# Patient Record
Sex: Female | Born: 1945 | Hispanic: No | Smoking: Former smoker
Health system: Southern US, Community
[De-identification: ages and names within clinical notes are randomized; demographics above are authoritative.]

## PROBLEM LIST (undated history)

## (undated) DIAGNOSIS — M549 Dorsalgia, unspecified: Secondary | ICD-10-CM

## (undated) DIAGNOSIS — M2550 Pain in unspecified joint: Secondary | ICD-10-CM

## (undated) DIAGNOSIS — I428 Other cardiomyopathies: Secondary | ICD-10-CM

## (undated) DIAGNOSIS — I252 Old myocardial infarction: Secondary | ICD-10-CM

## (undated) DIAGNOSIS — K5900 Constipation, unspecified: Secondary | ICD-10-CM

## (undated) DIAGNOSIS — M109 Gout, unspecified: Secondary | ICD-10-CM

## (undated) DIAGNOSIS — E039 Hypothyroidism, unspecified: Secondary | ICD-10-CM

## (undated) DIAGNOSIS — E559 Vitamin D deficiency, unspecified: Secondary | ICD-10-CM

## (undated) DIAGNOSIS — K219 Gastro-esophageal reflux disease without esophagitis: Secondary | ICD-10-CM

## (undated) DIAGNOSIS — M1990 Unspecified osteoarthritis, unspecified site: Secondary | ICD-10-CM

## (undated) DIAGNOSIS — E785 Hyperlipidemia, unspecified: Secondary | ICD-10-CM

## (undated) DIAGNOSIS — R7303 Prediabetes: Secondary | ICD-10-CM

## (undated) DIAGNOSIS — R0602 Shortness of breath: Secondary | ICD-10-CM

## (undated) DIAGNOSIS — C539 Malignant neoplasm of cervix uteri, unspecified: Secondary | ICD-10-CM

## (undated) DIAGNOSIS — C801 Malignant (primary) neoplasm, unspecified: Secondary | ICD-10-CM

## (undated) DIAGNOSIS — I2510 Atherosclerotic heart disease of native coronary artery without angina pectoris: Secondary | ICD-10-CM

## (undated) DIAGNOSIS — Z8489 Family history of other specified conditions: Secondary | ICD-10-CM

## (undated) DIAGNOSIS — I119 Hypertensive heart disease without heart failure: Secondary | ICD-10-CM

## (undated) DIAGNOSIS — G4730 Sleep apnea, unspecified: Secondary | ICD-10-CM

## (undated) DIAGNOSIS — R079 Chest pain, unspecified: Secondary | ICD-10-CM

## (undated) DIAGNOSIS — I480 Paroxysmal atrial fibrillation: Secondary | ICD-10-CM

## (undated) DIAGNOSIS — N189 Chronic kidney disease, unspecified: Secondary | ICD-10-CM

## (undated) HISTORY — DX: Dorsalgia, unspecified: M54.9

## (undated) HISTORY — DX: Old myocardial infarction: I25.2

## (undated) HISTORY — DX: Hyperlipidemia, unspecified: E78.5

## (undated) HISTORY — DX: Prediabetes: R73.03

## (undated) HISTORY — DX: Chest pain, unspecified: R07.9

## (undated) HISTORY — DX: Gout, unspecified: M10.9

## (undated) HISTORY — PX: CARDIAC CATHETERIZATION: SHX172

## (undated) HISTORY — DX: Vitamin D deficiency, unspecified: E55.9

## (undated) HISTORY — DX: Atherosclerotic heart disease of native coronary artery without angina pectoris: I25.10

## (undated) HISTORY — DX: Pain in unspecified joint: M25.50

## (undated) HISTORY — DX: Unspecified osteoarthritis, unspecified site: M19.90

## (undated) HISTORY — DX: Hypothyroidism, unspecified: E03.9

## (undated) HISTORY — DX: Shortness of breath: R06.02

## (undated) HISTORY — DX: Paroxysmal atrial fibrillation: I48.0

## (undated) HISTORY — DX: Constipation, unspecified: K59.00

## (undated) HISTORY — DX: Hypertensive heart disease without heart failure: I11.9

## (undated) HISTORY — DX: Other cardiomyopathies: I42.8

---
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
## 1975-12-11 DIAGNOSIS — C539 Malignant neoplasm of cervix uteri, unspecified: Secondary | ICD-10-CM

## 1975-12-11 HISTORY — DX: Malignant neoplasm of cervix uteri, unspecified: C53.9

## 1986-12-10 HISTORY — PX: ABDOMINAL HYSTERECTOMY: SHX81

## 1992-12-10 HISTORY — PX: BACK SURGERY: SHX140

## 1998-07-07 ENCOUNTER — Emergency Department (HOSPITAL_COMMUNITY): Admission: EM | Admit: 1998-07-07 | Discharge: 1998-07-07 | Payer: Self-pay | Admitting: Emergency Medicine

## 1999-03-10 ENCOUNTER — Ambulatory Visit (HOSPITAL_COMMUNITY): Admission: RE | Admit: 1999-03-10 | Discharge: 1999-03-10 | Payer: Self-pay | Admitting: Cardiology

## 1999-03-10 ENCOUNTER — Encounter: Payer: Self-pay | Admitting: Cardiology

## 2000-04-18 ENCOUNTER — Other Ambulatory Visit: Admission: RE | Admit: 2000-04-18 | Discharge: 2000-04-18 | Payer: Self-pay | Admitting: Internal Medicine

## 2000-10-06 ENCOUNTER — Encounter: Payer: Self-pay | Admitting: Emergency Medicine

## 2000-10-06 ENCOUNTER — Emergency Department (HOSPITAL_COMMUNITY): Admission: EM | Admit: 2000-10-06 | Discharge: 2000-10-07 | Payer: Self-pay | Admitting: Emergency Medicine

## 2000-10-07 ENCOUNTER — Encounter: Payer: Self-pay | Admitting: Emergency Medicine

## 2000-10-07 ENCOUNTER — Ambulatory Visit (HOSPITAL_COMMUNITY): Admission: RE | Admit: 2000-10-07 | Discharge: 2000-10-07 | Payer: Self-pay | Admitting: Internal Medicine

## 2000-10-24 ENCOUNTER — Encounter: Admission: RE | Admit: 2000-10-24 | Discharge: 2000-12-11 | Payer: Self-pay | Admitting: Internal Medicine

## 2001-10-01 ENCOUNTER — Emergency Department (HOSPITAL_COMMUNITY): Admission: EM | Admit: 2001-10-01 | Discharge: 2001-10-01 | Payer: Self-pay | Admitting: Emergency Medicine

## 2001-10-02 ENCOUNTER — Encounter: Payer: Self-pay | Admitting: Internal Medicine

## 2001-10-02 ENCOUNTER — Encounter: Admission: RE | Admit: 2001-10-02 | Discharge: 2001-10-02 | Payer: Self-pay | Admitting: Internal Medicine

## 2001-10-02 ENCOUNTER — Emergency Department (HOSPITAL_COMMUNITY): Admission: EM | Admit: 2001-10-02 | Discharge: 2001-10-02 | Payer: Self-pay | Admitting: Emergency Medicine

## 2002-08-12 ENCOUNTER — Other Ambulatory Visit: Admission: RE | Admit: 2002-08-12 | Discharge: 2002-08-12 | Payer: Self-pay | Admitting: Obstetrics and Gynecology

## 2002-12-01 ENCOUNTER — Encounter: Admission: RE | Admit: 2002-12-01 | Discharge: 2003-03-01 | Payer: Self-pay | Admitting: Obstetrics and Gynecology

## 2003-07-12 ENCOUNTER — Encounter: Payer: Self-pay | Admitting: Cardiology

## 2003-07-12 ENCOUNTER — Ambulatory Visit (HOSPITAL_COMMUNITY): Admission: RE | Admit: 2003-07-12 | Discharge: 2003-07-12 | Payer: Self-pay | Admitting: Cardiology

## 2003-07-14 ENCOUNTER — Encounter: Admission: RE | Admit: 2003-07-14 | Discharge: 2003-07-14 | Payer: Self-pay | Admitting: Cardiology

## 2003-07-14 ENCOUNTER — Encounter: Payer: Self-pay | Admitting: Cardiology

## 2004-08-08 ENCOUNTER — Emergency Department (HOSPITAL_COMMUNITY): Admission: EM | Admit: 2004-08-08 | Discharge: 2004-08-09 | Payer: Self-pay | Admitting: Emergency Medicine

## 2005-05-31 ENCOUNTER — Encounter: Admission: RE | Admit: 2005-05-31 | Discharge: 2005-05-31 | Payer: Self-pay | Admitting: Cardiology

## 2005-09-24 ENCOUNTER — Encounter: Admission: RE | Admit: 2005-09-24 | Discharge: 2005-09-24 | Payer: Self-pay | Admitting: Cardiology

## 2006-06-24 ENCOUNTER — Encounter: Admission: RE | Admit: 2006-06-24 | Discharge: 2006-06-24 | Payer: Self-pay | Admitting: Cardiology

## 2007-01-25 ENCOUNTER — Encounter: Admission: RE | Admit: 2007-01-25 | Discharge: 2007-01-25 | Payer: Self-pay | Admitting: Cardiology

## 2009-01-16 ENCOUNTER — Encounter: Admission: RE | Admit: 2009-01-16 | Discharge: 2009-01-16 | Payer: Self-pay | Admitting: Cardiology

## 2009-01-26 ENCOUNTER — Encounter (HOSPITAL_COMMUNITY): Admission: RE | Admit: 2009-01-26 | Discharge: 2009-04-26 | Payer: Self-pay | Admitting: Cardiology

## 2009-11-24 ENCOUNTER — Encounter (HOSPITAL_COMMUNITY): Admission: RE | Admit: 2009-11-24 | Discharge: 2010-01-10 | Payer: Self-pay | Admitting: Cardiology

## 2009-12-12 ENCOUNTER — Encounter: Admission: RE | Admit: 2009-12-12 | Discharge: 2009-12-12 | Payer: Self-pay | Admitting: Cardiology

## 2010-01-03 ENCOUNTER — Other Ambulatory Visit: Admission: RE | Admit: 2010-01-03 | Discharge: 2010-01-03 | Payer: Self-pay | Admitting: Interventional Radiology

## 2010-01-03 ENCOUNTER — Encounter: Admission: RE | Admit: 2010-01-03 | Discharge: 2010-01-03 | Payer: Self-pay | Admitting: Internal Medicine

## 2010-06-05 ENCOUNTER — Emergency Department (HOSPITAL_COMMUNITY): Admission: EM | Admit: 2010-06-05 | Discharge: 2010-06-06 | Payer: Self-pay | Admitting: Emergency Medicine

## 2010-09-15 ENCOUNTER — Encounter: Admission: RE | Admit: 2010-09-15 | Discharge: 2010-09-15 | Payer: Self-pay | Admitting: Cardiovascular Disease

## 2010-09-19 ENCOUNTER — Ambulatory Visit (HOSPITAL_COMMUNITY): Admission: RE | Admit: 2010-09-19 | Discharge: 2010-09-19 | Payer: Self-pay | Admitting: Cardiovascular Disease

## 2010-12-31 ENCOUNTER — Encounter: Payer: Self-pay | Admitting: Cardiology

## 2011-02-06 ENCOUNTER — Other Ambulatory Visit: Payer: Self-pay | Admitting: Internal Medicine

## 2011-02-06 DIAGNOSIS — E042 Nontoxic multinodular goiter: Secondary | ICD-10-CM

## 2011-02-07 ENCOUNTER — Ambulatory Visit (HOSPITAL_COMMUNITY)
Admission: RE | Admit: 2011-02-07 | Discharge: 2011-02-07 | Disposition: A | Discharge status: Home / Self Care | Payer: Medicare Other | Source: Ambulatory Visit | Attending: Internal Medicine | Admitting: Internal Medicine

## 2011-02-07 DIAGNOSIS — E042 Nontoxic multinodular goiter: Secondary | ICD-10-CM | POA: Insufficient documentation

## 2011-03-12 LAB — LIPID PANEL
Cholesterol: 220 mg/dL — ABNORMAL HIGH (ref 0–200)
HDL: 39 mg/dL — ABNORMAL LOW (ref >39)
LDL Cholesterol: 146 mg/dL — ABNORMAL HIGH (ref 0–99)
Total CHOL/HDL Ratio: 5.6 RATIO
Triglycerides: 175 mg/dL — ABNORMAL HIGH (ref <150)
VLDL: 35 mg/dL (ref 0–40)

## 2011-03-12 LAB — TSH: TSH: 0.525 u[IU]/mL (ref 0.350–4.500)

## 2011-03-12 LAB — T4: T4, Total: 8.6 ug/dL (ref 5.0–12.5)

## 2011-03-12 LAB — T3: T3, Total: 104 ng/dl (ref 80.0–204.0)

## 2012-08-14 ENCOUNTER — Other Ambulatory Visit: Payer: Self-pay | Admitting: Gastroenterology

## 2012-08-14 DIAGNOSIS — R9389 Abnormal findings on diagnostic imaging of other specified body structures: Secondary | ICD-10-CM

## 2012-09-26 ENCOUNTER — Other Ambulatory Visit: Payer: Medicare Other

## 2012-11-24 ENCOUNTER — Other Ambulatory Visit (HOSPITAL_COMMUNITY): Payer: Self-pay | Admitting: Cardiovascular Disease

## 2012-11-24 DIAGNOSIS — I119 Hypertensive heart disease without heart failure: Secondary | ICD-10-CM

## 2012-11-24 DIAGNOSIS — E039 Hypothyroidism, unspecified: Secondary | ICD-10-CM

## 2013-01-15 ENCOUNTER — Ambulatory Visit (HOSPITAL_COMMUNITY)
Admission: RE | Admit: 2013-01-15 | Discharge: 2013-01-15 | Disposition: A | Discharge status: Home / Self Care | Payer: Medicare Other | Source: Ambulatory Visit | Attending: Cardiovascular Disease | Admitting: Cardiovascular Disease

## 2013-01-15 ENCOUNTER — Other Ambulatory Visit: Payer: Self-pay | Admitting: Gastroenterology

## 2013-01-15 ENCOUNTER — Ambulatory Visit (HOSPITAL_COMMUNITY): Payer: Medicare Other

## 2013-01-15 DIAGNOSIS — I119 Hypertensive heart disease without heart failure: Secondary | ICD-10-CM | POA: Insufficient documentation

## 2013-01-15 DIAGNOSIS — I369 Nonrheumatic tricuspid valve disorder, unspecified: Secondary | ICD-10-CM | POA: Insufficient documentation

## 2013-01-15 DIAGNOSIS — E039 Hypothyroidism, unspecified: Secondary | ICD-10-CM

## 2013-01-15 DIAGNOSIS — Z800 Family history of malignant neoplasm of digestive organs: Secondary | ICD-10-CM

## 2013-01-15 DIAGNOSIS — I080 Rheumatic disorders of both mitral and aortic valves: Secondary | ICD-10-CM | POA: Insufficient documentation

## 2013-01-15 HISTORY — PX: TRANSTHORACIC ECHOCARDIOGRAM: SHX275

## 2013-01-15 NOTE — Progress Notes (Signed)
2D Echo Performed 01/15/2013    Latwan Luchsinger, RCS  

## 2013-01-26 ENCOUNTER — Ambulatory Visit
Admission: RE | Admit: 2013-01-26 | Discharge: 2013-01-26 | Disposition: A | Discharge status: Home / Self Care | Payer: Medicare Other | Source: Ambulatory Visit | Attending: Gastroenterology | Admitting: Gastroenterology

## 2013-01-26 ENCOUNTER — Other Ambulatory Visit: Payer: Self-pay | Admitting: Gastroenterology

## 2013-01-26 DIAGNOSIS — Z800 Family history of malignant neoplasm of digestive organs: Secondary | ICD-10-CM

## 2013-05-27 ENCOUNTER — Encounter (INDEPENDENT_AMBULATORY_CARE_PROVIDER_SITE_OTHER): Payer: Self-pay | Admitting: General Surgery

## 2013-05-27 ENCOUNTER — Ambulatory Visit (INDEPENDENT_AMBULATORY_CARE_PROVIDER_SITE_OTHER): Payer: Medicare Other | Admitting: General Surgery

## 2013-05-27 VITALS — BP 128/70 | HR 56 | Temp 97.0°F | Resp 16 | Ht 64.5 in | Wt 218.0 lb

## 2013-05-27 DIAGNOSIS — E669 Obesity, unspecified: Secondary | ICD-10-CM

## 2013-05-27 DIAGNOSIS — E785 Hyperlipidemia, unspecified: Secondary | ICD-10-CM

## 2013-05-27 DIAGNOSIS — E042 Nontoxic multinodular goiter: Secondary | ICD-10-CM

## 2013-05-27 DIAGNOSIS — I10 Essential (primary) hypertension: Secondary | ICD-10-CM

## 2013-05-27 NOTE — Progress Notes (Addendum)
Patient ID: Claudia Cantrell, female   DOB: 1946-11-25, 67 y.o.   MRN: [id number]  Chief Complaint  Patient presents with  . New Evaluation    eval multinodular goiter    HPI Claudia Cantrell is a 67 y.o. female.  She is referred by Dr. Lucianne Muss for evaluation of a large multinodular goiter with pressure symptoms. Dr. Trula Slade is her primary care physician.  She was diagnosed with a multinodular goiter in 2011. She has been having some dysphagia although has never been obstructed or had an impaction. Her voice is normal. She does not have any pain. She does have mild obstructive sleep apnea but doesn't have any wheezing or shortness of breath. Initial ultrasound in 2011 shows 3 cm nodule on the left. Smaller nodule on the left. Hypertrophied isthmus. Nodules were biopsied and are consistent with nonneoplastic goiter. She had a scan which showed some small hot nodule inferiorly on the right side, nonspecific..   Dr. Lucianne Muss does not think she has hyperthyroidism. CT scan at [facility] on 04/04/2011 shows marked enlargement of the left thyroid lobe and isthmus. The right lobe appeared normal. There were multiple hypodense nodular lesions throughout the thyroid. There were large coarse calcifications in the left lower gland. There is marked rightward tracheal deviation with mild transverse narrowing. Left-sided carotid vessels were deviated laterally. There was no adenopathy. The upper mediastinum is unremarkable.  There is no history of radiation therapy to the neck. There is no family history to suggest multiple endocrine neoplasia.  Dr. Lucianne Muss has had her on Synthroid. Her TSH is  slightly low. He does not think thyroid suppression will be effective. He referred her for discussion of surgical intervention.  Comorbidities include mild obesity, hypertension, hyperlipidemia, thoracic vertebral surgery by Dr.Botero. . Total abdominal hysterectomy and BSO.  HPI  Past Medical History  Diagnosis Date  .  Arthritis   . Hyperlipidemia     Past Surgical History  Procedure Laterality Date  . Back surgery  1994  . Abdominal hysterectomy  1988    Family History  Problem Relation Age of Onset  . Heart disease Mother   . Diabetes Father     Social History History  Substance Use Topics  . Smoking status: Former Smoker    Quit date: 12/10/1990  . Smokeless tobacco: Never Used  . Alcohol Use: Yes     Comment: very rare    Allergies  Allergen Reactions  . Codeine Anxiety    Current Outpatient Prescriptions  Medication Sig Dispense Refill  . amLODipine-valsartan (EXFORGE) 10-320 MG per tablet Take 1 tablet by mouth daily.      Marland Kitchen aspirin 81 MG tablet Take 81 mg by mouth daily.      . Multiple Vitamin (MULTIVITAMIN WITH MINERALS) TABS Take 1 tablet by mouth daily.      Marland Kitchen SYNTHROID 50 MCG tablet        No current facility-administered medications for this visit.    Review of Systems Review of Systems  Constitutional: Negative for fever, chills and unexpected weight change.  HENT: Negative for hearing loss, congestion, sore throat, trouble swallowing and voice change.   Eyes: Negative for visual disturbance.  Respiratory: Positive for cough and choking. Negative for wheezing.   Cardiovascular: Negative for chest pain, palpitations and leg swelling.  Gastrointestinal: Negative for nausea, vomiting, abdominal pain, diarrhea, constipation, blood in stool, abdominal distention and anal bleeding.  Genitourinary: Negative for hematuria, vaginal bleeding and difficulty urinating.  Musculoskeletal: Negative for arthralgias.  Skin: Negative for rash and wound.  Neurological: Negative for seizures, syncope and headaches.  Hematological: Negative for adenopathy. Does not bruise/bleed easily.  Psychiatric/Behavioral: Negative for confusion.    Blood pressure 128/70, pulse 56, temperature 97 F (36.1 C), temperature source Temporal, resp. rate 16, height 5' 4.5" (1.638 m), weight 218 lb  (98.884 kg).  Physical Exam Physical Exam  Constitutional: She is oriented to person, place, and time. She appears well-developed and well-nourished. No distress.  HENT:  Head: Normocephalic and atraumatic.  Nose: Nose normal.  Mouth/Throat: No oropharyngeal exudate.  Eyes: Conjunctivae and EOM are normal. Pupils are equal, round, and reactive to light. Left eye exhibits no discharge. No scleral icterus.  Neck: Neck supple. No JVD present. Tracheal deviation present. Thyromegaly present.  Significant thyroid goiter. Most notably centrally in the isthmus and also a left. Trachea is deviated to the right. No adenopathy. The voice is normal. No stridor or wheezing.  Cardiovascular: Normal rate, regular rhythm, normal heart sounds and intact distal pulses.   No murmur heard. Pulmonary/Chest: Effort normal and breath sounds normal. No respiratory distress. She has no wheezes. She has no rales. She exhibits no tenderness.  Abdominal: Soft. Bowel sounds are normal. She exhibits no distension and no mass. There is no tenderness. There is no rebound and no guarding.  Pfannenstiel scar  Musculoskeletal: She exhibits no edema and no tenderness.  Vertical midline incision, midthoracic back. Well-healed.  Lymphadenopathy:    She has no cervical adenopathy.  Neurological: She is alert and oriented to person, place, and time. She exhibits normal muscle tone. Coordination normal.  Skin: Skin is warm. No rash noted. She is not diaphoretic. No erythema. No pallor.  Psychiatric: She has a normal mood and affect. Her behavior is normal. Judgment and thought content normal.    Data Reviewed Ultrasound, CT scan, cytology at, Dr. Remus Blake notes.  Assessment    Significant multinodular goiter with significant thyromegaly left lobe and isthmus causing tracheal deviation deviation and swallowing problems.   Mild obesity.  hypertension  Mild obstructive sleep apnea  Hyperlipidemia  History TAH and BSO     Plan    We had a long talk about her goiter, differential diagnoses, natural history. She is aware that this is probably benign, although I cannot completely exclude carcinoma. She is aware that this will probably continue to enlarge and she will need surgery at some point. She states she would like to have surgery this fall, perhaps in October.  We will tentatively plan to proceed with total thyroidectomy in October of this year.  She'll return to the office for a final preop check in September  I discussed the indications, details, techniques, and numerous risk of the surgery with her. She is aware of the risk of temporary or permanent hoarseness recurrent laryngeal nerve injury. She is aware of the risk of temporary or permanent hypocalcemia for parathyroid injury. Bleeding infection and other wound problems were discussed. All of her questions are answered. She understands all these issues. She agrees with this plan.       Angelia Mould. Derrell Lolling, M.D., [facility] General and Minimally invasive Surgery Breast and Colorectal Surgery Office:   [phone] Pager:   [phone]  05/27/2013, 10:57 AM

## 2013-05-27 NOTE — Patient Instructions (Signed)
You had a very large, multinodular goiter. This is probably not cancerous.  The goiter is causing pressure on your esophagus and trachea, causing some swallowing problems and deviation of the  trachea to the right.  We have discussed the option of surgery, and you have stated sthat youwould like to go ahead with total thyroidectomy in October.  You will be given an appointment to see Dr. Derrell Lolling in early September for a final preop check. At that time we will schedule your surgery at your convenience in October.     Thyroidectomy Thyroidectomy is the removal of part or all of your thyroid gland. Your thyroid gland is a butterfly-shaped gland at the base of your neck. It produces a substance called thyroid hormone, which regulates the physical and chemical processes that keep your body functioning and make energy available to your body (metabolism). The amount of thyroid gland tissue that is removed during a thyroidectomy depends on the reason for the procedure. Typically, if only a part of your gland is removed, enough thyroid gland tissue remains to maintain normal function. If your entire thyroid gland is removed or if the amount of thyroid gland tissue remaining is inadequate to maintain normal function, you will need life-long treatment with thyroid hormone on a daily basis. Thyroidectomy maybe performed when you have the following conditions:  Thyroid nodules. These are small, abnormal collections of tissue that form inside the thyroid gland. If these nodules begin to enlarge at a rapid rate, a sample of tissue from the nodule is taken through a needle and examined (needle biopsy). This is done to determine if the nodules are cancerous. Depending on the outcome of this exam, thyroidectomy may be necessary.  Thyroid cancer.  Goiter, which is an enlarged thyroid gland. All or part of the thyroid gland may be removed if the gland has become so large that it causes difficulty breathing or  swallowing.  Hyperthyroidism. This is when the thyroid gland produces too much thyroid hormone. Hypothyroidism can cause symptoms of fluctuating weight, intolerance to heat, irritability, shortness of breath, and chest pain. LET YOUR CAREGIVER KNOW ABOUT:   Allergies to food or medicine.  Medicines that you are taking, including vitamins, herbs, eyedrops, over-the-counter medicines, and creams.  Previous problems you have had with anesthetics or numbing medicines.  History of bleeding problems or blood clots.  Previous surgeries you have had.  Other health problems, including diabetes and kidney problems, you have had.  Possibility of pregnancy, if this applies. BEFORE THE PROCEDURE   Do not eat or drink anything, including water, for at least 6 hours before the procedure.  Ask your caregiver whether you should stop taking certain medicines before the day of the procedure. PROCEDURE  There are different ways that thyroidectomy is performed. For each type, you will be given a medicine to make you sleep (general anesthetic). The three main types of thyroidectomy are listed as follows:  Conventional thyroidectomy A cut (incision) in the center portion of your lower neck is made with a scalpel. Muscles below your skin are separated to gain access to your thyroid gland. Your thyroid gland is dissected from your windpipe (trachea). Often a drain is placed at the incision site to drain any blood that accumulates under the skin after the procedure. This drain will be removed before you go home. The wound from the incision should heal within 2 weeks.  Endoscopic thyroidectomy Small incisions are made in your lower neck. A small instrument (endoscope) is inserted under  your skin at the incision sites. The endoscope used for thyroidectomy consists of 2 flexible tubes. Inside one of the tubes is a video camera that is used to guide the Careers adviser. Tools to remove the thyroid gland, including a tool to  cut the gland (dissectors) and a suction device, are inserted through the other tube. The surgeon uses the dissectors to dissect the thyroid gland from the trachea and remove it.  Robotic thyroidectomy This procedure allows your thyroid gland to be removed through incisions in your armpit, your chest, or high in your neck. Instruments similar to endoscopes provide a 3-dimensional picture of the surgical site. Dissecting instruments are controlled by devices similar to joysticks. These devices allow more accurate manipulation of the instruments. After the blood supply to the gland is removed, the gland is cut into several pieces and removed through the incisions. RISKS AND COMPLICATIONS Complications associated with thyroidectomy are rare, but they can occur. Possible complications include:  A decrease in parathyroid hormone levels (hypoparathyroidism) Your parathyroid glands are located close behind your thyroid gland. They are responsible for maintaining calcium levels inthe body. If they are damaged or removed, levels of calcium in the blood become low and nerves become irritable, which can cause muscle spasms. Medicines are available to treat this.  Bacterial infection This can often be treated with medicines that kill bacteria (antibiotics).  Damage to your voice box nerves This could cause hoarseness or complete loss of voice.  Bleeding or airway obstruction. AFTER THE PROCEDURE   You will rest in the recovery room as you wake up.  When you first wake up, your throat may feel slightly sore.  You will not be allowed to eat or drink until instructed otherwise.  You will be taken to your hospital room. You will usually stay at the hospital for 1 or 2 nights.  If a drain is placed during the procedure, it usually is removed the next day.  You may have some mild neck pain.  Your voice may be weak. This usually is temporary. Document Released: 05/22/2001 Document Revised: 02/18/2012  Document Reviewed: 02/28/2011 [facility] Patient Information 2014 [place], [place].

## 2013-06-23 ENCOUNTER — Encounter (INDEPENDENT_AMBULATORY_CARE_PROVIDER_SITE_OTHER): Payer: Self-pay

## 2013-07-13 ENCOUNTER — Encounter (INDEPENDENT_AMBULATORY_CARE_PROVIDER_SITE_OTHER): Payer: Self-pay | Admitting: General Surgery

## 2013-07-31 ENCOUNTER — Emergency Department (HOSPITAL_COMMUNITY)
Admission: EM | Admit: 2013-07-31 | Discharge: 2013-07-31 | Disposition: A | Discharge status: Home / Self Care | Payer: PRIVATE HEALTH INSURANCE | Attending: Emergency Medicine | Admitting: Emergency Medicine

## 2013-07-31 ENCOUNTER — Emergency Department (HOSPITAL_COMMUNITY): Payer: PRIVATE HEALTH INSURANCE

## 2013-07-31 ENCOUNTER — Encounter (HOSPITAL_COMMUNITY): Payer: Self-pay | Admitting: *Deleted

## 2013-07-31 DIAGNOSIS — Y9389 Activity, other specified: Secondary | ICD-10-CM | POA: Insufficient documentation

## 2013-07-31 DIAGNOSIS — S93401A Sprain of unspecified ligament of right ankle, initial encounter: Secondary | ICD-10-CM

## 2013-07-31 DIAGNOSIS — Z7982 Long term (current) use of aspirin: Secondary | ICD-10-CM | POA: Insufficient documentation

## 2013-07-31 DIAGNOSIS — Z87891 Personal history of nicotine dependence: Secondary | ICD-10-CM | POA: Insufficient documentation

## 2013-07-31 DIAGNOSIS — E785 Hyperlipidemia, unspecified: Secondary | ICD-10-CM | POA: Insufficient documentation

## 2013-07-31 DIAGNOSIS — M1990 Unspecified osteoarthritis, unspecified site: Secondary | ICD-10-CM | POA: Insufficient documentation

## 2013-07-31 DIAGNOSIS — Y92009 Unspecified place in unspecified non-institutional (private) residence as the place of occurrence of the external cause: Secondary | ICD-10-CM | POA: Insufficient documentation

## 2013-07-31 DIAGNOSIS — X500XXA Overexertion from strenuous movement or load, initial encounter: Secondary | ICD-10-CM | POA: Insufficient documentation

## 2013-07-31 DIAGNOSIS — S93409A Sprain of unspecified ligament of unspecified ankle, initial encounter: Secondary | ICD-10-CM | POA: Insufficient documentation

## 2013-07-31 DIAGNOSIS — Z79899 Other long term (current) drug therapy: Secondary | ICD-10-CM | POA: Insufficient documentation

## 2013-07-31 MED ORDER — TRAMADOL HCL 50 MG PO TABS: 50.0000 mg | ORAL_TABLET | Freq: Four times a day (QID) | ORAL | Status: DC | PRN

## 2013-07-31 MED ORDER — IBUPROFEN 800 MG PO TABS
800.0000 mg | ORAL_TABLET | Freq: Once | ORAL | Status: AC
  Administered 2013-07-31: 800 mg via ORAL
  Filled 2013-07-31: qty 1

## 2013-07-31 NOTE — ED Notes (Signed)
Pt states she inverted her foot while getting out of bed, and hurt a "crunch". Pt walked on foot all day and awoke from sleep with the pain this am.  R lat ankle swollen and painful on palpation.  No discoloration noted.

## 2013-07-31 NOTE — ED Provider Notes (Signed)
CSN: [id number]     Arrival date & time 07/31/13  7829 History     First MD Initiated Contact with Patient 07/31/13 [phone]     No chief complaint on file.  (Consider location/radiation/quality/duration/timing/severity/associated sxs/prior Treatment) HPI  67 year old female presents for evaluations of ankle pain. Patient states she hopped out of bed yesterday morning to go to the bathroom. State she accidentally placed all of her weight on her right ankle and experienced an acute onset of sharp throbbing pain to the affected ankle. Pain initially 6/10. She was able to bear some weight on it throughout the day but this morning she was unable to bear any weight. Pain is currently 10 out of 10, sharp and throbbing, radiates up to her lower leg. She tries taking an aspirin, and use warm water with minimal improvement. She denies any pain to the hip, or knee she denies any numbness or weakness. No prior history of gout. Denies fever, chills, or rash. Patient drove herself here.    Past Medical History  Diagnosis Date  . Arthritis   . Hyperlipidemia    Past Surgical History  Procedure Laterality Date  . Back surgery  1994  . Abdominal hysterectomy  1988   Family History  Problem Relation Age of Onset  . Heart disease Mother   . Diabetes Father    History  Substance Use Topics  . Smoking status: Former Smoker    Quit date: 12/10/1990  . Smokeless tobacco: Never Used  . Alcohol Use: Yes     Comment: very rare   OB History   Grav Para Term Preterm Abortions TAB SAB Ect Mult Living                 Review of Systems  Constitutional: Negative for fever.  Musculoskeletal: Positive for joint swelling.  Neurological: Negative for numbness.    Allergies  Codeine  Home Medications   Current Outpatient Rx  Name  Route  Sig  Dispense  Refill  . amLODipine-valsartan (EXFORGE) 10-320 MG per tablet   Oral   Take 1 tablet by mouth daily.         Marland Kitchen aspirin 81 MG tablet   Oral  Take 81 mg by mouth daily.         . Multiple Vitamin (MULTIVITAMIN WITH MINERALS) TABS   Oral   Take 1 tablet by mouth daily.         Marland Kitchen SYNTHROID 50 MCG tablet                There were no vitals taken for this visit. Physical Exam  Nursing note and vitals reviewed. Constitutional: She appears well-nourished. No distress.  HENT:  Head: Atraumatic.  Eyes: Conjunctivae are normal.  Neck: Neck supple.  Musculoskeletal: She exhibits tenderness (Right ankle. Point tenderness overlying the lateral malleolus region with mild edema noted no hematoma. Decreased range of motion due to pain. Intact DP/PT pulses. Able to move all toes, with brisk cap refill. No obvious deformity).  Neurological: She is alert.  Skin: Skin is warm. No rash noted.  Psychiatric: She has a normal mood and affect.    ED Course   Procedures (including critical care time)  Patient presents with right ankle injury. Likely a sprain. However patient requests full x-ray. X-ray will obtain. Ibuprofen given for pain. Patient is otherwise neurovascularly intact. Low suspicion for gout, or septic arthritis.  7:56 AM X-ray shows soft tissue swelling without evidence of acute fractures or dislocation. Patient  will be given an ASO for ankle stability, she also requests a set of crutches which I'm happy to give. Instruction for appropriate crutches use given. Rice therapy discussed. Pain medication prescribed. Ortho follow as needed. Return precautions given.  Labs Reviewed - No data to display Dg Ankle Complete Right  07/31/2013   *RADIOLOGY REPORT*  Clinical Data: Recent ankle injury and pain  RIGHT ANKLE - COMPLETE 3+ VIEW  Comparison: None.  Findings: Generalized soft tissue swelling is noted but more marked in the lateral ankle.  No acute fracture or dislocation is seen.  IMPRESSION: Soft tissue swelling without acute bony abnormality.   Original Report Authenticated By: Alcide Clever, M.D.   1. Ankle sprain, right,  initial encounter     MDM  BP 153/84  Pulse 72  Temp(Src) 99.3 F (37.4 C) (Oral)  Resp 18  Ht 5\' 5"  (1.651 m)  SpO2 96%  I have reviewed nursing notes and vital signs. I personally reviewed the imaging tests through PACS system  I reviewed available ER/hospitalization records thought the EMR   Fayrene Helper, [place] 07/31/13 [id number]

## 2013-07-31 NOTE — ED Notes (Signed)
Ortho paged. 

## 2013-08-05 NOTE — ED Provider Notes (Signed)
Medical screening examination/treatment/procedure(s) were performed by non-physician practitioner and as supervising physician I was immediately available for consultation/collaboration.    Celene Kras, MD 08/05/13 [phone]

## 2013-10-05 ENCOUNTER — Ambulatory Visit (INDEPENDENT_AMBULATORY_CARE_PROVIDER_SITE_OTHER): Payer: Medicare Other | Admitting: General Surgery

## 2013-10-06 ENCOUNTER — Encounter (INDEPENDENT_AMBULATORY_CARE_PROVIDER_SITE_OTHER): Payer: Self-pay | Admitting: General Surgery

## 2013-10-08 ENCOUNTER — Ambulatory Visit: Payer: Medicare Other | Admitting: Cardiovascular Disease

## 2013-10-09 ENCOUNTER — Ambulatory Visit (INDEPENDENT_AMBULATORY_CARE_PROVIDER_SITE_OTHER): Payer: No Typology Code available for payment source | Admitting: Cardiovascular Disease

## 2013-10-09 ENCOUNTER — Encounter: Payer: Self-pay | Admitting: Cardiovascular Disease

## 2013-10-09 VITALS — BP 138/80 | HR 69 | Ht 65.0 in | Wt 219.2 lb

## 2013-10-09 DIAGNOSIS — I2510 Atherosclerotic heart disease of native coronary artery without angina pectoris: Secondary | ICD-10-CM

## 2013-10-09 DIAGNOSIS — G4733 Obstructive sleep apnea (adult) (pediatric): Secondary | ICD-10-CM

## 2013-10-09 DIAGNOSIS — E785 Hyperlipidemia, unspecified: Secondary | ICD-10-CM

## 2013-10-09 DIAGNOSIS — E669 Obesity, unspecified: Secondary | ICD-10-CM

## 2013-10-09 DIAGNOSIS — E042 Nontoxic multinodular goiter: Secondary | ICD-10-CM

## 2013-10-09 DIAGNOSIS — I10 Essential (primary) hypertension: Secondary | ICD-10-CM

## 2013-10-09 NOTE — Progress Notes (Signed)
Patient ID: Claudia Cantrell, female   DOB: Feb 10, 1946, 67 y.o.   MRN: [id number]     HPI: Claudia Cantrell is a 67 y.o. female who presents to the office today for one-year cardiology evaluation.  Mrs. Cassata suffered remote myocardial infarctions in 1991 and 1992 and at that time underwent PTCA by Dr. Meryl Crutch.  She does have a history of hypertension, hyperlipidemia, obstructive sleep apnea on CPAP therapy, obesity, and hypothyroidism with multinodular goiter. In October 2011 she underwent cardiac catheterization after she had experienced episodes of chest pain. Catheterization showed preserved LV contractility although the was a very small focal area of severe hypocontractility involving the mid inferior wall as well as low posterior lateral wall in this patient status post remote myocardial infarction approximately 20 years ago treated with PTCA. At the time of her catheterization she did not have significant carotid obstructive disease and only had mild 10-20% narrowing at the ostium of the LAD, 20% irregularity in the mid AV groove circumflex, and 20% mid RCA and 20% posterior lateral irregularities. Subsequently, she has done well from a cardiovascular standpoint.  She tells me her multinodular goiter has increased. She has seen Dr. Delane Ginger and may require surgery. From a cardiac standpoint he has done well. She denies recurrent chest pain or shortness of breath. With reference to her obstructive sleep apnea she uses her CPAP therapy 100% of the time. She tells me she recently had completes a laboratory drawn by Dr.Jerome Spruil.  Past Medical History  Diagnosis Date  . Hyperlipidemia   . Arthritis     rheumatoid    Past Surgical History  Procedure Laterality Date  . Back surgery  1994  . Abdominal hysterectomy  1988    Allergies  Allergen Reactions  . Codeine Anxiety    Current Outpatient Prescriptions  Medication Sig Dispense Refill  . amLODipine (NORVASC) 10 MG tablet  Take 1 tablet by mouth daily.      Marland Kitchen aspirin 81 MG tablet Take 81 mg by mouth daily.      Marland Kitchen BIOTIN PO Take 1 tablet by mouth daily. 10,000 mcg      . Multiple Vitamin (MULTIVITAMIN WITH MINERALS) TABS Take 1 tablet by mouth daily.      . naproxen sodium (ANAPROX) 220 MG tablet Take 220 mg by mouth as needed.      . rosuvastatin (CRESTOR) 20 MG tablet Take 20 mg by mouth daily.      . valsartan-hydrochlorothiazide (DIOVAN-HCT) 320-12.5 MG per tablet Take 1 tablet by mouth daily.       No current facility-administered medications for this visit.    History   Social History  . Marital Status: Divorced    Spouse Name: N/A    Number of Children: N/A  . Years of Education: N/A   Occupational History  . Not on file.   Social History Main Topics  . Smoking status: Former Smoker    Quit date: 12/10/1990  . Smokeless tobacco: Never Used  . Alcohol Use: Yes     Comment: very rare  . Drug Use: No  . Sexual Activity: Not on file   Other Topics Concern  . Not on file   Social History Narrative  . No narrative on file    Family History  Problem Relation Age of Onset  . Heart disease Mother   . Diabetes Father    Social history is notable that she is widowed and has 2 children plus one adopted child  as well as 2 grandchildren. She does not routinely exercise. There is no tobacco or alcohol use.  ROS is negative for fevers, chills or night sweats. She denies visual changes. She denies skin lesions. She denies shortness of breath. She does have slightly enlarging Courter with multinodular and she tells me they're now 9 nodules. She is unaware of palpitations. She denies wheezing. No sputum production or cough. She denies chest pain or anginal symptoms. She does have moderate obesity denies weight gain and admits to approximately 5 pound weight loss. She denies change in bowel bladder habits. She denies blood in her stool or urine. She denies claudication. She denies bulges. There is no  known diabetes. She does have mild hyperlipidemia. She has hypertension asthma triple drug therapy. She denies neurologic symptoms. Other comprehensive 12 point  system review is negative.  PE BP 138/80  Pulse 69  Ht 5\' 5"  (1.651 m)  Wt 219 lb 3.2 oz (99.428 kg)  BMI 36.48 kg/m2  General: Alert, oriented, no distress. Moderate obesity Skin: normal turgor, no rashes HEENT: Normocephalic, atraumatic. Pupils round and reactive; sclera anicteric;no lid lag.  Nose without nasal septal hypertrophy Mouth/Parynx benign; Mallinpatti scale 3 Neck: No JVD, no carotid briuts; multinodular palpable goiter Lungs: clear to ausculatation and percussion; no wheezing or rales Heart: RRR, s1 s2 normal 1/6 sem Abdomen: soft, nontender; no hepatosplenomehaly, BS+; abdominal aorta nontender and not dilated by palpation. Pulses 2+ Extremities: no clubbing cyanosis or edema, Homan's sign negative  Neurologic: grossly nonfocal Psychologic: normal affect and mood.  ECG: Sinus rhythm at 69 beats per minute. Normal intervals. Nonspecific T changes.  LABS:  BMET No results found for this basename: na, k, cl, co2, glucose, bun, creatinine, calcium, gfrnonaa, gfraa     Hepatic Function Panel  No results found for this basename: prot, albumin, ast, alt, alkphos, bilitot, bilidir, ibili     CBC No results found for this basename: wbc, rbc, hgb, hct, plt, mcv, mch, mchc, rdw, neutrabs, lymphsabs, monoabs, eosabs, basosabs     BNP No results found for this basename: probnp    Lipid Panel     Component Value Date/Time   CHOL  Value: 220        ATP III CLASSIFICATION:  <200     mg/dL   Desirable  409-811  mg/dL   Borderline High  >=914    mg/dL   High        HEMOLYZED SPECIMEN, RESULTS MAY BE AFFECTED* 11/24/2009 1208   TRIG 175* 11/24/2009 1208   HDL 39* 11/24/2009 1208   CHOLHDL 5.6 11/24/2009 1208   VLDL 35 11/24/2009 1208   LDLCALC  Value: 146        Total Cholesterol/HDL:CHD Risk Coronary Heart  Disease Risk Table                     Men   Women  1/2 Average Risk   3.4   3.3  Average Risk       5.0   4.4  2 X Average Risk   9.6   7.1  3 X Average Risk  23.4   11.0        Use the calculated Patient Ratio above and the CHD Risk Table to determine the patient's CHD Risk.        ATP III CLASSIFICATION (LDL):  <100     mg/dL   Optimal  782-956  mg/dL   Near or Above  Optimal  130-159  mg/dL   Borderline  161-096  mg/dL   High  >045     mg/dL   Very High* 40/98/1191 1208     RADIOLOGY: No results found.    ASSESSMENT AND PLAN:  Ms. Roise Emert is 67 year-old [place] American female who apparently suffered a remote myocardial infarction approximately 24 years ago and underwent PTCA by Dr. Daisy Floro. At her last catheterization in 2011 she did have a small focal wall motion abnormality in the inferior and posterior lateral wall concordant with his myocardial infarction. She has mild nonobstructive CAD. She or her blood pressure is well-controlled. She is on lipid-lowering therapy for cholesterol. She is 100% compliant with reference to obstructive sleep apnea and CPAP use. She has seen Dr. Derrell Lolling and is under consideration for surgery for multinodular increasing goiter.  I have given her clearance for cardiac standpoint to undergo the surgery. He'll try to obtain the results of the recent laboratory done by Dr. Shana Chute. I will see her in one year for cardiology reevaluation.    Lennette Bihari, MD, [facility]  10/09/2013 10:48 AM

## 2013-10-09 NOTE — Patient Instructions (Signed)
Your physician recommends that you schedule a follow-up appointment in: 1 YEAR. No changes were made today in your therapy. 

## 2013-10-13 ENCOUNTER — Encounter (INDEPENDENT_AMBULATORY_CARE_PROVIDER_SITE_OTHER): Payer: Self-pay | Admitting: General Surgery

## 2013-10-13 ENCOUNTER — Other Ambulatory Visit (INDEPENDENT_AMBULATORY_CARE_PROVIDER_SITE_OTHER): Payer: Self-pay | Admitting: General Surgery

## 2013-10-13 ENCOUNTER — Ambulatory Visit (INDEPENDENT_AMBULATORY_CARE_PROVIDER_SITE_OTHER): Payer: Medicare Other | Admitting: General Surgery

## 2013-10-13 ENCOUNTER — Encounter: Payer: Self-pay | Admitting: Cardiovascular Disease

## 2013-10-13 VITALS — BP 118/80 | HR 74 | Temp 98.0°F | Resp 16 | Ht 65.0 in | Wt 219.0 lb

## 2013-10-13 DIAGNOSIS — E042 Nontoxic multinodular goiter: Secondary | ICD-10-CM

## 2013-10-13 NOTE — Patient Instructions (Signed)
Your cardiac evaluation with Dr. Tresa Endo turned out very good. He said that we could go ahead with the thyroid surgery.  You will be scheduled for a total thyroidectomy at [facility] in the near future.  You will need to take thyroid hormone for the rest of your life following the surgery. Dr. Lucianne Muss and Dr. Nehemiah Settle will help you with regulating that.  Be sure to bring your CPAP machine to the hospital with you so it can be used immediately postop.      Thyroidectomy Thyroidectomy is the removal of part or all of your thyroid gland. Your thyroid gland is a butterfly-shaped gland at the base of your neck. It produces a substance called thyroid hormone, which regulates the physical and chemical processes that keep your body functioning and make energy available to your body (metabolism). The amount of thyroid gland tissue that is removed during a thyroidectomy depends on the reason for the procedure. Typically, if only a part of your gland is removed, enough thyroid gland tissue remains to maintain normal function. If your entire thyroid gland is removed or if the amount of thyroid gland tissue remaining is inadequate to maintain normal function, you will need life-long treatment with thyroid hormone on a daily basis. Thyroidectomy maybe performed when you have the following conditions:  Thyroid nodules. These are small, abnormal collections of tissue that form inside the thyroid gland. If these nodules begin to enlarge at a rapid rate, a sample of tissue from the nodule is taken through a needle and examined (needle biopsy). This is done to determine if the nodules are cancerous. Depending on the outcome of this exam, thyroidectomy may be necessary.  Thyroid cancer.  Goiter, which is an enlarged thyroid gland. All or part of the thyroid gland may be removed if the gland has become so large that it causes difficulty breathing or swallowing.  Hyperthyroidism. This is when the thyroid gland  produces too much thyroid hormone. Hypothyroidism can cause symptoms of fluctuating weight, intolerance to heat, irritability, shortness of breath, and chest pain. LET YOUR CAREGIVER KNOW ABOUT:   Allergies to food or medicine.  Medicines that you are taking, including vitamins, herbs, eyedrops, over-the-counter medicines, and creams.  Previous problems you have had with anesthetics or numbing medicines.  History of bleeding problems or blood clots.  Previous surgeries you have had.  Other health problems, including diabetes and kidney problems, you have had.  Possibility of pregnancy, if this applies. BEFORE THE PROCEDURE   Do not eat or drink anything, including water, for at least 6 hours before the procedure.  Ask your caregiver whether you should stop taking certain medicines before the day of the procedure. PROCEDURE  There are different ways that thyroidectomy is performed. For each type, you will be given a medicine to make you sleep (general anesthetic). The three main types of thyroidectomy are listed as follows:  Conventional thyroidectomy A cut (incision) in the center portion of your lower neck is made with a scalpel. Muscles below your skin are separated to gain access to your thyroid gland. Your thyroid gland is dissected from your windpipe (trachea). Often a drain is placed at the incision site to drain any blood that accumulates under the skin after the procedure. This drain will be removed before you go home. The wound from the incision should heal within 2 weeks.  Endoscopic thyroidectomy Small incisions are made in your lower neck. A small instrument (endoscope) is inserted under your skin at the incision  sites. The endoscope used for thyroidectomy consists of 2 flexible tubes. Inside one of the tubes is a video camera that is used to guide the Careers adviser. Tools to remove the thyroid gland, including a tool to cut the gland (dissectors) and a suction device, are inserted  through the other tube. The surgeon uses the dissectors to dissect the thyroid gland from the trachea and remove it.  Robotic thyroidectomy This procedure allows your thyroid gland to be removed through incisions in your armpit, your chest, or high in your neck. Instruments similar to endoscopes provide a 3-dimensional picture of the surgical site. Dissecting instruments are controlled by devices similar to joysticks. These devices allow more accurate manipulation of the instruments. After the blood supply to the gland is removed, the gland is cut into several pieces and removed through the incisions. RISKS AND COMPLICATIONS Complications associated with thyroidectomy are rare, but they can occur. Possible complications include:  A decrease in parathyroid hormone levels (hypoparathyroidism) Your parathyroid glands are located close behind your thyroid gland. They are responsible for maintaining calcium levels inthe body. If they are damaged or removed, levels of calcium in the blood become low and nerves become irritable, which can cause muscle spasms. Medicines are available to treat this.  Bacterial infection This can often be treated with medicines that kill bacteria (antibiotics).  Damage to your voice box nerves This could cause hoarseness or complete loss of voice.  Bleeding or airway obstruction. AFTER THE PROCEDURE   You will rest in the recovery room as you wake up.  When you first wake up, your throat may feel slightly sore.  You will not be allowed to eat or drink until instructed otherwise.  You will be taken to your hospital room. You will usually stay at the hospital for 1 or 2 nights.  If a drain is placed during the procedure, it usually is removed the next day.  You may have some mild neck pain.  Your voice may be weak. This usually is temporary. Document Released: 05/22/2001 Document Revised: 02/18/2012 Document Reviewed: 02/28/2011 [facility] Patient Information 2014  [place], [place].      Thyroidectomy Care After Refer to this sheet in the next few weeks. These instructions provide you with general information on caring for yourself after you leave the hospital. Your caregiver also may give you specific instructions. Your treatment has been planned according to the most current medical practices available, but problems sometimes occur. Call your caregiver if you have any problems or questions after your procedure. HOME CARE INSTRUCTIONS   It is normal to be sore for a few weeks following surgery. See your caregiver if your pain seems to be getting worse rather than better.  Only take over-the-counter or prescription medicines for pain, discomfort, or fever as directed by your caregiver. Avoid taking medicines that contain aspirin and ibuprofen because they increase the risk of bleeding.  Shower rather than bathe until instructed otherwise by your caregiver.  Change your bandages (dressings) as directed by your caregiver.  You may resume a normal diet and activities as directed by your caregiver.  Avoid lifting weight greater than 20 lb (9 kg) or participating in heavy exercise or contact sports for 10 days or as instructed by your caregiver.  Make an appointment to see your caregiver for stitch (suture) or staple removal. SEEK MEDICAL CARE IF:   You have increased bleeding from your wound.  You have redness, swelling, or increasing pain from your wound or in your neck.  There is pus coming from your wound.  You have an oral temperature above 102 F (38.9 C).  There is a bad smell coming from the wound or dressing.  You develop lightheadedness or feel faint.  You develop numbness, tingling, or muscle spasms in your arms, hands, feet, or face.  You have difficulty swallowing. SEEK IMMEDIATE MEDICAL CARE IF:   You develop a rash.  You have difficulty breathing.  You hear whistling noises that come from your chest.  You develop a cough  that becomes increasingly worse.  You develop any reaction or side effects to medicines given.  There is swelling in your neck.  You develop changes in speech or hoarseness, which is getting worse. MAKE SURE YOU:   Understand these instructions.  Will watch your condition.  Will get help right away if you are not doing well or get worse. Document Released: 06/15/2005 Document Revised: 02/18/2012 Document Reviewed: 02/02/2011 [facility] Patient Information 2014 [place], [place].

## 2013-10-13 NOTE — Progress Notes (Signed)
Patient ID: Claudia Cantrell, female   DOB: 1946-09-05, 67 y.o.   MRN: [id number]  Chief Complaint  Patient presents with  . Follow-up    discuss goiter sx    HPI Claudia Cantrell is a 67 y.o. female.  She returns for surgical planning of her thyroidectomy.  Her initial evaluation is summarized as follows:    She was referred by Dr. Lucianne Muss for evaluation of a large multinodular goiter with pressure symptoms. Dr. Trula Slade is her primary care physician.  She was diagnosed with a multinodular goiter in 2011. She has been having some dysphagia although has never been obstructed or had an impaction. Her voice is normal. She does not have any pain. She does have mild obstructive sleep apnea but doesn't have any wheezing or shortness of breath. Initial ultrasound in 2011 shows 3 cm nodule on the left. Smaller nodule on the left. Hypertrophied isthmus. Nodules were biopsied and are consistent with nonneoplastic goiter. She had a scan which showed some small hot nodule inferiorly on the right side, nonspecific.. Dr. Lucianne Muss does not think she has hyperthyroidism. CT scan at [facility] on 04/04/2011 shows marked enlargement of the left thyroid lobe and isthmus. The right lobe appeared normal. There were multiple hypodense nodular lesions throughout the thyroid. There were large coarse calcifications in the left lower gland. There is marked rightward tracheal deviation with mild transverse narrowing. Left-sided carotid vessels were deviated laterally. There was no adenopathy. The upper mediastinum is unremarkable.  There is no history of radiation therapy to the neck. There is no family history to suggest multiple endocrine neoplasia.  Dr. Lucianne Muss has stopped her Synthroid. Her TSH is slightly low. He does not think thyroid suppression will be effective. He referred her for discussion of surgical intervention.  Comorbidities include mild obesity, hypertension, hyperlipidemia, thoracic vertebral surgery by Dr.Botero. .  Total abdominal hysterectomy and BSO.   She has been evaluated by Dr. Nicki Guadalajara. He reviewed her history of myocardial infarction in 1990 and 1992 with PTCA by Dr. Daisy Floro. She has no cardiac symptoms. He cleared her for the thyroid surgery. She has also seen Dr. Shana Chute who encouraged her to go ahead with thyroid surgery.  Her symptoms or assistance same. No real voice changes but she does have dysphagia. She is concerned about the deviation of the trachea to the right.   HPI  Past Medical History  Diagnosis Date  . Hyperlipidemia   . Arthritis     rheumatoid    Past Surgical History  Procedure Laterality Date  . Back surgery  1994  . Abdominal hysterectomy  1988    Family History  Problem Relation Age of Onset  . Heart disease Mother   . Diabetes Father     Social History History  Substance Use Topics  . Smoking status: Former Smoker    Quit date: 12/10/1990  . Smokeless tobacco: Never Used  . Alcohol Use: Yes     Comment: very rare    Allergies  Allergen Reactions  . Codeine Anxiety    Current Outpatient Prescriptions  Medication Sig Dispense Refill  . amLODipine (NORVASC) 10 MG tablet Take 1 tablet by mouth daily.      Marland Kitchen aspirin 81 MG tablet Take 81 mg by mouth daily.      Marland Kitchen BIOTIN PO Take 1 tablet by mouth daily. 10,000 mcg      . Multiple Vitamin (MULTIVITAMIN WITH MINERALS) TABS Take 1 tablet by mouth daily.      Marland Kitchen  naproxen sodium (ANAPROX) 220 MG tablet Take 220 mg by mouth as needed.      . rosuvastatin (CRESTOR) 20 MG tablet Take 20 mg by mouth daily.      . valsartan-hydrochlorothiazide (DIOVAN-HCT) 320-12.5 MG per tablet Take 1 tablet by mouth daily.       No current facility-administered medications for this visit.    Review of Systems Review of Systems  Constitutional: Negative for fever, chills and unexpected weight change.  HENT: Negative for congestion, hearing loss, sore throat, trouble swallowing and voice change.   Eyes: Negative for  visual disturbance.  Respiratory: Negative for cough and wheezing.   Cardiovascular: Negative for chest pain, palpitations and leg swelling.  Gastrointestinal: Negative for nausea, vomiting, abdominal pain, diarrhea, constipation, blood in stool, abdominal distention and anal bleeding.  Genitourinary: Negative for hematuria, vaginal bleeding and difficulty urinating.  Musculoskeletal: Negative for arthralgias.  Skin: Negative for rash and wound.  Neurological: Negative for seizures, syncope and headaches.  Hematological: Negative for adenopathy. Does not bruise/bleed easily.  Psychiatric/Behavioral: Negative for confusion. The patient is nervous/anxious.     Blood pressure 118/80, pulse 74, temperature 98 F (36.7 C), temperature source Temporal, resp. rate 16, height 5\' 5"  (1.651 m), weight 219 lb (99.338 kg).  Physical Exam Physical Exam  Constitutional: She is oriented to person, place, and time. She appears well-developed and well-nourished. No distress.  BMI 36.44  HENT:  Head: Normocephalic and atraumatic.  Nose: Nose normal.  Mouth/Throat: No oropharyngeal exudate.  Eyes: Conjunctivae and EOM are normal. Pupils are equal, round, and reactive to light. Left eye exhibits no discharge. No scleral icterus.  Neck: Neck supple. No JVD present. Tracheal deviation present. Thyromegaly present.  Significant thyroid goiter. Most notable centrally in the isthmus also the left. Trachea deviated to right. No adenopathy. Voice is normal. No stridor. The bladder does not appear to have a substernal extension.  Cardiovascular: Normal rate, regular rhythm, normal heart sounds and intact distal pulses.   No murmur heard. Pulmonary/Chest: Effort normal and breath sounds normal. No respiratory distress. She has no wheezes. She has no rales. She exhibits no tenderness.  Abdominal: Soft. Bowel sounds are normal. She exhibits no distension and no mass. There is no tenderness. There is no rebound and no  guarding.  Pfannenstiel scar.  Musculoskeletal: She exhibits no edema and no tenderness.  Vertical midline incisional midthoracic back. Well-healed.  Lymphadenopathy:    She has no cervical adenopathy.  Neurological: She is alert and oriented to person, place, and time. She exhibits normal muscle tone. Coordination normal.  Skin: Skin is warm. No rash noted. She is not diaphoretic. No erythema. No pallor.  Psychiatric: She has a normal mood and affect. Her behavior is normal. Judgment and thought content normal.    Data Reviewed My notes. [facility] notes. Ultrasound. CT scan. Cytology.  Assessment    Significant multinodular goiter with significant thyromegaly left lobe and isthmus causing tracheal deviation deviation and swallowing problems.   Mild obesity.  hypertension  Mild obstructive sleep apnea  Hyperlipidemia  History TAH and BSO     Plan    We had another long talk about her goiter, differential diagnosis, natural history. She is aware that this is most likely benign although we cannot completely exclude carcinoma without removal. She knows that she needs to go ahead with surgery and will like to have this done. She is ready to schedule.  We will schedule for total thyroidectomy at [facility] hospital.   I discussed  the indications, details, techniques, and numerous risk of the surgery with her. She is aware of the risk of temporary or permanent hoarseness from recurrent laryngeal nerve injury. She is aware of the risk of temporary or permanent hypocalcemia for parathyroid injury. Bleeding, infection and other wound problems were discussed. All of her questions are answered. She understands all these issues. She agrees with this plan.        Angelia Mould. Derrell Lolling, M.D., [facility] General and Minimally invasive Surgery Breast and Colorectal Surgery Office:   [phone] Pager:   [phone]  10/13/2013, 1:54 PM

## 2013-11-09 ENCOUNTER — Telehealth: Payer: Self-pay | Admitting: Cardiovascular Disease

## 2013-11-09 NOTE — Telephone Encounter (Signed)
Please call-she said [facility] have not received the paperwork so she can get her C-Pac machine.

## 2013-11-09 NOTE — Telephone Encounter (Signed)
Please advise 

## 2013-11-10 ENCOUNTER — Telehealth: Payer: Self-pay | Admitting: *Deleted

## 2013-11-10 NOTE — Telephone Encounter (Signed)
Informed patient the referral has been done to [facility] to manage her CPAP therapy and supplies.

## 2013-11-10 NOTE — Telephone Encounter (Signed)
Spoke with patient to see if i can assist her. She informs me that she was trying to get her CPAP supplies from [facility] and was told that her paperwork has not been sent over. I left message for Tamela Oddi to check to see what Is needed for patient to get her supplies.

## 2013-11-10 NOTE — Telephone Encounter (Signed)
Faxed referral to manage patient's CPAP and supplies.

## 2013-11-12 ENCOUNTER — Encounter (HOSPITAL_COMMUNITY): Payer: Self-pay | Admitting: Pharmacy Technician

## 2013-11-17 ENCOUNTER — Ambulatory Visit (HOSPITAL_COMMUNITY)
Admission: RE | Admit: 2013-11-17 | Discharge: 2013-11-17 | Disposition: A | Discharge status: Home / Self Care | Payer: PRIVATE HEALTH INSURANCE | Source: Ambulatory Visit | Attending: General Surgery | Admitting: General Surgery

## 2013-11-17 ENCOUNTER — Encounter (HOSPITAL_COMMUNITY)
Admission: RE | Admit: 2013-11-17 | Discharge: 2013-11-17 | Disposition: A | Discharge status: Home / Self Care | Payer: PRIVATE HEALTH INSURANCE | Source: Ambulatory Visit | Attending: General Surgery | Admitting: General Surgery

## 2013-11-17 ENCOUNTER — Encounter (HOSPITAL_COMMUNITY): Payer: Self-pay

## 2013-11-17 DIAGNOSIS — I517 Cardiomegaly: Secondary | ICD-10-CM | POA: Insufficient documentation

## 2013-11-17 DIAGNOSIS — J398 Other specified diseases of upper respiratory tract: Secondary | ICD-10-CM | POA: Insufficient documentation

## 2013-11-17 DIAGNOSIS — Z01818 Encounter for other preprocedural examination: Secondary | ICD-10-CM | POA: Insufficient documentation

## 2013-11-17 DIAGNOSIS — Z01812 Encounter for preprocedural laboratory examination: Secondary | ICD-10-CM | POA: Insufficient documentation

## 2013-11-17 HISTORY — DX: Gastro-esophageal reflux disease without esophagitis: K21.9

## 2013-11-17 HISTORY — DX: Sleep apnea, unspecified: G47.30

## 2013-11-17 HISTORY — DX: Malignant (primary) neoplasm, unspecified: C80.1

## 2013-11-17 LAB — COMPREHENSIVE METABOLIC PANEL
ALT: 11 U/L (ref 0–35)
AST: 13 U/L (ref 0–37)
Albumin: 3.8 g/dL (ref 3.5–5.2)
CO2: 28 mEq/L (ref 19–32)
Calcium: 9.4 mg/dL (ref 8.4–10.5)
GFR calc non Af Amer: 73 mL/min — ABNORMAL LOW (ref >90)
Sodium: 140 mEq/L (ref 135–145)
Total Protein: 7.9 g/dL (ref 6.0–8.3)

## 2013-11-17 LAB — CBC WITH DIFFERENTIAL/PLATELET
Basophils Absolute: 0 10*3/uL (ref 0.0–0.1)
Eosinophils Relative: 6 % — ABNORMAL HIGH (ref 0–5)
Lymphocytes Relative: 36 % (ref 12–46)
MCV: 90.8 fL (ref 78.0–100.0)
Neutro Abs: 2.3 10*3/uL (ref 1.7–7.7)
Platelets: 267 10*3/uL (ref 150–400)
RDW: 13.9 % (ref 11.5–15.5)
WBC: 4.7 10*3/uL (ref 4.0–10.5)

## 2013-11-17 LAB — TSH: TSH: 1.261 u[IU]/mL (ref 0.350–4.500)

## 2013-11-17 NOTE — Pre-Procedure Instructions (Addendum)
Claudia Cantrell  11/17/2013   Your procedure is scheduled on:  11/24/13  Report to [facility] short stay admitting at 530 AM.  Call this number if you have problems the morning of surgery: [phone]   Remember:   Do not eat food or drink liquids after midnight.   Take these medicines the morning of surgery with A SIP OF WATER: amlodipine         STOP all herbel meds, nsaids (aleve,naproxen,advil,ibuprofen) 5 days prior to surgery including aspirin, vitamins    Do not wear jewelry, make-up or nail polish.  Do not wear lotions, powders, or perfumes. You may wear deodorant.  Do not shave 48 hours prior to surgery. Men may shave face and neck.  Do not bring valuables to the hospital.  [facility] is not responsible                  for any belongings or valuables.               Contacts, dentures or bridgework may not be worn into surgery.  Leave suitcase in the car. After surgery it may be brought to your room.  For patients admitted to the hospital, discharge time is determined by your                treatment team.               Patients discharged the day of surgery will not be allowed to drive  home.  Name and phone number of your driver:   Special Instructions: Shower using CHG 2 nights before surgery and the night before surgery.  If you shower the day of surgery use CHG.  Use special wash - you have one bottle of CHG for all showers.  You should use approximately 1/3 of the bottle for each shower.   Please read over the following fact sheets that you were given: Pain Booklet, Coughing and Deep Breathing and Surgical Site Infection Prevention

## 2013-11-22 NOTE — H&P (Signed)
Claudia Cantrell    MRN:  [id number]   Description: 67 year old female  Provider: Ernestene Mention, MD  Department: [facility]          Diagnoses      Goiter, nontoxic, multinodular    -  Primary      241.1             Reason for Visit      Follow-up      discuss goiter sx               Current Vitals - Last Recorded      BP Pulse Temp(Src) Resp Ht Wt      118/80 74 98 F (36.7 C) (Temporal) 16 5\' 5"  (1.651 m) 219 lb (99.338 kg)   BMI-  36.44 kg/m2                         History and Physical   Ernestene Mention, MD     Status: Signed            Patient ID: Claudia Cantrell, female   DOB: [birth year], 67 y.o.   MRN: [id number]    Chief Complaint   Patient presents with   .  Follow-up       discuss goiter sx             HPI Claudia Cantrell is a 67 y.o. female.  She returns for surgical planning of her thyroidectomy.   Her initial evaluation is summarized as follows:    She was referred by Dr. Lucianne Muss for evaluation of a large multinodular goiter with pressure symptoms. Dr. Trula Slade is her primary care physician.   She was diagnosed with a multinodular goiter in 2011. She has been having some dysphagia although has never been obstructed or had an impaction. Her voice is normal. She does not have any pain. She does have mild obstructive sleep apnea but doesn't have any wheezing or shortness of breath. Initial ultrasound in 2011 shows 3 cm nodule on the left. Smaller nodule on the left. Hypertrophied isthmus. Nodules were biopsied and are consistent with nonneoplastic goiter. She had a scan which showed some small hot nodule inferiorly on the right side, nonspecific.. Dr. Lucianne Muss does not think she has hyperthyroidism. CT scan at [facility] on 04/04/2011 shows marked enlargement of the left thyroid lobe and isthmus. The right lobe appeared normal. There were multiple hypodense nodular lesions throughout the thyroid. There were large coarse calcifications in the  left lower gland. There is marked rightward tracheal deviation with mild transverse narrowing. Left-sided carotid vessels were deviated laterally. There was no adenopathy. The upper mediastinum is unremarkable.   There is no history of radiation therapy to the neck. There is no family history to suggest multiple endocrine neoplasia.   Dr. Lucianne Muss has stopped her Synthroid. Her TSH is slightly low. He does not think thyroid suppression will be effective. He referred her for discussion of surgical intervention.   Comorbidities include mild obesity, hypertension, hyperlipidemia, thoracic vertebral surgery by Dr.Botero. . Total abdominal hysterectomy and BSO.    She has been evaluated by Dr. Nicki Guadalajara. He reviewed her history of myocardial infarction in 1990 and 1992 with PTCA by Dr. Daisy Floro. She has no cardiac symptoms. He cleared her for the thyroid surgery. She has also seen Dr. Shana Chute who encouraged her to go ahead with thyroid surgery.   Her symptoms are  unchanged.  . No real voice changes but she does have dysphagia. She is concerned about the deviation of the trachea to the right.         Past Medical History   Diagnosis  Date   .  Hyperlipidemia     .  Arthritis         rheumatoid         Past Surgical History   Procedure  Laterality  Date   .  Back surgery    1994   .  Abdominal hysterectomy    1988         Family History   Problem  Relation  Age of Onset   .  Heart disease  Mother     .  Diabetes  Father          Social History History   Substance Use Topics   .  Smoking status:  Former Smoker       Quit date:  12/10/1990   .  Smokeless tobacco:  Never Used   .  Alcohol Use:  Yes         Comment: very rare         Allergies   Allergen  Reactions   .  Codeine  Anxiety         Current Outpatient Prescriptions   Medication  Sig  Dispense  Refill   .  amLODipine (NORVASC) 10 MG tablet  Take 1 tablet by mouth daily.         Marland Kitchen  aspirin 81 MG tablet   Take 81 mg by mouth daily.         Marland Kitchen  BIOTIN PO  Take 1 tablet by mouth daily. 10,000 mcg         .  Multiple Vitamin (MULTIVITAMIN WITH MINERALS) TABS  Take 1 tablet by mouth daily.         .  naproxen sodium (ANAPROX) 220 MG tablet  Take 220 mg by mouth as needed.         .  rosuvastatin (CRESTOR) 20 MG tablet  Take 20 mg by mouth daily.         .  valsartan-hydrochlorothiazide (DIOVAN-HCT) 320-12.5 MG per tablet  Take 1 tablet by mouth daily.             No current facility-administered medications for this visit.        Review of Systems   Constitutional: Negative for fever, chills and unexpected weight change.  HENT: Negative for congestion, hearing loss, sore throat, trouble swallowing and voice change.   Eyes: Negative for visual disturbance.  Respiratory: Negative for cough and wheezing.   Cardiovascular: Negative for chest pain, palpitations and leg swelling.  Gastrointestinal: Negative for nausea, vomiting, abdominal pain, diarrhea, constipation, blood in stool, abdominal distention and anal bleeding.  Genitourinary: Negative for hematuria, vaginal bleeding and difficulty urinating.  Musculoskeletal: Negative for arthralgias.  Skin: Negative for rash and wound.  Neurological: Negative for seizures, syncope and headaches.  Hematological: Negative for adenopathy. Does not bruise/bleed easily.  Psychiatric/Behavioral: Negative for confusion. The patient is nervous/anxious.       Blood pressure 118/80, pulse 74, temperature 98 F (36.7 C), temperature source Temporal, resp. rate 16, height 5\' 5"  (1.651 m), weight 219 lb (99.338 kg).   Physical Exam  Constitutional: She is oriented to person, place, and time. She appears well-developed and well-nourished. No distress.  BMI 36.44  HENT:   Head: Normocephalic and atraumatic.  Nose: Nose normal.   Mouth/Throat: No oropharyngeal exudate.  Eyes: Conjunctivae and EOM are normal. Pupils are equal, round, and reactive to  light. Left eye exhibits no discharge. No scleral icterus.  Neck: Neck supple. No JVD present. Tracheal deviation present. Thyromegaly present.  Significant thyroid goiter. Most notable centrally in the isthmus also the left. Trachea deviated to right. No adenopathy. Voice is normal. No stridor. The bladder does not appear to have a substernal extension.  Cardiovascular: Normal rate, regular rhythm, normal heart sounds and intact distal pulses.    No murmur heard. Pulmonary/Chest: Effort normal and breath sounds normal. No respiratory distress. She has no wheezes. She has no rales. She exhibits no tenderness.  Abdominal: Soft. Bowel sounds are normal. She exhibits no distension and no mass. There is no tenderness. There is no rebound and no guarding.  Pfannenstiel scar.  Musculoskeletal: She exhibits no edema and no tenderness.  Vertical midline incisional midthoracic back. Well-healed.  Lymphadenopathy:    She has no cervical adenopathy.  Neurological: She is alert and oriented to person, place, and time. She exhibits normal muscle tone. Coordination normal.  Skin: Skin is warm. No rash noted. She is not diaphoretic. No erythema. No pallor.  Psychiatric: She has a normal mood and affect. Her behavior is normal. Judgment and thought content normal.      Data Reviewed My notes. [facility] notes. Ultrasound. CT scan. Cytology.   Assessment     Significant multinodular goiter with significant thyromegaly left lobe and isthmus causing tracheal deviation deviation and swallowing problems.    Mild obesity.   hypertension   Mild obstructive sleep apnea   Hyperlipidemia   History TAH and BSO       Plan    We had another long talk about her goiter, differential diagnosis, natural history. She is aware that this is most likely benign although we cannot completely exclude carcinoma without removal. She knows that she needs to go ahead with surgery and will like to have this done. She is  ready to schedule.   We will schedule for total thyroidectomy at [facility] hospital.    I discussed the indications, details, techniques, and numerous risk of the surgery with her. She is aware of the risk of temporary or permanent hoarseness from recurrent laryngeal nerve injury. She is aware of the risk of temporary or permanent hypocalcemia for parathyroid injury. Bleeding, infection and other wound problems were discussed. All of her questions are answered. She understands all these issues. She agrees with this plan.            Angelia Mould. Derrell Lolling, M.D., [facility] General and Minimally invasive Surgery Breast and Colorectal Surgery Office:   [phone] Pager:   [phone]

## 2013-11-23 MED ORDER — CEFAZOLIN SODIUM-DEXTROSE 2-3 GM-% IV SOLR
2.0000 g | INTRAVENOUS | Status: DC
  Filled 2013-11-23: qty 50

## 2013-11-24 ENCOUNTER — Encounter (HOSPITAL_COMMUNITY): Payer: Self-pay | Admitting: *Deleted

## 2013-11-24 ENCOUNTER — Encounter (HOSPITAL_COMMUNITY): Payer: PRIVATE HEALTH INSURANCE | Admitting: Anesthesiology

## 2013-11-24 ENCOUNTER — Encounter (HOSPITAL_COMMUNITY)
Admission: RE | Disposition: A | Discharge status: Home / Self Care | Payer: Self-pay | Source: Ambulatory Visit | Attending: General Surgery

## 2013-11-24 ENCOUNTER — Ambulatory Visit (HOSPITAL_COMMUNITY): Payer: PRIVATE HEALTH INSURANCE | Admitting: Anesthesiology

## 2013-11-24 ENCOUNTER — Ambulatory Visit (INDEPENDENT_AMBULATORY_CARE_PROVIDER_SITE_OTHER): Payer: Medicare Other | Admitting: General Surgery

## 2013-11-24 ENCOUNTER — Observation Stay (HOSPITAL_COMMUNITY)
Admission: RE | Admit: 2013-11-24 | Discharge: 2013-11-25 | Disposition: A | Discharge status: Home / Self Care | Payer: PRIVATE HEALTH INSURANCE | Source: Ambulatory Visit | Attending: General Surgery | Admitting: General Surgery

## 2013-11-24 DIAGNOSIS — R1310 Dysphagia, unspecified: Secondary | ICD-10-CM | POA: Insufficient documentation

## 2013-11-24 DIAGNOSIS — I10 Essential (primary) hypertension: Secondary | ICD-10-CM | POA: Insufficient documentation

## 2013-11-24 DIAGNOSIS — I209 Angina pectoris, unspecified: Secondary | ICD-10-CM | POA: Insufficient documentation

## 2013-11-24 DIAGNOSIS — E785 Hyperlipidemia, unspecified: Secondary | ICD-10-CM | POA: Insufficient documentation

## 2013-11-24 DIAGNOSIS — E669 Obesity, unspecified: Secondary | ICD-10-CM | POA: Insufficient documentation

## 2013-11-24 DIAGNOSIS — G4733 Obstructive sleep apnea (adult) (pediatric): Secondary | ICD-10-CM | POA: Insufficient documentation

## 2013-11-24 DIAGNOSIS — E042 Nontoxic multinodular goiter: Secondary | ICD-10-CM

## 2013-11-24 DIAGNOSIS — J398 Other specified diseases of upper respiratory tract: Secondary | ICD-10-CM | POA: Insufficient documentation

## 2013-11-24 DIAGNOSIS — I2510 Atherosclerotic heart disease of native coronary artery without angina pectoris: Secondary | ICD-10-CM | POA: Insufficient documentation

## 2013-11-24 DIAGNOSIS — Z23 Encounter for immunization: Secondary | ICD-10-CM | POA: Insufficient documentation

## 2013-11-24 DIAGNOSIS — I252 Old myocardial infarction: Secondary | ICD-10-CM | POA: Insufficient documentation

## 2013-11-24 DIAGNOSIS — K219 Gastro-esophageal reflux disease without esophagitis: Secondary | ICD-10-CM | POA: Insufficient documentation

## 2013-11-24 HISTORY — DX: Family history of other specified conditions: Z84.89

## 2013-11-24 HISTORY — PX: THYROIDECTOMY: SHX17

## 2013-11-24 SURGERY — THYROIDECTOMY
Anesthesia: General | Site: Neck

## 2013-11-24 MED ORDER — ASPIRIN 81 MG PO TABS: 81.0000 mg | ORAL_TABLET | Freq: Every day | ORAL | Status: DC

## 2013-11-24 MED ORDER — ARTIFICIAL TEARS OP OINT
TOPICAL_OINTMENT | OPHTHALMIC | Status: DC | PRN
  Administered 2013-11-24: 1 via OPHTHALMIC

## 2013-11-24 MED ORDER — GLYCOPYRROLATE 0.2 MG/ML IJ SOLN
INTRAMUSCULAR | Status: DC | PRN
  Administered 2013-11-24: 0.4 mg via INTRAVENOUS

## 2013-11-24 MED ORDER — INFLUENZA VAC SPLIT QUAD 0.5 ML IM SUSP
0.5000 mL | INTRAMUSCULAR | Status: AC
  Administered 2013-11-25: 0.5 mL via INTRAMUSCULAR
  Filled 2013-11-24 (×2): qty 0.5

## 2013-11-24 MED ORDER — FENTANYL CITRATE 0.05 MG/ML IJ SOLN
25.0000 ug | INTRAMUSCULAR | Status: DC | PRN
  Administered 2013-11-24 (×3): 50 ug via INTRAVENOUS

## 2013-11-24 MED ORDER — EPHEDRINE SULFATE 50 MG/ML IJ SOLN
INTRAMUSCULAR | Status: DC | PRN
  Administered 2013-11-24: 5 mg via INTRAVENOUS

## 2013-11-24 MED ORDER — ONDANSETRON HCL 4 MG PO TABS
4.0000 mg | ORAL_TABLET | Freq: Four times a day (QID) | ORAL | Status: DC | PRN
  Administered 2013-11-25: 4 mg via ORAL
  Filled 2013-11-24: qty 1

## 2013-11-24 MED ORDER — MIDAZOLAM HCL 5 MG/5ML IJ SOLN
INTRAMUSCULAR | Status: DC | PRN
  Administered 2013-11-24: 2 mg via INTRAVENOUS

## 2013-11-24 MED ORDER — ONDANSETRON HCL 4 MG/2ML IJ SOLN
4.0000 mg | Freq: Four times a day (QID) | INTRAMUSCULAR | Status: DC | PRN
  Administered 2013-11-24: 4 mg via INTRAVENOUS
  Filled 2013-11-24: qty 2

## 2013-11-24 MED ORDER — BUPIVACAINE-EPINEPHRINE (PF) 0.5% -1:200000 IJ SOLN
INTRAMUSCULAR | Status: AC
  Filled 2013-11-24: qty 10

## 2013-11-24 MED ORDER — PNEUMOCOCCAL VAC POLYVALENT 25 MCG/0.5ML IJ INJ
0.5000 mL | INJECTION | INTRAMUSCULAR | Status: AC
  Administered 2013-11-25: 0.5 mL via INTRAMUSCULAR
  Filled 2013-11-24 (×2): qty 0.5

## 2013-11-24 MED ORDER — HYDROCODONE-ACETAMINOPHEN 5-325 MG PO TABS
1.0000 | ORAL_TABLET | ORAL | Status: DC | PRN
  Administered 2013-11-24 – 2013-11-25 (×3): 2 via ORAL
  Filled 2013-11-24 (×3): qty 2

## 2013-11-24 MED ORDER — AMLODIPINE BESYLATE 10 MG PO TABS
10.0000 mg | ORAL_TABLET | Freq: Every day | ORAL | Status: DC
Start: 2013-11-24 — End: 2013-11-25
  Administered 2013-11-25: 10 mg via ORAL
  Filled 2013-11-24 (×2): qty 1

## 2013-11-24 MED ORDER — ONDANSETRON HCL 4 MG/2ML IJ SOLN
INTRAMUSCULAR | Status: DC | PRN
  Administered 2013-11-24: 4 mg via INTRAVENOUS

## 2013-11-24 MED ORDER — HYDROCHLOROTHIAZIDE 12.5 MG PO CAPS
12.5000 mg | ORAL_CAPSULE | Freq: Every day | ORAL | Status: DC
  Administered 2013-11-25: 12.5 mg via ORAL
  Filled 2013-11-24 (×2): qty 1

## 2013-11-24 MED ORDER — PROPOFOL 10 MG/ML IV BOLUS
INTRAVENOUS | Status: DC | PRN
  Administered 2013-11-24: 200 mg via INTRAVENOUS

## 2013-11-24 MED ORDER — ENOXAPARIN SODIUM 40 MG/0.4ML ~~LOC~~ SOLN
40.0000 mg | SUBCUTANEOUS | Status: DC
  Administered 2013-11-25: 40 mg via SUBCUTANEOUS
  Filled 2013-11-24 (×2): qty 0.4

## 2013-11-24 MED ORDER — VALSARTAN-HYDROCHLOROTHIAZIDE 320-12.5 MG PO TABS: 1.0000 | ORAL_TABLET | Freq: Every day | ORAL | Status: DC

## 2013-11-24 MED ORDER — CALCIUM CARBONATE 1250 (500 CA) MG PO TABS
2.0000 | ORAL_TABLET | Freq: Three times a day (TID) | ORAL | Status: DC
  Administered 2013-11-24 – 2013-11-25 (×3): 1000 mg via ORAL
  Filled 2013-11-24 (×5): qty 2

## 2013-11-24 MED ORDER — FENTANYL CITRATE 0.05 MG/ML IJ SOLN
INTRAMUSCULAR | Status: DC | PRN
  Administered 2013-11-24: 150 ug via INTRAVENOUS
  Administered 2013-11-24: 50 ug via INTRAVENOUS

## 2013-11-24 MED ORDER — BUPIVACAINE-EPINEPHRINE (PF) 0.25% -1:200000 IJ SOLN
INTRAMUSCULAR | Status: AC
  Filled 2013-11-24: qty 30

## 2013-11-24 MED ORDER — HEMOSTATIC AGENTS (NO CHARGE) OPTIME
TOPICAL | Status: DC | PRN
  Administered 2013-11-24: 1 via TOPICAL

## 2013-11-24 MED ORDER — 0.9 % SODIUM CHLORIDE (POUR BTL) OPTIME
TOPICAL | Status: DC | PRN
  Administered 2013-11-24: 1000 mL

## 2013-11-24 MED ORDER — ADULT MULTIVITAMIN W/MINERALS CH
1.0000 | ORAL_TABLET | Freq: Every day | ORAL | Status: DC
  Administered 2013-11-25: 1 via ORAL
  Filled 2013-11-24 (×2): qty 1

## 2013-11-24 MED ORDER — LIDOCAINE HCL (CARDIAC) 20 MG/ML IV SOLN
INTRAVENOUS | Status: DC | PRN
  Administered 2013-11-24: 60 mg via INTRAVENOUS
  Administered 2013-11-24: 100 mg via INTRATRACHEAL

## 2013-11-24 MED ORDER — SUCCINYLCHOLINE CHLORIDE 20 MG/ML IJ SOLN
INTRAMUSCULAR | Status: DC | PRN
  Administered 2013-11-24: 100 mg via INTRAVENOUS

## 2013-11-24 MED ORDER — POTASSIUM CHLORIDE IN NACL 20-0.9 MEQ/L-% IV SOLN
INTRAVENOUS | Status: DC
  Administered 2013-11-24: 15:00:00 via INTRAVENOUS
  Filled 2013-11-24 (×5): qty 1000

## 2013-11-24 MED ORDER — IRBESARTAN 300 MG PO TABS
300.0000 mg | ORAL_TABLET | Freq: Every day | ORAL | Status: DC
  Administered 2013-11-25: 300 mg via ORAL
  Filled 2013-11-24 (×2): qty 1

## 2013-11-24 MED ORDER — FENTANYL CITRATE 0.05 MG/ML IJ SOLN
INTRAMUSCULAR | Status: AC
  Filled 2013-11-24: qty 2

## 2013-11-24 MED ORDER — DEXAMETHASONE SODIUM PHOSPHATE 4 MG/ML IJ SOLN
INTRAMUSCULAR | Status: DC | PRN
  Administered 2013-11-24: 4 mg via INTRAVENOUS

## 2013-11-24 MED ORDER — NEOSTIGMINE METHYLSULFATE 1 MG/ML IJ SOLN
INTRAMUSCULAR | Status: DC | PRN
  Administered 2013-11-24: 3 mg via INTRAVENOUS

## 2013-11-24 MED ORDER — ROCURONIUM BROMIDE 100 MG/10ML IV SOLN
INTRAVENOUS | Status: DC | PRN
  Administered 2013-11-24: 30 mg via INTRAVENOUS

## 2013-11-24 MED ORDER — LACTATED RINGERS IV SOLN
INTRAVENOUS | Status: DC | PRN
  Administered 2013-11-24 (×2): via INTRAVENOUS

## 2013-11-24 MED ORDER — LEVOTHYROXINE SODIUM 75 MCG PO TABS
75.0000 ug | ORAL_TABLET | Freq: Every day | ORAL | Status: DC
  Administered 2013-11-25: 75 ug via ORAL
  Filled 2013-11-24 (×2): qty 1

## 2013-11-24 MED ORDER — ATORVASTATIN CALCIUM 10 MG PO TABS
10.0000 mg | ORAL_TABLET | Freq: Every day | ORAL | Status: DC
  Administered 2013-11-24: 10 mg via ORAL
  Filled 2013-11-24 (×2): qty 1

## 2013-11-24 MED ORDER — FENTANYL CITRATE 0.05 MG/ML IJ SOLN
25.0000 ug | INTRAMUSCULAR | Status: DC | PRN
  Administered 2013-11-24 (×3): 25 ug via INTRAVENOUS
  Filled 2013-11-24 (×3): qty 2

## 2013-11-24 MED ORDER — ASPIRIN 81 MG PO CHEW
81.0000 mg | CHEWABLE_TABLET | Freq: Every day | ORAL | Status: DC
  Administered 2013-11-25: 81 mg via ORAL
  Filled 2013-11-24 (×2): qty 1

## 2013-11-24 MED ORDER — BUPIVACAINE-EPINEPHRINE PF 0.5-1:200000 % IJ SOLN
INTRAMUSCULAR | Status: DC | PRN
  Administered 2013-11-24: 3.5 mL

## 2013-11-24 SURGICAL SUPPLY — 55 items
ADH SKN CLS APL DERMABOND .7 (GAUZE/BANDAGES/DRESSINGS) ×1
ATTRACTOMAT 16X20 MAGNETIC DRP (DRAPES) IMPLANT
BLADE SURG 10 STRL SS (BLADE) ×2 IMPLANT
BLADE SURG 15 STRL LF DISP TIS (BLADE) ×1 IMPLANT
BLADE SURG 15 STRL SS (BLADE) ×2
CANISTER SUCTION 2500CC (MISCELLANEOUS) ×2 IMPLANT
CHLORAPREP W/TINT 26ML (MISCELLANEOUS) ×2 IMPLANT
CLIP TI MEDIUM 24 (CLIP) ×2 IMPLANT
CLIP TI WIDE RED SMALL 24 (CLIP) ×2 IMPLANT
CONT SPEC 4OZ CLIKSEAL STRL BL (MISCELLANEOUS) IMPLANT
COVER SURGICAL LIGHT HANDLE (MISCELLANEOUS) ×2 IMPLANT
CRADLE DONUT ADULT HEAD (MISCELLANEOUS) ×2 IMPLANT
DERMABOND ADVANCED (GAUZE/BANDAGES/DRESSINGS) ×1
DERMABOND ADVANCED .7 DNX12 (GAUZE/BANDAGES/DRESSINGS) ×1 IMPLANT
DRAPE PED LAPAROTOMY (DRAPES) ×2 IMPLANT
DRAPE UTILITY 15X26 W/TAPE STR (DRAPE) ×4 IMPLANT
ELECT CAUTERY BLADE 6.4 (BLADE) ×2 IMPLANT
ELECT REM PT RETURN 9FT ADLT (ELECTROSURGICAL) ×2
ELECTRODE REM PT RTRN 9FT ADLT (ELECTROSURGICAL) ×1 IMPLANT
GAUZE SPONGE 4X4 16PLY XRAY LF (GAUZE/BANDAGES/DRESSINGS) ×3 IMPLANT
GLOVE BIO SURGEON STRL SZ7 (GLOVE) ×1 IMPLANT
GLOVE BIOGEL PI IND STRL 6.5 (GLOVE) IMPLANT
GLOVE BIOGEL PI IND STRL 7.0 (GLOVE) IMPLANT
GLOVE BIOGEL PI INDICATOR 6.5 (GLOVE) ×1
GLOVE BIOGEL PI INDICATOR 7.0 (GLOVE) ×1
GLOVE EUDERMIC 7 POWDERFREE (GLOVE) ×3 IMPLANT
GLOVE SURG SS PI 6.5 STRL IVOR (GLOVE) ×2 IMPLANT
GOWN STRL NON-REIN LRG LVL3 (GOWN DISPOSABLE) ×6 IMPLANT
GOWN STRL REIN XL XLG (GOWN DISPOSABLE) ×2 IMPLANT
HEMOSTAT SURGICEL 2X14 (HEMOSTASIS) ×1 IMPLANT
HEMOSTAT SURGICEL 2X4 FIBR (HEMOSTASIS) ×1 IMPLANT
KIT BASIN OR (CUSTOM PROCEDURE TRAY) ×2 IMPLANT
KIT ROOM TURNOVER OR (KITS) ×2 IMPLANT
NDL HYPO 25GX1X1/2 BEV (NEEDLE) IMPLANT
NEEDLE HYPO 25GX1X1/2 BEV (NEEDLE) ×2 IMPLANT
NS IRRIG 1000ML POUR BTL (IV SOLUTION) ×2 IMPLANT
PACK SURGICAL SETUP 50X90 (CUSTOM PROCEDURE TRAY) ×2 IMPLANT
PAD ARMBOARD 7.5X6 YLW CONV (MISCELLANEOUS) ×4 IMPLANT
PENCIL BUTTON HOLSTER BLD 10FT (ELECTRODE) ×2 IMPLANT
SHEARS HARMONIC 9CM CVD (BLADE) ×2 IMPLANT
SPECIMEN JAR MEDIUM (MISCELLANEOUS) ×2 IMPLANT
SPONGE INTESTINAL PEANUT (DISPOSABLE) ×3 IMPLANT
STAPLER VISISTAT 35W (STAPLE) ×1 IMPLANT
SUT MNCRL AB 4-0 PS2 18 (SUTURE) ×2 IMPLANT
SUT SILK 2 0 (SUTURE) ×2
SUT SILK 2-0 18XBRD TIE 12 (SUTURE) ×1 IMPLANT
SUT SILK 3 0 (SUTURE) ×2
SUT SILK 3-0 18XBRD TIE 12 (SUTURE) ×1 IMPLANT
SUT VIC AB 3-0 SH 18 (SUTURE) ×4 IMPLANT
SYR BULB 3OZ (MISCELLANEOUS) ×2 IMPLANT
SYR CONTROL 10ML LL (SYRINGE) ×1 IMPLANT
TOWEL OR 17X24 6PK STRL BLUE (TOWEL DISPOSABLE) ×2 IMPLANT
TOWEL OR 17X26 10 PK STRL BLUE (TOWEL DISPOSABLE) ×2 IMPLANT
TUBE CONNECTING 12X1/4 (SUCTIONS) ×2 IMPLANT
WATER STERILE IRR 1000ML POUR (IV SOLUTION) IMPLANT

## 2013-11-24 NOTE — Progress Notes (Signed)
This nurse able to remove bracelet from L wrist with lubricant but unable to remove earring from L ear due to back being bent. Victorino Dike, CRNA called for wire cutters, at bedside presently cutting earring off. Pt verbalized understanding and was okay with throwing earring in trash.

## 2013-11-24 NOTE — Anesthesia Postprocedure Evaluation (Signed)
  Anesthesia Post-op Note  Patient: Claudia Cantrell  Procedure(s) Performed: Procedure(s): TOTAL THYROIDECTOMY (N/A)  Patient Location: PACU  Anesthesia Type:General  Level of Consciousness: awake  Airway and Oxygen Therapy: Patient Spontanous Breathing  Post-op Pain: mild  Post-op Assessment: Post-op Vital signs reviewed  Post-op Vital Signs: Reviewed  Complications: No apparent anesthesia complications

## 2013-11-24 NOTE — Transfer of Care (Signed)
Immediate Anesthesia Transfer of Care Note  Patient: Claudia Cantrell  Procedure(s) Performed: Procedure(s): TOTAL THYROIDECTOMY (N/A)  Patient Location: PACU  Anesthesia Type:General  Level of Consciousness: awake, oriented and patient cooperative  Airway & Oxygen Therapy: Patient Spontanous Breathing and Patient connected to nasal cannula oxygen  Post-op Assessment: Report given to PACU RN and Post -op Vital signs reviewed and stable  Post vital signs: Reviewed  Complications: No apparent anesthesia complications

## 2013-11-24 NOTE — Anesthesia Preprocedure Evaluation (Addendum)
Anesthesia Evaluation  Patient identified by MRN, date of birth, ID band Patient awake    Reviewed: Allergy & Precautions, H&P , NPO status , Patient's Chart, lab work & pertinent test results  History of Anesthesia Complications Negative for: history of anesthetic complications  Airway Mallampati: II TM Distance: >3 FB Neck ROM: Full    Dental  (+) Teeth Intact and Dental Advisory Given   Pulmonary shortness of breath, sleep apnea and Continuous Positive Airway Pressure Ventilation , former smoker,  breath sounds clear to auscultation        Cardiovascular hypertension, Pt. on medications + angina + CAD and + Past MI Rhythm:Regular Rate:Normal     Neuro/Psych negative neurological ROS  negative psych ROS   GI/Hepatic Neg liver ROS, GERD-  Controlled,  Endo/Other  Morbid obesity  Renal/GU negative Renal ROS     Musculoskeletal negative musculoskeletal ROS (+)   Abdominal   Peds  Hematology   Anesthesia Other Findings   Reproductive/Obstetrics negative OB ROS                        Anesthesia Physical Anesthesia Plan  ASA: III  Anesthesia Plan: General   Post-op Pain Management:    Induction: Intravenous  Airway Management Planned: Oral ETT  Additional Equipment:   Intra-op Plan:   Post-operative Plan: Possible Post-op intubation/ventilation  Informed Consent:   Dental advisory given  Plan Discussed with: CRNA, Anesthesiologist and Surgeon  Anesthesia Plan Comments:        Anesthesia Quick Evaluation

## 2013-11-24 NOTE — Anesthesia Procedure Notes (Signed)
Procedure Name: Intubation Date/Time: 11/24/2013 7:37 AM Performed by: Lovie Chol Pre-anesthesia Checklist: Patient identified, Emergency Drugs available, Suction available, Patient being monitored and Timeout performed Patient Re-evaluated:Patient Re-evaluated prior to inductionOxygen Delivery Method: Circle system utilized Preoxygenation: Pre-oxygenation with 100% oxygen Intubation Type: IV induction Ventilation: Mask ventilation without difficulty and Oral airway inserted - appropriate to patient size Laryngoscope Size: Miller and 2 Grade View: Grade II Tube type: Oral Tube size: 7.0 mm Number of attempts: 1 Airway Equipment and Method: Stylet Placement Confirmation: ETT inserted through vocal cords under direct vision,  positive ETCO2,  CO2 detector and breath sounds checked- equal and bilateral Secured at: 20 cm Tube secured with: Tape Dental Injury: Teeth and Oropharynx as per pre-operative assessment  Comments: Airway deviated to right most likely due to goiter.

## 2013-11-24 NOTE — Interval H&P Note (Signed)
History and Physical Interval Note:  11/24/2013 6:55 AM  Claudia Cantrell  has presented today for surgery, with the diagnosis of multinodular goiter  The goals and the various methods of treatment have been discussed with the patient and family. After consideration of risks, benefits and other options for treatment, the patient has consented to  Procedure(s): TOTAL HYROIDECTOMY (N/A) as a surgical intervention .  The patient's history has been reviewed, patient examined today, no change in status, stable for surgery.  I have reviewed the patient's chart and labs.  Questions were answered to the patient's satisfaction.     Ernestene Mention

## 2013-11-24 NOTE — Anesthesia Postprocedure Evaluation (Deleted)
  Anesthesia Post-op Note  Patient: Claudia Cantrell  Procedure(s) Performed: Procedure(s): TOTAL THYROIDECTOMY (N/A)  Patient Location: PACU  Anesthesia Type:General  Level of Consciousness: awake  Airway and Oxygen Therapy: Patient Spontanous Breathing  Post-op Pain: mild  Post-op Assessment: Post-op Vital signs reviewed  Post-op Vital Signs: Reviewed  Complications: No apparent anesthesia complications 

## 2013-11-24 NOTE — Preoperative (Signed)
Beta Blockers   Reason not to administer Beta Blockers:Not Applicable 

## 2013-11-24 NOTE — Op Note (Signed)
Patient Name:           Claudia Cantrell   Date of Surgery:        11/24/2013  Pre op Diagnosis:      Multinodular goiter with thyromegaly resulting in tracheal deviation and dysphagia  Post op Diagnosis:    Same  Procedure:                 Total thyroidectomy  Surgeon:                     Angelia Mould. Derrell Lolling, M.D., FACS  Assistant:                      Cyndia Bent, M.D., FACS  Operative Indications:   Claudia Cantrell is a 67 y.o. Female Is brought to the operating room electively for total thyroidectomy. She was initially referred by Dr. Lucianne Muss for evaluation of a large multinodular goiter with pressure symptoms. Dr. Trula Slade is her primary care physician.  She was diagnosed with a multinodular goiter in 2011. She has been having some dysphagia although has never been obstructed or had an impaction. Her voice is normal. She does not have any pain. She does have mild obstructive sleep apnea but doesn't have any wheezing or shortness of breath. Initial ultrasound in 2011 shows 3 cm nodule on the left. Smaller nodule on the left. Hypertrophied isthmus. Nodules were biopsied and are consistent with nonneoplastic goiter. She had a scan which showed some small hot nodule inferiorly on the right side, nonspecific.. Dr. Lucianne Muss does not think she has hyperthyroidism. CT scan at [facility] on 04/04/2011 shows marked enlargement of the left thyroid lobe and isthmus. The right lobe appeared normal. There were multiple hypodense nodular lesions throughout the thyroid. There were large coarse calcifications in the left lower gland. There is marked rightward tracheal deviation with mild transverse narrowing. Left-sided carotid vessels were deviated laterally. There was no adenopathy. The upper mediastinum is unremarkable.  There is no history of radiation therapy to the neck. There is no family history to suggest multiple endocrine neoplasia.  Dr. Lucianne Muss has stopped her Synthroid. Her TSH is slightly low. He  does not think thyroid suppression will be effective. He referred her for discussion of surgical intervention.  Comorbidities include mild obesity, hypertension, hyperlipidemia, thoracic vertebral surgery by Dr.Botero. . Total abdominal hysterectomy and BSO.  She has been evaluated by Dr. Nicki Guadalajara. He reviewed her history of myocardial infarction in 1990 and 1992 with PTCA by Dr. Daisy Floro. She has no cardiac symptoms. He cleared her for the thyroid surgery. She has also seen Dr. Shana Chute who encouraged her to go ahead with thyroid surgery.     Operative Findings:       The left lobe of the thyroid and isthmus were very bulky and multilobulated. The left lobe  went far posterior and actually went down behind the suprasternal notch and the medial border of the clavicle but were mobilized up fairly easily. The right thyroid lobe was much smaller, normal in size but contained a little bit of nodularity. There was no obvious evidence of malignancy. We identified the left superior parathyroid, right superior and right inferior. I thought I may have seen the left inferior parathyroid gland but was not certain. We were able to keep the dissection in the capsule of the thyroid gland and stay well away from the recurrent laryngeal nerves.  Procedure in Detail:  Following the induction of general endotracheal anesthesia a surgical time out was performed. The patient was positioned with her arms at her sides and her neck extended and in a reverse Trendelenburg position. The patient was prepped and draped in a sterile fashion. 0.5% Marcaine with epinephrine was used as a local infiltration anesthetic. A transverse curvilinear incision was made about 2 cm above the suprasternal notch. Dissection was carried down through the subcutaneous tissue and the platysma. Skin and platysma flaps were raised superiorly and inferiorly and a self-retaining retractor was placed. The strap muscles were divided in the midline  and dissected off of the left thyroid lobe and the right thyroid lobe. I initially dissected the left thyroid lobe  Keeping the dissection in the capsule of the thyroid gland up to the superior pole. Superior pole veins and artery were isolated, secured with metal clips and divided with the harmonic scalpel. Superior parathyroid gland was seen. and preserved. The lower pole of the left lobe had to be slowly mobilized out of the superior mediastinum and posterior neck but ultimately mobilized up without much difficulty. Small venous tributaries were controlled with harmonic scalpel and the small metal clips. We then slowly carried the dissection from lateral to medial dissecting in the capsule of the thyroid gland. Middle thyroid vein and inferior thyroid artery were small, were secured with clips and divided with the Harmonic scalpel. We ultimately dissected the gland up off of the trachea anteriorly. We visualized the area where the recurrent laryngeal nerve should be and we chose not to dissect down into the fatty tissue. We were fairly comfortable that we had not come anywhere near it. I then took the dissection over to the right side and mobilized and exposed the right lobe in a similar fashion. Lower pole venous tributaries and upper pole vessels were controlled in a similar fashion with small clips and harmonic scalpel. Dissection went from lateral to medial in similar fashion again staying right in the capsule of the thyroid gland and staying well away from the recurrent laryngeal nerve. We felt that we preserved the superior and inferior parathyroid glands on the right. The upper pole of the right lobe and the upper pole of the left lobe were marked with sutures to orient the pathologist and the specimen was sent to pathology. The wound was irrigated with saline. Hemostasis was very good. Right and left thyroid beds were packed with Fibrillar hemostatic sponge. We observed for several minutes and  there was  no bleeding. Strap muscles were closed in the midline with interrupted sutures of 3-0 Vicryl. Platysma muscle was closed with interrupted sutures of 3-0 Vicryl and the skin closed with a running subcuticular suture of 4-0 Monocryl and Dermabond. The patient tolerated the procedure well and was taken to recovery in stable condition. EBL less than 20 cc. Counts correct. Complications none.     Angelia Mould. Derrell Lolling, M.D., FACS General and Minimally Invasive Surgery Breast and Colorectal Surgery  11/24/2013 9:17 AM

## 2013-11-25 MED ORDER — HYDROCODONE-ACETAMINOPHEN 5-325 MG PO TABS: 1.0000 | ORAL_TABLET | ORAL | Status: DC | PRN

## 2013-11-25 MED ORDER — LEVOTHYROXINE SODIUM 75 MCG PO TABS: 75.0000 ug | ORAL_TABLET | Freq: Every day | ORAL | Status: DC

## 2013-11-25 NOTE — Discharge Summary (Signed)
Patient ID: Claudia Cantrell [id number] 67 y.o. 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  hypertension  Mild obstructive sleep apnea  Hyperlipidemia  History TAH and BSO   Operations: Procedure(s): TOTAL THYROIDECTOMY  Admission Condition: good  Discharged Condition: good  Indication for Admission: Claudia Cantrell is a 67 y.o. Female Is brought to the operating room electively for total thyroidectomy.  She was initially referred by Dr. Lucianne Muss for evaluation of a large multinodular goiter with pressure symptoms. Dr. Trula Slade is her primary care physician.  She was diagnosed with a multinodular goiter in 2011. She has been having some dysphagia although has never been obstructed or had an impaction. Her voice is normal. She does not have any pain. She does have mild obstructive sleep apnea but doesn't have any wheezing or shortness of breath. Initial ultrasound in 2011 shows 3 cm nodule on the left. Smaller nodule on the left. Hypertrophied isthmus. Nodules were biopsied and are consistent with nonneoplastic goiter. She had a scan which showed some small hot nodule inferiorly on the right side, nonspecific.. Dr. Lucianne Muss does not think she has hyperthyroidism. CT scan at [facility] on 04/04/2011 shows marked enlargement of the left thyroid lobe and isthmus. The right lobe appeared normal. There were multiple hypodense nodular lesions throughout the thyroid. There were large coarse calcifications in the left lower gland. There is marked rightward tracheal deviation with mild transverse narrowing. Left-sided carotid vessels were deviated laterally. There was no adenopathy. The upper mediastinum is unremarkable.  There is no  history of radiation therapy to the neck. There is no family history to suggest multiple endocrine neoplasia.  Dr. Lucianne Muss has stopped her Synthroid. Her TSH is slightly low. He does not think thyroid suppression will be effective. He referred her for discussion of surgical intervention.  Comorbidities include mild obesity, hypertension, hyperlipidemia, thoracic vertebral surgery by Dr.Botero. . Total abdominal hysterectomy and BSO   Hospital Course: On the day of admission the patient was taken to the operating room and underwent a total thyroidectomy. The procedure was uneventful. The patient was observed in the hospital overnight. She had no problems with bleeding or wound. Her voice remained normal and strong. Calcium level at 5 PM was 9.2. Morning calcium level is pending. On the morning of discharge the patient was awake and alert and comfortable and felt ready to go home. She had no paresthesias or  muscle cramps.. Exam revealed a soft neck wound without any hematoma. Chvostek's sign was negative bilaterally. She was instructed in diet and activities and medications. She will be discharged on synthroid 75 mcg per day in the morning before breakfast, 2 Tums tablets 3 times a day to begin at 10 AM and she given a prescription for hydrocodone. We will check her calcium level in one week as an outpatient. She will return to see me in the office in 2 weeks and we'll check another calcium level at that time.  Consults: None  Significant Diagnostic Studies: labs: Postop calcium levels  Treatments: surgery: Total thyroidectomy  Disposition: Home  Patient Instructions:    Medication List         amLODipine 10 MG tablet  Commonly known as:  NORVASC  Take 10 mg by mouth daily.     aspirin 81 MG tablet  Take 81 mg by mouth daily.     HYDROcodone-acetaminophen 5-325 MG per tablet  Commonly known as:  NORCO/VICODIN  Take 1-2 tablets by mouth every 4 (four) hours as needed.     levothyroxine 75  MCG tablet  Commonly known as:  SYNTHROID, LEVOTHROID  Take 1 tablet (75 mcg total) by mouth daily before breakfast.     multivitamin with minerals Tabs tablet  Take 1 tablet by mouth daily.     rosuvastatin 20 MG tablet  Commonly known as:  CRESTOR  Take 20 mg by mouth daily.     valsartan-hydrochlorothiazide 320-12.5 MG per tablet  Commonly known as:  DIOVAN-HCT  Take 1 tablet by mouth daily.        Activity: activity as tolerated Diet: low fat, low cholesterol diet Wound Care: none needed  Follow-up:  With Dr. Derrell Lolling in 2 weeks.  Signed: Angelia Mould. Derrell Lolling, M.D., FACS General and minimally invasive surgery Breast and Colorectal Surgery  11/25/2013, 5:57 AM

## 2013-11-25 NOTE — Discharge Instructions (Signed)
(  see above---read instructions carefully)

## 2013-11-26 ENCOUNTER — Telehealth (INDEPENDENT_AMBULATORY_CARE_PROVIDER_SITE_OTHER): Payer: Self-pay

## 2013-11-26 ENCOUNTER — Other Ambulatory Visit (INDEPENDENT_AMBULATORY_CARE_PROVIDER_SITE_OTHER): Payer: Self-pay

## 2013-11-26 ENCOUNTER — Encounter (HOSPITAL_COMMUNITY): Payer: Self-pay | Admitting: General Surgery

## 2013-11-26 DIAGNOSIS — E049 Nontoxic goiter, unspecified: Secondary | ICD-10-CM

## 2013-11-26 NOTE — Telephone Encounter (Signed)
Message copied by Ivory Broad on Thu Nov 26, 2013  3:43 PM ------      Message from: Ernestene Mention      Created: Thu Nov 26, 2013  1:13 PM       Inform patient of Pathology report,.Benign goiter. No malignancy. Good news.            hmi ------

## 2013-11-26 NOTE — Telephone Encounter (Signed)
I called the pt and let her know her benign results.  I gave her the po appointment for 12/17/2013.  I asked her to get her Calcium checked 12/02/2013 and another one right before her follow up visit at [facility].  We will call with the results on her 1st calcium level.

## 2013-11-26 NOTE — Progress Notes (Signed)
Quick Note:  Inform patient of Pathology report,.Benign goiter. No malignancy. Good news.  hmi ______

## 2013-12-02 LAB — CALCIUM: Calcium: 9.7 mg/dL (ref 8.4–10.5)

## 2013-12-04 ENCOUNTER — Telehealth (INDEPENDENT_AMBULATORY_CARE_PROVIDER_SITE_OTHER): Payer: Self-pay | Admitting: General Surgery

## 2013-12-04 NOTE — Telephone Encounter (Signed)
Called patient this morning to let her know that her calcium level was normal and per Dr Derrell Lolling that she will need to cut back calcium tabs to one BID ( lunch and supper ). Continue to take synthroid in am before breakfast. Patient understood the new orders from Dr Derrell Lolling

## 2013-12-15 ENCOUNTER — Other Ambulatory Visit (INDEPENDENT_AMBULATORY_CARE_PROVIDER_SITE_OTHER): Payer: Self-pay | Admitting: General Surgery

## 2013-12-15 DIAGNOSIS — E049 Nontoxic goiter, unspecified: Secondary | ICD-10-CM

## 2013-12-16 LAB — CALCIUM: Calcium: 9.9 mg/dL (ref 8.4–10.5)

## 2013-12-17 ENCOUNTER — Encounter (INDEPENDENT_AMBULATORY_CARE_PROVIDER_SITE_OTHER): Payer: Self-pay | Admitting: General Surgery

## 2013-12-17 ENCOUNTER — Ambulatory Visit (INDEPENDENT_AMBULATORY_CARE_PROVIDER_SITE_OTHER): Payer: Medicare Other | Admitting: General Surgery

## 2013-12-17 VITALS — BP 121/86 | HR 78 | Temp 98.6°F | Resp 16 | Ht 65.0 in | Wt 223.2 lb

## 2013-12-17 DIAGNOSIS — E042 Nontoxic multinodular goiter: Secondary | ICD-10-CM

## 2013-12-17 NOTE — Patient Instructions (Signed)
You are recovering from your total thyroidectomy without any significant complication.  Your  calcium level is 9.9, which is normal. Decrease the calcium tablets to 2 tablets a day for 3 days, and then if there is no problem, reduce the calcium tablets to one a day thereafter.  Continue Synthroid 75 mcg per day  You will be scheduled for blood work in approximately one week.  Make an appointment to see Dr. Dwyane Dee within the next 3 or 4 weeks.  Return to see Dr. Dalbert Batman in 6 weeks.  Follow a low-fat, low sugar diet, and exercise more.

## 2013-12-17 NOTE — Progress Notes (Signed)
Patient ID: Claudia Cantrell, female   DOB: [birth year], 68 y.o.   MRN: [id number] History: This patient underwent total thyroidectomy on 11/24/2013. This was for benign multinodular goiter with tracheal deviation and dysphagia. She feels much better. She has noted no breathing  problems and no swallowing problems. She is very pleased. She is taking Synthroid 75 mcg per day. She is still taking four calcium tablets a day in the afternoon. Calcium level is 9.9. Final pathology report shows benign multinodular goiter  Exam: Patient looks well. Voice is normal and strong. Chvostek'sbsign negative bilaterally. Thyroidectomy incision is healing uneventfully.  Assessment: Benign multinodular goiter with pressure symptoms. Doing very well in the postop period. Suspect normal parathyroid function  Plan: Decreased calcium tablets to one tablet per day Continue Synthroid 75 mcg per day Repeat calcium and TSH level in 7-8 days  I told her to go ahead and make an appointment with Dr. Dwyane Dee sometime in the next few weeks Return to see me in 6 weeks.   Edsel Petrin. Dalbert Batman, M.D., [facility] General and Minimally invasive Surgery Breast and Colorectal Surgery Office:   [phone] Pager:   [phone]

## 2013-12-30 LAB — CALCIUM: Calcium: 10.2 mg/dL (ref 8.4–10.5)

## 2013-12-30 LAB — TSH: TSH: 9.603 u[IU]/mL — ABNORMAL HIGH (ref 0.350–4.500)

## 2013-12-31 ENCOUNTER — Telehealth (INDEPENDENT_AMBULATORY_CARE_PROVIDER_SITE_OTHER): Payer: Self-pay | Admitting: General Surgery

## 2013-12-31 NOTE — Telephone Encounter (Signed)
LMOM to let the patient know that her calcium level is normal, and she may stop taking her calcium tablets altogether. And I told her TSH is only slightly high. She may need minor adjustments in her Synthroid but I would not change anything right. Be sure that she has an appointment to see. Dr Dwyane Dee for management

## 2014-01-21 ENCOUNTER — Other Ambulatory Visit (INDEPENDENT_AMBULATORY_CARE_PROVIDER_SITE_OTHER): Payer: Self-pay

## 2014-01-21 ENCOUNTER — Telehealth (INDEPENDENT_AMBULATORY_CARE_PROVIDER_SITE_OTHER): Payer: Self-pay

## 2014-01-21 ENCOUNTER — Encounter (INDEPENDENT_AMBULATORY_CARE_PROVIDER_SITE_OTHER): Payer: Medicare Other | Admitting: General Surgery

## 2014-01-21 MED ORDER — LEVOTHYROXINE SODIUM 75 MCG PO TABS: 75.0000 ug | ORAL_TABLET | Freq: Every day | ORAL | Status: DC

## 2014-01-21 NOTE — Telephone Encounter (Signed)
Per Dr Darrel Hoover request RX for synthroid 75 mcg refill called to [facility] on [address]. Pt aware.

## 2014-01-21 NOTE — Telephone Encounter (Signed)
Pt has appt to see Dr Dalbert Batman on 01-29-14 for 6 wk po thyroid reck. Pt will run out of synthroid this week. Pt aware she will be following with Dr Dwyane Dee for her synthroid in the future. Dr Dwyane Dee has moved to [facility] but pts records have not yet arrived to Dr Dwyane Dee from [place]. Pt will not be able to set up appt until Dr Dwyane Dee has received the pts records from [place]. The records have been requested. Pt would like refill by Dr Dalbert Batman for another 30 day supply in hopes that her appt will be made with Dr Dwyane Dee before she runs out of med again. Pt advised request will be reviewed with Dr Dalbert Batman today and RX should be at her pharmacy by tomorrow.

## 2014-01-28 ENCOUNTER — Encounter (INDEPENDENT_AMBULATORY_CARE_PROVIDER_SITE_OTHER): Payer: Medicare Other | Admitting: General Surgery

## 2014-01-29 ENCOUNTER — Ambulatory Visit (INDEPENDENT_AMBULATORY_CARE_PROVIDER_SITE_OTHER): Payer: Medicare Other | Admitting: General Surgery

## 2014-01-29 ENCOUNTER — Encounter (INDEPENDENT_AMBULATORY_CARE_PROVIDER_SITE_OTHER): Payer: Self-pay | Admitting: General Surgery

## 2014-01-29 VITALS — BP 140/90 | HR 72 | Temp 98.5°F | Resp 14 | Ht 65.0 in | Wt 231.0 lb

## 2014-01-29 DIAGNOSIS — E042 Nontoxic multinodular goiter: Secondary | ICD-10-CM

## 2014-01-29 NOTE — Progress Notes (Signed)
Patient ID: Claudia Cantrell, female   DOB: 11-Feb-1946, 68 y.o.   MRN: [id number] History:  This patient underwent total thyroidectomy on 11/24/2013. This was for benign multinodular goiter with tracheal deviation and dysphagia. She feels much better. She has noted no breathing problems and no swallowing problems. She is very pleased.  She is taking Synthroid 75 mcg per day.She has stopped taking calcium tablets. She has no paresthesias or muscle cramps. Last calcium level was 10.2 on January 20. Last TSH level was 9.603 on January 20. She is still taking Synthroid 75 mcg per day. I told her she might be a little bit more. She is scheduled to see Dr. Dwyane Dee next week. Final pathology report shows benign multinodular goiter   Exam:  Patient looks well. Voice is normal and strong. Chvostek's sign negative bilaterally. Thyroidectomy scar looks very good. Tissues are soft. Excellent range of motion of her neck.   Assessment:  Benign multinodular goiter with pressure symptoms. Doing very well in the postop period.  Suspect normal parathyroid function  Slightly hyperthyroid on 75 mcg Synthroid daily  Plan:  Continue Synthroid 75 mcg per day   She will see Dr. Dwyane Dee next week. I told her to anticipate that he may adjust her Synthroid days  She  requested that she also follow up with me, and so I'll see her in 6 months, for reassurance if no other reason.    Edsel Petrin. Dalbert Batman, M.D., [facility] General and Minimally invasive Surgery Breast and Colorectal Surgery Office:   [phone] Pager:   [phone]

## 2014-01-29 NOTE — Patient Instructions (Signed)
You have recovered from your thyroid surgery without any obvious complications.  You do not necessarily need calcium supplementation, but you might want to take one pill a day just for bone health and to prevent osteoporosis. You can discuss this with Dr. Dwyane Dee.  Your dose of Synthroid is probably a little bit low, because your TSH level is slightly high. Since you are seeing Dr. Dwyane Dee next week, I would advise you to continue on the same dose and then let him decide what the new dose  should be  At your request, return to see Dr. Dalbert Batman in 6 months.

## 2014-02-01 ENCOUNTER — Ambulatory Visit (INDEPENDENT_AMBULATORY_CARE_PROVIDER_SITE_OTHER): Payer: No Typology Code available for payment source | Admitting: Endocrinology

## 2014-02-01 ENCOUNTER — Encounter: Payer: Self-pay | Admitting: Endocrinology

## 2014-02-01 VITALS — BP 128/80 | HR 79 | Temp 98.5°F | Resp 16 | Ht 65.0 in | Wt 231.2 lb

## 2014-02-01 DIAGNOSIS — E890 Postprocedural hypothyroidism: Secondary | ICD-10-CM | POA: Insufficient documentation

## 2014-02-01 MED ORDER — LEVOTHYROXINE SODIUM 125 MCG PO TABS: 125.0000 ug | ORAL_TABLET | Freq: Every day | ORAL | Status: DC

## 2014-02-01 NOTE — Progress Notes (Signed)
Claudia Cantrell  Reason for Appointment:  Hypothyroidism, postsurgical    History of Present Illness:   She had a large multinodular goiter causing local pressure symptoms and was referred to the surgeon last year She finally had her thyroidectomy in 11/2013. Pathology revealed that her thyroid size was 108 gm She did have transient hypocalcemia and was given calcium supplements by her surgeon. Does not have any symptoms now. Also has had some hoarseness but this is improving She does have significant improvement in her symptoms of choking and difficulty breathing even with exertion  Hypothyroidism:The patient has been treated with 75 mcg of levothyroxine since her surgery  However for the last 3 weeks or so she has had increasing fatigue, cold sensitivity and significant weight gain             The patient is taking the thyroid supplement very regularly in the morning before breakfast. Not taking any calcium or iron supplements with the thyroid supplement.    Wt Readings from Last 3 Encounters:  02/01/14 231 lb 3.2 oz (104.872 kg)  01/29/14 231 lb (104.781 kg)  12/17/13 223 lb 3.2 oz (101.243 kg)    No visits with results within 1 Week(s) from this visit. Latest known visit with results is:  Office Visit on 12/17/2013  Component Date Value Ref Range Status  . TSH 12/29/2013 9.603* 0.350 - 4.500 uIU/mL Final  . Calcium 12/29/2013 10.2  8.4 - 10.5 mg/dL Final      Medication List       This list is accurate as of: 02/01/14  1:57 PM.  Always use your most recent med list.               amLODipine 10 MG tablet  Commonly known as:  NORVASC  Take 10 mg by mouth daily.     aspirin 81 MG tablet  Take 81 mg by mouth daily.     levothyroxine 75 MCG tablet  Commonly known as:  SYNTHROID, LEVOTHROID  Take 1 tablet (75 mcg total) by mouth daily before breakfast.     multivitamin with minerals Tabs tablet  Take 1 tablet by mouth daily.     rosuvastatin 20 MG tablet  Commonly  known as:  CRESTOR  Take 20 mg by mouth daily.     valsartan-hydrochlorothiazide 320-12.5 MG per tablet  Commonly known as:  DIOVAN-HCT  Take 1 tablet by mouth daily.        Allergies:  Allergies  Allergen Reactions  . Codeine Anxiety    Past Medical History  Diagnosis Date  . Hyperlipidemia   . Arthritis     rheumatoid  . Myocardial infarction     92  . Anginal pain     occ  . Hypertension   . Shortness of breath     exersional  . Sleep apnea     cpap used 2 yrs  . GERD (gastroesophageal reflux disease)     occ  . Cancer   . Family history of anesthesia complication     daughter has difficulty waking     Past Surgical History  Procedure Laterality Date  . Abdominal hysterectomy  1988  . Back surgery  1994  . Thyroidectomy  11/24/2013    DR Claudia Cantrell  . Thyroidectomy N/A 11/24/2013    Procedure: TOTAL THYROIDECTOMY;  Surgeon: Claudia Hector, MD;  Location: [place];  Service: General;  Laterality: N/A;    Family History  Problem Relation Age of Onset  .  Heart disease Mother   . Diabetes Father     Social History:  reports that she quit smoking about 23 years ago. Her smoking use included Cigarettes. She has a 15 pack-year smoking history. She has never used smokeless tobacco. She reports that she drinks alcohol. She reports that she does not use illicit drugs.  REVIEW Of SYSTEMS:    Examination:   BP 128/80  Pulse 79  Temp(Src) 98.5 F (36.9 C)  Resp 16  Ht 5\' 5"  (1.651 m)  Wt 231 lb 3.2 oz (104.872 kg)  BMI 38.47 kg/m2  SpO2 95%  GENERAL APPEARANCE: No puffiness of face or hands, looks well  NECK:  scar of thyroidectomy present, no local swelling or mass. No lymphadenopathy          NEUROLOGIC EXAM:  biceps reflexes show  slightly slow relaxation Skin: Not unusual dry     Assessment   Hypothyroidism, postsurgical with significant hypothyroid symptoms now Her TSH about a month after surgery was increased to 9.6 with taking 75 mcg  supplement Since her weight is about 230 pounds she will need a significantly higher dose for supplementation  Discussed postoperative hypothyroidism and need for periodic adjustment of dosage   Treatment:  She will increase the dose to 125 mcg and have thyroid levels repeated in 6 weeks Will recheck calcium also on her next visit  Claudia Cantrell 02/01/2014, 1:57 PM

## 2014-02-01 NOTE — Patient Instructions (Addendum)
Synthroid 125ug daily instead of 75   Avoid taking any calcium or iron supplements with the thyroid supplement.

## 2014-03-15 ENCOUNTER — Other Ambulatory Visit (INDEPENDENT_AMBULATORY_CARE_PROVIDER_SITE_OTHER): Payer: No Typology Code available for payment source

## 2014-03-15 DIAGNOSIS — E890 Postprocedural hypothyroidism: Secondary | ICD-10-CM

## 2014-03-15 LAB — TSH: TSH: 0.48 u[IU]/mL (ref 0.35–5.50)

## 2014-03-15 LAB — BASIC METABOLIC PANEL
BUN: 14 mg/dL (ref 6–23)
CHLORIDE: 102 meq/L (ref 96–112)
CO2: 28 mEq/L (ref 19–32)
Calcium: 9.4 mg/dL (ref 8.4–10.5)
Creatinine, Ser: 1 mg/dL (ref 0.4–1.2)
GFR: 73.96 mL/min (ref >60.00)
Glucose, Bld: 98 mg/dL (ref 70–99)
POTASSIUM: 3.8 meq/L (ref 3.5–5.1)
Sodium: 141 mEq/L (ref 135–145)

## 2014-03-15 LAB — T4, FREE: FREE T4: 1.05 ng/dL (ref 0.60–1.60)

## 2014-03-18 ENCOUNTER — Encounter: Payer: Self-pay | Admitting: Endocrinology

## 2014-03-18 ENCOUNTER — Ambulatory Visit (INDEPENDENT_AMBULATORY_CARE_PROVIDER_SITE_OTHER): Payer: No Typology Code available for payment source | Admitting: Endocrinology

## 2014-03-18 VITALS — BP 122/70 | HR 84 | Temp 98.1°F | Resp 14 | Ht 65.0 in | Wt 230.4 lb

## 2014-03-18 DIAGNOSIS — E890 Postprocedural hypothyroidism: Secondary | ICD-10-CM

## 2014-03-18 DIAGNOSIS — I10 Essential (primary) hypertension: Secondary | ICD-10-CM

## 2014-03-18 MED ORDER — PHENTERMINE-TOPIRAMATE ER 3.75-23 MG PO CP24: 3.7500 mg | ORAL_CAPSULE | Freq: Every day | ORAL | Status: DC

## 2014-03-18 MED ORDER — LEVOTHYROXINE SODIUM 112 MCG PO TABS: 112.0000 ug | ORAL_TABLET | Freq: Every day | ORAL | Status: DC

## 2014-03-18 NOTE — Progress Notes (Signed)
Claudia Cantrell 68 y.o.   Reason for Appointment:  Hypothyroidism, postsurgical    History of Present Illness:   Background information: She had a large multinodular goiter causing local pressure symptoms and was referred to the surgeon last year She finally had her thyroidectomy in 11/2013. Pathology revealed that her thyroid size was 108 gm She did have transient hypocalcemia and was given calcium supplements by her surgeon. Does not have any symptoms now. Also has had some hoarseness but this is improving She does have significant improvement in her symptoms of choking and difficulty breathing even with exertion  Hypothyroidism:The patient has been treated with 125 mcg of levothyroxine since 2/15 Previously on 75 mcg she was having significant fatigue, weight gain and cold intolerance Her energy level is improving with increasing the dose but she is still frustrated with difficulty losing weight She thinks she is tending to have more cravings for sweets and snacks Also may be a little more anxious             The patient is taking the thyroid supplement very regularly in the morning before breakfast.  Not taking any calcium or iron supplements with the thyroid supplement.   Hypocalcemia: This has resolved      Appointment on 03/15/2014  Component Date Value Ref Range Status  . TSH 03/15/2014 0.48  0.35 - 5.50 uIU/mL Final  . Free T4 03/15/2014 1.05  0.60 - 1.60 ng/dL Final  . Sodium 03/15/2014 141  135 - 145 mEq/L Final  . Potassium 03/15/2014 3.8  3.5 - 5.1 mEq/L Final  . Chloride 03/15/2014 102  96 - 112 mEq/L Final  . CO2 03/15/2014 28  19 - 32 mEq/L Final  . Glucose, Bld 03/15/2014 98  70 - 99 mg/dL Final  . BUN 03/15/2014 14  6 - 23 mg/dL Final  . Creatinine, Ser 03/15/2014 1.0  0.4 - 1.2 mg/dL Final  . Calcium 03/15/2014 9.4  8.4 - 10.5 mg/dL Final  . GFR 03/15/2014 73.96  >60.00 mL/min Final      Medication List       This list is accurate as of: 03/18/14  1:36 PM.   Always use your most recent med list.               amLODipine 10 MG tablet  Commonly known as:  NORVASC  Take 10 mg by mouth daily.     aspirin 81 MG tablet  Take 81 mg by mouth daily.     levothyroxine 125 MCG tablet  Commonly known as:  SYNTHROID, LEVOTHROID  Take 1 tablet (125 mcg total) by mouth daily.     multivitamin with minerals Tabs tablet  Take 1 tablet by mouth daily.     rosuvastatin 20 MG tablet  Commonly known as:  CRESTOR  Take 20 mg by mouth daily.     valsartan-hydrochlorothiazide 320-12.5 MG per tablet  Commonly known as:  DIOVAN-HCT  Take 1 tablet by mouth daily.        Allergies:  Allergies  Allergen Reactions  . Codeine Anxiety    Past Medical History  Diagnosis Date  . Hyperlipidemia   . Arthritis     rheumatoid  . Myocardial infarction     89  . Anginal pain     occ  . Hypertension   . Shortness of breath     exersional  . Sleep apnea     cpap used 2 yrs  . GERD (gastroesophageal reflux disease)  occ  . Cancer   . Family history of anesthesia complication     daughter has difficulty waking     Past Surgical History  Procedure Laterality Date  . Abdominal hysterectomy  1988  . Back surgery  1994  . Thyroidectomy  11/24/2013    DR Dalbert Batman  . Thyroidectomy N/A 11/24/2013    Procedure: TOTAL THYROIDECTOMY;  Surgeon: Adin Hector, MD;  Location: [place];  Service: General;  Laterality: N/A;    Family History  Problem Relation Age of Onset  . Heart disease Mother   . Diabetes Father     Social History:  reports that she quit smoking about 23 years ago. Her smoking use included Cigarettes. She has a 15 pack-year smoking history. She has never used smokeless tobacco. She reports that she drinks alcohol. She reports that she does not use illicit drugs.  REVIEW Of SYSTEMS:  She is trying to lose weight with the help of starting to go to the [facility]. However recently had a sprain in her foot   Examination:   BP 122/70   Pulse 84  Temp(Src) 98.1 F (36.7 C)  Resp 14  Ht 5\' 5"  (1.651 m)  Wt 230 lb 6.4 oz (104.509 kg)  BMI 38.34 kg/m2  SpO2 97%  General: She is a little anxious  NECK:  scar of thyroidectomy present, no local swelling or mass.    NEUROLOGIC EXAM:  biceps reflexes show brisk relaxation     Assessment   Hypothyroidism, postsurgical with normalization of TSH on 125 mcg However she appears to be having some symptoms of anxiety, possibly from getting her TSH low normal at 0.48 Her thyroidectomy scar is well healed although she is still having some sensitivity locally  Weight gain: She is complaining about this today and appears not to be able to lose any weight despite starting some exercise. She is interested in a weight loss medication   Treatment:  She will reduce the dose of Synthroid to 112 and have her followup in 3 months Have given her a prescription for Qsymia for weight loss and she will check with her insurance to see if it is covered Discussed that this should be only a short-term prescription  Claudia Cantrell 03/18/2014, 1:36 PM

## 2014-06-14 ENCOUNTER — Other Ambulatory Visit: Payer: No Typology Code available for payment source

## 2014-06-17 ENCOUNTER — Ambulatory Visit: Payer: No Typology Code available for payment source | Admitting: Endocrinology

## 2014-06-17 ENCOUNTER — Telehealth: Payer: Self-pay | Admitting: *Deleted

## 2014-06-17 NOTE — Telephone Encounter (Signed)
Returned CPAP supply order to [facility]. 

## 2014-06-24 ENCOUNTER — Other Ambulatory Visit (INDEPENDENT_AMBULATORY_CARE_PROVIDER_SITE_OTHER): Payer: No Typology Code available for payment source

## 2014-06-24 DIAGNOSIS — E890 Postprocedural hypothyroidism: Secondary | ICD-10-CM

## 2014-06-24 LAB — T4, FREE: FREE T4: 0.96 ng/dL (ref 0.60–1.60)

## 2014-06-24 LAB — TSH: TSH: 0.85 u[IU]/mL (ref 0.35–4.50)

## 2014-06-29 ENCOUNTER — Ambulatory Visit (INDEPENDENT_AMBULATORY_CARE_PROVIDER_SITE_OTHER): Payer: No Typology Code available for payment source | Admitting: Endocrinology

## 2014-06-29 ENCOUNTER — Encounter: Payer: Self-pay | Admitting: Endocrinology

## 2014-06-29 VITALS — BP 122/76 | HR 64 | Temp 98.9°F | Ht 65.0 in | Wt 222.0 lb

## 2014-06-29 DIAGNOSIS — E890 Postprocedural hypothyroidism: Secondary | ICD-10-CM

## 2014-06-29 MED ORDER — LEVOTHYROXINE SODIUM 112 MCG PO TABS: 112.0000 ug | ORAL_TABLET | Freq: Every day | ORAL | Status: DC

## 2014-06-29 NOTE — Progress Notes (Signed)
Claudia Cantrell 68 y.o.   Reason for Appointment:  Hypothyroidism, postsurgical    History of Present Illness:   Background information: She had a large multinodular goiter causing local pressure symptoms and was referred to the surgeon last year She finally had her thyroidectomy in 11/2013. Pathology revealed that her thyroid size was 108 gm She did have transient hypocalcemia and was given calcium supplements by her surgeon. Does not have any symptoms now. Also has had some hoarseness but this is improving She does have significant improvement in her symptoms of choking and difficulty breathing even with exertion  Hypothyroidism:The patient has been treated with 125 mcg of levothyroxine since 2/15 and this was reduced to 112 in 4/15 On our last visit because of low normal TSH and symptoms of anxiety her dose was reduced slightly She usually feels fairly good with normal energy level although this is inconsistent No anxiety at this time and no unusual heat or cold intolerance             The patient is taking the thyroid supplement  regularly in the morning before breakfast.  Not taking any calcium or iron supplements with the thyroid supplement.   Hypocalcemia: No symptoms of paresthesias in her face or muscle cramps   Appointment on 06/24/2014  Component Date Value Ref Range Status  . TSH 06/24/2014 0.85  0.35 - 4.50 uIU/mL Final  . Free T4 06/24/2014 0.96  0.60 - 1.60 ng/dL Final      Medication List       This list is accurate as of: 06/29/14 10:30 AM.  Always use your most recent med list.               amLODipine 10 MG tablet  Commonly known as:  NORVASC  Take 10 mg by mouth daily.     aspirin 81 MG tablet  Take 81 mg by mouth daily.     levothyroxine 112 MCG tablet  Commonly known as:  SYNTHROID, LEVOTHROID  Take 1 tablet (112 mcg total) by mouth daily.     multivitamin with minerals Tabs tablet  Take 1 tablet by mouth daily.     Phentermine-Topiramate  3.75-23 MG Cp24  Take 3.75 mg by mouth daily.     rosuvastatin 20 MG tablet  Commonly known as:  CRESTOR  Take 20 mg by mouth daily.     valsartan-hydrochlorothiazide 320-12.5 MG per tablet  Commonly known as:  DIOVAN-HCT  Take 1 tablet by mouth daily.        Allergies:  Allergies  Allergen Reactions  . Codeine Anxiety    Past Medical History  Diagnosis Date  . Hyperlipidemia   . Arthritis     rheumatoid  . Myocardial infarction     92  . Anginal pain     occ  . Hypertension   . Shortness of breath     exersional  . Sleep apnea     cpap used 2 yrs  . GERD (gastroesophageal reflux disease)     occ  . Cancer   . Family history of anesthesia complication     daughter has difficulty waking     Past Surgical History  Procedure Laterality Date  . Abdominal hysterectomy  1988  . Back surgery  1994  . Thyroidectomy  11/24/2013    DR Dalbert Batman  . Thyroidectomy N/A 11/24/2013    Procedure: TOTAL THYROIDECTOMY;  Surgeon: Adin Hector, MD;  Location: [facility];  Service: General;  Laterality: N/A;  Family History  Problem Relation Age of Onset  . Heart disease Mother   . Diabetes Father     Social History:  reports that she quit smoking about 23 years ago. Her smoking use included Cigarettes. She has a 15 pack-year smoking history. She has never used smokeless tobacco. She reports that she drinks alcohol. She reports that she does not use illicit drugs.  REVIEW Of SYSTEMS:  She is trying to lose weight with the help of starting walking She was given Qsymia but she only took a couple of tablets and did not want to continue  Wt Readings from Last 3 Encounters:  06/29/14 222 lb (100.699 kg)  03/18/14 230 lb 6.4 oz (104.509 kg)  02/01/14 231 lb 3.2 oz (104.872 kg)   She is asking about a refill on her cholesterol medicine, advised her to followup with PCP who manages this   Examination:   BP 122/76  Pulse 64  Temp(Src) 98.9 F (37.2 C) (Oral)  Ht 5\' 5"   (1.651 m)  Wt 222 lb (100.699 kg)  BMI 36.94 kg/m2  SpO2 96%  General: Well-built and nourished, pleasant  NECK:  scar of thyroidectomy present, no local mass  NEUROLOGIC EXAM:  biceps reflexes show normal relaxation    Assessment   Hypothyroidism, postsurgical with stabilization of TSH on 112 mcg dose of levothyroxine Her thyroidectomy scar is well healed although she is still having some paresthesiae locally  No symptoms suggestive of hypocalcemia now   Treatment:  She will continue the dose of Synthroid to 112 and have her followup in 6 months Discussed that she should take this consistently before breakfast without any interacting substances  Claudia Cantrell 06/29/2014, 10:30 AM

## 2014-08-09 ENCOUNTER — Ambulatory Visit (INDEPENDENT_AMBULATORY_CARE_PROVIDER_SITE_OTHER): Payer: Medicare Other | Admitting: General Surgery

## 2014-12-18 ENCOUNTER — Other Ambulatory Visit: Payer: Self-pay | Admitting: Endocrinology

## 2014-12-27 ENCOUNTER — Other Ambulatory Visit (INDEPENDENT_AMBULATORY_CARE_PROVIDER_SITE_OTHER): Payer: PPO

## 2014-12-27 DIAGNOSIS — E890 Postprocedural hypothyroidism: Secondary | ICD-10-CM

## 2014-12-27 LAB — T4, FREE: FREE T4: 0.94 ng/dL (ref 0.60–1.60)

## 2014-12-27 LAB — TSH: TSH: 7.38 u[IU]/mL — ABNORMAL HIGH (ref 0.35–4.50)

## 2014-12-30 ENCOUNTER — Encounter: Payer: Self-pay | Admitting: Endocrinology

## 2014-12-30 ENCOUNTER — Ambulatory Visit (INDEPENDENT_AMBULATORY_CARE_PROVIDER_SITE_OTHER): Payer: PPO | Admitting: Endocrinology

## 2014-12-30 VITALS — BP 128/86 | HR 89 | Temp 98.9°F | Ht 65.0 in | Wt 231.0 lb

## 2014-12-30 DIAGNOSIS — E890 Postprocedural hypothyroidism: Secondary | ICD-10-CM

## 2014-12-30 DIAGNOSIS — Z23 Encounter for immunization: Secondary | ICD-10-CM

## 2014-12-30 MED ORDER — LEVOTHYROXINE SODIUM 125 MCG PO TABS: 125.0000 ug | ORAL_TABLET | Freq: Every day | ORAL | Status: DC

## 2014-12-30 NOTE — Progress Notes (Signed)
Claudia Cantrell 69 y.o.   Reason for Appointment:  Hypothyroidism, postsurgical    History of Present Illness:   Background information: She had a large multinodular goiter causing local pressure symptoms and was referred to the surgeon last year She finally had her thyroidectomy in 11/2013. Pathology revealed that her thyroid size was 108 gm She did have transient hypocalcemia and was given calcium supplements by her surgeon. Does not have any symptoms now. Also has had some hoarseness but this is improving She does have significant improvement in her symptoms of choking and difficulty breathing even with exertion  Hypothyroidism:The patient had been treated with 125 mcg of levothyroxine since 2/15 and this was reduced to 112 in 4/15 when TSH was low normal On the last visit her TSH was excellent at 0.85 and she was feeling fairly good  However since about late last year she thinks she has been gradually getting more tired.  Also recently getting chilled more easily Also has gained weight although previously was on a weight-loss drug             The patient is taking the thyroid supplement  regularly in the morning before breakfast.  Not taking any calcium or iron supplements with the thyroid supplement.    Lab Results  Component Value Date   TSH 7.38* 12/27/2014   TSH 0.85 06/24/2014   TSH 0.48 03/15/2014   FREET4 0.94 12/27/2014   FREET4 0.96 06/24/2014   FREET4 1.05 03/15/2014       Medication List       This list is accurate as of: 12/30/14  8:40 AM.  Always use your most recent med list.               amLODipine 10 MG tablet  Commonly known as:  NORVASC  Take 10 mg by mouth daily.     aspirin 81 MG tablet  Take 81 mg by mouth daily.     multivitamin with minerals Tabs tablet  Take 1 tablet by mouth daily.     Phentermine-Topiramate 3.75-23 MG Cp24  Take 3.75 mg by mouth daily.     rosuvastatin 20 MG tablet  Commonly known as:  CRESTOR  Take 20 mg by  mouth daily.     valsartan-hydrochlorothiazide 320-12.5 MG per tablet  Commonly known as:  DIOVAN-HCT  Take 1 tablet by mouth daily.        Allergies:  Allergies  Allergen Reactions  . Codeine Anxiety    Past Medical History  Diagnosis Date  . Hyperlipidemia   . Arthritis     rheumatoid  . Myocardial infarction     92  . Anginal pain     occ  . Hypertension   . Shortness of breath     exersional  . Sleep apnea     cpap used 2 yrs  . GERD (gastroesophageal reflux disease)     occ  . Cancer   . Family history of anesthesia complication     daughter has difficulty waking     Past Surgical History  Procedure Laterality Date  . Abdominal hysterectomy  1988  . Back surgery  1994  . Thyroidectomy  11/24/2013    DR Dalbert Batman  . Thyroidectomy N/A 11/24/2013    Procedure: TOTAL THYROIDECTOMY;  Surgeon: Adin Hector, MD;  Location: [facility];  Service: General;  Laterality: N/A;    Family History  Problem Relation Age of Onset  . Heart disease Mother   . Diabetes  Father     Social History:  reports that she quit smoking about 24 years ago. Her smoking use included Cigarettes. She has a 15 pack-year smoking history. She has never used smokeless tobacco. She reports that she drinks alcohol. She reports that she does not use illicit drugs.  REVIEW Of SYSTEMS:  She is gaining weight She complains of craving sweets  No recent swelling of her ankles   She thinks her hair loss is better now   Examination:   BP 128/86 mmHg  Pulse 89  Temp(Src) 98.9 F (37.2 C) (Oral)  Ht 5\' 5"  (1.651 m)  Wt 231 lb (104.781 kg)  BMI 38.44 kg/m2  SpO2 98%  General: She looks well and has no puffiness of her face  NECK:  scar of thyroidectomy present, exam is normal  NEUROLOGIC EXAM:  biceps reflexes show relatively slow relaxation Skin does not appear unusually dry and no peripheral edema present   Assessment   Hypothyroidism, postsurgical with relatively higher TSH now with  symptoms of fatigue with 112 g dosage Since she had missed her medication the day before her lab will not increase her dose by more than 13 g at this time Also she has gained weight since her last visit  However fatigue may be partly related to other reasons including mild depression   Treatment:  She will go up to 125 g and follow up in 6 weeks with repeat level  She wants to have her influenza vaccine and Prevnar and this was given today  Claudia Cantrell 12/30/2014, 8:40 AM

## 2014-12-30 NOTE — Patient Instructions (Signed)
Start new dose

## 2014-12-30 NOTE — Addendum Note (Signed)
Addended by: Moody Bruins E on: 12/30/2014 09:54 AM   Modules accepted: Orders

## 2015-01-14 ENCOUNTER — Other Ambulatory Visit (INDEPENDENT_AMBULATORY_CARE_PROVIDER_SITE_OTHER): Payer: PPO

## 2015-01-14 DIAGNOSIS — E890 Postprocedural hypothyroidism: Secondary | ICD-10-CM

## 2015-01-14 LAB — TSH: TSH: 4.04 u[IU]/mL (ref 0.35–4.50)

## 2015-01-19 ENCOUNTER — Ambulatory Visit (INDEPENDENT_AMBULATORY_CARE_PROVIDER_SITE_OTHER): Payer: PPO | Admitting: Endocrinology

## 2015-01-19 VITALS — BP 144/78 | HR 69 | Wt 232.4 lb

## 2015-01-19 DIAGNOSIS — E890 Postprocedural hypothyroidism: Secondary | ICD-10-CM

## 2015-01-19 NOTE — Progress Notes (Signed)
Claudia Cantrell 69 y.o.   Reason for Appointment:  Hypothyroidism, postsurgical    History of Present Illness:   Background information: She had a large multinodular goiter causing local pressure symptoms and was referred to the surgeon last year She finally had her thyroidectomy in 11/2013. Pathology revealed that her thyroid size was 108 gm She did have transient hypocalcemia and was given calcium supplements by her surgeon. Does not have any symptoms now. Also has had some hoarseness but this is improving She does have significant improvement in her symptoms of choking and difficulty breathing even with exertion  Hypothyroidism: The patient had been treated with 125 mcg of levothyroxine since 2/15 and this was reduced to 112 in 4/15 when TSH was low normal On her last visit in 1/16 she was complaining about getting more tired, feeling chilled On this visit her TSH was high at 7.4, also at the same time had gained weight.  Has not gained any more weight; previously was on a weight-loss drug             The patient is taking the thyroid supplement  regularly in the morning about an hour before breakfast.  Not taking any calcium or iron supplements with the thyroid supplement.    Lab Results  Component Value Date   TSH 4.04 01/14/2015   TSH 7.38* 12/27/2014   TSH 0.85 06/24/2014   FREET4 0.94 12/27/2014   FREET4 0.96 06/24/2014   FREET4 1.05 03/15/2014       Medication List       This list is accurate as of: 01/19/15  9:43 AM.  Always use your most recent med list.               amLODipine 10 MG tablet  Commonly known as:  NORVASC  Take 10 mg by mouth daily.     aspirin 81 MG tablet  Take 81 mg by mouth daily.     levothyroxine 125 MCG tablet  Commonly known as:  SYNTHROID, LEVOTHROID  Take 1 tablet (125 mcg total) by mouth daily.     multivitamin with minerals Tabs tablet  Take 1 tablet by mouth daily.     rosuvastatin 20 MG tablet  Commonly known as:   CRESTOR  Take 20 mg by mouth daily.     valsartan-hydrochlorothiazide 320-12.5 MG per tablet  Commonly known as:  DIOVAN-HCT  Take 1 tablet by mouth daily.        Allergies:  Allergies  Allergen Reactions  . Codeine Anxiety    Past Medical History  Diagnosis Date  . Hyperlipidemia   . Arthritis     rheumatoid  . Myocardial infarction     92  . Anginal pain     occ  . Hypertension   . Shortness of breath     exersional  . Sleep apnea     cpap used 2 yrs  . GERD (gastroesophageal reflux disease)     occ  . Cancer   . Family history of anesthesia complication     daughter has difficulty waking     Past Surgical History  Procedure Laterality Date  . Abdominal hysterectomy  1988  . Back surgery  1994  . Thyroidectomy  11/24/2013    DR Dalbert Batman  . Thyroidectomy N/A 11/24/2013    Procedure: TOTAL THYROIDECTOMY;  Surgeon: Adin Hector, MD;  Location: [facility];  Service: General;  Laterality: N/A;    Family History  Problem Relation Age of Onset  .  Heart disease Mother   . Diabetes Father     Social History:  reports that she quit smoking about 24 years ago. Her smoking use included Cigarettes. She has a 15 pack-year smoking history. She has never used smokeless tobacco. She reports that she drinks alcohol. She reports that she does not use illicit drugs.  REVIEW Of SYSTEMS:  She is gaining weight  Wt Readings from Last 3 Encounters:  01/19/15 232 lb 6.4 oz (105.416 kg)  12/30/14 231 lb (104.781 kg)  06/29/14 222 lb (100.699 kg)   She complains of craving sweets and depression, has not followed up with PCP  Asking about probiotics for energy    Examination:   BP 144/78 mmHg  Pulse 69  Wt 232 lb 6.4 oz (105.416 kg)    NEUROLOGIC EXAM:  biceps reflexes show normal relaxation No peripheral edema    Assessment   Hypothyroidism, postsurgical with improved TSH with going up to 125 g Since it has been a short interval since her dosage change will  continue the same regimen and have her take her medication regularly in the morning Discussed timing of taking the medication in the morning and interacting substances  She was recommended follow-up with PCP for other questions including depressed mood, probiotics and weight management    Treatment:  She will stay on 125 g and follow up in 3 months with repeat level    Boyce Keltner 01/19/2015, 9:43 AM

## 2015-02-17 ENCOUNTER — Ambulatory Visit: Payer: PPO | Admitting: Endocrinology

## 2015-04-15 ENCOUNTER — Other Ambulatory Visit (INDEPENDENT_AMBULATORY_CARE_PROVIDER_SITE_OTHER): Payer: PPO

## 2015-04-15 DIAGNOSIS — E890 Postprocedural hypothyroidism: Secondary | ICD-10-CM

## 2015-04-15 LAB — T4, FREE: Free T4: 1.21 ng/dL (ref 0.60–1.60)

## 2015-04-15 LAB — TSH: TSH: 0.57 u[IU]/mL (ref 0.35–4.50)

## 2015-04-20 ENCOUNTER — Ambulatory Visit: Payer: PPO | Admitting: Endocrinology

## 2015-04-20 ENCOUNTER — Encounter: Payer: PPO | Admitting: Endocrinology

## 2015-04-20 ENCOUNTER — Other Ambulatory Visit: Payer: Self-pay | Admitting: Endocrinology

## 2015-04-20 ENCOUNTER — Encounter: Payer: Self-pay | Admitting: *Deleted

## 2015-04-20 NOTE — Progress Notes (Deleted)
Claudia Cantrell 69 y.o.   Reason for Appointment:  Hypothyroidism, postsurgical    History of Present Illness:   Background information: She had a large multinodular goiter causing local pressure symptoms and was referred to the surgeon last year She finally had her thyroidectomy in 11/2013. Pathology revealed that her thyroid size was 108 gm She did have transient hypocalcemia and was given calcium supplements by her surgeon. Does not have any symptoms now. Also has had some hoarseness but this is improving She does have significant improvement in her symptoms of choking and difficulty breathing even with exertion  Hypothyroidism:  The patient had been treated with 125 mcg of levothyroxine since 2/15 and this was reduced to 112 in 4/15 when TSH was low normal On her  visit in 1/16 she was complaining about getting more tired, feeling chilled On this visit her TSH was high at 7.4, also at the same time had gained weight.  Has not gained any more weight; previously was on a weight-loss drug             The patient is taking the thyroid supplement  regularly in the morning about an hour before breakfast.  Not taking any calcium or iron supplements with the thyroid supplement.    Lab Results  Component Value Date   TSH 0.57 04/15/2015   TSH 4.04 01/14/2015   TSH 7.38* 12/27/2014   FREET4 1.21 04/15/2015   FREET4 0.94 12/27/2014   FREET4 0.96 06/24/2014       Medication List       This list is accurate as of: 04/20/15 10:42 AM.  Always use your most recent med list.               amLODipine 10 MG tablet  Commonly known as:  NORVASC  Take 10 mg by mouth daily.     aspirin 81 MG tablet  Take 81 mg by mouth daily.     levothyroxine 125 MCG tablet  Commonly known as:  SYNTHROID, LEVOTHROID  Take 1 tablet (125 mcg total) by mouth daily.     multivitamin with minerals Tabs tablet  Take 1 tablet by mouth daily.     rosuvastatin 20 MG tablet  Commonly known as:  CRESTOR   Take 20 mg by mouth daily.     valsartan-hydrochlorothiazide 320-12.5 MG per tablet  Commonly known as:  DIOVAN-HCT  Take 1 tablet by mouth daily.        Allergies:  Allergies  Allergen Reactions  . Codeine Anxiety    Past Medical History  Diagnosis Date  . Hyperlipidemia   . Arthritis     rheumatoid  . Myocardial infarction     92  . Anginal pain     occ  . Hypertension   . Shortness of breath     exersional  . Sleep apnea     cpap used 2 yrs  . GERD (gastroesophageal reflux disease)     occ  . Cancer   . Family history of anesthesia complication     daughter has difficulty waking     Past Surgical History  Procedure Laterality Date  . Abdominal hysterectomy  1988  . Back surgery  1994  . Thyroidectomy  11/24/2013    DR Dalbert Batman  . Thyroidectomy N/A 11/24/2013    Procedure: TOTAL THYROIDECTOMY;  Surgeon: Adin Hector, MD;  Location: [facility];  Service: General;  Laterality: N/A;    Family History  Problem Relation Age of Onset  .  Heart disease Mother   . Diabetes Father     Social History:  reports that she quit smoking about 24 years ago. Her smoking use included Cigarettes. She has a 15 pack-year smoking history. She has never used smokeless tobacco. She reports that she drinks alcohol. She reports that she does not use illicit drugs.  REVIEW Of SYSTEMS:  She is gaining weight  Wt Readings from Last 3 Encounters:  01/19/15 232 lb 6.4 oz (105.416 kg)  12/30/14 231 lb (104.781 kg)  06/29/14 222 lb (100.699 kg)   She complains of craving sweets and depression, has not followed up with PCP  Asking about probiotics for energy    Examination:   Ht 5\' 5"  (1.651 m)    NEUROLOGIC EXAM:  biceps reflexes show normal relaxation No peripheral edema    Assessment   Hypothyroidism, postsurgical with improved TSH with going up to 125 g Since it has been a short interval since her dosage change will continue the same regimen and have her take her  medication regularly in the morning Discussed timing of taking the medication in the morning and interacting substances  She was recommended follow-up with PCP for other questions including depressed mood, probiotics and weight management    Treatment:  She will stay on 125 g and follow up in 3 months with repeat level    Berlin Mokry 04/20/2015, 10:42 AM

## 2015-04-20 NOTE — Progress Notes (Signed)
This encounter was created in error - please disregard.

## 2015-04-24 ENCOUNTER — Inpatient Hospital Stay (HOSPITAL_COMMUNITY)
Admission: EM | Admit: 2015-04-24 | Discharge: 2015-04-29 | DRG: 494 | Disposition: A | Discharge status: Skilled Nursing Facility | Payer: PPO | Attending: Orthopaedic Surgery | Admitting: Orthopaedic Surgery

## 2015-04-24 ENCOUNTER — Emergency Department (HOSPITAL_COMMUNITY): Payer: PPO

## 2015-04-24 ENCOUNTER — Inpatient Hospital Stay (HOSPITAL_COMMUNITY): Payer: PPO

## 2015-04-24 ENCOUNTER — Encounter (HOSPITAL_COMMUNITY): Payer: Self-pay | Admitting: *Deleted

## 2015-04-24 DIAGNOSIS — Z7982 Long term (current) use of aspirin: Secondary | ICD-10-CM | POA: Diagnosis not present

## 2015-04-24 DIAGNOSIS — E785 Hyperlipidemia, unspecified: Secondary | ICD-10-CM | POA: Diagnosis present

## 2015-04-24 DIAGNOSIS — I252 Old myocardial infarction: Secondary | ICD-10-CM | POA: Diagnosis not present

## 2015-04-24 DIAGNOSIS — W1839XA Other fall on same level, initial encounter: Secondary | ICD-10-CM | POA: Diagnosis present

## 2015-04-24 DIAGNOSIS — M069 Rheumatoid arthritis, unspecified: Secondary | ICD-10-CM | POA: Diagnosis present

## 2015-04-24 DIAGNOSIS — Z79899 Other long term (current) drug therapy: Secondary | ICD-10-CM | POA: Diagnosis not present

## 2015-04-24 DIAGNOSIS — S82891A Other fracture of right lower leg, initial encounter for closed fracture: Secondary | ICD-10-CM | POA: Diagnosis present

## 2015-04-24 DIAGNOSIS — Z885 Allergy status to narcotic agent status: Secondary | ICD-10-CM | POA: Diagnosis not present

## 2015-04-24 DIAGNOSIS — Z90710 Acquired absence of both cervix and uterus: Secondary | ICD-10-CM | POA: Diagnosis not present

## 2015-04-24 DIAGNOSIS — Z6838 Body mass index (BMI) 38.0-38.9, adult: Secondary | ICD-10-CM

## 2015-04-24 DIAGNOSIS — E039 Hypothyroidism, unspecified: Secondary | ICD-10-CM | POA: Diagnosis present

## 2015-04-24 DIAGNOSIS — Z87891 Personal history of nicotine dependence: Secondary | ICD-10-CM | POA: Diagnosis not present

## 2015-04-24 DIAGNOSIS — M25571 Pain in right ankle and joints of right foot: Secondary | ICD-10-CM | POA: Diagnosis present

## 2015-04-24 DIAGNOSIS — I10 Essential (primary) hypertension: Secondary | ICD-10-CM | POA: Diagnosis present

## 2015-04-24 DIAGNOSIS — Y9301 Activity, walking, marching and hiking: Secondary | ICD-10-CM | POA: Diagnosis not present

## 2015-04-24 DIAGNOSIS — K219 Gastro-esophageal reflux disease without esophagitis: Secondary | ICD-10-CM | POA: Diagnosis present

## 2015-04-24 DIAGNOSIS — S82841A Displaced bimalleolar fracture of right lower leg, initial encounter for closed fracture: Secondary | ICD-10-CM

## 2015-04-24 DIAGNOSIS — T148XXA Other injury of unspecified body region, initial encounter: Secondary | ICD-10-CM

## 2015-04-24 DIAGNOSIS — G4730 Sleep apnea, unspecified: Secondary | ICD-10-CM | POA: Diagnosis present

## 2015-04-24 DIAGNOSIS — S82843A Displaced bimalleolar fracture of unspecified lower leg, initial encounter for closed fracture: Secondary | ICD-10-CM | POA: Diagnosis present

## 2015-04-24 DIAGNOSIS — S82851A Displaced trimalleolar fracture of right lower leg, initial encounter for closed fracture: Principal | ICD-10-CM | POA: Diagnosis present

## 2015-04-24 DIAGNOSIS — Z419 Encounter for procedure for purposes other than remedying health state, unspecified: Secondary | ICD-10-CM

## 2015-04-24 LAB — CBC WITH DIFFERENTIAL/PLATELET
BASOS PCT: 0 % (ref 0–1)
Basophils Absolute: 0 10*3/uL (ref 0.0–0.1)
Eosinophils Absolute: 0.1 10*3/uL (ref 0.0–0.7)
Eosinophils Relative: 2 % (ref 0–5)
HCT: 47.5 % — ABNORMAL HIGH (ref 36.0–46.0)
Hemoglobin: 15.7 g/dL — ABNORMAL HIGH (ref 12.0–15.0)
LYMPHS ABS: 1 10*3/uL (ref 0.7–4.0)
Lymphocytes Relative: 18 % (ref 12–46)
MCH: 29.6 pg (ref 26.0–34.0)
MCHC: 33.1 g/dL (ref 30.0–36.0)
MCV: 89.6 fL (ref 78.0–100.0)
MONOS PCT: 8 % (ref 3–12)
Monocytes Absolute: 0.5 10*3/uL (ref 0.1–1.0)
Neutro Abs: 3.9 10*3/uL (ref 1.7–7.7)
Neutrophils Relative %: 72 % (ref 43–77)
Platelets: 243 10*3/uL (ref 150–400)
RBC: 5.3 MIL/uL — AB (ref 3.87–5.11)
RDW: 13.9 % (ref 11.5–15.5)
WBC: 5.5 10*3/uL (ref 4.0–10.5)

## 2015-04-24 LAB — PROTIME-INR
INR: 0.94 (ref 0.00–1.49)
Prothrombin Time: 12.7 seconds (ref 11.6–15.2)

## 2015-04-24 LAB — COMPREHENSIVE METABOLIC PANEL
ALBUMIN: 4.6 g/dL (ref 3.5–5.0)
ALT: 24 U/L (ref 14–54)
AST: 28 U/L (ref 15–41)
Alkaline Phosphatase: 98 U/L (ref 38–126)
Anion gap: 11 (ref 5–15)
BUN: 13 mg/dL (ref 6–20)
CALCIUM: 9.5 mg/dL (ref 8.9–10.3)
CO2: 25 mmol/L (ref 22–32)
CREATININE: 0.88 mg/dL (ref 0.44–1.00)
Chloride: 103 mmol/L (ref 101–111)
GLUCOSE: 108 mg/dL — AB (ref 65–99)
Potassium: 3.5 mmol/L (ref 3.5–5.1)
SODIUM: 139 mmol/L (ref 135–145)
Total Bilirubin: 0.6 mg/dL (ref 0.3–1.2)
Total Protein: 8.2 g/dL — ABNORMAL HIGH (ref 6.5–8.1)

## 2015-04-24 MED ORDER — MORPHINE SULFATE 2 MG/ML IJ SOLN
1.0000 mg | INTRAMUSCULAR | Status: DC | PRN
  Administered 2015-04-27: 1 mg via INTRAVENOUS
  Filled 2015-04-24: qty 1

## 2015-04-24 MED ORDER — ONDANSETRON HCL 4 MG/2ML IJ SOLN
4.0000 mg | Freq: Once | INTRAMUSCULAR | Status: AC
  Administered 2015-04-24: 4 mg via INTRAVENOUS
  Filled 2015-04-24: qty 2

## 2015-04-24 MED ORDER — AMLODIPINE BESYLATE 10 MG PO TABS
10.0000 mg | ORAL_TABLET | Freq: Every day | ORAL | Status: DC
  Administered 2015-04-25 – 2015-04-29 (×5): 10 mg via ORAL
  Filled 2015-04-24 (×5): qty 1

## 2015-04-24 MED ORDER — FENTANYL CITRATE (PF) 100 MCG/2ML IJ SOLN
50.0000 ug | Freq: Once | INTRAMUSCULAR | Status: AC
  Administered 2015-04-24: 50 ug via INTRAVENOUS
  Filled 2015-04-24: qty 2

## 2015-04-24 MED ORDER — ACETAMINOPHEN 650 MG RE SUPP: 650.0000 mg | Freq: Four times a day (QID) | RECTAL | Status: DC | PRN

## 2015-04-24 MED ORDER — VALSARTAN-HYDROCHLOROTHIAZIDE 320-12.5 MG PO TABS: 1.0000 | ORAL_TABLET | Freq: Every day | ORAL | Status: DC

## 2015-04-24 MED ORDER — SORBITOL 70 % SOLN: 30.0000 mL | Freq: Every day | Status: DC | PRN

## 2015-04-24 MED ORDER — POLYETHYLENE GLYCOL 3350 17 G PO PACK
17.0000 g | PACK | Freq: Every day | ORAL | Status: DC | PRN
Start: 2015-04-24 — End: 2015-04-29

## 2015-04-24 MED ORDER — IRBESARTAN 300 MG PO TABS
300.0000 mg | ORAL_TABLET | Freq: Every day | ORAL | Status: DC
  Administered 2015-04-25 – 2015-04-29 (×5): 300 mg via ORAL
  Filled 2015-04-24 (×5): qty 1

## 2015-04-24 MED ORDER — HYDROCHLOROTHIAZIDE 12.5 MG PO CAPS
12.5000 mg | ORAL_CAPSULE | Freq: Every day | ORAL | Status: DC
  Administered 2015-04-25 – 2015-04-29 (×5): 12.5 mg via ORAL
  Filled 2015-04-24 (×5): qty 1

## 2015-04-24 MED ORDER — ONDANSETRON HCL 4 MG PO TABS: 4.0000 mg | ORAL_TABLET | Freq: Four times a day (QID) | ORAL | Status: DC | PRN

## 2015-04-24 MED ORDER — LEVOTHYROXINE SODIUM 125 MCG PO TABS
125.0000 ug | ORAL_TABLET | Freq: Every day | ORAL | Status: DC
  Administered 2015-04-25 – 2015-04-29 (×5): 125 ug via ORAL
  Filled 2015-04-24 (×5): qty 1

## 2015-04-24 MED ORDER — ALUM & MAG HYDROXIDE-SIMETH 200-200-20 MG/5ML PO SUSP: 30.0000 mL | Freq: Four times a day (QID) | ORAL | Status: DC | PRN

## 2015-04-24 MED ORDER — ONDANSETRON HCL 4 MG/2ML IJ SOLN
4.0000 mg | Freq: Four times a day (QID) | INTRAMUSCULAR | Status: DC | PRN
  Administered 2015-04-24: 4 mg via INTRAVENOUS
  Filled 2015-04-24: qty 2

## 2015-04-24 MED ORDER — MAGNESIUM CITRATE PO SOLN: 1.0000 | Freq: Once | ORAL | Status: AC | PRN

## 2015-04-24 MED ORDER — HYDROCODONE-ACETAMINOPHEN 5-325 MG PO TABS
1.0000 | ORAL_TABLET | ORAL | Status: DC | PRN
  Administered 2015-04-25 (×2): 1 via ORAL
  Administered 2015-04-25: 2 via ORAL
  Administered 2015-04-26 (×3): 1 via ORAL
  Administered 2015-04-27 – 2015-04-28 (×2): 2 via ORAL
  Administered 2015-04-29 (×2): 1 via ORAL
  Filled 2015-04-24 (×2): qty 1
  Filled 2015-04-24: qty 2
  Filled 2015-04-24: qty 1
  Filled 2015-04-24 (×2): qty 2
  Filled 2015-04-24 (×4): qty 1
  Filled 2015-04-24: qty 2
  Filled 2015-04-24: qty 1

## 2015-04-24 MED ORDER — ROSUVASTATIN CALCIUM 10 MG PO TABS
20.0000 mg | ORAL_TABLET | Freq: Every day | ORAL | Status: DC
  Administered 2015-04-25 – 2015-04-29 (×5): 20 mg via ORAL
  Filled 2015-04-24 (×2): qty 2
  Filled 2015-04-24: qty 1
  Filled 2015-04-24 (×2): qty 2
  Filled 2015-04-24 (×2): qty 1
  Filled 2015-04-24: qty 2

## 2015-04-24 MED ORDER — HYDROMORPHONE HCL 1 MG/ML IJ SOLN
1.0000 mg | Freq: Once | INTRAMUSCULAR | Status: AC
  Administered 2015-04-24: 1 mg via INTRAVENOUS
  Filled 2015-04-24: qty 1

## 2015-04-24 MED ORDER — ACETAMINOPHEN 325 MG PO TABS: 650.0000 mg | ORAL_TABLET | Freq: Four times a day (QID) | ORAL | Status: DC | PRN

## 2015-04-24 NOTE — ED Notes (Signed)
Received report from [facility]; will move to Pod E when room is ready

## 2015-04-24 NOTE — ED Notes (Signed)
Pt reports she tripped while walking up an hill. Pt fell onto RT ankle and now has swelling and pain to ankle.

## 2015-04-24 NOTE — Progress Notes (Signed)
Orthopedic Tech Progress Note Patient Details:  Claudia Cantrell 28-Oct-1946 [id number]  Ortho Devices Type of Ortho Device: Crutches, Ace wrap, Post (short leg) splint, Stirrup splint Ortho Device/Splint Interventions: Application   Cammer, Theodoro Parma 04/24/2015, 9:35 PM

## 2015-04-24 NOTE — ED Notes (Signed)
Ortho tech aware of need for splint 

## 2015-04-24 NOTE — ED Provider Notes (Signed)
CSN: [id number]     Arrival date & time 04/24/15  1818 History   First MD Initiated Contact with Patient 04/24/15 1929     Chief Complaint  Patient presents with  . Ankle Pain     (Consider location/radiation/quality/duration/timing/severity/associated sxs/prior Treatment) Patient is a 69 y.o. female presenting with ankle pain. The history is provided by the patient.  Ankle Pain Location:  Ankle Injury: yes   Mechanism of injury: fall   Fall:    Fall occurred: walking a hill.   Point of impact:  Feet   Entrapped after fall: no   Ankle location:  R ankle Pain details:    Quality:  Aching   Radiates to:  Does not radiate Associated symptoms: no fever     Past Medical History  Diagnosis Date  . Hyperlipidemia   . Arthritis     rheumatoid  . Myocardial infarction     92  . Anginal pain     occ  . Hypertension   . Shortness of breath     exersional  . Sleep apnea     cpap used 2 yrs  . GERD (gastroesophageal reflux disease)     occ  . Cancer   . Family history of anesthesia complication     daughter has difficulty waking    Past Surgical History  Procedure Laterality Date  . Abdominal hysterectomy  1988  . Back surgery  1994  . Thyroidectomy  11/24/2013    DR Dalbert Batman  . Thyroidectomy N/A 11/24/2013    Procedure: TOTAL THYROIDECTOMY;  Surgeon: Adin Hector, MD;  Location: [facility];  Service: General;  Laterality: N/A;   Family History  Problem Relation Age of Onset  . Heart disease Mother   . Diabetes Father    History  Substance Use Topics  . Smoking status: Former Smoker -- 1.00 packs/day for 15 years    Types: Cigarettes    Quit date: 12/10/1990  . Smokeless tobacco: Never Used     Comment: occ alcohol  . Alcohol Use: Yes     Comment: very rare   OB History    No data available     Review of Systems  Constitutional: Negative for fever and chills.  Respiratory: Negative for cough and shortness of breath.   Gastrointestinal: Negative for  vomiting and abdominal pain.  All other systems reviewed and are negative.     Allergies  Codeine  Home Medications   Prior to Admission medications   Medication Sig Start Date End Date Taking? Authorizing Provider  amLODipine (NORVASC) 10 MG tablet Take 10 mg by mouth daily.  10/06/13  Yes Historical Provider, MD  aspirin 81 MG tablet Take 81 mg by mouth daily.   Yes Historical Provider, MD  Multiple Vitamin (MULTIVITAMIN WITH MINERALS) TABS Take 1 tablet by mouth daily.   Yes Historical Provider, MD  rosuvastatin (CRESTOR) 20 MG tablet Take 20 mg by mouth daily.   Yes Historical Provider, MD  SYNTHROID 125 MCG tablet TAKE ONE TABLET BY MOUTH ONCE DAILY 04/20/15  Yes [name], MD  valsartan-hydrochlorothiazide (DIOVAN-HCT) 320-12.5 MG per tablet Take 1 tablet by mouth daily. 10/06/13  Yes Historical Provider, MD   BP 126/71 mmHg  Pulse 78  Temp(Src) 98.4 F (36.9 C) (Oral)  Resp 16  Ht 5\' 5"  (1.651 m)  SpO2 97% Physical Exam  Constitutional: She is oriented to person, place, and time. She appears well-developed and well-nourished. No distress.  HENT:  Head: Normocephalic and atraumatic.  Mouth/Throat: Oropharynx is clear and moist.  Eyes: EOM are normal. Pupils are equal, round, and reactive to light.  Neck: Normal range of motion. Neck supple.  Cardiovascular: Normal rate and regular rhythm.  Exam reveals no friction rub.   No murmur heard. Pulmonary/Chest: Effort normal and breath sounds normal. No respiratory distress. She has no wheezes. She has no rales.  Abdominal: Soft. She exhibits no distension. There is no tenderness. There is no rebound.  Musculoskeletal: She exhibits no edema.       Right ankle: She exhibits decreased range of motion, swelling and ecchymosis. She exhibits no deformity, no laceration and normal pulse. Tenderness. Lateral malleolus and medial malleolus tenderness found. No CF ligament and no posterior TFL tenderness found.  Neurological: She is  alert and oriented to person, place, and time.  Skin: She is not diaphoretic.  Nursing note and vitals reviewed.   ED Course  Procedures (including critical care time) Labs Review Labs Reviewed  CBC WITH DIFFERENTIAL/PLATELET - Abnormal; Notable for the following:    RBC 5.30 (*)    Hemoglobin 15.7 (*)    HCT 47.5 (*)    All other components within normal limits  COMPREHENSIVE METABOLIC PANEL - Abnormal; Notable for the following:    Glucose, Bld 108 (*)    Total Protein 8.2 (*)    All other components within normal limits  PROTIME-INR    Imaging Review Dg Ankle Complete Right  04/24/2015   CLINICAL DATA:  Tripped over tree stump this afternoon with twisting injury and resulting pain and swelling right ankle.  EXAM: RIGHT ANKLE - COMPLETE 3+ VIEW  COMPARISON:  None.  FINDINGS: Moderate soft tissue swelling over the ankle. There is a displaced transverse fracture of the medial malleolus. Mild widening of the medial ankle mortise. There is a displaced oblique fracture of the distal fibular diaphysis 6 cm above the ankle mortise. Remainder the exam is within normal.  IMPRESSION: Displaced transverse fracture of the medial malleolus. Widening of the medial ankle mortise. Displaced oblique fracture of the distal fibular diaphysis.   Electronically Signed   By: Marin Olp M.D.   On: 04/24/2015 19:36     EKG Interpretation None      MDM   Final diagnoses:  Closed bimalleolar fracture, right, initial encounter    69 year old female here after a fall. She lost her footing on a hill and fell twisting her foot around as her son tried to catch her. She denied her head or lose consciousness. She's complaining of right ankle pain only. No other long bone deformities or tenderness. X-ray of the right ankle shows a by mouth fracture with displaced distal fibula fracture and mild mortise widening. I reviewed the films with Dr. Erlinda Hong who can follow her up in the clinic in the next 1-2  days. Patient has good pulses, good cap refill and motor function. Patient has some mild tingling of her right pinky toe, likely due to large amount of swelling at the lateral malleolus. Compartments of the leg are soft. Patient does not have any laxity in the joint when and is able to internally and externally rotate the leg without the ankle flopping around. Put in stirrup and posterior slab splint. Given pain medicine.  Dr. Erlinda Hong with Ortho will admit for further care as patient has poor home situation and no one to take care of her. He will see her in the morning. Patient was advised surgery will be, at the earliest, in 3 days once  swelling has improved, could be pushed back until Friday or even later. Patient is comfortable with this plan.  Evelina Bucy, MD 04/24/15 2203

## 2015-04-24 NOTE — Progress Notes (Signed)
Placed pt on nasal cpap at this time, pt tolerating well

## 2015-04-24 NOTE — ED Notes (Signed)
Ortho tech at bedside 

## 2015-04-24 NOTE — ED Notes (Signed)
Pt placed on 2L of oxygen. Sats 85-88% after given dilaudid

## 2015-04-24 NOTE — ED Notes (Signed)
Pt c/o R ankle pain. Pt was walking up a hill and son tried to catch pt. Pt's foot became tangled with son's foot and pt felt a pop. CMS intact. Obvious swelling noted. Pedal pulses present

## 2015-04-24 NOTE — ED Notes (Signed)
Pt expressing concerns about possible discharge. Reports living alone and has steps going into house. Pt does not have anyone home to help her get into house

## 2015-04-25 NOTE — H&P (Signed)
ORTHOPAEDIC HISTORY AND PHYSICAL   Chief Complaint: Right ankle fracture  HPI: Claudia Cantrell is a 69 y.o. female who complains of right ankle fracture s/p mechanical fall at home.  Lives alone and has 11 stairs to climb to get home.  Did not feel comfortable going home with crutches.  Ortho admitted for social issues.  Surgery will be done on a delayed basis.  Past Medical History  Diagnosis Date  . Hyperlipidemia   . Arthritis     rheumatoid  . Myocardial infarction     92  . Anginal pain     occ  . Hypertension   . Shortness of breath     exersional  . Sleep apnea     cpap used 2 yrs  . GERD (gastroesophageal reflux disease)     occ  . Cancer   . Family history of anesthesia complication     daughter has difficulty waking    Past Surgical History  Procedure Laterality Date  . Abdominal hysterectomy  1988  . Back surgery  1994  . Thyroidectomy  11/24/2013    DR Dalbert Batman  . Thyroidectomy N/A 11/24/2013    Procedure: TOTAL THYROIDECTOMY;  Surgeon: Adin Hector, MD;  Location: [facility];  Service: General;  Laterality: N/A;   History   Social History  . Marital Status: Divorced    Spouse Name: N/A  . Number of Children: N/A  . Years of Education: N/A   Social History Main Topics  . Smoking status: Former Smoker -- 1.00 packs/day for 15 years    Types: Cigarettes    Quit date: 12/10/1990  . Smokeless tobacco: Never Used     Comment: occ alcohol  . Alcohol Use: Yes     Comment: very rare  . Drug Use: No  . Sexual Activity: Not on file   Other Topics Concern  . None   Social History Narrative   Family History  Problem Relation Age of Onset  . Heart disease Mother   . Diabetes Father    Allergies  Allergen Reactions  . Codeine Anxiety   Prior to Admission medications   Medication Sig Start Date End Date Taking? Authorizing Provider  amLODipine (NORVASC) 10 MG tablet Take 10 mg by mouth daily.  10/06/13  Yes Historical Provider, MD  aspirin 81 MG  tablet Take 81 mg by mouth daily.   Yes Historical Provider, MD  Multiple Vitamin (MULTIVITAMIN WITH MINERALS) TABS Take 1 tablet by mouth daily.   Yes Historical Provider, MD  rosuvastatin (CRESTOR) 20 MG tablet Take 20 mg by mouth daily.   Yes Historical Provider, MD  SYNTHROID 125 MCG tablet TAKE ONE TABLET BY MOUTH ONCE DAILY 04/20/15  Yes [name], MD  valsartan-hydrochlorothiazide (DIOVAN-HCT) 320-12.5 MG per tablet Take 1 tablet by mouth daily. 10/06/13  Yes Historical Provider, MD   Dg Ankle Complete Right  04/24/2015   CLINICAL DATA:  Tripped over tree stump this afternoon with twisting injury and resulting pain and swelling right ankle.  EXAM: RIGHT ANKLE - COMPLETE 3+ VIEW  COMPARISON:  None.  FINDINGS: Moderate soft tissue swelling over the ankle. There is a displaced transverse fracture of the medial malleolus. Mild widening of the medial ankle mortise. There is a displaced oblique fracture of the distal fibular diaphysis 6 cm above the ankle mortise. Remainder the exam is within normal.  IMPRESSION: Displaced transverse fracture of the medial malleolus. Widening of the medial ankle mortise. Displaced oblique fracture of the distal  fibular diaphysis.   Electronically Signed   By: Marin Olp M.D.   On: 04/24/2015 19:36   Dg Tibia/fibula Right Port  04/24/2015   CLINICAL DATA:  Fracture right lower leg.  EXAM: PORTABLE RIGHT TIBIA AND FIBULA - 2 VIEW  COMPARISON:  None.  FINDINGS: Examination demonstrates evidence of patient's minimally displaced oblique fracture through the distal fibular diaphysis as well as displaced transverse medial malleolar fracture. Evidence of patient's displaced posterior malleolar fracture as well as anterior subluxation of the distal tibia on the talus.  IMPRESSION: Evidence of patient's displaced medial malleolar, posterior malleolar and distal fibular diaphyseal fractures as well as anterior subluxation of the tibia on the talus.   Electronically Signed   By:  Marin Olp M.D.   On: 04/24/2015 22:18    Positive ROS: All other systems have been reviewed and were otherwise negative with the exception of those mentioned in the HPI and as above.  Physical Exam: General: Alert, no acute distress Cardiovascular: No pedal edema Respiratory: No cyanosis, no use of accessory musculature GI: No organomegaly, abdomen is soft and non-tender Skin: No lesions in the area of chief complaint Neurologic: Sensation intact distally Psychiatric: Patient is competent for consent with normal mood and affect Lymphatic: No axillary or cervical lymphadenopathy  MUSCULOSKELETAL:  - well fitting splint - toes wwp, wiggling  Assessment: Right ankle fracture  Plan: - admit to ortho for elevation  - up with PT - will monitor swelling, possible surgery wed   N. Eduard Roux, MD [place] 7:35 AM

## 2015-04-25 NOTE — Progress Notes (Signed)
Placed patient on CPAP for the night.  Patient is tolerating well at this time. 

## 2015-04-26 LAB — HCG, SERUM, QUALITATIVE: PREG SERUM: NEGATIVE

## 2015-04-26 LAB — SURGICAL PCR SCREEN
MRSA, PCR: NEGATIVE
Staphylococcus aureus: NEGATIVE

## 2015-04-26 NOTE — Progress Notes (Signed)
Patient is stable.  Instructed the patient on the proper way to elevate. Will plan for surgery tomorrow NPO after midnight Consent signed.  Azucena Cecil, MD [place] 3:09 PM

## 2015-04-26 NOTE — Progress Notes (Signed)
RT assisted patient with CPAP machine. Humidity chamber is filled. 2L O2 bled in. RT will monitor.

## 2015-04-27 ENCOUNTER — Inpatient Hospital Stay (HOSPITAL_COMMUNITY): Payer: PPO

## 2015-04-27 ENCOUNTER — Inpatient Hospital Stay (HOSPITAL_COMMUNITY): Payer: PPO | Admitting: Anesthesiology

## 2015-04-27 ENCOUNTER — Encounter (HOSPITAL_COMMUNITY)
Admission: EM | Disposition: A | Discharge status: Skilled Nursing Facility | Payer: Self-pay | Source: Home / Self Care | Attending: Orthopaedic Surgery

## 2015-04-27 ENCOUNTER — Encounter (HOSPITAL_COMMUNITY): Payer: Self-pay | Admitting: Anesthesiology

## 2015-04-27 HISTORY — PX: ORIF ANKLE FRACTURE: SHX5408

## 2015-04-27 SURGERY — OPEN REDUCTION INTERNAL FIXATION (ORIF) ANKLE FRACTURE
Anesthesia: Choice | Laterality: Right

## 2015-04-27 MED ORDER — BUPIVACAINE-EPINEPHRINE (PF) 0.5% -1:200000 IJ SOLN
INTRAMUSCULAR | Status: DC | PRN
  Administered 2015-04-27: 30 mL via PERINEURAL

## 2015-04-27 MED ORDER — POLYETHYLENE GLYCOL 3350 17 G PO PACK: 17.0000 g | PACK | Freq: Every day | ORAL | Status: DC | PRN

## 2015-04-27 MED ORDER — EPHEDRINE SULFATE 50 MG/ML IJ SOLN
INTRAMUSCULAR | Status: DC | PRN
  Administered 2015-04-27 (×4): 10 mg via INTRAVENOUS

## 2015-04-27 MED ORDER — ACETAMINOPHEN 500 MG PO TABS
1000.0000 mg | ORAL_TABLET | Freq: Four times a day (QID) | ORAL | Status: AC
  Administered 2015-04-27 – 2015-04-28 (×3): 1000 mg via ORAL
  Filled 2015-04-27 (×3): qty 2

## 2015-04-27 MED ORDER — MORPHINE SULFATE 2 MG/ML IJ SOLN: 1.0000 mg | INTRAMUSCULAR | Status: DC | PRN

## 2015-04-27 MED ORDER — ONDANSETRON HCL 4 MG/2ML IJ SOLN
INTRAMUSCULAR | Status: DC | PRN
  Administered 2015-04-27: 4 mg via INTRAVENOUS

## 2015-04-27 MED ORDER — SORBITOL 70 % SOLN
30.0000 mL | Freq: Every day | Status: DC | PRN
  Administered 2015-04-28: 30 mL via ORAL
  Filled 2015-04-27: qty 30

## 2015-04-27 MED ORDER — MIDAZOLAM HCL 2 MG/2ML IJ SOLN
INTRAMUSCULAR | Status: AC
  Administered 2015-04-27: 2 mg
  Filled 2015-04-27: qty 2

## 2015-04-27 MED ORDER — LIDOCAINE HCL (CARDIAC) 20 MG/ML IV SOLN
INTRAVENOUS | Status: DC | PRN
  Administered 2015-04-27: 60 mg via INTRAVENOUS

## 2015-04-27 MED ORDER — METOCLOPRAMIDE HCL 5 MG PO TABS: 5.0000 mg | ORAL_TABLET | Freq: Three times a day (TID) | ORAL | Status: DC | PRN

## 2015-04-27 MED ORDER — PROPOFOL 10 MG/ML IV BOLUS
INTRAVENOUS | Status: DC | PRN
  Administered 2015-04-27: 200 mg via INTRAVENOUS

## 2015-04-27 MED ORDER — FENTANYL CITRATE (PF) 100 MCG/2ML IJ SOLN
INTRAMUSCULAR | Status: DC | PRN
  Administered 2015-04-27: 25 ug via INTRAVENOUS

## 2015-04-27 MED ORDER — CEFAZOLIN SODIUM-DEXTROSE 2-3 GM-% IV SOLR
2.0000 g | Freq: Four times a day (QID) | INTRAVENOUS | Status: AC
  Administered 2015-04-27 – 2015-04-28 (×2): 2 g via INTRAVENOUS
  Filled 2015-04-27 (×3): qty 50

## 2015-04-27 MED ORDER — ASPIRIN EC 325 MG PO TBEC
325.0000 mg | DELAYED_RELEASE_TABLET | Freq: Two times a day (BID) | ORAL | Status: DC
  Administered 2015-04-27 – 2015-04-29 (×4): 325 mg via ORAL
  Filled 2015-04-27 (×4): qty 1

## 2015-04-27 MED ORDER — EPHEDRINE SULFATE 50 MG/ML IJ SOLN
INTRAMUSCULAR | Status: AC
  Filled 2015-04-27: qty 1

## 2015-04-27 MED ORDER — PROPOFOL 10 MG/ML IV BOLUS
INTRAVENOUS | Status: AC
  Filled 2015-04-27: qty 20

## 2015-04-27 MED ORDER — ACETAMINOPHEN 650 MG RE SUPP: 650.0000 mg | Freq: Four times a day (QID) | RECTAL | Status: DC | PRN

## 2015-04-27 MED ORDER — MAGNESIUM CITRATE PO SOLN: 1.0000 | Freq: Once | ORAL | Status: AC | PRN

## 2015-04-27 MED ORDER — 0.9 % SODIUM CHLORIDE (POUR BTL) OPTIME
TOPICAL | Status: DC | PRN
  Administered 2015-04-27: 1000 mL

## 2015-04-27 MED ORDER — METOCLOPRAMIDE HCL 5 MG/ML IJ SOLN: 5.0000 mg | Freq: Three times a day (TID) | INTRAMUSCULAR | Status: DC | PRN

## 2015-04-27 MED ORDER — ACETAMINOPHEN 325 MG PO TABS: 650.0000 mg | ORAL_TABLET | Freq: Four times a day (QID) | ORAL | Status: DC | PRN

## 2015-04-27 MED ORDER — DEXTROSE 5 % IV SOLN
INTRAVENOUS | Status: DC | PRN
  Administered 2015-04-27: 14:00:00 via INTRAVENOUS

## 2015-04-27 MED ORDER — LIDOCAINE HCL (CARDIAC) 20 MG/ML IV SOLN
INTRAVENOUS | Status: AC
  Filled 2015-04-27: qty 5

## 2015-04-27 MED ORDER — LACTATED RINGERS IV SOLN
INTRAVENOUS | Status: DC
  Administered 2015-04-27 (×3): via INTRAVENOUS

## 2015-04-27 MED ORDER — FENTANYL CITRATE (PF) 100 MCG/2ML IJ SOLN
INTRAMUSCULAR | Status: AC
  Administered 2015-04-27: 100 ug
  Filled 2015-04-27: qty 2

## 2015-04-27 MED ORDER — ONDANSETRON HCL 4 MG PO TABS: 4.0000 mg | ORAL_TABLET | Freq: Four times a day (QID) | ORAL | Status: DC | PRN

## 2015-04-27 MED ORDER — MIDAZOLAM HCL 2 MG/2ML IJ SOLN
INTRAMUSCULAR | Status: AC
  Filled 2015-04-27: qty 2

## 2015-04-27 MED ORDER — ONDANSETRON HCL 4 MG/2ML IJ SOLN
INTRAMUSCULAR | Status: AC
  Filled 2015-04-27: qty 2

## 2015-04-27 MED ORDER — METHOCARBAMOL 1000 MG/10ML IJ SOLN
500.0000 mg | Freq: Four times a day (QID) | INTRAVENOUS | Status: DC | PRN
  Filled 2015-04-27: qty 5

## 2015-04-27 MED ORDER — METHOCARBAMOL 500 MG PO TABS
500.0000 mg | ORAL_TABLET | Freq: Four times a day (QID) | ORAL | Status: DC | PRN
  Administered 2015-04-28 – 2015-04-29 (×2): 500 mg via ORAL
  Filled 2015-04-27 (×2): qty 1

## 2015-04-27 MED ORDER — CEFAZOLIN SODIUM-DEXTROSE 2-3 GM-% IV SOLR
INTRAVENOUS | Status: DC | PRN
  Administered 2015-04-27: 2 g via INTRAVENOUS

## 2015-04-27 MED ORDER — ONDANSETRON HCL 4 MG/2ML IJ SOLN
4.0000 mg | Freq: Four times a day (QID) | INTRAMUSCULAR | Status: DC | PRN
Start: 2015-04-27 — End: 2015-04-29

## 2015-04-27 MED ORDER — FENTANYL CITRATE (PF) 250 MCG/5ML IJ SOLN
INTRAMUSCULAR | Status: AC
  Filled 2015-04-27: qty 5

## 2015-04-27 MED ORDER — SODIUM CHLORIDE 0.9 % IV SOLN
INTRAVENOUS | Status: DC
  Administered 2015-04-27 – 2015-04-28 (×2): via INTRAVENOUS

## 2015-04-27 MED ORDER — DIPHENHYDRAMINE HCL 12.5 MG/5ML PO ELIX: 25.0000 mg | ORAL_SOLUTION | ORAL | Status: DC | PRN

## 2015-04-27 MED ORDER — OXYCODONE HCL 5 MG PO TABS
5.0000 mg | ORAL_TABLET | ORAL | Status: DC | PRN
  Administered 2015-04-27 – 2015-04-28 (×3): 10 mg via ORAL
  Filled 2015-04-27 (×3): qty 2

## 2015-04-27 SURGICAL SUPPLY — 81 items
BANDAGE ELASTIC 4 VELCRO ST LF (GAUZE/BANDAGES/DRESSINGS) IMPLANT
BANDAGE ELASTIC 6 VELCRO ST LF (GAUZE/BANDAGES/DRESSINGS) ×1 IMPLANT
BANDAGE ESMARK 6X9 LF (GAUZE/BANDAGES/DRESSINGS) ×1 IMPLANT
BIT DRILL CANN 2.7 (BIT) ×2
BIT DRILL SRG 2.7XCANN AO CPLG (BIT) IMPLANT
BIT DRL SRG 2.7XCANN AO CPLNG (BIT) ×1
BNDG CMPR 9X6 STRL LF SNTH (GAUZE/BANDAGES/DRESSINGS) ×1
BNDG COHESIVE 4X5 TAN STRL (GAUZE/BANDAGES/DRESSINGS) ×1 IMPLANT
BNDG COHESIVE 6X5 TAN STRL LF (GAUZE/BANDAGES/DRESSINGS) ×3 IMPLANT
BNDG ESMARK 6X9 LF (GAUZE/BANDAGES/DRESSINGS) ×2
CANISTER SUCT 3000ML PPV (MISCELLANEOUS) ×2 IMPLANT
COVER SURGICAL LIGHT HANDLE (MISCELLANEOUS) ×2 IMPLANT
CUFF TOURNIQUET SINGLE 34IN LL (TOURNIQUET CUFF) IMPLANT
CUFF TOURNIQUET SINGLE 44IN (TOURNIQUET CUFF) ×1 IMPLANT
DRAPE C-ARM 42X72 X-RAY (DRAPES) ×2 IMPLANT
DRAPE C-ARMOR (DRAPES) ×2 IMPLANT
DRAPE IMP U-DRAPE 54X76 (DRAPES) ×2 IMPLANT
DRAPE INCISE IOBAN 66X45 STRL (DRAPES) ×2 IMPLANT
DRAPE SURG 17X23 STRL (DRAPES) ×1 IMPLANT
DRAPE U-SHAPE 47X51 STRL (DRAPES) ×4 IMPLANT
DRILL 2.6X122MM WL AO SHAFT (BIT) ×1 IMPLANT
DRSG PAD ABDOMINAL 8X10 ST (GAUZE/BANDAGES/DRESSINGS) ×1 IMPLANT
DURAPREP 26ML APPLICATOR (WOUND CARE) ×2 IMPLANT
ELECT CAUTERY BLADE 6.4 (BLADE) ×2 IMPLANT
ELECT REM PT RETURN 9FT ADLT (ELECTROSURGICAL) ×2
ELECTRODE REM PT RTRN 9FT ADLT (ELECTROSURGICAL) ×1 IMPLANT
FACESHIELD WRAPAROUND (MASK) ×2 IMPLANT
FACESHIELD WRAPAROUND OR TEAM (MASK) ×1 IMPLANT
GAUZE SPONGE 4X4 12PLY STRL (GAUZE/BANDAGES/DRESSINGS) ×2 IMPLANT
GAUZE XEROFORM 5X9 LF (GAUZE/BANDAGES/DRESSINGS) ×2 IMPLANT
GLOVE BIO SURGEON STRL SZ7 (GLOVE) ×2 IMPLANT
GLOVE BIOGEL PI IND STRL 6.5 (GLOVE) IMPLANT
GLOVE BIOGEL PI IND STRL 7.0 (GLOVE) IMPLANT
GLOVE BIOGEL PI INDICATOR 6.5 (GLOVE) ×1
GLOVE BIOGEL PI INDICATOR 7.0 (GLOVE) ×1
GLOVE BIOGEL PI ORTHO PRO SZ7 (GLOVE) ×1
GLOVE NEODERM STRL 7.5 LF PF (GLOVE) ×1 IMPLANT
GLOVE PI ORTHO PRO STRL SZ7 (GLOVE) IMPLANT
GLOVE SURG NEODERM 7.5  LF PF (GLOVE) ×1
GLOVE SURG SS PI 6.5 STRL IVOR (GLOVE) ×1 IMPLANT
GLOVE SURG SYN 7.5  E (GLOVE) ×2
GLOVE SURG SYN 7.5 E (GLOVE) ×2 IMPLANT
GLOVE SURG SYN 7.5 PF PI (GLOVE) ×2 IMPLANT
GOWN STRL REIN XL XLG (GOWN DISPOSABLE) ×2 IMPLANT
GOWN STRL REUS W/ TWL LRG LVL3 (GOWN DISPOSABLE) IMPLANT
GOWN STRL REUS W/TWL LRG LVL3 (GOWN DISPOSABLE) ×6
K-WIRE ORTHOPEDIC 1.4X150L (WIRE) ×4
KIT BASIN OR (CUSTOM PROCEDURE TRAY) ×2 IMPLANT
KIT ROOM TURNOVER OR (KITS) ×2 IMPLANT
KWIRE ORTHOPEDIC 1.4X150L (WIRE) IMPLANT
NDL HYPO 25GX1X1/2 BEV (NEEDLE) IMPLANT
NEEDLE HYPO 25GX1X1/2 BEV (NEEDLE) IMPLANT
NS IRRIG 1000ML POUR BTL (IV SOLUTION) ×3 IMPLANT
PACK ORTHO EXTREMITY (CUSTOM PROCEDURE TRAY) ×2 IMPLANT
PAD ARMBOARD 7.5X6 YLW CONV (MISCELLANEOUS) ×4 IMPLANT
PAD CAST 3X4 CTTN HI CHSV (CAST SUPPLIES) ×2 IMPLANT
PADDING CAST COTTON 3X4 STRL (CAST SUPPLIES) ×4
PADDING CAST COTTON 6X4 STRL (CAST SUPPLIES) ×2 IMPLANT
PLATE 8H 108MM (Plate) ×1 IMPLANT
SCREW 3.5X10MM (Screw) ×1 IMPLANT
SCREW BONE 14MMX3.5MM (Screw) ×3 IMPLANT
SCREW BONE 3.5X16MM (Screw) ×1 IMPLANT
SCREW BONE NON-LCKING 3.5X12MM (Screw) ×2 IMPLANT
SCREW CANNULATED 4.0X36MM FT (Screw) ×1 IMPLANT
SCREW CANNULATED 4.0X36MM PT (Screw) ×1 IMPLANT
SCREW CANNULATED 4.0X38MM (Screw) ×1 IMPLANT
SCREW NLOCK 3.5X55 (Screw) ×1 IMPLANT
SPLINT FIBERGLASS 4X15 (CAST SUPPLIES) ×1 IMPLANT
SPONGE GAUZE 4X4 12PLY STER LF (GAUZE/BANDAGES/DRESSINGS) ×1 IMPLANT
SPONGE LAP 18X18 X RAY DECT (DISPOSABLE) IMPLANT
SUCTION FRAZIER TIP 10 FR DISP (SUCTIONS) ×2 IMPLANT
SUT ETHILON 3 0 PS 1 (SUTURE) ×2 IMPLANT
SUT VIC AB 0 CT1 27 (SUTURE) ×2
SUT VIC AB 0 CT1 27XBRD ANBCTR (SUTURE) IMPLANT
SUT VIC AB 2-0 CT1 27 (SUTURE) ×4
SUT VIC AB 2-0 CT1 TAPERPNT 27 (SUTURE) IMPLANT
SYR CONTROL 10ML LL (SYRINGE) IMPLANT
TOWEL OR 17X24 6PK STRL BLUE (TOWEL DISPOSABLE) ×2 IMPLANT
TOWEL OR 17X26 10 PK STRL BLUE (TOWEL DISPOSABLE) ×4 IMPLANT
TUBE CONNECTING 12X1/4 (SUCTIONS) ×2 IMPLANT
WATER STERILE IRR 1000ML POUR (IV SOLUTION) ×2 IMPLANT

## 2015-04-27 NOTE — Anesthesia Procedure Notes (Addendum)
Anesthesia Regional Block:  Popliteal block  Pre-Anesthetic Checklist: ,, timeout performed, Correct Patient, Correct Site, Correct Laterality, Correct Procedure, Correct Position, site marked, Risks and benefits discussed,  Surgical consent,  Pre-op evaluation,  At surgeon's request and post-op pain management  Laterality: Right  Prep: chloraprep       Needles:  Injection technique: Single-shot  Needle Type: Echogenic Stimulator Needle     Needle Length: 9cm 9 cm Needle Gauge: 21 and 21 G    Additional Needles:  Procedures: ultrasound guided (picture in chart) and nerve stimulator Popliteal block  Nerve Stimulator or Paresthesia:  Response: plantar flexion of foot, 0.45 mA,   Additional Responses:   Narrative:  Start time: 04/27/2015 12:21 PM End time: 04/27/2015 12:34 PM Injection made incrementally with aspirations every 5 mL.  Performed by: Personally  Anesthesiologist: HODIERNE, ADAM  Additional Notes: Functioning IV was confirmed and monitors were applied.  A 54mm 21ga Arrow echogenic stimulator needle was used. Sterile prep and drape,hand hygiene and sterile gloves were used.  Negative aspiration and negative test dose prior to incremental administration of local anesthetic. The patient tolerated the procedure well.  Ultrasound guidance: relevent anatomy identified, needle position confirmed, local anesthetic spread visualized around nerve(s), vascular puncture avoided.  Image printed for medical record.    Procedure Name: LMA Insertion Date/Time: 04/27/2015 1:33 PM Performed by: Williemae Area B Pre-anesthesia Checklist: Patient identified, Emergency Drugs available, Suction available and Patient being monitored Patient Re-evaluated:Patient Re-evaluated prior to inductionOxygen Delivery Method: Circle system utilized Preoxygenation: Pre-oxygenation with 100% oxygen Intubation Type: IV induction Ventilation: Mask ventilation without difficulty LMA: LMA inserted LMA  Size: 4.0 Number of attempts: 1 Placement Confirmation: breath sounds checked- equal and bilateral and positive ETCO2 Tube secured with: Tape (taped across cheeks) Dental Injury: Teeth and Oropharynx as per pre-operative assessment

## 2015-04-27 NOTE — H&P (Signed)

## 2015-04-27 NOTE — Anesthesia Preprocedure Evaluation (Addendum)
Anesthesia Evaluation  Patient identified by MRN, date of birth, ID band Patient awake    Reviewed: Allergy & Precautions, NPO status , Patient's Chart, lab work & pertinent test results  Airway Mallampati: II  TM Distance: >3 FB Neck ROM: Full    Dental  (+) Teeth Intact, Partial Upper, Dental Advisory Given   Pulmonary shortness of breath, sleep apnea and Continuous Positive Airway Pressure Ventilation , former smoker,  breath sounds clear to auscultation        Cardiovascular hypertension, Pt. on medications + CAD and + Past MI Rhythm:regular Rate:Normal     Neuro/Psych    GI/Hepatic GERD-  ,  Endo/Other  Hypothyroidism Morbid obesity  Renal/GU      Musculoskeletal  (+) Arthritis -,   Abdominal   Peds  Hematology   Anesthesia Other Findings   Reproductive/Obstetrics                           Anesthesia Physical Anesthesia Plan  ASA: III  Anesthesia Plan: General and Regional   Post-op Pain Management: MAC Combined w/ Regional for Post-op pain   Induction: Intravenous  Airway Management Planned: LMA  Additional Equipment:   Intra-op Plan:   Post-operative Plan:   Informed Consent: I have reviewed the patients History and Physical, chart, labs and discussed the procedure including the risks, benefits and alternatives for the proposed anesthesia with the patient or authorized representative who has indicated his/her understanding and acceptance.     Plan Discussed with: Anesthesiologist, Surgeon and CRNA  Anesthesia Plan Comments:         Anesthesia Quick Evaluation

## 2015-04-27 NOTE — Discharge Instructions (Signed)
° ° °  1. Keep splint clean and dry °2. Elevate foot above level of the heart °3. Take aspirin to prevent blood clots °4. Take pain meds as needed °5. Strict non weight bearing to operative extremity ° °

## 2015-04-27 NOTE — Transfer of Care (Signed)
Immediate Anesthesia Transfer of Care Note  Patient: Claudia Cantrell  Procedure(s) Performed: Procedure(s): OPEN REDUCTION INTERNAL FIXATION (ORIF) RIGHT BIMALLEOLAR ANKLE FRACTURE WITH SYNDESMOSIS FIXATION (Right)  Patient Location: PACU  Anesthesia Type:General  Level of Consciousness: awake, sedated and patient cooperative  Airway & Oxygen Therapy: Patient Spontanous Breathing and Patient connected to nasal cannula oxygen  Post-op Assessment: Report given to RN, Post -op Vital signs reviewed and stable and Patient moving all extremities  Post vital signs: Reviewed and stable  Last Vitals:  Filed Vitals:   04/27/15 1312  BP: 123/58  Pulse: 73  Temp:   Resp: 14    Complications: No apparent anesthesia complications

## 2015-04-27 NOTE — Op Note (Signed)
Date of Surgery: 04/27/2015  INDICATIONS: Claudia Cantrell is a 69 y.o.-year-old female who sustained a right ankle fracture; she was indicated for open reduction and internal fixation due to the displaced nature of the articular fracture and came to the operating room today for this procedure. The patient did consent to the procedure after discussion of the risks and benefits.  PREOPERATIVE DIAGNOSIS: right trimalleolar ankle fracture with syndesmostic injury  POSTOPERATIVE DIAGNOSIS: Same.  PROCEDURE:  1. Open treatment of right ankle fracture with internal fixation. Trimalleolar w/o fixation of posterior malleolus CPT 27822.  2. Open treatment of syndesmosis with internal fixation.  SURGEON: N. Eduard Roux, M.D.  ASSIST: Leslie Andrea.  ANESTHESIA:  general, regional  TOURNIQUET TIME: 1 hr  IV FLUIDS AND URINE: See anesthesia.  ESTIMATED BLOOD LOSS: minimal mL.  IMPLANTS: [employer] Variax 8 hole straight fibula plate  COMPLICATIONS: None.  DESCRIPTION OF PROCEDURE: The patient was brought to the operating room and placed supine on the operating table.  The patient had been signed prior to the procedure and this was documented. The patient had the anesthesia placed by the anesthesiologist.  A nonsterile tourniquet was placed on the upper thigh.  The prep verification and incision time-outs were performed to confirm that this was the correct patient, site, side and location. The patient had an SCD on the opposite lower extremity. The patient did receive antibiotics prior to the incision and was re-dosed during the procedure as needed at indicated intervals.  The patient had the lower extremity prepped and draped in the standard surgical fashion.  The extremity was exsanguinated using an esmarch bandage and the tourniquet was inflated to 300 mm Hg.  A laterally-based incision over the distal fibula was used. Blunt dissection was taken down to level of the fascia. The superficial peroneal  nerve was not encountered. The fascia was incised in line with the incision. The peroneal muscles were elevated off of the fibula. The fibula fracture was exposed. The periosteum was elevated. We then obtained a reduction of the fracture using lobster clamps and this was held in place by clamping a 8 hole straight plate to the lateral aspect of the fibula. With the fracture reduced we took fluoroscopy to confirm this. We then placed 3 nonlocking screws proximal to the fracture and 2 nonlocking screws distal to the fracture. These were all nonlocking screws and had good bony purchase. The fracture orientation was essentially transverse and we had to plate it in a bridge fashion. We then turned our attention to the medial malleolus fracture. A curvilinear incision over the distal tibia was used. Blunt dissection was taken down to the level of the fracture. The saphenous neurovascular structures were identified and protected. The fracture was then exposed. Entrapped periosteum was removed from the fracture site. The joint was then visualized and thoroughly irrigated. We then obtained a reduction of the fracture. This was confirmed under fluoroscopy. 2 parallel K wires were then advanced up the medial malleolus and into the distal tibia. 2 fully threaded screws were then advanced up the K wires after drilling. The K wires were then removed. We then stressed the syndesmosis with an external rotation stress test and this demonstrated widening of the medial clear space. We then reduced the syndesmosis using a King tong clamp and a 55 mm syndesmotic screw was placed as parallel to the joint as possible. The clamp was then removed and the syndesmosis stayed reduced. Final fluoroscopy pictures were taken. The wounds were thoroughly irrigated  and closed in layer fashion using 0 Vicryl for the fascia, 2-0 Vicryl for the subcutaneous cutaneous layer, 3-0 nylon for the skin. Sterile dressings were applied. The foot was  immobilized in a short-leg splint. Patient tolerated the procedure well was extubated and transferred to the PACU in stable condition. All sponge counts were correct.  POSTOPERATIVE PLAN: Ms. Roane will remain nonweightbearing on this leg for approximately 8 weeks; Ms. Roeder will return for suture removal in 2 weeks.  He will be immobilized in a short leg splint and then transitioned to a CAM walker at his first follow up appointment.  Ms. Georgi will receive DVT prophylaxis based on other medications, activity level, and risk ratio of bleeding to thrombosis.  Azucena Cecil, MD [place] 3:12 PM

## 2015-04-27 NOTE — Anesthesia Postprocedure Evaluation (Signed)
Anesthesia Post Note  Patient: Claudia Cantrell  Procedure(s) Performed: Procedure(s) (LRB): OPEN REDUCTION INTERNAL FIXATION (ORIF) RIGHT BIMALLEOLAR ANKLE FRACTURE WITH SYNDESMOSIS FIXATION (Right)  Anesthesia type: General  Patient location: PACU  Post pain: Pain level controlled and Adequate analgesia  Post assessment: Post-op Vital signs reviewed, Patient's Cardiovascular Status Stable, Respiratory Function Stable, Patent Airway and Pain level controlled  Last Vitals:  Filed Vitals:   04/27/15 1545  BP: 135/55  Pulse: 75  Temp:   Resp: 12    Post vital signs: Reviewed and stable  Level of consciousness: awake, alert  and oriented  Complications: No apparent anesthesia complications

## 2015-04-28 ENCOUNTER — Encounter (HOSPITAL_COMMUNITY): Payer: Self-pay | Admitting: Orthopaedic Surgery

## 2015-04-28 LAB — BASIC METABOLIC PANEL
Anion gap: 7 (ref 5–15)
BUN: 12 mg/dL (ref 6–20)
CO2: 27 mmol/L (ref 22–32)
CREATININE: 0.85 mg/dL (ref 0.44–1.00)
Calcium: 8.2 mg/dL — ABNORMAL LOW (ref 8.9–10.3)
Chloride: 105 mmol/L (ref 101–111)
GFR calc Af Amer: >60 mL/min (ref >60)
GLUCOSE: 92 mg/dL (ref 65–99)
Potassium: 3.5 mmol/L (ref 3.5–5.1)
Sodium: 139 mmol/L (ref 135–145)

## 2015-04-28 LAB — CBC WITH DIFFERENTIAL/PLATELET
BASOS ABS: 0 10*3/uL (ref 0.0–0.1)
BASOS PCT: 0 % (ref 0–1)
EOS ABS: 0.1 10*3/uL (ref 0.0–0.7)
EOS PCT: 2 % (ref 0–5)
HCT: 38.7 % (ref 36.0–46.0)
Hemoglobin: 12.4 g/dL (ref 12.0–15.0)
Lymphocytes Relative: 24 % (ref 12–46)
Lymphs Abs: 1.2 10*3/uL (ref 0.7–4.0)
MCH: 28.9 pg (ref 26.0–34.0)
MCHC: 32 g/dL (ref 30.0–36.0)
MCV: 90.2 fL (ref 78.0–100.0)
Monocytes Absolute: 0.6 10*3/uL (ref 0.1–1.0)
Monocytes Relative: 12 % (ref 3–12)
Neutro Abs: 3.1 10*3/uL (ref 1.7–7.7)
Neutrophils Relative %: 62 % (ref 43–77)
PLATELETS: 214 10*3/uL (ref 150–400)
RBC: 4.29 MIL/uL (ref 3.87–5.11)
RDW: 13.9 % (ref 11.5–15.5)
WBC: 5 10*3/uL (ref 4.0–10.5)

## 2015-04-28 MED ORDER — OXYCODONE HCL 5 MG PO TABS: 5.0000 mg | ORAL_TABLET | ORAL | Status: DC | PRN

## 2015-04-28 MED ORDER — ASPIRIN EC 325 MG PO TBEC
325.0000 mg | DELAYED_RELEASE_TABLET | Freq: Two times a day (BID) | ORAL | Status: DC
Start: 2015-04-28 — End: 2016-08-02

## 2015-04-28 NOTE — Clinical Social Work Note (Signed)
Clinical Social Work Assessment  Patient Details  Name: Claudia Cantrell MRN: [id number] Date of Birth: [birth year]  Date of referral:  04/28/15               Reason for consult:  Discharge Planning, Facility Placement                Permission sought to share information with:   (Patient did not request information to be shared.) Permission granted to share information::  No  Name::        Agency::     Relationship::     Contact Information:     Housing/Transportation Living arrangements for the past 2 months:  Single Family Home Source of Information:  Patient Patient Interpreter Needed:  None Criminal Activity/Legal Involvement Pertinent to Current Situation/Hospitalization:  No - Comment as needed Significant Relationships:  Adult Children Lives with:  Self Do you feel safe going back to the place where you live?  No (High fall risk.) Need for family participation in patient care:  No (Coment)  Care giving concerns:  Patient expressed no concerns at this time.   Social Worker assessment / plan:  Patient received referral for possible SNF placement at time of discharge. CSW met with patient at bedside to discuss discharge disposition. Patient informed CSW patient agreeable to PT recommendation and would prefer SNF placement at [facility]. CSW to continue to follow and assist with discharge planning needs.   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  Patient/Family's Understanding of and Emotional Response to Diagnosis, Current Treatment, and Prognosis:  Patient understanding and agreeable to CSW plan of care.  Emotional Assessment Appearance:  Appears stated age Attitude/Demeanor/Rapport:  Other  (Pleasant) Affect (typically observed):  Appropriate, Pleasant, Happy Orientation:  Oriented to Self, Oriented to Place, Oriented to  Time, Oriented to Situation Alcohol / Substance use:  Not Applicable Psych involvement (Current and /or in the community):  No (Comment) (Not appropriate on this admission.)  Discharge Needs  Concerns to be addressed:  No discharge needs identified Readmission within the last 30 days:  No Current discharge risk:  None Barriers to Discharge:  No Barriers Identified   Caroline Sauger, LCSW 04/28/2015, 2:28 PM [phone]

## 2015-04-28 NOTE — Progress Notes (Signed)
Orthopedic Tech Progress Note Patient Details:  Claudia Cantrell 05/21/1946 [id number]  Ortho Devices Type of Ortho Device: Crutches, Ace wrap, Post (short leg) splint, Stirrup splint Ortho Device/Splint Location: trapeze bar patient helper Ortho Device/Splint Interventions: Application   Tacoya Altizer 04/28/2015, 10:02 AM

## 2015-04-28 NOTE — Addendum Note (Signed)
Addendum  created 04/28/15 1506 by Terrill Mohr, CRNA   Modules edited: Anesthesia Events, Narrator   Narrator:  Narrator: Event Log Edited

## 2015-04-28 NOTE — Procedures (Signed)
Pt placed on CPAP set at [facility] and 2L of oxygen.  Pt is resting comfortably at this time.

## 2015-04-28 NOTE — Progress Notes (Signed)
   Subjective:  Patient reports pain as mild.    Objective:   VITALS:   Filed Vitals:   04/27/15 2051 04/27/15 2148 04/28/15 0058 04/28/15 0628  BP: 132/59  123/58 123/55  Pulse: 69 71 75 75  Temp: 98.6 F (37 C)  97.7 F (36.5 C) 97.7 F (36.5 C)  TempSrc: Oral  Oral Oral  Resp:  14    Height:      Weight:      SpO2: 99% 99% 98% 96%    Neurologically intact Neurovascular intact Sensation intact distally Intact pulses distally Dorsiflexion/Plantar flexion intact Incision: dressing C/D/I and no drainage No cellulitis present Compartment soft   Lab Results  Component Value Date   WBC 5.5 04/24/2015   HGB 15.7* 04/24/2015   HCT 47.5* 04/24/2015   MCV 89.6 04/24/2015   PLT 243 04/24/2015     Assessment/Plan:  1 Day Post-Op   - Expected postop acute blood loss anemia - will monitor for symptoms - Up with PT/OT - likely needs SNF - DVT ppx - SCDs, ambulation, asa - NWB right lower extremity - Patient desires to go to [facility] - appreciate CM/SW assistance  Marianna Payment 04/28/2015, 7:42 AM [phone]

## 2015-04-28 NOTE — Evaluation (Signed)
Physical Therapy Evaluation Patient Details Name: Claudia Cantrell MRN: [id number] DOB: 1946-04-18 Today's Date: 04/28/2015   History of Present Illness  69 y.o. female s/p OPEN REDUCTION INTERNAL FIXATION (ORIF) RIGHT BIMALLEOLAR ANKLE FRACTURE WITH SYNDESMOSIS FIXATION   Clinical Impression  Patient is s/p above procedure, presenting with functional limitations due to the deficits listed below (see PT Problem List). Limited by nausea and dizziness today. Pt able to tolerate transfer training but deferred gait training until next visit. Limited family support and lives alone with stairs to enter home. Would benefit from [place] SNF to improve functional independence prior to returning home. Patient will benefit from skilled PT to increase their independence and safety with mobility to allow discharge to the venue listed below.       Follow Up Recommendations SNF;Supervision for mobility/OOB    Equipment Recommendations   (TBD at next venue of care.)    Recommendations for Other Services       Precautions / Restrictions Precautions Precautions: Fall Restrictions Weight Bearing Restrictions: Yes RLE Weight Bearing: Non weight bearing      Mobility  Bed Mobility Overal bed mobility: Modified Independent                Transfers Overall transfer level: Needs assistance Equipment used: Rolling walker (2 wheeled) Transfers: Sit to/from [facility] Sit to Stand: Min guard Stand pivot transfers: Min guard       General transfer comment: Min guard for safety with sit<>stand from lowest bed setting. VC for hand placement. Reported significant dizziness upon standing the first time. slight improvement second attempt. Able to pivot to chair with min guard assist, VC for sequencing. Denies ambulating due to dizziness. Able to maintain NWB on RLE during transfer training.  Ambulation/Gait                Stairs            Wheelchair Mobility    Modified  Rankin (Stroke Patients Only)       Balance Overall balance assessment: Needs assistance Sitting-balance support: No upper extremity supported;Feet supported Sitting balance-Leahy Scale: Good     Standing balance support: Single extremity supported Standing balance-Leahy Scale: Poor                               Pertinent Vitals/Pain Pain Assessment: 0-10 Pain Score: 7  Pain Location: Rt ankle Pain Descriptors / Indicators: Throbbing Pain Intervention(s): Monitored during session;Repositioned    Home Living Family/patient expects to be discharged to:: Private residence Living Arrangements: Alone Available Help at Discharge: Family (daughter - works) Type of Home: House Home Access: Stairs to enter Entrance Stairs-Rails: None Technical brewer of Steps: 4 Home Layout: One level Home Equipment: Civil engineer, contracting      Prior Function Level of Independence: Independent               Hand Dominance   Dominant Hand: Left    Extremity/Trunk Assessment   Upper Extremity Assessment: Defer to OT evaluation           Lower Extremity Assessment: RLE deficits/detail RLE Deficits / Details: Limited assessment due to bandaging. able to move does voluntarily, reports light touch sensation is returning.       Communication   Communication: No difficulties  Cognition Arousal/Alertness: Awake/alert Behavior During Therapy: WFL for tasks assessed/performed Overall Cognitive Status: Within Functional Limits for tasks assessed  General Comments General comments (skin integrity, edema, etc.): limited by dizziness.    Exercises General Exercises - Lower Extremity Long Arc Quad: Strengthening;Right;15 reps;Seated Hip Flexion/Marching: Strengthening;Right;5 reps;Seated      Assessment/Plan    PT Assessment Patient needs continued PT services  PT Diagnosis Difficulty walking;Acute pain   PT Problem List Decreased  strength;Decreased range of motion;Decreased activity tolerance;Decreased balance;Decreased mobility;Decreased knowledge of use of DME;Decreased knowledge of precautions;Impaired sensation;Pain;Obesity  PT Treatment Interventions DME instruction;Gait training;Functional mobility training;Therapeutic activities;Therapeutic exercise;Balance training;Neuromuscular re-education;Patient/family education;Modalities   PT Goals (Current goals can be found in the Care Plan section) Acute Rehab PT Goals Patient Stated Goal: Go to rehab before I go home PT Goal Formulation: With patient Time For Goal Achievement: 05/12/15 Potential to Achieve Goals: Good    Frequency Min 3X/week   Barriers to discharge Inaccessible home environment;Decreased caregiver support stairs to enter home, and lives alone    Co-evaluation               End of Session Equipment Utilized During Treatment: Gait belt Activity Tolerance: Treatment limited secondary to medical complications (Comment) (dizziness) Patient left: in chair;with call bell/phone within reach Nurse Communication: Mobility status         Time: 7989-2119 PT Time Calculation (min) (ACUTE ONLY): 20 min   Charges:   PT Evaluation $Initial PT Evaluation Tier I: 1 Procedure     PT G CodesEllouise Newer 04/28/2015, 10:30 AM [name], [place]

## 2015-04-29 NOTE — Clinical Social Work Placement (Signed)
   CLINICAL SOCIAL WORK PLACEMENT  NOTE  Date:  04/29/2015  Patient Details  Name: Claudia Cantrell MRN: [id number] Date of Birth: 1946/04/29  Clinical Social Work is seeking post-discharge placement for this patient at the [place] level of care (*CSW will initial, date and re-position this form in  chart as items are completed):  Yes   Patient/family provided with [place] Work Department's list of facilities offering this level of care within the geographic area requested by the patient (or if unable, by the patient's family).  Yes   Patient/family informed of their freedom to choose among providers that offer the needed level of care, that participate in Medicare, Medicaid or managed care program needed by the patient, have an available bed and are willing to accept the patient.  Yes   Patient/family informed of [facility]'s ownership interest in [facility] and [facility], as well as of the fact that they are under no obligation to receive care at these facilities.  PASRR submitted to EDS on 04/29/15     PASRR number received on 04/29/15     Existing PASRR number confirmed on       FL2 transmitted to all facilities in geographic area requested by pt/family on 04/29/15     FL2 transmitted to all facilities within larger geographic area on       Patient informed that his/her managed care company has contracts with or will negotiate with certain facilities, including the following:        Yes   Patient/family informed of bed offers received.  Patient chooses bed at [facility]     Physician recommends and patient chooses bed at      Patient to be transferred to [facility] on 04/29/15.  Patient to be transferred to facility by [facility]AR     Patient family notified on 04/29/15 of transfer.  Name of family member notified:  Tammy, patient's daughter     PHYSICIAN       Additional Comment:     _______________________________________________ Caroline Sauger, LCSW 04/29/2015, 12:59 PM [phone]

## 2015-04-29 NOTE — Progress Notes (Signed)
Pt prepared for d/c to SNF. IV d/c'd. Skin intact except as most recently charted. Vitals are stable. Report called to receiving facility. Pt to be transported by ambulance service. 

## 2015-04-29 NOTE — Discharge Summary (Signed)
Physician Discharge Summary      Patient ID: Claudia Cantrell MRN: [id number] DOB/AGE: January 12, 1946 69 y.o.  Admit date: 04/24/2015 Discharge date: 04/29/2015  Admission Diagnoses:  <principal problem not specified>  Discharge Diagnoses:  Active Problems:   Closed right ankle fracture   Ankle fracture, bimalleolar, closed   Past Medical History  Diagnosis Date  . Hyperlipidemia   . Arthritis     rheumatoid  . Myocardial infarction     92  . Anginal pain     occ  . Hypertension   . Shortness of breath     exersional  . Sleep apnea     cpap used 2 yrs  . GERD (gastroesophageal reflux disease)     occ  . Cancer   . Family history of anesthesia complication     daughter has difficulty waking     Surgeries: Procedure(s): OPEN REDUCTION INTERNAL FIXATION (ORIF) RIGHT BIMALLEOLAR ANKLE FRACTURE WITH SYNDESMOSIS FIXATION on 04/24/2015 - 04/27/2015   Consultants (if any):    Discharged Condition: Improved  Hospital Course: Claudia Cantrell is an 69 y.o. female who was admitted 04/24/2015 with a diagnosis of <principal problem not specified> and went to the operating room on 04/24/2015 - 04/27/2015 and underwent the above named procedures.    She was given perioperative antibiotics:  Anti-infectives    Start     Dose/Rate Route Frequency Ordered Stop   04/27/15 1645  ceFAZolin (ANCEF) IVPB 2 g/50 mL premix     2 g 100 mL/hr over 30 Minutes Intravenous Every 6 hours 04/27/15 1631 04/28/15 1044    .  She was given sequential compression devices, early ambulation, and aspirin for DVT prophylaxis.  She benefited maximally from the hospital stay and there were no complications.    Recent vital signs:  Filed Vitals:   04/29/15 1327  BP: 107/53  Pulse: 68  Temp: 98.9 F (37.2 C)  Resp: 18    Recent laboratory studies:  Lab Results  Component Value Date   HGB 12.4 04/28/2015   HGB 15.7* 04/24/2015   HGB 14.9 11/17/2013   Lab Results  Component Value Date   WBC 5.0  04/28/2015   PLT 214 04/28/2015   Lab Results  Component Value Date   INR 0.94 04/24/2015   Lab Results  Component Value Date   NA 139 04/28/2015   K 3.5 04/28/2015   CL 105 04/28/2015   CO2 27 04/28/2015   BUN 12 04/28/2015   CREATININE 0.85 04/28/2015   GLUCOSE 92 04/28/2015    Discharge Medications:     Medication List    STOP taking these medications        aspirin 81 MG tablet  Replaced by:  aspirin EC 325 MG tablet      TAKE these medications        aspirin EC 325 MG tablet  Take 1 tablet (325 mg total) by mouth 2 (two) times daily.     oxyCODONE 5 MG immediate release tablet  Commonly known as:  Oxy IR/ROXICODONE  Take 1-3 tablets (5-15 mg total) by mouth every 4 (four) hours as needed.      ASK your doctor about these medications        amLODipine 10 MG tablet  Commonly known as:  NORVASC  Take 10 mg by mouth daily.     multivitamin with minerals Tabs tablet  Take 1 tablet by mouth daily.     rosuvastatin 20 MG tablet  Commonly known as:  CRESTOR  Take 20 mg by mouth daily.     SYNTHROID 125 MCG tablet  Generic drug:  levothyroxine  TAKE ONE TABLET BY MOUTH ONCE DAILY     valsartan-hydrochlorothiazide 320-12.5 MG per tablet  Commonly known as:  DIOVAN-HCT  Take 1 tablet by mouth daily.        Diagnostic Studies: Dg Ankle Complete Right  04/27/2015   CLINICAL DATA:  ORIF right ankle  EXAM: DG C-ARM 61-120 MIN; RIGHT ANKLE - COMPLETE 3+ VIEW  COMPARISON:  None.  FLUOROSCOPY TIME:  Radiation Exposure Index (as provided by the fluoroscopic device): Not available  If the device does not provide the exposure index:  Fluoroscopy Time:  4.3 seconds  Number of Acquired Images:  5  FINDINGS: Fixation sideplate is noted along the distal fibula. Two fixation screws are noted traversing the medial malleolus. The inferior most fixation screw in the fibular sideplate extends into the tibia. The fracture fragments are in near anatomic alignment.  IMPRESSION:  ORIF right ankle   Electronically Signed   By: Inez Catalina M.D.   On: 04/27/2015 16:34   Dg Ankle Complete Right  04/24/2015   CLINICAL DATA:  Tripped over tree stump this afternoon with twisting injury and resulting pain and swelling right ankle.  EXAM: RIGHT ANKLE - COMPLETE 3+ VIEW  COMPARISON:  None.  FINDINGS: Moderate soft tissue swelling over the ankle. There is a displaced transverse fracture of the medial malleolus. Mild widening of the medial ankle mortise. There is a displaced oblique fracture of the distal fibular diaphysis 6 cm above the ankle mortise. Remainder the exam is within normal.  IMPRESSION: Displaced transverse fracture of the medial malleolus. Widening of the medial ankle mortise. Displaced oblique fracture of the distal fibular diaphysis.   Electronically Signed   By: Marin Olp M.D.   On: 04/24/2015 19:36   Dg Tibia/fibula Right Port  04/24/2015   CLINICAL DATA:  Fracture right lower leg.  EXAM: PORTABLE RIGHT TIBIA AND FIBULA - 2 VIEW  COMPARISON:  None.  FINDINGS: Examination demonstrates evidence of patient's minimally displaced oblique fracture through the distal fibular diaphysis as well as displaced transverse medial malleolar fracture. Evidence of patient's displaced posterior malleolar fracture as well as anterior subluxation of the distal tibia on the talus.  IMPRESSION: Evidence of patient's displaced medial malleolar, posterior malleolar and distal fibular diaphyseal fractures as well as anterior subluxation of the tibia on the talus.   Electronically Signed   By: Marin Olp M.D.   On: 04/24/2015 22:18   Dg C-arm 1-60 Min  04/27/2015   CLINICAL DATA:  ORIF right ankle  EXAM: DG C-ARM 61-120 MIN; RIGHT ANKLE - COMPLETE 3+ VIEW  COMPARISON:  None.  FLUOROSCOPY TIME:  Radiation Exposure Index (as provided by the fluoroscopic device): Not available  If the device does not provide the exposure index:  Fluoroscopy Time:  4.3 seconds  Number of Acquired Images:  5   FINDINGS: Fixation sideplate is noted along the distal fibula. Two fixation screws are noted traversing the medial malleolus. The inferior most fixation screw in the fibular sideplate extends into the tibia. The fracture fragments are in near anatomic alignment.  IMPRESSION: ORIF right ankle   Electronically Signed   By: Inez Catalina M.D.   On: 04/27/2015 16:34    Disposition: 01-Home or Self Care      Discharge Instructions    Non weight bearing    Complete by:  As directed  Follow-up Information    Follow up with Marianna Payment, MD In 2 weeks.   Specialty:  Orthopedic Surgery   Why:  For suture removal, For wound re-check   Contact information:   [address] [place] [address] [phone]        Signed: Marianna Payment 04/29/2015, 2:26 PM

## 2015-04-29 NOTE — Progress Notes (Signed)
   Subjective:  Patient reports pain as mild.    Objective:   VITALS:   Filed Vitals:   04/28/15 0628 04/28/15 1400 04/28/15 2045 04/29/15 0517  BP: 123/55 118/65 131/50 102/47  Pulse: 75 74 72 62  Temp: 97.7 F (36.5 C) 98.1 F (36.7 C) 99.3 F (37.4 C) 99.1 F (37.3 C)  TempSrc: Oral Oral Oral Oral  Resp:  16 17 18   Height:      Weight:      SpO2: 96% 96% 90% 94%    Neurologically intact Neurovascular intact Sensation intact distally Intact pulses distally Dorsiflexion/Plantar flexion intact Incision: dressing C/D/I and no drainage No cellulitis present Compartment soft   Lab Results  Component Value Date   WBC 5.0 04/28/2015   HGB 12.4 04/28/2015   HCT 38.7 04/28/2015   MCV 90.2 04/28/2015   PLT 214 04/28/2015     Assessment/Plan:  2 Days Post-Op   - dc to [facility] today - patient stable from ortho standpoint - Rx in chart - FL2 signed  Marianna Payment 04/29/2015, 11:26 AM [phone]

## 2015-04-29 NOTE — Care Management Note (Signed)
Case Management Note  Patient Details  Name: BALINDA HEACOCK MRN: [id number] Date of Birth: 1946-01-30  Subjective/Objective:      69 yr old female admitted with a closed right ankle fracture, patient underwent a right ankle ORIF.              Action/Plan:  Patient will need shortterm rehab at [facility]. Social worker is aware. Patient will go to [facility].   Expected Discharge Date:  04/28/15               Expected Discharge Plan:  Skilled Nursing Facility  In-House Referral:  Clinical Social Work  Discharge planning Services  CM Consult  Post Acute Care Choice:  NA Choice offered to:  NA  DME Arranged:    DME Agency:     HH Arranged:  NA HH Agency:     Status of Service:  Completed, signed off  Medicare Important Message Given:    Date Medicare IM Given:    Medicare IM give by:    Date Additional Medicare IM Given:    Additional Medicare Important Message give by:     If discussed at [facility] of Stay Meetings, dates discussed:    Additional Comments:  Ninfa Meeker, RN 04/29/2015, 11:14 AM

## 2015-04-29 NOTE — Progress Notes (Signed)
Physical Therapy Treatment Patient Details Name: GLENORA MOROCHO MRN: [id number] DOB: Jun 24, 1946 Today's Date: 04/29/2015    History of Present Illness 69 y.o. female s/p OPEN REDUCTION INTERNAL FIXATION (ORIF) RIGHT BIMALLEOLAR ANKLE FRACTURE WITH SYNDESMOSIS FIXATION     PT Comments    Patient progressing well towards PT goals. Tolerated gait training today with Min A for balance. LOB onto bed initially upon standing. Fatigues quickly. Reviewed HEP. Continues to be appropriate for [facility] SNF to maximize independence as pt high falls risk.   Follow Up Recommendations  SNF;Supervision for mobility/OOB     Equipment Recommendations  None recommended by PT    Recommendations for Other Services       Precautions / Restrictions Precautions Precautions: Fall Restrictions Weight Bearing Restrictions: Yes RLE Weight Bearing: Non weight bearing    Mobility  Bed Mobility Overal bed mobility: Modified Independent                Transfers Overall transfer level: Needs assistance Equipment used: Rolling walker (2 wheeled) Transfers: Sit to/from Stand Sit to Stand: Min guard         General transfer comment: Min guard for safety with sit<>stand from lowest bed setting.  Able to maintain NWB on RLE. LOB onto bed prior to ambulating. Stood from [employer], from chair x1. Transferred to chair uncontrolled descent.  Ambulation/Gait Ambulation/Gait assistance: Min assist Ambulation Distance (Feet): 10 Feet (x2 bouts) Assistive device: Rolling walker (2 wheeled) Gait Pattern/deviations: Step-to pattern;Trunk flexed     General Gait Details: "hop to" gait. Compliant with NWB RLE. Cues to decrease gait speed and increase hop distance for energy conservation.   Stairs            Wheelchair Mobility    Modified Rankin (Stroke Patients Only)       Balance Overall balance assessment: Needs assistance Sitting-balance support: Feet supported;No upper extremity  supported Sitting balance-Leahy Scale: Good     Standing balance support: During functional activity Standing balance-Leahy Scale: Poor Standing balance comment: Relient on RW.                     Cognition Arousal/Alertness: Awake/alert Behavior During Therapy: WFL for tasks assessed/performed Overall Cognitive Status: Within Functional Limits for tasks assessed                      Exercises      General Comments        Pertinent Vitals/Pain Pain Assessment: No/denies pain    Home Living                      Prior Function            PT Goals (current goals can now be found in the care plan section) Progress towards PT goals: Progressing toward goals    Frequency  Min 3X/week    PT Plan Current plan remains appropriate    Co-evaluation             End of Session Equipment Utilized During Treatment: Gait belt Activity Tolerance: Patient tolerated treatment well Patient left: in chair;with call bell/phone within reach (Instructed pt not to get up without assist. "I can though")     Time: 0076-2263 PT Time Calculation (min) (ACUTE ONLY): 24 min  Charges:  $Gait Training: 8-22 mins $Therapeutic Activity: 8-22 mins  G Codes:      [place] 04/29/2015, 12:28 PM  Wray Kearns, [place], DPT [phone]

## 2015-04-29 NOTE — Discharge Planning (Signed)
Patient to be discharged to [facility]. Patient and patient's daughter updated regarding discharge.  [employer] authorization: [id number]  Facility: [facility] RN report number: [phone] Transportation: EMS ([address]  Lubertha Sayres, [place] [phone]) and Surgical [phone])

## 2015-05-02 ENCOUNTER — Encounter (HOSPITAL_COMMUNITY): Payer: Self-pay | Admitting: Orthopaedic Surgery

## 2015-05-02 ENCOUNTER — Non-Acute Institutional Stay (SKILLED_NURSING_FACILITY): Payer: PPO | Admitting: Adult Health

## 2015-05-02 DIAGNOSIS — I10 Essential (primary) hypertension: Secondary | ICD-10-CM | POA: Diagnosis not present

## 2015-05-02 DIAGNOSIS — E039 Hypothyroidism, unspecified: Secondary | ICD-10-CM | POA: Diagnosis not present

## 2015-05-02 DIAGNOSIS — M1000 Idiopathic gout, unspecified site: Secondary | ICD-10-CM | POA: Diagnosis not present

## 2015-05-02 DIAGNOSIS — K5900 Constipation, unspecified: Secondary | ICD-10-CM | POA: Diagnosis not present

## 2015-05-02 DIAGNOSIS — M109 Gout, unspecified: Secondary | ICD-10-CM

## 2015-05-02 DIAGNOSIS — S82891S Other fracture of right lower leg, sequela: Secondary | ICD-10-CM

## 2015-05-02 DIAGNOSIS — E785 Hyperlipidemia, unspecified: Secondary | ICD-10-CM

## 2015-05-02 NOTE — Progress Notes (Signed)
Patient ID: Claudia Cantrell, female   DOB: 1946-04-30, 69 y.o.   MRN: [id number]   05/02/2015  Facility:  [facility]ursing Home Location:  [place] Room Number: [phone] LEVEL OF CARE:  SNF (31)   Chief Complaint  Patient presents with  . Hospitalization Follow-up    Closed right ankle fracture S/P ORIF, hypertension, hyperlipidemia, hypothyroidism, constipation and gout    HISTORY OF PRESENT ILLNESS:  This is a 69 year old female who has been admitted to [facility] on 04/29/15 from [facility]. She has PMH of hyperlipidemia, arthritis, hypertension, sleep apnea and GERD. She had a fall at home and sustained a closed right ankle fracture and had ORIF on 5/18.  She complains of left big toe tenderness and is requesting for Colchicine. She is also complaining of constipation  and is already taking senna S2 and MiraLAX. She is alert and oriented. She reports her pain is well controlled.  She has been admitted for a short-term rehabilitation.  PAST MEDICAL HISTORY:  Past Medical History  Diagnosis Date  . Hyperlipidemia   . Arthritis     rheumatoid  . Myocardial infarction     89  . Anginal pain     occ  . Hypertension   . Shortness of breath     exersional  . Sleep apnea     cpap used 2 yrs  . GERD (gastroesophageal reflux disease)     occ  . Cancer   . Family history of anesthesia complication     daughter has difficulty waking     CURRENT MEDICATIONS: Reviewed per MAR/see medication list  Allergies  Allergen Reactions  . Codeine Anxiety     REVIEW OF SYSTEMS:  GENERAL: no change in appetite, no fatigue, no weight changes, no fever, chills or weakness RESPIRATORY: no cough, SOB, DOE, wheezing, hemoptysis CARDIAC: no chest pain, edema or palpitations GI: no abdominal pain, diarrhea,  heart burn, nausea or vomiting, +constipation  PHYSICAL EXAMINATION  GENERAL: no acute distress, obese EYES: conjunctivae normal, sclerae normal,  normal eye lids NECK: supple, trachea midline, no neck masses, no thyroid tenderness, no thyromegaly LYMPHATICS: no LAN in the neck, no supraclavicular LAN RESPIRATORY: breathing is even & unlabored, BS CTAB CARDIAC: RRR, no murmur,no extra heart sounds, no edema GI: abdomen soft, normal BS, no masses, no tenderness, no hepatomegaly, no splenomegaly EXTREMITIES:  Right foot has short leg splint and wrapped with ACE bandage; able to wiggle right foot toes; left big toe is tender PSYCHIATRIC: the patient is alert & oriented to person, affect & behavior appropriate  LABS/RADIOLOGY: Labs reviewed: Basic Metabolic Panel:  Recent Labs  04/24/15 1956 04/28/15 0615  NA 139 139  K 3.5 3.5  CL 103 105  CO2 25 27  GLUCOSE 108* 92  BUN 13 12  CREATININE 0.88 0.85  CALCIUM 9.5 8.2*   Liver Function Tests:  Recent Labs  04/24/15 1956  AST 28  ALT 24  ALKPHOS 98  BILITOT 0.6  PROT 8.2*  ALBUMIN 4.6   CBC:  Recent Labs  04/24/15 1956 04/28/15 0615  WBC 5.5 5.0  NEUTROABS 3.9 3.1  HGB 15.7* 12.4  HCT 47.5* 38.7  MCV 89.6 90.2  PLT 243 214    Dg Ankle Complete Right  04/27/2015   CLINICAL DATA:  ORIF right ankle  EXAM: DG C-ARM 61-120 MIN; RIGHT ANKLE - COMPLETE 3+ VIEW  COMPARISON:  None.  FLUOROSCOPY TIME:  Radiation Exposure Index (as provided  by the fluoroscopic device): Not available  If the device does not provide the exposure index:  Fluoroscopy Time:  4.3 seconds  Number of Acquired Images:  5  FINDINGS: Fixation sideplate is noted along the distal fibula. Two fixation screws are noted traversing the medial malleolus. The inferior most fixation screw in the fibular sideplate extends into the tibia. The fracture fragments are in near anatomic alignment.  IMPRESSION: ORIF right ankle   Electronically Signed   By: Inez Catalina M.D.   On: 04/27/2015 16:34   Dg Ankle Complete Right  04/24/2015   CLINICAL DATA:  Tripped over tree stump this afternoon with twisting injury and  resulting pain and swelling right ankle.  EXAM: RIGHT ANKLE - COMPLETE 3+ VIEW  COMPARISON:  None.  FINDINGS: Moderate soft tissue swelling over the ankle. There is a displaced transverse fracture of the medial malleolus. Mild widening of the medial ankle mortise. There is a displaced oblique fracture of the distal fibular diaphysis 6 cm above the ankle mortise. Remainder the exam is within normal.  IMPRESSION: Displaced transverse fracture of the medial malleolus. Widening of the medial ankle mortise. Displaced oblique fracture of the distal fibular diaphysis.   Electronically Signed   By: Marin Olp M.D.   On: 04/24/2015 19:36   Dg Tibia/fibula Right Port  04/24/2015   CLINICAL DATA:  Fracture right lower leg.  EXAM: PORTABLE RIGHT TIBIA AND FIBULA - 2 VIEW  COMPARISON:  None.  FINDINGS: Examination demonstrates evidence of patient's minimally displaced oblique fracture through the distal fibular diaphysis as well as displaced transverse medial malleolar fracture. Evidence of patient's displaced posterior malleolar fracture as well as anterior subluxation of the distal tibia on the talus.  IMPRESSION: Evidence of patient's displaced medial malleolar, posterior malleolar and distal fibular diaphyseal fractures as well as anterior subluxation of the tibia on the talus.   Electronically Signed   By: Marin Olp M.D.   On: 04/24/2015 22:18   Dg C-arm 1-60 Min  04/27/2015   CLINICAL DATA:  ORIF right ankle  EXAM: DG C-ARM 61-120 MIN; RIGHT ANKLE - COMPLETE 3+ VIEW  COMPARISON:  None.  FLUOROSCOPY TIME:  Radiation Exposure Index (as provided by the fluoroscopic device): Not available  If the device does not provide the exposure index:  Fluoroscopy Time:  4.3 seconds  Number of Acquired Images:  5  FINDINGS: Fixation sideplate is noted along the distal fibula. Two fixation screws are noted traversing the medial malleolus. The inferior most fixation screw in the fibular sideplate extends into the tibia. The  fracture fragments are in near anatomic alignment.  IMPRESSION: ORIF right ankle   Electronically Signed   By: Inez Catalina M.D.   On: 04/27/2015 16:34    ASSESSMENT/PLAN:  Closed right ankle fracture S/P ORIF - for rehabilitation; follow-up with Dr. Erlinda Hong, orthopedic surgeon, in 2 weeks; continue aspirin 325 mg 1 tab by mouth twice a day for DVT prophylaxis; and oxycodone 5 mg 1-3 tabs by mouth every 4 hours when necessary for pain Gout - start: Colchicine 0.6 mg 1 PO BID and Allopurinol 100 mg by mouth daily Hypertension - continue amlodipine 10 mg by mouth daily and Diovan HCT 320-12 0.5 mg 1 tab by mouth daily Hyperlipidemia - continue Crestor 20 mg 1 tab by mouth daily Hypothyroidism  - TSH 0.57; continue Synthroid 125 g 1 tab by mouth daily  Constipation - continue senna S2 tabs by mouth twice a day and MiraLAX 17 g by mouth twice  a day; give Magcitrate 296 mL by mouth 1   Goals of care:  Short-term rehabilitation   Labs/test ordered:  CBC, BMP and uric acid level   Spent 50 minutes in patient care.    [facility], NP [employer] [phone]

## 2015-05-03 ENCOUNTER — Non-Acute Institutional Stay: Payer: PPO | Admitting: Internal Medicine

## 2015-05-03 DIAGNOSIS — S82891S Other fracture of right lower leg, sequela: Secondary | ICD-10-CM

## 2015-05-03 DIAGNOSIS — I10 Essential (primary) hypertension: Secondary | ICD-10-CM | POA: Diagnosis not present

## 2015-05-03 DIAGNOSIS — E785 Hyperlipidemia, unspecified: Secondary | ICD-10-CM | POA: Diagnosis not present

## 2015-05-03 DIAGNOSIS — M109 Gout, unspecified: Secondary | ICD-10-CM

## 2015-05-03 DIAGNOSIS — M10072 Idiopathic gout, left ankle and foot: Secondary | ICD-10-CM | POA: Diagnosis not present

## 2015-05-03 DIAGNOSIS — E890 Postprocedural hypothyroidism: Secondary | ICD-10-CM | POA: Diagnosis not present

## 2015-05-03 DIAGNOSIS — K5901 Slow transit constipation: Secondary | ICD-10-CM

## 2015-05-03 NOTE — Progress Notes (Signed)
Patient ID: Claudia Cantrell, female   DOB: Jul 14, 1946, 69 y.o.   MRN: [id number]     [facility]   PCP: Patricia Nettle, MD  Code Status: full code  Allergies  Allergen Reactions  . Codeine Anxiety    Chief Complaint  Patient presents with  . New Admit To SNF     HPI:  69 year old patient is here for short term rehabilitation post hospital admission from 04/24/15-04/29/15 with closed right ankle fracture post fall. She underwent ORIF. She is seen in her room today. Her pain is under control. She had a bowel movement last night after a week and now feels much better overall. Denies any concerns. She was having pain in her left big toe and has been started on colchicine. She has pmh of HTN, OA, GERD, sleep apnea, HLD among others.   Review of Systems:  Constitutional: Negative for fever, chills, diaphoresis.  HENT: Negative for headache, congestion, nasal discharge Eyes: Negative for eye pain, blurred vision, double vision and discharge.  Respiratory: Negative for cough, shortness of breath and wheezing.   Cardiovascular: Negative for chest pain, palpitations, leg swelling.  Gastrointestinal: Negative for heartburn, nausea, vomiting, abdominal pain. Appetite is good. BM yesterday Genitourinary: Negative for dysuria Musculoskeletal: Negative for back pain, falls Skin: Negative for itching, rash.  Neurological: Negative for dizziness, tingling, focal weakness Psychiatric/Behavioral: Negative for depression   Past Medical History  Diagnosis Date  . Hyperlipidemia   . Arthritis     rheumatoid  . Myocardial infarction     69  . Anginal pain     occ  . Hypertension   . Shortness of breath     exersional  . Sleep apnea     cpap used 2 yrs  . GERD (gastroesophageal reflux disease)     occ  . Cancer   . Family history of anesthesia complication     daughter has difficulty waking    Past Surgical History  Procedure Laterality Date  .  Abdominal hysterectomy  1988  . Back surgery  1994  . Thyroidectomy  11/24/2013    DR Dalbert Batman  . Thyroidectomy N/A 11/24/2013    Procedure: TOTAL THYROIDECTOMY;  Surgeon: Adin Hector, MD;  Location: [facility];  Service: General;  Laterality: N/A;  . Orif ankle fracture Right 04/27/2015    Procedure: OPEN REDUCTION INTERNAL FIXATION (ORIF) RIGHT BIMALLEOLAR ANKLE FRACTURE WITH SYNDESMOSIS FIXATION;  Surgeon: Leandrew Koyanagi, MD;  Location: [place] [facility];  Service: Orthopedics;  Laterality: Right;   Social History:   reports that she quit smoking about 24 years ago. Her smoking use included Cigarettes. She has a 15 pack-year smoking history. She has never used smokeless tobacco. She reports that she drinks alcohol. She reports that she does not use illicit drugs.  Family History  Problem Relation Age of Onset  . Heart disease Mother   . Diabetes Father     Medications: Patient's Medications  New Prescriptions   No medications on file  Previous Medications   ALLOPURINOL (ZYLOPRIM) 100 MG TABLET    Take 100 mg by mouth daily.   AMLODIPINE (NORVASC) 10 MG TABLET    Take 10 mg by mouth daily.    ASPIRIN EC 325 MG TABLET    Take 1 tablet (325 mg total) by mouth 2 (two) times daily.   COLCHICINE 0.6 MG TABLET    Take 0.6 mg by mouth 2 (two) times daily.   MULTIPLE VITAMIN (MULTIVITAMIN WITH  MINERALS) TABS    Take 1 tablet by mouth daily.   OXYCODONE (OXY IR/ROXICODONE) 5 MG IMMEDIATE RELEASE TABLET    Take 1-3 tablets (5-15 mg total) by mouth every 4 (four) hours as needed.   POLYETHYLENE GLYCOL (MIRALAX / GLYCOLAX) PACKET    Take 17 g by mouth 2 (two) times daily.   ROSUVASTATIN (CRESTOR) 20 MG TABLET    Take 20 mg by mouth daily.   SENNA-DOCUSATE (SENOKOT-S) 8.6-50 MG PER TABLET    Take 2 tablets by mouth 2 (two) times daily.   SYNTHROID 125 MCG TABLET    TAKE ONE TABLET BY MOUTH ONCE DAILY   VALSARTAN-HYDROCHLOROTHIAZIDE (DIOVAN-HCT) 320-12.5 MG PER TABLET    Take 1 tablet by mouth daily.  Modified  Medications   No medications on file  Discontinued Medications   No medications on file     Physical Exam: Filed Vitals:   05/03/15 0802  BP: 122/69  Pulse: 60  Temp: 98.4 F (36.9 C)  Resp: 18  SpO2: 95%    General- elderly female, obese, in no acute distress Head- normocephalic, atraumatic Throat- moist mucus membrane Eyes- PERRLA, EOMI, no pallor, no icterus, no discharge, normal conjunctiva, normal sclera Neck- no cervical lymphadenopathy Cardiovascular- normal s1,s2, no murmurs, palpable dorsalis pedis in left leg, no leg edema Respiratory- bilateral clear to auscultation, no wheeze, no rhonchi, no crackles, no use of accessory muscles Abdomen- bowel sounds present, soft, non tender Musculoskeletal- able to move all 4 extremities, right foot/leg with leg splint and ace wrap, can move her toes and has good capillary refill. Left toe minimal tenderness, no swelling Neurological- no focal deficit Skin- warm and dry Psychiatry- alert and oriented to person, place and time, normal mood and affect    Labs reviewed: Basic Metabolic Panel:  Recent Labs  04/24/15 1956 04/28/15 0615  NA 139 139  K 3.5 3.5  CL 103 105  CO2 25 27  GLUCOSE 108* 92  BUN 13 12  CREATININE 0.88 0.85  CALCIUM 9.5 8.2*   Liver Function Tests:  Recent Labs  04/24/15 1956  AST 28  ALT 24  ALKPHOS 98  BILITOT 0.6  PROT 8.2*  ALBUMIN 4.6   No results for input(s): LIPASE, AMYLASE in the last 8760 hours. No results for input(s): AMMONIA in the last 8760 hours. CBC:  Recent Labs  04/24/15 1956 04/28/15 0615  WBC 5.5 5.0  NEUTROABS 3.9 3.1  HGB 15.7* 12.4  HCT 47.5* 38.7  MCV 89.6 90.2  PLT 243 214    Assessment/Plan  Closed right ankle fracture  S/P ORIF. Has f/u with orthopedics. Continue oxycodone 5 mg 1-3 tab q4h prn pain and aspirin 325 mg bid for dvt prophylaxis. NWB in right foot. Will have patient work with PT/OT as tolerated to regain strength and restore function.   Fall precautions are in place.  Constipation Stable now continue senna s 2 tab bid with miralax bid and monitor. Hydration encouraged  Acute gout Tolerating colchicine well, continue this  Hypertension Stable.continue amlodipine 10 mg daily and Diovan HCT 320-12 0.5 mg daily  Hypothyroidism Continue syntrhoid home regimen 125 mcg daily  Hyperlipidemia continue Crestor 20 mg daily   Goals of care:  Short-term rehabilitation   Labs/test ordered:  CBC, BMP and uric acid level   Family/ staff Communication: reviewed care plan with patient and nursing supervisor    Blanchie Serve, MD  [facility] [phone] (Monday-Friday 8 am - 5 pm) [phone] (afterhours)

## 2015-05-12 ENCOUNTER — Encounter (HOSPITAL_COMMUNITY): Payer: Self-pay | Admitting: Orthopaedic Surgery

## 2015-05-20 ENCOUNTER — Encounter: Payer: Self-pay | Admitting: Adult Health

## 2015-05-20 ENCOUNTER — Non-Acute Institutional Stay (SKILLED_NURSING_FACILITY): Payer: PPO | Admitting: Adult Health

## 2015-05-20 DIAGNOSIS — I10 Essential (primary) hypertension: Secondary | ICD-10-CM | POA: Diagnosis not present

## 2015-05-20 DIAGNOSIS — K5900 Constipation, unspecified: Secondary | ICD-10-CM | POA: Diagnosis not present

## 2015-05-20 DIAGNOSIS — S82891S Other fracture of right lower leg, sequela: Secondary | ICD-10-CM | POA: Diagnosis not present

## 2015-05-20 DIAGNOSIS — M109 Gout, unspecified: Secondary | ICD-10-CM

## 2015-05-20 DIAGNOSIS — E785 Hyperlipidemia, unspecified: Secondary | ICD-10-CM | POA: Diagnosis not present

## 2015-05-20 DIAGNOSIS — M1000 Idiopathic gout, unspecified site: Secondary | ICD-10-CM

## 2015-05-20 DIAGNOSIS — E039 Hypothyroidism, unspecified: Secondary | ICD-10-CM | POA: Diagnosis not present

## 2015-05-20 NOTE — Progress Notes (Signed)
Patient ID: Claudia Cantrell, female   DOB: [birth year], 69 y.o.   MRN: [id number]   05/20/2015  Facility:  Nursing Home Location:  [place] Room Number: [id number] LEVEL OF CARE:  SNF (31)   Chief Complaint  Patient presents with  . Discharge Note    Closed right ankle fracture S/P ORIF, hypertension, hyperlipidemia, hypothyroidism, constipation and gout    HISTORY OF PRESENT ILLNESS:  This is a 69 year old female who is for discharge home with Home health PT and OT. DME:  18" X 16" wheelchair with elevating leg rests, cushion. She has been admitted to [facility] on 04/29/15 from [facility]. She has PMH of hyperlipidemia, arthritis, hypertension, sleep apnea and GERD. She had a fall at home and sustained a closed right ankle fracture and had ORIF on 5/18.  Patient was admitted to this facility for short-term rehabilitation after the patient's recent hospitalization.  Patient has completed SNF rehabilitation and therapy has cleared the patient for discharge.   PAST MEDICAL HISTORY:  Past Medical History  Diagnosis Date  . Hyperlipidemia   . Arthritis     rheumatoid  . Myocardial infarction     89  . Anginal pain     occ  . Hypertension   . Shortness of breath     exersional  . Sleep apnea     cpap used 2 yrs  . GERD (gastroesophageal reflux disease)     occ  . Cancer   . Family history of anesthesia complication     daughter has difficulty waking     CURRENT MEDICATIONS: Reviewed per MAR/see medication list  Allergies  Allergen Reactions  . Codeine Anxiety     REVIEW OF SYSTEMS:  GENERAL: no change in appetite, no fatigue, no weight changes, no fever, chills or weakness RESPIRATORY: no cough, SOB, DOE, wheezing, hemoptysis CARDIAC: no chest pain, edema or palpitations GI: no abdominal pain, diarrhea,  heart burn, nausea or vomiting  PHYSICAL EXAMINATION  GENERAL: no acute distress, obese NECK: supple, trachea midline, no  neck masses, no thyroid tenderness, no thyromegaly LYMPHATICS: no LAN in the neck, no supraclavicular LAN RESPIRATORY: breathing is even & unlabored, BS CTAB CARDIAC: RRR, no murmur,no extra heart sounds, no edema GI: abdomen soft, normal BS, no masses, no tenderness, no hepatomegaly, no splenomegaly EXTREMITIES:  Right foot has cam boot, NWB on RLE PSYCHIATRIC: the patient is alert & oriented to person, affect & behavior appropriate  LABS/RADIOLOGY: Labs reviewed: 05/03/15  6  WBC 5.1 hemoglobin 13.4 hematocrit 39.6 MCV 86.7 platelet 303 sodium 140 potassium 4.2 glucose 77 BUN 15 creatinine 0.82 calcium 9.3 uric acid 6.6 Basic Metabolic Panel:  Recent Labs  04/24/15 1956 04/28/15 0615  NA 139 139  K 3.5 3.5  CL 103 105  CO2 25 27  GLUCOSE 108* 92  BUN 13 12  CREATININE 0.88 0.85  CALCIUM 9.5 8.2*   Liver Function Tests:  Recent Labs  04/24/15 1956  AST 28  ALT 24  ALKPHOS 98  BILITOT 0.6  PROT 8.2*  ALBUMIN 4.6   CBC:  Recent Labs  04/24/15 1956 04/28/15 0615  WBC 5.5 5.0  NEUTROABS 3.9 3.1  HGB 15.7* 12.4  HCT 47.5* 38.7  MCV 89.6 90.2  PLT 243 214    Dg Ankle Complete Right  04/27/2015   CLINICAL DATA:  ORIF right ankle  EXAM: DG C-ARM 61-120 MIN; RIGHT ANKLE - COMPLETE 3+ VIEW  COMPARISON:  None.  FLUOROSCOPY TIME:  Radiation Exposure Index (as provided by the fluoroscopic device): Not available  If the device does not provide the exposure index:  Fluoroscopy Time:  4.3 seconds  Number of Acquired Images:  5  FINDINGS: Fixation sideplate is noted along the distal fibula. Two fixation screws are noted traversing the medial malleolus. The inferior most fixation screw in the fibular sideplate extends into the tibia. The fracture fragments are in near anatomic alignment.  IMPRESSION: ORIF right ankle   Electronically Signed   By: Inez Catalina M.D.   On: 04/27/2015 16:34   Dg Ankle Complete Right  04/24/2015   CLINICAL DATA:  Tripped over tree stump this  afternoon with twisting injury and resulting pain and swelling right ankle.  EXAM: RIGHT ANKLE - COMPLETE 3+ VIEW  COMPARISON:  None.  FINDINGS: Moderate soft tissue swelling over the ankle. There is a displaced transverse fracture of the medial malleolus. Mild widening of the medial ankle mortise. There is a displaced oblique fracture of the distal fibular diaphysis 6 cm above the ankle mortise. Remainder the exam is within normal.  IMPRESSION: Displaced transverse fracture of the medial malleolus. Widening of the medial ankle mortise. Displaced oblique fracture of the distal fibular diaphysis.   Electronically Signed   By: Marin Olp M.D.   On: 04/24/2015 19:36   Dg Tibia/fibula Right Port  04/24/2015   CLINICAL DATA:  Fracture right lower leg.  EXAM: PORTABLE RIGHT TIBIA AND FIBULA - 2 VIEW  COMPARISON:  None.  FINDINGS: Examination demonstrates evidence of patient's minimally displaced oblique fracture through the distal fibular diaphysis as well as displaced transverse medial malleolar fracture. Evidence of patient's displaced posterior malleolar fracture as well as anterior subluxation of the distal tibia on the talus.  IMPRESSION: Evidence of patient's displaced medial malleolar, posterior malleolar and distal fibular diaphyseal fractures as well as anterior subluxation of the tibia on the talus.   Electronically Signed   By: Marin Olp M.D.   On: 04/24/2015 22:18   Dg C-arm 1-60 Min  04/27/2015   CLINICAL DATA:  ORIF right ankle  EXAM: DG C-ARM 61-120 MIN; RIGHT ANKLE - COMPLETE 3+ VIEW  COMPARISON:  None.  FLUOROSCOPY TIME:  Radiation Exposure Index (as provided by the fluoroscopic device): Not available  If the device does not provide the exposure index:  Fluoroscopy Time:  4.3 seconds  Number of Acquired Images:  5  FINDINGS: Fixation sideplate is noted along the distal fibula. Two fixation screws are noted traversing the medial malleolus. The inferior most fixation screw in the fibular  sideplate extends into the tibia. The fracture fragments are in near anatomic alignment.  IMPRESSION: ORIF right ankle   Electronically Signed   By: Inez Catalina M.D.   On: 04/27/2015 16:34    ASSESSMENT/PLAN:  Closed right ankle fracture S/P ORIF - for Home health PT and OT; follow-up with Dr. Erlinda Hong, orthopedic surgeon  In 5 weeks; continue aspirin 325 mg 1 tab by mouth twice a day for DVT prophylaxis; and oxycodone 5 mg 1-3 tabs by mouth every 4 hours when necessary for pain Gout - continue Colchicine 0.6 mg 1 PO BID and Allopurinol 100 mg by mouth daily Hypertension - continue amlodipine 10 mg by mouth daily and Diovan HCT 320-12.5 mg 1 tab by mouth daily Hyperlipidemia - continue Lipitor 80 mg 1 tab by mouth daily Hypothyroidism  - TSH 0.57; continue Synthroid 125 g 1 tab by mouth daily  Constipation - continue senna S2 tabs  by mouth twice a day and MiraLAX 17 g by mouth twice a day    I have filled out patient's discharge paperwork and written prescriptions.  Patient will receive home health PT and OT.  DME provided:  18" X 16" wheelchair with elevating leg rests, cushion  Total discharge time: Greater than 30 minutes  Discharge time involved coordination of the discharge process with social worker, nursing staff and therapy department. Medical justification for home health services/DME verified.     [facility], NP [employer] [phone]

## 2015-06-01 ENCOUNTER — Encounter: Payer: Self-pay | Admitting: *Deleted

## 2015-06-07 ENCOUNTER — Encounter: Payer: Self-pay | Admitting: Cardiovascular Disease

## 2015-07-24 ENCOUNTER — Emergency Department (INDEPENDENT_AMBULATORY_CARE_PROVIDER_SITE_OTHER)
Admission: EM | Admit: 2015-07-24 | Discharge: 2015-07-24 | Disposition: A | Discharge status: Home / Self Care | Payer: PPO | Source: Home / Self Care | Attending: Family Medicine | Admitting: Family Medicine

## 2015-07-24 DIAGNOSIS — Z91038 Other insect allergy status: Secondary | ICD-10-CM

## 2015-07-24 DIAGNOSIS — Z91030 Bee allergy status: Secondary | ICD-10-CM

## 2015-07-24 DIAGNOSIS — L03119 Cellulitis of unspecified part of limb: Secondary | ICD-10-CM

## 2015-07-24 MED ORDER — CEPHALEXIN 500 MG PO CAPS: 500.0000 mg | ORAL_CAPSULE | Freq: Two times a day (BID) | ORAL | Status: AC

## 2015-07-24 MED ORDER — METHYLPREDNISOLONE ACETATE 80 MG/ML IJ SUSP
80.0000 mg | Freq: Once | INTRAMUSCULAR | Status: AC
  Administered 2015-07-24: 80 mg via INTRAMUSCULAR

## 2015-07-24 MED ORDER — DIPHENHYDRAMINE HCL 25 MG PO CAPS
ORAL_CAPSULE | ORAL | Status: AC
  Filled 2015-07-24: qty 1

## 2015-07-24 MED ORDER — METHYLPREDNISOLONE ACETATE 80 MG/ML IJ SUSP
INTRAMUSCULAR | Status: AC
  Filled 2015-07-24: qty 1

## 2015-07-24 MED ORDER — DIPHENHYDRAMINE HCL 25 MG PO CAPS
25.0000 mg | ORAL_CAPSULE | Freq: Once | ORAL | Status: AC
  Administered 2015-07-24: 25 mg via ORAL

## 2015-07-24 NOTE — ED Provider Notes (Signed)
CSN: [id number]     Arrival date & time 07/24/15  1442 History   First MD Initiated Contact with Patient 07/24/15 1503     No chief complaint on file.  (Consider location/radiation/quality/duration/timing/severity/associated sxs/prior Treatment) HPI Comments: Claudia Cantrell presents with pain and swelling of her right upper thigh following a wasp sting last pm. She reports being allergic; but no anaphylactic reaction is documented. She notes that the redness in her leg has "spread" into the upper leg and is very hot. No fever or chills. No N, V. No headaches. No SOB. Has not taken anything.   The history is provided by the patient.    Past Medical History  Diagnosis Date  . Hyperlipidemia   . Arthritis     rheumatoid  . Myocardial infarction     92  . Anginal pain     occ  . Hypertension   . Shortness of breath     exersional  . Sleep apnea     cpap used 2 yrs  . GERD (gastroesophageal reflux disease)     occ  . Cancer   . Family history of anesthesia complication     daughter has difficulty waking    Past Surgical History  Procedure Laterality Date  . Abdominal hysterectomy  1988  . Back surgery  1994  . Thyroidectomy  11/24/2013    DR Dalbert Batman  . Thyroidectomy N/A 11/24/2013    Procedure: TOTAL THYROIDECTOMY;  Surgeon: Adin Hector, MD;  Location: [facility];  Service: General;  Laterality: N/A;  . Orif ankle fracture Right 04/27/2015    Procedure: OPEN REDUCTION INTERNAL FIXATION (ORIF) RIGHT BIMALLEOLAR ANKLE FRACTURE WITH SYNDESMOSIS FIXATION;  Surgeon: Leandrew Koyanagi, MD;  Location: [facility];  Service: Orthopedics;  Laterality: Right;  . Cardiac catheterization  [place]    PTCA BY DR Lamount Cohen  . Transthoracic echocardiogram  01/15/2013    EF 55% TO 65%. PROBABLE MILD HYPOKINESIS OF THE INFERIOR MYOCARDIUM. GRADE 1 DIASTOLIC DYSFUNCTION. TRIAL AR.LA IS MILDLY DILATED.   Family History  Problem Relation Age of Onset  . Heart disease Mother   . Sudden death Mother    . Diabetes Father   . Stroke Father   . Bone cancer Sister   . Sudden death Brother   . Melanoma Brother   . Heart disease Brother    Social History  Substance Use Topics  . Smoking status: Former Smoker -- 1.00 packs/day for 15 years    Types: Cigarettes    Quit date: 12/10/1990  . Smokeless tobacco: Never Used     Comment: occ alcohol  . Alcohol Use: Yes     Comment: very rare   OB History    No data available     Review of Systems  All other systems reviewed and are negative.   Allergies  Codeine  Home Medications   Prior to Admission medications   Medication Sig Start Date End Date Taking? Authorizing Provider  allopurinol (ZYLOPRIM) 100 MG tablet Take 100 mg by mouth daily.    Historical Provider, MD  amLODipine (NORVASC) 10 MG tablet Take 10 mg by mouth daily.  10/06/13   Historical Provider, MD  aspirin EC 325 MG tablet Take 1 tablet (325 mg total) by mouth 2 (two) times daily. 04/28/15   Naiping Ephriam Jenkins, MD  cephALEXin (KEFLEX) 500 MG capsule Take 1 capsule (500 mg total) by mouth 2 (two) times daily. 07/24/15 07/29/15  Bjorn Pippin, PA-C  colchicine 0.6  MG tablet Take 0.6 mg by mouth 2 (two) times daily.    Historical Provider, MD  Multiple Vitamin (MULTIVITAMIN WITH MINERALS) TABS Take 1 tablet by mouth daily.    Historical Provider, MD  oxyCODONE (OXY IR/ROXICODONE) 5 MG immediate release tablet Take 1-3 tablets (5-15 mg total) by mouth every 4 (four) hours as needed. 04/28/15   Naiping Ephriam Jenkins, MD  polyethylene glycol (MIRALAX / GLYCOLAX) packet Take 17 g by mouth 2 (two) times daily.    Historical Provider, MD  rosuvastatin (CRESTOR) 20 MG tablet Take 20 mg by mouth daily.    Historical Provider, MD  senna-docusate (SENOKOT-S) 8.6-50 MG per tablet Take 2 tablets by mouth 2 (two) times daily.    Historical Provider, MD  SYNTHROID 125 MCG tablet TAKE ONE TABLET BY MOUTH ONCE DAILY 04/20/15   [name], MD  valsartan-hydrochlorothiazide (DIOVAN-HCT) 320-12.5 MG per  tablet Take 1 tablet by mouth daily. 10/06/13   Historical Provider, MD   BP 150/85 mmHg  Pulse 69  Temp(Src) 98.7 F (37.1 C) (Oral)  Resp 16  SpO2 97% Physical Exam  Constitutional: She is oriented to person, place, and time. She appears well-developed and well-nourished. No distress.  HENT:  Head: Normocephalic and atraumatic.  Pulmonary/Chest: Effort normal.  Neurological: She is alert and oriented to person, place, and time. No cranial nerve deficit.  Skin: Skin is warm and dry. She is not diaphoretic. There is erythema.  Right inner thigh just superior to the knee with warmth to touch and a 6x6 erythematous annular area, tender to palpation. Mild streaking proximally. No openings or drainage  Psychiatric: Her behavior is normal.  Nursing note and vitals reviewed.   ED Course  Procedures (including critical care time) Labs Review Labs Reviewed - No data to display  Imaging Review No results found.   MDM   1. Bee sting allergy   2. Cellulitis of lower extremity, unspecified laterality    1. Given degree of inflammatory response, erythema and warmth will treat with 80mg  of DepoMedrol, benadryl every 4-6 hours and empiric Keflex. She is to present to the ED should the area worsen or she develops worsening symptoms.     Bjorn Pippin, PA-C 07/24/15 1529

## 2015-07-24 NOTE — Discharge Instructions (Signed)
Bee, Wasp, or Hornet Sting Your caregiver has diagnosed you as having an insect sting. An insect sting appears as a red lump in the skin that sometimes has a tiny hole in the center, or it may have a stinger in the center of the wound. The most common stings are from wasps, hornets and bees. Individuals have different reactions to insect stings.  A normal reaction may cause pain, swelling, and redness around the sting site.  A localized allergic reaction may cause swelling and redness that extends beyond the sting site.  A large local reaction may continue to develop over the next 12 to 36 hours.  On occasion, the reactions can be severe (anaphylactic reaction). An anaphylactic reaction may cause wheezing; difficulty breathing; chest pain; fainting; raised, itchy, red patches on the skin; a sick feeling to your stomach (nausea); vomiting; cramping; or diarrhea. If you have had an anaphylactic reaction to an insect sting in the past, you are more likely to have one again. HOME CARE INSTRUCTIONS   With bee stings, a small sac of poison is left in the wound. Brushing across this with something such as a credit card, or anything similar, will help remove this and decrease the amount of the reaction. This same procedure will not help a wasp sting as they do not leave behind a stinger and poison sac.  Apply a cold compress for 10 to 20 minutes every hour for 1 to 2 days, depending on severity, to reduce swelling and itching.  To lessen pain, a paste made of water and baking soda may be rubbed on the bite or sting and left on for 5 minutes.  To relieve itching and swelling, you may use take medication or apply medicated creams or lotions as directed.  Only take over-the-counter or prescription medicines for pain, discomfort, or fever as directed by your caregiver.  Wash the sting site daily with soap and water. Apply antibiotic ointment on the sting site as directed.  If you suffered a severe  reaction:  If you did not require hospitalization, an adult will need to stay with you for 24 hours in case the symptoms return.  You may need to wear a medical bracelet or necklace stating the allergy.  You and your family need to learn when and how to use an anaphylaxis kit or epinephrine injection.  If you have had a severe reaction before, always carry your anaphylaxis kit with you. SEEK MEDICAL CARE IF:   None of the above helps within 2 to 3 days.  The area becomes red, warm, tender, and swollen beyond the area of the bite or sting.  You have an oral temperature above 102 F (38.9 C). SEEK IMMEDIATE MEDICAL CARE IF:  You have symptoms of an allergic reaction which are:  Wheezing.  Difficulty breathing.  Chest pain.  Lightheadedness or fainting.  Itchy, raised, red patches on the skin.  Nausea, vomiting, cramping or diarrhea. ANY OF THESE SYMPTOMS MAY REPRESENT A SERIOUS PROBLEM THAT IS AN EMERGENCY. Do not wait to see if the symptoms will go away. Get medical help right away. Call your local emergency services (911 in [place] DO NOT drive yourself to the hospital. MAKE SURE YOU:   Understand these instructions.  Will watch your condition.  Will get help right away if you are not doing well or get worse. Document Released: 11/26/2005 Document Revised: 02/18/2012 Document Reviewed: 05/13/2010 [facility] Patient Information 2015 [place], [place]. This information is not intended to replace advice given  to you by your health care provider. Make sure you discuss any questions you have with your health care provider.    You are having a local inflammatory response, but because of the degree of redness and warmth I am giving you an antibiotic. The steroid injection will help but continue Benadryl $RemoveBeforeDEI'25mg'[id number]$  every 4-6 hours for the next 36 hours. Feel better. Should you worsen please go to the ED

## 2015-08-09 DIAGNOSIS — Z029 Encounter for administrative examinations, unspecified: Secondary | ICD-10-CM

## 2015-09-16 ENCOUNTER — Telehealth: Payer: Self-pay | Admitting: *Deleted

## 2015-09-16 NOTE — Telephone Encounter (Signed)
Faxed CPAP order  For supplies given on 07/26/15 to [facility].

## 2015-10-12 ENCOUNTER — Other Ambulatory Visit: Payer: Self-pay | Admitting: Cardiology

## 2015-10-12 DIAGNOSIS — G9389 Other specified disorders of brain: Secondary | ICD-10-CM

## 2015-10-24 ENCOUNTER — Ambulatory Visit
Admission: RE | Admit: 2015-10-24 | Discharge: 2015-10-24 | Disposition: A | Discharge status: Home / Self Care | Payer: PPO | Source: Ambulatory Visit | Attending: Cardiology | Admitting: Cardiology

## 2015-10-24 DIAGNOSIS — G9389 Other specified disorders of brain: Secondary | ICD-10-CM

## 2015-10-24 MED ORDER — GADOBENATE DIMEGLUMINE 529 MG/ML IV SOLN: 20.0000 mL | Freq: Once | INTRAVENOUS | Status: DC | PRN

## 2015-11-14 ENCOUNTER — Emergency Department (HOSPITAL_COMMUNITY)
Admission: EM | Admit: 2015-11-14 | Discharge: 2015-11-14 | Disposition: A | Discharge status: Home / Self Care | Payer: No Typology Code available for payment source | Attending: Emergency Medicine | Admitting: Emergency Medicine

## 2015-11-14 ENCOUNTER — Emergency Department (HOSPITAL_COMMUNITY): Payer: No Typology Code available for payment source

## 2015-11-14 ENCOUNTER — Encounter (HOSPITAL_COMMUNITY): Payer: Self-pay | Admitting: Emergency Medicine

## 2015-11-14 DIAGNOSIS — Z8719 Personal history of other diseases of the digestive system: Secondary | ICD-10-CM | POA: Insufficient documentation

## 2015-11-14 DIAGNOSIS — Z9981 Dependence on supplemental oxygen: Secondary | ICD-10-CM | POA: Diagnosis not present

## 2015-11-14 DIAGNOSIS — S199XXA Unspecified injury of neck, initial encounter: Secondary | ICD-10-CM | POA: Insufficient documentation

## 2015-11-14 DIAGNOSIS — I10 Essential (primary) hypertension: Secondary | ICD-10-CM | POA: Insufficient documentation

## 2015-11-14 DIAGNOSIS — Z79899 Other long term (current) drug therapy: Secondary | ICD-10-CM | POA: Insufficient documentation

## 2015-11-14 DIAGNOSIS — G44319 Acute post-traumatic headache, not intractable: Secondary | ICD-10-CM

## 2015-11-14 DIAGNOSIS — Y9389 Activity, other specified: Secondary | ICD-10-CM | POA: Diagnosis not present

## 2015-11-14 DIAGNOSIS — M62838 Other muscle spasm: Secondary | ICD-10-CM | POA: Diagnosis not present

## 2015-11-14 DIAGNOSIS — G4730 Sleep apnea, unspecified: Secondary | ICD-10-CM | POA: Diagnosis not present

## 2015-11-14 DIAGNOSIS — S0990XA Unspecified injury of head, initial encounter: Secondary | ICD-10-CM | POA: Diagnosis present

## 2015-11-14 DIAGNOSIS — M542 Cervicalgia: Secondary | ICD-10-CM

## 2015-11-14 DIAGNOSIS — Z7982 Long term (current) use of aspirin: Secondary | ICD-10-CM | POA: Diagnosis not present

## 2015-11-14 DIAGNOSIS — S3992XA Unspecified injury of lower back, initial encounter: Secondary | ICD-10-CM | POA: Diagnosis not present

## 2015-11-14 DIAGNOSIS — Z859 Personal history of malignant neoplasm, unspecified: Secondary | ICD-10-CM | POA: Insufficient documentation

## 2015-11-14 DIAGNOSIS — I252 Old myocardial infarction: Secondary | ICD-10-CM | POA: Diagnosis not present

## 2015-11-14 DIAGNOSIS — Z87891 Personal history of nicotine dependence: Secondary | ICD-10-CM | POA: Insufficient documentation

## 2015-11-14 DIAGNOSIS — Y92410 Unspecified street and highway as the place of occurrence of the external cause: Secondary | ICD-10-CM | POA: Insufficient documentation

## 2015-11-14 DIAGNOSIS — Y998 Other external cause status: Secondary | ICD-10-CM | POA: Diagnosis not present

## 2015-11-14 DIAGNOSIS — S060X0A Concussion without loss of consciousness, initial encounter: Secondary | ICD-10-CM | POA: Diagnosis not present

## 2015-11-14 DIAGNOSIS — M069 Rheumatoid arthritis, unspecified: Secondary | ICD-10-CM | POA: Diagnosis not present

## 2015-11-14 DIAGNOSIS — E785 Hyperlipidemia, unspecified: Secondary | ICD-10-CM | POA: Insufficient documentation

## 2015-11-14 MED ORDER — ACETAMINOPHEN 325 MG PO TABS
650.0000 mg | ORAL_TABLET | Freq: Once | ORAL | Status: AC
  Administered 2015-11-14: 650 mg via ORAL
  Filled 2015-11-14: qty 2

## 2015-11-14 MED ORDER — CYCLOBENZAPRINE HCL 7.5 MG PO TABS: 7.5000 mg | ORAL_TABLET | Freq: Three times a day (TID) | ORAL | Status: DC | PRN

## 2015-11-14 MED ORDER — NAPROXEN 250 MG PO TABS: 250.0000 mg | ORAL_TABLET | Freq: Two times a day (BID) | ORAL | Status: DC | PRN

## 2015-11-14 MED ORDER — HYDROCODONE-ACETAMINOPHEN 5-325 MG PO TABS: 1.0000 | ORAL_TABLET | Freq: Four times a day (QID) | ORAL | Status: DC | PRN

## 2015-11-14 NOTE — ED Notes (Signed)
AVS explained in detail. Ambulatory with steady gait. Knows to follow up with PCP in one week. Knows to take medication as prescribed along with heat and cold therapy. No other c/c.
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
## 2016-01-04 DIAGNOSIS — G43909 Migraine, unspecified, not intractable, without status migrainosus: Secondary | ICD-10-CM | POA: Diagnosis not present

## 2016-01-04 DIAGNOSIS — I10 Essential (primary) hypertension: Secondary | ICD-10-CM | POA: Diagnosis not present

## 2016-01-16 DIAGNOSIS — H04123 Dry eye syndrome of bilateral lacrimal glands: Secondary | ICD-10-CM | POA: Diagnosis not present

## 2016-01-16 DIAGNOSIS — H43813 Vitreous degeneration, bilateral: Secondary | ICD-10-CM | POA: Diagnosis not present

## 2016-01-16 DIAGNOSIS — H25013 Cortical age-related cataract, bilateral: Secondary | ICD-10-CM | POA: Diagnosis not present

## 2016-01-18 DIAGNOSIS — E042 Nontoxic multinodular goiter: Secondary | ICD-10-CM | POA: Diagnosis not present

## 2016-01-18 DIAGNOSIS — R7302 Impaired glucose tolerance (oral): Secondary | ICD-10-CM | POA: Diagnosis not present

## 2016-01-18 DIAGNOSIS — E039 Hypothyroidism, unspecified: Secondary | ICD-10-CM | POA: Diagnosis not present

## 2016-03-29 DIAGNOSIS — M1000 Idiopathic gout, unspecified site: Secondary | ICD-10-CM | POA: Diagnosis not present

## 2016-03-29 DIAGNOSIS — Z79899 Other long term (current) drug therapy: Secondary | ICD-10-CM | POA: Diagnosis not present

## 2016-03-29 DIAGNOSIS — E559 Vitamin D deficiency, unspecified: Secondary | ICD-10-CM | POA: Diagnosis not present

## 2016-03-29 LAB — CBC AND DIFFERENTIAL
HCT: 44 % (ref 36–46)
HEMOGLOBIN: 14.5 g/dL (ref 12.0–16.0)
Platelets: 277 10*3/uL (ref 150–399)
WBC: 3.9 10*3/mL

## 2016-03-29 LAB — BASIC METABOLIC PANEL
BUN: 11 mg/dL (ref 4–21)
Creatinine: 0.8 mg/dL (ref 0.5–1.1)
Glucose: 97 mg/dL
POTASSIUM: 3.8 mmol/L (ref 3.4–5.3)
SODIUM: 142 mmol/L (ref 137–147)

## 2016-03-29 LAB — HEPATIC FUNCTION PANEL
ALT: 12 U/L (ref 7–35)
AST: 16 U/L (ref 13–35)
Alkaline Phosphatase: 105 U/L (ref 25–125)
Bilirubin, Total: 0.6 mg/dL

## 2016-04-02 DIAGNOSIS — M1A00X0 Idiopathic chronic gout, unspecified site, without tophus (tophi): Secondary | ICD-10-CM | POA: Diagnosis not present

## 2016-04-02 DIAGNOSIS — S82851D Displaced trimalleolar fracture of right lower leg, subsequent encounter for closed fracture with routine healing: Secondary | ICD-10-CM | POA: Diagnosis not present

## 2016-04-09 DIAGNOSIS — E559 Vitamin D deficiency, unspecified: Secondary | ICD-10-CM | POA: Diagnosis not present

## 2016-04-09 DIAGNOSIS — E042 Nontoxic multinodular goiter: Secondary | ICD-10-CM | POA: Diagnosis not present

## 2016-04-09 DIAGNOSIS — Z780 Asymptomatic menopausal state: Secondary | ICD-10-CM | POA: Diagnosis not present

## 2016-04-09 DIAGNOSIS — Z8262 Family history of osteoporosis: Secondary | ICD-10-CM | POA: Diagnosis not present

## 2016-04-09 DIAGNOSIS — R7302 Impaired glucose tolerance (oral): Secondary | ICD-10-CM | POA: Diagnosis not present

## 2016-04-09 DIAGNOSIS — Z1231 Encounter for screening mammogram for malignant neoplasm of breast: Secondary | ICD-10-CM | POA: Diagnosis not present

## 2016-04-09 DIAGNOSIS — E039 Hypothyroidism, unspecified: Secondary | ICD-10-CM | POA: Diagnosis not present

## 2016-07-29 ENCOUNTER — Other Ambulatory Visit: Payer: Self-pay

## 2016-07-29 ENCOUNTER — Encounter (HOSPITAL_COMMUNITY): Payer: Self-pay | Admitting: Emergency Medicine

## 2016-07-29 ENCOUNTER — Emergency Department (HOSPITAL_COMMUNITY): Payer: PPO

## 2016-07-29 ENCOUNTER — Inpatient Hospital Stay (HOSPITAL_COMMUNITY)
Admission: EM | Admit: 2016-07-29 | Discharge: 2016-08-02 | DRG: 310 | Disposition: A | Discharge status: Home / Self Care | Payer: PPO | Attending: Internal Medicine | Admitting: Internal Medicine

## 2016-07-29 ENCOUNTER — Encounter (HOSPITAL_COMMUNITY): Payer: Self-pay | Admitting: *Deleted

## 2016-07-29 ENCOUNTER — Ambulatory Visit (HOSPITAL_COMMUNITY)
Admission: EM | Admit: 2016-07-29 | Discharge: 2016-07-29 | Disposition: A | Discharge status: Nursing Home (Includes ICF) | Payer: PPO | Attending: Family Medicine | Admitting: Family Medicine

## 2016-07-29 DIAGNOSIS — R000 Tachycardia, unspecified: Secondary | ICD-10-CM | POA: Diagnosis not present

## 2016-07-29 DIAGNOSIS — R069 Unspecified abnormalities of breathing: Secondary | ICD-10-CM | POA: Diagnosis not present

## 2016-07-29 DIAGNOSIS — K219 Gastro-esophageal reflux disease without esophagitis: Secondary | ICD-10-CM | POA: Diagnosis not present

## 2016-07-29 DIAGNOSIS — Z9861 Coronary angioplasty status: Secondary | ICD-10-CM

## 2016-07-29 DIAGNOSIS — R079 Chest pain, unspecified: Secondary | ICD-10-CM | POA: Diagnosis not present

## 2016-07-29 DIAGNOSIS — I252 Old myocardial infarction: Secondary | ICD-10-CM | POA: Diagnosis not present

## 2016-07-29 DIAGNOSIS — R0602 Shortness of breath: Secondary | ICD-10-CM | POA: Diagnosis not present

## 2016-07-29 DIAGNOSIS — M109 Gout, unspecified: Secondary | ICD-10-CM | POA: Diagnosis not present

## 2016-07-29 DIAGNOSIS — E785 Hyperlipidemia, unspecified: Secondary | ICD-10-CM | POA: Diagnosis present

## 2016-07-29 DIAGNOSIS — G4733 Obstructive sleep apnea (adult) (pediatric): Secondary | ICD-10-CM | POA: Diagnosis not present

## 2016-07-29 DIAGNOSIS — I2510 Atherosclerotic heart disease of native coronary artery without angina pectoris: Secondary | ICD-10-CM | POA: Diagnosis present

## 2016-07-29 DIAGNOSIS — I4891 Unspecified atrial fibrillation: Principal | ICD-10-CM | POA: Diagnosis present

## 2016-07-29 DIAGNOSIS — M069 Rheumatoid arthritis, unspecified: Secondary | ICD-10-CM | POA: Diagnosis present

## 2016-07-29 DIAGNOSIS — E890 Postprocedural hypothyroidism: Secondary | ICD-10-CM | POA: Diagnosis not present

## 2016-07-29 DIAGNOSIS — I340 Nonrheumatic mitral (valve) insufficiency: Secondary | ICD-10-CM | POA: Diagnosis not present

## 2016-07-29 DIAGNOSIS — Z7982 Long term (current) use of aspirin: Secondary | ICD-10-CM | POA: Diagnosis not present

## 2016-07-29 DIAGNOSIS — I480 Paroxysmal atrial fibrillation: Secondary | ICD-10-CM | POA: Diagnosis not present

## 2016-07-29 DIAGNOSIS — I10 Essential (primary) hypertension: Secondary | ICD-10-CM | POA: Diagnosis not present

## 2016-07-29 LAB — CBC
HEMATOCRIT: 43.4 % (ref 36.0–46.0)
Hemoglobin: 13.9 g/dL (ref 12.0–15.0)
MCH: 29.1 pg (ref 26.0–34.0)
MCHC: 32 g/dL (ref 30.0–36.0)
MCV: 90.8 fL (ref 78.0–100.0)
Platelets: 257 10*3/uL (ref 150–400)
RBC: 4.78 MIL/uL (ref 3.87–5.11)
RDW: 14.9 % (ref 11.5–15.5)
WBC: 5 10*3/uL (ref 4.0–10.5)

## 2016-07-29 LAB — BASIC METABOLIC PANEL
Anion gap: 10 (ref 5–15)
BUN: 14 mg/dL (ref 6–20)
CO2: 21 mmol/L — AB (ref 22–32)
Calcium: 9.4 mg/dL (ref 8.9–10.3)
Chloride: 107 mmol/L (ref 101–111)
Creatinine, Ser: 0.96 mg/dL (ref 0.44–1.00)
GFR calc Af Amer: >60 mL/min (ref >60)
GFR, EST NON AFRICAN AMERICAN: 59 mL/min — AB (ref >60)
GLUCOSE: 119 mg/dL — AB (ref 65–99)
POTASSIUM: 3.5 mmol/L (ref 3.5–5.1)
Sodium: 138 mmol/L (ref 135–145)

## 2016-07-29 LAB — I-STAT TROPONIN, ED: Troponin i, poc: 0 ng/mL (ref 0.00–0.08)

## 2016-07-29 LAB — BRAIN NATRIURETIC PEPTIDE: B Natriuretic Peptide: 521.8 pg/mL — ABNORMAL HIGH (ref 0.0–100.0)

## 2016-07-29 MED ORDER — DILTIAZEM LOAD VIA INFUSION
20.0000 mg | Freq: Once | INTRAVENOUS | Status: AC
  Administered 2016-07-29: 20 mg via INTRAVENOUS
  Filled 2016-07-29: qty 20

## 2016-07-29 MED ORDER — VITAMIN D 1000 UNITS PO TABS
2000.0000 [IU] | ORAL_TABLET | Freq: Every day | ORAL | Status: DC
  Administered 2016-07-30 – 2016-08-02 (×4): 2000 [IU] via ORAL
  Filled 2016-07-29 (×4): qty 2

## 2016-07-29 MED ORDER — COLCHICINE 0.6 MG PO TABS: 0.6000 mg | ORAL_TABLET | Freq: Two times a day (BID) | ORAL | Status: DC

## 2016-07-29 MED ORDER — ASPIRIN EC 325 MG PO TBEC
325.0000 mg | DELAYED_RELEASE_TABLET | Freq: Every day | ORAL | Status: DC
  Administered 2016-07-30: 325 mg via ORAL
  Filled 2016-07-29: qty 1

## 2016-07-29 MED ORDER — VALSARTAN-HYDROCHLOROTHIAZIDE 320-12.5 MG PO TABS: 1.0000 | ORAL_TABLET | Freq: Every day | ORAL | Status: DC

## 2016-07-29 MED ORDER — ACETAMINOPHEN 325 MG PO TABS: 650.0000 mg | ORAL_TABLET | ORAL | Status: DC | PRN

## 2016-07-29 MED ORDER — HYDROCODONE-ACETAMINOPHEN 5-325 MG PO TABS: 1.0000 | ORAL_TABLET | Freq: Four times a day (QID) | ORAL | Status: DC | PRN

## 2016-07-29 MED ORDER — ROSUVASTATIN CALCIUM 20 MG PO TABS
20.0000 mg | ORAL_TABLET | Freq: Every day | ORAL | Status: DC
  Administered 2016-07-30 – 2016-08-02 (×4): 20 mg via ORAL
  Filled 2016-07-29 (×4): qty 1

## 2016-07-29 MED ORDER — LEVOTHYROXINE SODIUM 125 MCG PO TABS: 125.0000 ug | ORAL_TABLET | Freq: Every day | ORAL | Status: DC

## 2016-07-29 MED ORDER — FEBUXOSTAT 40 MG PO TABS
80.0000 mg | ORAL_TABLET | Freq: Every day | ORAL | Status: DC
  Administered 2016-07-30 – 2016-08-02 (×4): 80 mg via ORAL
  Filled 2016-07-29 (×4): qty 2

## 2016-07-29 MED ORDER — DILTIAZEM HCL 100 MG IV SOLR
5.0000 mg/h | INTRAVENOUS | Status: DC
  Administered 2016-07-29: 5 mg/h via INTRAVENOUS
  Administered 2016-07-30: 10 mg/h via INTRAVENOUS
  Filled 2016-07-29: qty 100

## 2016-07-29 MED ORDER — NAPROXEN 250 MG PO TABS: 250.0000 mg | ORAL_TABLET | Freq: Two times a day (BID) | ORAL | Status: DC | PRN

## 2016-07-29 MED ORDER — HEPARIN (PORCINE) IN NACL 100-0.45 UNIT/ML-% IJ SOLN
1250.0000 [IU]/h | INTRAMUSCULAR | Status: DC
  Administered 2016-07-29: 1250 [IU]/h via INTRAVENOUS
  Filled 2016-07-29: qty 250

## 2016-07-29 MED ORDER — CYCLOBENZAPRINE HCL 5 MG PO TABS: 7.5000 mg | ORAL_TABLET | Freq: Three times a day (TID) | ORAL | Status: DC | PRN

## 2016-07-29 MED ORDER — ONDANSETRON HCL 4 MG/2ML IJ SOLN: 4.0000 mg | Freq: Four times a day (QID) | INTRAMUSCULAR | Status: DC | PRN

## 2016-07-29 MED ORDER — HEPARIN BOLUS VIA INFUSION
4000.0000 [IU] | Freq: Once | INTRAVENOUS | Status: AC
  Administered 2016-07-29: 4000 [IU] via INTRAVENOUS
  Filled 2016-07-29: qty 4000

## 2016-07-29 MED ORDER — ALLOPURINOL 100 MG PO TABS: 100.0000 mg | ORAL_TABLET | Freq: Every day | ORAL | Status: DC

## 2016-07-29 NOTE — Progress Notes (Signed)
ANTICOAGULATION CONSULT NOTE - Initial Consult  Pharmacy Consult for Heparin Indication: atrial fibrillation  Allergies  Allergen Reactions  . Codeine Anxiety    Patient Measurements:   Heparin Dosing Weight: 80 kg  Vital Signs: Temp: 98.3 F (36.8 C) (08/20 2005) Temp Source: Oral (08/20 2005) BP: 117/80 (08/20 2229) Pulse Rate: 91 (08/20 2229)  Labs:  Recent Labs  07/29/16 2045  HGB 13.9  HCT 43.4  PLT 257  CREATININE 0.96    CrCl cannot be calculated (Unknown ideal weight.).   Medical History: Past Medical History:  Diagnosis Date  . Anginal pain ([place])    occ  . Arthritis    rheumatoid  . Cancer ([place])   . Family history of anesthesia complication    daughter has difficulty waking   . GERD (gastroesophageal reflux disease)    occ  . Hyperlipidemia   . Hypertension   . Myocardial infarction ([place])    92  . Shortness of breath    exersional  . Sleep apnea    cpap used 2 yrs    Medications:  No current facility-administered medications on file prior to encounter.    Current Outpatient Prescriptions on File Prior to Encounter  Medication Sig Dispense Refill  . allopurinol (ZYLOPRIM) 100 MG tablet Take 100 mg by mouth daily.    Marland Kitchen amLODipine (NORVASC) 10 MG tablet Take 10 mg by mouth daily.     Marland Kitchen aspirin EC 325 MG tablet Take 1 tablet (325 mg total) by mouth 2 (two) times daily. (Patient taking differently: Take 325 mg by mouth daily. ) 84 tablet 0  . Cholecalciferol (VITAMIN D-3 PO) Take 1 tablet by mouth once a week.    . colchicine 0.6 MG tablet Take 0.6 mg by mouth 2 (two) times daily.    . cyclobenzaprine (FEXMID) 7.5 MG tablet Take 1 tablet (7.5 mg total) by mouth 3 (three) times daily as needed for muscle spasms. 10 tablet 0  . febuxostat (ULORIC) 40 MG tablet Take 40 mg by mouth daily.    Marland Kitchen HYDROcodone-acetaminophen (NORCO) 5-325 MG tablet Take 1 tablet by mouth every 6 (six) hours as needed for severe pain. 6 tablet 0  . naproxen (NAPROSYN)  250 MG tablet Take 1 tablet (250 mg total) by mouth 2 (two) times daily as needed for mild pain, moderate pain or headache (TAKE WITH MEALS.). 15 tablet 0  . rosuvastatin (CRESTOR) 20 MG tablet Take 20 mg by mouth daily.    Marland Kitchen SYNTHROID 125 MCG tablet TAKE ONE TABLET BY MOUTH ONCE DAILY 30 tablet 0  . valsartan-hydrochlorothiazide (DIOVAN-HCT) 320-12.5 MG per tablet Take 1 tablet by mouth daily.       Assessment: 70 y.o. female with SOB, found to be in Afib, for heparin  Goal of Therapy:  Heparin level 0.3-0.7 units/ml Monitor platelets by anticoagulation protocol: Yes   Plan:  Heparin 4000 units IV bolus, then start heparin 1250 units/hr Check heparin level in 8 hours.  Annaliza Zia, Bronson Curb 07/29/2016,11:10 PM

## 2016-07-29 NOTE — ED Provider Notes (Signed)
[facility]    CSN: [id number] Arrival date & time: 07/29/16  1842  First Provider Contact:  First MD Initiated Contact with Patient 07/29/16 1910        History   Chief Complaint Chief Complaint  Patient presents with  . Shortness of Breath    HPI Claudia Cantrell is a 70 y.o. female.   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 She took her CPAP machine state on it all day long. The palpitations and shortness of breath have not let up.  Patient is a cardiac patient of Dr. Claiborne Billings.  Patient has some tightness in her chest radiating up her neck along with shortness of breath.      Past Medical History:  Diagnosis Date  . Anginal pain ([place])    occ  . Arthritis    rheumatoid  . Cancer ([place])   . Family history of anesthesia complication    daughter has difficulty waking   . GERD (gastroesophageal reflux disease)    occ  . Hyperlipidemia   . Hypertension   . Myocardial infarction ([place])    92  . Shortness of breath    exersional  . Sleep apnea    cpap used 2 yrs    Patient Active Problem List   Diagnosis Date Noted  . Closed right ankle fracture 04/24/2015  . Ankle fracture, bimalleolar, closed 04/24/2015  . 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 CAD in native artery 10/09/2013  . OSA on CPAP 10/09/2013  . Obesity, unspecified 05/27/2013  . Essential hypertension, benign 05/27/2013  . Other and unspecified hyperlipidemia 05/27/2013    Past Surgical History:  Procedure Laterality Date  . ABDOMINAL HYSTERECTOMY  1988  . BACK SURGERY  1994  . CARDIAC CATHETERIZATION  1991 AND 1992   [facility] BY DR Lamount Cohen  . ORIF ANKLE FRACTURE Right 04/27/2015   Procedure: OPEN REDUCTION INTERNAL FIXATION (ORIF) RIGHT BIMALLEOLAR ANKLE FRACTURE WITH SYNDESMOSIS FIXATION;  Surgeon: Leandrew Koyanagi, MD;  Location: [facility];  Service: Orthopedics;  Laterality: Right;  . THYROIDECTOMY   11/24/2013   DR Dalbert Batman  . THYROIDECTOMY N/A 11/24/2013   Procedure: TOTAL THYROIDECTOMY;  Surgeon: Adin Hector, MD;  Location: [facility];  Service: General;  Laterality: N/A;  . TRANSTHORACIC ECHOCARDIOGRAM  01/15/2013   EF 55% TO 65%. PROBABLE MILD HYPOKINESIS OF THE INFERIOR MYOCARDIUM. GRADE 1 DIASTOLIC DYSFUNCTION. TRIAL AR.LA IS MILDLY DILATED.    OB History    No data available       Home Medications    Prior to Admission medications   Medication Sig Start Date End Date Taking? Authorizing Provider  amLODipine (NORVASC) 10 MG tablet Take 10 mg by mouth daily.  10/06/13  Yes Historical Provider, MD  aspirin EC 325 MG tablet Take 1 tablet (325 mg total) by mouth 2 (two) times daily. Patient taking differently: Take 325 mg by mouth daily.  04/28/15  Yes Leandrew Koyanagi, MD  Cholecalciferol (VITAMIN D-3 PO) Take 1 tablet by mouth once a week.   Yes Historical Provider, MD  febuxostat (ULORIC) 40 MG tablet Take 40 mg by mouth daily.   Yes Historical Provider, MD  rosuvastatin (CRESTOR) 20 MG tablet Take 20 mg by mouth daily.   Yes Historical Provider, MD  SYNTHROID 125 MCG tablet TAKE ONE TABLET BY MOUTH ONCE DAILY 04/20/15  Yes [name], MD  valsartan-hydrochlorothiazide (DIOVAN-HCT) 320-12.5 MG per tablet Take 1 tablet by mouth  daily. 10/06/13  Yes Historical Provider, MD  allopurinol (ZYLOPRIM) 100 MG tablet Take 100 mg by mouth daily.    Historical Provider, MD  colchicine 0.6 MG tablet Take 0.6 mg by mouth 2 (two) times daily.    Historical Provider, MD  cyclobenzaprine (FEXMID) 7.5 MG tablet Take 1 tablet (7.5 mg total) by mouth 3 (three) times daily as needed for muscle spasms. 11/14/15   Mercedes Camprubi-Soms, PA-C  HYDROcodone-acetaminophen (NORCO) 5-325 MG tablet Take 1 tablet by mouth every 6 (six) hours as needed for severe pain. 11/14/15   Mercedes Camprubi-Soms, PA-C  naproxen (NAPROSYN) 250 MG tablet Take 1 tablet (250 mg total) by mouth 2 (two) times daily as needed for  mild pain, moderate pain or headache (TAKE WITH MEALS.). 11/14/15   Mercedes Camprubi-Soms, PA-C    Family History Family History  Problem Relation Age of Onset  . Heart disease Mother   . Sudden death Mother   . Diabetes Father   . Stroke Father   . Bone cancer Sister   . Sudden death Brother   . Melanoma Brother   . Heart disease Brother     Social History Social History  Substance Use Topics  . Smoking status: Former Smoker    Packs/day: 1.00    Years: 15.00    Types: Cigarettes    Quit date: 12/10/1990  . Smokeless tobacco: Never Used     Comment: occ alcohol  . Alcohol use Yes     Comment: very rare     Allergies   Codeine   Review of Systems Review of Systems  Constitutional: Positive for fatigue. Negative for fever and unexpected weight change.  HENT: Negative.   Eyes: Negative.   Respiratory: Positive for chest tightness and shortness of breath.   Cardiovascular: Positive for chest pain, palpitations and leg swelling.  Gastrointestinal: Negative.   Genitourinary: Negative.   Musculoskeletal: Positive for neck stiffness. Negative for arthralgias.       Patient's had her ankles operated on  Neurological: Negative.   Psychiatric/Behavioral: Negative.      Physical Exam Triage Vital Signs ED Triage Vitals  Enc Vitals Group     BP 07/29/16 1905 128/80     Pulse Rate 07/29/16 1905 86     Resp 07/29/16 1905 (!) 28     Temp --      Temp src --      SpO2 07/29/16 1905 99 %     Weight --      Height --      Head Circumference --      Peak Flow --      Pain Score 07/29/16 1907 2     Pain Loc --      Pain Edu? --      Excl. in [place]? --    No data found.   Updated Vital Signs BP 128/80   Pulse 86   Resp (!) 28   SpO2 99%   Visual Acuity Right Eye Distance:   Left Eye Distance:   Bilateral Distance:    Right Eye Near:   Left Eye Near:    Bilateral Near:     Physical Exam  Constitutional: She is oriented to person, place, and time. She  appears well-developed and well-nourished.  HENT:  Head: Normocephalic.  Mouth/Throat: Oropharynx is clear and moist.  Eyes: Conjunctivae and EOM are normal. Pupils are equal, round, and reactive to light.  Neck: Normal range of motion. Neck supple. No JVD present. No  thyromegaly present.  Cardiovascular: Intact distal pulses.  Exam reveals no gallop and no friction rub.   No murmur heard. Rate of approximately 100 bpm, irregularly irregular  Pulmonary/Chest: Effort normal and breath sounds normal.  Abdominal: Soft. Bowel sounds are normal. She exhibits no distension and no mass. There is no tenderness. There is no rebound and no guarding.  Musculoskeletal: Normal range of motion. She exhibits edema.  Lymphadenopathy:    She has no cervical adenopathy.  Neurological: She is alert and oriented to person, place, and time.  Skin: Skin is warm and dry.  Psychiatric: She has a normal mood and affect.  Nursing note and vitals reviewed.    UC Treatments / Results  Labs (all labs ordered are listed, but only abnormal results are displayed) Labs Reviewed - No data to display  EKG  EKG Interpretation None       Radiology No results found.  Procedures Procedures (including critical care time)  Medications Ordered in UC Medications - No data to display   Initial Impression / Assessment and Plan / UC Course  I have reviewed the triage vital signs and the nursing notes.  Pertinent labs & imaging results that were available during my care of the patient were reviewed by me and considered in my medical decision making (see chart for details).  Clinical Course  EKG Amalia Greenhouse is patient arrived. IV attempted at 7:05 and again at 7:15   Final Clinical Impressions(s) / UC Diagnoses   Final diagnoses:  Atrial fibrillation, new onset (HCC)  SOB (shortness of breath)    New Prescriptions New Prescriptions   No medications on file  Transfer to ED for further cardiac evaluation  and treatment   Robyn Haber, MD 07/29/16 [phone]

## 2016-07-29 NOTE — ED Provider Notes (Signed)
[facility] DEPT Provider Note   CSN: [id number] Arrival date & time: 07/29/16  1945     History   Chief Complaint Chief Complaint  Patient presents with  . Chest Pain  . Shortness of Breath    HPI Claudia Cantrell is a 70 y.o. female.  70 yo F with a chief complaint of shortness breath. This been going on for the past 3 days or so. Patient has been feeling weak for the past week or so. Symptoms really worsened today. Felt that she is having palpitations. Him substernal chest pain shortness of breath. Worse with exertion better with rest. She went to urgent care where she was found to be in A. fib with RVR and was sent here. Has no history of same.   The history is provided by the patient.  Chest Pain   This is a new problem. The current episode started less than 1 hour ago. The problem occurs constantly. The problem has not changed since onset.The pain is associated with exertion. The pain is present in the substernal region. The pain is at a severity of 6/10. The pain is moderate. The pain radiates to the epigastrium. Duration of episode(s) is 2 days. Pertinent negatives include no dizziness, no fever, no headaches, no nausea, no palpitations, no shortness of breath and no vomiting. She has tried nothing for the symptoms. The treatment provided no relief.  Shortness of Breath  Pertinent negatives include no fever, no headaches, no rhinorrhea, no wheezing, no chest pain and no vomiting.    Past Medical History:  Diagnosis Date  . Anginal pain ([place])    occ  . Arthritis    rheumatoid  . Cancer ([place])   . Family history of anesthesia complication    daughter has difficulty waking   . GERD (gastroesophageal reflux disease)    occ  . Hyperlipidemia   . Hypertension   . Myocardial infarction ([place])    92  . Shortness of breath    exersional  . Sleep apnea    cpap used 2 yrs    Patient Active Problem List   Diagnosis Date Noted  . Closed right ankle fracture 04/24/2015  .  Ankle fracture, bimalleolar, closed 04/24/2015  . 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 CAD in native artery 10/09/2013  . OSA on CPAP 10/09/2013  . Obesity, unspecified 05/27/2013  . Essential hypertension, benign 05/27/2013  . Other and unspecified hyperlipidemia 05/27/2013    Past Surgical History:  Procedure Laterality Date  . ABDOMINAL HYSTERECTOMY  1988  . BACK SURGERY  1994  . CARDIAC CATHETERIZATION  1991 AND 1992   PTCA BY DR Lamount Cohen  . ORIF ANKLE FRACTURE Right 04/27/2015   Procedure: OPEN REDUCTION INTERNAL FIXATION (ORIF) RIGHT BIMALLEOLAR ANKLE FRACTURE WITH SYNDESMOSIS FIXATION;  Surgeon: Leandrew Koyanagi, MD;  Location: [facility];  Service: Orthopedics;  Laterality: Right;  . THYROIDECTOMY  11/24/2013   DR Dalbert Batman  . THYROIDECTOMY N/A 11/24/2013   Procedure: TOTAL THYROIDECTOMY;  Surgeon: Adin Hector, MD;  Location: [facility];  Service: General;  Laterality: N/A;  . TRANSTHORACIC ECHOCARDIOGRAM  01/15/2013   EF 55% TO 65%. PROBABLE MILD HYPOKINESIS OF THE INFERIOR MYOCARDIUM. GRADE 1 DIASTOLIC DYSFUNCTION. TRIAL AR.LA IS MILDLY DILATED.    OB History    No data available       Home Medications    Prior to Admission medications   Medication Sig Start Date End Date Taking? Authorizing Provider  allopurinol (ZYLOPRIM) 100 MG tablet Take 100  mg by mouth daily.    Historical Provider, MD  amLODipine (NORVASC) 10 MG tablet Take 10 mg by mouth daily.  10/06/13   Historical Provider, MD  aspirin EC 325 MG tablet Take 1 tablet (325 mg total) by mouth 2 (two) times daily. Patient taking differently: Take 325 mg by mouth daily.  04/28/15   Leandrew Koyanagi, MD  Cholecalciferol (VITAMIN D-3 PO) Take 1 tablet by mouth once a week.    Historical Provider, MD  colchicine 0.6 MG tablet Take 0.6 mg by mouth 2 (two) times daily.    Historical Provider, MD  cyclobenzaprine (FEXMID) 7.5 MG tablet Take 1 tablet (7.5 mg total) by mouth 3 (three) times daily as needed for muscle  spasms. 11/14/15   Mercedes Camprubi-Soms, PA-C  febuxostat (ULORIC) 40 MG tablet Take 40 mg by mouth daily.    Historical Provider, MD  HYDROcodone-acetaminophen (NORCO) 5-325 MG tablet Take 1 tablet by mouth every 6 (six) hours as needed for severe pain. 11/14/15   Mercedes Camprubi-Soms, PA-C  naproxen (NAPROSYN) 250 MG tablet Take 1 tablet (250 mg total) by mouth 2 (two) times daily as needed for mild pain, moderate pain or headache (TAKE WITH MEALS.). 11/14/15   Mercedes Camprubi-Soms, PA-C  rosuvastatin (CRESTOR) 20 MG tablet Take 20 mg by mouth daily.    Historical Provider, MD  SYNTHROID 125 MCG tablet TAKE ONE TABLET BY MOUTH ONCE DAILY 04/20/15   [name], MD  valsartan-hydrochlorothiazide (DIOVAN-HCT) 320-12.5 MG per tablet Take 1 tablet by mouth daily. 10/06/13   Historical Provider, MD    Family History Family History  Problem Relation Age of Onset  . Heart disease Mother   . Sudden death Mother   . Diabetes Father   . Stroke Father   . Bone cancer Sister   . Sudden death Brother   . Melanoma Brother   . Heart disease Brother     Social History Social History  Substance Use Topics  . Smoking status: Former Smoker    Packs/day: 1.00    Years: 15.00    Types: Cigarettes    Quit date: 12/10/1990  . Smokeless tobacco: Never Used     Comment: occ alcohol  . Alcohol use Yes     Comment: very rare     Allergies   Codeine   Review of Systems Review of Systems  Constitutional: Negative for chills and fever.  HENT: Negative for congestion and rhinorrhea.   Eyes: Negative for redness and visual disturbance.  Respiratory: Negative for shortness of breath and wheezing.   Cardiovascular: Negative for chest pain and palpitations.  Gastrointestinal: Negative for nausea and vomiting.  Genitourinary: Negative for dysuria and urgency.  Musculoskeletal: Negative for arthralgias and myalgias.  Skin: Negative for pallor and wound.  Neurological: Negative for dizziness and  headaches.     Physical Exam Updated Vital Signs BP 117/80 (BP Location: Left Arm)   Pulse 91   Temp 98.3 F (36.8 C) (Oral)   Resp 17   SpO2 93%   Physical Exam  Constitutional: She is oriented to person, place, and time. She appears well-developed and well-nourished. No distress.  HENT:  Head: Normocephalic and atraumatic.  Eyes: EOM are normal. Pupils are equal, round, and reactive to light.  Neck: Normal range of motion. Neck supple.  Cardiovascular: Normal rate.  Exam reveals no gallop and no friction rub.   No murmur heard. Pulmonary/Chest: Effort normal. She has no wheezes. She has no rales.  Abdominal: Soft. She exhibits  no distension. There is no tenderness.  Musculoskeletal: She exhibits no edema or tenderness.  Neurological: She is alert and oriented to person, place, and time.  Skin: Skin is warm and dry. She is not diaphoretic.  Psychiatric: She has a normal mood and affect. Her behavior is normal.  Nursing note and vitals reviewed.    ED Treatments / Results  Labs (all labs ordered are listed, but only abnormal results are displayed) Labs Reviewed  BASIC METABOLIC PANEL - Abnormal; Notable for the following:       Result Value   CO2 21 (*)    Glucose, Bld 119 (*)    GFR calc non Af Amer 59 (*)    All other components within normal limits  BRAIN NATRIURETIC PEPTIDE - Abnormal; Notable for the following:    B Natriuretic Peptide 521.8 (*)    All other components within normal limits  CBC  I-STAT TROPOININ, ED    EKG  EKG Interpretation None       Radiology No results found.  Procedures Procedures (including critical care time)  Medications Ordered in ED Medications  diltiazem (CARDIZEM) 1 mg/mL load via infusion 20 mg (20 mg Intravenous Bolus from Bag 07/29/16 2205)    And  diltiazem (CARDIZEM) 100 mg in dextrose 5 % 100 mL (1 mg/mL) infusion (5 mg/hr Intravenous New Bag/Given 07/29/16 2210)     Initial Impression / Assessment and Plan /  ED Course  I have reviewed the triage vital signs and the nursing notes.  Pertinent labs & imaging results that were available during my care of the patient were reviewed by me and considered in my medical decision making (see chart for details).  Clinical Course    70 yo F With a chief complaint of atrial fibrillation with RVR. Patient was started on diltiazem with significant improvement of her heart rate. I did not cardioversion as the patient's symptoms are not readily identifiable as being <48 hours. Discussed with cardiology.   The patients results and plan were reviewed and discussed.   Any x-rays performed were independently reviewed by myself.   CRITICAL CARE Performed by: Cecilio Asper   Total critical care time: 45 minutes  Critical care time was exclusive of separately billable procedures and treating other patients.  Critical care was necessary to treat or prevent imminent or life-threatening deterioration.  Critical care was time spent personally by me on the following activities: development of treatment plan with patient and/or surrogate as well as nursing, discussions with consultants, evaluation of patient's response to treatment, examination of patient, obtaining history from patient or surrogate, ordering and performing treatments and interventions, ordering and review of laboratory studies, ordering and review of radiographic studies, pulse oximetry and re-evaluation of patient's condition.   Differential diagnosis were considered with the presenting HPI.  Medications  diltiazem (CARDIZEM) 1 mg/mL load via infusion 20 mg (20 mg Intravenous Bolus from Bag 07/29/16 2205)    And  diltiazem (CARDIZEM) 100 mg in dextrose 5 % 100 mL (1 mg/mL) infusion (5 mg/hr Intravenous New Bag/Given 07/29/16 2210)    Vitals:   07/29/16 1951 07/29/16 2005 07/29/16 2229  BP:   117/80  Pulse: 118 (!) 134 91  Resp: 20  17  Temp: 98.3 F (36.8 C) 98.3 F (36.8 C)   TempSrc:   Oral   SpO2: 96%  93%    Final diagnoses:  Atrial fibrillation with RVR (HCC)    Admission/ observation were discussed with the admitting physician, patient  and/or family and they are comfortable with the plan.    Final Clinical Impressions(s) / ED Diagnoses   Final diagnoses:  Atrial fibrillation with RVR [facility])    New Prescriptions New Prescriptions   No medications on file     Deno Etienne, DO 07/29/16 2308

## 2016-07-29 NOTE — ED Triage Notes (Signed)
Woke up yesterday morning feeling very SOB, along with a "strange discomfort" in left lateral neck.  Continues to feel SOB; had to use CPAP yesterday "to get my breath".  HR irreg to palpation; denies hx of irreg HR.

## 2016-07-29 NOTE — ED Triage Notes (Signed)
Pt is coming from UC with complaints of chest pain and pressure that started yesterday. UC found pt was in afib and sent to ED for further evaluation. No hx of afib

## 2016-07-29 NOTE — ED Notes (Signed)
Called pharmacy about running heparin and cardizem together. Pharm D oked

## 2016-07-29 NOTE — ED Notes (Signed)
[facility] truck unavailable.  [facility] notified of need for ALS tranport.  Report called to Santiago Glad, ED Agricultural consultant.

## 2016-07-29 NOTE — ED Notes (Signed)
Unsuccessful peripheral IV attempts.

## 2016-07-30 ENCOUNTER — Other Ambulatory Visit: Payer: Self-pay

## 2016-07-30 ENCOUNTER — Encounter (HOSPITAL_COMMUNITY): Payer: Self-pay | Admitting: General Practice

## 2016-07-30 DIAGNOSIS — I481 Persistent atrial fibrillation: Secondary | ICD-10-CM

## 2016-07-30 DIAGNOSIS — I2510 Atherosclerotic heart disease of native coronary artery without angina pectoris: Secondary | ICD-10-CM | POA: Diagnosis not present

## 2016-07-30 DIAGNOSIS — I4891 Unspecified atrial fibrillation: Secondary | ICD-10-CM | POA: Diagnosis not present

## 2016-07-30 DIAGNOSIS — I10 Essential (primary) hypertension: Secondary | ICD-10-CM | POA: Diagnosis not present

## 2016-07-30 LAB — LIPID PANEL
Cholesterol: 138 mg/dL (ref 0–200)
HDL: 31 mg/dL — ABNORMAL LOW (ref >40)
LDL CALC: 81 mg/dL (ref 0–99)
TRIGLYCERIDES: 131 mg/dL (ref <150)
Total CHOL/HDL Ratio: 4.5 RATIO
VLDL: 26 mg/dL (ref 0–40)

## 2016-07-30 LAB — BASIC METABOLIC PANEL
ANION GAP: 9 (ref 5–15)
BUN: 12 mg/dL (ref 6–20)
CO2: 24 mmol/L (ref 22–32)
Calcium: 9.1 mg/dL (ref 8.9–10.3)
Chloride: 107 mmol/L (ref 101–111)
Creatinine, Ser: 0.77 mg/dL (ref 0.44–1.00)
GFR calc Af Amer: >60 mL/min (ref >60)
Glucose, Bld: 98 mg/dL (ref 65–99)
POTASSIUM: 3.5 mmol/L (ref 3.5–5.1)
SODIUM: 140 mmol/L (ref 135–145)

## 2016-07-30 LAB — HEPARIN LEVEL (UNFRACTIONATED): HEPARIN UNFRACTIONATED: 0.48 [IU]/mL (ref 0.30–0.70)

## 2016-07-30 LAB — TROPONIN I: Troponin I: <0.03 ng/mL (ref <0.03)

## 2016-07-30 LAB — CBC
HCT: 44.1 % (ref 36.0–46.0)
Hemoglobin: 13.6 g/dL (ref 12.0–15.0)
MCH: 28.2 pg (ref 26.0–34.0)
MCHC: 30.8 g/dL (ref 30.0–36.0)
MCV: 91.5 fL (ref 78.0–100.0)
PLATELETS: 245 10*3/uL (ref 150–400)
RBC: 4.82 MIL/uL (ref 3.87–5.11)
RDW: 15.1 % (ref 11.5–15.5)
WBC: 5.3 10*3/uL (ref 4.0–10.5)

## 2016-07-30 LAB — PROTIME-INR
INR: 1.08
PROTHROMBIN TIME: 14 s (ref 11.4–15.2)

## 2016-07-30 LAB — TSH: TSH: 1.313 u[IU]/mL (ref 0.350–4.500)

## 2016-07-30 MED ORDER — APIXABAN 5 MG PO TABS
5.0000 mg | ORAL_TABLET | Freq: Two times a day (BID) | ORAL | Status: DC
  Administered 2016-07-30 – 2016-08-02 (×7): 5 mg via ORAL
  Filled 2016-07-30 (×8): qty 1

## 2016-07-30 MED ORDER — IRBESARTAN 300 MG PO TABS
300.0000 mg | ORAL_TABLET | Freq: Every day | ORAL | Status: DC
  Administered 2016-07-30: 300 mg via ORAL
  Filled 2016-07-30: qty 1

## 2016-07-30 MED ORDER — DILTIAZEM HCL 60 MG PO TABS
60.0000 mg | ORAL_TABLET | Freq: Four times a day (QID) | ORAL | Status: DC
  Administered 2016-07-30 – 2016-08-01 (×8): 60 mg via ORAL
  Filled 2016-07-30 (×8): qty 1

## 2016-07-30 MED ORDER — HYDROCHLOROTHIAZIDE 12.5 MG PO CAPS
12.5000 mg | ORAL_CAPSULE | Freq: Every day | ORAL | Status: DC
  Administered 2016-07-30: 12.5 mg via ORAL
  Filled 2016-07-30: qty 1

## 2016-07-30 MED ORDER — LEVOTHYROXINE SODIUM 25 MCG PO TABS
125.0000 ug | ORAL_TABLET | Freq: Every day | ORAL | Status: DC
  Administered 2016-07-30 – 2016-08-02 (×3): 125 ug via ORAL
  Filled 2016-07-30 (×3): qty 1

## 2016-07-30 NOTE — Consult Note (Signed)
   [facility] Inpatient Consult   07/30/2016  Claudia Cantrell 1946-03-05 [id number]  Patient evaluated for community based chronic disease management services with [facility] as a benefit of patient's [employer] Medicare Insurance. Spoke with patient at bedside to explain [place] Management services.  Chart review reveals the patient is a 70 y.o. female continues on heparin for new onset AFib. Patient verbalize desire for post hospital follow up.  She states that Dr. Wallene Huh is her primary care provider and her cardiologist.  She states she realizes she pays more for her co-pays but she has been seeing him for years and most comfortable with him as her primary provider.  Consent form signed.  Patient will receive post hospital discharge call and will be evaluated for monthly home visits for assessments and disease process education.  Left contact information and [facility] literature at bedside. Made Inpatient Case Manager aware that [facility] Management following. Of note, [facility] Care Management services does not replace or interfere with any services that are arranged by inpatient case management or social work.  For additional questions or referrals please contact:    Natividad Brood, RN BSN [facility] Liaison  [phone] business mobile phone Toll free office [phone]

## 2016-07-30 NOTE — Progress Notes (Signed)
Patient Name: Claudia Cantrell Date of Encounter: 07/30/2016  Hospital Problem List     Active Problems:   Atrial fibrillation [facility])    Subjective   Feeling much better this morning. States her chest pain is essentially gone.   Inpatient Medications    . aspirin EC  325 mg Oral Daily  . cholecalciferol  2,000 Units Oral Daily  . febuxostat  80 mg Oral Daily  . irbesartan  300 mg Oral Daily   And  . hydrochlorothiazide  12.5 mg Oral Daily  . levothyroxine  125 mcg Oral QAC breakfast  . rosuvastatin  20 mg Oral Daily    Vital Signs    Vitals:   07/30/16 0130 07/30/16 0215 07/30/16 0306 07/30/16 1156  BP: 136/75 122/82 115/72 101/70  Pulse: 71 86 76 72  Resp: 19 26 20 18   Temp:   98.2 F (36.8 C) 97.4 F (36.3 C)  TempSrc:   Oral Oral  SpO2: 94% 96% 97% 96%  Weight:   222 lb 6.4 oz (100.9 kg)   Height:   5\' 5"  (1.651 m)     Intake/Output Summary (Last 24 hours) at 07/30/16 1250 Last data filed at 07/30/16 0957  Gross per 24 hour  Intake           253.75 ml  Output                0 ml  Net           253.75 ml   Filed Weights   07/30/16 0306  Weight: 222 lb 6.4 oz (100.9 kg)    Physical Exam    General: Pleasant obese AA female, NAD. Neuro: Alert and oriented X 3. Moves all extremities spontaneously. Psych: Normal affect. HEENT:  Normal  Neck: Supple without bruits or JVD. Lungs:  Resp regular and unlabored, CTA. Heart: Irregularly Irregular no s3, s4, or murmurs. Abdomen: Soft, non-tender, non-distended, BS + x 4.  Extremities: No clubbing, cyanosis or edema. DP/PT/Radials 2+ and equal bilaterally.  Labs    CBC  Recent Labs  07/29/16 2045 07/30/16 0550  WBC 5.0 5.3  HGB 13.9 13.6  HCT 43.4 44.1  MCV 90.8 91.5  PLT 257 [id number]   Basic Metabolic Panel  Recent Labs  07/29/16 2045 07/30/16 0550  NA 138 140  K 3.5 3.5  CL 107 107  CO2 21* 24  GLUCOSE 119* 98  BUN 14 12  CREATININE 0.96 0.77  CALCIUM 9.4 9.1   Liver Function Tests No  results for input(s): AST, ALT, ALKPHOS, BILITOT, PROT, ALBUMIN in the last 72 hours. No results for input(s): LIPASE, AMYLASE in the last 72 hours. Cardiac Enzymes  Recent Labs  07/30/16 0126 07/30/16 0550  TROPONINI <0.03 <0.03   BNP Invalid input(s): POCBNP D-Dimer No results for input(s): DDIMER in the last 72 hours. Hemoglobin A1C No results for input(s): HGBA1C in the last 72 hours. Fasting Lipid Panel  Recent Labs  07/30/16 0550  CHOL 138  HDL 31*  LDLCALC 81  TRIG 131  CHOLHDL 4.5   Thyroid Function Tests No results for input(s): TSH, T4TOTAL, T3FREE, THYROIDAB in the last 72 hours.  Invalid input(s): FREET3  Telemetry    A-fib, rate better controlled (70s)  ECG    None this morning  Radiology     Assessment & Plan    70 yo female with PMH of CAD s/p PTCA with Dr. Darci Current) and 2011 with Dr. Claiborne Billings with nonobstuctive CAD/HTN/HLD/OSA/Obesity and hypothyroidism  with multinodular goiter who presented to the ED with reports of chest pain and found to be in a-fib RVR.   1. Atrial Fibrillation with RVR: Heart rate better controlled on diltiazem gtt. Rates now in the 39s, 10s. Reports her chest pain has essentially resolved. Currently on dilt at 10mg /hr, and heparin drip. Given heart rate is stable, could transition to PO dilt. I have discussed the option of Eliquis for anticoagulation. -- This patients CHA2DS2-VASc Score and unadjusted Ischemic Stroke Rate (% per year) is equal to 2.2 % stroke rate/year from a score of 2 Above score calculated as 1 point each if present [CHF, HTN, DM, Vascular=MI/PAD/Aortic Plaque, Age if 65-74, or Female] Above score calculated as 2 points each if present [Age > 75, or Stroke/TIA/TE] -- 2D echo  2. Hypertension:  -continue valsartan-HCTZ   3. CAD s/p PCTA  -Continue ASA and statin. -Cardiac enzymes cycled and negative x3.  4. Gout -Continue outpatient medications  5. Hypothyroidism - Reports having trouble with  her thyroid medications in the past, having been adjusted often. Check TSH   6. DVT prophylaxis   Signed, Reino Bellis NP-C Pager [phone] As above, patient seen and examined. She is much improved this morning with control of her heart rate. At present she denies chest pain, dyspnea or palpitations. She has ruled out.  1 new-onset atrial fibrillation-I would discontinue IV Cardizem and begin oral. Discontinue heparin and begin apixaban 5 mg twice a day. Check echocardiogram and TSH. We will reassess tomorrow morning. If she is asymptomatic on rate control and discharge and she will return in 4 weeks for cardioversion. If she remains symptomatic despite rate control or her rate is difficult to control planned TEE guided cardioversion. Note I will hold her valsartan HCT as her blood pressure is borderline and we are adding Cardizem.  2 hypertension-as above we will discontinue valsartan HCT and begin Cardizem. Follow blood pressure and adjust as needed. 3 Discontinue aspirin given need for anticoagulation.  Kirk Ruths, MD

## 2016-07-30 NOTE — Progress Notes (Signed)
ANTICOAGULATION CONSULT NOTE - Initial Consult  Pharmacy Consult for Heparin Indication: atrial fibrillation  Allergies  Allergen Reactions  . Codeine Anxiety    Patient Measurements: Height: 5\' 5"  (165.1 cm) Weight: 222 lb 6.4 oz (100.9 kg) IBW/kg (Calculated) : 57 Heparin Dosing Weight: 80 kg  Vital Signs: Temp: 98.2 F (36.8 C) (08/21 0306) Temp Source: Oral (08/21 0306) BP: 115/72 (08/21 0306) Pulse Rate: 76 (08/21 0306)  Labs:  Recent Labs  07/29/16 2045 07/30/16 0126 07/30/16 0550 07/30/16 0815  HGB 13.9  --  13.6  --   HCT 43.4  --  44.1  --   PLT 257  --  245  --   LABPROT  --   --  14.0  --   INR  --   --  1.08  --   HEPARINUNFRC  --   --   --  0.48  CREATININE 0.96  --  0.77  --   TROPONINI  --  <0.03 <0.03  --     Estimated Creatinine Clearance: 79.3 mL/min (by C-G formula based on SCr of 0.8 mg/dL).   Medical History: Past Medical History:  Diagnosis Date  . Anginal pain ([place])    occ  . Arthritis    rheumatoid  . Cancer ([place])   . Family history of anesthesia complication    daughter has difficulty waking   . GERD (gastroesophageal reflux disease)    occ  . Hyperlipidemia   . Hypertension   . Myocardial infarction ([place])    92  . Shortness of breath    exersional  . Sleep apnea    cpap used 2 yrs    Medications:  No current facility-administered medications on file prior to encounter.    Current Outpatient Prescriptions on File Prior to Encounter  Medication Sig Dispense Refill  . amLODipine (NORVASC) 10 MG tablet Take 10 mg by mouth daily.     Marland Kitchen aspirin EC 325 MG tablet Take 1 tablet (325 mg total) by mouth 2 (two) times daily. (Patient taking differently: Take 325 mg by mouth daily. ) 84 tablet 0  . Cholecalciferol (VITAMIN D-3 PO) Take 2,000 Units by mouth daily.     . febuxostat (ULORIC) 40 MG tablet Take 80 mg by mouth daily.     . rosuvastatin (CRESTOR) 20 MG tablet Take 20 mg by mouth daily.    Marland Kitchen SYNTHROID 125 MCG tablet  TAKE ONE TABLET BY MOUTH ONCE DAILY 30 tablet 0  . valsartan-hydrochlorothiazide (DIOVAN-HCT) 320-12.5 MG per tablet Take 1 tablet by mouth daily.       Assessment: 70 y.o. female continues on heparin for new onset AFib. CHA2DS2-VASc = 4. No PTA anti-coag - INR on admission 1.08. HL therapeutic this AM at 0.48. CBC WNL and stable. No bleeding reported.   Goal of Therapy:  Heparin level 0.3-0.7 units/ml Monitor platelets by anticoagulation protocol: Yes   Plan:  -Continue heparin at 1250 units/hr -Daily HL/CBC -Monitor s/sx bleeding -F/U long-term [facility] plans   Stephens November, PharmD Clinical Pharmacist 9:32 AM, 07/30/2016

## 2016-07-30 NOTE — H&P (Signed)
Cardiology History & Physical    Patient ID: Claudia Cantrell MRN: [id number], DOB/AGE: 05-18-1946   Admit date: 07/29/2016 Date of Consult: 07/30/2016  Primary Physician: Patricia Nettle, MD Reason for Admission: Atrial Fibrillation   Primary Cardiologist: Dr. Claiborne Billings  History of Present Illness    70 year old female with past medical history of coronary artery disease, arthritis, GERD, and OSA presented to the emergency department with complaints of shortness of breath and chest pain for the past 2-3 days. The patient describes the chest pain as a sharp sensation which is different from the time she had a myocardial infarction. The patient says the chest pain is substernal in nature and is associated with exertion and shortness of breath. She denies N/V, diaphoresis, and syncope. Upon arrival to the emergency department, the patient was found to be in atrial fibrillation with rapid ventricular response. The patient was given a bolus of diltiazem and starting on a diltiazem gtt with improvement of her heart rate into the high 90's. At this time, the patient is chest pain free and is without complaints.   Past Medical History   Past Medical History:  Diagnosis Date  . Anginal pain ([place])    occ  . Arthritis    rheumatoid  . Cancer ([place])   . Family history of anesthesia complication    daughter has difficulty waking   . GERD (gastroesophageal reflux disease)    occ  . Hyperlipidemia   . Hypertension   . Myocardial infarction ([place])    92  . Shortness of breath    exersional  . Sleep apnea    cpap used 2 yrs    Past Surgical History:  Procedure Laterality Date  . ABDOMINAL HYSTERECTOMY  1988  . BACK SURGERY  1994  . CARDIAC CATHETERIZATION  1991 AND 1992   PTCA BY DR Lamount Cohen  . ORIF ANKLE FRACTURE Right 04/27/2015   Procedure: OPEN REDUCTION INTERNAL FIXATION (ORIF) RIGHT BIMALLEOLAR ANKLE FRACTURE WITH SYNDESMOSIS FIXATION;  Surgeon: Leandrew Koyanagi, MD;  Location: [facility];   Service: Orthopedics;  Laterality: Right;  . THYROIDECTOMY  11/24/2013   DR Dalbert Batman  . THYROIDECTOMY N/A 11/24/2013   Procedure: TOTAL THYROIDECTOMY;  Surgeon: Adin Hector, MD;  Location: [facility];  Service: General;  Laterality: N/A;  . TRANSTHORACIC ECHOCARDIOGRAM  01/15/2013   EF 55% TO 65%. PROBABLE MILD HYPOKINESIS OF THE INFERIOR MYOCARDIUM. GRADE 1 DIASTOLIC DYSFUNCTION. TRIAL AR.LA IS MILDLY DILATED.     Allergies  Allergies  Allergen Reactions  . Codeine Anxiety    Inpatient Medications    . allopurinol  100 mg Oral Daily  . aspirin EC  325 mg Oral Daily  . colchicine  0.6 mg Oral BID  . febuxostat  40 mg Oral Daily  . levothyroxine  125 mcg Oral Daily  . rosuvastatin  20 mg Oral Daily  . valsartan-hydrochlorothiazide  1 tablet Oral Daily  . Vitamin D-3   Oral Weekly    Family History    Family History  Problem Relation Age of Onset  . Heart disease Mother   . Sudden death Mother   . Diabetes Father   . Stroke Father   . Bone cancer Sister   . Sudden death Brother   . Melanoma Brother   . Heart disease Brother     Social History    Social History   Social History  . Marital status: Divorced    Spouse name: N/A  . Number of  children: N/A  . Years of education: N/A   Occupational History  . Not on file.   Social History Main Topics  . Smoking status: Former Smoker    Packs/day: 1.00    Years: 15.00    Types: Cigarettes    Quit date: 12/10/1990  . Smokeless tobacco: Never Used     Comment: occ alcohol  . Alcohol use Yes     Comment: very rare  . Drug use: No  . Sexual activity: Not on file   Other Topics Concern  . Not on file   Social History Narrative  . No narrative on file     Review of Systems   All other systems reviewed and are otherwise negative except as noted above.  Physical Exam    Blood pressure 117/80, pulse 91, temperature 98.3 F (36.8 C), temperature source Oral, resp. rate 17, SpO2 93 %.  GEN: NAD, A&Ox3 HEENT:  NCAT, EOMI, No JVD CV: Irregular, +S1/S2, No m/r/g's appreciated.   Pulm: CTA bilaterally  Abdomen: +BS, NTND Extremities: No c/c. Trace bilateral lower extremity edema, 2+ pulses  Neuro: No focal neurological deficits, CN II-XII Grossly intact.    Labs    Troponin [facility] Test)  Recent Labs  07/29/16 2047  TROPIPOC 0.00   No results for input(s): CKTOTAL, CKMB, TROPONINI in the last 72 hours. Lab Results  Component Value Date   WBC 5.0 07/29/2016   HGB 13.9 07/29/2016   HCT 43.4 07/29/2016   MCV 90.8 07/29/2016   PLT 257 07/29/2016    Recent Labs Lab 07/29/16 2045  NA 138  K 3.5  CL 107  CO2 21*  BUN 14  CREATININE 0.96  CALCIUM 9.4  GLUCOSE 119*   Lab Results  Component Value Date   CHOL (H) 11/24/2009    220        ATP III CLASSIFICATION:  <200     mg/dL   Desirable  200-239  mg/dL   Borderline High  >=240    mg/dL   High        HEMOLYZED SPECIMEN, RESULTS MAY BE AFFECTED   HDL 39 (L) 11/24/2009   LDLCALC (H) 11/24/2009    146        Total Cholesterol/HDL:CHD Risk Coronary Heart Disease Risk Table                     Men   Women  1/2 Average Risk   3.4   3.3  Average Risk       5.0   4.4  2 X Average Risk   9.6   7.1  3 X Average Risk  23.4   11.0        Use the calculated Patient Ratio above and the CHD Risk Table to determine the patient's CHD Risk.        ATP III CLASSIFICATION (LDL):  <100     mg/dL   Optimal  100-129  mg/dL   Near or Above                    Optimal  130-159  mg/dL   Borderline  160-189  mg/dL   High  >190     mg/dL   Very High   TRIG 175 (H) 11/24/2009   No results found for: [facility]   Radiology Studies    Dg Chest 2 View  Result Date: 07/29/2016 CLINICAL DATA:  Chest pain EXAM: CHEST  2 VIEW COMPARISON:  11/17/2013 FINDINGS: Symmetric interstitial opacities with Kerley lines and vascular pedicle widening. Chronic cardiomegaly. Aortic tortuosity. No consolidation, effusion, or pneumothorax. No acute osseous  finding. IMPRESSION: Cardiomegaly and interstitial edema. Electronically Signed   By: Monte Fantasia M.D.   On: 07/29/2016 23:43    EKG & Cardiac Imaging    EKG: Atrial fibrillation at 135 beats per minute with nonspecific ST/T wave abnormalities.   Echocardiogram 01/15/13:  Study Conclusions - Left ventricle: Systolic function was normal. The estimated ejection fraction was in the range of 55% to 65%. Probable mild hypokinesis of the inferior myocardium. Doppler parameters are consistent with abnormal left ventricular relaxation (grade 1 diastolic dysfunction). - Aortic valve: Trivial regurgitation. - Mitral valve: Valve area by pressure half-time: 2.5cm^2. - Left atrium: The atrium was mildly dilated. - Atrial septum: No defect or patent foramen ovale was identified.  Assessment & Plan    70 year old female with past medical history of coronary artery disease, arthritis, GERD, and OSA presented to the emergency department with complaints of chest pain and shortness of breath found to be in atrial fibrillation with RVR.   1. Atrial Fibrillation with Rapid Ventricular Response -Heart rate better controlled on diltiazem gtt. Will continue with diltiazem gtt for now. If heart rate is still controlled in the morning, we will consider switching to PO Cardizem. The patient does have a CHA2DS2-VASc score of 3 and a HAS BLED score of 1 which would make her an ideal candidate for systemic anticoagulation for CVA prophylaxis.  -Check 2D echo; if atrial fibrillation is non-valvular would consider DOAC. TEE cardioversion can be considered.   2. Hypertension  -Will continue valsartan-HCTZ for now and monitor BP while on diltiazem gtt.   3. CAD s/p PCTA  -Continue ASA and statin. -Check cardiac enzymes x 3 as chest pain may of been rate related.   4. Gout -Continue outpatient medications  5. Hypothyroidism -Continue synthroid   6. DVT prophylaxis       Signed,  Ernest Haber  DO, [place] Cell: [phone] 07/30/2016, 12:39 AM

## 2016-07-31 ENCOUNTER — Observation Stay (HOSPITAL_COMMUNITY): Payer: PPO

## 2016-07-31 DIAGNOSIS — I252 Old myocardial infarction: Secondary | ICD-10-CM | POA: Diagnosis not present

## 2016-07-31 DIAGNOSIS — I4891 Unspecified atrial fibrillation: Secondary | ICD-10-CM | POA: Diagnosis not present

## 2016-07-31 DIAGNOSIS — I10 Essential (primary) hypertension: Secondary | ICD-10-CM | POA: Diagnosis not present

## 2016-07-31 DIAGNOSIS — G4733 Obstructive sleep apnea (adult) (pediatric): Secondary | ICD-10-CM | POA: Diagnosis not present

## 2016-07-31 DIAGNOSIS — Z7982 Long term (current) use of aspirin: Secondary | ICD-10-CM | POA: Diagnosis not present

## 2016-07-31 DIAGNOSIS — M109 Gout, unspecified: Secondary | ICD-10-CM | POA: Diagnosis not present

## 2016-07-31 DIAGNOSIS — E890 Postprocedural hypothyroidism: Secondary | ICD-10-CM | POA: Diagnosis not present

## 2016-07-31 DIAGNOSIS — K219 Gastro-esophageal reflux disease without esophagitis: Secondary | ICD-10-CM | POA: Diagnosis not present

## 2016-07-31 DIAGNOSIS — M069 Rheumatoid arthritis, unspecified: Secondary | ICD-10-CM | POA: Diagnosis not present

## 2016-07-31 DIAGNOSIS — Z9861 Coronary angioplasty status: Secondary | ICD-10-CM | POA: Diagnosis not present

## 2016-07-31 DIAGNOSIS — I2510 Atherosclerotic heart disease of native coronary artery without angina pectoris: Secondary | ICD-10-CM | POA: Diagnosis not present

## 2016-07-31 DIAGNOSIS — E785 Hyperlipidemia, unspecified: Secondary | ICD-10-CM | POA: Diagnosis not present

## 2016-07-31 DIAGNOSIS — I480 Paroxysmal atrial fibrillation: Secondary | ICD-10-CM | POA: Diagnosis not present

## 2016-07-31 LAB — CBC
HEMATOCRIT: 42 % (ref 36.0–46.0)
HEMOGLOBIN: 13.5 g/dL (ref 12.0–15.0)
MCH: 29.2 pg (ref 26.0–34.0)
MCHC: 32.1 g/dL (ref 30.0–36.0)
MCV: 90.7 fL (ref 78.0–100.0)
Platelets: 249 10*3/uL (ref 150–400)
RBC: 4.63 MIL/uL (ref 3.87–5.11)
RDW: 14.6 % (ref 11.5–15.5)
WBC: 4.8 10*3/uL (ref 4.0–10.5)

## 2016-07-31 LAB — ECHOCARDIOGRAM COMPLETE
Height: 65 in
Weight: 3540.8 oz

## 2016-07-31 MED ORDER — SODIUM CHLORIDE 0.9 % IV SOLN
INTRAVENOUS | Status: DC
  Administered 2016-07-31: 21:00:00 via INTRAVENOUS

## 2016-07-31 NOTE — Progress Notes (Signed)
Patient Name: Claudia Cantrell Date of Encounter: 07/31/2016  Hospital Problem List     Active Problems:   Atrial fibrillation [facility])    Subjective   Still with DOE  Inpatient Medications    . apixaban  5 mg Oral BID  . cholecalciferol  2,000 Units Oral Daily  . diltiazem  60 mg Oral Q6H  . febuxostat  80 mg Oral Daily  . levothyroxine  125 mcg Oral QAC breakfast  . rosuvastatin  20 mg Oral Daily    Vital Signs    Vitals:   07/30/16 2209 07/31/16 0504 07/31/16 0747 07/31/16 1200  BP: 117/74 106/73 106/65 107/76  Pulse: 85 86 75 70  Resp: 18 18 18 18   Temp: 98.6 F (37 C) 98.6 F (37 C) 98.4 F (36.9 C) 98.5 F (36.9 C)  TempSrc: Oral Oral Oral Oral  SpO2: 95% 97% 97% 95%  Weight:  221 lb 4.8 oz (100.4 kg)    Height:        Intake/Output Summary (Last 24 hours) at 07/31/16 1233 Last data filed at 07/31/16 [id number]  Gross per 24 hour  Intake              720 ml  Output                0 ml  Net              720 ml   Filed Weights   07/30/16 0306 07/31/16 0504  Weight: 222 lb 6.4 oz (100.9 kg) 221 lb 4.8 oz (100.4 kg)    Physical Exam    General: Pleasant obese AA female, NAD. Neuro: Alert and oriented X 3. Moves all extremities spontaneously. Psych: Normal affect. HEENT:  Normal  Neck: Supple Lungs:  CTA. Heart: Irregularly  Abdomen: Soft, non-tender, non-distended Extremities: No edema.   Labs    CBC  Recent Labs  07/30/16 0550 07/31/16 0338  WBC 5.3 4.8  HGB 13.6 13.5  HCT 44.1 42.0  MCV 91.5 90.7  PLT 245 0000000   Basic Metabolic Panel  Recent Labs  07/29/16 2045 07/30/16 0550  NA 138 140  K 3.5 3.5  CL 107 107  CO2 21* 24  GLUCOSE 119* 98  BUN 14 12  CREATININE 0.96 0.77  CALCIUM 9.4 9.1   Cardiac Enzymes  Recent Labs  07/30/16 0126 07/30/16 0550 07/30/16 1633  TROPONINI <0.03 <0.03 <0.03   Fasting Lipid Panel  Recent Labs  07/30/16 0550  CHOL 138  HDL 31*  LDLCALC 81  TRIG 131  CHOLHDL 4.5   Thyroid Function  Tests  Recent Labs  07/30/16 1633  TSH 1.313    Telemetry    A-fib, rate better controlled (39s)  Radiology     Assessment & Plan    70 yo female with PMH of CAD s/p PTCA with Dr. Darci Current) and 2011 with Dr. Claiborne Billings with nonobstuctive CAD/HTN/HLD/OSA/Obesity and hypothyroidism with multinodular goiter who presented to the ED with reports of chest pain and found to be in a-fib RVR.   1. Atrial Fibrillation with RVR: patient remains in atrial fibrillation. Continue Cardizem for rate control. She remains symptomatic. Continue apixaban. We will try and arrange TEE guided cardioversion for tomorrow. Note LV function is mildly reduced. Question related to atrial fibrillation/tachycardia. We'll plan a stress nuclear study as an outpatient. Note her enzymes are negative.  2. Hypertension:  -continue cardizem  3. CAD s/p PCTA  -Continue statin. No aspirin given need for anticoagulation.  4. Hypothyroidism - TSH normal  5. DVT prophylaxis   Signed, Kirk Ruths, MD

## 2016-07-31 NOTE — Progress Notes (Signed)
Nurse notified by telemetry tech that patient's T wave was elevated. Patient assessed and asymptomatic, denying an chest pains. Stat EKG done and Pascal Lux with cardiology notified.

## 2016-07-31 NOTE — Progress Notes (Signed)
  Echocardiogram 2D Echocardiogram has been performed.  Claudia Cantrell 07/31/2016, 11:22 AM

## 2016-07-31 NOTE — Discharge Instructions (Signed)

## 2016-08-01 ENCOUNTER — Encounter (HOSPITAL_COMMUNITY)
Admission: EM | Disposition: A | Discharge status: Home / Self Care | Payer: Self-pay | Source: Home / Self Care | Attending: Internal Medicine

## 2016-08-01 ENCOUNTER — Inpatient Hospital Stay (HOSPITAL_COMMUNITY): Payer: PPO | Admitting: Certified Registered Nurse Anesthetist

## 2016-08-01 ENCOUNTER — Encounter (HOSPITAL_COMMUNITY): Payer: Self-pay | Admitting: Certified Registered Nurse Anesthetist

## 2016-08-01 ENCOUNTER — Inpatient Hospital Stay (HOSPITAL_COMMUNITY): Payer: PPO

## 2016-08-01 DIAGNOSIS — G4733 Obstructive sleep apnea (adult) (pediatric): Secondary | ICD-10-CM | POA: Diagnosis not present

## 2016-08-01 DIAGNOSIS — M069 Rheumatoid arthritis, unspecified: Secondary | ICD-10-CM | POA: Diagnosis not present

## 2016-08-01 DIAGNOSIS — I10 Essential (primary) hypertension: Secondary | ICD-10-CM | POA: Diagnosis not present

## 2016-08-01 DIAGNOSIS — I2510 Atherosclerotic heart disease of native coronary artery without angina pectoris: Secondary | ICD-10-CM

## 2016-08-01 DIAGNOSIS — Z9861 Coronary angioplasty status: Secondary | ICD-10-CM | POA: Diagnosis not present

## 2016-08-01 DIAGNOSIS — I4891 Unspecified atrial fibrillation: Secondary | ICD-10-CM | POA: Diagnosis not present

## 2016-08-01 DIAGNOSIS — E785 Hyperlipidemia, unspecified: Secondary | ICD-10-CM | POA: Diagnosis not present

## 2016-08-01 DIAGNOSIS — M109 Gout, unspecified: Secondary | ICD-10-CM | POA: Diagnosis not present

## 2016-08-01 DIAGNOSIS — I480 Paroxysmal atrial fibrillation: Secondary | ICD-10-CM | POA: Diagnosis not present

## 2016-08-01 DIAGNOSIS — I252 Old myocardial infarction: Secondary | ICD-10-CM | POA: Diagnosis not present

## 2016-08-01 DIAGNOSIS — I340 Nonrheumatic mitral (valve) insufficiency: Secondary | ICD-10-CM

## 2016-08-01 DIAGNOSIS — E890 Postprocedural hypothyroidism: Secondary | ICD-10-CM | POA: Diagnosis not present

## 2016-08-01 DIAGNOSIS — Z7982 Long term (current) use of aspirin: Secondary | ICD-10-CM | POA: Diagnosis not present

## 2016-08-01 DIAGNOSIS — K219 Gastro-esophageal reflux disease without esophagitis: Secondary | ICD-10-CM | POA: Diagnosis not present

## 2016-08-01 HISTORY — PX: CARDIOVERSION: SHX1299

## 2016-08-01 HISTORY — PX: TEE WITHOUT CARDIOVERSION: SHX5443

## 2016-08-01 LAB — CBC
HCT: 44.9 % (ref 36.0–46.0)
Hemoglobin: 14.6 g/dL (ref 12.0–15.0)
MCH: 29.3 pg (ref 26.0–34.0)
MCHC: 32.5 g/dL (ref 30.0–36.0)
MCV: 90.2 fL (ref 78.0–100.0)
PLATELETS: 245 10*3/uL (ref 150–400)
RBC: 4.98 MIL/uL (ref 3.87–5.11)
RDW: 14.6 % (ref 11.5–15.5)
WBC: 4 10*3/uL (ref 4.0–10.5)

## 2016-08-01 SURGERY — CARDIOVERSION
Anesthesia: Monitor Anesthesia Care

## 2016-08-01 MED ORDER — PROPOFOL 500 MG/50ML IV EMUL
INTRAVENOUS | Status: DC | PRN
  Administered 2016-08-01: 75 ug/kg/min via INTRAVENOUS

## 2016-08-01 MED ORDER — BUTAMBEN-TETRACAINE-BENZOCAINE 2-2-14 % EX AERO
INHALATION_SPRAY | CUTANEOUS | Status: DC | PRN
  Administered 2016-08-01: 1 via TOPICAL

## 2016-08-01 MED ORDER — PERFLUTREN LIPID MICROSPHERE
INTRAVENOUS | Status: AC
  Filled 2016-08-01: qty 10

## 2016-08-01 MED ORDER — PROPOFOL 10 MG/ML IV BOLUS
INTRAVENOUS | Status: AC
  Filled 2016-08-01: qty 20

## 2016-08-01 MED ORDER — DILTIAZEM HCL ER COATED BEADS 180 MG PO CP24
180.0000 mg | ORAL_CAPSULE | Freq: Every day | ORAL | Status: DC
  Administered 2016-08-01 – 2016-08-02 (×2): 180 mg via ORAL
  Filled 2016-08-01 (×2): qty 1

## 2016-08-01 MED ORDER — PERFLUTREN LIPID MICROSPHERE
INTRAVENOUS | Status: DC | PRN
  Administered 2016-08-01: 2 mL via INTRAVENOUS

## 2016-08-01 MED ORDER — LACTATED RINGERS IV SOLN
INTRAVENOUS | Status: DC
  Administered 2016-08-01 (×3): via INTRAVENOUS

## 2016-08-01 NOTE — Transfer of Care (Signed)
Immediate Anesthesia Transfer of Care Note  Patient: Claudia Cantrell  Procedure(s) Performed: Procedure(s): CARDIOVERSION (N/A) TRANSESOPHAGEAL ECHOCARDIOGRAM (TEE) (N/A)  Patient Location: Endoscopy Unit  Anesthesia Type:MAC  Level of Consciousness: awake, alert , oriented and patient cooperative  Airway & Oxygen Therapy: Patient Spontanous Breathing and Patient connected to nasal cannula oxygen  Post-op Assessment: Report given to RN, Post -op Vital signs reviewed and stable and Patient moving all extremities X 4  Post vital signs: Reviewed and stable  Last Vitals:  Vitals:   08/01/16 0922 08/01/16 1053  BP: (!) 145/97 117/71  Pulse: 83 (!) 58  Resp: 18 20  Temp:      Last Pain:  Vitals:   08/01/16 0624  TempSrc: Oral  PainSc:          Complications: No apparent anesthesia complications

## 2016-08-01 NOTE — Progress Notes (Signed)
*   Echocardiogram Echocardiogram Transesophageal with Definity has been performed.  Bobbye Charleston 08/01/2016, 10:55 AM

## 2016-08-01 NOTE — Anesthesia Preprocedure Evaluation (Addendum)
Anesthesia Evaluation  Patient identified by MRN, date of birth, ID band Patient awake    Reviewed: Allergy & Precautions, NPO status , Patient's Chart, lab work & pertinent test results  Airway Mallampati: III  TM Distance: >3 FB Neck ROM: Full    Dental  (+) Partial Upper, Partial Lower, Dental Advisory Given   Pulmonary shortness of breath and with exertion, sleep apnea and Continuous Positive Airway Pressure Ventilation , former smoker,    breath sounds clear to auscultation       Cardiovascular Exercise Tolerance: Poor hypertension, Pt. on medications + angina + CAD and + Past MI  + dysrhythmias Atrial Fibrillation  Rhythm:Irregular Rate:Abnormal  ECHO  07/31/2016  Compared to a prior echo in 2014, the LVEF is lower at 40-45%  with inferior akinesis, moderate LAE and mild RAE. A-fib is  present.   Neuro/Psych    GI/Hepatic GERD  Medicated and Controlled,  Endo/Other  Hypothyroidism Morbid obesity  Renal/GU      Musculoskeletal  (+) Arthritis , Rheumatoid disorders,    Abdominal   Peds  Hematology   Anesthesia Other Findings   Reproductive/Obstetrics                           Anesthesia Physical Anesthesia Plan  ASA: III  Anesthesia Plan: MAC   Post-op Pain Management:    Induction:   Airway Management Planned: Nasal Cannula  Additional Equipment:   Intra-op Plan:   Post-operative Plan:   Informed Consent:   Dental advisory given  Plan Discussed with: CRNA  Anesthesia Plan Comments:        Anesthesia Quick Evaluation

## 2016-08-01 NOTE — CV Procedure (Signed)
See full TEE report in [employer] Pt sedated by anesthesia with diprovan 600 mg IV total. No LAA thrombus Pt subsequently had successful DCCV with 120 J to sinus rhythm. Continue apixaban. Kirk Ruths, MD

## 2016-08-01 NOTE — Progress Notes (Addendum)
Patient Name: Claudia Cantrell Date of Encounter: 08/01/2016  Hospital Problem List     Active Problems:   Atrial fibrillation [facility])    Subjective   Dyspnea and chest pain improved this AM  Inpatient Medications    . [MAR Hold] apixaban  5 mg Oral BID  . [MAR Hold] cholecalciferol  2,000 Units Oral Daily  . [MAR Hold] diltiazem  60 mg Oral Q6H  . [MAR Hold] febuxostat  80 mg Oral Daily  . [MAR Hold] levothyroxine  125 mcg Oral QAC breakfast  . [MAR Hold] rosuvastatin  20 mg Oral Daily    Vital Signs    Vitals:   07/31/16 1527 07/31/16 1958 08/01/16 0624 08/01/16 0922  BP: 121/81 124/76 114/66 (!) 145/97  Pulse: 89 92 78 83  Resp: 16 18 18 18   Temp: 98.4 F (36.9 C) 98.7 F (37.1 C) 98.1 F (36.7 C)   TempSrc: Oral Oral Oral   SpO2: 95% 94% 94% 97%  Weight:   220 lb 1.6 oz (99.8 kg)   Height:        Intake/Output Summary (Last 24 hours) at 08/01/16 0957 Last data filed at 08/01/16 0919  Gross per 24 hour  Intake           657.67 ml  Output                0 ml  Net           657.67 ml   Filed Weights   07/30/16 0306 07/31/16 0504 08/01/16 0624  Weight: 222 lb 6.4 oz (100.9 kg) 221 lb 4.8 oz (100.4 kg) 220 lb 1.6 oz (99.8 kg)    Physical Exam    General: Pleasant obese AA female, NAD. Neuro: Alert and oriented X 3. Moves all extremities spontaneously. Psych: Normal affect. HEENT:  Normal  Neck: Supple Lungs:  CTA. Heart: Irregular Abdomen: Soft, non-tender, non-distended Extremities: No edema.   Labs    CBC  Recent Labs  07/31/16 0338 08/01/16 0313  WBC 4.8 4.0  HGB 13.5 14.6  HCT 42.0 44.9  MCV 90.7 90.2  PLT 249 [id number]   Basic Metabolic Panel  Recent Labs  07/29/16 2045 07/30/16 0550  NA 138 140  K 3.5 3.5  CL 107 107  CO2 21* 24  GLUCOSE 119* 98  BUN 14 12  CREATININE 0.96 0.77  CALCIUM 9.4 9.1   Cardiac Enzymes  Recent Labs  07/30/16 0126 07/30/16 0550 07/30/16 1633  TROPONINI <0.03 <0.03 <0.03   Fasting Lipid  Panel  Recent Labs  07/30/16 0550  CHOL 138  HDL 31*  LDLCALC 81  TRIG 131  CHOLHDL 4.5   Thyroid Function Tests  Recent Labs  07/30/16 1633  TSH 1.313     Radiology     Assessment & Plan    70 yo female with PMH of CAD s/p PTCA with Dr. Darci Current) and 2011 with Dr. Claiborne Billings with nonobstuctive CAD/HTN/HLD/OSA/Obesity and hypothyroidism with multinodular goiter who presented to the ED with reports of chest pain and found to be in a-fib RVR.   1. Atrial Fibrillation with RVR: patient remains in atrial fibrillation. Continue Cardizem for rate control but change to 180 mg CD daily. Continue apixaban. For TEE guided cardioversion today. Note LV function is mildly reduced. Question related to atrial fibrillation/tachycardia. We'll plan a stress nuclear study as an outpatient. Note her enzymes are negative.  2. Hypertension:  -continue cardizem  3. CAD s/p PCTA  -Continue statin. No  aspirin given need for anticoagulation.  4. Hypothyroidism - TSH normal  5. DVT prophylaxis  If TEE/DCCV successful, will DC later today with TOC appt one week and FU Dr Claiborne Billings 6-8 weeks  Signed, Kirk Ruths, MD

## 2016-08-01 NOTE — Anesthesia Postprocedure Evaluation (Signed)
Anesthesia Post Note  Patient: Claudia Cantrell  Procedure(s) Performed: Procedure(s) (LRB): CARDIOVERSION (N/A) TRANSESOPHAGEAL ECHOCARDIOGRAM (TEE) (N/A)  Patient location during evaluation: PACU Anesthesia Type: MAC Level of consciousness: awake and alert Pain management: pain level controlled Vital Signs Assessment: post-procedure vital signs reviewed and stable Respiratory status: spontaneous breathing, nonlabored ventilation, respiratory function stable and patient connected to nasal cannula oxygen Cardiovascular status: stable and blood pressure returned to baseline Anesthetic complications: no    Last Vitals:  Vitals:   08/01/16 1110 08/01/16 1212  BP: (!) 124/95 120/70  Pulse: (!) 55 (!) 58  Resp: 15 20  Temp:  36.5 C    Last Pain:  Vitals:   08/01/16 1212  TempSrc: Oral  PainSc:                  Effie Berkshire

## 2016-08-02 ENCOUNTER — Encounter (HOSPITAL_COMMUNITY): Payer: Self-pay | Admitting: Cardiology

## 2016-08-02 ENCOUNTER — Telehealth: Payer: Self-pay | Admitting: Cardiovascular Disease

## 2016-08-02 ENCOUNTER — Other Ambulatory Visit: Payer: Self-pay | Admitting: Cardiology

## 2016-08-02 DIAGNOSIS — R079 Chest pain, unspecified: Secondary | ICD-10-CM

## 2016-08-02 DIAGNOSIS — I480 Paroxysmal atrial fibrillation: Secondary | ICD-10-CM | POA: Diagnosis not present

## 2016-08-02 LAB — CBC
HEMATOCRIT: 44.3 % (ref 36.0–46.0)
HEMOGLOBIN: 14.2 g/dL (ref 12.0–15.0)
MCH: 29.2 pg (ref 26.0–34.0)
MCHC: 32.1 g/dL (ref 30.0–36.0)
MCV: 91 fL (ref 78.0–100.0)
Platelets: 263 10*3/uL (ref 150–400)
RBC: 4.87 MIL/uL (ref 3.87–5.11)
RDW: 14.8 % (ref 11.5–15.5)
WBC: 4.5 10*3/uL (ref 4.0–10.5)

## 2016-08-02 MED ORDER — METOPROLOL SUCCINATE ER 50 MG PO TB24: 50.0000 mg | ORAL_TABLET | Freq: Every day | ORAL | 11 refills | Status: DC

## 2016-08-02 MED ORDER — APIXABAN 5 MG PO TABS: 5.0000 mg | ORAL_TABLET | Freq: Two times a day (BID) | ORAL | 10 refills | Status: DC

## 2016-08-02 MED ORDER — APIXABAN 5 MG PO TABS: 5.0000 mg | ORAL_TABLET | Freq: Two times a day (BID) | ORAL | 0 refills | Status: DC

## 2016-08-02 NOTE — Care Management Important Message (Signed)
Important Message  Patient Details  Name: Claudia Cantrell MRN: [id number] Date of Birth: 1946/09/17   Medicare Important Message Given:  Yes    Diego Delancey Montine Circle 08/02/2016, 9:54 AM

## 2016-08-02 NOTE — Progress Notes (Signed)
   Patient Name: Claudia Cantrell Date of Encounter: 08/02/2016  Hospital Problem List     Active Problems:   Atrial fibrillation [facility])    Subjective   Denies dyspnea and chest pain  Inpatient Medications    . apixaban  5 mg Oral BID  . cholecalciferol  2,000 Units Oral Daily  . diltiazem  180 mg Oral Daily  . febuxostat  80 mg Oral Daily  . levothyroxine  125 mcg Oral QAC breakfast  . rosuvastatin  20 mg Oral Daily    Vital Signs    Vitals:   08/01/16 1602 08/01/16 1956 08/02/16 0704 08/02/16 0951  BP: 125/63 (!) 111/99 121/62 113/68  Pulse:  62 (!) 57 (!) 57  Resp:  20 20   Temp:  98.4 F (36.9 C) 98.2 F (36.8 C)   TempSrc:  Oral Oral   SpO2:  97% 95%   Weight:   221 lb 1.6 oz (100.3 kg)   Height:        Intake/Output Summary (Last 24 hours) at 08/02/16 1011 Last data filed at 08/02/16 0851  Gross per 24 hour  Intake          1480.67 ml  Output             1200 ml  Net           280.67 ml   Filed Weights   07/31/16 0504 08/01/16 0624 08/02/16 0704  Weight: 221 lb 4.8 oz (100.4 kg) 220 lb 1.6 oz (99.8 kg) 221 lb 1.6 oz (100.3 kg)    Physical Exam    General: Pleasant obese AA female, NAD. Neuro: Alert and oriented X 3. Moves all extremities spontaneously. Psych: Normal affect. HEENT:  Normal  Neck: Supple Lungs:  CTA. Heart: RRR Abdomen: Soft, non-tender, non-distended Extremities: No edema.   Labs    CBC  Recent Labs  08/01/16 0313 08/02/16 0603  WBC 4.0 4.5  HGB 14.6 14.2  HCT 44.9 44.3  MCV 90.2 91.0  PLT 245 263   Cardiac Enzymes  Recent Labs  07/30/16 1633  TROPONINI <0.03   Thyroid Function Tests  Recent Labs  07/30/16 1633  TSH 1.313     Radiology     Assessment & Plan    70 yo female with PMH of CAD s/p PTCA with Dr. Darci Current) and 2011 with Dr. Claiborne Billings with nonobstuctive CAD/HTN/HLD/OSA/Obesity and hypothyroidism with multinodular goiter who presented to the ED with reports of chest pain and found to be in a-fib  RVR.   1. Atrial Fibrillation with RVR: patient remains in sinus s/p DCCV. Given mildly reduced LV function will DC cardizem and treat with toprol 50 mg daily beginning tomorrow. Continue apixaban. Note LV function is mildly reduced. Question related to atrial fibrillation/tachycardia. We'll plan a stress nuclear study as an outpatient in 2 weeks. Note her enzymes are negative.  2. Hypertension:  -change cardizem to toprol 50 mg daily; amlodipine and diovan HCT DCed.  3. CAD s/p PCTA  -Continue statin. No aspirin given need for anticoagulation.  4. Hypothyroidism - TSH normal  DC today and TOC appt one week and fu with Dr Claiborne Billings 4-6 weeks > 30 min PA and physician time D2  Signed, Kirk Ruths, MD

## 2016-08-02 NOTE — Telephone Encounter (Signed)
New message     TOC appt on 8.30.2017  With Ignacia Bayley

## 2016-08-02 NOTE — Progress Notes (Signed)
Eliquis coupon card given to the patient with instruction of usage. Claudia Cantrell [facility] [phone]

## 2016-08-02 NOTE — Discharge Summary (Signed)
Discharge Summary    Patient ID: Claudia Cantrell,  MRN: [id number], DOB/AGE: 68/05/1946 68 y.o.  Admit date: 07/29/2016 Discharge date: 08/02/2016  Primary Care Provider: Patricia Nettle Primary Cardiologist: [facility]  Discharge Diagnoses    Active Problems:   Atrial fibrillation [facility])   Chest pain with moderate risk for cardiac etiology   CAD in native artery   Postsurgical hypothyroidism   Allergies Allergies  Allergen Reactions  . Codeine Anxiety    Diagnostic Studies/Procedures    2D echo: 07/31/2016  Study Conclusions  - Left ventricle: The cavity size was normal. Wall thickness was   increased in a pattern of mild LVH. Systolic function was mildly   to moderately reduced. The estimated ejection fraction was in the   range of 40% to 45%. Inferior akinesis. The study is not   technically sufficient to allow evaluation of LV diastolic   function. - Aortic valve: Poorly visualized. There was no stenosis. There was   trivial regurgitation. - Mitral valve: Mildly thickened leaflets . There was trivial   regurgitation. - Left atrium: Moderately dilated. - Right atrium: The atrium was mildly dilated. - Inferior vena cava: The vessel was normal in size. The   respirophasic diameter changes were in the normal range (>= 50%),   consistent with normal central venous pressure.  Impressions:  - Compared to a prior echo in 2014, the LVEF is lower at 40-45%   with inferior akinesis, moderate LAE and mild RAE. A-fib is   present.  TEE/DCCV 08/01/2016: Discussed below. _____________   History of Present Illness     70 yo female with PMH of CAD s/p PTCA with Dr. Darci Current) and [place] 2011 with Dr. Claiborne Billings with nonobstuctive CAD/HTN/HLD/OSA/Obesity and hypothyroidism with multinodular goiter Who presented to the [place] to department with complaints of shortness of breath and intermittent chest pain for the past 2-3 days prior to presentation. She described the chest  pain as a sharp sensation but it was different from the previous time when she had her myocardial infarction. Stated the pain was mostly associated with exertion, with subsequent shortness of breath. Denied any nausea, vomiting, diaphoresis, or syncope. Stated that her daughter had recently bought her FitBit and she had noted that her heart rate seemed to be sporadic on the device, but she assumed that it was malfunctioning. Upon arrival to the emergency department she was found to be in new onset atrial fibrillation with rapid ventricular response. She was given a IV bolus of Cardizem and then started on a Cardizem drip, with improvement of her heart rate from the 140s down to the upper 90s. At that time her chest pain seemed to abate along with other symptoms.  Hospital Course     Consultants: None   Ms. Westmoreland was admitted to our service and continued on IV Cardizem and heparin given her chadsvasc score of 3. Her aspirin and statin was continued along with her valsartan/HCTZ. She remained in atrial fibrillation, but her rate was controlled. She did report to be symptomatic with dyspnea on exertion following day. She was transitioned to Eliquis 5 mg twice a day for anticoagulation. Her cardiac enzymes were cycled and negative 3. Even her history of thyroid dysfunction and a TSH was checked and noted to be normal.  On 8/22 she still remained symptomatic. The decision was made to arrange for TEE guided cardioversion the following day. 2-D echo noted mildly reduced LV function which was questioned to be related to atrial  fibrillation/tachycardia. Plan was made to proceed with stress nuclear study in the outpatient setting.  On 8/23 she underwent successful TEE cardioversion with return of sinus rhythm. She was observed overnight for any further arrhythmias.  On 8/24 she was seen and assessed by Dr. Stanford Breed and noted stable for discharge. She remained in sinus rhythm s/p DCCV. Given her mildly reduced LV  function her Cardizem was discontinued and changed to toprol 50 mg daily which she will start the day after discharge. She will continue with Eliquis. I have arranged for her to complete exercise stress test in 2 weeks. Also arrange for a TCM appointment and follow-up with Dr. Claiborne Billings in October. Her aspirin will be discontinued given her need for anticoagulation. We have also discontinued her amlodipine, and Diovan. She was given a Eliquis card along with her prescription on day of discharge.   _____________  Discharge Vitals Blood pressure 132/72, pulse (!) 51, temperature 97.8 F (36.6 C), temperature source Oral, resp. rate 18, height 5\' 5"  (1.651 m), weight 221 lb 1.6 oz (100.3 kg), SpO2 97 %.  Filed Weights   07/31/16 0504 08/01/16 0624 08/02/16 0704  Weight: 221 lb 4.8 oz (100.4 kg) 220 lb 1.6 oz (99.8 kg) 221 lb 1.6 oz (100.3 kg)    Labs & Radiologic Studies    CBC  Recent Labs  08/01/16 0313 08/02/16 0603  WBC 4.0 4.5  HGB 14.6 14.2  HCT 44.9 44.3  MCV 90.2 91.0  PLT 245 [id number]   Basic Metabolic Panel No results for input(s): NA, K, CL, CO2, GLUCOSE, BUN, CREATININE, CALCIUM, MG, PHOS in the last 72 hours. Liver Function Tests No results for input(s): AST, ALT, ALKPHOS, BILITOT, PROT, ALBUMIN in the last 72 hours. No results for input(s): LIPASE, AMYLASE in the last 72 hours. Cardiac Enzymes  Recent Labs  07/30/16 1633  TROPONINI <0.03   BNP Invalid input(s): POCBNP D-Dimer No results for input(s): DDIMER in the last 72 hours. Hemoglobin A1C No results for input(s): HGBA1C in the last 72 hours. Fasting Lipid Panel No results for input(s): CHOL, HDL, LDLCALC, TRIG, CHOLHDL, LDLDIRECT in the last 72 hours. Thyroid Function Tests  Recent Labs  07/30/16 1633  TSH 1.313   _____________  Dg Chest 2 View  Result Date: 07/29/2016 CLINICAL DATA:  Chest pain EXAM: CHEST  2 VIEW COMPARISON:  11/17/2013 FINDINGS: Symmetric interstitial opacities with Kerley lines and  vascular pedicle widening. Chronic cardiomegaly. Aortic tortuosity. No consolidation, effusion, or pneumothorax. No acute osseous finding. IMPRESSION: Cardiomegaly and interstitial edema. Electronically Signed   By: Monte Fantasia M.D.   On: 07/29/2016 23:43   Disposition   Pt is being discharged home today in good condition.  Follow-up Plans & Appointments    Follow-up Information    Shelva Majestic, MD Follow up on 09/19/2016.   Specialty:  Cardiology Why:  1:30 for your follow up with Dr. Claiborne Billings.  Contact information: [address] [place] [address] [phone]        Murray Hodgkins, NP Follow up on 08/08/2016.   Specialties:  Nurse Practitioner, Cardiology, Radiology Why:  11:15 for your hospital follow up with Dr. Evette Georges NP Gerald Stabs.  Contact information: [address] [place] [address] [phone]          Discharge Instructions    AMB Referral to [place] Management    Complete by:  As directed   Reason for consult:  HTA patient - new medical diagnosis, post hospital follow up  Expected date of contact:  1-3 days (reserved for hospital discharges)   Please assign to community nurse for transition of care calls and assess for home visits. Please assess for new medications with new diagnosis.  Questions please call:   Natividad Brood, RN BSN [facility] Liaison  [phone] business mobile phone Toll free office [phone]   Call MD for:  persistant dizziness or light-headedness    Complete by:  As directed   Diet - low sodium heart healthy    Complete by:  As directed   Discharge instructions    Complete by:  As directed   Please remember to stop taking both your Norvasc and Doivan. We have started you Toprol 50mg  daily, which you will start tomorrow.   Increase activity slowly    Complete by:  As directed      Discharge Medications   Current Discharge Medication List    START taking these medications    Details  apixaban (ELIQUIS) 5 MG TABS tablet Take 1 tablet (5 mg total) by mouth 2 (two) times daily. Qty: 60 tablet, Refills: 0    metoprolol succinate (TOPROL XL) 50 MG 24 hr tablet Take 1 tablet (50 mg total) by mouth daily. Take with or immediately following a meal. Qty: 30 tablet, Refills: 11      CONTINUE these medications which have NOT CHANGED   Details  Cholecalciferol (VITAMIN D-3 PO) Take 2,000 Units by mouth daily.     febuxostat (ULORIC) 40 MG tablet Take 80 mg by mouth daily.     rosuvastatin (CRESTOR) 20 MG tablet Take 20 mg by mouth daily.    SYNTHROID 125 MCG tablet TAKE ONE TABLET BY MOUTH ONCE DAILY Qty: 30 tablet, Refills: 0      STOP taking these medications     acetaminophen (TYLENOL) 500 MG tablet      amLODipine (NORVASC) 10 MG tablet      aspirin EC 325 MG tablet      valsartan-hydrochlorothiazide (DIOVAN-HCT) 320-12.5 MG per tablet           Outstanding Labs/Studies   None. Stress Test in 2 weeks.   Duration of Discharge Encounter   Greater than 30 minutes including physician time.  Signed, Reino Bellis NP-C 08/02/2016, 12:06 PM\

## 2016-08-02 NOTE — Progress Notes (Signed)
At 1446 all d/c instructions explained and given to pt.  Verbalized understanding.  D/c off floor via w/c to awaiting transport.  Tamarius Rosenfield,RN

## 2016-08-03 ENCOUNTER — Other Ambulatory Visit: Payer: Self-pay | Admitting: *Deleted

## 2016-08-03 NOTE — Patient Outreach (Addendum)
[facility]) Care Management  08/03/2016  SHAREETA SJOBLOM 1946-07-04 [id number]   Referral received from hospital liaison to initiate transition of care program once member discharged.  She was admitted to hospital on 8/20 for atrial fibrillation and discharged 8/24.  According to chart, she also has history of hypertension, coronary artery disease, obesity, obstructive sleep apnea, and hypothyroidism.  Call placed to member at listed preferred number [phone]), no answer.  HIPAA compliant voice message left, will await call back.  If no call back, will make 2nd attempt next week.  Valente David, [place], MSN [place] [phone]

## 2016-08-03 NOTE — Telephone Encounter (Signed)
Patient contacted regarding discharge from [facility] on 08/02/16.    Patient understands to follow up with provider Ignacia Bayley NP on 08/08/16 at 11:15 am at [facility] location.  Patient understands discharge instructions? yes  Patient understands medications and regiment? yes  Patient understands to bring all medications to this visit? yes   Pt reports she is doing a lot better and is trying to stick to her heart healthy diet that she was on in the hospital.  Encouraged her to continue to watch salt intake as she currently is and to call us with any questions or concerns if needed.  Pt verbalized understanding.

## 2016-08-06 ENCOUNTER — Other Ambulatory Visit: Payer: Self-pay | Admitting: *Deleted

## 2016-08-06 NOTE — Patient Outreach (Signed)
[facility]) Care Management  08/06/2016  Claudia Cantrell 20-Mar-1946 [id number]   2nd attempt made to contact member to initiate transition of care program.  Call placed to listed home number today [phone]), no answer.  Unable to leave a message as mailbox is full.  Will await call back, will make 3rd attempt within the next 3 days.  Valente David, BSN, [place] Management  [facility] Care Manager [phone]

## 2016-08-07 ENCOUNTER — Telehealth (HOSPITAL_COMMUNITY): Payer: Self-pay | Admitting: *Deleted

## 2016-08-07 NOTE — Telephone Encounter (Signed)
Close encounter 

## 2016-08-08 ENCOUNTER — Ambulatory Visit (INDEPENDENT_AMBULATORY_CARE_PROVIDER_SITE_OTHER): Payer: PPO | Admitting: Pharmacist Clinician (PhC)/ Clinical Pharmacy Specialist

## 2016-08-08 ENCOUNTER — Encounter: Payer: Self-pay | Admitting: Nurse Practitioner

## 2016-08-08 ENCOUNTER — Ambulatory Visit (INDEPENDENT_AMBULATORY_CARE_PROVIDER_SITE_OTHER): Payer: PPO | Admitting: Nurse Practitioner

## 2016-08-08 ENCOUNTER — Ambulatory Visit: Payer: PPO

## 2016-08-08 ENCOUNTER — Other Ambulatory Visit: Payer: Self-pay | Admitting: *Deleted

## 2016-08-08 VITALS — BP 122/82 | HR 53 | Ht 65.0 in | Wt 219.8 lb

## 2016-08-08 DIAGNOSIS — E785 Hyperlipidemia, unspecified: Secondary | ICD-10-CM

## 2016-08-08 DIAGNOSIS — I119 Hypertensive heart disease without heart failure: Secondary | ICD-10-CM | POA: Insufficient documentation

## 2016-08-08 DIAGNOSIS — I4891 Unspecified atrial fibrillation: Secondary | ICD-10-CM

## 2016-08-08 DIAGNOSIS — I480 Paroxysmal atrial fibrillation: Secondary | ICD-10-CM

## 2016-08-08 DIAGNOSIS — I2510 Atherosclerotic heart disease of native coronary artery without angina pectoris: Secondary | ICD-10-CM | POA: Insufficient documentation

## 2016-08-08 DIAGNOSIS — I110 Hypertensive heart disease with heart failure: Secondary | ICD-10-CM | POA: Diagnosis not present

## 2016-08-08 DIAGNOSIS — I429 Cardiomyopathy, unspecified: Secondary | ICD-10-CM

## 2016-08-08 DIAGNOSIS — I428 Other cardiomyopathies: Secondary | ICD-10-CM

## 2016-08-08 DIAGNOSIS — I10 Essential (primary) hypertension: Secondary | ICD-10-CM | POA: Insufficient documentation

## 2016-08-08 DIAGNOSIS — Z9861 Coronary angioplasty status: Secondary | ICD-10-CM | POA: Insufficient documentation

## 2016-08-08 NOTE — Patient Outreach (Signed)
[facility]) Care Management  08/08/2016  Claudia Cantrell January 30, 1946 [id number]   3rd attempt made to contact member, this time successful.  Member verifies identity, [facility] care management services explained.  She state that she has been improving since her discharge, reports compliance with medications.  Member able to verbalize medication changes (Eliquis and Toprol dose change).  She state that she has a follow up appointment with the cardiologist today, and will have a stress test scheduled at that time.  She denies any heart palpitations, or chest discomfort.    Member is agreeable to [facility] care management services, but state that she does not feel the need for a home visit as she is able to care for herself.  She reports that she is self care, but does have family/friends that are available to assist if needed.  She provides her own transportation to/from appointments.  She does need continued education on signs/symptoms of A-fib, medication (Eliquis) side effects, and importance of monitoring heart rate/blood pressure.    She denies any further needs at this time.  Provided with contact information, advised to contact with questions.  Will follow up with member next week.    Valente David, [place], MSN [place] [phone]

## 2016-08-08 NOTE — Patient Instructions (Signed)
Medication Instructions:  Your physician recommends that you continue on your current medications as directed. Please refer to the Current Medication list given to you today.  Labwork: None ordered  Testing/Procedures: None Ordered  Follow-Up: Your physician recommends that you schedule a follow-up appointment in: 3 months with Dr Claiborne Billings  Any Other Special Instructions Will Be Listed Below (If Applicable).  NEXT APPOINTMENT:  DATE:_______________________________________  TIME:_______________________    If you need a refill on your cardiac medications before your next appointment, please call your pharmacy.

## 2016-08-08 NOTE — Progress Notes (Signed)
Pt was started on Eliquis for atrial fibrillation while hospitalized from Aug 20 to Aug 24.    Reviewed patients medication list.  Pt is not currently on any combined P-gp and strong CYP3A4 inhibitors/inducers (ketoconazole, traconazole, ritonavir, carbamazepine, phenytoin, rifampin, St. John's wort).  Reviewed labs.  SCr 0.77, Weight 221 lb, CrCl- 62.92 (using IBW).  Dose appropriate based on age, weight, and SCr.  Hgb and HCT 13.6/44.1  A full discussion of the nature of anticoagulants has been carried out.  A benefit/risk analysis has been presented to the patient, so that they understand the justification for choosing anticoagulation with Eliquis at this time.  The need for compliance is stressed.  Pt is aware to take the medication twice daily.  Side effects of potential bleeding are discussed, including unusual colored urine or stools, coughing up blood or coffee ground emesis, nose bleeds or serious fall or head trauma.  Discussed signs and symptoms of stroke. The patient should avoid any OTC items containing aspirin or ibuprofen.  Avoid alcohol consumption.   Call if any signs of abnormal bleeding.  Discussed financial obligations and resolved any difficulty in obtaining medication.     Also reviewed diagnosis of AF, reason for taking metoprolol succinate.    Answered all patient questions prior to her being seen by Ignacia Bayley PA

## 2016-08-08 NOTE — Progress Notes (Signed)
Office Visit    Patient Name: Claudia Cantrell Date of Encounter: 08/08/2016  Primary Care Provider:  Patricia Nettle, MD Primary Cardiologist:  Corky Downs, MD   Chief Complaint    70 year old female with prior history of CAD, hypertension, hyperlipidemia, and sleep apnea, who was recently admitted secondary to A. fib with RVR.  Past Medical History    Past Medical History:  Diagnosis Date  . Arthritis    rheumatoid  . CAD (coronary artery disease)    a. 1992 s/p MI and PTCA of unknown vessel;  b. 09/2010 Cath: LM nl, LAD 20p, LCX 19m, RCA 56m, RPL 20.  . Cancer ([place])   . Family history of anesthesia complication    daughter has difficulty waking   . GERD (gastroesophageal reflux disease)    occ  . Hyperlipidemia   . Hypertensive heart disease   . Nonischemic cardiomyopathy ([place])    a. 07/2016 Echo: EF 40-45%, mild LVH, inferior akinesis, moderately dilated left atrium, trivial AI and MR.  Marland Kitchen PAF (paroxysmal atrial fibrillation) ([place])    a. 07/2016 Admitted w/ AF RVR-->CHA2DS2VASc = 5-->Eliquis;  b. 07/2016 successful TEE/DCCV.  Marland Kitchen Sleep apnea    a. Using CPAP.   Past Surgical History:  Procedure Laterality Date  . ABDOMINAL HYSTERECTOMY  1988  . BACK SURGERY  1994  . CARDIAC CATHETERIZATION  1991 AND 1992   PTCA BY DR Lamount Cohen  . CARDIOVERSION N/A 08/01/2016   Procedure: CARDIOVERSION;  Surgeon: Lelon Perla, MD;  Location: [facility] ENDOSCOPY;  Service: Cardiovascular;  Laterality: N/A;  . ORIF ANKLE FRACTURE Right 04/27/2015   Procedure: OPEN REDUCTION INTERNAL FIXATION (ORIF) RIGHT BIMALLEOLAR ANKLE FRACTURE WITH SYNDESMOSIS FIXATION;  Surgeon: Leandrew Koyanagi, MD;  Location: [place];  Service: Orthopedics;  Laterality: Right;  . TEE WITHOUT CARDIOVERSION N/A 08/01/2016   Procedure: TRANSESOPHAGEAL ECHOCARDIOGRAM (TEE);  Surgeon: Lelon Perla, MD;  Location: [facility] ENDOSCOPY;  Service: Cardiovascular;  Laterality: N/A;  . THYROIDECTOMY  11/24/2013   DR Dalbert Batman  .  THYROIDECTOMY N/A 11/24/2013   Procedure: TOTAL THYROIDECTOMY;  Surgeon: Adin Hector, MD;  Location: [facility];  Service: General;  Laterality: N/A;  . TRANSTHORACIC ECHOCARDIOGRAM  01/15/2013   EF 55% TO 65%. PROBABLE MILD HYPOKINESIS OF THE INFERIOR MYOCARDIUM. GRADE 1 DIASTOLIC DYSFUNCTION. TRIAL AR.LA IS MILDLY DILATED.    Allergies  Allergies  Allergen Reactions  . Codeine Anxiety    History of Present Illness    70 year old female with a prior history of coronary artery disease status post PTCA 1992 with subsequent catheterization in 2011 revealing nonobstructive disease. She also has a history of hypertension, hyperlipidemia, obesity, and sleep apnea. She was recently admitted to [facility] with a 2 to three-day history of intermittent chest discomfort and dyspnea. She owns a fitbit and on the day of admission noted her heart rate to be in the 140s. This prompted her to present to the emergency department, where she was found to be in rapid atrial fibrillation. She was placed on IV diltiazem and heparin and subsequently switched to eliquis. Echo showed mild LV dysfunction with an EF of 40-45%. Cardiac markers were negative. She underwent successful TEE and cardioversion on August 23 and has since maintained sinus rhythm. Since her discharge, she has done well and is feeling much better. She has not had any recurrent chest discomfort, dyspnea, palpitations, fatigue. She denies PND, orthopnea, dizziness, syncope, edema, or early satiety. She has been compliant with her medications and tolerating them  well. She is scheduled for a stress test tomorrow.  Home Medications    Prior to Admission medications   Medication Sig Start Date End Date Taking? Authorizing Provider  apixaban (ELIQUIS) 5 MG TABS tablet Take 1 tablet (5 mg total) by mouth 2 (two) times daily. 08/02/16  Yes Cheryln Manly, NP  Cholecalciferol (VITAMIN D-3 PO) Take 2,000 Units by mouth daily.    Yes Historical Provider, MD   febuxostat (ULORIC) 40 MG tablet Take 80 mg by mouth daily.    Yes Historical Provider, MD  metoprolol succinate (TOPROL XL) 50 MG 24 hr tablet Take 1 tablet (50 mg total) by mouth daily. Take with or immediately following a meal. 08/02/16  Yes Cheryln Manly, NP  rosuvastatin (CRESTOR) 20 MG tablet Take 20 mg by mouth daily.   Yes Historical Provider, MD  SYNTHROID 125 MCG tablet TAKE ONE TABLET BY MOUTH ONCE DAILY 04/20/15  Yes [name], MD    Review of Systems    As above, she has been doing well.  She denies chest pain, palpitations, dyspnea, pnd, orthopnea, n, v, dizziness, syncope, edema, weight gain, or early satiety.  All other systems reviewed and are otherwise negative except as noted above.  Physical Exam    VS:  BP 122/82   Pulse (!) 53   Ht 5\' 5"  (1.651 m)   Wt 219 lb 12.8 oz (99.7 kg)   BMI 36.58 kg/m  , BMI Body mass index is 36.58 kg/m. GEN: Well nourished, well developed, in no acute distress.  HEENT: normal.  Neck: Supple, no JVD, carotid bruits, or masses. Cardiac: RRR, no murmurs, rubs, or gallops. No clubbing, cyanosis, edema.  Radials/DP/PT 2+ and equal bilaterally.  Respiratory:  Respirations regular and unlabored, clear to auscultation bilaterally. GI: Soft, nontender, nondistended, BS + x 4. MS: no deformity or atrophy. Skin: warm and dry, no rash. Neuro:  Strength and sensation are intact. Psych: Normal affect.  Accessory Clinical Findings    ECG - Sinus bradycardia, 53, low voltage, nonspecific T changes.  Assessment & Plan    1.  Paroxysmal atrial fibrillation: Patient was recently admitted with rapid A. fib, chest discomfort, and dyspnea. She ruled out and underwent successful cardioversion. She is now on beta blocker and eliquis therapy and is tolerating both. She has been seen by our pharmacist today. She has been doing well without recurrent palpitations, dyspnea, or chest pain since discharge.  2. Presumed Nonischemic cardiomyopathy: In the  setting of A. fib, EF was 40-45%. EF was previously normal in 2014. She is euvolemic on exam.  We discussed the importance of daily weights, sodium restriction, medication compliance, and symptom reporting and she verbalizes understanding.  She is scheduled for a Myoview tomorrow to rule out ischemia. She remains on beta blocker therapy.  3. Hypertensive heart disease: Blood pressure is stable on beta blocker therapy.  4. Hyperlipidemia: LDL was 81 on statin therapy.  5. Obstructive sleep apnea: She uses CPAP nightly.  6. Disposition: Follow-up Myoview stress testing as planned. Follow-up with Dr. Claiborne Billings in 3 months or sooner if necessary.   Murray Hodgkins, NP 08/08/2016, 12:44 PM

## 2016-08-08 NOTE — Patient Instructions (Signed)
Apixaban oral tablets °What is this medicine? °APIXABAN (a PIX a ban) is an anticoagulant (blood thinner). It is used to lower the chance of stroke in people with a medical condition called atrial fibrillation. It is also used to treat or prevent blood clots in the lungs or in the veins. °This medicine may be used for other purposes; ask your health care provider or pharmacist if you have questions. °What should I tell my health care provider before I take this medicine? °They need to know if you have any of these conditions: °-bleeding disorders °-bleeding in the brain °-blood in your stools (black or tarry stools) or if you have blood in your vomit °-history of stomach bleeding °-kidney disease °-liver disease °-mechanical heart valve °-an unusual or allergic reaction to apixaban, other medicines, foods, dyes, or preservatives °-pregnant or trying to get pregnant °-breast-feeding °How should I use this medicine? °Take this medicine by mouth with a glass of water. Follow the directions on the prescription label. You can take it with or without food. If it upsets your stomach, take it with food. Take your medicine at regular intervals. Do not take it more often than directed. Do not stop taking except on your doctor's advice. Stopping this medicine may increase your risk of a blot clot. Be sure to refill your prescription before you run out of medicine. °Talk to your pediatrician regarding the use of this medicine in children. Special care may be needed. °Overdosage: If you think you have taken too much of this medicine contact a poison control center or emergency room at once. °NOTE: This medicine is only for you. Do not share this medicine with others. °What if I miss a dose? °If you miss a dose, take it as soon as you can. If it is almost time for your next dose, take only that dose. Do not take double or extra doses. °What may interact with this medicine? °This medicine may interact with the following: °-aspirin  and aspirin-like medicines °-certain medicines for fungal infections like ketoconazole and itraconazole °-certain medicines for seizures like carbamazepine and phenytoin °-certain medicines that treat or prevent blood clots like warfarin, enoxaparin, and dalteparin °-clarithromycin °-NSAIDs, medicines for pain and inflammation, like ibuprofen or naproxen °-rifampin °-ritonavir °-St. John's wort °This list may not describe all possible interactions. Give your health care provider a list of all the medicines, herbs, non-prescription drugs, or dietary supplements you use. Also tell them if you smoke, drink alcohol, or use illegal drugs. Some items may interact with your medicine. °What should I watch for while using this medicine? °Notify your doctor or health care professional and seek emergency treatment if you develop breathing problems; changes in vision; chest pain; severe, sudden headache; pain, swelling, warmth in the leg; trouble speaking; sudden numbness or weakness of the face, arm, or leg. These can be signs that your condition has gotten worse. °If you are going to have surgery, tell your doctor or health care professional that you are taking this medicine. °Tell your health care professional that you use this medicine before you have a spinal or epidural procedure. Sometimes people who take this medicine have bleeding problems around the spine when they have a spinal or epidural procedure. This bleeding is very rare. If you have a spinal or epidural procedure while on this medicine, call your health care professional immediately if you have back pain, numbness or tingling (especially in your legs and feet), muscle weakness, paralysis, or loss of bladder or bowel   control. °Avoid sports and activities that might cause injury while you are using this medicine. Severe falls or injuries can cause unseen bleeding. Be careful when using sharp tools or knives. Consider using an electric razor. Take special care  brushing or flossing your teeth. Report any injuries, bruising, or red spots on the skin to your doctor or health care professional. °What side effects may I notice from receiving this medicine? °Side effects that you should report to your doctor or health care professional as soon as possible: °-allergic reactions like skin rash, itching or hives, swelling of the face, lips, or tongue °-signs and symptoms of bleeding such as bloody or black, tarry stools; red or dark-brown urine; spitting up blood or brown material that looks like coffee grounds; red spots on the skin; unusual bruising or bleeding from the eye, gums, or nose °This list may not describe all possible side effects. Call your doctor for medical advice about side effects. You may report side effects to FDA at 1-800-FDA-1088. °Where should I keep my medicine? °Keep out of the reach of children. °Store at room temperature between 20 and 25 degrees C (68 and 77 degrees F). Throw away any unused medicine after the expiration date. °NOTE: This sheet is a summary. It may not cover all possible information. If you have questions about this medicine, talk to your doctor, pharmacist, or health care provider. °  °© 2016, [employer]. (2013-07-31 11:59:24) ° °

## 2016-08-09 ENCOUNTER — Ambulatory Visit (HOSPITAL_COMMUNITY)
Admission: RE | Admit: 2016-08-09 | Discharge: 2016-08-09 | Disposition: A | Discharge status: Home / Self Care | Payer: PPO | Source: Ambulatory Visit | Attending: Cardiology | Admitting: Cardiology

## 2016-08-09 DIAGNOSIS — R0602 Shortness of breath: Secondary | ICD-10-CM | POA: Insufficient documentation

## 2016-08-09 DIAGNOSIS — G4733 Obstructive sleep apnea (adult) (pediatric): Secondary | ICD-10-CM | POA: Diagnosis not present

## 2016-08-09 DIAGNOSIS — R002 Palpitations: Secondary | ICD-10-CM | POA: Diagnosis not present

## 2016-08-09 DIAGNOSIS — Z8249 Family history of ischemic heart disease and other diseases of the circulatory system: Secondary | ICD-10-CM | POA: Insufficient documentation

## 2016-08-09 DIAGNOSIS — R9439 Abnormal result of other cardiovascular function study: Secondary | ICD-10-CM | POA: Insufficient documentation

## 2016-08-09 DIAGNOSIS — Z87891 Personal history of nicotine dependence: Secondary | ICD-10-CM | POA: Diagnosis not present

## 2016-08-09 DIAGNOSIS — R079 Chest pain, unspecified: Secondary | ICD-10-CM | POA: Insufficient documentation

## 2016-08-09 DIAGNOSIS — E669 Obesity, unspecified: Secondary | ICD-10-CM | POA: Diagnosis not present

## 2016-08-09 DIAGNOSIS — Z6836 Body mass index (BMI) 36.0-36.9, adult: Secondary | ICD-10-CM | POA: Insufficient documentation

## 2016-08-09 DIAGNOSIS — R5383 Other fatigue: Secondary | ICD-10-CM | POA: Insufficient documentation

## 2016-08-09 DIAGNOSIS — R0609 Other forms of dyspnea: Secondary | ICD-10-CM | POA: Diagnosis not present

## 2016-08-09 DIAGNOSIS — I10 Essential (primary) hypertension: Secondary | ICD-10-CM | POA: Insufficient documentation

## 2016-08-09 DIAGNOSIS — I2510 Atherosclerotic heart disease of native coronary artery without angina pectoris: Secondary | ICD-10-CM | POA: Diagnosis not present

## 2016-08-09 LAB — MYOCARDIAL PERFUSION IMAGING
CHL CUP NUCLEAR SRS: 10
CHL CUP NUCLEAR SSS: 12
LV dias vol: 100 mL (ref 46–106)
LVSYSVOL: 45 mL
NUC STRESS TID: 1.23
Peak HR: 82 {beats}/min
Rest HR: 51 {beats}/min
SDS: 2

## 2016-08-09 MED ORDER — TECHNETIUM TC 99M TETROFOSMIN IV KIT
32.0000 | PACK | Freq: Once | INTRAVENOUS | Status: AC | PRN
  Administered 2016-08-09: 32 via INTRAVENOUS
  Filled 2016-08-09: qty 32

## 2016-08-09 MED ORDER — AMINOPHYLLINE 25 MG/ML IV SOLN
75.0000 mg | Freq: Once | INTRAVENOUS | Status: AC
  Administered 2016-08-09: 75 mg via INTRAVENOUS

## 2016-08-09 MED ORDER — TECHNETIUM TC 99M TETROFOSMIN IV KIT
9.9000 | PACK | Freq: Once | INTRAVENOUS | Status: AC | PRN
  Administered 2016-08-09: 10 via INTRAVENOUS
  Filled 2016-08-09: qty 10

## 2016-08-09 MED ORDER — REGADENOSON 0.4 MG/5ML IV SOLN
0.4000 mg | Freq: Once | INTRAVENOUS | Status: AC
  Administered 2016-08-09: 0.4 mg via INTRAVENOUS

## 2016-08-09 MED ORDER — AMINOPHYLLINE 25 MG/ML IV SOLN: 32.0000 mg | Freq: Once | INTRAVENOUS | Status: DC

## 2016-08-14 ENCOUNTER — Encounter: Payer: Self-pay | Admitting: *Deleted

## 2016-08-14 ENCOUNTER — Other Ambulatory Visit: Payer: Self-pay | Admitting: *Deleted

## 2016-08-14 NOTE — Patient Outreach (Signed)
[facility]) Care Management  08/14/2016  Claudia Cantrell December 10, 1946 [id number]   Weekly transition of care call placed to member, however she state that this is not a good time to talk.  She state she will call back at her earliest convenience.  Will await call back, if no call back, will follow up next week.  Valente David, [place], MSN [place] [phone]

## 2016-08-16 ENCOUNTER — Telehealth: Payer: Self-pay | Admitting: Cardiology

## 2016-08-16 NOTE — Telephone Encounter (Signed)
Pt had stress test last Thursday,she would like the results please.

## 2016-08-16 NOTE — Telephone Encounter (Signed)
Dr. Evette Georges patient. See stress test result 8/31. Pt advised results normal. She voiced understanding and thanks.

## 2016-08-21 ENCOUNTER — Encounter: Payer: Self-pay | Admitting: *Deleted

## 2016-08-22 ENCOUNTER — Telehealth: Payer: Self-pay | Admitting: Cardiovascular Disease

## 2016-08-22 NOTE — Telephone Encounter (Signed)
Returned call. 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 "Sleep management machine" brand.   She requests Rx faxed to [facility] for replacement unit w/ humidifier   Aware I will route to Mariann Laster to address when she is able and that she is currently out of office. Pt agreeable to this and voiced thanks.

## 2016-08-22 NOTE — Telephone Encounter (Signed)
New message    Pt calling stating that she needs a CPAP replacement. Please advise.

## 2016-08-24 ENCOUNTER — Other Ambulatory Visit: Payer: Self-pay | Admitting: *Deleted

## 2016-08-24 LAB — GLUCOSE, POCT (MANUAL RESULT ENTRY): POC Glucose: 89 mg/dl (ref 70–99)

## 2016-08-24 NOTE — Patient Outreach (Signed)
[facility]) Care Management  08/24/2016  Claudia Cantrell 1946-10-06 [id number]   Weekly transition of care call placed to member, with intent to complete initial assessment.  Member state that this is not a good time to talk as she is driving and does not like to talk on the phone while driving.  She state she will call back at her earliest convenience.  Will await call back.  If no call back, will follow up next week.  Valente David, [place], MSN [place] [phone]

## 2016-08-29 ENCOUNTER — Other Ambulatory Visit: Payer: Self-pay | Admitting: *Deleted

## 2016-08-29 ENCOUNTER — Encounter: Payer: Self-pay | Admitting: *Deleted

## 2016-08-29 NOTE — Patient Outreach (Signed)
[facility]) Care Management   08/29/2016  Claudia Cantrell 01-Jan-1946 [id number]  Claudia Cantrell is an 70 y.o. female  Subjective:   Member reports that she has continued to progress well since her discharge.  She reports compliance with her medications, however she still struggle with a low sodium diet.  She verbalizes understanding of proper heart healthy diet, but state she has a hard time with the taste of low sodium food.  She state "I'm doing my best."  She state she weighs herself daily and check her heart rate with her fitbit, but does not check her blood pressure.  She report she will get a blood pressure monitor next month for additional monitoring.   Objective:   ROS  Physical Exam  Ht 1.651 m (_0 )   Wt 219 lb (99.3 kg)   BMI 36.44 kg/m   Encounter Medications:   Outpatient Encounter Prescriptions as of 08/29/2016  Medication Sig  . apixaban (ELIQUIS) 5 MG TABS tablet Take 1 tablet (5 mg total) by mouth 2 (two) times daily.  . Cholecalciferol (VITAMIN D-3 PO) Take 2,000 Units by mouth daily.   . febuxostat (ULORIC) 40 MG tablet Take 80 mg by mouth daily.   . metoprolol succinate (TOPROL XL) 50 MG 24 hr tablet Take 1 tablet (50 mg total) by mouth daily. Take with or immediately following a meal.  . rosuvastatin (CRESTOR) 20 MG tablet Take 20 mg by mouth daily.  Marland Kitchen SYNTHROID 125 MCG tablet TAKE ONE TABLET BY MOUTH ONCE DAILY   No facility-administered encounter medications on file as of 08/29/2016.     Functional Status:   In your present state of health, do you have any difficulty performing the following activities: 08/29/2016 07/30/2016  Hearing? N -  Vision? N -  Difficulty concentrating or making decisions? N -  Walking or climbing stairs? N -  Dressing or bathing? N -  Doing errands, shopping? N N  Preparing Food and eating ? N -  Using the Toilet? N -  In the past six months, have you accidently leaked urine? N -  Do you have problems with  loss of bowel control? N -  Managing your Medications? N -  Managing your Finances? N -  Housekeeping or managing your Housekeeping? N -  Some recent data might be hidden    Fall/Depression Screening:    PHQ 2/9 Scores 08/29/2016  PHQ - 2 Score 0   Fall Risk  08/29/2016  Falls in the past year? No    Assessment:    Member continues to deny need for home visit, initial assessment complete telephonically.  She denies any further concerns at this time, denies any signs/symptoms of A-fib.  Advised to contact with questions.  Plan:   Will follow up with final transition of care call next week.  If no additional needs, will close case.  [facility] CM Care Plan Problem One   Flowsheet Row Most Recent Value  Care Plan Problem One  Risk for readmission related to new onset A-fib as evidenced by recent hospitalization  Role Documenting the Problem One  Care Management [place] for Problem One  Active  THN Long Term Goal (31-90 days)  Member will not be readmitted to hospital within the next 31 days  THN Long Term Goal Start Date  08/08/16  Interventions for Problem One Long Term Goal  Discussed with member the importance of following discharge instructions, including follow up appointments, medications, diet  to decrease the risk of readmission  THN CM Short Term Goal #1 (0-30 days)  Member will keep and attend follow up appointment this week  THN CM Short Term Goal #1 Start Date  08/08/16  Interventions for Short Term Goal #1  Confirmed follow up appointment with member, confirmed with member that she has transportation.  Discussed with member the importance of keeping appointment.    THN CM Short Term Goal #2 (0-30 days)  Member will be able to verbalize 3 signs/symptoms of A-fibrillation within the next 4 weeks  THN CM Short Term Goal #2 Start Date  08/08/16  [facility] CM Short Term Goal #2 Met Date  08/29/16  Interventions for Short Term Goal #2  Member educated on signs/symptoms of  a-fib using teachback method.  THN CM Short Term Goal #3 (0-30 days)  Member will report taking blood pressure and heart rate daily over the next 4 weeks  THN CM Short Term Goal #3 Start Date  08/08/16  [facility] CM Short Term Goal #3 Met Date  08/29/16  Interventions for Short Tern Goal #3  Discussed with member the importance of daily heart rate/blood pressure checks, and how it relates to managing a-fib     Valente David, Therapist, sports, MSN [place] Manager [phone]

## 2016-08-30 ENCOUNTER — Telehealth: Payer: Self-pay | Admitting: Cardiovascular Disease

## 2016-08-30 NOTE — Telephone Encounter (Signed)
New Message  Pt call requesting to speak with RN about getting a new prescription for a cpap machine. Pt stats she turn in her original cpap machine because it was 68 years old. Please call back to discuss

## 2016-08-30 NOTE — Telephone Encounter (Signed)
Returned call to patient-pt following up on CPAP rx.  Reports she cannot get a new machine without a new rx-her old machine is not working.  Pt reports using [facility] for CPAP supplies.  Advised I would route to primary to address.  Pt verbalized understanding.

## 2016-09-03 ENCOUNTER — Other Ambulatory Visit: Payer: Self-pay | Admitting: *Deleted

## 2016-09-03 NOTE — Patient Outreach (Signed)
[facility]) Care Management  09/03/2016  Claudia Cantrell 1946-05-02 [id number]   Call placed to member in attempt to compete transition of care program, no answer.  HIPAA compliant voice message left.  Will make another attempt within the next week if no call back.  Valente David, [place], MSN [place] [phone]

## 2016-09-06 NOTE — Telephone Encounter (Signed)
Spoke to patient informed her she needs a face to face appointment -for new rx  CPAP ( original came from [facility] ) Patient states the humidifier is not working. But she is able to use machine without. Patient aware will discuss recent test, post hosp. visit,as well at appointment- 09/18/16 She will bring her machine so it can be downloaded

## 2016-09-06 NOTE — Telephone Encounter (Signed)
F/u    Pt calling back about getting a rx. Please call.

## 2016-09-07 ENCOUNTER — Other Ambulatory Visit: Payer: Self-pay | Admitting: *Deleted

## 2016-09-07 ENCOUNTER — Encounter: Payer: Self-pay | Admitting: *Deleted

## 2016-09-07 NOTE — Patient Outreach (Signed)
[facility]) Care Management  09/07/2016  DUSTYN MEREDITH 02/27/1946 [id number]   Attempt made to contact member for last transition of care call and close program, no answer.  HIPAA compliant voice message left.  Will follow up next week.  Valente David, [place], MSN [place] [phone]

## 2016-09-11 ENCOUNTER — Ambulatory Visit: Payer: Self-pay | Admitting: *Deleted

## 2016-09-14 ENCOUNTER — Other Ambulatory Visit: Payer: Self-pay | Admitting: *Deleted

## 2016-09-14 ENCOUNTER — Ambulatory Visit: Payer: Self-pay | Admitting: *Deleted

## 2016-09-14 ENCOUNTER — Encounter: Payer: Self-pay | Admitting: *Deleted

## 2016-09-14 NOTE — Patient Outreach (Signed)
[facility]) Care Management  09/14/2016  AITANNA KITZINGER 1946-12-07 [id number]   Call placed to member to complete transition of care program, no answer.  This makes the 3rd consecutive unsuccessful attempt to contact member.  Will send outreach letter, if no response in 10 days, will close case.  Valente David, [place], MSN [place] [phone]

## 2016-09-18 ENCOUNTER — Encounter: Payer: Self-pay | Admitting: Cardiovascular Disease

## 2016-09-18 ENCOUNTER — Ambulatory Visit: Payer: PPO | Admitting: Cardiovascular Disease

## 2016-09-18 ENCOUNTER — Ambulatory Visit (INDEPENDENT_AMBULATORY_CARE_PROVIDER_SITE_OTHER): Payer: PPO | Admitting: Cardiovascular Disease

## 2016-09-18 VITALS — BP 144/82 | HR 52 | Ht 65.0 in | Wt 219.2 lb

## 2016-09-18 DIAGNOSIS — I480 Paroxysmal atrial fibrillation: Secondary | ICD-10-CM

## 2016-09-18 DIAGNOSIS — I2510 Atherosclerotic heart disease of native coronary artery without angina pectoris: Secondary | ICD-10-CM | POA: Diagnosis not present

## 2016-09-18 DIAGNOSIS — Z7901 Long term (current) use of anticoagulants: Secondary | ICD-10-CM

## 2016-09-18 DIAGNOSIS — I10 Essential (primary) hypertension: Secondary | ICD-10-CM

## 2016-09-18 DIAGNOSIS — G4733 Obstructive sleep apnea (adult) (pediatric): Secondary | ICD-10-CM | POA: Diagnosis not present

## 2016-09-18 DIAGNOSIS — I110 Hypertensive heart disease with heart failure: Secondary | ICD-10-CM

## 2016-09-18 DIAGNOSIS — E7800 Pure hypercholesterolemia, unspecified: Secondary | ICD-10-CM

## 2016-09-18 NOTE — Patient Instructions (Signed)
Your physician recommends that you schedule a follow-up appointment in: January sleep clinic.

## 2016-09-19 ENCOUNTER — Ambulatory Visit: Payer: PPO | Admitting: Cardiovascular Disease

## 2016-09-19 NOTE — Progress Notes (Signed)
Patient ID: Claudia Cantrell, female   DOB: Jan 17, 1946, 70 y.o.   MRN: [id number]     HPI: Claudia Cantrell is a 70 y.o. female who presents to the office today for a 3 year follow-up cardiology evaluation.  I last saw her in October 2014.  Claudia Cantrell suffered remote myocardial infarctions in 1991 and 1992 and at that time underwent PTCA by Dr. Lamount Cohen.  She does have a history of hypertension, hyperlipidemia, obstructive sleep apnea on CPAP therapy, obesity, and hypothyroidism with multinodular goiter. In October 2011 she underwent cardiac catheterization after she had experienced episodes of chest pain. Catheterization showed preserved LV contractility although the was a very small focal area of severe hypocontractility involving the mid inferior wall as well as low posterior lateral wall in this patient status post remote myocardial infarction approximately 20 years ago treated with PTCA. At the time of her catheterization she did not have significant carotid obstructive disease and only had mild 10-20% narrowing at the ostium of the LAD, 20% irregularity in the mid AV groove circumflex, and 20% mid RCA and 20% posterior lateral irregularities. Subsequently, she has done well from a cardiovascular standpoint.  She is a history of a multinodular goiter and in 2014, underwent thyroidectomy and tolerated this well from a cardiovascular standpoint.  Since I saw her, she broke her leg in 2016.  She has noticed some increasing shortness of breath particularly when she eats chocolate.  She has been followed by Dr. Jeanann Lewandowsky, for primary care.  She developed atrial fibrillation this year and was admitted August 2017 with AF with rapid ventricular response.  With a cha2ds2vasc score of 5.  She was started on eliquis..  She underwent successful TEE guided cardioversion with restoration of sinus rhythm.  She saw [name] on 08/08/2016 for hospital follow-up and was doing well.  An echo Doppler study in  the setting of AF showed an ejection fraction of 40-45%.  She underwent a nuclear perfusion study on August 30 117, which was low risk and suggested the possibility of a small basal to mid inferolateral defect.  There was no ischemia.  She has obstructive sleep apnea and has been on CPAP therapy since 2011.  Her recent machine is broken, such that she cannot use the humidifier.  She admits to 100% compliance.  She is in need for new machine.  [facility] for her DME company.  She is unaware of a recent download and this will need to be obtained.  She presents for evaluation.   Past Medical History:  Diagnosis Date  . Arthritis    rheumatoid  . CAD (coronary artery disease)    a. 1992 s/p MI and PTCA of unknown vessel;  b. 09/2010 Cath: LM nl, LAD 20p, LCX 8m RCA 138mRPL 20.  . Cancer ([place]  . Family history of anesthesia complication    daughter has difficulty waking   . GERD (gastroesophageal reflux disease)    occ  . Hyperlipidemia   . Hypertensive heart disease   . [place]ischemic cardiomyopathy ([place]   a. 07/2016 Echo: EF 40-45%, mild LVH, inferior akinesis, moderately dilated left atrium, trivial AI and MR.  . Marland KitchenAF (paroxysmal atrial fibrillation) ([place]   a. 07/2016 Admitted w/ AF RVR-->CHA2DS2VASc = 5-->Eliquis;  b. 07/2016 successful TEE/DCCV.  . Marland Kitchenleep apnea    a. Using CPAP.    Past Surgical History:  Procedure Laterality Date  . ABDOMINAL HYSTERECTOMY  1988  .  BACK SURGERY  1994  . CARDIAC CATHETERIZATION  1991 AND 1992   PTCA BY DR Lamount Cohen  . CARDIOVERSION N/A 08/01/2016   Procedure: CARDIOVERSION;  Surgeon: Lelon Perla, MD;  Location[facility] ENDOSCOPY;  Service: Cardiovascular;  Laterality: N/A;  . ORIF ANKLE FRACTURE Right 04/27/2015   Procedure: OPEN REDUCTION INTERNAL FIXATION (ORIF) RIGHT BIMALLEOLAR ANKLE FRACTURE WITH SYNDESMOSIS FIXATION;  Surgeon: Leandrew Koyanagi, MD;  Location: [place];  Service: Orthopedics;  Laterality: Right;  . TEE WITHOUT  CARDIOVERSION N/A 08/01/2016   Procedure: TRANSESOPHAGEAL ECHOCARDIOGRAM (TEE);  Surgeon: Lelon Perla, MD;  Location[facility] ENDOSCOPY;  Service: Cardiovascular;  Laterality: N/A;  . THYROIDECTOMY  11/24/2013   DR Dalbert Batman  . THYROIDECTOMY N/A 11/24/2013   Procedure: TOTAL THYROIDECTOMY;  Surgeon: Adin Hector, MD;  Location: [place];  Service: General;  Laterality: N/A;  . TRANSTHORACIC ECHOCARDIOGRAM  01/15/2013   EF 55% TO 65%. PROBABLE MILD HYPOKINESIS OF THE INFERIOR MYOCARDIUM. GRADE 1 DIASTOLIC DYSFUNCTION. TRIAL AR.LA IS MILDLY DILATED.    Allergies  Allergen Reactions  . Codeine Anxiety    Current Outpatient Prescriptions  Medication Sig Dispense Refill  . Acetaminophen (TYLENOL EXTRA STRENGTH PO) Take 2 tablets by mouth as needed.    Marland Kitchen apixaban (ELIQUIS) 5 MG TABS tablet Take 1 tablet (5 mg total) by mouth 2 (two) times daily. 60 tablet 0  . Biotin 10000 MCG TABS Take 1 capsule by mouth daily.    . Cholecalciferol (VITAMIN D-3 PO) Take 2,000 Units by mouth daily.     . febuxostat (ULORIC) 40 MG tablet Take 80 mg by mouth daily.     . metoprolol succinate (TOPROL XL) 50 MG 24 hr tablet Take 1 tablet (50 mg total) by mouth daily. Take with or immediately following a meal. 30 tablet 11  . rosuvastatin (CRESTOR) 20 MG tablet Take 20 mg by mouth daily.    Marland Kitchen SYNTHROID 125 MCG tablet TAKE ONE TABLET BY MOUTH ONCE DAILY 30 tablet 0   No current facility-administered medications for this visit.     Social History   Social History  . Marital status: Divorced    Spouse name: N/A  . Number of children: N/A  . Years of education: N/A   Occupational History  . Not on file.   Social History Main Topics  . Smoking status: Former Smoker    Packs/day: 1.00    Years: 15.00    Types: Cigarettes    Quit date: 12/10/1990  . Smokeless tobacco: Never Used     Comment: occ alcohol  . Alcohol use Yes     Comment: very rare  . Drug use: No  . Sexual activity: Not on file   Other  Topics Concern  . Not on file   Social History Narrative  . No narrative on file    Family History  Problem Relation Age of Onset  . Heart disease Mother   . Sudden death Mother   . Diabetes Father   . Stroke Father   . Bone cancer Sister   . Sudden death Brother   . Melanoma Brother   . Heart disease Brother    Social history is notable that she is widowed and has 2 children plus one adopted child as well as 2 grandchildren. She does not routinely exercise. There is no tobacco or alcohol use.   ROS General: Negative; No fevers, chills, or night sweats;  HEENT: Negative; No changes in vision or hearing, sinus congestion, difficulty swallowing  Pulmonary: Negative; No cough, wheezing, shortness of breath, hemoptysis Cardiovascular: Negative; No chest pain, presyncope, syncope, palpitations GI: Negative; No nausea, vomiting, diarrhea, or abdominal pain GU: Negative; No dysuria, hematuria, or difficulty voiding Musculoskeletal: Negative; no myalgias, joint pain, or weakness Hematologic/Oncology: Negative; no easy bruising, bleeding Endocrine: History of multinodular goiter status post thyroidectomy Neuro: Negative; no changes in balance, headaches Skin: Negative; No rashes or skin lesions Psychiatric: Negative; No behavioral problems, depression Sleep: Positive obstructive sleep apnea.  She admits to 100% compliance.  Her machine is broken.Other comprehensive 14 point system review is negative.   PE BP (!) 144/82 (BP Location: Left Arm, Patient Position: Sitting, Cuff Size: Normal)   Pulse (!) 52   Ht _0  (1.651 m)   Wt 219 lb 3.2 oz (99.4 kg)   BMI 36.48 kg/m    Wt Readings from Last 3 Encounters:  09/18/16 219 lb 3.2 oz (99.4 kg)  08/29/16 219 lb (99.3 kg)  08/09/16 221 lb (100.2 kg)   General: Alert, oriented, no distress. Moderate obesity Skin: normal turgor, no rashes HEENT: Normocephalic, atraumatic. Pupils round and reactive; sclera anicteric;no lid lag.    Nose without nasal septal hypertrophy Mouth/Parynx benign; Mallinpatti scale 3 Neck: No JVD, no carotid briuts; Lungs: clear to ausculatation and percussion; no wheezing or rales Heart: RRR, s1 s2 normal 1/6 sem Abdomen: soft, nontender; no hepatosplenomehaly, BS+; abdominal aorta nontender and not dilated by palpation. Pulses 2+ Extremities: no clubbing cyanosis or edema, Homan's sign negative  Neurologic: grossly nonfocal Psychologic: normal affect and mood.  ECG (independently read by me): Sinus bradycardia 52 bpm.  Poor anterior R-wave progression.  Normal intervals.  October 2014 ECG: Sinus rhythm at 69 beats per minute. Normal intervals. Nonspecific T changes.  LABS:  BMP Latest Ref Rng & Units 07/30/2016 07/29/2016 04/28/2015  Glucose 65 - 99 mg/dL 98 119(H) 92  BUN 6 - 20 mg/dL _1 Creatinine 0.44 - 1.00 mg/dL 0.77 0.96 0.85  Sodium 135 - 145 mmol/L 140 138 139  Potassium 3.5 - 5.1 mmol/L 3.5 3.5 3.5  Chloride 101 - 111 mmol/L 107 107 105  CO2 22 - 32 mmol/L 24 21(L) 27  Calcium 8.9 - 10.3 mg/dL 9.1 9.4 8.2(L)   Hepatic Function Latest Ref Rng & Units 04/24/2015 11/17/2013  Total Protein 6.5 - 8.1 g/dL 8.2(H) 7.9  Albumin 3.5 - 5.0 g/dL 4.6 3.8  AST 15 - 41 U/L 28 13  ALT 14 - 54 U/L 24 11  Alk Phosphatase 38 - 126 U/L 98 98  Total Bilirubin 0.3 - 1.2 mg/dL 0.6 0.4   CBC Latest Ref Rng & Units 08/02/2016 08/01/2016 07/31/2016  WBC 4.0 - 10.5 K/uL 4.5 4.0 4.8  Hemoglobin 12.0 - 15.0 g/dL 14.2 14.6 13.5  Hematocrit 36.0 - 46.0 % 44.3 44.9 42.0  Platelets 150 - 400 K/uL 263 245 249   Lab Results  Component Value Date   MCV 91.0 08/02/2016   MCV 90.2 08/01/2016   MCV 90.7 07/31/2016   Lab Results  Component Value Date   TSH 1.313 07/30/2016    Lipid Panel     Component Value Date/Time   CHOL 138 07/30/2016 0550   TRIG 131 07/30/2016 0550   HDL 31 (L) 07/30/2016 0550   CHOLHDL 4.5 07/30/2016 0550   VLDL 26 07/30/2016 0550   LDLCALC 81 07/30/2016 0550     ASSESSMENT AND PLAN: Ms. Alany Borman is 70 year-old [place] American female who  suffered a remote myocardial infarction  approximately 24 years ago and underwent PTCA by Dr. Sherald Barge 27 years ago. . At her last catheterization in 2011 she did have a small focal wall motion abnormality in the inferior and posterior lateral wall concordant with his myocardial infarction. She has mild nonobstructive CAD. Her most recent nuclear perfusion study shows an ejection fraction of 55% and findings consistent with a small area of scar in the basal and mid inferolateral wall concordant with her left circumflex infarct.  She had recently developed atrial fibrillation in August 2017 and underwent successful TEE cardioversion.  She has been on anticoagulation since.  Denies associated bleeding.  She is tolerating eloquence 5 mg twice a day.  Her EKG today confirms sinus rhythm and she is mildly bradycardic but asymptomatic on Toprol 50 mg daily.  She has hyperlipidemia and is on Crestor 20 mg daily and denies myalgias.  She is taking Synthroid 125 g following her thyroid surgery.  She has obstructive sleep apnea.  Her humidifier broke and her machine is now functioning.  She admits to 100% compliance.  She will qualify for new machine and we will [facility].  Downloadable knee.  Obtained to verify compliance.  She does note some mild shortness of breath after eating chocolates.  I've suggested that she reduce caffeine intake.  If she gets a new machine.  I will see her in sleep clinic for follow-up evaluation within 90 days.  Time spent: 25 minutes  Troy Sine, MD, [facility]  09/19/2016 5:38 PM

## 2016-09-25 ENCOUNTER — Telehealth: Payer: Self-pay | Admitting: *Deleted

## 2016-09-25 NOTE — Telephone Encounter (Signed)
Called and notified patient that her paper chart had to be ordered so that I can get the documentation needed to do CPAP order. Once I have gotten everything needed, I will advise her the referral for a new machine has been placed. Patient voiced understanding.

## 2016-09-28 ENCOUNTER — Encounter: Payer: Self-pay | Admitting: *Deleted

## 2016-09-28 ENCOUNTER — Other Ambulatory Visit: Payer: Self-pay | Admitting: *Deleted

## 2016-09-28 NOTE — Patient Outreach (Signed)
[place] [facility]) Care Management  09/28/2016  Claudia Cantrell Sep 27, 1946 [id number]   Outreach letter sent on 10/6, no response.  Will notify care management assistant and PCP of case closure.  Valente David, [place], MSN [place] [phone]

## 2016-10-02 ENCOUNTER — Ambulatory Visit: Payer: PPO | Admitting: Rheumatology

## 2016-10-08 ENCOUNTER — Encounter: Payer: Self-pay | Admitting: *Deleted

## 2016-10-08 DIAGNOSIS — M109 Gout, unspecified: Secondary | ICD-10-CM

## 2016-10-08 HISTORY — DX: Gout, unspecified: M10.9

## 2016-10-08 NOTE — Progress Notes (Signed)
*IMAGE* Office Visit Note  Patient: Claudia Cantrell             Date of Birth: 08-11-1946           MRN: [id number]             PCP: Patricia Nettle, MD Referring: Wallene Huh, MD Visit Date: 10/09/2016 Occupation:Retired    Subjective: Pain Knees   History of Present Illness: Claudia Cantrell is a 70 y.o. female . Patient reports no flare of her gout since her last visit. She states she continues to have some pain and discomfort in her bilateral knee joints and some stiffness. She has had some problems with right ankle joint swelling since her tibia fracture area and although the swelling is decreasing. She reports some pedal edema which gets better with decreased salt intake.   She complains of increased urination, urgency but denies any burning during urination. She states she's been having some discomfort in her lower back which she does describes mostly over SI joint area.There is no history of radiculopathy.   Activities of Daily Living:  Patient reports morning stiffness for 30 minutes.   Patient Denies nocturnal pain.  Difficulty dressing/grooming: Reports Difficulty climbing stairs: Reports Difficulty getting out of chair: Reports Difficulty using hands for taps, buttons, cutlery, and/or writing: Reports   Review of Systems  Constitutional: Negative for fatigue, night sweats, weight gain, weight loss and weakness.  HENT: Negative for mouth sores, trouble swallowing, trouble swallowing, mouth dryness and nose dryness.   Eyes: Negative for pain, redness, visual disturbance and dryness.  Respiratory: Negative for cough, shortness of breath and difficulty breathing.   Cardiovascular: Negative for chest pain, palpitations, hypertension, irregular heartbeat and swelling in legs/feet.  Gastrointestinal: Negative for blood in stool, constipation and diarrhea.  Endocrine: Positive for increased urination.  Genitourinary: Negative for vaginal dryness.  Musculoskeletal: Positive for  arthralgias, joint pain and morning stiffness. Negative for joint swelling, myalgias, muscle weakness, muscle tenderness and myalgias.  Skin: Negative for color change, rash, hair loss, skin tightness, ulcers and sensitivity to sunlight.  Allergic/Immunologic: Negative for susceptible to infections.  Neurological: Negative for dizziness, memory loss and night sweats.  Hematological: Negative for swollen glands.  Psychiatric/Behavioral: Negative for depressed mood and sleep disturbance. The patient is not nervous/anxious.     PMFS History:  Patient Active Problem List   Diagnosis Date Noted  . Gout 10/08/2016  . PAF (paroxysmal atrial fibrillation) ([place])   . Hypertensive heart disease   . Hyperlipidemia   . CAD (coronary artery disease)   . Nonischemic cardiomyopathy ([place])   . Chest pain with moderate risk for cardiac etiology 08/02/2016  . Atrial fibrillation ([place]) 07/29/2016  . Closed right ankle fracture 04/24/2015  . Ankle fracture, bimalleolar, closed 04/24/2015  . 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 CAD in native artery 10/09/2013  . OSA on CPAP 10/09/2013  . Morbid (severe) obesity due to excess calories ([place]) 05/27/2013  . Essential hypertension, benign 05/27/2013  . Other and unspecified hyperlipidemia 05/27/2013    Past Medical History:  Diagnosis Date  . Arthritis    rheumatoid  . CAD (coronary artery disease)    a. 1992 s/p MI and PTCA of unknown vessel;  b. 09/2010 Cath: LM nl, LAD 20p, LCX 18m, RCA 65m, RPL 20.  . Cancer ([place])   . Family history of anesthesia complication    daughter has difficulty waking   . GERD (gastroesophageal reflux disease)    occ  . Gout 10/08/2016  .  Hyperlipidemia   . Hypertensive heart disease   . Nonischemic cardiomyopathy ([place])    a. 07/2016 Echo: EF 40-45%, mild LVH, inferior akinesis, moderately dilated left atrium, trivial AI and MR.  Marland Kitchen PAF (paroxysmal atrial fibrillation) ([place])    a. 07/2016 Admitted w/ AF  RVR-->CHA2DS2VASc = 5-->Eliquis;  b. 07/2016 successful TEE/DCCV.  Marland Kitchen Sleep apnea    a. Using CPAP.    Family History  Problem Relation Age of Onset  . Heart disease Mother   . Sudden death Mother   . Diabetes Father   . Stroke Father   . Bone cancer Sister   . Sudden death Brother   . Melanoma Brother   . Heart disease Brother    Past Surgical History:  Procedure Laterality Date  . ABDOMINAL HYSTERECTOMY  1988  . BACK SURGERY  1994  . CARDIAC CATHETERIZATION  1991 AND 1992   PTCA BY DR Lamount Cohen  . CARDIOVERSION N/A 08/01/2016   Procedure: CARDIOVERSION;  Surgeon: Lelon Perla, MD;  Location: [facility] ENDOSCOPY;  Service: Cardiovascular;  Laterality: N/A;  . ORIF ANKLE FRACTURE Right 04/27/2015   Procedure: OPEN REDUCTION INTERNAL FIXATION (ORIF) RIGHT BIMALLEOLAR ANKLE FRACTURE WITH SYNDESMOSIS FIXATION;  Surgeon: Leandrew Koyanagi, MD;  Location: [place];  Service: Orthopedics;  Laterality: Right;  . TEE WITHOUT CARDIOVERSION N/A 08/01/2016   Procedure: TRANSESOPHAGEAL ECHOCARDIOGRAM (TEE);  Surgeon: Lelon Perla, MD;  Location[facility] ENDOSCOPY;  Service: Cardiovascular;  Laterality: N/A;  . THYROIDECTOMY  11/24/2013   DR Dalbert Batman  . THYROIDECTOMY N/A 11/24/2013   Procedure: TOTAL THYROIDECTOMY;  Surgeon: Adin Hector, MD;  Location: [place];  Service: General;  Laterality: N/A;  . TRANSTHORACIC ECHOCARDIOGRAM  01/15/2013   EF 55% TO 65%. PROBABLE MILD HYPOKINESIS OF THE INFERIOR MYOCARDIUM. GRADE 1 DIASTOLIC DYSFUNCTION. TRIAL AR.LA IS MILDLY DILATED.   Social History   Social History Narrative  . No narrative on file     Objective: Vital Signs: BP (!) 177/84 (BP Location: Left Arm, Patient Position: Sitting, Cuff Size: Large)   Pulse 60   Resp 14   Ht 5\' 5"  (1.651 m)   Wt 222 lb (100.7 kg)   BMI 36.94 kg/m    Physical Exam  Constitutional: She is oriented to person, place, and time. She appears well-developed and well-nourished.  HENT:  Head: Normocephalic and  atraumatic.  Eyes: Conjunctivae and EOM are normal.  Neck: Normal range of motion.  Cardiovascular: Normal rate, regular rhythm, normal heart sounds and intact distal pulses.   Pulmonary/Chest: Effort normal and breath sounds normal.  Abdominal: Soft. Bowel sounds are normal.  Liver and spleen difficult to palpate due to body habitus.  Lymphadenopathy:    She has no cervical adenopathy.  Neurological: She is alert and oriented to person, place, and time.  Skin: Skin is warm and dry. Capillary refill takes 2 to 3 seconds.  A palpable soft rubbery nodule was noted on her left elbow which could be consistent with tophus  Psychiatric: She has a normal mood and affect. Her behavior is normal.     Musculoskeletal Exam: She has good range of motion of her C-spine and thoracic spine, some discomfort with range of motion of her lumbar spine. She has tenderness in the SI joint area. Shoulder joints although joints wrist joints with good range of motion. She has some thickening of PIP/DIP joints in her hands and her feet consistent with osteoarthritis. No synovitis was noted. Hip joints and knee joints were also good range  of motion with no warmth or swelling.  CDAI Exam: No CDAI exam completed.    Investigation: Findings:  04/11/2016 DEXA showed a T score of -0.7, 03/29/2016 CBC normal comprehensive metabolic panel normal uric acid 6.4 vitamin D 46    Imaging: No results found.  Speciality Comments: No specialty comments available.    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 Plan: Check uric acid level today.  Hyperuricemia -  Plan: Uric acid  Lower back pain: Probably muscular, she describes an SI joint area without radiculopathy. Handout for lumbar exercises will be given today  Urinary frequency and urgency: I will check UA today if abnormal she'll follow-up with her PCP.  History of juvenile rheumatoid arthritis: No recurrence of disease  Coronary artery disease she is followed up by cardiologist  Hypercholesterolemia: Monitored by PCP  Essential hypertension, benign: Her blood pressure is elevated today have advised to monitor blood pressure closely.  Morbid (severe) obesity due to excess calories ([place]): Weight loss diet and exercise was discussed today.  History of fibula fracture: The discomfort is gradually getting better.  Vitamin D deficiency - Plan: VITAMIN D 25 Hydroxy (Vit-D Deficiency, Fractures)  Medication monitoring encounter - Plan: CBC with Differential/Platelet, COMPLETE METABOLIC PANEL WITH GFR     Orders: Orders Placed This Encounter  Procedures  . CBC with Differential/Platelet  . COMPLETE METABOLIC PANEL WITH GFR  . Uric acid  . VITAMIN D 25 Hydroxy (Vit-D Deficiency, Fractures)  . Urinalysis, Routine w reflex microscopic (not at [facility])   No orders of the defined types were placed in this encounter.   Face-to-face time spent with patient was 81minutes. 50% of time was spent in counseling and coordination of care.  Follow-Up Instructions: Return for Gout.   Bo Merino, MD

## 2016-10-09 ENCOUNTER — Ambulatory Visit (INDEPENDENT_AMBULATORY_CARE_PROVIDER_SITE_OTHER): Payer: PPO | Admitting: Rheumatology

## 2016-10-09 ENCOUNTER — Encounter: Payer: Self-pay | Admitting: Rheumatology

## 2016-10-09 VITALS — BP 177/84 | HR 60 | Resp 14 | Ht 65.0 in | Wt 222.0 lb

## 2016-10-09 DIAGNOSIS — E7800 Pure hypercholesterolemia, unspecified: Secondary | ICD-10-CM

## 2016-10-09 DIAGNOSIS — E790 Hyperuricemia without signs of inflammatory arthritis and tophaceous disease: Secondary | ICD-10-CM | POA: Diagnosis not present

## 2016-10-09 DIAGNOSIS — Z8781 Personal history of (healed) traumatic fracture: Secondary | ICD-10-CM | POA: Diagnosis not present

## 2016-10-09 DIAGNOSIS — Z8739 Personal history of other diseases of the musculoskeletal system and connective tissue: Secondary | ICD-10-CM

## 2016-10-09 DIAGNOSIS — E559 Vitamin D deficiency, unspecified: Secondary | ICD-10-CM | POA: Diagnosis not present

## 2016-10-09 DIAGNOSIS — R300 Dysuria: Secondary | ICD-10-CM | POA: Diagnosis not present

## 2016-10-09 DIAGNOSIS — I2510 Atherosclerotic heart disease of native coronary artery without angina pectoris: Secondary | ICD-10-CM

## 2016-10-09 DIAGNOSIS — M1A9XX0 Chronic gout, unspecified, without tophus (tophi): Secondary | ICD-10-CM

## 2016-10-09 DIAGNOSIS — Z5181 Encounter for therapeutic drug level monitoring: Secondary | ICD-10-CM | POA: Diagnosis not present

## 2016-10-09 DIAGNOSIS — I10 Essential (primary) hypertension: Secondary | ICD-10-CM | POA: Diagnosis not present

## 2016-10-09 LAB — CBC WITH DIFFERENTIAL/PLATELET
BASOS ABS: 0 {cells}/uL (ref 0–200)
BASOS PCT: 0 %
EOS PCT: 3 %
Eosinophils Absolute: 105 cells/uL (ref 15–500)
HCT: 45.8 % — ABNORMAL HIGH (ref 35.0–45.0)
HEMOGLOBIN: 15 g/dL (ref 11.7–15.5)
LYMPHS ABS: 1260 {cells}/uL (ref 850–3900)
Lymphocytes Relative: 36 %
MCH: 29.4 pg (ref 27.0–33.0)
MCHC: 32.8 g/dL (ref 32.0–36.0)
MCV: 89.8 fL (ref 80.0–100.0)
MPV: 9.1 fL (ref 7.5–12.5)
Monocytes Absolute: 490 cells/uL (ref 200–950)
Monocytes Relative: 14 %
NEUTROS ABS: 1645 {cells}/uL (ref 1500–7800)
Neutrophils Relative %: 47 %
PLATELETS: 248 10*3/uL (ref 140–400)
RBC: 5.1 MIL/uL (ref 3.80–5.10)
RDW: 15.6 % — ABNORMAL HIGH (ref 11.0–15.0)
WBC: 3.5 10*3/uL — ABNORMAL LOW (ref 3.8–10.8)

## 2016-10-09 NOTE — Patient Instructions (Signed)

## 2016-10-10 ENCOUNTER — Telehealth: Payer: Self-pay | Admitting: Radiology

## 2016-10-10 LAB — URINALYSIS, ROUTINE W REFLEX MICROSCOPIC
Bilirubin Urine: NEGATIVE
GLUCOSE, UA: NEGATIVE
HGB URINE DIPSTICK: NEGATIVE
Ketones, ur: NEGATIVE
LEUKOCYTES UA: NEGATIVE
NITRITE: NEGATIVE
PROTEIN: NEGATIVE
Specific Gravity, Urine: 1.019 (ref 1.001–1.035)
pH: 6 (ref 5.0–8.0)

## 2016-10-10 LAB — COMPLETE METABOLIC PANEL WITH GFR
ALBUMIN: 4.2 g/dL (ref 3.6–5.1)
ALK PHOS: 106 U/L (ref 33–130)
ALT: 11 U/L (ref 6–29)
AST: 16 U/L (ref 10–35)
BILIRUBIN TOTAL: 0.7 mg/dL (ref 0.2–1.2)
BUN: 14 mg/dL (ref 7–25)
CO2: 26 mmol/L (ref 20–31)
CREATININE: 0.95 mg/dL (ref 0.50–0.99)
Calcium: 9.5 mg/dL (ref 8.6–10.4)
Chloride: 107 mmol/L (ref 98–110)
GFR, Est African American: 71 mL/min (ref >=60)
GFR, Est Non African American: 62 mL/min (ref >=60)
GLUCOSE: 89 mg/dL (ref 65–99)
Potassium: 4.7 mmol/L (ref 3.5–5.3)
SODIUM: 143 mmol/L (ref 135–146)
TOTAL PROTEIN: 7.5 g/dL (ref 6.1–8.1)

## 2016-10-10 LAB — VITAMIN D 25 HYDROXY (VIT D DEFICIENCY, FRACTURES): VIT D 25 HYDROXY: 48 ng/mL (ref 30–100)

## 2016-10-10 LAB — URIC ACID: URIC ACID, SERUM: 3.5 mg/dL (ref 2.5–7.0)

## 2016-10-10 NOTE — Telephone Encounter (Signed)
Called patient labs normal except for slighly low WBC, will monitor. Advised her urine was dark, increase her water intake

## 2016-10-10 NOTE — Telephone Encounter (Signed)
Addressed at sleep appointment.

## 2016-10-11 ENCOUNTER — Telehealth: Payer: Self-pay | Admitting: Cardiovascular Disease

## 2016-10-11 NOTE — Telephone Encounter (Signed)
Patient has a question about her c-pap machine chip.  [facility] read the chip and sent results to Dr. Evette Georges office but patient has not heard anything. She said that her machine needs to be replaced and [place] needs the "prescription" for that

## 2016-10-15 NOTE — Telephone Encounter (Signed)
Spoke w Mariann Laster who has this and will call patient.

## 2016-10-15 NOTE — Telephone Encounter (Signed)
Returned a call to patient. Informed her that I am still waiting on [place] [facility] to send me her download. She states that they said it was faxed in October. I told patient that I don't know where they sent it to, but myself nor medical records has received a download for her. I called  [place] again today and left a message for Ailene Ravel to send the download. Fax number for nurse fax machine left. Patient states that she will also call them again. I apologized to the patient for any inconvenience this has caused. Once her download has been received I can proceed with the order of her new CPAP machine.

## 2016-10-15 NOTE — Telephone Encounter (Signed)
Follow up  Pt voiced she called Thursday and no one has followed back up with her and she's tired of calling.  Please f/u with pt today.

## 2016-11-06 DIAGNOSIS — G4733 Obstructive sleep apnea (adult) (pediatric): Secondary | ICD-10-CM | POA: Diagnosis not present

## 2016-11-08 ENCOUNTER — Ambulatory Visit: Payer: PPO | Admitting: Cardiovascular Disease

## 2016-11-27 ENCOUNTER — Other Ambulatory Visit: Payer: Self-pay | Admitting: Rheumatology

## 2016-11-27 ENCOUNTER — Ambulatory Visit (HOSPITAL_COMMUNITY)
Admission: EM | Admit: 2016-11-27 | Discharge: 2016-11-27 | Disposition: A | Discharge status: Home / Self Care | Payer: PPO | Attending: Family Medicine | Admitting: Family Medicine

## 2016-11-27 ENCOUNTER — Encounter (HOSPITAL_COMMUNITY): Payer: Self-pay | Admitting: *Deleted

## 2016-11-27 DIAGNOSIS — R21 Rash and other nonspecific skin eruption: Secondary | ICD-10-CM

## 2016-11-27 DIAGNOSIS — W57XXXA Bitten or stung by nonvenomous insect and other nonvenomous arthropods, initial encounter: Secondary | ICD-10-CM

## 2016-11-27 MED ORDER — BETAMETHASONE DIPROPIONATE 0.05 % EX OINT: TOPICAL_OINTMENT | Freq: Two times a day (BID) | CUTANEOUS | 0 refills | Status: DC

## 2016-11-27 NOTE — ED Triage Notes (Signed)
Pt  Noticed  Redness    Swelling  To  r  Arm     Pt  Reports  May  Have  Been  Bitten  By  An insect   The  Area is tender   To the  Touch  Itches   As  Well     Pt sitting  Upright on the     Exam table  Speaking   In   Complete   sentances

## 2016-11-27 NOTE — ED Provider Notes (Signed)
[place]    CSN: [id number] Arrival date & time: 11/27/16  1001     History   Chief Complaint No chief complaint on file.   HPI Claudia Cantrell is a 70 y.o. female.   The history is provided by the patient.  Rash  Location:  Shoulder/arm Shoulder/arm rash location:  R forearm Quality: itchiness and swelling   Severity:  Mild Onset quality:  Sudden Duration:  1 day Progression:  Unchanged Chronicity:  New Context: insect bite/sting   Context comment:  Took wreath down from attic and 1hr later noticed itching then swelling to arm. Relieved by:  None tried Worsened by:  Nothing Ineffective treatments:  None tried Associated symptoms: no fever     Past Medical History:  Diagnosis Date  . Arthritis    rheumatoid  . CAD (coronary artery disease)    a. 1992 s/p MI and PTCA of unknown vessel;  b. 09/2010 Cath: LM nl, LAD 20p, LCX 90m, RCA 38m, RPL 20.  . Cancer ([place])   . Family history of anesthesia complication    daughter has difficulty waking   . GERD (gastroesophageal reflux disease)    occ  . Gout 10/08/2016  . Hyperlipidemia   . Hypertensive heart disease   . Nonischemic cardiomyopathy ([place])    a. 07/2016 Echo: EF 40-45%, mild LVH, inferior akinesis, moderately dilated left atrium, trivial AI and MR.  Marland Kitchen PAF (paroxysmal atrial fibrillation) ([place])    a. 07/2016 Admitted w/ AF RVR-->CHA2DS2VASc = 5-->Eliquis;  b. 07/2016 successful TEE/DCCV.  Marland Kitchen Sleep apnea    a. Using CPAP.    Patient Active Problem List   Diagnosis Date Noted  . Gout 10/08/2016  . PAF (paroxysmal atrial fibrillation) ([place])   . Hypertensive heart disease   . Hyperlipidemia   . CAD (coronary artery disease)   . Nonischemic cardiomyopathy ([place])   . Chest pain with moderate risk for cardiac etiology 08/02/2016  . Atrial fibrillation ([place]) 07/29/2016  . Closed right ankle fracture 04/24/2015  . Ankle fracture, bimalleolar, closed 04/24/2015  . Postsurgical hypothyroidism  02/01/2014  . CAD in native artery 10/09/2013  . OSA on CPAP 10/09/2013  . Morbid (severe) obesity due to excess calories ([place]) 05/27/2013  . Essential hypertension, benign 05/27/2013  . Other and unspecified hyperlipidemia 05/27/2013    Past Surgical History:  Procedure Laterality Date  . ABDOMINAL HYSTERECTOMY  1988  . BACK SURGERY  1994  . CARDIAC CATHETERIZATION  1991 AND 1992   PTCA BY DR Lamount Cohen  . CARDIOVERSION N/A 08/01/2016   Procedure: CARDIOVERSION;  Surgeon: Lelon Perla, MD;  Location: [facility] ENDOSCOPY;  Service: Cardiovascular;  Laterality: N/A;  . ORIF ANKLE FRACTURE Right 04/27/2015   Procedure: OPEN REDUCTION INTERNAL FIXATION (ORIF) RIGHT BIMALLEOLAR ANKLE FRACTURE WITH SYNDESMOSIS FIXATION;  Surgeon: Leandrew Koyanagi, MD;  Location: [facility];  Service: Orthopedics;  Laterality: Right;  . TEE WITHOUT CARDIOVERSION N/A 08/01/2016   Procedure: TRANSESOPHAGEAL ECHOCARDIOGRAM (TEE);  Surgeon: Lelon Perla, MD;  Location: [facility] ENDOSCOPY;  Service: Cardiovascular;  Laterality: N/A;  . THYROIDECTOMY  11/24/2013   DR Dalbert Batman  . THYROIDECTOMY N/A 11/24/2013   Procedure: TOTAL THYROIDECTOMY;  Surgeon: Adin Hector, MD;  Location: [facility];  Service: General;  Laterality: N/A;  . TRANSTHORACIC ECHOCARDIOGRAM  01/15/2013   EF 55% TO 65%. PROBABLE MILD HYPOKINESIS OF THE INFERIOR MYOCARDIUM. GRADE 1 DIASTOLIC DYSFUNCTION. TRIAL AR.LA IS MILDLY DILATED.    OB History    No data available  Home Medications    Prior to Admission medications   Medication Sig Start Date End Date Taking? Authorizing Provider  Acetaminophen (TYLENOL EXTRA STRENGTH PO) Take 2 tablets by mouth as needed.    Historical Provider, MD  amLODipine (NORVASC) 5 MG tablet Take 5 mg by mouth daily.    Historical Provider, MD  apixaban (ELIQUIS) 5 MG TABS tablet Take 1 tablet (5 mg total) by mouth 2 (two) times daily. 08/02/16   Cheryln Manly, NP  aspirin EC 81 MG tablet Take 81 mg by mouth daily.     Historical Provider, MD  Biotin 10000 MCG TABS Take 1 capsule by mouth daily.    Historical Provider, MD  Cholecalciferol (VITAMIN D-3 PO) Take 2,000 Units by mouth daily.     Historical Provider, MD  docusate sodium (COLACE) 100 MG capsule Take 100 mg by mouth 2 (two) times daily.    Historical Provider, MD  febuxostat (ULORIC) 40 MG tablet Take 80 mg by mouth daily.     Historical Provider, MD  metoprolol succinate (TOPROL XL) 50 MG 24 hr tablet Take 1 tablet (50 mg total) by mouth daily. Take with or immediately following a meal. 08/02/16   Cheryln Manly, NP  Probiotic Product (PROBIOTIC-10) CAPS Take 1 capsule by mouth daily.    Historical Provider, MD  rosuvastatin (CRESTOR) 20 MG tablet Take 20 mg by mouth daily.    Historical Provider, MD  SYNTHROID 125 MCG tablet TAKE ONE TABLET BY MOUTH ONCE DAILY 04/20/15   [name], MD  valsartan-hydrochlorothiazide (DIOVAN-HCT) 320-12.5 MG tablet Take 1 tablet by mouth daily.    Historical Provider, MD    Family History Family History  Problem Relation Age of Onset  . Heart disease Mother   . Sudden death Mother   . Diabetes Father   . Stroke Father   . Bone cancer Sister   . Sudden death Brother   . Melanoma Brother   . Heart disease Brother     Social History Social History  Substance Use Topics  . Smoking status: Former Smoker    Packs/day: 1.00    Years: 15.00    Types: Cigarettes    Quit date: 12/10/1990  . Smokeless tobacco: Never Used     Comment: occ alcohol  . Alcohol use Yes     Comment: very rare     Allergies   Codeine   Review of Systems Review of Systems  Constitutional: Negative.  Negative for fever.  Musculoskeletal: Negative.   Skin: Positive for rash.  Neurological: Negative.   All other systems reviewed and are negative.    Physical Exam Triage Vital Signs ED Triage Vitals [11/27/16 1035]  Enc Vitals Group     BP      Pulse      Resp      Temp      Temp src      SpO2      Weight       Height      Head Circumference      Peak Flow      Pain Score 6     Pain Loc      Pain Edu?      Excl. in [place]?    No data found.   Updated Vital Signs There were no vitals taken for this visit.  Visual Acuity Right Eye Distance:   Left Eye Distance:   Bilateral Distance:    Right Eye Near:   Left Eye  Near:    Bilateral Near:     Physical Exam  Constitutional: She is oriented to person, place, and time. She appears well-developed and well-nourished. She appears distressed.  Neurological: She is alert and oriented to person, place, and time.  Skin: Skin is warm and dry. Rash noted. There is erythema.  Local sts with central crusted lesion, no sign of infection, nontender, mild pruritis.  Nursing note and vitals reviewed.    UC Treatments / Results  Labs (all labs ordered are listed, but only abnormal results are displayed) Labs Reviewed - No data to display  EKG  EKG Interpretation None       Radiology No results found.  Procedures Procedures (including critical care time)  Medications Ordered in UC Medications - No data to display   Initial Impression / Assessment and Plan / UC Course  I have reviewed the triage vital signs and the nursing notes.  Pertinent labs & imaging results that were available during my care of the patient were reviewed by me and considered in my medical decision making (see chart for details).  Clinical Course       Final Clinical Impressions(s) / UC Diagnoses   Final diagnoses:  None    New Prescriptions New Prescriptions   No medications on file     Billy Fischer, MD 11/27/16 1047

## 2016-11-28 NOTE — Telephone Encounter (Signed)
Last Visit: 04/02/16 Next Visit: 04/09/17 Labs: 10/09/16 WBC decreased   Okay to refill Uloric?

## 2016-12-06 DIAGNOSIS — G4733 Obstructive sleep apnea (adult) (pediatric): Secondary | ICD-10-CM | POA: Diagnosis not present

## 2017-01-03 ENCOUNTER — Encounter: Payer: Self-pay | Admitting: Cardiovascular Disease

## 2017-01-03 ENCOUNTER — Ambulatory Visit (INDEPENDENT_AMBULATORY_CARE_PROVIDER_SITE_OTHER): Payer: PPO | Admitting: Cardiovascular Disease

## 2017-01-03 VITALS — BP 172/93 | HR 62 | Ht 65.0 in | Wt 224.0 lb

## 2017-01-03 DIAGNOSIS — G4733 Obstructive sleep apnea (adult) (pediatric): Secondary | ICD-10-CM | POA: Diagnosis not present

## 2017-01-03 DIAGNOSIS — I2510 Atherosclerotic heart disease of native coronary artery without angina pectoris: Secondary | ICD-10-CM | POA: Diagnosis not present

## 2017-01-03 DIAGNOSIS — I480 Paroxysmal atrial fibrillation: Secondary | ICD-10-CM

## 2017-01-03 DIAGNOSIS — I10 Essential (primary) hypertension: Secondary | ICD-10-CM | POA: Diagnosis not present

## 2017-01-03 NOTE — Patient Instructions (Signed)
Your physician wants you to follow-up in: 6 months or sooner if needed in a sleep clinic. You will receive a reminder letter in the mail two months in advance. If you don't receive a letter, please call our office to schedule the follow-up appointment.

## 2017-01-04 NOTE — Progress Notes (Signed)
Patient ID: Claudia Cantrell, female   DOB: [birth year], 71 y.o.   MRN: [id number]     HPI: Claudia Cantrell is a 71 y.o. female who presents to the office today for a sleep clinic evaluation following call 54 new CPAP machine.  Claudia Cantrell suffered remote myocardial infarctions in 1991 and 1992 and at that time underwent PTCA by Dr. Lamount Cantrell.  She does have a history of hypertension, hyperlipidemia, obstructive sleep apnea on CPAP therapy, obesity, and hypothyroidism with multinodular goiter. In October 2011 she underwent cardiac catheterization after she had experienced episodes of chest pain. Catheterization showed preserved LV contractility although the was a very small focal area of severe hypocontractility involving the mid inferior wall as well as low posterior lateral wall in this patient status post remote myocardial infarction approximately 20 years ago treated with PTCA. At the time of her catheterization she did not have significant carotid obstructive disease and only had mild 10-20% narrowing at the ostium of the LAD, 20% irregularity in the mid AV groove circumflex, and 20% mid RCA and 20% posterior lateral irregularities. Subsequently, she has done well from a cardiovascular standpoint.  She is a history of a multinodular goiter and in 2014, underwent thyroidectomy and tolerated this well from a cardiovascular standpoint.  Since I saw her, she broke her leg in 2016.  She has noticed some increasing shortness of breath particularly when she eats chocolate.  She has been followed by Dr. Jeanann Cantrell, for primary care.  She developed atrial fibrillation this year and was admitted August 2017 with AF with rapid ventricular response.  With a cha2ds2vasc score of 5.  She was started on eliquis..  She underwent successful TEE guided cardioversion with restoration of sinus rhythm.  She saw Claudia Cantrell on 08/08/2016 for hospital follow-up and was doing well.  An echo Doppler study in the setting  of AF showed an ejection fraction of 40-45%.  She underwent a nuclear perfusion study on August 30 117, which was low risk and suggested the possibility of a small basal to mid inferolateral defect.  There was no ischemia.  She has obstructive sleep apnea and has been on CPAP therapy since 2011.  When I saw her in October 2017, her CPAP machine was broken.  She was 100% compliant and on her prior sleep study.  She had been utilizing CPAP with 97 m water pressure.  While 5 for new machine, Claudia  Cantrell 10 AutoSet for her CPAP unit from [employer] care as a DME company.  Two-month of compliance data were reviewed.  11/06/2016 through 12/05/2016 usage stays was 97% and usage greater than 4 hours was 97%.  She was averaging 7 hours and 9 minutes.  AHI was excellent at 0.9 at her 9 cm water pressure.  There was no leak.  Over the past month, she has had off and on issues with her new CPAP machine such that it tends to cut off.  She states that she has been using it 100% of the days, but at times it just does not start.  This is confirmed by her most recent download from 12/05/2016.  Gender 25 2018.  There were 7 days, where apparently the machine did not function.  AHI again remain excellent at 0.4.  With her new machine, she is sleeping well.  An Epworth Sleepiness Scale score was recalculated at 0.  She is feeling better.  She denies palpitations.  She presents for reevaluation.  Past Medical History:  Diagnosis Date  . Arthritis    rheumatoid  . CAD (coronary artery disease)    a. 1992 s/p MI and PTCA of unknown vessel;  b. 09/2010 Cath: LM nl, LAD 20p, LCX 62m RCA 146mRPL 20.  . Cancer ([place]  . Family history of anesthesia complication    daughter has difficulty waking   . GERD (gastroesophageal reflux disease)    occ  . Gout 10/08/2016  . Hyperlipidemia   . Hypertensive heart disease   . Nonischemic cardiomyopathy ([facility]   a. 07/2016 Echo: EF 40-45%, mild LVH, inferior akinesis, moderately  dilated left atrium, trivial AI and MR.  . Marland KitchenAF (paroxysmal atrial fibrillation) ([facility]   a. 07/2016 Admitted w/ AF RVR-->CHA2DS2VASc = 5-->Eliquis;  b. 07/2016 successful TEE/DCCV.  . Marland Kitchenleep apnea    a. Using CPAP.    Past Surgical History:  Procedure Laterality Date  . ABDOMINAL HYSTERECTOMY  1988  . BACK SURGERY  1994  . CARDIAC CATHETERIZATION  1991 AND 1992   PTCA BY DR CHLamount Cantrell. CARDIOVERSION N/A 08/01/2016   Procedure: CARDIOVERSION;  Surgeon: BrLelon PerlaMD;  [facility];  Service: Cardiovascular;  Laterality: N/A;  . ORIF ANKLE FRACTURE Right 04/27/2015   Procedure: OPEN REDUCTION INTERNAL FIXATION (ORIF) RIGHT BIMALLEOLAR ANKLE FRACTURE WITH SYNDESMOSIS FIXATION;  Surgeon: NaLeandrew KoyanagiMD;  [facility] Service: Orth[facility]ics;  Laterality: Right;  . TEE WITHOUT CARDIOVERSION N/A 08/01/2016   Procedure: TRANSESOPHAGEAL ECHOCARDIOGRAM (TEE);  Surgeon: BrLelon PerlaMD;  [facility];  Service: Cardiovascular;  Laterality: N/A;  . THYROIDECTOMY  11/24/2013   DR INDalbert Batman. THYROIDECTOMY N/A 11/24/2013   Procedure: TOTAL THYROIDECTOMY;  Surgeon: HaAdin HectorMD;  [facility] Service: General;  Laterality: N/A;  . TRANSTHORACIC ECHOCARDIOGRAM  01/15/2013   EF 55% TO 65%. PROBABLE MILD HYPOKINESIS OF THE INFERIOR MYOCARDIUM. GRADE 1 DIASTOLIC DYSFUNCTION. TRIAL AR.LA IS MILDLY DILATED.    Allergies  Allergen Reactions  . Codeine Anxiety    Current Outpatient Prescriptions  Medication Sig Dispense Refill  . Acetaminophen (TYLENOL EXTRA STRENGTH PO) Take 2 tablets by mouth as needed.    . Marland Kitchenpixaban (ELIQUIS) 5 MG TABS tablet Take 1 tablet (5 mg total) by mouth 2 (two) times daily. 60 tablet 0  . aspirin EC 81 MG tablet Take 81 mg by mouth daily.    . betamethasone dipropionate (DIPROLENE) 0.05 % ointment Apply topically 2 (two) times daily. 15 g 0  . Biotin 10000 MCG TABS Take 1 capsule by mouth daily.    . Cholecalciferol (VITAMIN  D-3 PO) Take 2,000 Units by mouth daily.     . Marland Kitchenocusate sodium (COLACE) 100 MG capsule Take 100 mg by mouth 2 (two) times daily.    . febuxostat (ULORIC) 40 MG tablet Take 80 mg by mouth daily.     . metoprolol succinate (TOPROL XL) 50 MG 24 hr tablet Take 1 tablet (50 mg total) by mouth daily. Take with or immediately following a meal. 30 tablet 11  . Probiotic Product (PROBIOTIC-10) CAPS Take 1 capsule by mouth daily.    . rosuvastatin (CRESTOR) 20 MG tablet Take 20 mg by mouth daily.    . Marland KitchenYNTHROID 125 MCG tablet TAKE ONE TABLET BY MOUTH ONCE DAILY 30 tablet 0  . ULORIC 80 MG TABS TAKE ONE TABLET BY MOUTH ONCE DAILY 90 tablet 1   No current facility-administered medications for this visit.     Social History  Social History  . Marital status: Divorced    Spouse name: N/A  . Number of children: N/A  . Years of education: N/A   Occupational History  . Not on file.   Social History Main Topics  . Smoking status: Former Smoker    Packs/day: 1.00    Years: 15.00    Types: Cigarettes    Quit date: 12/10/1990  . Smokeless tobacco: Never Used     Comment: occ alcohol  . Alcohol use Yes     Comment: very rare  . Drug use: No  . Sexual activity: Not on file   Other Topics Concern  . Not on file   Social History Narrative  . No narrative on file    Family History  Problem Relation Age of Onset  . Heart disease Mother   . Sudden death Mother   . Diabetes Father   . Stroke Father   . Bone cancer Sister   . Sudden death Brother   . Melanoma Brother   . Heart disease Brother    Social history is notable that she is widowed and has 2 children plus one adopted child as well as 2 grandchildren. She does not routinely exercise. There is no tobacco or alcohol use.   ROS General: Negative; No fevers, chills, or night sweats;  HEENT: Negative; No changes in vision or hearing, sinus congestion, difficulty swallowing Pulmonary: Negative; No cough, wheezing, shortness of breath,  hemoptysis Cardiovascular: Negative; No chest pain, presyncope, syncope, palpitations GI: Negative; No nausea, vomiting, diarrhea, or abdominal pain GU: Negative; No dysuria, hematuria, or difficulty voiding Musculoskeletal: Negative; no myalgias, joint pain, or weakness Hematologic/Oncology: Negative; no easy bruising, bleeding Endocrine: History of multinodular goiter status post thyroidectomy Neuro: Negative; no changes in balance, headaches Skin: Negative; No rashes or skin lesions Psychiatric: Negative; No behavioral problems, depression Sleep: Positive obstructive sleep apnea.  She admits to 100% compliance.  Her machine is broken.Other comprehensive 14 point system review is negative.   PE BP (!) 172/93   Pulse 62   Ht _0  (1.651 m)   Wt 224 lb (101.6 kg)   BMI 37.28 kg/m    Repeat blood pressure was 134/86.  Wt Readings from Last 3 Encounters:  01/03/17 224 lb (101.6 kg)  10/09/16 222 lb (100.7 kg)  04/02/16 224 lb (101.6 kg)   General: Alert, oriented, no distress. Moderate obesity Skin: normal turgor, no rashes HEENT: Normocephalic, atraumatic. Pupils round and reactive; sclera anicteric;no lid lag.  Nose without nasal septal hypertrophy Mouth/Parynx benign; Mallinpatti scale 3 Neck: No JVD, no carotid briuts; Lungs: clear to ausculatation and percussion; no wheezing or rales Heart: RRR, s1 s2 normal 1/6 sem Abdomen: soft, nontender; no hepatosplenomehaly, BS+; abdominal aorta nontender and not dilated by palpation. Pulses 2+ Extremities: no clubbing cyanosis or edema, Homan's sign negative  Neurologic: grossly nonfocal Psychologic: normal affect and mood.  No new ECG was obtained today.  October  2017 ECG (independently read by me): Sinus bradycardia 52 bpm.  Poor anterior R-wave progression.  Normal intervals.  October 2014 ECG: Sinus rhythm at 69 beats per minute. Normal intervals. Nonspecific T changes.  LABS:  BMP Latest Ref Rng & Units 10/09/2016  07/30/2016 07/29/2016  Glucose 65 - 99 mg/dL 89 98 119(H)  BUN 7 - 25 mg/dL _1 Creatinine 0.50 - 0.99 mg/dL 0.95 0.77 0.96  Sodium 135 - 146 mmol/L 143 140 138  Potassium 3.5 - 5.3 mmol/L 4.7 3.5 3.5  Chloride 98 -  110 mmol/L 107 107 107  CO2 20 - 31 mmol/L 26 24 21(L)  Calcium 8.6 - 10.4 mg/dL 9.5 9.1 9.4   Hepatic Function Latest Ref Rng & Units 10/09/2016 03/29/2016 04/24/2015  Total Protein 6.1 - 8.1 g/dL 7.5 - 8.2(H)  Albumin 3.6 - 5.1 g/dL 4.2 - 4.6  AST 10 - 35 U/L _0 ALT 6 - 29 U/L _1 Alk Phosphatase 33 - 130 U/L 106 105 98  Total Bilirubin 0.2 - 1.2 mg/dL 0.7 - 0.6   CBC Latest Ref Rng & Units 10/09/2016 08/02/2016 08/01/2016  WBC 3.8 - 10.8 K/uL 3.5(L) 4.5 4.0  Hemoglobin 11.7 - 15.5 g/dL 15.0 14.2 14.6  Hematocrit 35.0 - 45.0 % 45.8(H) 44.3 44.9  Platelets 140 - 400 K/uL 248 263 245   Lab Results  Component Value Date   MCV 89.8 10/09/2016   MCV 91.0 08/02/2016   MCV 90.2 08/01/2016   Lab Results  Component Value Date   TSH 1.313 07/30/2016    Lipid Panel     Component Value Date/Time   CHOL 138 07/30/2016 0550   TRIG 131 07/30/2016 0550   HDL 31 (L) 07/30/2016 0550   CHOLHDL 4.5 07/30/2016 0550   VLDL 26 07/30/2016 0550   LDLCALC 81 07/30/2016 0550    ASSESSMENT AND PLAN: Claudia Cantrell is 71 year-old [place] American female who  suffered a remote myocardial infarction approximately 24 years ago and underwent PTCA by Dr. Sherald Barge 27 years ago.  Cardiac catheterization in 2011 demonstrated a small focal wall motion abnormality in the inferior and posterior lateral wall concordant with his myocardial infarction. She has mild nonobstructive CAD. Her most recent nuclear perfusion study shows an ejection fraction of 55% and findings consistent with a small area of scar in the basal and mid inferolateral wall concordant with her left circumflex infarct.  She developed atrial fibrillation in August 2017 and underwent successful TEE cardioversion.   She is on eliquis anticoagulation and has been maintaining sinus rhythm.  She qualifies for new CPAP machine and received an air since 10 AutoSet for her unit.  She is meeting compliance standards.  She states that this machine is significantly improved from her old unit.  However, over the past month she has had issues with the machine not turning on.  I have recommended that she bring the machine to [facility] for inspection.  The machine is still under warranty and she may qualify for new unit if the problem cannot be fixed.  Her AHI with treatment is excellent at 0.9 and 0.4 on both downloads.  She is using a Respironics Dream nasal mask which she likes significantly more than her previous equipment.  I will continue her set pressure at 9 cm.  I will see her in 6 months for cardiology follow-up evaluation.  Time spent: 25 minutes  Troy Sine, MD, [facility]  01/04/2017 2:54 PM

## 2017-01-06 DIAGNOSIS — G4733 Obstructive sleep apnea (adult) (pediatric): Secondary | ICD-10-CM | POA: Diagnosis not present

## 2017-02-06 DIAGNOSIS — G4733 Obstructive sleep apnea (adult) (pediatric): Secondary | ICD-10-CM | POA: Diagnosis not present

## 2017-02-06 IMAGING — DX DG CHEST 2V
2 series · 2 of 2 positions shown · non-contrast
Comparison: 11/17/2013

CLINICAL DATA: Chest pain

EXAM:
CHEST  2 VIEW

[chest pa]
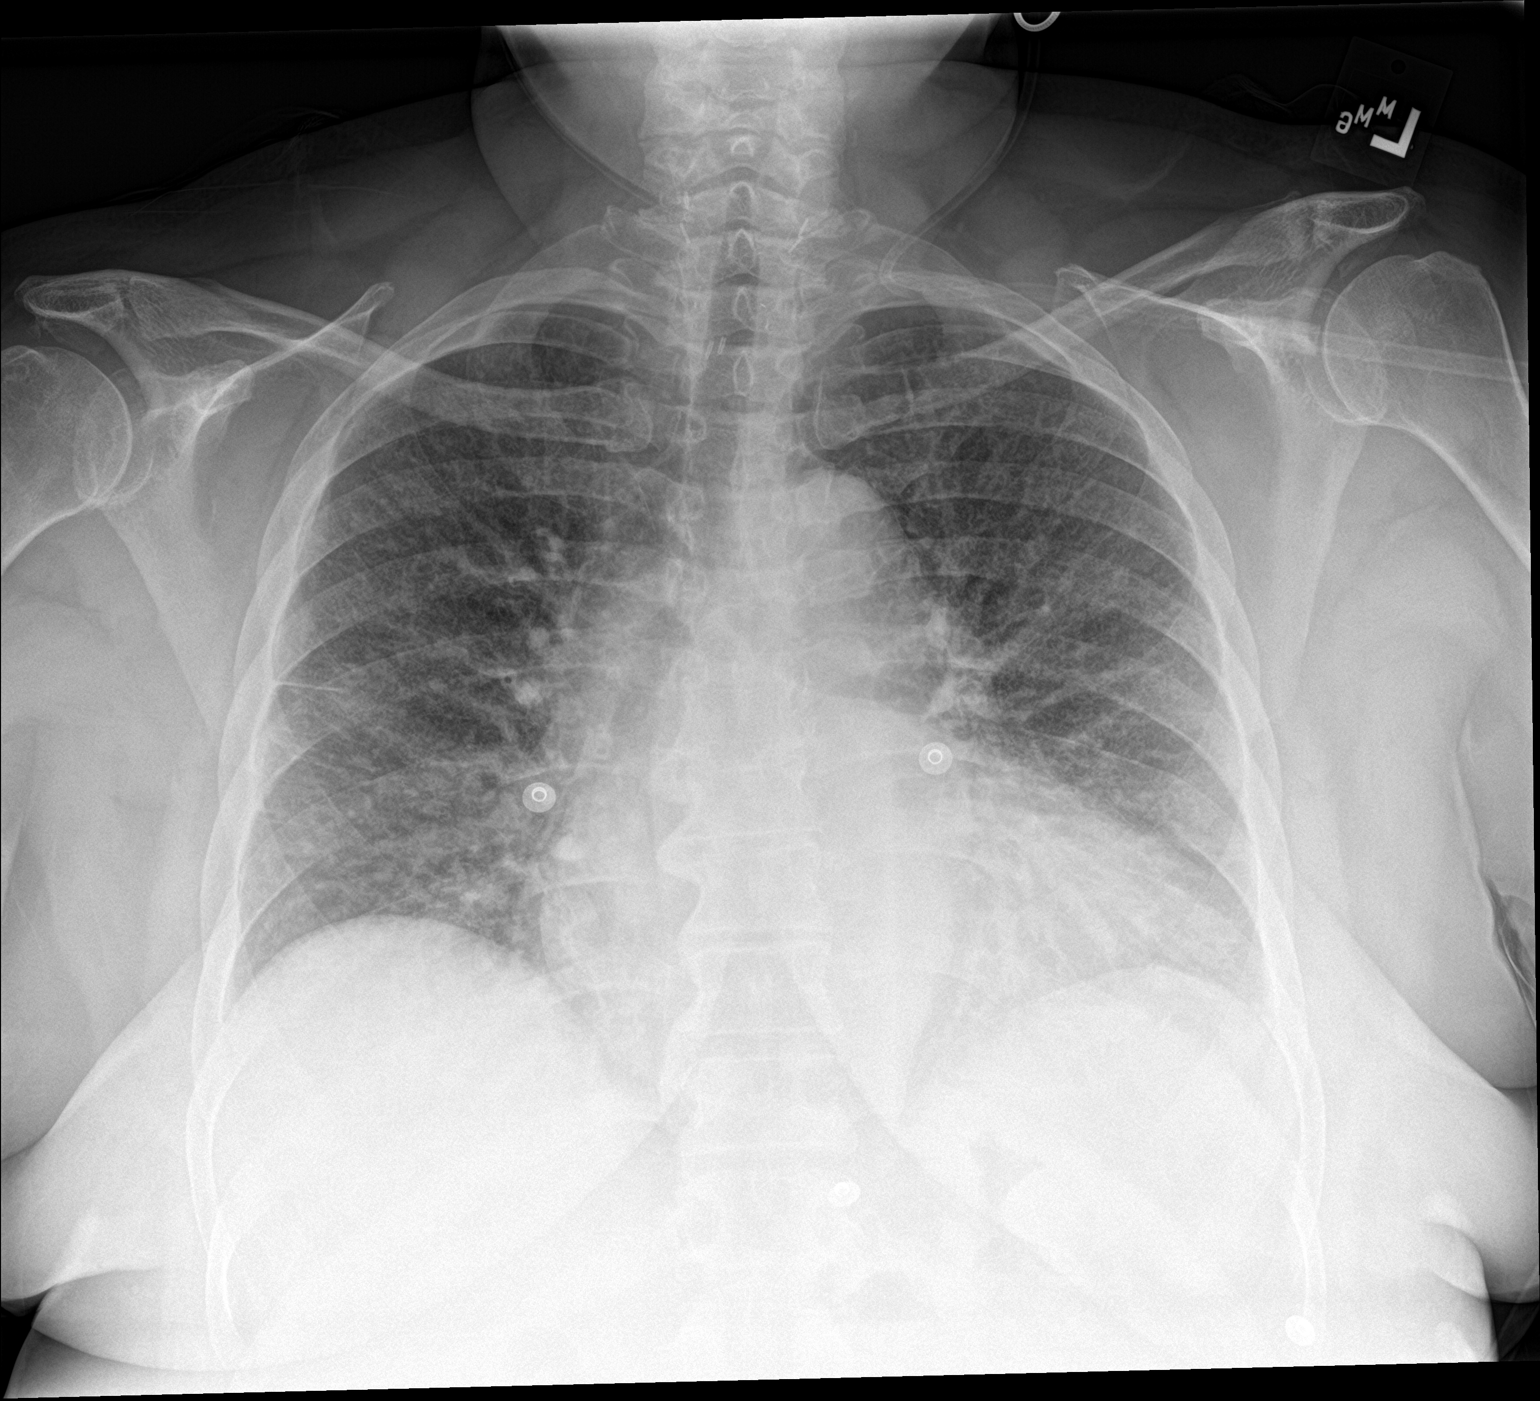

[chest lat]
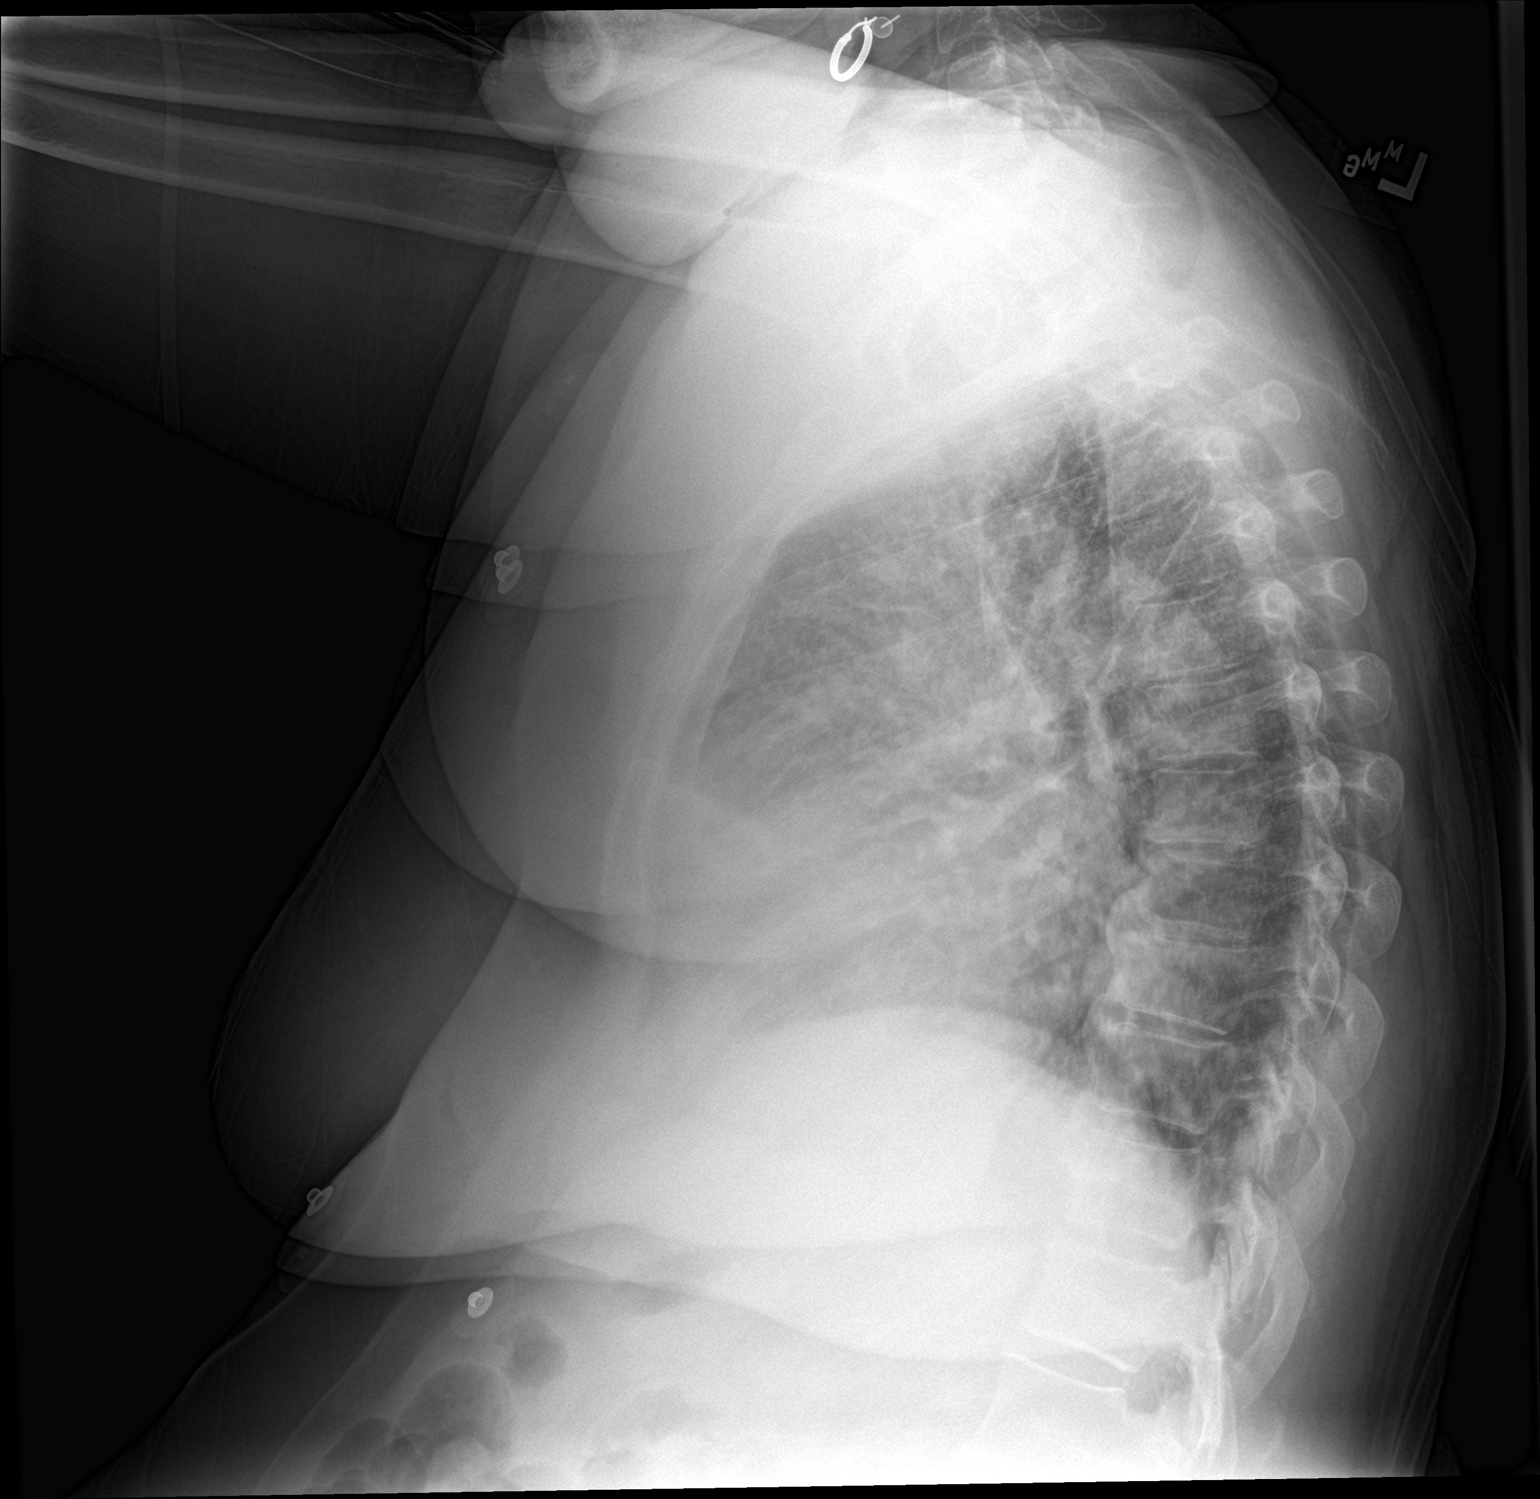

[2 of 2 positions shown; findings below may reference images not displayed]

FINDINGS: Symmetric interstitial opacities with Kerley lines and vascular
pedicle widening. Chronic cardiomegaly. Aortic tortuosity. No
consolidation, effusion, or pneumothorax. No acute osseous finding.
IMPRESSION: Cardiomegaly and interstitial edema.

## 2017-03-05 ENCOUNTER — Telehealth: Payer: Self-pay

## 2017-03-05 NOTE — Telephone Encounter (Signed)
Received a fax from [facility] regarding a prior authorization approval for Uloric.   Reference number:[id number] Phone [phone]  Will send document to scan center.  Left message for patient to call back.  Yaphet Smethurst, [place], CPhT   1:23 PM

## 2017-03-06 DIAGNOSIS — G4733 Obstructive sleep apnea (adult) (pediatric): Secondary | ICD-10-CM | POA: Diagnosis not present

## 2017-03-20 ENCOUNTER — Telehealth: Payer: Self-pay | Admitting: Cardiovascular Disease

## 2017-03-20 NOTE — Telephone Encounter (Signed)
New message   Pt is calling asking for RN to call her about her medication.

## 2017-03-20 NOTE — Telephone Encounter (Signed)
The patient stated that her current PCP had retired. He was the physician who prescribed/refilled her Rosuvastatin (20 mg daily). She would like to know if Dr. Claiborne Billings would refill this until she can find another PCP.

## 2017-03-22 MED ORDER — ROSUVASTATIN CALCIUM 20 MG PO TABS: 20.0000 mg | ORAL_TABLET | Freq: Every day | ORAL | 3 refills | Status: DC

## 2017-03-22 NOTE — Telephone Encounter (Signed)
Returned the phone call to the patient to inform her that Dr. Claiborne Billings has approved the refill of Claudia Cantrell 20 mg daily for her. It has been called in to [facility].

## 2017-03-22 NOTE — Telephone Encounter (Signed)
Okay to refill and have me provide this prescription

## 2017-03-26 DIAGNOSIS — Z5181 Encounter for therapeutic drug level monitoring: Secondary | ICD-10-CM | POA: Insufficient documentation

## 2017-03-26 DIAGNOSIS — E559 Vitamin D deficiency, unspecified: Secondary | ICD-10-CM | POA: Insufficient documentation

## 2017-03-26 DIAGNOSIS — Z8739 Personal history of other diseases of the musculoskeletal system and connective tissue: Secondary | ICD-10-CM | POA: Insufficient documentation

## 2017-03-26 DIAGNOSIS — E790 Hyperuricemia without signs of inflammatory arthritis and tophaceous disease: Secondary | ICD-10-CM | POA: Insufficient documentation

## 2017-03-26 NOTE — Progress Notes (Signed)
Office Visit Note  Patient: Claudia Cantrell             Date of Birth: 03-27-1946           MRN: [id number]             PCP: Patricia Nettle, MD Referring: Wallene Huh, MD Visit Date: 04/09/2017 Occupation: '@GUAROCC' @    Subjective:  Hand pain   History of Present Illness: Claudia Cantrell is a 71 y.o. female with a history of gout.   No recent flares  More stiffness and achy joints widespread   Left thumb: stiff and trigger   Activities of Daily Living:  Patient reports morning stiffness for 2 hours.   Patient Denies nocturnal pain.  Difficulty dressing/grooming: Denies Difficulty climbing stairs: Reports Difficulty getting out of chair: Denies Difficulty using hands for taps, buttons, cutlery, and/or writing: Reports   Review of Systems  Constitutional: Positive for fatigue. Negative for weight gain and weight loss.  HENT: Negative for mouth sores, mouth dryness and nose dryness.   Eyes: Negative for dryness.  Respiratory: Negative for cough, shortness of breath and difficulty breathing.   Cardiovascular: Negative for chest pain and palpitations.  Gastrointestinal: Negative for constipation and diarrhea.    PMFS History:  Patient Active Problem List   Diagnosis Date Noted  . 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 Vitamin D deficiency 03/26/2017  . Medication monitoring encounter 03/26/2017  . Gout 10/08/2016  . PAF (paroxysmal atrial fibrillation) ([place])   . Hypertensive heart disease   . Hyperlipidemia   . CAD (coronary artery disease)   . Nonischemic cardiomyopathy ([place])   . Chest pain with moderate risk for cardiac etiology 08/02/2016  . Atrial fibrillation ([place]) 07/29/2016  . Closed right ankle fracture 04/24/2015  . Ankle fracture, bimalleolar, closed 04/24/2015  . 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 CAD in native artery 10/09/2013  . OSA on CPAP 10/09/2013  . Morbid (severe) obesity due to excess  calories ([place]) 05/27/2013  . Essential hypertension, benign 05/27/2013  . Other and unspecified hyperlipidemia 05/27/2013    Past Medical History:  Diagnosis Date  . Arthritis    rheumatoid  . CAD (coronary artery disease)    a. 1992 s/p MI and PTCA of unknown vessel;  b. 09/2010 Cath: LM nl, LAD 20p, LCX 68m RCA 18mRPL 20.  . Cancer ([facility]  . Family history of anesthesia complication    daughter has difficulty waking   . GERD (gastroesophageal reflux disease)    occ  . Gout 10/08/2016  . Hyperlipidemia   . Hypertensive heart disease   . [place]ischemic cardiomyopathy ([place]   a. 07/2016 Echo: EF 40-45%, mild LVH, inferior akinesis, moderately dilated left atrium, trivial AI and MR.  . Marland KitchenAF (paroxysmal atrial fibrillation) ([facility]   a. 07/2016 Admitted w/ AF RVR-->CHA2DS2VASc = 5-->Eliquis;  b. 07/2016 successful TEE/DCCV.  . Marland Kitchenleep apnea    a. Using CPAP.    Family History  Problem Relation Age of Onset  . Heart disease Mother   . Sudden death Mother   . Diabetes Father   . Stroke Father   . Bone cancer Sister   . Sudden death Brother   . Melanoma Brother   . Heart disease Brother    Past Surgical History:  Procedure Laterality Date  . ABDOMINAL HYSTERECTOMY  1988  . BACK SURGERY  1994  . [facility] [place]  CHARLES HARSHAW  . CARDIOVERSION N/A 08/01/2016   Procedure: CARDIOVERSION;  Surgeon: Lelon Perla, MD;  Location[facility];  Service: Cardiovascular;  Laterality: N/A;  . ORIF ANKLE FRACTURE Right 04/27/2015   Procedure: OPEN REDUCTION INTERNAL FIXATION (ORIF) RIGHT BIMALLEOLAR ANKLE FRACTURE WITH SYNDESMOSIS FIXATION;  Surgeon: Leandrew Koyanagi, MD;  Location: [place];  Service: Orthopedics;  Laterality: Right;  . TEE WITHOUT CARDIOVERSION N/A 08/01/2016   Procedure: TRANSESOPHAGEAL ECHOCARDIOGRAM (TEE);  Surgeon: Lelon Perla, MD;  Location: [facility] ENDOSCOPY;  Service: Cardiovascular;  Laterality: N/A;  . THYROIDECTOMY  11/24/2013   DR  Dalbert Batman  . THYROIDECTOMY N/A 11/24/2013   Procedure: TOTAL THYROIDECTOMY;  Surgeon: Adin Hector, MD;  Location: [place];  Service: General;  Laterality: N/A;  . TRANSTHORACIC ECHOCARDIOGRAM  01/15/2013   EF 55% TO 65%. PROBABLE MILD HYPOKINESIS OF THE INFERIOR MYOCARDIUM. GRADE 1 DIASTOLIC DYSFUNCTION. TRIAL AR.LA IS MILDLY DILATED.   Social History   Social History Narrative  . No narrative on file     Objective: Vital Signs: BP (!) 198/85   Pulse 64   Resp 14   Ht '5\' 6"'  (1.676 m)   Wt 222 lb (100.7 kg)   BMI 35.83 kg/m    Physical Exam  Constitutional: She is oriented to person, place, and time. She appears well-developed and well-nourished.  HENT:  Head: Normocephalic and atraumatic.  Eyes: Conjunctivae and EOM are normal.  Neck: Normal range of motion.  Cardiovascular: Normal rate, regular rhythm, normal heart sounds and intact distal pulses.   Mild pedal edema  Pulmonary/Chest: Effort normal and breath sounds normal.  Abdominal: Soft. Bowel sounds are normal.  Lymphadenopathy:    She has no cervical adenopathy.  Neurological: She is alert and oriented to person, place, and time.  Skin: Skin is warm and dry. Capillary refill takes less than 2 seconds.  Psychiatric: She has a normal mood and affect. Her behavior is normal.  Nursing note and vitals reviewed.    Musculoskeletal Exam: C-spine and thoracic lumbar spine good range of motion. Shoulder joints although joints wrist joints are good range of motion. 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 Hip joints knee joints ankles MTPs PIPs with good range of motion. She had right ankle joint fracture in the past which is been healing well.  CDAI Exam: No CDAI exam completed.    Investigation: No additional findings. No visits with results within 2 Month(s) from this visit.  Latest known visit with results is:  Office Visit on 10/09/2016  Component Date Value Ref Range Status    . WBC 10/09/2016 3.5* 3.8 - 10.8 K/uL Final  . RBC 10/09/2016 5.10  3.80 - 5.10 MIL/uL Final  . Hemoglobin 10/09/2016 15.0  11.7 - 15.5 g/dL Final  . HCT 10/09/2016 45.8* 35.0 - 45.0 % Final  . MCV 10/09/2016 89.8  80.0 - 100.0 fL Final  . MCH 10/09/2016 29.4  27.0 - 33.0 pg Final  . MCHC 10/09/2016 32.8  32.0 - 36.0 g/dL Final  . RDW 10/09/2016 15.6* 11.0 - 15.0 % Final  . Platelets 10/09/2016 248  140 - 400 K/uL Final  . MPV 10/09/2016 9.1  7.5 - 12.5 fL Final  . Neutro Abs 10/09/2016 1645  1,500 - 7,800 cells/uL Final  . Lymphs Abs 10/09/2016 1260  850 - 3,900 cells/uL Final  . Monocytes Absolute 10/09/2016 490  200 - 950 cells/uL Final  . Eosinophils Absolute 10/09/2016 105  15 -  500 cells/uL Final  . Basophils Absolute 10/09/2016 0  0 - 200 cells/uL Final  . Neutrophils Relative % 10/09/2016 47  % Final  . Lymphocytes Relative 10/09/2016 36  % Final  . Monocytes Relative 10/09/2016 14  % Final  . Eosinophils Relative 10/09/2016 3  % Final  . Basophils Relative 10/09/2016 0  % Final  . Smear Review 10/09/2016 Criteria for review not met   Final  . Sodium 10/09/2016 143  135 - 146 mmol/L Final  . Potassium 10/09/2016 4.7  3.5 - 5.3 mmol/L Final  . Chloride 10/09/2016 107  98 - 110 mmol/L Final  . CO2 10/09/2016 26  20 - 31 mmol/L Final  . Glucose, Bld 10/09/2016 89  65 - 99 mg/dL Final  . BUN 10/09/2016 14  7 - 25 mg/dL Final  . Creat 10/09/2016 0.95  0.50 - 0.99 mg/dL Final   Comment:   For patients > or = 69 years of age: The upper reference limit for Creatinine is approximately 13% higher for people identified as African-American.     . Total Bilirubin 10/09/2016 0.7  0.2 - 1.2 mg/dL Final  . Alkaline Phosphatase 10/09/2016 106  33 - 130 U/L Final  . AST 10/09/2016 16  10 - 35 U/L Final  . ALT 10/09/2016 11  6 - 29 U/L Final  . Total Protein 10/09/2016 7.5  6.1 - 8.1 g/dL Final  . Albumin 10/09/2016 4.2  3.6 - 5.1 g/dL Final  . Calcium 10/09/2016 9.5  8.6 - 10.4 mg/dL  Final  . GFR, Est African American 10/09/2016 71  >=60 mL/min Final  . GFR, Est Non African American 10/09/2016 62  >=60 mL/min Final  . Uric Acid, Serum 10/09/2016 3.5  2.5 - 7.0 mg/dL Final  . Vit D, 25-Hydroxy 10/09/2016 48  30 - 100 ng/mL Final   Comment: Vitamin D Status           25-OH Vitamin D        Deficiency                <20 ng/mL        Insufficiency         20 - 29 ng/mL        Optimal             > or = 30 ng/mL   For 25-OH Vitamin D testing on patients on D2-supplementation and patients for whom quantitation of D2 and D3 fractions is required, the QuestAssureD 25-OH VIT D, (D2,D3), LC/MS/MS is recommended: order code [id number] (patients > 2 yrs).   . Color, Urine 10/09/2016 DARK YELLOW  YELLOW Final  . APPearance 10/09/2016 CLEAR  CLEAR Final  . Specific Gravity, Urine 10/09/2016 1.019  1.001 - 1.035 Final  . pH 10/09/2016 6.0  5.0 - 8.0 Final  . Glucose, UA 10/09/2016 NEGATIVE  NEGATIVE Final  . Bilirubin Urine 10/09/2016 NEGATIVE  NEGATIVE Final  . Ketones, ur 10/09/2016 NEGATIVE  NEGATIVE Final  . Hgb urine dipstick 10/09/2016 NEGATIVE  NEGATIVE Final  . Protein, ur 10/09/2016 NEGATIVE  NEGATIVE Final  . Nitrite 10/09/2016 NEGATIVE  NEGATIVE Final  . Leukocytes, UA 10/09/2016 NEGATIVE  NEGATIVE Final   Uric acid 3.5  Imaging: No results found.  Speciality Comments: No specialty comments available.    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 Her gout seems to be fairly well controlled and uric acid is in  desirable range. Patient denies any gout flare in long time.  Hyperuricemia - Plan: Uric acid today  Medication monitoring encounter - Uloric 80 mg by mouth daily - Plan: CBC with Differential/Platelet, COMPLETE METABOLIC PANEL WITH GFR  Polyarthralgia: Patient complains of pain in multiple joints. I do not see  any synovitis on examination. Use of Tylenol was discussed.  Trigger finger left thumb: Different treatment options and their side effects were discussed. Patient declined cortisone injection. I've advised her to use Voltaren gel and we will splint her left thumb. If her symptoms get worse she supposed to notify us.   Morbid (severe) obesity due to excess calories ([place]): Weight loss diet and exercise was again emphasized.  Vitamin D deficiency - she's been on supplement Plan: VITAMIN D 25 Hydroxy (Vit-D Deficiency, Fractures)  History of hyperlipidemia: Weight loss will help  History of hypothyroidism  History of coronary artery disease  History of hypertension: Her blood pressure is very high today have advised her to contact her PCP. She should continue to monitor blood pressure closely.  History of atrial fibrillation  Closed fracture of right ankle, sequela  CAD in native artery  OSA on CPAP    Orders: Orders Placed This Encounter  Procedures  . CBC with Differential/Platelet  . COMPLETE METABOLIC PANEL WITH GFR  . Uric acid  . VITAMIN D 25 Hydroxy (Vit-D Deficiency, Fractures)   No orders of the defined types were placed in this encounter.   Face-to-face time spent with patient was 30 minutes. 50% of time was spent in counseling and coordination of care.  Follow-Up Instructions: Return in about 6 months (around 10/10/2017) for Osteoarthritis, Gout.   Bo Merino, MD  Note - This record has been created using Editor, commissioning.  Chart creation errors have been sought, but may not always  have been located. Such creation errors do not reflect on  the standard of medical care.

## 2017-04-06 DIAGNOSIS — G4733 Obstructive sleep apnea (adult) (pediatric): Secondary | ICD-10-CM | POA: Diagnosis not present

## 2017-04-09 ENCOUNTER — Ambulatory Visit (INDEPENDENT_AMBULATORY_CARE_PROVIDER_SITE_OTHER): Payer: PPO | Admitting: Rheumatology

## 2017-04-09 ENCOUNTER — Encounter (INDEPENDENT_AMBULATORY_CARE_PROVIDER_SITE_OTHER): Payer: Self-pay

## 2017-04-09 ENCOUNTER — Encounter: Payer: Self-pay | Admitting: Rheumatology

## 2017-04-09 VITALS — BP 198/85 | HR 64 | Resp 14 | Ht 66.0 in | Wt 222.0 lb

## 2017-04-09 DIAGNOSIS — Z9989 Dependence on other enabling machines and devices: Secondary | ICD-10-CM

## 2017-04-09 DIAGNOSIS — Z8639 Personal history of other endocrine, nutritional and metabolic disease: Secondary | ICD-10-CM

## 2017-04-09 DIAGNOSIS — I2510 Atherosclerotic heart disease of native coronary artery without angina pectoris: Secondary | ICD-10-CM | POA: Diagnosis not present

## 2017-04-09 DIAGNOSIS — Z5181 Encounter for therapeutic drug level monitoring: Secondary | ICD-10-CM | POA: Diagnosis not present

## 2017-04-09 DIAGNOSIS — M2550 Pain in unspecified joint: Secondary | ICD-10-CM

## 2017-04-09 DIAGNOSIS — E559 Vitamin D deficiency, unspecified: Secondary | ICD-10-CM | POA: Diagnosis not present

## 2017-04-09 DIAGNOSIS — E790 Hyperuricemia without signs of inflammatory arthritis and tophaceous disease: Secondary | ICD-10-CM | POA: Diagnosis not present

## 2017-04-09 DIAGNOSIS — S82891S Other fracture of right lower leg, sequela: Secondary | ICD-10-CM

## 2017-04-09 DIAGNOSIS — G4733 Obstructive sleep apnea (adult) (pediatric): Secondary | ICD-10-CM | POA: Diagnosis not present

## 2017-04-09 DIAGNOSIS — M65312 Trigger thumb, left thumb: Secondary | ICD-10-CM | POA: Diagnosis not present

## 2017-04-09 DIAGNOSIS — Z8739 Personal history of other diseases of the musculoskeletal system and connective tissue: Secondary | ICD-10-CM | POA: Diagnosis not present

## 2017-04-09 DIAGNOSIS — M1A09X0 Idiopathic chronic gout, multiple sites, without tophus (tophi): Secondary | ICD-10-CM

## 2017-04-09 DIAGNOSIS — Z8679 Personal history of other diseases of the circulatory system: Secondary | ICD-10-CM

## 2017-04-09 LAB — CBC WITH DIFFERENTIAL/PLATELET
Basophils Absolute: 0 cells/uL (ref 0–200)
Basophils Relative: 0 %
EOS ABS: 180 {cells}/uL (ref 15–500)
EOS PCT: 4 %
HEMATOCRIT: 45.5 % — AB (ref 35.0–45.0)
HEMOGLOBIN: 14.7 g/dL (ref 11.7–15.5)
LYMPHS ABS: 1350 {cells}/uL (ref 850–3900)
Lymphocytes Relative: 30 %
MCH: 28.7 pg (ref 27.0–33.0)
MCHC: 32.3 g/dL (ref 32.0–36.0)
MCV: 88.9 fL (ref 80.0–100.0)
MPV: 9.2 fL (ref 7.5–12.5)
Monocytes Absolute: 585 cells/uL (ref 200–950)
Monocytes Relative: 13 %
NEUTROS ABS: 2385 {cells}/uL (ref 1500–7800)
Neutrophils Relative %: 53 %
Platelets: 290 10*3/uL (ref 140–400)
RBC: 5.12 MIL/uL — ABNORMAL HIGH (ref 3.80–5.10)
RDW: 15.3 % — ABNORMAL HIGH (ref 11.0–15.0)
WBC: 4.5 10*3/uL (ref 3.8–10.8)

## 2017-04-09 LAB — URIC ACID: Uric Acid, Serum: 2 mg/dL — ABNORMAL LOW (ref 2.5–7.0)

## 2017-04-09 LAB — COMPLETE METABOLIC PANEL WITH GFR
ALBUMIN: 4.1 g/dL (ref 3.6–5.1)
ALK PHOS: 93 U/L (ref 33–130)
ALT: 9 U/L (ref 6–29)
AST: 12 U/L (ref 10–35)
BILIRUBIN TOTAL: 0.4 mg/dL (ref 0.2–1.2)
BUN: 18 mg/dL (ref 7–25)
CALCIUM: 9.3 mg/dL (ref 8.6–10.4)
CO2: 25 mmol/L (ref 20–31)
Chloride: 108 mmol/L (ref 98–110)
Creat: 1.06 mg/dL — ABNORMAL HIGH (ref 0.50–0.99)
GFR, EST AFRICAN AMERICAN: 62 mL/min (ref >=60)
GFR, EST NON AFRICAN AMERICAN: 54 mL/min — AB (ref >=60)
Glucose, Bld: 80 mg/dL (ref 65–99)
POTASSIUM: 4.2 mmol/L (ref 3.5–5.3)
SODIUM: 143 mmol/L (ref 135–146)
TOTAL PROTEIN: 7.4 g/dL (ref 6.1–8.1)

## 2017-04-09 MED ORDER — DICLOFENAC SODIUM 1 % TD GEL: 2.0000 g | Freq: Four times a day (QID) | TRANSDERMAL | 0 refills | Status: DC

## 2017-04-10 LAB — VITAMIN D 25 HYDROXY (VIT D DEFICIENCY, FRACTURES): VIT D 25 HYDROXY: 34 ng/mL (ref 30–100)

## 2017-04-10 NOTE — Progress Notes (Signed)
Will monitor

## 2017-05-06 DIAGNOSIS — G4733 Obstructive sleep apnea (adult) (pediatric): Secondary | ICD-10-CM | POA: Diagnosis not present

## 2017-05-22 DIAGNOSIS — Z1231 Encounter for screening mammogram for malignant neoplasm of breast: Secondary | ICD-10-CM | POA: Diagnosis not present

## 2017-06-06 DIAGNOSIS — G4733 Obstructive sleep apnea (adult) (pediatric): Secondary | ICD-10-CM | POA: Diagnosis not present

## 2017-06-18 DIAGNOSIS — G4733 Obstructive sleep apnea (adult) (pediatric): Secondary | ICD-10-CM | POA: Diagnosis not present

## 2017-06-29 ENCOUNTER — Other Ambulatory Visit: Payer: Self-pay | Admitting: Cardiology

## 2017-07-06 DIAGNOSIS — G4733 Obstructive sleep apnea (adult) (pediatric): Secondary | ICD-10-CM | POA: Diagnosis not present

## 2017-07-30 ENCOUNTER — Other Ambulatory Visit: Payer: Self-pay | Admitting: Cardiology

## 2017-07-30 NOTE — Telephone Encounter (Signed)
This is Dr. Kelly's pt. °

## 2017-07-31 ENCOUNTER — Other Ambulatory Visit: Payer: Self-pay | Admitting: Cardiology

## 2017-08-02 ENCOUNTER — Telehealth: Payer: Self-pay | Admitting: Cardiovascular Disease

## 2017-08-02 MED ORDER — METOPROLOL SUCCINATE ER 50 MG PO TB24: ORAL_TABLET | ORAL | 0 refills | Status: DC

## 2017-08-02 NOTE — Telephone Encounter (Signed)
Attempted to contact Pt to notify that the medication has been refill to the requested pharmacy. However, only for 30 days supply to last Pt until scheduled appointment with [name] 08-21-17@ 3pm.   Pt did not answer, left vmail.

## 2017-08-02 NOTE — Telephone Encounter (Signed)
°*  STAT* If patient is at the pharmacy, call can be transferred to refill team.   1. Which medications need to be refilled? (please list name of each medication and dose if known) Metoprolol Succinate 50 mg  2. Which pharmacy/location (including street and city if local pharmacy) is medication to be sent to? [place], [place], [place]  3. Do they need a 30 day or 90 day supply? 90 day  Patient scheduled to see [name] 08-21-17 @3pm

## 2017-08-06 DIAGNOSIS — G4733 Obstructive sleep apnea (adult) (pediatric): Secondary | ICD-10-CM | POA: Diagnosis not present

## 2017-08-13 MED ORDER — METOPROLOL SUCCINATE ER 50 MG PO TB24: 50.0000 mg | ORAL_TABLET | Freq: Every day | ORAL | 1 refills | Status: DC

## 2017-08-13 NOTE — Telephone Encounter (Signed)
New message     *STAT* If patient is at the pharmacy, call can be transferred to refill team.   1. Which medications need to be refilled? (please list name of each medication and dose if known) metoprolol 50 mg  2. Which pharmacy/location (including street and city if local pharmacy) is medication to be sent to? [facility] on [address] [phone]  3. Do they need a 30 day or 90 day supply? 30 day

## 2017-08-13 NOTE — Telephone Encounter (Signed)
Rx(s) sent to pharmacy electronically.  

## 2017-08-21 ENCOUNTER — Encounter: Payer: Self-pay | Admitting: Physician Assistant

## 2017-08-21 ENCOUNTER — Ambulatory Visit (INDEPENDENT_AMBULATORY_CARE_PROVIDER_SITE_OTHER): Payer: PPO | Admitting: Physician Assistant

## 2017-08-21 VITALS — BP 178/86 | HR 58 | Ht 66.0 in | Wt 227.2 lb

## 2017-08-21 DIAGNOSIS — I10 Essential (primary) hypertension: Secondary | ICD-10-CM

## 2017-08-21 DIAGNOSIS — E790 Hyperuricemia without signs of inflammatory arthritis and tophaceous disease: Secondary | ICD-10-CM | POA: Diagnosis not present

## 2017-08-21 DIAGNOSIS — E039 Hypothyroidism, unspecified: Secondary | ICD-10-CM

## 2017-08-21 DIAGNOSIS — Z79899 Other long term (current) drug therapy: Secondary | ICD-10-CM

## 2017-08-21 DIAGNOSIS — I2510 Atherosclerotic heart disease of native coronary artery without angina pectoris: Secondary | ICD-10-CM | POA: Diagnosis not present

## 2017-08-21 DIAGNOSIS — E7800 Pure hypercholesterolemia, unspecified: Secondary | ICD-10-CM

## 2017-08-21 DIAGNOSIS — I480 Paroxysmal atrial fibrillation: Secondary | ICD-10-CM

## 2017-08-21 MED ORDER — CHLORTHALIDONE 25 MG PO TABS: 25.0000 mg | ORAL_TABLET | Freq: Every day | ORAL | 3 refills | Status: DC

## 2017-08-21 NOTE — Patient Instructions (Signed)
Medication Instructions:  START chlorthalidone 25mg  daily  Labwork: Please return in 1 WEEK for FASTING blood work (CMET, CBC, Lipid panel, Hepatic, Uric Acid)  Testing/Procedures: NONE  Follow-Up: Your physician recommends that you schedule a follow-up appointment in: 3-4 weeks with [name] PA   Any Other Special Instructions Will Be Listed Below (If Applicable).     If you need a refill on your cardiac medications before your next appointment, please call your pharmacy.

## 2017-08-21 NOTE — Progress Notes (Signed)
Cardiology Office Note    Date:  08/22/2017   ID:  Claudia Cantrell, DOB [birth year], MRN [id number]  PCP:  Patient, [name]  Cardiologist:  Dr. Claiborne Billings  Chief Complaint  Patient presents with  . Follow-up    seen for Dr. Claiborne Billings    History of Present Illness:  Claudia Cantrell is a 71 y.o. female with PMH of CAD, HTN, HLD, OSA, hypothyroidism, NICM and PAF. She had remote myocardial infarction in 1991 and 1992, she underwent PTCA by Dr. Eustace Quail. In October 2011, she underwent cardiac catheterization which showed preserved LV function, very small focal area of severe hypo-contractility involving the mid inferior wall as well as posterior wall, at the time she did not have significant obstructive disease, she only had 20% narrowing at the ostium of LAD, 20% in mid left circumflex, 20% mid RCA, and 20% posterior lateral irregularities. She had a history of multinodular goiter in 2014 underwent thyroidectomy. She was admitted in August 2017 with rapid atrial fibrillation. She was started on eliquis at the time. She underwent successful TEE guided cardioversion with restoration of sinus rhythm. Echocardiogram obtained in the setting of A. fib showed EF 40-45%. She underwent Myoview in August 2017 which was low risk with normal EF and suggestive possibility of small basal and mid inferolateral defect, no ischemia.  She presents today for cardiology office visit. She denies any chest pain, she does have some degree of dyspnea which is unchanged. Her blood pressure has been quite elevated. She has a history of hands and feet swelling which has resolved. We discussed several options, amlodipine may cause peripheral swelling, therefore will be less ideal in this case. We eventually agreed to start on chlorthalidone 25 mg daily. She will need a basic metabolic panel in one week to assess her renal function and electrolytes. I will bring her back in a few weeks to up titrate the medication. Otherwise she  does not have any significant lower extremity edema, orthopnea or PND. Her previous primary care provider has retired, she has not started on Uloric which was prescribed to her by her previous primary care provider. She is asking [place] to check all her labs. I recommended a CMP, CBC, fasting lipid panel, and uric acid level. She is aware she will need to establish with PCP as soon as possible.   Past Medical History:  Diagnosis Date  . Arthritis    rheumatoid  . CAD (coronary artery disease)    a. 1992 s/p MI and PTCA of unknown vessel;  b. 09/2010 Cath: LM nl, LAD 20p, LCX 62m, RCA 75m, RPL 20.  . Cancer ([place])   . Family history of anesthesia complication    daughter has difficulty waking   . GERD (gastroesophageal reflux disease)    occ  . Gout 10/08/2016  . Hyperlipidemia   . Hypertensive heart disease   . Nonischemic cardiomyopathy ([place])    a. 07/2016 Echo: EF 40-45%, mild LVH, inferior akinesis, moderately dilated left atrium, trivial AI and MR.  Marland Kitchen PAF (paroxysmal atrial fibrillation) ([place])    a. 07/2016 Admitted w/ AF RVR-->CHA2DS2VASc = 5-->Eliquis;  b. 07/2016 successful TEE/DCCV.  Marland Kitchen Sleep apnea    a. Using CPAP.    Past Surgical History:  Procedure Laterality Date  . ABDOMINAL HYSTERECTOMY  1988  . BACK SURGERY  1994  . CARDIAC CATHETERIZATION  1991 AND 1992   PTCA BY DR Lamount Cohen  . CARDIOVERSION N/A 08/01/2016   Procedure: CARDIOVERSION;  Surgeon: Lelon Perla, MD;  Location: [facility] ENDOSCOPY;  Service: Cardiovascular;  Laterality: N/A;  . ORIF ANKLE FRACTURE Right 04/27/2015   Procedure: OPEN REDUCTION INTERNAL FIXATION (ORIF) RIGHT BIMALLEOLAR ANKLE FRACTURE WITH SYNDESMOSIS FIXATION;  Surgeon: Leandrew Koyanagi, MD;  Location: [place];  Service: Orthopedics;  Laterality: Right;  . TEE WITHOUT CARDIOVERSION N/A 08/01/2016   Procedure: TRANSESOPHAGEAL ECHOCARDIOGRAM (TEE);  Surgeon: Lelon Perla, MD;  Location: [facility] ENDOSCOPY;  Service: Cardiovascular;  Laterality: N/A;  .  THYROIDECTOMY  11/24/2013   DR Dalbert Batman  . THYROIDECTOMY N/A 11/24/2013   Procedure: TOTAL THYROIDECTOMY;  Surgeon: Adin Hector, MD;  Location: [place];  Service: General;  Laterality: N/A;  . TRANSTHORACIC ECHOCARDIOGRAM  01/15/2013   EF 55% TO 65%. PROBABLE MILD HYPOKINESIS OF THE INFERIOR MYOCARDIUM. GRADE 1 DIASTOLIC DYSFUNCTION. TRIAL AR.LA IS MILDLY DILATED.    Current Medications: Outpatient Medications Prior to Visit  Medication Sig Dispense Refill  . Acetaminophen (TYLENOL EXTRA STRENGTH PO) Take 2 tablets by mouth as needed.    Marland Kitchen apixaban (ELIQUIS) 5 MG TABS tablet Take 1 tablet (5 mg total) by mouth 2 (two) times daily. 60 tablet 0  . Cholecalciferol (VITAMIN D-3 PO) Take 2,000 Units by mouth daily.     . diclofenac sodium (VOLTAREN) 1 % GEL Apply 2 g topically 4 (four) times daily. 3 Tube 0  . docusate sodium (COLACE) 100 MG capsule Take 100 mg by mouth 2 (two) times daily.    Marland Kitchen ELIQUIS 5 MG TABS tablet TAKE ONE TABLET BY MOUTH TWICE DAILY 60 tablet 5  . metoprolol succinate (TOPROL-XL) 50 MG 24 hr tablet Take 1 tablet (50 mg total) by mouth daily. Take with or immediately following a meal. 30 tablet 1  . Probiotic Product (PROBIOTIC-10) CAPS Take 1 capsule by mouth daily.    . rosuvastatin (CRESTOR) 20 MG tablet Take 1 tablet (20 mg total) by mouth daily. 90 tablet 3  . SYNTHROID 125 MCG tablet TAKE ONE TABLET BY MOUTH ONCE DAILY 30 tablet 0  . aspirin EC 81 MG tablet Take 81 mg by mouth daily.    . betamethasone dipropionate (DIPROLENE) 0.05 % ointment Apply topically 2 (two) times daily. 15 g 0  . Biotin 10000 MCG TABS Take 1 capsule by mouth daily.    . febuxostat (ULORIC) 40 MG tablet Take 80 mg by mouth daily.     Marland Kitchen ULORIC 80 MG TABS TAKE ONE TABLET BY MOUTH ONCE DAILY 90 tablet 1   No facility-administered medications prior to visit.      Allergies:   Codeine   Social History   Social History  . Marital status: Divorced    Spouse name: N/A  . Number of  children: N/A  . Years of education: N/A   Social History Main Topics  . Smoking status: Former Smoker    Packs/day: 1.00    Years: 15.00    Types: Cigarettes    Quit date: 12/10/1990  . Smokeless tobacco: Never Used     Comment: occ alcohol  . Alcohol use Yes     Comment: very rare  . Drug use: No  . Sexual activity: Not Asked   Other Topics Concern  . None   Social History Narrative  . None     Family History:  The patient's family history includes Bone cancer in her sister; Diabetes in her father; Heart disease in her brother and mother; Melanoma in her brother; Stroke in her father; Sudden death in her  brother and mother.   ROS:   Please see the history of present illness.    ROS All other systems reviewed and are negative.   PHYSICAL EXAM:   VS:  BP (!) 178/86   Pulse (!) 58   Ht 5\' 6"  (1.676 m)   Wt 227 lb 3.2 oz (103.1 kg)   BMI 36.67 kg/m    GEN: Well nourished, well developed, in no acute distress  HEENT: normal  Neck: no JVD, carotid bruits, or masses Cardiac: RRR; no murmurs, rubs, or gallops,no edema  Respiratory:  clear to auscultation bilaterally, normal work of breathing GI: soft, nontender, nondistended, + BS MS: no deformity or atrophy  Skin: warm and dry, no rash Neuro:  Alert and Oriented x 3, Strength and sensation are intact Psych: euthymic mood, full affect  Wt Readings from Last 3 Encounters:  08/21/17 227 lb 3.2 oz (103.1 kg)  04/09/17 222 lb (100.7 kg)  01/03/17 224 lb (101.6 kg)      Studies/Labs Reviewed:   EKG:  EKG is ordered today.  The ekg ordered today demonstrates Normal sinus rhythm inferolateral T-wave inversion.  Recent Labs: 04/09/2017: ALT 9; BUN 18; Creat 1.06; Hemoglobin 14.7; Platelets 290; Potassium 4.2; Sodium 143   Lipid Panel    Component Value Date/Time   CHOL 138 07/30/2016 0550   TRIG 131 07/30/2016 0550   HDL 31 (L) 07/30/2016 0550   CHOLHDL 4.5 07/30/2016 0550   VLDL 26 07/30/2016 0550   LDLCALC 81  07/30/2016 0550    Additional studies/ records that were reviewed today include:   Myoview 08/09/2016 Study Highlights     The left ventricular ejection fraction is normal (55-65%).  Nuclear stress EF: 55%.  There was no ST segment deviation noted during stress.  Defect 1: There is a small defect of severe severity present in the basal inferolateral and mid inferolateral location.  Findings consistent with prior myocardial infarction.  This is a low risk study.   There is a small area, severe severity irreversible perfusion defect in the basal and mid inferolateral walls consistent an infarct in the LCX territory. No ischemia.       ASSESSMENT:    1. CAD in native artery   2. Hyperuricemia   3. Pure hypercholesterolemia   4. Uncontrolled hypertension   5. Medication management   6. Hypothyroidism, unspecified type   7. PAF (paroxysmal atrial fibrillation) (HCC)      PLAN:  In order of problems listed above:  1. CAD: Nonobstructive disease on previous cath, negative Myoview in 2017, she does not have any chest pain  2. Hypertension: Uncontrolled. We discussed various medications, eventually we agreed to start on chlorthalidone 25 mg daily. I will bring her back in a few weeks for medication titration  3. Hyperlipidemia: On Crestor 20 mg daily, she will need a fasting lipid panel. Her primary care provider has retired  4. Hypothyroidism: On Synthroid, she will need to establish with a new primary care provider: Check TSH  5. PAF: Check CBC. On eliquis, no bleeding issues. Maintaining sinus rhythm. Continue on current dose of metoprolol. Unable to up titrate due to baseline bradycardia.    Medication Adjustments/Labs and Tests Ordered: Current medicines are reviewed at length with the patient today.  Concerns regarding medicines are outlined above.  Medication changes, Labs and Tests ordered today are listed in the Patient Instructions below. Patient Instructions    Medication Instructions:  START chlorthalidone 25mg  daily  Labwork: Please return in 1  WEEK for FASTING blood work (CMET, CBC, Lipid panel, Hepatic, Uric Acid)  Testing/Procedures: NONE  Follow-Up: Your physician recommends that you schedule a follow-up appointment in: 3-4 weeks with [name] PA   Any Other Special Instructions Will Be Listed Below (If Applicable).     If you need a refill on your cardiac medications before your next appointment, please call your pharmacy.      Hilbert Corrigan, [place]  08/22/2017 6:29 AM    [facility] [place], [place], [place]  [address] Phone: [phone]; Fax: [phone]

## 2017-08-22 ENCOUNTER — Encounter: Payer: Self-pay | Admitting: Physician Assistant

## 2017-08-22 ENCOUNTER — Other Ambulatory Visit: Payer: Self-pay | Admitting: *Deleted

## 2017-08-22 DIAGNOSIS — I480 Paroxysmal atrial fibrillation: Secondary | ICD-10-CM

## 2017-08-27 DIAGNOSIS — I2510 Atherosclerotic heart disease of native coronary artery without angina pectoris: Secondary | ICD-10-CM | POA: Diagnosis not present

## 2017-08-27 DIAGNOSIS — E7800 Pure hypercholesterolemia, unspecified: Secondary | ICD-10-CM | POA: Diagnosis not present

## 2017-08-27 DIAGNOSIS — I10 Essential (primary) hypertension: Secondary | ICD-10-CM | POA: Diagnosis not present

## 2017-08-27 DIAGNOSIS — E790 Hyperuricemia without signs of inflammatory arthritis and tophaceous disease: Secondary | ICD-10-CM | POA: Diagnosis not present

## 2017-08-27 DIAGNOSIS — Z79899 Other long term (current) drug therapy: Secondary | ICD-10-CM | POA: Diagnosis not present

## 2017-08-27 DIAGNOSIS — I480 Paroxysmal atrial fibrillation: Secondary | ICD-10-CM | POA: Diagnosis not present

## 2017-08-28 LAB — CBC
Hematocrit: 46.3 % (ref 34.0–46.6)
Hemoglobin: 15.6 g/dL (ref 11.1–15.9)
MCH: 29.3 pg (ref 26.6–33.0)
MCHC: 33.7 g/dL (ref 31.5–35.7)
MCV: 87 fL (ref 79–97)
Platelets: 268 x10E3/uL (ref 150–379)
RBC: 5.33 x10E6/uL — ABNORMAL HIGH (ref 3.77–5.28)
RDW: 14.7 % (ref 12.3–15.4)
WBC: 3.1 x10E3/uL — ABNORMAL LOW (ref 3.4–10.8)

## 2017-08-28 LAB — TSH: TSH: 0.457 u[IU]/mL (ref 0.450–4.500)

## 2017-08-28 LAB — COMPREHENSIVE METABOLIC PANEL
ALBUMIN: 4.2 g/dL (ref 3.6–4.8)
ALK PHOS: 128 IU/L — AB (ref 39–117)
ALT: 13 IU/L (ref 0–32)
AST: 19 IU/L (ref 0–40)
Albumin/Globulin Ratio: 1.2 (ref 1.2–2.2)
BUN / CREAT RATIO: 14 (ref 12–28)
BUN: 16 mg/dL (ref 8–27)
Bilirubin Total: 0.5 mg/dL (ref 0.0–1.2)
CALCIUM: 9.5 mg/dL (ref 8.7–10.3)
CO2: 26 mmol/L (ref 20–29)
CREATININE: 1.15 mg/dL — AB (ref 0.57–1.00)
Chloride: 99 mmol/L (ref 96–106)
GFR calc non Af Amer: 49 mL/min/{1.73_m2} — ABNORMAL LOW (ref >59)
GFR, EST AFRICAN AMERICAN: 56 mL/min/{1.73_m2} — AB (ref >59)
GLOBULIN, TOTAL: 3.4 g/dL (ref 1.5–4.5)
Glucose: 93 mg/dL (ref 65–99)
Potassium: 3.4 mmol/L — ABNORMAL LOW (ref 3.5–5.2)
SODIUM: 140 mmol/L (ref 134–144)
Total Protein: 7.6 g/dL (ref 6.0–8.5)

## 2017-08-28 LAB — LIPID PANEL
Chol/HDL Ratio: 7.9 ratio — ABNORMAL HIGH (ref 0.0–4.4)
Cholesterol, Total: 253 mg/dL — ABNORMAL HIGH (ref 100–199)
HDL: 32 mg/dL — ABNORMAL LOW
LDL Calculated: 146 mg/dL — ABNORMAL HIGH (ref 0–99)
Triglycerides: 373 mg/dL — ABNORMAL HIGH (ref 0–149)
VLDL Cholesterol Cal: 75 mg/dL — ABNORMAL HIGH (ref 5–40)

## 2017-08-28 LAB — URIC ACID: URIC ACID: 10.4 mg/dL — AB (ref 2.5–7.1)

## 2017-08-28 NOTE — Progress Notes (Signed)
TSH normal

## 2017-08-28 NOTE — Progress Notes (Signed)
Kidney function and electrolyte stable. Red blood cell count stable. Cholesterol very much uncontrolled. Please check with patient to see if she is taking Crestor, if she is, she will need to increase crestor to 40mg  daily and repeat FLT and LFT in 3 month. Uric acid level high, at risk for gout flare.

## 2017-08-29 ENCOUNTER — Telehealth: Payer: Self-pay | Admitting: *Deleted

## 2017-08-29 NOTE — Telephone Encounter (Signed)
I discussed w/ patient. She voiced non-compliance w crestor - states she is only good about taking her BP and thyroid medication. After a lengthy discussion, pt vocied she will restart this medication today and f/u as scheduled w [place] in 2 weeks.  Regarding the elevated uric acid, she had also stopped her gout medication. I recommended she call rheumatologist office who prescribed and get instructions from them for restarting medication.  Pt verbalized understanding and thanks. She is aware to call if new concerns prior to next visit.

## 2017-08-29 NOTE — Telephone Encounter (Signed)
-----   Message from [place], [place] sent at 08/28/2017  5:19 PM EDT ----- Kidney function and electrolyte stable. Red blood cell count stable. Cholesterol very much uncontrolled. Please check with patient to see if she is taking Crestor, if she is, she will need to increase crestor to 40mg  daily and repeat FLT and LFT in 3  month. Uric acid level high, at risk for gout flare.

## 2017-09-06 DIAGNOSIS — G4733 Obstructive sleep apnea (adult) (pediatric): Secondary | ICD-10-CM | POA: Diagnosis not present

## 2017-09-11 ENCOUNTER — Encounter: Payer: Self-pay | Admitting: Physician Assistant

## 2017-09-11 ENCOUNTER — Ambulatory Visit (INDEPENDENT_AMBULATORY_CARE_PROVIDER_SITE_OTHER): Payer: PPO | Admitting: Physician Assistant

## 2017-09-11 VITALS — BP 170/106 | HR 60 | Ht 66.0 in | Wt 220.0 lb

## 2017-09-11 DIAGNOSIS — I10 Essential (primary) hypertension: Secondary | ICD-10-CM

## 2017-09-11 DIAGNOSIS — E785 Hyperlipidemia, unspecified: Secondary | ICD-10-CM

## 2017-09-11 DIAGNOSIS — I480 Paroxysmal atrial fibrillation: Secondary | ICD-10-CM

## 2017-09-11 DIAGNOSIS — I2510 Atherosclerotic heart disease of native coronary artery without angina pectoris: Secondary | ICD-10-CM | POA: Diagnosis not present

## 2017-09-11 DIAGNOSIS — Z79899 Other long term (current) drug therapy: Secondary | ICD-10-CM | POA: Diagnosis not present

## 2017-09-11 DIAGNOSIS — E039 Hypothyroidism, unspecified: Secondary | ICD-10-CM

## 2017-09-11 MED ORDER — SYNTHROID 125 MCG PO TABS: 125.0000 ug | ORAL_TABLET | Freq: Every day | ORAL | 0 refills | Status: DC

## 2017-09-11 MED ORDER — ROSUVASTATIN CALCIUM 40 MG PO TABS: 40.0000 mg | ORAL_TABLET | Freq: Every day | ORAL | 3 refills | Status: DC

## 2017-09-11 NOTE — Patient Instructions (Addendum)
Medication Instructions: Claudia Deforest, PA has recommended making the following medication changes: 1. INCREASE Rosuvastatin (Crestor) to 40 mg daily  **Claudia Cantrell has refilled a 30-day supply of your Synthroid. Please discuss future refills with your primary care physician or endocrinologist.  Labwork: Your physician recommends that you return for lab work TODAY and again in 2-3 months - FASTING.  Testing/Procedures: NONE ORDERED  Follow-up: Your physician recommends that you schedule a follow-up appointment in 1 month with [facility].  Claudia Cantrell recommends that you schedule a follow-up appointment in 3-4 months with Dr Claiborne Billings.  If you need a refill on your cardiac medications before your next appointment, please call your pharmacy.

## 2017-09-11 NOTE — Progress Notes (Signed)
Cardiology Office Note    Date:  09/13/2017   ID:  Claudia Cantrell, DOB 19-Oct-1946, MRN [id number]  PCP:  Patient, [name]  Cardiologist:  Dr. Claiborne Billings   Chief Complaint  Patient presents with  . Follow-up    seen for Dr. Claiborne Billings    History of Present Illness:  Claudia Cantrell is a 71 y.o. female with PMH of CAD, HTN, HLD, OSA, hypothyroidism, NICM and PAF. She had remote myocardial infarction in 1991 and 1992, she underwent PTCA by Dr. Eustace Quail. In October 2011, she underwent cardiac catheterization which showed preserved LV function, very small focal area of severe hypo-contractility involving the mid inferior wall as well as posterior wall, at the time she did not have significant obstructive disease, she only had 20% narrowing at the ostium of LAD, 20% in mid left circumflex, 20% mid RCA, and 20% posterior lateral irregularities. She had a history of multinodular goiter in 2014 underwent thyroidectomy. She was admitted in August 2017 with rapid atrial fibrillation. She was started on eliquis at the time. She underwent successful TEE guided cardioversion with restoration of sinus rhythm. Echocardiogram obtained in the setting of A. fib showed EF 40-45%. She underwent Myoview in August 2017 which was low risk with normal EF and suggestive possibility of small basal and mid inferolateral defect, no ischemia.  I last saw the patient on 08/21/2017, she had elevated blood pressure at the time. She also complained of occasional hands and feet swelling as well. I added chlorthalidone 25 mg daily. Lab work obtained on 08/27/2017 showed uncontrolled cholesterol, LDL 146, triglycerides 273. Creatinine trended up slightly more potassium was borderline low. TSH normal. Despite her elevated blood pressure in the office today, her blood pressure at home mainly maintains in the 110 to 120s range. I will continue on current medication, I am concerned that her potassium is trending down, I will obtain a basic  metabolic panel today. As far as her cholesterol, it is very much uncontrolled. She is not compliant with Crestor, however after understanding how high her cholesterol is, she is willing to be more compliant. I will increase Crestor to 40 mg daily. She will need a fasting lipid panel and liver function test in 6-8 weeks. She also requested a one-month supply of Synthroid as she has not received a refill from her endocrinologist, I will give her a one-month supply only without refill, if she require more she will need to discuss with her endocrinologist. She will see me in a month and see Dr. Claiborne Billings in 3-4 month.   Past Medical History:  Diagnosis Date  . Arthritis    rheumatoid  . CAD (coronary artery disease)    a. 1992 s/p MI and PTCA of unknown vessel;  b. 09/2010 Cath: LM nl, LAD 20p, LCX 61m, RCA 96m, RPL 20.  . Cancer ([place])   . Family history of anesthesia complication    daughter has difficulty waking   . GERD (gastroesophageal reflux disease)    occ  . Gout 10/08/2016  . Hyperlipidemia   . Hypertensive heart disease   . Nonischemic cardiomyopathy ([place])    a. 07/2016 Echo: EF 40-45%, mild LVH, inferior akinesis, moderately dilated left atrium, trivial AI and MR.  Marland Kitchen PAF (paroxysmal atrial fibrillation) ([place])    a. 07/2016 Admitted w/ AF RVR-->CHA2DS2VASc = 5-->Eliquis;  b. 07/2016 successful TEE/DCCV.  Marland Kitchen Sleep apnea    a. Using CPAP.    Past Surgical History:  Procedure Laterality Date  .  ABDOMINAL HYSTERECTOMY  1988  . BACK SURGERY  1994  . CARDIAC CATHETERIZATION  1991 AND 1992   PTCA BY DR Lamount Cohen  . CARDIOVERSION N/A 08/01/2016   Procedure: CARDIOVERSION;  Surgeon: Lelon Perla, MD;  Location: [facility] ENDOSCOPY;  Service: Cardiovascular;  Laterality: N/A;  . ORIF ANKLE FRACTURE Right 04/27/2015   Procedure: OPEN REDUCTION INTERNAL FIXATION (ORIF) RIGHT BIMALLEOLAR ANKLE FRACTURE WITH SYNDESMOSIS FIXATION;  Surgeon: Leandrew Koyanagi, MD;  Location: [place];  Service:  Orthopedics;  Laterality: Right;  . TEE WITHOUT CARDIOVERSION N/A 08/01/2016   Procedure: TRANSESOPHAGEAL ECHOCARDIOGRAM (TEE);  Surgeon: Lelon Perla, MD;  Location: [facility] ENDOSCOPY;  Service: Cardiovascular;  Laterality: N/A;  . THYROIDECTOMY  11/24/2013   DR Dalbert Batman  . THYROIDECTOMY N/A 11/24/2013   Procedure: TOTAL THYROIDECTOMY;  Surgeon: Adin Hector, MD;  Location: [place];  Service: General;  Laterality: N/A;  . TRANSTHORACIC ECHOCARDIOGRAM  01/15/2013   EF 55% TO 65%. PROBABLE MILD HYPOKINESIS OF THE INFERIOR MYOCARDIUM. GRADE 1 DIASTOLIC DYSFUNCTION. TRIAL AR.LA IS MILDLY DILATED.    Current Medications: Outpatient Medications Prior to Visit  Medication Sig Dispense Refill  . Acetaminophen (TYLENOL EXTRA STRENGTH PO) Take 2 tablets by mouth as needed.    Marland Kitchen apixaban (ELIQUIS) 5 MG TABS tablet Take 1 tablet (5 mg total) by mouth 2 (two) times daily. 60 tablet 0  . Biotin 10000 MCG TABS Take 1 tablet by mouth daily.    . chlorthalidone (HYGROTON) 25 MG tablet Take 1 tablet (25 mg total) by mouth daily. 90 tablet 3  . Cholecalciferol (VITAMIN D-3 PO) Take 2,000 Units by mouth daily.     Marland Kitchen Cod Liver Oil 1000 MG CAPS Take 1 capsule by mouth daily.    . diclofenac sodium (VOLTAREN) 1 % GEL Apply 2 g topically 4 (four) times daily. 3 Tube 0  . ELIQUIS 5 MG TABS tablet TAKE ONE TABLET BY MOUTH TWICE DAILY 60 tablet 5  . metoprolol succinate (TOPROL-XL) 50 MG 24 hr tablet Take 1 tablet (50 mg total) by mouth daily. Take with or immediately following a meal. 30 tablet 1  . Probiotic Product (PROBIOTIC-10) CAPS Take 1 capsule by mouth daily.    . rosuvastatin (CRESTOR) 20 MG tablet Take 1 tablet (20 mg total) by mouth daily. 90 tablet 3  . SYNTHROID 125 MCG tablet TAKE ONE TABLET BY MOUTH ONCE DAILY 30 tablet 0  . docusate sodium (COLACE) 100 MG capsule Take 100 mg by mouth 2 (two) times daily.     No facility-administered medications prior to visit.      Allergies:   Codeine   Social  History   Social History  . Marital status: Divorced    Spouse name: N/A  . Number of children: N/A  . Years of education: N/A   Social History Main Topics  . Smoking status: Former Smoker    Packs/day: 1.00    Years: 15.00    Types: Cigarettes    Quit date: 12/10/1990  . Smokeless tobacco: Never Used     Comment: occ alcohol  . Alcohol use Yes     Comment: very rare  . Drug use: No  . Sexual activity: Not Asked   Other Topics Concern  . None   Social History Narrative  . None     Family History:  The patient's family history includes Bone cancer in her sister; Diabetes in her father; Heart disease in her brother and mother; Melanoma in her brother; Stroke in  her father; Sudden death in her brother and mother.   ROS:   Please see the history of present illness.    ROS All other systems reviewed and are negative.   PHYSICAL EXAM:   VS:  BP (!) 170/106   Pulse 60   Ht 5\' 6"  (1.676 m)   Wt 220 lb (99.8 kg)   BMI 35.51 kg/m    GEN: Well nourished, well developed, in no acute distress  HEENT: normal  Neck: no JVD, carotid bruits, or masses Cardiac: RRR; no murmurs, rubs, or gallops,no edema  Respiratory:  clear to auscultation bilaterally, normal work of breathing GI: soft, nontender, nondistended, + BS MS: no deformity or atrophy  Skin: warm and dry, no rash Neuro:  Alert and Oriented x 3, Strength and sensation are intact Psych: euthymic mood, full affect  Wt Readings from Last 3 Encounters:  09/11/17 220 lb (99.8 kg)  08/21/17 227 lb 3.2 oz (103.1 kg)  04/09/17 222 lb (100.7 kg)      Studies/Labs Reviewed:   EKG:  EKG is not ordered today.    Recent Labs: 08/27/2017: ALT 13; Hemoglobin 15.6; Platelets 268; TSH 0.457 09/11/2017: BUN 15; Creatinine, Ser 0.75; Potassium 3.2; Sodium 140   Lipid Panel    Component Value Date/Time   CHOL 253 (H) 08/27/2017 1130   TRIG 373 (H) 08/27/2017 1130   HDL 32 (L) 08/27/2017 1130   CHOLHDL 7.9 (H) 08/27/2017 1130     CHOLHDL 4.5 07/30/2016 0550   VLDL 26 07/30/2016 0550   LDLCALC 146 (H) 08/27/2017 1130    Additional studies/ records that were reviewed today include:   Myoview 08/09/2016 Study Highlights     The left ventricular ejection fraction is normal (55-65%).  Nuclear stress EF: 55%.  There was no ST segment deviation noted during stress.  Defect 1: There is a small defect of severe severity present in the basal inferolateral and mid inferolateral location.  Findings consistent with prior myocardial infarction.  This is a low risk study.  There is a small area, severe severity irreversible perfusion defect in the basal and mid inferolateral walls consistent an infarct in the LCX territory. No ischemia.       ASSESSMENT:    1. Coronary artery disease involving native coronary artery of native heart without angina pectoris   2. Medication management   3. Dyslipidemia   4. Essential hypertension   5. Hypothyroidism, unspecified type   6. PAF (paroxysmal atrial fibrillation) (HCC)      PLAN:  In order of problems listed above:  1. CHB: Nonobstructive disease on previous cardiac catheterization, negative Myoview in 2017. Denies any chest pain.   2. Hypertension: Although blood pressure is high in the office today, home blood pressure reading shows her systolic blood pressure mainly in the 110 to 120s range. I added chlorthalidone 25 mg daily during the last office visit. Recent lab work shows potassium was trending down, will obtain basic metabolic panel today and potentially add potassium supplement if potassium is low.  3. Hyperlipidemia: Lipid test shows her cholesterol is very much uncontrolled, will add Crestor 40 mg daily. Obtain fasting lipid panel in 2-3 months.  4. Hypothyroidism: She requested a 30 day supply of Synthroid as her endocrinologist has not seen any new prescription. I encouraged her to follow-up with her endocrinologist.  5. PAF: On eliquis, no  bleeding issues. Maintaining sinus rhythm based on physical exam.    Medication Adjustments/Labs and Tests Ordered: Current medicines are reviewed  at length with the patient today.  Concerns regarding medicines are outlined above.  Medication changes, Labs and Tests ordered today are listed in the Patient Instructions below. Patient Instructions  Medication Instructions: [name], PA has recommended making the following medication changes: 1. INCREASE Rosuvastatin (Crestor) to 40 mg daily  **Isaac Laud has refilled a 30-day supply of your Synthroid. Please discuss future refills with your primary care physician or endocrinologist.  Labwork: Your physician recommends that you return for lab work TODAY and again in 2-3 months - FASTING.  Testing/Procedures: NONE ORDERED  Follow-up: Your physician recommends that you schedule a follow-up appointment in 1 month with [facility]  Isaac Laud recommends that you schedule a follow-up appointment in 3-4 months with Dr Claiborne Billings.  If you need a refill on your cardiac medications before your next appointment, please call your pharmacy.    Hilbert Corrigan, [place]  09/13/2017 12:48 PM    [address], [place], [place]  [address] Phone: [phone]; Fax: [phone]

## 2017-09-12 ENCOUNTER — Telehealth: Payer: Self-pay | Admitting: *Deleted

## 2017-09-12 DIAGNOSIS — Z79899 Other long term (current) drug therapy: Secondary | ICD-10-CM

## 2017-09-12 LAB — BASIC METABOLIC PANEL
BUN/Creatinine Ratio: 20 (ref 12–28)
BUN: 15 mg/dL (ref 8–27)
CALCIUM: 9.6 mg/dL (ref 8.7–10.3)
CO2: 25 mmol/L (ref 20–29)
CREATININE: 0.75 mg/dL (ref 0.57–1.00)
Chloride: 98 mmol/L (ref 96–106)
GFR calc Af Amer: 94 mL/min/{1.73_m2} (ref >59)
GFR, EST NON AFRICAN AMERICAN: 82 mL/min/{1.73_m2} (ref >59)
Glucose: 127 mg/dL — ABNORMAL HIGH (ref 65–99)
Potassium: 3.2 mmol/L — ABNORMAL LOW (ref 3.5–5.2)
SODIUM: 140 mmol/L (ref 134–144)

## 2017-09-12 MED ORDER — POTASSIUM CHLORIDE ER 20 MEQ PO TBCR: 20.0000 meq | EXTENDED_RELEASE_TABLET | Freq: Every day | ORAL | 5 refills | Status: DC

## 2017-09-12 NOTE — Progress Notes (Signed)
Start on 79meq daily of KCl for low potassium, recheck BMET in 7 days.

## 2017-09-12 NOTE — Telephone Encounter (Signed)
-----   Message from [place], [place] sent at 09/12/2017  8:43 AM EDT ----- Start on 41meq daily of KCl for low potassium, recheck BMET in 7 days.

## 2017-09-12 NOTE — Telephone Encounter (Signed)
Pt called back, apologetic bc she thought I was a robocall/scam/spam caller - she had gotten 3 message in last 10 mins from solicitors. I urged pt no apology needed, thanked her for calling back and letting us know what had happened. Results and recommendations discussed. She was receptive to instruction/rationale for starting medication and rechecking labwork. Rx sent to [facility] at her request, BMET ordered for recheck in 1 week.

## 2017-09-13 ENCOUNTER — Encounter: Payer: Self-pay | Admitting: Physician Assistant

## 2017-09-20 DIAGNOSIS — Z79899 Other long term (current) drug therapy: Secondary | ICD-10-CM | POA: Diagnosis not present

## 2017-09-20 DIAGNOSIS — E785 Hyperlipidemia, unspecified: Secondary | ICD-10-CM | POA: Diagnosis not present

## 2017-09-22 LAB — HEPATIC FUNCTION PANEL
ALT: 14 IU/L (ref 0–32)
AST: 19 IU/L (ref 0–40)
Albumin: 4.5 g/dL (ref 3.6–4.8)
Alkaline Phosphatase: 132 IU/L — ABNORMAL HIGH (ref 39–117)
BILIRUBIN, DIRECT: 0.12 mg/dL (ref 0.00–0.40)
Bilirubin Total: 0.4 mg/dL (ref 0.0–1.2)
TOTAL PROTEIN: 8.1 g/dL (ref 6.0–8.5)

## 2017-09-22 LAB — LIPID PANEL
CHOLESTEROL TOTAL: 177 mg/dL (ref 100–199)
Chol/HDL Ratio: 5.2 ratio — ABNORMAL HIGH (ref 0.0–4.4)
HDL: 34 mg/dL — ABNORMAL LOW (ref >39)
LDL CALC: 89 mg/dL (ref 0–99)
Triglycerides: 269 mg/dL — ABNORMAL HIGH (ref 0–149)
VLDL CHOLESTEROL CAL: 54 mg/dL — AB (ref 5–40)

## 2017-09-22 LAB — BASIC METABOLIC PANEL
BUN / CREAT RATIO: 16 (ref 12–28)
BUN: 23 mg/dL (ref 8–27)
CO2: 21 mmol/L (ref 20–29)
Calcium: 9.8 mg/dL (ref 8.7–10.3)
Chloride: 102 mmol/L (ref 96–106)
Creatinine, Ser: 1.48 mg/dL — ABNORMAL HIGH (ref 0.57–1.00)
GFR, EST AFRICAN AMERICAN: 41 mL/min/{1.73_m2} — AB (ref >59)
GFR, EST NON AFRICAN AMERICAN: 36 mL/min/{1.73_m2} — AB (ref >59)
Glucose: 88 mg/dL (ref 65–99)
POTASSIUM: 3.9 mmol/L (ref 3.5–5.2)
SODIUM: 144 mmol/L (ref 134–144)

## 2017-09-23 NOTE — Progress Notes (Signed)
Cholesterol significantly improved, total cholesterol and bad cholesterol (LDL) level now in normal range. Triglyceride still very high, refer to lipid clinic. Normal liver enzyme AST and ALT but alkaline phosphatase, recommend continue Crestor prior to lipid clinic

## 2017-09-23 NOTE — Telephone Encounter (Signed)
Although potassium level improved, but she is showing evidence of dehydration as kidney function slightly down. I think the chlorthalidone may be too strong for her, recommend hold chlorthalidone and KCl and transition to amlodipine 5 mg  daily.

## 2017-09-24 ENCOUNTER — Other Ambulatory Visit: Payer: Self-pay

## 2017-09-24 MED ORDER — AMLODIPINE BESYLATE 5 MG PO TABS: 5.0000 mg | ORAL_TABLET | Freq: Every day | ORAL | 3 refills | Status: DC

## 2017-09-26 DIAGNOSIS — E876 Hypokalemia: Secondary | ICD-10-CM | POA: Diagnosis not present

## 2017-09-26 DIAGNOSIS — I10 Essential (primary) hypertension: Secondary | ICD-10-CM | POA: Diagnosis not present

## 2017-09-26 DIAGNOSIS — N399 Disorder of urinary system, unspecified: Secondary | ICD-10-CM | POA: Diagnosis not present

## 2017-09-26 DIAGNOSIS — E039 Hypothyroidism, unspecified: Secondary | ICD-10-CM | POA: Diagnosis not present

## 2017-09-29 NOTE — Progress Notes (Deleted)
Office Visit Note  Patient: Claudia Cantrell             Date of Birth: October 14, 1946           MRN: [id number]             PCP: Patient, [name] Referring: Wallene Huh, MD Visit Date: 10/10/2017 Occupation: @GUAROCC @    Subjective:  No chief complaint on file.   History of Present Illness: Claudia Cantrell is a 71 y.o. female ***   Activities of Daily Living:  Patient reports morning stiffness for *** {minute/hour:19697}.   Patient {ACTIONS;DENIES/REPORTS:21021675::"Denies"} nocturnal pain.  Difficulty dressing/grooming: {ACTIONS;DENIES/REPORTS:21021675::"Denies"} Difficulty climbing stairs: {ACTIONS;DENIES/REPORTS:21021675::"Denies"} Difficulty getting out of chair: {ACTIONS;DENIES/REPORTS:21021675::"Denies"} Difficulty using hands for taps, buttons, cutlery, and/or writing: {ACTIONS;DENIES/REPORTS:21021675::"Denies"}   No Rheumatology ROS completed.   PMFS History:  Patient Active Problem List   Diagnosis Date Noted  . 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 Vitamin D deficiency 03/26/2017  . Medication monitoring encounter 03/26/2017  . Gout 10/08/2016  . PAF (paroxysmal atrial fibrillation) ([place])   . Hypertensive heart disease   . Hyperlipidemia   . CAD (coronary artery disease)   . Nonischemic cardiomyopathy ([place])   . Chest pain with moderate risk for cardiac etiology 08/02/2016  . Atrial fibrillation ([place]) 07/29/2016  . Closed right ankle fracture 04/24/2015  . Ankle fracture, bimalleolar, closed 04/24/2015  . 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 CAD in native artery 10/09/2013  . OSA on CPAP 10/09/2013  . Morbid (severe) obesity due to excess calories ([place]) 05/27/2013  . Essential hypertension, benign 05/27/2013  . Other and unspecified hyperlipidemia 05/27/2013    Past Medical History:  Diagnosis Date  . Arthritis    rheumatoid  . CAD (coronary artery disease)    a. 1992 s/p MI and PTCA of unknown  vessel;  b. 09/2010 Cath: LM nl, LAD 20p, LCX 45m, RCA 80m, RPL 20.  . Cancer ([place])   . Family history of anesthesia complication    daughter has difficulty waking   . GERD (gastroesophageal reflux disease)    occ  . Gout 10/08/2016  . Hyperlipidemia   . Hypertensive heart disease   . Nonischemic cardiomyopathy ([place])    a. 07/2016 Echo: EF 40-45%, mild LVH, inferior akinesis, moderately dilated left atrium, trivial AI and MR.  Marland Kitchen PAF (paroxysmal atrial fibrillation) ([place])    a. 07/2016 Admitted w/ AF RVR-->CHA2DS2VASc = 5-->Eliquis;  b. 07/2016 successful TEE/DCCV.  Marland Kitchen Sleep apnea    a. Using CPAP.    Family History  Problem Relation Age of Onset  . Heart disease Mother   . Sudden death Mother   . Diabetes Father   . Stroke Father   . Bone cancer Sister   . Sudden death Brother   . Melanoma Brother   . Heart disease Brother    Past Surgical History:  Procedure Laterality Date  . ABDOMINAL HYSTERECTOMY  1988  . BACK SURGERY  1994  . CARDIAC CATHETERIZATION  1991 AND 1992   PTCA BY DR Lamount Cohen  . CARDIOVERSION N/A 08/01/2016   Procedure: CARDIOVERSION;  Surgeon: Lelon Perla, MD;  Location: [facility] ENDOSCOPY;  Service: Cardiovascular;  Laterality: N/A;  . ORIF ANKLE FRACTURE Right 04/27/2015   Procedure: OPEN REDUCTION INTERNAL FIXATION (ORIF) RIGHT BIMALLEOLAR ANKLE FRACTURE WITH SYNDESMOSIS FIXATION;  Surgeon: Leandrew Koyanagi, MD;  Location: [place];  Service: Orthopedics;  Laterality: Right;  . TEE WITHOUT CARDIOVERSION N/A 08/01/2016   Procedure:  TRANSESOPHAGEAL ECHOCARDIOGRAM (TEE);  Surgeon: Lelon Perla, MD;  Location: [facility] ENDOSCOPY;  Service: Cardiovascular;  Laterality: N/A;  . THYROIDECTOMY  11/24/2013   DR Dalbert Batman  . THYROIDECTOMY N/A 11/24/2013   Procedure: TOTAL THYROIDECTOMY;  Surgeon: Adin Hector, MD;  Location: [place];  Service: General;  Laterality: N/A;  . TRANSTHORACIC ECHOCARDIOGRAM  01/15/2013   EF 55% TO 65%. PROBABLE MILD HYPOKINESIS OF THE INFERIOR  MYOCARDIUM. GRADE 1 DIASTOLIC DYSFUNCTION. TRIAL AR.LA IS MILDLY DILATED.   Social History   Social History Narrative  . No narrative on file     Objective: Vital Signs: There were no vitals taken for this visit.   Physical Exam   Musculoskeletal Exam: ***  CDAI Exam: No CDAI exam completed.    Investigation: No additional findings.Uric acid: 08/27/17 10.4 CBC Latest Ref Rng & Units 08/27/2017 04/09/2017 10/09/2016  WBC 3.4 - 10.8 x10E3/uL 3.1(L) 4.5 3.5(L)  Hemoglobin 11.1 - 15.9 g/dL 15.6 14.7 15.0  Hematocrit 34.0 - 46.6 % 46.3 45.5(H) 45.8(H)  Platelets 150 - 379 x10E3/uL 268 290 248   CMP Latest Ref Rng & Units 09/20/2017 09/11/2017 08/27/2017  Glucose 65 - 99 mg/dL 88 127(H) 93  BUN 8 - 27 mg/dL 23 15 16   Creatinine 0.57 - 1.00 mg/dL 1.48(H) 0.75 1.15(H)  Sodium 134 - 144 mmol/L 144 140 140  Potassium 3.5 - 5.2 mmol/L 3.9 3.2(L) 3.4(L)  Chloride 96 - 106 mmol/L 102 98 99  CO2 20 - 29 mmol/L 21 25 26   Calcium 8.7 - 10.3 mg/dL 9.8 9.6 9.5  Total Protein 6.0 - 8.5 g/dL 8.1 - 7.6  Total Bilirubin 0.0 - 1.2 mg/dL 0.4 - 0.5  Alkaline Phos 39 - 117 IU/L 132(H) - 128(H)  AST 0 - 40 IU/L 19 - 19  ALT 0 - 32 IU/L 14 - 13    Imaging: No results found.  Speciality Comments: No specialty comments available.    Procedures:  No procedures performed Allergies: Codeine   Assessment / Plan:     Visit Diagnoses: No diagnosis found.    Orders: No orders of the defined types were placed in this encounter.  No orders of the defined types were placed in this encounter.   Face-to-face time spent with patient was *** minutes. 50% of time was spent in counseling and coordination of care.  Follow-Up Instructions: No Follow-up on file.   Earnestine Mealing, NT  Note - This record has been created using Editor, commissioning.  Chart creation errors have been sought, but may not always  have been located. Such creation errors do not reflect on  the standard of medical care.

## 2017-10-06 DIAGNOSIS — G4733 Obstructive sleep apnea (adult) (pediatric): Secondary | ICD-10-CM | POA: Diagnosis not present

## 2017-10-07 ENCOUNTER — Encounter: Payer: Self-pay | Admitting: Rheumatology

## 2017-10-07 ENCOUNTER — Ambulatory Visit (INDEPENDENT_AMBULATORY_CARE_PROVIDER_SITE_OTHER): Payer: PPO | Admitting: Rheumatology

## 2017-10-07 VITALS — BP 165/80 | HR 55 | Resp 18 | Ht 65.0 in | Wt 230.0 lb

## 2017-10-07 DIAGNOSIS — Z5181 Encounter for therapeutic drug level monitoring: Secondary | ICD-10-CM | POA: Diagnosis not present

## 2017-10-07 DIAGNOSIS — E790 Hyperuricemia without signs of inflammatory arthritis and tophaceous disease: Secondary | ICD-10-CM | POA: Diagnosis not present

## 2017-10-07 DIAGNOSIS — M1A09X0 Idiopathic chronic gout, multiple sites, without tophus (tophi): Secondary | ICD-10-CM | POA: Diagnosis not present

## 2017-10-07 LAB — URIC ACID: Uric Acid, Serum: 2.2 mg/dL — ABNORMAL LOW (ref 2.5–7.0)

## 2017-10-07 MED ORDER — COLCHICINE 0.6 MG PO TABS
ORAL_TABLET | ORAL | 2 refills | Status: DC
Start: 2017-10-07 — End: 2017-12-16

## 2017-10-07 MED ORDER — FEBUXOSTAT 80 MG PO TABS: 80.0000 mg | ORAL_TABLET | Freq: Every day | ORAL | 5 refills | Status: DC

## 2017-10-07 NOTE — Patient Instructions (Addendum)
   Patient needs an M.D. List  Patient states that she will need a new endocrinologist her her current one is retiring.

## 2017-10-07 NOTE — Progress Notes (Signed)
Office Visit Note  Patient: Claudia Cantrell             Date of Birth: [birth year]           MRN: [id number]             PCP: Patient, [name] Referring: Wallene Huh, MD Visit Date: 10/07/2017 Occupation: _0 @    Subjective:  Arthritis (Needs labs, doing good)   History of Present Illness: Claudia Cantrell is a 71 y.o. female   Having generalized aches and pains  Stopped taking uloric since "i was doing so well.". Pharmacist reminding pt the importance of taking uloric until stopped by the doctor. Was off of uloric for 2 months.   Both big toes with slight soreness 2 weeks ago. Restarted uloric 3 weeks ago.   Activities of Daily Living:  Patient reports morning stiffness for 15 minutes.   Patient Reports nocturnal pain.  Difficulty dressing/grooming: Denies Difficulty climbing stairs: Reports Difficulty getting out of chair: Reports Difficulty using hands for taps, buttons, cutlery, and/or writing: Reports   Review of Systems  Constitutional: Negative for fatigue.  HENT: Negative for mouth sores and mouth dryness.   Eyes: Negative for dryness.  Respiratory: Negative for shortness of breath.   Gastrointestinal: Negative for constipation and diarrhea.  Musculoskeletal: Negative for myalgias and myalgias.  Skin: Negative for sensitivity to sunlight.  Psychiatric/Behavioral: Negative for decreased concentration and sleep disturbance.    PMFS History:  Patient Active Problem List   Diagnosis Date Noted  . 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 Vitamin D deficiency 03/26/2017  . Medication monitoring encounter 03/26/2017  . Gout 10/08/2016  . PAF (paroxysmal atrial fibrillation) ([place])   . Hypertensive heart disease   . Hyperlipidemia   . CAD (coronary artery disease)   . Nonischemic cardiomyopathy ([place])   . Chest pain with moderate risk for cardiac etiology 08/02/2016  . Atrial fibrillation ([place]) 07/29/2016    . Closed right ankle fracture 04/24/2015  . Ankle fracture, bimalleolar, closed 04/24/2015  . 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 CAD in native artery 10/09/2013  . OSA on CPAP 10/09/2013  . Morbid (severe) obesity due to excess calories ([place]) 05/27/2013  . Essential hypertension, benign 05/27/2013  . Other and unspecified hyperlipidemia 05/27/2013    Past Medical History:  Diagnosis Date  . Arthritis    rheumatoid  . CAD (coronary artery disease)    a. 1992 s/p MI and PTCA of unknown vessel;  b. 09/2010 Cath: LM nl, LAD 20p, LCX 57m RCA 186mRPL 20.  . [place]  . Family history of anesthesia complication    daughter has difficulty waking   . GERD (gastroesophageal reflux disease)    occ  . Gout 10/08/2016  . Hyperlipidemia   . Hypertensive heart disease   . Nonischemic cardiomyopathy ([place]   a. 07/2016 Echo: EF 40-45%, mild LVH, inferior akinesis, moderately dilated left atrium, trivial AI and MR.  . Marland KitchenAF (paroxysmal atrial fibrillation) ([place]   a. 07/2016 Admitted w/ AF RVR-->CHA2DS2VASc = 5-->Eliquis;  b. 07/2016 successful TEE/DCCV.  . Marland Kitchenleep apnea    a. Using CPAP.    Family History  Problem Relation Age of Onset  . Heart disease Mother   . Sudden death Mother   . Diabetes Father   . Stroke Father   . Bone cancer Sister   . Sudden death Brother   . Melanoma Brother   . Heart disease  Brother    Past Surgical History:  Procedure Laterality Date  . ABDOMINAL HYSTERECTOMY  1988  . BACK SURGERY  1994  . CARDIAC CATHETERIZATION  1991 AND 1992   PTCA BY DR Lamount Cohen  . CARDIOVERSION N/A 08/01/2016   Procedure: CARDIOVERSION;  Surgeon: Lelon Perla, MD;  Location: [facility] ENDOSCOPY;  Service: Cardiovascular;  Laterality: N/A;  . ORIF ANKLE FRACTURE Right 04/27/2015   Procedure: OPEN REDUCTION INTERNAL FIXATION (ORIF) RIGHT BIMALLEOLAR ANKLE FRACTURE WITH SYNDESMOSIS FIXATION;  Surgeon: Leandrew Koyanagi, MD;  Location: [place];  Service: Orthopedics;   Laterality: Right;  . TEE WITHOUT CARDIOVERSION N/A 08/01/2016   Procedure: TRANSESOPHAGEAL ECHOCARDIOGRAM (TEE);  Surgeon: Lelon Perla, MD;  Location: [facility] ENDOSCOPY;  Service: Cardiovascular;  Laterality: N/A;  . THYROIDECTOMY  11/24/2013   DR Dalbert Batman  . THYROIDECTOMY N/A 11/24/2013   Procedure: TOTAL THYROIDECTOMY;  Surgeon: Adin Hector, MD;  Location: [place];  Service: General;  Laterality: N/A;  . TRANSTHORACIC ECHOCARDIOGRAM  01/15/2013   EF 55% TO 65%. PROBABLE MILD HYPOKINESIS OF THE INFERIOR MYOCARDIUM. GRADE 1 DIASTOLIC DYSFUNCTION. TRIAL AR.LA IS MILDLY DILATED.   Social History   Social History Narrative  . No narrative on file     Objective: Vital Signs: BP (!) 165/80 (BP Location: Left Arm, Patient Position: Sitting, Cuff Size: Normal)   Pulse (!) 55   Resp 18   Ht 5' 5" (1.651 m)   Wt 230 lb (104.3 kg)   BMI 38.27 kg/m    Physical Exam   Musculoskeletal Exam:  Full range of motion of all joints Grip strength is equal and strong bilaterally Fibromyalgia tender points are all absent  CDAI Exam: CDAI Homunculus Exam:   Tenderness:  Right hand: 2nd MCP Left hand: 2nd MCP RLE: tibiotalar  Joint Counts:  CDAI Tender Joint count: 2 CDAI Swollen Joint count: 0     Investigation: No additional findings. Telephone on 09/12/2017  Component Date Value Ref Range Status  . Glucose 09/20/2017 88  65 - 99 mg/dL Final  . BUN 09/20/2017 23  8 - 27 mg/dL Final  . Creatinine, Ser 09/20/2017 1.48* 0.57 - 1.00 mg/dL Final  . GFR calc non Af Amer 09/20/2017 36* >59 mL/min/1.73 Final  . GFR calc Af Amer 09/20/2017 41* >59 mL/min/1.73 Final  . BUN/Creatinine Ratio 09/20/2017 16  12 - 28 Final  . Sodium 09/20/2017 144  134 - 144 mmol/L Final  . Potassium 09/20/2017 3.9  3.5 - 5.2 mmol/L Final  . Chloride 09/20/2017 102  96 - 106 mmol/L Final  . CO2 09/20/2017 21  20 - 29 mmol/L Final  . Calcium 09/20/2017 9.8  8.7 - 10.3 mg/dL Final  Office Visit on 09/11/2017   Component Date Value Ref Range Status  . Glucose 09/11/2017 127* 65 - 99 mg/dL Final  . BUN 09/11/2017 15  8 - 27 mg/dL Final  . Creatinine, Ser 09/11/2017 0.75  0.57 - 1.00 mg/dL Final  . GFR calc non Af Amer 09/11/2017 82  >59 mL/min/1.73 Final  . GFR calc Af Amer 09/11/2017 94  >59 mL/min/1.73 Final  . BUN/Creatinine Ratio 09/11/2017 20  12 - 28 Final  . Sodium 09/11/2017 140  134 - 144 mmol/L Final  . Potassium 09/11/2017 3.2* 3.5 - 5.2 mmol/L Final  . Chloride 09/11/2017 98  96 - 106 mmol/L Final  . CO2 09/11/2017 25  20 - 29 mmol/L Final  . Calcium 09/11/2017 9.6  8.7 - 10.3 mg/dL Final  . Total  Protein 09/20/2017 8.1  6.0 - 8.5 g/dL Final  . Albumin 09/20/2017 4.5  3.6 - 4.8 g/dL Final  . Bilirubin Total 09/20/2017 0.4  0.0 - 1.2 mg/dL Final  . Bilirubin, Direct 09/20/2017 0.12  0.00 - 0.40 mg/dL Final  . Alkaline Phosphatase 09/20/2017 132* 39 - 117 IU/L Final  . AST 09/20/2017 19  0 - 40 IU/L Final  . ALT 09/20/2017 14  0 - 32 IU/L Final  . Cholesterol, Total 09/20/2017 177  100 - 199 mg/dL Final  . Triglycerides 09/20/2017 269* 0 - 149 mg/dL Final  . HDL 09/20/2017 34* >39 mg/dL Final   Comment: **Effective September 30, 2017, HDL Cholesterol**   reference interval will be changing to:                                   Female        Female                               20 - [phone]   23 - [phone]   . VLDL Cholesterol Cal 09/20/2017 54* 5 - 40 mg/dL Final  . LDL Calculated 09/20/2017 89  0 - 99 mg/dL Final  . Chol/HDL Ratio 09/20/2017 5.2* 0.0 - 4.4 ratio Final   Comment:                                   T. Chol/HDL Ratio                                             Men  Women                               1/2 Avg.Risk  3.4    3.3                                   Avg.Risk  5.0    4.4                                2X Avg.Risk  9.6    7.1                                3X Avg.Risk 23.4   11.0   Orders Only on 08/22/2017  Component Date Value Ref Range Status  . TSH  08/27/2017 0.457  0.450 - 4.500 uIU/mL Final  Office Visit on 08/21/2017  Component Date Value Ref Range Status  . Glucose 08/27/2017 93  65 - 99 mg/dL Final  . BUN 08/27/2017 16  8 - 27 mg/dL Final  . Creatinine, Ser 08/27/2017 1.15* 0.57 - 1.00 mg/dL Final  . GFR calc non Af Amer 08/27/2017 49* >59 mL/min/1.73 Final  . GFR calc Af Amer 08/27/2017 56* >59 mL/min/1.73 Final  . BUN/Creatinine Ratio 08/27/2017 14  12 - 28 Final  . Sodium 08/27/2017 140  134 - 144 mmol/L Final  .  Potassium 08/27/2017 3.4* 3.5 - 5.2 mmol/L Final  . Chloride 08/27/2017 99  96 - 106 mmol/L Final  . CO2 08/27/2017 26  20 - 29 mmol/L Final  . Calcium 08/27/2017 9.5  8.7 - 10.3 mg/dL Final  . Total Protein 08/27/2017 7.6  6.0 - 8.5 g/dL Final  . Albumin 08/27/2017 4.2  3.6 - 4.8 g/dL Final  . Globulin, Total 08/27/2017 3.4  1.5 - 4.5 g/dL Final  . Albumin/Globulin Ratio 08/27/2017 1.2  1.2 - 2.2 Final  . Bilirubin Total 08/27/2017 0.5  0.0 - 1.2 mg/dL Final  . Alkaline Phosphatase 08/27/2017 128* 39 - 117 IU/L Final  . AST 08/27/2017 19  0 - 40 IU/L Final  . ALT 08/27/2017 13  0 - 32 IU/L Final  . WBC 08/27/2017 3.1* 3.4 - 10.8 x10E3/uL Final  . RBC 08/27/2017 5.33* 3.77 - 5.28 x10E6/uL Final  . Hemoglobin 08/27/2017 15.6  11.1 - 15.9 g/dL Final  . Hematocrit 08/27/2017 46.3  34.0 - 46.6 % Final  . MCV 08/27/2017 87  79 - 97 fL Final  . MCH 08/27/2017 29.3  26.6 - 33.0 pg Final  . MCHC 08/27/2017 33.7  31.5 - 35.7 g/dL Final  . RDW 08/27/2017 14.7  12.3 - 15.4 % Final  . Platelets 08/27/2017 268  150 - 379 x10E3/uL Final  . Cholesterol, Total 08/27/2017 253* 100 - 199 mg/dL Final  . Triglycerides 08/27/2017 373* 0 - 149 mg/dL Final  . HDL 08/27/2017 32* >39 mg/dL Final  . VLDL Cholesterol Cal 08/27/2017 75* 5 - 40 mg/dL Final  . LDL Calculated 08/27/2017 146* 0 - 99 mg/dL Final  . Chol/HDL Ratio 08/27/2017 7.9* 0.0 - 4.4 ratio Final   Comment:                                   T. Chol/HDL Ratio                                              Men  Women                               1/2 Avg.Risk  3.4    3.3                                   Avg.Risk  5.0    4.4                                2X Avg.Risk  9.6    7.1                                3X Avg.Risk 23.4   11.0   . Uric Acid 08/27/2017 10.4* 2.5 - 7.1 mg/dL Final              Therapeutic target for gout patients: <6.0  Office Visit on 04/09/2017  Component Date Value Ref Range Status  . WBC 04/09/2017 4.5  3.8 - 10.8 K/uL Final  . RBC 04/09/2017 5.12* 3.80 - 5.10 MIL/uL Final  .  Hemoglobin 04/09/2017 14.7  11.7 - 15.5 g/dL Final  . HCT 04/09/2017 45.5* 35.0 - 45.0 % Final  . MCV 04/09/2017 88.9  80.0 - 100.0 fL Final  . MCH 04/09/2017 28.7  27.0 - 33.0 pg Final  . MCHC 04/09/2017 32.3  32.0 - 36.0 g/dL Final  . RDW 04/09/2017 15.3* 11.0 - 15.0 % Final  . Platelets 04/09/2017 290  140 - 400 K/uL Final  . MPV 04/09/2017 9.2  7.5 - 12.5 fL Final  . Neutro Abs 04/09/2017 2385  1,500 - 7,800 cells/uL Final  . Lymphs Abs 04/09/2017 1350  850 - 3,900 cells/uL Final  . Monocytes Absolute 04/09/2017 585  200 - 950 cells/uL Final  . Eosinophils Absolute 04/09/2017 180  15 - 500 cells/uL Final  . Basophils Absolute 04/09/2017 0  0 - 200 cells/uL Final  . Neutrophils Relative % 04/09/2017 53  % Final  . Lymphocytes Relative 04/09/2017 30  % Final  . Monocytes Relative 04/09/2017 13  % Final  . Eosinophils Relative 04/09/2017 4  % Final  . Basophils Relative 04/09/2017 0  % Final  . Smear Review 04/09/2017 Criteria for review not met   Final  . Sodium 04/09/2017 143  135 - 146 mmol/L Final  . Potassium 04/09/2017 4.2  3.5 - 5.3 mmol/L Final  . Chloride 04/09/2017 108  98 - 110 mmol/L Final  . CO2 04/09/2017 25  20 - 31 mmol/L Final  . Glucose, Bld 04/09/2017 80  65 - 99 mg/dL Final  . BUN 04/09/2017 18  7 - 25 mg/dL Final  . Creat 04/09/2017 1.06* 0.50 - 0.99 mg/dL Final   Comment:   For patients > or = 69 years of age: The upper  reference limit for Creatinine is approximately 13% higher for people identified as African-American.     . Total Bilirubin 04/09/2017 0.4  0.2 - 1.2 mg/dL Final  . Alkaline Phosphatase 04/09/2017 93  33 - 130 U/L Final  . AST 04/09/2017 12  10 - 35 U/L Final  . ALT 04/09/2017 9  6 - 29 U/L Final  . Total Protein 04/09/2017 7.4  6.1 - 8.1 g/dL Final  . Albumin 04/09/2017 4.1  3.6 - 5.1 g/dL Final  . Calcium 04/09/2017 9.3  8.6 - 10.4 mg/dL Final  . GFR, Est African American 04/09/2017 62  >=60 mL/min Final  . GFR, Est Non African American 04/09/2017 54* >=60 mL/min Final  . Uric Acid, Serum 04/09/2017 2.0* 2.5 - 7.0 mg/dL Final  . Vit D, 25-Hydroxy 04/09/2017 34  30 - 100 ng/mL Final   Comment: Vitamin D Status           25-OH Vitamin D        Deficiency                <20 ng/mL        Insufficiency         20 - 29 ng/mL        Optimal             > or = 30 ng/mL   For 25-OH Vitamin D testing on patients on D2-supplementation and patients for whom quantitation of D2 and D3 fractions is required, the QuestAssureD 25-OH VIT D, (D2,D3), LC/MS/MS is recommended: order code [phone] (patients > 2 yrs).      Imaging: No results found.  Speciality Comments: No specialty comments available.    Procedures:  No procedures performed Allergies: Codeine   Assessment /  Plan:     Visit Diagnoses: Idiopathic chronic gout of multiple sites without tophus - Plan: Uric acid, Urinalysis, Routine w reflex microscopic  Hyperuricemia - Plan: Uric acid, Urinalysis, Routine w reflex microscopic  Medication monitoring encounter - Plan: Urinalysis, Routine w reflex microscopic  Morbid (severe) obesity due to excess calories ([place])   Plan  #1:hyperuricemia/gout. No tophi. #1: Patient stopped using her Uloric since she was doing well. She was off of it for about 2 months. When she went to se her pharmacy he noted she was off of the medication and advised her to get back on it. After he should go  back on it, she started having joint pain. Consistent with gout flare  #2: Hyperuricemia on uric acid done in September 2018. Greater than 10. #3: Medication monitoring Patient had CBC with differential and CMP with GFR donein October 2018 She will need a repeat uric acid since she was off of her Uloric for couple of months. We will also repeat a urinalysis. Note that patient hasdecreased GFR and slight increase in creatinine on previous labs. #4: Return to clinic in 5 months  #5: Patient was  Educated on gout and proper use of her medications. #6: Patient states that she does not have a PCP and wants to get one. She also states that her endocrinologist will be retiring soonand she needs to get a new endocrinologist.  #7: Refill Uloric 80 mg daily; 30 day supply with 5 refills Refill Colcrys 0.6 mg; 1 by mouth daily when necessary flare until flare is resolved.; 30 pills with 2 refills  Orders: Orders Placed This Encounter  Procedures  . Uric acid  . Urinalysis, Routine w reflex microscopic   No orders of the defined types were placed in this encounter.   Face-to-face time spent with patient was 30 minutes. 50% of time was spent in counseling and coordination of care.  Follow-Up Instructions: Return in about 5 months (around 03/07/2018) for gout,rt ankle pain, hand pain,.   Eliezer Lofts, PA-C  Note - This record has been created using [employer].  Chart creation errors have been sought, but may not always  have been located. Such creation errors do not reflect on  the standard of medical care.

## 2017-10-08 LAB — URINALYSIS, ROUTINE W REFLEX MICROSCOPIC
BACTERIA UA: NONE SEEN /HPF
Bilirubin Urine: NEGATIVE
Glucose, UA: NEGATIVE
HGB URINE DIPSTICK: NEGATIVE
Hyaline Cast: NONE SEEN /LPF
KETONES UR: NEGATIVE
Leukocytes, UA: NEGATIVE
Nitrite: NEGATIVE
RBC / HPF: NONE SEEN /HPF (ref 0–2)
SPECIFIC GRAVITY, URINE: 1.023 (ref 1.001–1.03)
pH: <=5 (ref 5.0–8.0)

## 2017-10-10 ENCOUNTER — Ambulatory Visit: Payer: PPO | Admitting: Rheumatology

## 2017-10-10 ENCOUNTER — Ambulatory Visit (INDEPENDENT_AMBULATORY_CARE_PROVIDER_SITE_OTHER): Payer: PPO | Admitting: Pharmacist Clinician (PhC)/ Clinical Pharmacy Specialist

## 2017-10-10 DIAGNOSIS — E7800 Pure hypercholesterolemia, unspecified: Secondary | ICD-10-CM | POA: Diagnosis not present

## 2017-10-10 NOTE — Patient Instructions (Signed)
Mediterranean Diet A Mediterranean diet refers to food and lifestyle choices that are based on the traditions of countries located on the [place]. This way of eating has been shown to help prevent certain conditions and improve outcomes for people who have chronic diseases, like kidney disease and heart disease. What are tips for following this plan? Lifestyle  Cook and eat meals together with your family, when possible.  Drink enough fluid to keep your urine clear or pale yellow.  Be physically active every day. This includes: ? Aerobic exercise like running or swimming. ? Leisure activities like gardening, walking, or housework.  Get 7-8 hours of sleep each night.  If recommended by your health care provider, drink red wine in moderation. This means 1 glass a day for nonpregnant women and 2 glasses a day for men. A glass of wine equals 5 oz (150 mL). Reading food labels  Check the serving size of packaged foods. For foods such as rice and pasta, the serving size refers to the amount of cooked product, not dry.  Check the total fat in packaged foods. Avoid foods that have saturated fat or trans fats.  Check the ingredients list for added sugars, such as corn syrup. Shopping  At the grocery store, buy most of your food from the areas near the walls of the store. This includes: ? Fresh fruits and vegetables (produce). ? Grains, beans, nuts, and seeds. Some of these may be available in unpackaged forms or large amounts (in bulk). ? Fresh seafood. ? Poultry and eggs. ? Low-fat dairy products.  Buy whole ingredients instead of prepackaged foods.  Buy fresh fruits and vegetables in-season from local farmers markets.  Buy frozen fruits and vegetables in resealable bags.  If you do not have access to quality fresh seafood, buy precooked frozen shrimp or canned fish, such as tuna, salmon, or sardines.  Buy small amounts of raw or cooked vegetables, salads, or olives from the  deli or salad bar at your store.  Stock your pantry so you always have certain foods on hand, such as olive oil, canned tuna, canned tomatoes, rice, pasta, and beans. Cooking  Cook foods with extra-virgin olive oil instead of using butter or other vegetable oils.  Have meat as a side dish, and have vegetables or grains as your main dish. This means having meat in small portions or adding small amounts of meat to foods like pasta or stew.  Use beans or vegetables instead of meat in common dishes like chili or lasagna.  Experiment with different cooking methods. Try roasting or broiling vegetables instead of steaming or sauteing them.  Add frozen vegetables to soups, stews, pasta, or rice.  Add nuts or seeds for added healthy fat at each meal. You can add these to yogurt, salads, or vegetable dishes.  Marinate fish or vegetables using olive oil, lemon juice, garlic, and fresh herbs. Meal planning  Plan to eat 1 vegetarian meal one day each week. Try to work up to 2 vegetarian meals, if possible.  Eat seafood 2 or more times a week.  Have healthy snacks readily available, such as: ? Vegetable sticks with hummus. ? Greek yogurt. ? Fruit and nut trail mix.  Eat balanced meals throughout the week. This includes: ? Fruit: 2-3 servings a day ? Vegetables: 4-5 servings a day ? Low-fat dairy: 2 servings a day ? Fish, poultry, or lean meat: 1 serving a day ? Beans and legumes: 2 or more servings a week ? Nuts   and seeds: 1-2 servings a day ? Whole grains: 6-8 servings a day ? Extra-virgin olive oil: 3-4 servings a day  Limit red meat and sweets to only a few servings a month What are my food choices?  Mediterranean diet ? Recommended ? Grains: Whole-grain pasta. Brown rice. Bulgar wheat. Polenta. Couscous. Whole-wheat bread. Oatmeal. Quinoa. ? Vegetables: Artichokes. Beets. Broccoli. Cabbage. Carrots. Eggplant. Green beans. Chard. Kale. Spinach. Onions. Leeks. Peas. Squash.  Tomatoes. Peppers. Radishes. ? Fruits: Apples. Apricots. Avocado. Berries. Bananas. Cherries. Dates. Figs. Grapes. Lemons. Melon. Oranges. Peaches. Plums. Pomegranate. ? Meats and other protein foods: Beans. Almonds. Sunflower seeds. Pine nuts. Peanuts. Cod. Salmon. Scallops. Shrimp. Tuna. Tilapia. Clams. Oysters. Eggs. ? Dairy: Low-fat milk. Cheese. Greek yogurt. ? Beverages: Water. Red wine. Herbal tea. ? Fats and oils: Extra virgin olive oil. Avocado oil. Grape seed oil. ? Sweets and desserts: Greek yogurt with honey. Baked apples. Poached pears. Trail mix. ? Seasoning and other foods: Basil. Cilantro. Coriander. Cumin. Mint. Parsley. Sage. Rosemary. Tarragon. Garlic. Oregano. Thyme. Pepper. Balsalmic vinegar. Tahini. Hummus. Tomato sauce. Olives. Mushrooms. ? Limit these ? Grains: Prepackaged pasta or rice dishes. Prepackaged cereal with added sugar. ? Vegetables: Deep fried potatoes (french fries). ? Fruits: Fruit canned in syrup. ? Meats and other protein foods: Beef. Pork. Lamb. Poultry with skin. Hot dogs. Bacon. ? Dairy: Ice cream. Sour cream. Whole milk. ? Beverages: Juice. Sugar-sweetened soft drinks. Beer. Liquor and spirits. ? Fats and oils: Butter. Canola oil. Vegetable oil. Beef fat (tallow). Lard. ? Sweets and desserts: Cookies. Cakes. Pies. Candy. ? Seasoning and other foods: Mayonnaise. Premade sauces and marinades. ? The items listed may not be a complete list. Talk with your dietitian about what dietary choices are right for you. Summary  The Mediterranean diet includes both food and lifestyle choices.  Eat a variety of fresh fruits and vegetables, beans, nuts, seeds, and whole grains.  Limit the amount of red meat and sweets that you eat.  Talk with your health care provider about whether it is safe for you to drink red wine in moderation. This means 1 glass a day for nonpregnant women and 2 glasses a day for men. A glass of wine equals 5 oz (150 mL). This information  is not intended to replace advice given to you by your health care provider. Make sure you discuss any questions you have with your health care provider. Document Released: 07/19/2016 Document Revised: 08/21/2016 Document Reviewed: 07/19/2016 Elsevier Interactive Patient Education  2018 [employer]  

## 2017-10-10 NOTE — Progress Notes (Signed)
10/10/2017 Claudia Cantrell [birth year] [id number]   HPI:  Claudia Cantrell is a 71 y.o. female patient of Dr Claiborne Billings, who presents today for a lipid clinic evaluation.  In addition to mixed hyperlipidemia, patient has a medical history significant for CAD, with MI in 1991, hypertension, obstructive sleep apnea, atrial fibrillation and nonischemic cardiomyopathy (most recent EF 40-45%).  She also suffers from hypothyroidism, with thyroidectomy in 2014 due to a multinodular goiter.    She has been non-compliant with her rosuvastatin in the past, and when she saw Isaac Laud last month she was encouraged to take her medication daily.  About 2 weeks after re-starting the medication she had labs drawn her LDL had dropped from 146 to 89.    Today she has a lot of questions and admits to having poor eating and exercise habits. She admits that living alone she doesn't think about cooking, but will just grab something easy.  She knows she should exercise, but cannot find the motivation to do so.    Current Medications:  Rosuvastatin 40 mg (re-started 1 month ago)  Cholesterol Goals:   LDL < 70  Diet:  Patient eating mostly quick, prepared type foods that are high in sodium.   Likes potatoes and breads, fast foods and sweets.  Does not eat beef and likes fish, although has to avoid due to recurrent gout.  Admits to poor hydration, as she forgets to drink water regularly.  Exercise:    Only walks when visiting her daughter in [place] (who makes her go out and walk).  Would like to do more, but finds that she just lacks motivation.     Labs:  Results for TJUANA, VICKREY (MRN [id number]) as of 10/10/2017 16:22  Ref. Range 08/27/2017 11:30 09/20/2017 12:02  Total CHOL/HDL Ratio Latest Ref Range: 0.0 - 4.4 ratio 7.9 (H) 5.2 (H)  Cholesterol, Total Latest Ref Range: 100 - 199 mg/dL 253 (H) 177  HDL Cholesterol Latest Ref Range: >39 mg/dL 32 (L) 34 (L)  LDL (calc) Latest Ref Range: 0 - 99 mg/dL 146 (H) 89  Triglycerides  Latest Ref Range: 0 - 149 mg/dL 373 (H) 269 (H)  VLDL Cholesterol Cal Latest Ref Range: 5 - 40 mg/dL 75 (H) 54 (H)    Current Outpatient Prescriptions  Medication Sig Dispense Refill  . Acetaminophen (TYLENOL EXTRA STRENGTH PO) Take 2 tablets by mouth as needed.    Marland Kitchen amLODipine (NORVASC) 5 MG tablet Take 1 tablet (5 mg total) by mouth daily. 30 tablet 3  . apixaban (ELIQUIS) 5 MG TABS tablet Take 1 tablet (5 mg total) by mouth 2 (two) times daily. 60 tablet 0  . Biotin 10000 MCG TABS Take 1 tablet by mouth daily.    . Cholecalciferol (VITAMIN D-3 PO) Take 2,000 Units by mouth daily.     Marland Kitchen Cod Liver Oil 1000 MG CAPS Take 1 capsule by mouth daily.    . colchicine 0.6 MG tablet Use1 daily when necessary for gout flare; stop one flare is resolved and reuse when gout flare restarts 30 tablet 2  . diclofenac sodium (VOLTAREN) 1 % GEL Apply 2 g topically 4 (four) times daily. 3 Tube 0  . ELIQUIS 5 MG TABS tablet TAKE ONE TABLET BY MOUTH TWICE DAILY (Patient not taking: Reported on 10/07/2017) 60 tablet 5  . Febuxostat (ULORIC) 80 MG TABS Take 80 mg by mouth daily.    . Febuxostat 80 MG TABS Take 1 tablet (80 mg total) by mouth daily. [place]  tablet 5  . metoprolol succinate (TOPROL-XL) 50 MG 24 hr tablet Take 1 tablet (50 mg total) by mouth daily. Take with or immediately following a meal. 30 tablet 1  . potassium chloride 20 MEQ TBCR Take 20 mEq by mouth daily. (Patient not taking: Reported on 10/07/2017) 30 tablet 5  . Probiotic Product (PROBIOTIC-10) CAPS Take 1 capsule by mouth daily.    . rosuvastatin (CRESTOR) 40 MG tablet Take 1 tablet (40 mg total) by mouth daily. 90 tablet 3  . SYNTHROID 125 MCG tablet Take 1 tablet (125 mcg total) by mouth daily. Future refills must come from PCP or endocrinologist. 30 tablet 0   No current facility-administered medications for this visit.     Allergies  Allergen Reactions  . Codeine Anxiety    Past Medical History:  Diagnosis Date  . Arthritis     rheumatoid  . CAD (coronary artery disease)    a. 1992 s/p MI and PTCA of unknown vessel;  b. 09/2010 Cath: LM nl, LAD 20p, LCX 47m, RCA 2m, RPL 20.  . Cancer ([place])   . Family history of anesthesia complication    daughter has difficulty waking   . GERD (gastroesophageal reflux disease)    occ  . Gout 10/08/2016  . Hyperlipidemia   . Hypertensive heart disease   . Nonischemic cardiomyopathy ([place])    a. 07/2016 Echo: EF 40-45%, mild LVH, inferior akinesis, moderately dilated left atrium, trivial AI and MR.  Marland Kitchen PAF (paroxysmal atrial fibrillation) ([place])    a. 07/2016 Admitted w/ AF RVR-->CHA2DS2VASc = 5-->Eliquis;  b. 07/2016 successful TEE/DCCV.  Marland Kitchen Sleep apnea    a. Using CPAP.    There were no vitals taken for this visit.   Hyperlipidemia Patient with hyperlipidemia, has already shown improvement on rosuvastatin 40 mg after just 2 weeks.   Reviewed these changes to encourage her to better understand the benefit of the medication.  Had a long discussion with her (60 minutes total) about how her diet and exercise habits can influence her overall health.  Gave her instruction on how to read food labels and make better choices at the grocery store.  She was also encouraged to plan her meals 1 week at a time so that she can make those better choices when shopping.  We discussed getting out to walk, she admits that a friend has been trying to get her to mall walk.  I suggested that she take the friend up on the offer, even if it's only 1 day a week to start.  Otherwise, she can walk in her own neighborhood during the day as weather permits.   She is due to see [name] back next week, and Dr. Claiborne Billings in January.  She will repeat her lipid labs in late December so that she can review those with Dr. Claiborne Billings in January.  I invited her to make an appointment with Pharmacy should she want further dietary encouragement or help in staying on track.     Tommy Medal PharmD CPP [place] [address] [place], [place] [address]

## 2017-10-10 NOTE — Assessment & Plan Note (Signed)
Patient with hyperlipidemia, has already shown improvement on rosuvastatin 40 mg after just 2 weeks.   Reviewed these changes to encourage her to better understand the benefit of the medication.  Had a long discussion with her (60 minutes total) about how her diet and exercise habits can influence her overall health.  Gave her instruction on how to read food labels and make better choices at the grocery store.  She was also encouraged to plan her meals 1 week at a time so that she can make those better choices when shopping.  We discussed getting out to walk, she admits that a friend has been trying to get her to mall walk.  I suggested that she take the friend up on the offer, even if it's only 1 day a week to start.  Otherwise, she can walk in her own neighborhood during the day as weather permits.   She is due to see [name] back next week, and Dr. Claiborne Billings in January.  She will repeat her lipid labs in late December so that she can review those with Dr. Claiborne Billings in January.  I invited her to make an appointment with Pharmacy should she want further dietary encouragement or help in staying on track.

## 2017-10-15 ENCOUNTER — Ambulatory Visit: Payer: PPO | Admitting: Physician Assistant

## 2017-10-29 ENCOUNTER — Encounter: Payer: Self-pay | Admitting: Physician Assistant

## 2017-10-29 ENCOUNTER — Ambulatory Visit: Payer: PPO | Admitting: Physician Assistant

## 2017-10-29 VITALS — BP 130/84 | HR 60 | Ht 65.0 in | Wt 230.0 lb

## 2017-10-29 DIAGNOSIS — E785 Hyperlipidemia, unspecified: Secondary | ICD-10-CM

## 2017-10-29 DIAGNOSIS — I480 Paroxysmal atrial fibrillation: Secondary | ICD-10-CM | POA: Diagnosis not present

## 2017-10-29 DIAGNOSIS — I428 Other cardiomyopathies: Secondary | ICD-10-CM | POA: Diagnosis not present

## 2017-10-29 DIAGNOSIS — I2510 Atherosclerotic heart disease of native coronary artery without angina pectoris: Secondary | ICD-10-CM | POA: Diagnosis not present

## 2017-10-29 DIAGNOSIS — I10 Essential (primary) hypertension: Secondary | ICD-10-CM | POA: Diagnosis not present

## 2017-10-29 DIAGNOSIS — E039 Hypothyroidism, unspecified: Secondary | ICD-10-CM | POA: Diagnosis not present

## 2017-10-29 DIAGNOSIS — N179 Acute kidney failure, unspecified: Secondary | ICD-10-CM

## 2017-10-29 NOTE — Progress Notes (Signed)
Cardiology Office Note    Date:  10/31/2017   ID:  Claudia Cantrell, DOB Mar 14, 1946, MRN [id number]  PCP:  Patient, [name]  Cardiologist:  Dr. Claiborne Billings   Chief Complaint  Patient presents with  . Follow-up    seen for Dr. Claiborne Billings.     History of Present Illness:  Claudia Cantrell is a 71 y.o. female with PMH of CAD, HTN, HLD, OSA, hypothyroidism,NICM and PAF. She had remote myocardial infarction in 1991 and 1992, she underwent PTCA by Dr. Eustace Quail. In October 2011, she underwent cardiac catheterization which showed preserved LV function, very small focal area of severe hypo-contractility involving the mid inferior wall as well as posterior wall, at the time she did not have significant obstructive disease, she only had 20% narrowing at the ostium of LAD, 20% in mid left circumflex, 20% mid RCA, and 20% posterior lateral irregularities. She had a history of multinodular goiter in 2014 underwent thyroidectomy. She was admitted in August 2017 with rapid atrial fibrillation. She was started on eliquis at the time. She underwent successful TEE guided cardioversion with restoration of sinus rhythm. Echocardiogram obtained in the setting of A. fib showed EF 40-45%. She underwent Myoview in August 2017 which was low risk with normal EFand suggestive possibility of small basal and mid inferolateral defect, no ischemia.  I saw the patient in Sept for high blood pressure. I added chlorthalidone 25mg  daily, this was later switched to amlodipine due to worsening renal function. Lab work obtained on 12/27/2016 showed uncontrolled cholesterol, I also referred the patient to the lipid clinic as well. I increased her Crestor to 40 mg daily. She was seen by Tommy Medal of lipid clinic on 10/10/2017, she had a significant improvement on 40 mg daily of Crestor, it was recommended for her to repeat her lab work in late December.  Patient presents today for cardiology office visit. She has been feeling very  well. She denies any significant chest discomfort. He says she has chronic right lower extremity edema due to orthopedic surgery, however I do not see any significant lower extremity edema on physical exam. Her blood pressure is very well controlled on current medication. She did have some degree of acute kidney injury on the last lab work, this is likely related to the chlorthalidone, since then, her chlorthalidone and potassium supplement has been discontinued and she was placed on 5 mg daily of amlodipine. She is feeling better while on this current dose. Other than fasting lipid panel in December, I will also recommend a CMP as well to follow-up on the renal function and electrolyte.    Past Medical History:  Diagnosis Date  . Arthritis    rheumatoid  . CAD (coronary artery disease)    a. 1992 s/p MI and PTCA of unknown vessel;  b. 09/2010 Cath: LM nl, LAD 20p, LCX 33m, RCA 42m, RPL 20.  . Cancer ([place])   . Family history of anesthesia complication    daughter has difficulty waking   . GERD (gastroesophageal reflux disease)    occ  . Gout 10/08/2016  . Hyperlipidemia   . Hypertensive heart disease   . Nonischemic cardiomyopathy ([facility])    a. 07/2016 Echo: EF 40-45%, mild LVH, inferior akinesis, moderately dilated left atrium, trivial AI and MR.  Marland Kitchen PAF (paroxysmal atrial fibrillation) ([facility])    a. 07/2016 Admitted w/ AF RVR-->CHA2DS2VASc = 5-->Eliquis;  b. 07/2016 successful TEE/DCCV.  Marland Kitchen Sleep apnea    a. Using CPAP.  Past Surgical History:  Procedure Laterality Date  . ABDOMINAL HYSTERECTOMY  1988  . BACK SURGERY  1994  . CARDIAC CATHETERIZATION  1991 AND 1992   PTCA BY DR Lamount Cohen  . CARDIOVERSION N/A 08/01/2016   Procedure: CARDIOVERSION;  Surgeon: Lelon Perla, MD;  Location: [facility] ENDOSCOPY;  Service: Cardiovascular;  Laterality: N/A;  . ORIF ANKLE FRACTURE Right 04/27/2015   Procedure: OPEN REDUCTION INTERNAL FIXATION (ORIF) RIGHT BIMALLEOLAR ANKLE FRACTURE WITH  SYNDESMOSIS FIXATION;  Surgeon: Leandrew Koyanagi, MD;  Location: [facility];  Service: Orthopedics;  Laterality: Right;  . TEE WITHOUT CARDIOVERSION N/A 08/01/2016   Procedure: TRANSESOPHAGEAL ECHOCARDIOGRAM (TEE);  Surgeon: Lelon Perla, MD;  Location[facility] ENDOSCOPY;  Service: Cardiovascular;  Laterality: N/A;  . THYROIDECTOMY  11/24/2013   DR Dalbert Batman  . THYROIDECTOMY N/A 11/24/2013   Procedure: TOTAL THYROIDECTOMY;  Surgeon: Adin Hector, MD;  Location: [facility];  Service: General;  Laterality: N/A;  . TRANSTHORACIC ECHOCARDIOGRAM  01/15/2013   EF 55% TO 65%. PROBABLE MILD HYPOKINESIS OF THE INFERIOR MYOCARDIUM. GRADE 1 DIASTOLIC DYSFUNCTION. TRIAL AR.LA IS MILDLY DILATED.    Current Medications: Outpatient Medications Prior to Visit  Medication Sig Dispense Refill  . Acetaminophen (TYLENOL EXTRA STRENGTH PO) Take 2 tablets by mouth as needed.    Marland Kitchen amLODipine (NORVASC) 5 MG tablet Take 1 tablet (5 mg total) by mouth daily. 30 tablet 3  . apixaban (ELIQUIS) 5 MG TABS tablet Take 1 tablet (5 mg total) by mouth 2 (two) times daily. 60 tablet 0  . Biotin 10000 MCG TABS Take 1 tablet by mouth daily.    . Cholecalciferol (VITAMIN D-3 PO) Take 2,000 Units by mouth daily.     Marland Kitchen Cod Liver Oil 1000 MG CAPS Take 1 capsule by mouth daily.    . colchicine 0.6 MG tablet Use1 daily when necessary for gout flare; stop one flare is resolved and reuse when gout flare restarts 30 tablet 2  . diclofenac sodium (VOLTAREN) 1 % GEL Apply 2 g topically 4 (four) times daily. 3 Tube 0  . Febuxostat 80 MG TABS Take 1 tablet (80 mg total) by mouth daily. 30 tablet 5  . Glucosamine-Chondroit-Vit C-Mn (GLUCOSAMINE CHONDR 1500 COMPLX) CAPS Take 1 capsule daily by mouth.    . levothyroxine (SYNTHROID, LEVOTHROID) 100 MCG tablet Take 100 mcg daily before breakfast by mouth.    . metoprolol succinate (TOPROL-XL) 50 MG 24 hr tablet Take 1 tablet (50 mg total) by mouth daily. Take with or immediately following a meal. 30 tablet 1    . Probiotic Product (PROBIOTIC-10) CAPS Take 1 capsule by mouth daily.    . rosuvastatin (CRESTOR) 40 MG tablet Take 1 tablet (40 mg total) by mouth daily. 90 tablet 3  . ELIQUIS 5 MG TABS tablet TAKE ONE TABLET BY MOUTH TWICE DAILY (Patient not taking: Reported on 10/07/2017) 60 tablet 5  . Febuxostat (ULORIC) 80 MG TABS Take 80 mg by mouth daily.    . potassium chloride 20 MEQ TBCR Take 20 mEq by mouth daily. (Patient not taking: Reported on 10/07/2017) 30 tablet 5  . SYNTHROID 125 MCG tablet Take 1 tablet (125 mcg total) by mouth daily. Future refills must come from PCP or endocrinologist. (Patient not taking: Reported on 10/29/2017) 30 tablet 0   No facility-administered medications prior to visit.      Allergies:   Codeine   Social History   Socioeconomic History  . Marital status: Divorced    Spouse name: None  .  Number of children: None  . Years of education: None  . Highest education level: None  Social Needs  . Financial resource strain: None  . Food insecurity - worry: None  . Food insecurity - inability: None  . Transportation needs - medical: None  . Transportation needs - non-medical: None  Occupational History  . None  Tobacco Use  . Smoking status: Former Smoker    Packs/day: 1.00    Years: 15.00    Pack years: 15.00    Types: Cigarettes    Last attempt to quit: 12/10/1990    Years since quitting: 26.9  . Smokeless tobacco: Never Used  . Tobacco comment: occ alcohol  Substance and Sexual Activity  . Alcohol use: Yes    Comment: very rare  . Drug use: No  . Sexual activity: None  Other Topics Concern  . None  Social History Narrative  . None     Family History:  The patient's family history includes Bone cancer in her sister; Diabetes in her father; Heart disease in her brother and mother; Melanoma in her brother; Stroke in her father; Sudden death in her brother and mother.   ROS:   Please see the history of present illness.    ROS All other systems  reviewed and are negative.   PHYSICAL EXAM:   VS:  BP 130/84   Pulse 60   Ht 5\' 5"  (1.651 m)   Wt 230 lb (104.3 kg)   BMI 38.27 kg/m    GEN: Well nourished, well developed, in no acute distress  HEENT: normal  Neck: no JVD, carotid bruits, or masses Cardiac: RRR; no murmurs, rubs, or gallops,no edema  Respiratory:  clear to auscultation bilaterally, normal work of breathing GI: soft, nontender, nondistended, + BS MS: no deformity or atrophy  Skin: warm and dry, no rash Neuro:  Alert and Oriented x 3, Strength and sensation are intact Psych: euthymic mood, full affect  Wt Readings from Last 3 Encounters:  10/29/17 230 lb (104.3 kg)  10/07/17 230 lb (104.3 kg)  09/11/17 220 lb (99.8 kg)      Studies/Labs Reviewed:   EKG:  EKG is not ordered today.    Recent Labs: 08/27/2017: Hemoglobin 15.6; Platelets 268; TSH 0.457 09/20/2017: ALT 14; BUN 23; Creatinine, Ser 1.48; Potassium 3.9; Sodium 144   Lipid Panel    Component Value Date/Time   CHOL 177 09/20/2017 1202   TRIG 269 (H) 09/20/2017 1202   HDL 34 (L) 09/20/2017 1202   CHOLHDL 5.2 (H) 09/20/2017 1202   CHOLHDL 4.5 07/30/2016 0550   VLDL 26 07/30/2016 0550   LDLCALC 89 09/20/2017 1202    Additional studies/ records that were reviewed today include:   Myoview 08/09/2016 Study Highlights     The left ventricular ejection fraction is normal (55-65%).  Nuclear stress EF: 55%.  There was no ST segment deviation noted during stress.  Defect 1: There is a small defect of severe severity present in the basal inferolateral and mid inferolateral location.  Findings consistent with prior myocardial infarction.  This is a low risk study.  There is a small area, severe severity irreversible perfusion defect in the basal and mid inferolateral walls consistent an infarct in the LCX territory. No ischemia.       ASSESSMENT:    1. CAD in native artery   2. Hyperlipidemia, unspecified hyperlipidemia type   3.  AKI (acute kidney injury) ([facility])   4. Essential hypertension   5. NICM (nonischemic cardiomyopathy) (  [place])   6. PAF (paroxysmal atrial fibrillation) ([place])   7. Hypothyroidism, unspecified type      PLAN:  In order of problems listed above:  1. CAD: nonobstructive disease of previous cardiac catheterization, negative myoview in 2017.  2. Hypertension: blood pressure stable after addition of amlodipine. Was unable to tolerate chlorthalidone due to worsening renal function. Obtain CMP  3. Hyperlipidemia: on Crestor 40 mg daily. Due for fasting lipid panel  4. Hypothyroidism: Managed by primary care provider  5. PAF: On eliquis, maintaining sinus rhythm.    Medication Adjustments/Labs and Tests Ordered: Current medicines are reviewed at length with the patient today.  Concerns regarding medicines are outlined above.  Medication changes, Labs and Tests ordered today are listed in the Patient Instructions below. Patient Instructions  Medication Instructions:  Your physician recommends that you continue on your current medications as directed. Please refer to the Current Medication list given to you today.  Labwork: Your physician recommends that you return for lab work in: [place] in [place] 2018  Testing/Procedures: None   Follow-Up: Keep upcoming appointment with Dr Claiborne Billings as scheduled  Any Other Special Instructions Will Be Listed Below (If Applicable).  If you need a refill on your cardiac medications before your next appointment, please call your pharmacy.     Hilbert Corrigan, [place]  10/31/2017 8:00 AM    [address], [place], [place]  [address] Phone: [phone]; Fax: [phone]

## 2017-10-29 NOTE — Patient Instructions (Signed)
Medication Instructions:  Your physician recommends that you continue on your current medications as directed. Please refer to the Current Medication list given to you today.  Labwork: Your physician recommends that you return for lab work in: [place] in [place] 2018  Testing/Procedures: None   Follow-Up: Keep upcoming appointment with Dr Claiborne Billings as scheduled  Any Other Special Instructions Will Be Listed Below (If Applicable).  If you need a refill on your cardiac medications before your next appointment, please call your pharmacy.

## 2017-10-31 ENCOUNTER — Encounter: Payer: Self-pay | Admitting: Physician Assistant

## 2017-11-06 DIAGNOSIS — G4733 Obstructive sleep apnea (adult) (pediatric): Secondary | ICD-10-CM | POA: Diagnosis not present

## 2017-12-05 ENCOUNTER — Encounter: Payer: Self-pay | Admitting: *Deleted

## 2017-12-11 ENCOUNTER — Ambulatory Visit: Payer: PPO | Admitting: Family Medicine

## 2017-12-11 ENCOUNTER — Encounter: Payer: Self-pay | Admitting: Family Medicine

## 2017-12-11 VITALS — BP 126/86 | HR 63 | Ht 65.0 in | Wt 231.0 lb

## 2017-12-11 DIAGNOSIS — E890 Postprocedural hypothyroidism: Secondary | ICD-10-CM

## 2017-12-11 DIAGNOSIS — Z7689 Persons encountering health services in other specified circumstances: Secondary | ICD-10-CM | POA: Diagnosis not present

## 2017-12-11 DIAGNOSIS — E669 Obesity, unspecified: Secondary | ICD-10-CM

## 2017-12-11 DIAGNOSIS — Z9989 Dependence on other enabling machines and devices: Secondary | ICD-10-CM

## 2017-12-11 DIAGNOSIS — E7800 Pure hypercholesterolemia, unspecified: Secondary | ICD-10-CM | POA: Diagnosis not present

## 2017-12-11 DIAGNOSIS — I10 Essential (primary) hypertension: Secondary | ICD-10-CM | POA: Diagnosis not present

## 2017-12-11 DIAGNOSIS — G4733 Obstructive sleep apnea (adult) (pediatric): Secondary | ICD-10-CM | POA: Diagnosis not present

## 2017-12-11 DIAGNOSIS — Z5181 Encounter for therapeutic drug level monitoring: Secondary | ICD-10-CM

## 2017-12-11 DIAGNOSIS — I480 Paroxysmal atrial fibrillation: Secondary | ICD-10-CM | POA: Diagnosis not present

## 2017-12-11 NOTE — Progress Notes (Signed)
   Subjective:    Patient ID: Claudia Cantrell, female    DOB: 1946-06-30, 72 y.o.   MRN: [id number]  HPI Chief Complaint  Patient presents with  . Establish Care    new consult to establish care. Will be seeing Dr. Buddy Duty for endocrinology.   She is new to the practice and here to establish care. Previous medical care: Dr. Wallene Huh  Other providers:  Cardiology - Dr. Claiborne Billings Rheumatologist- Deveshwar manages her joint pain, gout, and  Endocrinologist- Dr. Carlis Abbott (retired) and has seen Dr. Dwyane Dee in the past. Now only having thyroid function managed.   HTN- diagnosed 20 years ago. States her cardiologist has been managing this and recently changed her medication. States BP is doing well.   A-fib- on Eliquis. Reports history of A-fib but has been in regular rhythm lately. Has upcoming appointment with her cardiologist. States they started her on potassium recently.   OSA on CPAP- uses CPAP nightly and is doing well with this. Reports good compliance. States she got new parts recently.   Hypothyroidism post thyroidectomy in 2014 by Dr. Dalbert Batman. States her endocrinologist was managing her TSH and has been changing her dose. Most recently taking 100 mcg Synthroid since November. Has not had labs checked since the dose change.   Hyperlipidemia history and is on statin.   Chronic ankle edema.   Diet has been poor over the holidays, states she ate a lot of sweets. Plans to eat healthier and start walking more.   Denies fever, chills, dizziness, chest pain, palpitations, shortness of breath, abdominal pain, N/V/D, urinary symptoms.   Reviewed allergies, medications, past medical, surgical, and social history.    Review of Systems Pertinent positives and negatives in the history of present illness.     Objective:   Physical Exam BP 126/86   Pulse 63   Ht 5\' 5"  (1.651 m)   Wt 231 lb (104.8 kg)   SpO2 98%   BMI 38.44 kg/m   Alert and in no distress. Pharyngeal area is normal.   Cardiac exam shows a regular sinus rhythm without murmurs or gallops. Lungs are clear to auscultation.      Assessment & Plan:  Essential hypertension, benign  OSA on CPAP  Paroxysmal atrial fibrillation (HCC)  Pure hypercholesterolemia  Obesity (BMI 30-39.9)  Encounter to establish care  Postsurgical hypothyroidism - Plan: TSH  Medication monitoring encounter - Plan: TSH  BP is close to goal range. Continue on current medication regimen. She is following up with her cardiologist for this as well since they recently changed her medication.  Reports good compliance and benefits from daily use of CPAP.  Reports she is trying to lose weight.  Would like her thyroid function checked. Started 100 mcg Synthroid 2 months ago approximately. Dose was changed from 125 mcg. Check TSH and change dose as appropriate.  Appears to be up to date on immunizations including flu, pneumonia and shingles.  She will return for fasting labs for lipid panel in February.

## 2017-12-12 ENCOUNTER — Other Ambulatory Visit: Payer: Self-pay | Admitting: Family Medicine

## 2017-12-12 ENCOUNTER — Telehealth: Payer: Self-pay

## 2017-12-12 DIAGNOSIS — I2510 Atherosclerotic heart disease of native coronary artery without angina pectoris: Secondary | ICD-10-CM | POA: Diagnosis not present

## 2017-12-12 DIAGNOSIS — N179 Acute kidney failure, unspecified: Secondary | ICD-10-CM | POA: Diagnosis not present

## 2017-12-12 DIAGNOSIS — E785 Hyperlipidemia, unspecified: Secondary | ICD-10-CM | POA: Diagnosis not present

## 2017-12-12 LAB — COMPREHENSIVE METABOLIC PANEL
ALBUMIN: 4.5 g/dL (ref 3.6–4.8)
ALK PHOS: 128 IU/L — AB (ref 39–117)
ALT: 17 IU/L (ref 0–32)
AST: 18 IU/L (ref 0–40)
Albumin/Globulin Ratio: 1.2 (ref 1.2–2.2)
BUN / CREAT RATIO: 14 (ref 12–28)
BUN: 15 mg/dL (ref 8–27)
Bilirubin Total: 0.6 mg/dL (ref 0.0–1.2)
CO2: 22 mmol/L (ref 20–29)
CREATININE: 1.05 mg/dL — AB (ref 0.57–1.00)
Calcium: 9.5 mg/dL (ref 8.7–10.3)
Chloride: 105 mmol/L (ref 96–106)
GFR calc Af Amer: 63 mL/min/{1.73_m2} (ref >59)
GFR, EST NON AFRICAN AMERICAN: 54 mL/min/{1.73_m2} — AB (ref >59)
GLOBULIN, TOTAL: 3.7 g/dL (ref 1.5–4.5)
Glucose: 82 mg/dL (ref 65–99)
POTASSIUM: 4.1 mmol/L (ref 3.5–5.2)
SODIUM: 142 mmol/L (ref 134–144)
Total Protein: 8.2 g/dL (ref 6.0–8.5)

## 2017-12-12 LAB — LIPID PANEL
Chol/HDL Ratio: 4 ratio (ref 0.0–4.4)
Cholesterol, Total: 169 mg/dL (ref 100–199)
HDL: 42 mg/dL (ref >39)
LDL CALC: 95 mg/dL (ref 0–99)
TRIGLYCERIDES: 161 mg/dL — AB (ref 0–149)
VLDL CHOLESTEROL CAL: 32 mg/dL (ref 5–40)

## 2017-12-12 LAB — TSH: TSH: 7.82 m[IU]/L — AB (ref 0.40–4.50)

## 2017-12-12 MED ORDER — LEVOTHYROXINE SODIUM 112 MCG PO TABS: 112.0000 ug | ORAL_TABLET | Freq: Every day | ORAL | 2 refills | Status: DC

## 2017-12-12 NOTE — Telephone Encounter (Signed)
-----   Message from Girtha Rm, NP-C sent at 12/12/2017 10:47 AM EST ----- Please let her know that we can increase her dose slightly to 112 mcg.  I will send this to her pharmacy and let us have her follow-up in 2 months for a repeat thyroid function.

## 2017-12-12 NOTE — Progress Notes (Signed)
Several lab work has improved. Total cholesterol, good cholesterol, triglyceride and kidney function all improving. Bad cholesterol (LDL) is still not where she needs to be, goal < 70. However continue on current therapy given improvement.

## 2017-12-12 NOTE — Telephone Encounter (Signed)
-----   Message from Girtha Rm, NP-C sent at 12/12/2017  8:07 AM EST ----- Her TSH or thyroid function test is mildly elevated.  Please ask her if she has missed any doses, is taking it on an empty stomach and not eating or taking any other medications for at least 30-45 minutes after her Synthroid?  If she has not been taking it appropriately then we can give her another month or 2 and then recheck it.  I believe she was taking the next higher dose prior to the current dose and this was making her TSH too low.  Is that correct?

## 2017-12-12 NOTE — Telephone Encounter (Signed)
Called patient and she advised that she had not missed any doses other than one (on Sunday), she takes it first thing in the morning, has coffee about half and hour after taking it. She states that she took 125 mcg dose, but Dr.Clark changed to 100 mcg dose at the first of November.

## 2017-12-12 NOTE — Progress Notes (Signed)
   Subjective:    Patient ID: Claudia Cantrell, female    DOB: 12-08-1946, 72 y.o.   MRN: [id number]  HPI    Review of Systems     Objective:   Physical Exam        Assessment & Plan:

## 2017-12-12 NOTE — Telephone Encounter (Signed)
Called and advised patient that we had submitted new dose of medication. Patient understood and had no questions.

## 2017-12-13 ENCOUNTER — Telehealth: Payer: Self-pay | Admitting: *Deleted

## 2017-12-13 NOTE — Telephone Encounter (Signed)
-----   Message from [place], [place] sent at 12/12/2017  4:43 PM EST ----- Several lab work has improved. Total cholesterol, good cholesterol, triglyceride and kidney function all improving. Bad cholesterol (LDL) is still not where she needs to be, goal < 70. However continue on current therapy given improvement.

## 2017-12-13 NOTE — Telephone Encounter (Signed)
Left a message to call back to discuss the lab results.

## 2017-12-16 ENCOUNTER — Encounter: Payer: Self-pay | Admitting: Cardiovascular Disease

## 2017-12-16 ENCOUNTER — Ambulatory Visit (INDEPENDENT_AMBULATORY_CARE_PROVIDER_SITE_OTHER): Payer: PPO | Admitting: Cardiovascular Disease

## 2017-12-16 VITALS — BP 162/84 | HR 57 | Ht 65.0 in | Wt 233.0 lb

## 2017-12-16 DIAGNOSIS — Z6838 Body mass index (BMI) 38.0-38.9, adult: Secondary | ICD-10-CM

## 2017-12-16 DIAGNOSIS — I2510 Atherosclerotic heart disease of native coronary artery without angina pectoris: Secondary | ICD-10-CM | POA: Diagnosis not present

## 2017-12-16 DIAGNOSIS — G4733 Obstructive sleep apnea (adult) (pediatric): Secondary | ICD-10-CM

## 2017-12-16 DIAGNOSIS — E039 Hypothyroidism, unspecified: Secondary | ICD-10-CM

## 2017-12-16 DIAGNOSIS — I480 Paroxysmal atrial fibrillation: Secondary | ICD-10-CM | POA: Diagnosis not present

## 2017-12-16 DIAGNOSIS — E785 Hyperlipidemia, unspecified: Secondary | ICD-10-CM

## 2017-12-16 DIAGNOSIS — I428 Other cardiomyopathies: Secondary | ICD-10-CM | POA: Diagnosis not present

## 2017-12-16 MED ORDER — AMLODIPINE BESYLATE 5 MG PO TABS: 7.5000 mg | ORAL_TABLET | Freq: Every day | ORAL | 3 refills | Status: DC

## 2017-12-16 NOTE — Patient Instructions (Signed)
Medication Instructions:  INCREASE amlodipine to 7.5 mg daily  Follow-Up: Your physician recommends that you schedule a follow-up appointment in: 3 months with Dr. Claiborne Billings   If you need a refill on your cardiac medications before your next appointment, please call your pharmacy.

## 2017-12-16 NOTE — Progress Notes (Signed)
Patient ID: Claudia Cantrell, female   DOB: Dec 26, 1945, 72 y.o.   MRN: [id number]     HPI: Claudia Cantrell is a 72 y.o. female who presents to the office today for one-year follow-up evaluation.  Claudia Cantrell suffered remote myocardial infarctions in 1991 and 1992 and at that time underwent PTCA by Claudia. Lamount Cantrell.  She has a history of hypertension, hyperlipidemia, obstructive sleep apnea on CPAP therapy, obesity, and hypothyroidism with multinodular goiter. In October 2011 she underwent cardiac catheterization after she had experienced episodes of chest pain. Catheterization showed preserved LV contractility although the was a very small focal area of severe hypocontractility involving the mid inferior wall as well as low posterior lateral wall in this patient status post remote myocardial infarction approximately 20 years ago treated with PTCA. At the time of her catheterization she did not have significant carotid obstructive disease and only had mild 10-20% narrowing at the ostium of the LAD, 20% irregularity in the mid AV groove circumflex, and 20% mid RCA and 20% posterior lateral irregularities. Subsequently, she has done well from a cardiovascular standpoint.  She is a history of a multinodular goiter and in 2014, underwent thyroidectomy and tolerated this well from a cardiovascular standpoint.  Since I saw her, she broke her leg in 2016.  She has noticed some increasing shortness of breath particularly when she eats chocolate.  She has been followed by Claudia. Jeanann Cantrell, for primary care.  She developed atrial fibrillation this year and was admitted August 2017 with AF with rapid ventricular response.  With a cha2ds2vasc score of 5.  She was started on eliquis..  She underwent successful TEE guided cardioversion with restoration of sinus rhythm.  She saw Claudia Cantrell on 08/08/2016 for hospital follow-up and was doing well.  An echo Doppler study in the setting of AF showed an ejection fraction of  40-45%.  She underwent a nuclear perfusion study on August 30 117, which was low risk and suggested the possibility of a small basal to mid inferolateral defect.  There was no ischemia.  She has obstructive sleep apnea and has been on CPAP therapy since 2011.  When I saw her in October 2017, her CPAP machine was broken.  She was 100% compliant and on her prior sleep study.  She had been utilizing CPAP at 9 cm water pressure.    She received a new Medical illustrator  Sense 10 AutoSet for her CPAP unit from [employer] care as a DME company.  Two-month of compliance data were reviewed.  11/06/2016 through 12/05/2016 usage stays was 97% and usage greater than 4 hours was 97%.  She was averaging 7 hours and 9 minutes.  AHI was excellent at 0.9 at her 9 cm water pressure.  There was no leak.  She had had issues with intermittent function over new machine, which was under warranty.  This has been followed up with her DME company.   Since I last saw her, she has been seen by Claudia Cantrell in September, October, and November 2018.  She has had issues with lower extremity edema and some chest congestion.  She had been using NyQuil.  Her levothyroxine dose was increased. .  She has been taking amlodipine 5 mg, Toprol-XL 50 mg for hypertension.  Levothyroxine 112 g for hypothyroidism.  She is on eliquis and denies any recent awareness of abnormal heart rhythm and is unaware of any recurrent atrial fibrillation.  She is on rosuvastatin 40 mg for hyperlipidemia.  In  the office today a download was obtained from 11/16/2017 through 12/15/2017.  This showed 93% of usage stays with average use is at 7 hours and 26 minutes.  She is at 9 cm water pressure.  AHI is excellent at 0.7.  There is some occasional leaking her mask.  Past Medical History:  Diagnosis Date  . Arthritis    rheumatoid  . CAD (coronary artery disease)    a. 1992 s/p MI and PTCA of unknown vessel;  b. 09/2010 Cath: LM nl, LAD 20p, LCX 37m RCA 161mRPL 20.  . [place]  ([place]  . Family history of anesthesia complication    daughter has difficulty waking   . GERD (gastroesophageal reflux disease)    occ  . Gout 10/08/2016  . Hyperlipidemia   . Hypertensive heart disease   . Nonischemic cardiomyopathy ([facility]   a. 07/2016 Echo: EF 40-45%, mild LVH, inferior akinesis, moderately dilated left atrium, trivial AI and MR.  . Marland KitchenAF (paroxysmal atrial fibrillation) ([facility] [place]   a. 07/2016 Admitted w/ AF RVR-->CHA2DS2VASc = 5-->Eliquis;  b. 07/2016 successful TEE/DCCV.  . Marland Kitchenleep apnea    a. Using CPAP.    Past Surgical History:  Procedure Laterality Date  . ABDOMINAL HYSTERECTOMY  1988  . BACK SURGERY  1994  . CARDIAC CATHETERIZATION  1991 AND 1992   PTCA BY Claudia CHLamount Cantrell. CARDIOVERSION N/A 08/01/2016   Procedure: CARDIOVERSION;  Surgeon: Claudia Cantrell;  [facility];  Service: Cardiovascular;  Laterality: N/A;  . ORIF ANKLE FRACTURE Right 04/27/2015   Procedure: OPEN REDUCTION INTERNAL FIXATION (ORIF) RIGHT BIMALLEOLAR ANKLE FRACTURE WITH SYNDESMOSIS FIXATION;  Surgeon: Claudia KoyanagiMD;  [facility]: [facility]ics;  Laterality: Right;  . TEE WITHOUT CARDIOVERSION N/A 08/01/2016   Procedure: TRANSESOPHAGEAL ECHOCARDIOGRAM (TEE);  Surgeon: Claudia Cantrell;  [facility];  Service: Cardiovascular;  Laterality: N/A;  . THYROIDECTOMY  11/24/2013   Claudia Cantrell. THYROIDECTOMY N/A 11/24/2013   Procedure: TOTAL THYROIDECTOMY;  Surgeon: HaAdin HectorMD;  [facility];  Laterality: N/A;  . TRANSTHORACIC ECHOCARDIOGRAM  01/15/2013   EF 55% TO 65%. PROBABLE MILD HYPOKINESIS OF THE INFERIOR MYOCARDIUM. GRADE 1 DIASTOLIC DYSFUNCTION. TRIAL AR.LA IS MILDLY DILATED.    Allergies  Allergen Reactions  . Codeine Anxiety    Current Outpatient Medications  Medication Sig Dispense Refill  . Acetaminophen (TYLENOL EXTRA STRENGTH PO) Take 2 tablets by mouth as needed.    . Marland KitchenmLODipine (NORVASC) 5 MG tablet  Take 1.5 tablets (7.5 mg total) by mouth daily. 135 tablet 3  . Biotin 10000 MCG TABS Take 1 tablet by mouth daily.    . Cholecalciferol (VITAMIN D-3 PO) Take 2,000 Units by mouth daily.     . Marland Kitchenod Liver Oil 1000 MG CAPS Take 1 capsule by mouth daily.    . colchicine 0.6 MG tablet Take 1 tablet by mouth daily as needed.    . diclofenac sodium (VOLTAREN) 1 % GEL Apply 2 g topically 4 (four) times daily. 3 Tube 0  . ELIQUIS 5 MG TABS tablet TAKE ONE TABLET BY MOUTH TWICE DAILY 60 tablet 5  . Febuxostat (ULORIC) 80 MG TABS Take 80 mg by mouth daily.    . Glucosamine-Chondroit-Vit C-Mn (GLUCOSAMINE CHONDR 1500 COMPLX) CAPS Take 1 capsule daily by mouth.    . levothyroxine (SYNTHROID, LEVOTHROID) 112 MCG tablet Take 1 tablet (112 mcg total) by mouth daily. 30 tablet 2  .  metoprolol succinate (TOPROL-XL) 50 MG 24 hr tablet Take 1 tablet (50 mg total) by mouth daily. Take with or immediately following a meal. 30 tablet 1  . Phenyleph-Doxylamine-DM-APAP (NYQUIL SEVERE COLD/FLU) 5-6.25-10-325 MG/15ML LIQD Take 2 tablets by mouth 4 (four) times daily.    . Probiotic Product (PROBIOTIC-10) CAPS Take 1 capsule by mouth daily.    . rosuvastatin (CRESTOR) 40 MG tablet Take 1 tablet (40 mg total) by mouth daily. 90 tablet 3   No current facility-administered medications for this visit.     Social History   Socioeconomic History  . Marital status: Widowed    Spouse name: Not on file  . Number of children: Not on file  . Years of education: Not on file  . Highest education level: Not on file  Social Needs  . Financial resource strain: Not on file  . Food insecurity - worry: Not on file  . Food insecurity - inability: Not on file  . Transportation needs - medical: Not on file  . Transportation needs - non-medical: Not on file  Occupational History  . Not on file  Tobacco Use  . Smoking status: Former Smoker    Packs/day: 1.00    Years: 15.00    Pack years: 15.00    Types: Cigarettes    Last  attempt to quit: 12/10/1990    Years since quitting: 27.0  . Smokeless tobacco: Never Used  . Tobacco comment: occ alcohol  Substance and Sexual Activity  . Alcohol use: Yes    Comment: very rare  . Drug use: No  . Sexual activity: Not on file  Other Topics Concern  . Not on file  Social History Narrative  . Not on file    Family History  Problem Relation Age of Onset  . Heart disease Mother   . Sudden death Mother   . Diabetes Father   . Stroke Father   . Bone cancer Sister   . Sudden death Brother   . Melanoma Brother   . Heart disease Brother    Social history is notable that she is widowed and has 2 children plus one adopted child as well as 2 grandchildren. She does not routinely exercise. There is no tobacco or alcohol use.   ROS General: Negative; No fevers, chills, or night sweats;  HEENT: Negative; No changes in vision or hearing, sinus congestion, difficulty swallowing Pulmonary: Negative; No cough, wheezing, shortness of breath, hemoptysis Cardiovascular: No recent chest pain, dyspnea. GI: Negative; No nausea, vomiting, diarrhea, or abdominal pain GU: Negative; No dysuria, hematuria, or difficulty voiding Musculoskeletal: Negative; no myalgias, joint pain, or weakness Hematologic/Oncology: Negative; no easy bruising, bleeding Endocrine: History of multinodular goiter status post thyroidectomy Neuro: Negative; no changes in balance, headaches Skin: Negative; No rashes or skin lesions Psychiatric: Negative; No behavioral problems, depression Sleep: Positive obstructive sleep apnea.  She admits to 100% compliance.  Her machine is broken.Other comprehensive 14 point system review is negative.   PE BP (!) 162/84   Pulse (!) 57   Ht '5\' 5"'  (1.651 m)   Wt 233 lb (105.7 kg)   BMI 38.77 kg/m    Repeat blood pressure was 148/84  Wt Readings from Last 3 Encounters:  12/16/17 233 lb (105.7 kg)  12/11/17 231 lb (104.8 kg)  10/29/17 230 lb (104.3 kg)      Physical Exam BP (!) 162/84   Pulse (!) 57   Ht '5\' 5"'  (1.651 m)   Wt 233 lb (105.7 kg)  BMI 38.77 kg/m  General: Alert, oriented, no distress.  Skin: normal turgor, no rashes, warm and dry HEENT: Normocephalic, atraumatic. Pupils equal round and reactive to light; sclera anicteric; extraocular muscles intact;  Nose without nasal septal hypertrophy Mouth/Parynx benign; Mallinpatti scale 3 Neck: No JVD, no carotid bruits; normal carotid upstroke Lungs: clear to ausculatation and percussion; no wheezing or rales Chest wall: without tenderness to palpitation Heart: PMI not displaced, RRR, s1 s2 normal, 1/6 systolic murmur, no diastolic murmur, no rubs, gallops, thrills, or heaves Abdomen: soft, nontender; no hepatosplenomegaly, BS+; abdominal aorta nontender and not dilated by palpation. Back: no CVA tenderness Pulses 2+ Musculoskeletal: full range of motion, normal strength, no joint deformities Extremities: no clubbing cyanosis or edema, Homan's sign negative  Neurologic: grossly nonfocal; Cranial nerves grossly wnl Psychologic: Normal mood and affect  ECG (independently read by me): Sinus bradycardia 57 bpm.  Q wave in lead 3.  Poor R wave progression V1 through V3.  October  2017 ECG (independently read by me): Sinus bradycardia 52 bpm.  Poor anterior R-wave progression.  Normal intervals.  October 2014 ECG: Sinus rhythm at 69 beats per minute. Normal intervals. Nonspecific T changes.  LABS:  BMP Latest Ref Rng & Units 12/12/2017 09/20/2017 09/11/2017  Glucose 65 - 99 mg/dL 82 88 127(H)  BUN 8 - 27 mg/dL '15 23 15  ' Creatinine 0.57 - 1.00 mg/dL 1.05(H) 1.48(H) 0.75  BUN/Creat Ratio 12 - '28 14 16 20  ' Sodium 134 - 144 mmol/L 142 144 140  Potassium 3.5 - 5.2 mmol/L 4.1 3.9 3.2(L)  Chloride 96 - 106 mmol/L 105 102 98  CO2 20 - 29 mmol/L '22 21 25  ' Calcium 8.7 - 56.3 mg/dL 9.5 9.8 9.6   Hepatic Function Latest Ref Rng & Units 12/12/2017 09/20/2017 08/27/2017  Total Protein 6.0  - 8.5 g/dL 8.2 8.1 7.6  Albumin 3.6 - 4.8 g/dL 4.5 4.5 4.2  AST 0 - 40 IU/L '18 19 19  ' ALT 0 - 32 IU/L '17 14 13  ' Alk Phosphatase 39 - 117 IU/L 128(H) 132(H) 128(H)  Total Bilirubin 0.0 - 1.2 mg/dL 0.6 0.4 0.5  Bilirubin, Direct 0.00 - 0.40 mg/dL - 0.12 -   CBC Latest Ref Rng & Units 08/27/2017 04/09/2017 10/09/2016  WBC 3.4 - 10.8 x10E3/uL 3.1(L) 4.5 3.5(L)  Hemoglobin 11.1 - 15.9 g/dL 15.6 14.7 15.0  Hematocrit 34.0 - 46.6 % 46.3 45.5(H) 45.8(H)  Platelets 150 - 379 x10E3/uL 268 290 248   Lab Results  Component Value Date   MCV 87 08/27/2017   MCV 88.9 04/09/2017   MCV 89.8 10/09/2016   Lab Results  Component Value Date   TSH 7.82 (H) 12/11/2017    Lipid Panel     Component Value Date/Time   CHOL 169 12/12/2017 1141   TRIG 161 (H) 12/12/2017 1141   HDL 42 12/12/2017 1141   CHOLHDL 4.0 12/12/2017 1141   CHOLHDL 4.5 07/30/2016 0550   VLDL 26 07/30/2016 0550   LDLCALC 95 12/12/2017 1141   IMPRESSION:  1. CAD in native artery   2. NICM (nonischemic cardiomyopathy) ([place])   3. PAF (paroxysmal atrial fibrillation) ([place])   4. Hyperlipidemia, unspecified hyperlipidemia type   5. OSA (obstructive sleep apnea)   6. Hypothyroidism, unspecified type   7. Class 2 severe obesity due to excess calories with serious comorbidity and body mass index (BMI) of 38.0 to 38.9 in adult [facility])       ASSESSMENT AND PLAN: Claudia Cantrell is 72 year-old African  American female who suffered a remote myocardial infarction  and underwent PTCA by Claudia. Sherald Barge 27 years ago.  Cardiac catheterization in 2011 demonstrated a small focal wall motion abnormality in the inferior and posterior lateral wall concordant with his myocardial infarction. She has mild nonobstructive CAD. Her last recent nuclear perfusion study shows an ejection fraction of 55% and findings consistent with a small area of scar in the basal and mid inferolateral wall concordant with her left circumflex infarct.  She developed atrial  fibrillation in August 2017 and underwent successful TEE cardioversion.  She is on eliquis anticoagulation and has been maintaining sinus rhythm.  .  She received a new CPAP machine this year and is using this well.  I obtained a download in the office today.  She is meeting compliance standards.  AHI is excellent at 0.7/h at a set pressure of 9 cm.  She has had some issues with chest congestion and has been taking NyQuil.  Her blood pressure today is elevated.  I have suggested slight titration of amlodipine from 5 mg up to 7.5 mg daily and she will continue with her current dose of Toprol-XL 50 mg.  She has not had any recurrent atrial fibrillation which is stable.  There is no bleeding on eliquis.  She continues to be on rosuvastatin for hyperlipidemia with target LDL less than 70.  Her thyroid dose was recently increased up to 112 g by her primary physician.  I will see her in 3 months for reevaluation.    Time spent: 25 minutes  Troy Sine, Cantrell, [facility]  12/23/2017 6:39 PM

## 2017-12-23 ENCOUNTER — Encounter: Payer: Self-pay | Admitting: Cardiovascular Disease

## 2018-01-02 ENCOUNTER — Other Ambulatory Visit: Payer: Self-pay | Admitting: Cardiovascular Disease

## 2018-01-12 NOTE — Progress Notes (Addendum)
Claudia Cantrell is a 72 y.o. female who presents for annual wellness visit and follow-up on chronic medical conditions.  She has the following concerns: None.  She is resting better and feeling better since increasing her levothyroxine. Hair is thickening up again. She is happy about this.   She has gained some weight.   Increased dose of levothyroxine 4 weeks ago. Will need to recheck TSH.   History of Hgb A1c 6.2% in May 2017 per records.   Reports nightly use of CPAP and is doing well with this.    Immunization History  Administered Date(s) Administered  . Influenza, High Dose Seasonal PF 12/30/2014, 10/14/2017  . Influenza,inj,Quad PF,6+ Mos 11/25/2013  . Pneumococcal Conjugate-13 12/30/2014, 10/14/2017  . Pneumococcal Polysaccharide-23 11/25/2013  . Zoster Recombinat (Shingrix) 10/14/2017   Last Pap smear:had hystectomy Last mammogram: 04/2016. Has this at [facility].  Last colonoscopy: 4-5 years ago at [place].  Last DEXA: 04/2016 and normal.   Dentist: haven't seen once in a while Ophtho: trying to find a new eye doctor Exercise: has not been excerising  Other doctors caring for patient include:  Dr. Kathi Ludwig- rheumatology Dr. Claiborne Billings- cardiology [facility] Dr. Garwin Brothers- OB/GYN  Depression screen:  See questionnaire below.  Depression screen [facility] 2/9 01/13/2018 12/11/2017 08/29/2016  Decreased Interest 0 0 0  Down, Depressed, Hopeless 0 0 0  PHQ - 2 Score 0 0 0    Fall Risk Screen: see questionnaire below. Fall Risk  01/13/2018 08/29/2016  Falls in the past year? No No    ADL screen:  See questionnaire below Functional Status Survey: Is the patient deaf or have difficulty hearing?: No Does the patient have difficulty seeing, even when wearing glasses/contacts?: No Does the patient have difficulty concentrating, remembering, or making decisions?: No Does the patient have difficulty walking or climbing stairs?: No Does the patient have difficulty dressing or bathing?:  No Does the patient have difficulty doing errands alone such as visiting a doctor's office or shopping?: No   End of Life Discussion:  Patient does not have a living will and medical power of attorney. Her daughter is her next of kin.   Review of Systems Constitutional: -fever, -chills, -sweats, -unexpected weight change, -anorexia, -fatigue Allergy: -sneezing, -itching, -congestion Dermatology: denies changing moles, rash, lumps, new worrisome lesions ENT: -runny nose, -ear pain, -sore throat, -hoarseness, -sinus pain, -teeth pain, -tinnitus, -hearing loss, -epistaxis Cardiology:  -chest pain, -palpitations, -edema, -orthopnea, -paroxysmal nocturnal dyspnea Respiratory: -cough, -shortness of breath, -dyspnea on exertion, -wheezing, -hemoptysis Gastroenterology: -abdominal pain, -nausea, -vomiting, -diarrhea, -constipation, -blood in stool, -changes in bowel movement, -dysphagia Hematology: -bleeding or bruising problems Musculoskeletal: -arthralgias, -myalgias, -joint swelling, -back pain, -neck pain, -cramping, -gait changes Ophthalmology: -vision changes, -eye redness, -itching, -discharge Urology: -dysuria, -difficulty urinating, -hematuria, -urinary frequency, -urgency, incontinence Neurology: -headache, -weakness, -tingling, -numbness, -speech abnormality, -memory loss, -falls, -dizziness Psychology:  -depressed mood, -agitation, -sleep problems    PHYSICAL EXAM:  BP 120/70   Pulse (!) 54   Ht 5\' 5"  (1.651 m)   Wt 233 lb 3.2 oz (105.8 kg)   BMI 38.81 kg/m   General Appearance: Alert, cooperative, no distress, appears stated age Head: Normocephalic, without obvious abnormality, atraumatic Eyes: PERRL, conjunctiva/corneas clear, EOM's intact, fundi benign Ears: Normal TM's and external ear canals Nose: Nares normal, mucosa normal, no drainage or sinus   tenderness Throat: Lips, mucosa, and tongue normal; teeth and gums normal Neck: Supple, no lymphadenopathy; thyroid:  partial removal, no enlargement/tenderness/nodules; no carotid bruit or  JVD Back: Spine nontender, no curvature, ROM normal, no CVA tenderness Lungs: Clear to auscultation bilaterally without wheezes, rales or ronchi; respirations unlabored Chest Wall: No tenderness or deformity Heart: Regular rate and rhythm, S1 and S2 normal.  Breast Exam: declines. Done at OB//GYN. Mammogram ordered.  Abdomen: Soft, non-tender, nondistended, normoactive bowel sounds, no masses, no hepatosplenomegaly Genitalia: declines. Done at OB/GYN.  Extremities: No clubbing, cyanosis or edema Pulses: 2+ and symmetric all extremities Skin: Skin color, texture, turgor normal, no rashes or lesions Lymph nodes: Cervical, supraclavicular, and axillary nodes normal Neurologic: CNII-XII intact, normal strength, sensation and gait; reflexes 2+ and symmetric throughout Psych: Normal mood, affect, hygiene and grooming.  ASSESSMENT/PLAN: Medicare annual wellness visit, subsequent - Plan: POCT Urinalysis DIP (Proadvantage Device)  Postsurgical hypothyroidism - Plan: TSH  OSA on CPAP  Prediabetes - Plan: Hemoglobin A1c  Need for hepatitis C screening test - Plan: Hepatitis C antibody  Medication management - Plan: TSH, Uric acid  Hyperuricemia - Plan: Uric acid  Idiopathic chronic gout of multiple sites without tophus - Plan: Uric acid  Isolated proteinuria with morphologic lesion  UA dipstick: 2+ protein, blood 1+  Follow up in 4-6 weeks.  Review of medical records from previous PCP shows history of prediabetes and this apparently has not bee rechecked. Hgb A1c ordered.  Counseled on weight loss by cutting back on carbohydrates and sugar.  Will check uric acid and adjust medication as needed.  Recheck TSH and adjust medication if needed.  She will call and schedule mammogram.  Call and schedule with [facility].  One time Hep C test done.  No sign of mood disorder.   Nightly use of CPAP for OSA and is benefiting  from this.    Discussed monthly self breast exams and yearly mammograms; at least 30 minutes of aerobic activity at least 5 days/week and weight-bearing exercise 2x/week; proper sunscreen use reviewed; healthy diet, including goals of calcium and vitamin D intake and alcohol recommendations (less than or equal to 1 drink/day) reviewed; regular seatbelt use; changing batteries in smoke detectors.  Immunization recommendations discussed.  Colonoscopy recommendations reviewed   Medicare Attestation I have personally reviewed: The patient's medical and social history Their use of alcohol, tobacco or illicit drugs Their current medications and supplements The patient's functional ability including ADLs,fall risks, home safety risks, cognitive, and hearing and visual impairment Diet and physical activities Evidence for depression or mood disorders  The patient's weight, height, and BMI have been recorded in the chart.  I have made referrals, counseling, and provided education to the patient based on review of the above and I have provided the patient with a written personalized care plan for preventive services.     Harland Dingwall, NP-C   01/13/2018

## 2018-01-13 ENCOUNTER — Other Ambulatory Visit: Payer: Self-pay | Admitting: Family Medicine

## 2018-01-13 ENCOUNTER — Encounter: Payer: Self-pay | Admitting: Family Medicine

## 2018-01-13 ENCOUNTER — Ambulatory Visit (INDEPENDENT_AMBULATORY_CARE_PROVIDER_SITE_OTHER): Payer: PPO | Admitting: Family Medicine

## 2018-01-13 VITALS — BP 120/70 | HR 54 | Ht 65.0 in | Wt 233.2 lb

## 2018-01-13 DIAGNOSIS — G4733 Obstructive sleep apnea (adult) (pediatric): Secondary | ICD-10-CM

## 2018-01-13 DIAGNOSIS — M1A09X0 Idiopathic chronic gout, multiple sites, without tophus (tophi): Secondary | ICD-10-CM | POA: Diagnosis not present

## 2018-01-13 DIAGNOSIS — Z1159 Encounter for screening for other viral diseases: Secondary | ICD-10-CM

## 2018-01-13 DIAGNOSIS — E890 Postprocedural hypothyroidism: Secondary | ICD-10-CM | POA: Diagnosis not present

## 2018-01-13 DIAGNOSIS — N069 Isolated proteinuria with unspecified morphologic lesion: Secondary | ICD-10-CM

## 2018-01-13 DIAGNOSIS — Z0000 Encounter for general adult medical examination without abnormal findings: Secondary | ICD-10-CM

## 2018-01-13 DIAGNOSIS — R7303 Prediabetes: Secondary | ICD-10-CM | POA: Diagnosis not present

## 2018-01-13 DIAGNOSIS — Z9989 Dependence on other enabling machines and devices: Secondary | ICD-10-CM

## 2018-01-13 DIAGNOSIS — Z79899 Other long term (current) drug therapy: Secondary | ICD-10-CM | POA: Diagnosis not present

## 2018-01-13 DIAGNOSIS — E790 Hyperuricemia without signs of inflammatory arthritis and tophaceous disease: Secondary | ICD-10-CM

## 2018-01-13 LAB — POCT URINALYSIS DIP (PROADVANTAGE DEVICE)
BILIRUBIN UA: NEGATIVE
Glucose, UA: NEGATIVE mg/dL
Ketones, POC UA: NEGATIVE mg/dL
LEUKOCYTES UA: NEGATIVE
NITRITE UA: NEGATIVE
Specific Gravity, Urine: 1.025
Urobilinogen, Ur: NEGATIVE
pH, UA: 6 (ref 5.0–8.0)

## 2018-01-13 NOTE — Patient Instructions (Addendum)
Call [facility] and schedule your mammogram.   Call [facility] and schedule an appointment to discuss your colonoscopy.   We will call you with your lab results.   Return in 4-6 weeks for protein in urine.

## 2018-01-14 LAB — URIC ACID: URIC ACID: 1.3 mg/dL — AB (ref 2.5–7.1)

## 2018-01-14 LAB — HEMOGLOBIN A1C
Est. average glucose Bld gHb Est-mCnc: 126 mg/dL
Hgb A1c MFr Bld: 6 % — ABNORMAL HIGH (ref 4.8–5.6)

## 2018-01-14 LAB — TSH: TSH: 1.62 u[IU]/mL (ref 0.450–4.500)

## 2018-01-14 LAB — HEPATITIS C ANTIBODY: Hep C Virus Ab: <0.1 s/co ratio (ref 0.0–0.9)

## 2018-01-14 LAB — SPECIMEN STATUS REPORT

## 2018-01-20 ENCOUNTER — Encounter: Payer: Self-pay | Admitting: Family Medicine

## 2018-02-07 ENCOUNTER — Other Ambulatory Visit: Payer: Self-pay | Admitting: Cardiovascular Disease

## 2018-02-17 ENCOUNTER — Ambulatory Visit (INDEPENDENT_AMBULATORY_CARE_PROVIDER_SITE_OTHER): Payer: PPO | Admitting: Family Medicine

## 2018-02-17 ENCOUNTER — Encounter: Payer: Self-pay | Admitting: Family Medicine

## 2018-02-17 VITALS — BP 124/70 | HR 62 | Wt 229.6 lb

## 2018-02-17 DIAGNOSIS — Z79899 Other long term (current) drug therapy: Secondary | ICD-10-CM | POA: Diagnosis not present

## 2018-02-17 DIAGNOSIS — I10 Essential (primary) hypertension: Secondary | ICD-10-CM

## 2018-02-17 DIAGNOSIS — Z8739 Personal history of other diseases of the musculoskeletal system and connective tissue: Secondary | ICD-10-CM

## 2018-02-17 DIAGNOSIS — E890 Postprocedural hypothyroidism: Secondary | ICD-10-CM

## 2018-02-17 DIAGNOSIS — R319 Hematuria, unspecified: Secondary | ICD-10-CM | POA: Diagnosis not present

## 2018-02-17 DIAGNOSIS — R600 Localized edema: Secondary | ICD-10-CM

## 2018-02-17 DIAGNOSIS — R801 Persistent proteinuria, unspecified: Secondary | ICD-10-CM

## 2018-02-17 DIAGNOSIS — M1A09X0 Idiopathic chronic gout, multiple sites, without tophus (tophi): Secondary | ICD-10-CM

## 2018-02-17 LAB — POCT URINALYSIS DIP (PROADVANTAGE DEVICE)
BILIRUBIN UA: NEGATIVE
GLUCOSE UA: NEGATIVE mg/dL
Ketones, POC UA: NEGATIVE mg/dL
NITRITE UA: NEGATIVE
PH UA: 6 (ref 5.0–8.0)
Specific Gravity, Urine: 1.025
Urobilinogen, Ur: NEGATIVE

## 2018-02-17 NOTE — Patient Instructions (Addendum)
Your BP is under good control. BP today is 124/70.   Cut back on white foods (pasta, rice, potatoes, sugar) Do not drink your calories.  Start looking at the nutrition labels and cut back on carbohydrates and calories.    Call and schedule [facility].   Call and schedule your mammogram at [facility].   When you see your rheumatologist, please address the Uloric with them but it appears that you can reduce this dose.  Your uric acid was 1.3  We will call you with lab results.

## 2018-02-17 NOTE — Progress Notes (Signed)
   Subjective:    Patient ID: Claudia Cantrell, female    DOB: 1946/03/27, 72 y.o.   MRN: [id number]  HPI Chief Complaint  Patient presents with  . Follow-up    follow up 5 week up  , labs , ua    She is here to follow up on protein in her urine.  She would also like to have a repeat TSH.  States she has been taking Synthroid daily on an empty stomach.  She is also concerned about her weight. She has been eating pasta.  Her diet seems to be high in carbs and unhealthy snacks. States yesterday she ate pasta and ice cream sandwich.  She often skips breakfast.  LE edema chronic -this has improved slightly.   She has upcoming appointments with her rheumatologist for management of gout and her cardiologist for atrial fibrillation and hypertensive heart disease.  She is on Eliquis and no signs of bleeding. Recent uric acid level was low.    Denies fever, chills, dizziness, chest pain, palpitations, shortness of breath, abdominal pain, N/V/D, urinary symptoms.    I saw her on January 13, 2018 for Medicare wellness visit and recommend that she call and schedule a mammogram and with her gastroenterologist for repeat colonoscopy and she has not done either of these. States she plans to do these.  She did get a shingles vaccine.   No other concerns or complaints today.  Reviewed allergies, medications, past medical, surgical, family, and social history.   Review of Systems Pertinent positives and negatives in the history of present illness.     Objective:   Physical Exam BP 124/70   Pulse 62   Wt 229 lb 9.6 oz (104.1 kg)   SpO2 96%   BMI 38.21 kg/m   Alert and oriented an in no acute distress. LE with non pitting trace edema, R> L. Normal work of breathing.       Assessment & Plan:  Persistent proteinuria - Plan: POCT Urinalysis DIP (Proadvantage Device), Microalbumin / creatinine urine ratio, Protein electrophoresis, serum, Protein Electrophoresis, Urine Rflx., Urinalysis,  microscopic only  Essential hypertension, benign - Plan: Microalbumin / creatinine urine ratio  Postsurgical hypothyroidism - Plan: TSH  Medication management - Plan: TSH  History of juvenile rheumatoid arthritis  Idiopathic chronic gout of multiple sites without tophus  Hematuria, unspecified type - Plan: Urinalysis, microscopic only, CANCELED: POCT UA - Microscopic Only  Bilateral leg edema  HTN- BP is well controlled.  Counseled on weight loss by cutting out white foods, cutting out sugary drinks, and cutting back on carbohydrates and portion sizes.  Reviewed recent uric acid. She would like to cut back on her daily dose of Colcrys.  She will bring this up with her rheumatologist but I do believe that she could cut back from 80 mg to 40 mg.  I did not change this today. Recheck TSH per patient request.  Proteinuria for the past 2 visits. Will check SPEP and UPEP  Hematuria on UA dipstick: will send for microscopy.

## 2018-02-18 LAB — URINALYSIS, MICROSCOPIC ONLY: CASTS: NONE SEEN /LPF

## 2018-02-19 LAB — TSH: TSH: 1.64 u[IU]/mL (ref 0.450–4.500)

## 2018-02-19 LAB — PROTEIN ELECTROPHORESIS, URINE REFLEX
.: 0
ALBUMIN ELP UR: 28.7 %
Alpha-1-Globulin, U: 6.1 %
Alpha-2-Globulin, U: 15.6 %
Beta Globulin, U: 27.3 %
Gamma Globulin, U: 22.3 %
Protein, Ur: 124.4 mg/dL

## 2018-02-19 LAB — PROTEIN ELECTROPHORESIS, SERUM
A/G Ratio: 1.1 (ref 0.7–1.7)
ALPHA 2: 0.9 g/dL (ref 0.4–1.0)
Albumin ELP: 4 g/dL (ref 2.9–4.4)
Alpha 1: 0.2 g/dL (ref 0.0–0.4)
BETA: 1 g/dL (ref 0.7–1.3)
GAMMA GLOBULIN: 1.5 g/dL (ref 0.4–1.8)
GLOBULIN, TOTAL: 3.6 g/dL (ref 2.2–3.9)
Total Protein: 7.6 g/dL (ref 6.0–8.5)

## 2018-02-19 LAB — MICROALBUMIN / CREATININE URINE RATIO
Creatinine, Urine: 201.6 mg/dL
MICROALBUM., U, RANDOM: 238.5 ug/mL
Microalb/Creat Ratio: 118.3 mg/g creat — ABNORMAL HIGH (ref 0.0–30.0)

## 2018-02-19 LAB — IFE REFLEX, RANDOM URINE

## 2018-02-21 ENCOUNTER — Telehealth: Payer: Self-pay

## 2018-02-21 NOTE — Telephone Encounter (Signed)
Called, LVM advising patient to return phone call regarding lab work, left call back number.

## 2018-02-21 NOTE — Telephone Encounter (Signed)
-----   Message from Girtha Rm, NP-C sent at 02/20/2018  5:29 PM EDT ----- Her thyroid function is normal so she appears to be on the correct dose of Synthroid. No blood in her urine and the protein in her urine is not quite as much as her previous visit. Have her discuss the Uloric with her rheumatologist, this may be able to be reduced.  Let's have her come back to see me in 2-3 months for a follow up (her next diabetes visit is ok in 3 months).

## 2018-02-21 NOTE — Progress Notes (Signed)
Office Visit Note  Patient: Claudia Cantrell             Date of Birth: [birth year]           MRN: [id number]             PCP: Girtha Rm, NP-C Referring: No ref. provider found Visit Date: 03/07/2018 Occupation: @GUAROCC @    Subjective:  Muscle aches   History of Present Illness: Claudia Cantrell is a 72 y.o. female with history of gout.  She has not had any recent flares of her gout.  She states that her PCP checked a uric acid the beginning of February and her uric acid 1.3.  She was advised to discuss talking about decreasing her dose of Uloric.  She stopped taking Uloric 80 mg two weeks ago.  She takes colchicine 0.6 mg as needed.  She has not had any flares since discontinuing Uloric.  She states that she tries to avoid the trigger foods and beer. Patient reports she has been having increased muscle aches and muscle tenderness in multiple joints.  She says her trapezius muscles bilaterally been tender.  She is also having some right shoulder pain.  She denies any injuries.  She states that she occasionally uses a heating pad and takes Tylenol for pain relief.    Activities of Daily Living:  Patient reports morning stiffness for several hours.   Patient Reports nocturnal pain.  Difficulty dressing/grooming: Denies Difficulty climbing stairs: Reports Difficulty getting out of chair: Denies Difficulty using hands for taps, buttons, cutlery, and/or writing: Denies   Review of Systems  Constitutional: Positive for fatigue.  HENT: Negative for mouth sores, mouth dryness and nose dryness.   Eyes: Negative for pain, visual disturbance and dryness.  Respiratory: Negative for cough, hemoptysis, shortness of breath and difficulty breathing.   Cardiovascular: Positive for swelling in legs/feet. Negative for chest pain, palpitations and hypertension.  Gastrointestinal: Negative for abdominal pain, blood in stool, constipation and diarrhea.  Endocrine: Negative for increased urination.    Genitourinary: Negative for painful urination and pelvic pain.  Musculoskeletal: Positive for arthralgias, joint pain and morning stiffness. Negative for joint swelling, myalgias, muscle weakness, muscle tenderness and myalgias.  Skin: Negative for color change, pallor, rash, hair loss, nodules/bumps, skin tightness, ulcers and sensitivity to sunlight.  Allergic/Immunologic: Negative for susceptible to infections.  Neurological: Negative for dizziness, numbness, headaches, memory loss and weakness.  Hematological: Negative for bruising/bleeding tendency and swollen glands.  Psychiatric/Behavioral: Negative for depressed mood, confusion and sleep disturbance. The patient is not nervous/anxious.     PMFS History:  Patient Active Problem List   Diagnosis Date Noted  . Persistent proteinuria 02/17/2018  . 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 Vitamin D deficiency 03/26/2017  . Medication monitoring encounter 03/26/2017  . Gout 10/08/2016  . PAF (paroxysmal atrial fibrillation) ([place])   . Hypertensive heart disease   . Hyperlipidemia   . CAD (coronary artery disease)   . Nonischemic cardiomyopathy ([place])   . Chest pain with moderate risk for cardiac etiology 08/02/2016  . Atrial fibrillation ([place]) 07/29/2016  . Closed right ankle fracture 04/24/2015  . Ankle fracture, bimalleolar, closed 04/24/2015  . 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 CAD in native artery 10/09/2013  . OSA on CPAP 10/09/2013  . Essential hypertension, benign 05/27/2013    Past Medical History:  Diagnosis Date  . Arthritis    rheumatoid  . CAD (coronary artery disease)  a. 1992 s/p MI and PTCA of unknown vessel;  b. 09/2010 Cath: LM nl, LAD 20p, LCX 65m, RCA 78m, RPL 20.  . Cancer ([place])   . Family history of anesthesia complication    daughter has difficulty waking   . GERD (gastroesophageal reflux disease)    occ  . Gout 10/08/2016  . Hyperlipidemia   .  Hypertensive heart disease   . Nonischemic cardiomyopathy ([place])    a. 07/2016 Echo: EF 40-45%, mild LVH, inferior akinesis, moderately dilated left atrium, trivial AI and MR.  Marland Kitchen PAF (paroxysmal atrial fibrillation) ([place])    a. 07/2016 Admitted w/ AF RVR-->CHA2DS2VASc = 5-->Eliquis;  b. 07/2016 successful TEE/DCCV.  Marland Kitchen Sleep apnea    a. Using CPAP.    Family History  Problem Relation Age of Onset  . Heart disease Mother   . Sudden death Mother   . Diabetes Father   . Stroke Father   . Bone cancer Sister   . Sudden death Brother   . Melanoma Brother   . Heart disease Brother    Past Surgical History:  Procedure Laterality Date  . ABDOMINAL HYSTERECTOMY  1988  . BACK SURGERY  1994  . CARDIAC CATHETERIZATION  1991 AND 1992   PTCA BY DR Lamount Cohen  . CARDIOVERSION N/A 08/01/2016   Procedure: CARDIOVERSION;  Surgeon: Lelon Perla, MD;  Location[facility] ENDOSCOPY;  Service: Cardiovascular;  Laterality: N/A;  . ORIF ANKLE FRACTURE Right 04/27/2015   Procedure: OPEN REDUCTION INTERNAL FIXATION (ORIF) RIGHT BIMALLEOLAR ANKLE FRACTURE WITH SYNDESMOSIS FIXATION;  Surgeon: Leandrew Koyanagi, MD;  Location: [place];  Service: Orthopedics;  Laterality: Right;  . TEE WITHOUT CARDIOVERSION N/A 08/01/2016   Procedure: TRANSESOPHAGEAL ECHOCARDIOGRAM (TEE);  Surgeon: Lelon Perla, MD;  Location: [facility] ENDOSCOPY;  Service: Cardiovascular;  Laterality: N/A;  . THYROIDECTOMY  11/24/2013   DR Dalbert Batman  . THYROIDECTOMY N/A 11/24/2013   Procedure: TOTAL THYROIDECTOMY;  Surgeon: Adin Hector, MD;  Location: [place];  Service: General;  Laterality: N/A;  . TRANSTHORACIC ECHOCARDIOGRAM  01/15/2013   EF 55% TO 65%. PROBABLE MILD HYPOKINESIS OF THE INFERIOR MYOCARDIUM. GRADE 1 DIASTOLIC DYSFUNCTION. TRIAL AR.LA IS MILDLY DILATED.   Social History   Social History Narrative  . Not on file     Objective: Vital Signs: BP (!) 154/76 (BP Location: Left Arm, Patient Position: Sitting, Cuff Size: Normal)   Pulse  (!) 54   Resp 18   Ht 5\' 5"  (1.651 m)   Wt 230 lb (104.3 kg)   BMI 38.27 kg/m    Physical Exam  Constitutional: She is oriented to person, place, and time. She appears well-developed and well-nourished.  HENT:  Head: Normocephalic and atraumatic.  Eyes: Conjunctivae and EOM are normal.  Neck: Normal range of motion.  Cardiovascular: Normal rate, regular rhythm, normal heart sounds and intact distal pulses.  Pulmonary/Chest: Effort normal and breath sounds normal.  Abdominal: Soft. Bowel sounds are normal.  Lymphadenopathy:    She has no cervical adenopathy.  Neurological: She is alert and oriented to person, place, and time.  Skin: Skin is warm and dry. Capillary refill takes less than 2 seconds.  Psychiatric: She has a normal mood and affect. Her behavior is normal.  Nursing note and vitals reviewed.    Musculoskeletal Exam: C-spine, thoracic spine, lumbar spine good range of motion.  No midline spinal tenderness.  No SI joint tenderness.  She has some discomfort with right shoulder range of motion.  Left shoulder good range of  motion with no discomfort.  She has trapezius muscle tension muscle tenderness bilaterally.  Elbow joints, wrist joints, MCPs, PIPs, DIPs good range of motion with no synovitis.  Hip joints, knee joints, ankle joints, MTPs, PIPs and DIPs good range of motion with no synovitis.  No warmth or effusion of bilateral knees.  No trochanteric bursa tenderness.  Post-surgical incision on medial aspect of right ankle.  Warmth of right ankle noted.   CDAI Exam: No CDAI exam completed.    Investigation: No additional findings.Uric acid: 01/13/2018 1.3 CBC Latest Ref Rng & Units 08/27/2017 04/09/2017 10/09/2016  WBC 3.4 - 10.8 x10E3/uL 3.1(L) 4.5 3.5(L)  Hemoglobin 11.1 - 15.9 g/dL 15.6 14.7 15.0  Hematocrit 34.0 - 46.6 % 46.3 45.5(H) 45.8(H)  Platelets 150 - 379 x10E3/uL 268 290 248   CMP Latest Ref Rng & Units 02/17/2018 12/12/2017 09/20/2017  Glucose 65 - 99 mg/dL - 82  88  BUN 8 - 27 mg/dL - 15 23  Creatinine 0.57 - 1.00 mg/dL - 1.05(H) 1.48(H)  Sodium 134 - 144 mmol/L - 142 144  Potassium 3.5 - 5.2 mmol/L - 4.1 3.9  Chloride 96 - 106 mmol/L - 105 102  CO2 20 - 29 mmol/L - 22 21  Calcium 8.7 - 10.3 mg/dL - 9.5 9.8  Total Protein 6.0 - 8.5 g/dL 7.6 8.2 8.1  Total Bilirubin 0.0 - 1.2 mg/dL - 0.6 0.4  Alkaline Phos 39 - 117 IU/L - 128(H) 132(H)  AST 0 - 40 IU/L - 18 19  ALT 0 - 32 IU/L - 17 14    Imaging: No results found.  Speciality Comments: No specialty comments available.    Procedures:  No procedures performed Allergies: Codeine   Colchicine PRN Uric acid 2 mg  If flare take Colchicine 0.6 mg 1 tablet daily   Assessment / Plan:     Visit Diagnoses: Idiopathic chronic gout of multiple sites without tophus -Hx of tophus: She has not had any recent gout flares.  Her most recent uric acid on 01/23/2018 was 1.3.  She discontinued her Uloric 2 weeks ago.  She has not noticed any joint pain or joint swelling.  She has not been taking Colchicine.  We discussed the new  black box warning of Uloric.  Patient would like to switch to allopurinol 300 mg daily.  He was advised to take colchicine 0.6 mg.  We will recheck her uric acid in 2 months.  Future order for uric acid was placed today.  Prescriptions for Allopurinol 300 mg daily and Colchicine 0.6 mg PRN were sent to the pharmacy.  Medication monitoring encounter - Allopurinol 300 mg daily and colchicine 0.6 mg PRN.   Hyperuricemia: Her most recent uric acid level was checked on 01/23/2018 and it was 1.3.  We will recheck her uric acid in 2 months.  Trigger finger of left thumb: Resolved.  Chronic right shoulder pain: She has some discomfort with range of motion of her right shoulder.  She has been experiencing some pain at night.  She also has trapezius muscle tension and muscle tenderness bilaterally.  She declined a cortisone injection today.  She was given a handout of shoulder exercises that  she can perform at home.  History of vitamin D deficiency - She's on a supplement.  Other medical conditions are listed as follows:  History of obesity  OSA on CPAP  History of hyperlipidemia  History of coronary artery disease  History of hypothyroidism  CAD in native artery  History  of hypertension  History of atrial fibrillation  Closed fracture of right ankle, sequela: She has some warmth of her right ankle in a postsurgical incision on the medial aspect.   Orders: No orders of the defined types were placed in this encounter.  No orders of the defined types were placed in this encounter.   Face-to-face time spent with patient was 30  Minutes.> 50% of time was spent in counseling and coordination of care.  Follow-Up Instructions: Return in about 6 months (around 09/07/2018) for Gout.   Ofilia Neas, PA-C   I examined and evaluated the patient with Hazel Sams PA. The plan of care was discussed as noted above.  Bo Merino, MD  Note - This record has been created using Editor, commissioning.  Chart creation errors have been sought, but may not always  have been located. Such creation errors do not reflect on  the standard of medical care.

## 2018-03-07 ENCOUNTER — Ambulatory Visit (INDEPENDENT_AMBULATORY_CARE_PROVIDER_SITE_OTHER): Payer: PPO | Admitting: Rheumatology

## 2018-03-07 ENCOUNTER — Encounter: Payer: Self-pay | Admitting: Rheumatology

## 2018-03-07 VITALS — BP 154/76 | HR 54 | Resp 18 | Ht 65.0 in | Wt 230.0 lb

## 2018-03-07 DIAGNOSIS — E790 Hyperuricemia without signs of inflammatory arthritis and tophaceous disease: Secondary | ICD-10-CM

## 2018-03-07 DIAGNOSIS — I2510 Atherosclerotic heart disease of native coronary artery without angina pectoris: Secondary | ICD-10-CM

## 2018-03-07 DIAGNOSIS — G8929 Other chronic pain: Secondary | ICD-10-CM | POA: Diagnosis not present

## 2018-03-07 DIAGNOSIS — Z9989 Dependence on other enabling machines and devices: Secondary | ICD-10-CM

## 2018-03-07 DIAGNOSIS — M65312 Trigger thumb, left thumb: Secondary | ICD-10-CM

## 2018-03-07 DIAGNOSIS — G4733 Obstructive sleep apnea (adult) (pediatric): Secondary | ICD-10-CM | POA: Diagnosis not present

## 2018-03-07 DIAGNOSIS — M25511 Pain in right shoulder: Secondary | ICD-10-CM

## 2018-03-07 DIAGNOSIS — M1A09X0 Idiopathic chronic gout, multiple sites, without tophus (tophi): Secondary | ICD-10-CM | POA: Diagnosis not present

## 2018-03-07 DIAGNOSIS — Z8639 Personal history of other endocrine, nutritional and metabolic disease: Secondary | ICD-10-CM | POA: Diagnosis not present

## 2018-03-07 DIAGNOSIS — S82891S Other fracture of right lower leg, sequela: Secondary | ICD-10-CM

## 2018-03-07 DIAGNOSIS — Z8679 Personal history of other diseases of the circulatory system: Secondary | ICD-10-CM

## 2018-03-07 DIAGNOSIS — Z5181 Encounter for therapeutic drug level monitoring: Secondary | ICD-10-CM | POA: Diagnosis not present

## 2018-03-07 MED ORDER — COLCHICINE 0.6 MG PO TABS: 0.6000 mg | ORAL_TABLET | Freq: Every day | ORAL | 1 refills | Status: DC | PRN

## 2018-03-07 MED ORDER — ALLOPURINOL 300 MG PO TABS: 300.0000 mg | ORAL_TABLET | Freq: Every day | ORAL | 2 refills | Status: DC

## 2018-03-07 NOTE — Patient Instructions (Addendum)
Shoulder Exercises Ask your health care provider which exercises are safe for you. Do exercises exactly as told by your health care provider and adjust them as directed. It is normal to feel mild stretching, pulling, tightness, or discomfort as you do these exercises, but you should stop right away if you feel sudden pain or your pain gets worse.Do not begin these exercises until told by your health care provider. RANGE OF MOTION EXERCISES These exercises warm up your muscles and joints and improve the movement and flexibility of your shoulder. These exercises also help to relieve pain, numbness, and tingling. These exercises involve stretching your injured shoulder directly. Exercise A: Pendulum  1. Stand near a wall or a surface that you can hold onto for balance. 2. Bend at the waist and let your left / right arm hang straight down. Use your other arm to support you. Keep your back straight and do not lock your knees. 3. Relax your left / right arm and shoulder muscles, and move your hips and your trunk so your left / right arm swings freely. Your arm should swing because of the motion of your body, not because you are using your arm or shoulder muscles. 4. Keep moving your body so your arm swings in the following directions, as told by your health care provider: ? Side to side. ? Forward and backward. ? In clockwise and counterclockwise circles. 5. Continue each motion for __________ seconds, or for as long as told by your health care provider. 6. Slowly return to the starting position. Repeat __________ times. Complete this exercise __________ times a day. Exercise B:Flexion, Standing  1. Stand and hold a broomstick, a cane, or a similar object. Place your hands a little more than shoulder-width apart on the object. Your left / right hand should be palm-up, and your other hand should be palm-down. 2. Keep your elbow straight and keep your shoulder muscles relaxed. Push the stick down with  your healthy arm to raise your left / right arm in front of your body, and then over your head until you feel a stretch in your shoulder. ? Avoid shrugging your shoulder while you raise your arm. Keep your shoulder blade tucked down toward the middle of your back. 3. Hold for __________ seconds. 4. Slowly return to the starting position. Repeat __________ times. Complete this exercise __________ times a day. Exercise C: Abduction, Standing 1. Stand and hold a broomstick, a cane, or a similar object. Place your hands a little more than shoulder-width apart on the object. Your left / right hand should be palm-up, and your other hand should be palm-down. 2. While keeping your elbow straight and your shoulder muscles relaxed, push the stick across your body toward your left / right side. Raise your left / right arm to the side of your body and then over your head until you feel a stretch in your shoulder. ? Do not raise your arm above shoulder height, unless your health care provider tells you to do that. ? Avoid shrugging your shoulder while you raise your arm. Keep your shoulder blade tucked down toward the middle of your back. 3. Hold for __________ seconds. 4. Slowly return to the starting position. Repeat __________ times. Complete this exercise __________ times a day. Exercise D:Internal Rotation  1. Place your left / right hand behind your back, palm-up. 2. Use your other hand to dangle an exercise band, a towel, or a similar object over your shoulder. Grasp the band with   your left / right hand so you are holding onto both ends. 3. Gently pull up on the band until you feel a stretch in the front of your left / right shoulder. ? Avoid shrugging your shoulder while you raise your arm. Keep your shoulder blade tucked down toward the middle of your back. 4. Hold for __________ seconds. 5. Release the stretch by letting go of the band and lowering your hands. Repeat __________ times. Complete  this exercise __________ times a day. STRETCHING EXERCISES These exercises warm up your muscles and joints and improve the movement and flexibility of your shoulder. These exercises also help to relieve pain, numbness, and tingling. These exercises are done using your healthy shoulder to help stretch the muscles of your injured shoulder. Exercise E: Corner Stretch (External Rotation and Abduction)  1. Stand in a doorway with one of your feet slightly in front of the other. This is called a staggered stance. If you cannot reach your forearms to the door frame, stand facing a corner of a room. 2. Choose one of the following positions as told by your health care provider: ? Place your hands and forearms on the door frame above your head. ? Place your hands and forearms on the door frame at the height of your head. ? Place your hands on the door frame at the height of your elbows. 3. Slowly move your weight onto your front foot until you feel a stretch across your chest and in the front of your shoulders. Keep your head and chest upright and keep your abdominal muscles tight. 4. Hold for __________ seconds. 5. To release the stretch, shift your weight to your back foot. Repeat __________ times. Complete this stretch __________ times a day. Exercise F:Extension, Standing 1. Stand and hold a broomstick, a cane, or a similar object behind your back. ? Your hands should be a little wider than shoulder-width apart. ? Your palms should face away from your back. 2. Keeping your elbows straight and keeping your shoulder muscles relaxed, move the stick away from your body until you feel a stretch in your shoulder. ? Avoid shrugging your shoulders while you move the stick. Keep your shoulder blade tucked down toward the middle of your back. 3. Hold for __________ seconds. 4. Slowly return to the starting position. Repeat __________ times. Complete this exercise __________ times a day. STRENGTHENING  EXERCISES These exercises build strength and endurance in your shoulder. Endurance is the ability to use your muscles for a long time, even after they get tired. Exercise G:External Rotation  1. Sit in a stable chair without armrests. 2. Secure an exercise band at elbow height on your left / right side. 3. Place a soft object, such as a folded towel or a small pillow, between your left / right upper arm and your body to move your elbow a few inches away (about 10 cm) from your side. 4. Hold the end of the band so it is tight and there is no slack. 5. Keeping your elbow pressed against the soft object, move your left / right forearm out, away from your abdomen. Keep your body steady so only your forearm moves. 6. Hold for __________ seconds. 7. Slowly return to the starting position. Repeat __________ times. Complete this exercise __________ times a day. Exercise H:Shoulder Abduction  1. Sit in a stable chair without armrests, or stand. 2. Hold a __________ weight in your left / right hand, or hold an exercise band with both hands.   3. Start with your arms straight down and your left / right palm facing in, toward your body. 4. Slowly lift your left / right hand out to your side. Do not lift your hand above shoulder height unless your health care provider tells you that this is safe. ? Keep your arms straight. ? Avoid shrugging your shoulder while you do this movement. Keep your shoulder blade tucked down toward the middle of your back. 5. Hold for __________ seconds. 6. Slowly lower your arm, and return to the starting position. Repeat __________ times. Complete this exercise __________ times a day. Exercise I:Shoulder Extension 1. Sit in a stable chair without armrests, or stand. 2. Secure an exercise band to a stable object in front of you where it is at shoulder height. 3. Hold one end of the exercise band in each hand. Your palms should face each other. 4. Straighten your elbows and  lift your hands up to shoulder height. 5. Step back, away from the secured end of the exercise band, until the band is tight and there is no slack. 6. Squeeze your shoulder blades together as you pull your hands down to the sides of your thighs. Stop when your hands are straight down by your sides. Do not let your hands go behind your body. 7. Hold for __________ seconds. 8. Slowly return to the starting position. Repeat __________ times. Complete this exercise __________ times a day. Exercise J:Standing Shoulder Row 1. Sit in a stable chair without armrests, or stand. 2. Secure an exercise band to a stable object in front of you so it is at waist height. 3. Hold one end of the exercise band in each hand. Your palms should be in a thumbs-up position. 4. Bend each of your elbows to an "L" shape (about 90 degrees) and keep your upper arms at your sides. 5. Step back until the band is tight and there is no slack. 6. Slowly pull your elbows back behind you. 7. Hold for __________ seconds. 8. Slowly return to the starting position. Repeat __________ times. Complete this exercise __________ times a day. Exercise K:Shoulder Press-Ups  1. Sit in a stable chair that has armrests. Sit upright, with your feet flat on the floor. 2. Put your hands on the armrests so your elbows are bent and your fingers are pointing forward. Your hands should be about even with the sides of your body. 3. Push down on the armrests and use your arms to lift yourself off of the chair. Straighten your elbows and lift yourself up as much as you comfortably can. ? Move your shoulder blades down, and avoid letting your shoulders move up toward your ears. ? Keep your feet on the ground. As you get stronger, your feet should support less of your body weight as you lift yourself up. 4. Hold for __________ seconds. 5. Slowly lower yourself back into the chair. Repeat __________ times. Complete this exercise __________ times a  day. Exercise L: Wall Push-Ups  1. Stand so you are facing a stable wall. Your feet should be about one arm-length away from the wall. 2. Lean forward and place your palms on the wall at shoulder height. 3. Keep your feet flat on the floor as you bend your elbows and lean forward toward the wall. 4. Hold for __________ seconds. 5. Straighten your elbows to push yourself back to the starting position. Repeat __________ times. Complete this exercise __________ times a day. This information is not intended to replace advice   given to you by your health care provider. Make sure you discuss any questions you have with your health care provider. Document Released: 10/10/2005 Document Revised: 08/20/2016 Document Reviewed: 08/07/2015 Elsevier Interactive Patient Education  2018 [place] We placed an order today for your standing lab work.    Please come back and get your standing labs in 2 months to check URIC ACID  We have open lab Monday through Friday from 8:30-11:30 AM and 1:30-4:00 PM  at the office of Dr. Bo Merino.   You may experience shorter wait times on Monday and Friday afternoons. The office is located at [address] [place], [place], [place] [address] No appointment is necessary.   Labs are drawn by [facility].  You may receive a bill from [facility] for your lab work. If you have any questions regarding directions or hours of operation,  please call [phone].

## 2018-03-11 ENCOUNTER — Other Ambulatory Visit: Payer: Self-pay | Admitting: Family Medicine

## 2018-03-17 ENCOUNTER — Ambulatory Visit (INDEPENDENT_AMBULATORY_CARE_PROVIDER_SITE_OTHER): Payer: PPO | Admitting: Cardiovascular Disease

## 2018-03-17 ENCOUNTER — Encounter: Payer: Self-pay | Admitting: Cardiovascular Disease

## 2018-03-17 VITALS — BP 164/84 | HR 53 | Ht 65.0 in | Wt 228.0 lb

## 2018-03-17 DIAGNOSIS — E039 Hypothyroidism, unspecified: Secondary | ICD-10-CM

## 2018-03-17 DIAGNOSIS — I480 Paroxysmal atrial fibrillation: Secondary | ICD-10-CM | POA: Diagnosis not present

## 2018-03-17 DIAGNOSIS — I2510 Atherosclerotic heart disease of native coronary artery without angina pectoris: Secondary | ICD-10-CM | POA: Diagnosis not present

## 2018-03-17 DIAGNOSIS — Z79899 Other long term (current) drug therapy: Secondary | ICD-10-CM

## 2018-03-17 DIAGNOSIS — Z6837 Body mass index (BMI) 37.0-37.9, adult: Secondary | ICD-10-CM

## 2018-03-17 DIAGNOSIS — G4733 Obstructive sleep apnea (adult) (pediatric): Secondary | ICD-10-CM | POA: Diagnosis not present

## 2018-03-17 DIAGNOSIS — I10 Essential (primary) hypertension: Secondary | ICD-10-CM | POA: Diagnosis not present

## 2018-03-17 MED ORDER — OLMESARTAN MEDOXOMIL 20 MG PO TABS: 20.0000 mg | ORAL_TABLET | Freq: Every day | ORAL | 3 refills | Status: DC

## 2018-03-17 NOTE — Patient Instructions (Signed)
Medication Instructions:  START olmesartan 20 mg daily  Labwork: Please return for labs in 1-2 weeks (BMET)  Our in office lab hours are Monday-Friday 8:00-4:00, closed for lunch 12:45-1:45 pm.  No appointment needed.  Follow-Up: 3 months with Dr. Claiborne Billings  Any Other Special Instructions Will Be Listed Below (If Applicable).     If you need a refill on your cardiac medications before your next appointment, please call your pharmacy.

## 2018-03-17 NOTE — Progress Notes (Signed)
Patient ID: Claudia Cantrell, female   DOB: 05-13-1946, 72 y.o.   MRN: [id number]     HPI: Claudia Cantrell is a 72 y.o. female who presents to the office today for a 3 month follow-up evaluation.  Claudia Cantrell suffered remote myocardial infarctions in 1991 and 1992 and at that time underwent PTCA by Dr. Lamount Cohen.  She has a history of hypertension, hyperlipidemia, obstructive sleep apnea on CPAP therapy, obesity, and hypothyroidism with multinodular goiter. In October 2011 she underwent cardiac catheterization after she had experienced episodes of chest pain. Catheterization showed preserved LV contractility although the was a very small focal area of severe hypocontractility involving the mid inferior wall as well as low posterior lateral wall in this patient status post remote myocardial infarction approximately 20 years ago treated with PTCA. At the time of her catheterization she did not have significant carotid obstructive disease and only had mild 10-20% narrowing at the ostium of the LAD, 20% irregularity in the mid AV groove circumflex, and 20% mid RCA and 20% posterior lateral irregularities. Subsequently, she has done well from a cardiovascular standpoint.  She is a history of a multinodular goiter and in 2014, underwent thyroidectomy and tolerated this well from a cardiovascular standpoint.  Since I saw her, she broke her leg in 2016.  She has noticed some increasing shortness of breath particularly when she eats chocolate.  She has been followed by Dr. Jeanann Lewandowsky, for primary care.  She developed atrial fibrillation this year and was admitted August 2017 with AF with rapid ventricular response.  With a cha2ds2vasc score of 5.  She was started on eliquis..  She underwent successful TEE guided cardioversion with restoration of sinus rhythm.  She saw [name] on 08/08/2016 for hospital follow-up and was doing well.  An echo Doppler study in the setting of AF showed an ejection fraction of  40-45%.  She underwent a nuclear perfusion study on August 30 117, which was low risk and suggested the possibility of a small basal to mid inferolateral defect.  There was no ischemia.  She has obstructive sleep apnea and has been on CPAP therapy since 2011.  When I saw her in October 2017, her CPAP machine was broken.  She was 100% compliant and on her prior sleep study.  She had been utilizing CPAP at 9 cm water pressure.    She received a new Medical illustrator  Sense 10 AutoSet for her CPAP unit from [employer] care as a DME company.  Two-month of compliance data were reviewed.  11/06/2016 through 12/05/2016 usage stays was 97% and usage greater than 4 hours was 97%.  She was averaging 7 hours and 9 minutes.  AHI was excellent at 0.9 at her 9 cm water pressure.  There was no leak.  She had had issues with intermittent function over new machine, which was under warranty.  This has been followed up with her DME company.   She was seen by [name] in September, October, and November 2018.  She has had issues with lower extremity edema and some chest congestion.  She had been using NyQuil.  Her levothyroxine dose was increased. .  She has been taking amlodipine 5 mg, Toprol-XL 50 mg for hypertension.  Levothyroxine 112 g for hypothyroidism.  She is on eliquis and denies any recent awareness of abnormal heart rhythm and is unaware of any recurrent atrial fibrillation.  She is on rosuvastatin 40 mg for hyperlipidemia.  When I saw her in  January 2019 a download was obtained from 11/16/2017 through 12/15/2017.  This showed 93% of usage stays with average use is at 7 hours and 26 minutes.  She is at 9 cm water pressure.  AHI is excellent at 0.7.  There is some occasional leaking her mask.  During that visit, her blood pressure was elevated and I suggested a slight titration of amlodipine from 5 mg up to 7.5 mg daily and that she continue her current dose of Toprol-XL 50 mg.  Over the past 3 months, she has felt well.  She  denies palpitations.  She denies chest pain.  She admits to using her CPAP.  She presents for reevaluation.  Past Medical History:  Diagnosis Date  . Arthritis    rheumatoid  . CAD (coronary artery disease)    a. 1992 s/p MI and PTCA of unknown vessel;  b. 09/2010 Cath: LM nl, LAD 20p, LCX 56m RCA 175mRPL 20.  . [facility] ([facility]  . Family history of anesthesia complication    daughter has difficulty waking   . GERD (gastroesophageal reflux disease)    occ  . Gout 10/08/2016  . Hyperlipidemia   . Hypertensive heart disease   . Nonischemic cardiomyopathy ([facility]   a. 07/2016 Echo: EF 40-45%, mild LVH, inferior akinesis, moderately dilated left atrium, trivial AI and MR.  . Marland KitchenAF (paroxysmal atrial fibrillation) ([place]   a. 07/2016 Admitted w/ AF RVR-->CHA2DS2VASc = 5-->Eliquis;  b. 07/2016 successful TEE/DCCV.  . Marland Kitchenleep apnea    a. Using CPAP.    Past Surgical History:  Procedure Laterality Date  . ABDOMINAL HYSTERECTOMY  1988  . BACK SURGERY  1994  . CARDIAC CATHETERIZATION  1991 AND 1992   PTCA BY DR CHLamount Cohen. CARDIOVERSION N/A 08/01/2016   Procedure: CARDIOVERSION;  Surgeon: BrLelon PerlaMD;  [facility];  Service: Cardiovascular;  Laterality: N/A;  . ORIF ANKLE FRACTURE Right 04/27/2015   Procedure: OPEN REDUCTION INTERNAL FIXATION (ORIF) RIGHT BIMALLEOLAR ANKLE FRACTURE WITH SYNDESMOSIS FIXATION;  Surgeon: NaLeandrew KoyanagiMD;  Location: [facility] Service: Orthopedics;  Laterality: Right;  . TEE WITHOUT CARDIOVERSION N/A 08/01/2016   Procedure: TRANSESOPHAGEAL ECHOCARDIOGRAM (TEE);  Surgeon: BrLelon PerlaMD;  [facility];  Service: Cardiovascular;  Laterality: N/A;  . THYROIDECTOMY  11/24/2013   DR INDalbert Batman. THYROIDECTOMY N/A 11/24/2013   Procedure: TOTAL THYROIDECTOMY;  Surgeon: HaAdin HectorMD;  [facility] Service: General;  Laterality: N/A;  . TRANSTHORACIC ECHOCARDIOGRAM  01/15/2013   EF 55% TO 65%. PROBABLE MILD HYPOKINESIS OF THE  INFERIOR MYOCARDIUM. GRADE 1 DIASTOLIC DYSFUNCTION. TRIAL AR.LA IS MILDLY DILATED.    Allergies  Allergen Reactions  . Codeine Anxiety    Current Outpatient Medications  Medication Sig Dispense Refill  . Acetaminophen (TYLENOL EXTRA STRENGTH PO) Take 2 tablets by mouth as needed.    . Marland Kitchenllopurinol (ZYLOPRIM) 300 MG tablet Take 1 tablet (300 mg total) by mouth daily. 30 tablet 2  . amLODipine (NORVASC) 5 MG tablet Take 1.5 tablets (7.5 mg total) by mouth daily. 135 tablet 3  . Biotin 10000 MCG TABS Take 1 tablet by mouth daily.    . Cholecalciferol (VITAMIN D-3 PO) Take 2,000 Units by mouth daily.     . Marland Kitchenod Liver Oil 1000 MG CAPS Take 1 capsule by mouth daily.    . colchicine 0.6 MG tablet Take 1 tablet (0.6 mg total) by mouth daily as needed. 60 tablet 1  .  ELIQUIS 5 MG TABS tablet TAKE 1 TABLET BY MOUTH TWICE DAILY 180 tablet 1  . Glucosamine-Chondroit-Vit C-Mn (GLUCOSAMINE CHONDR 1500 COMPLX) CAPS Take 1 capsule daily by mouth.    . metoprolol succinate (TOPROL-XL) 50 MG 24 hr tablet Take 1 tablet (50 mg total) by mouth daily. Take with or immediately following a meal. 30 tablet 1  . Omega 3 1200 MG CAPS Take by mouth daily.    . Probiotic Product (PROBIOTIC-10) CAPS Take 1 capsule by mouth daily.    . rosuvastatin (CRESTOR) 40 MG tablet Take 1 tablet (40 mg total) by mouth daily. 90 tablet 3  . SYNTHROID 112 MCG tablet TAKE 1 TABLET BY MOUTH ONCE DAILY 30 tablet 2  . olmesartan (BENICAR) 20 MG tablet Take 1 tablet (20 mg total) by mouth daily. 90 tablet 3   No current facility-administered medications for this visit.     Social History   Socioeconomic History  . Marital status: Widowed    Spouse name: Not on file  . Number of children: Not on file  . Years of education: Not on file  . Highest education level: Not on file  Occupational History  . Not on file  Social Needs  . Financial resource strain: Not on file  . Food insecurity:    Worry: Not on file    Inability: Not  on file  . Transportation needs:    Medical: Not on file    Non-medical: Not on file  Tobacco Use  . Smoking status: Former Smoker    Packs/day: 1.00    Years: 15.00    Pack years: 15.00    Types: Cigarettes    Last attempt to quit: 12/10/1990    Years since quitting: 27.2  . Smokeless tobacco: Never Used  . Tobacco comment: occ alcohol  Substance and Sexual Activity  . Alcohol use: Yes    Comment: very rare  . Drug use: No  . Sexual activity: Not on file  Lifestyle  . Physical activity:    Days per week: Not on file    Minutes per session: Not on file  . Stress: Not on file  Relationships  . Social connections:    Talks on phone: Not on file    Gets together: Not on file    Attends religious service: Not on file    Active member of club or organization: Not on file    Attends meetings of clubs or organizations: Not on file    Relationship status: Not on file  . Intimate partner violence:    Fear of current or ex partner: Not on file    Emotionally abused: Not on file    Physically abused: Not on file    Forced sexual activity: Not on file  Other Topics Concern  . Not on file  Social History Narrative  . Not on file    Family History  Problem Relation Age of Onset  . Heart disease Mother   . Sudden death Mother   . Diabetes Father   . Stroke Father   . Bone cancer Sister   . Sudden death Brother   . Melanoma Brother   . Heart disease Brother    Social history is notable that she is widowed and has 2 children plus one adopted child as well as 2 grandchildren. She does not routinely exercise. There is no tobacco or alcohol use.   ROS General: Negative; No fevers, chills, or night sweats;  HEENT: Negative; No changes  in vision or hearing, sinus congestion, difficulty swallowing Pulmonary: Negative; No cough, wheezing, shortness of breath, hemoptysis Cardiovascular: No recent chest pain, dyspnea. GI: Negative; No nausea, vomiting, diarrhea, or abdominal  pain GU: Negative; No dysuria, hematuria, or difficulty voiding Musculoskeletal: Negative; no myalgias, joint pain, or weakness Hematologic/Oncology: Negative; no easy bruising, bleeding Endocrine: History of multinodular goiter status post thyroidectomy Neuro: Negative; no changes in balance, headaches Skin: Negative; No rashes or skin lesions Psychiatric: Negative; No behavioral problems, depression Sleep: Positive obstructive sleep apnea.  She admits to 100% compliance.  Her machine is broken.Other comprehensive 14 point system review is negative.   PE BP (!) 164/84   Pulse (!) 53   Ht 5' 5" (1.651 m)   Wt 228 lb (103.4 kg)   BMI 37.94 kg/m    Repeat blood pressure by me was 166/84  Wt Readings from Last 3 Encounters:  03/17/18 228 lb (103.4 kg)  03/07/18 230 lb (104.3 kg)  02/17/18 229 lb 9.6 oz (104.1 kg)   General: Alert, oriented, no distress.  Skin: normal turgor, no rashes, warm and dry HEENT: Normocephalic, atraumatic. Pupils equal round and reactive to light; sclera anicteric; extraocular muscles intact;  Nose without nasal septal hypertrophy Mouth/Parynx benign; Mallinpatti scale 3 Neck: No JVD, no carotid bruits; normal carotid upstroke Lungs: clear to ausculatation and percussion; no wheezing or rales Chest wall: without tenderness to palpitation Heart: PMI not displaced, RRR, s1 s2 normal, 1/6 systolic murmur, no diastolic murmur, no rubs, gallops, thrills, or heaves Abdomen: soft, nontender; no hepatosplenomehaly, BS+; abdominal aorta nontender and not dilated by palpation. Back: no CVA tenderness Pulses 2+ Musculoskeletal: full range of motion, normal strength, no joint deformities Extremities: no clubbing cyanosis or edema, Homan's sign negative  Neurologic: grossly nonfocal; Cranial nerves grossly wnl Psychologic: Normal mood and affect  ECG (independently read by me): Sinus bradycardia 53 bpm.  Nonspecific T changes.  Normal intervals.  December 16, 2017  ECG (independently read by me): Sinus bradycardia 57 bpm.  Q wave in lead 3.  Poor R wave progression V1 through V3.  October  2017 ECG (independently read by me): Sinus bradycardia 52 bpm.  Poor anterior R-wave progression.  Normal intervals.  October 2014 ECG: Sinus rhythm at 69 beats per minute. Normal intervals. Nonspecific T changes.  LABS:  BMP Latest Ref Rng & Units 12/12/2017 09/20/2017 09/11/2017  Glucose 65 - 99 mg/dL 82 88 127(H)  BUN 8 - 27 mg/dL _0 Creatinine 0.57 - 1.00 mg/dL 1.05(H) 1.48(H) 0.75  BUN/Creat Ratio 12 - _1 Sodium 134 - 144 mmol/L 142 144 140  Potassium 3.5 - 5.2 mmol/L 4.1 3.9 3.2(L)  Chloride 96 - 106 mmol/L 105 102 98  CO2 20 - 29 mmol/L _2 Calcium 8.7 - 10.3 mg/dL 9.5 9.8 9.6   Hepatic Function Latest Ref Rng & Units 02/17/2018 12/12/2017 09/20/2017  Total Protein 6.0 - 8.5 g/dL 7.6 8.2 8.1  Albumin 3.6 - 4.8 g/dL - 4.5 4.5  AST 0 - 40 IU/L - 18 19  ALT 0 - 32 IU/L - 17 14  Alk Phosphatase 39 - 117 IU/L - 128(H) 132(H)  Total Bilirubin 0.0 - 1.2 mg/dL - 0.6 0.4  Bilirubin, Direct 0.00 - 0.40 mg/dL - - 0.12   CBC Latest Ref Rng & Units 08/27/2017 04/09/2017 10/09/2016  WBC 3.4 - 10.8 x10E3/uL 3.1(L) 4.5 3.5(L)  Hemoglobin 11.1 - 15.9 g/dL 15.6 14.7 15.0  Hematocrit 34.0 - 46.6 %  46.3 45.5(H) 45.8(H)  Platelets 150 - 379 x10E3/uL 268 290 248   Lab Results  Component Value Date   MCV 87 08/27/2017   MCV 88.9 04/09/2017   MCV 89.8 10/09/2016   Lab Results  Component Value Date   TSH 1.640 02/17/2018    Lipid Panel     Component Value Date/Time   CHOL 169 12/12/2017 1141   TRIG 161 (H) 12/12/2017 1141   HDL 42 12/12/2017 1141   CHOLHDL 4.0 12/12/2017 1141   CHOLHDL 4.5 07/30/2016 0550   VLDL 26 07/30/2016 0550   LDLCALC 95 12/12/2017 1141   IMPRESSION:  1. CAD in native artery   2. PAF (paroxysmal atrial fibrillation) ([place])   3. Essential hypertension   4. Medication management   5. Class 2 severe obesity due to  excess calories with serious comorbidity and body mass index (BMI) of 37.0 to 37.9 in adult ([place])   6. OSA (obstructive sleep apnea)   7. Hypothyroidism, unspecified type      ASSESSMENT AND PLAN: Ms. Italia Wolfert is 72 year-old [place] American female who suffered a remote myocardial infarction  and underwent PTCA by Dr. Sherald Barge 27 years ago.  Cardiac catheterization in 2011 demonstrated a small focal wall motion abnormality in the inferior and posterior lateral wall concordant with his myocardial infarction. She has mild nonobstructive CAD. Her last  nuclear perfusion study shows an ejection fraction of 55% and findings consistent with a small area of scar in the basal and mid inferolateral wall concordant with her left circumflex infarct.  She developed atrial fibrillation in August 2017 and underwent successful TEE cardioversion.  Her ECG today shows sinus rhythm with bradycardia 53 bpm.  She is on eliquis for anticoagulation and denies bleeding.  Her blood pressure today is elevated despite taking amlodipine 7.5 mg daily and Toprol-XL 50 mg.  I am adding on losartan 20 mg for more optimal blood pressure control with target blood pressure less than 130/80.  She continues to use her CPAP with 100% compliance.  She is on rosuvastatin 40 mg for hyperlipidemia with LDL less than 70.  She has hypothyroidism and remains on levothyroxine at 112 g.  TSH on 02/17/2018 was 1.64.  BMI is consistent with moderate obesity at 37.94.  I have strongly recommended weight loss as well as exercise at least 5 days per week for 30 minutes per current guideline directed recommendations.  I will check a be met in several weeks with initiation of on losartan.  I will see her in 3 months for reevaluation.  Time spent: 25 minutes  Troy Sine, MD, [facility]  03/19/2018 8:28 PM

## 2018-03-19 ENCOUNTER — Encounter: Payer: Self-pay | Admitting: Cardiovascular Disease

## 2018-04-02 DIAGNOSIS — I10 Essential (primary) hypertension: Secondary | ICD-10-CM | POA: Diagnosis not present

## 2018-04-02 DIAGNOSIS — Z79899 Other long term (current) drug therapy: Secondary | ICD-10-CM | POA: Diagnosis not present

## 2018-04-03 LAB — BASIC METABOLIC PANEL
BUN / CREAT RATIO: 14 (ref 12–28)
BUN: 16 mg/dL (ref 8–27)
CHLORIDE: 107 mmol/L — AB (ref 96–106)
CO2: 23 mmol/L (ref 20–29)
Calcium: 10 mg/dL (ref 8.7–10.3)
Creatinine, Ser: 1.14 mg/dL — ABNORMAL HIGH (ref 0.57–1.00)
GFR calc non Af Amer: 49 mL/min/{1.73_m2} — ABNORMAL LOW (ref >59)
GFR, EST AFRICAN AMERICAN: 57 mL/min/{1.73_m2} — AB (ref >59)
GLUCOSE: 90 mg/dL (ref 65–99)
POTASSIUM: 4.1 mmol/L (ref 3.5–5.2)
SODIUM: 145 mmol/L — AB (ref 134–144)

## 2018-04-08 ENCOUNTER — Encounter: Payer: Self-pay | Admitting: *Deleted

## 2018-04-28 ENCOUNTER — Ambulatory Visit: Payer: Self-pay | Admitting: Family Medicine

## 2018-05-01 ENCOUNTER — Ambulatory Visit (INDEPENDENT_AMBULATORY_CARE_PROVIDER_SITE_OTHER): Payer: PPO | Admitting: Family Medicine

## 2018-05-01 ENCOUNTER — Encounter: Payer: Self-pay | Admitting: Family Medicine

## 2018-05-01 VITALS — BP 120/72 | HR 62 | Wt 219.8 lb

## 2018-05-01 DIAGNOSIS — R7303 Prediabetes: Secondary | ICD-10-CM

## 2018-05-01 DIAGNOSIS — E669 Obesity, unspecified: Secondary | ICD-10-CM | POA: Diagnosis not present

## 2018-05-01 DIAGNOSIS — I10 Essential (primary) hypertension: Secondary | ICD-10-CM | POA: Diagnosis not present

## 2018-05-01 LAB — POCT GLYCOSYLATED HEMOGLOBIN (HGB A1C): HEMOGLOBIN A1C: 5.6 % (ref 4.0–5.6)

## 2018-05-01 NOTE — Progress Notes (Signed)
   Subjective:    Patient ID: Claudia Cantrell, female    DOB: 01-18-1946, 72 y.o.   MRN: [id number]  HPI Chief Complaint  Patient presents with  . follow-up    follow-up   She is here to follow up on weight, HTN, and prediabetes. She has lost and she feels much better.  States her clothes are fitting her better.  She has made healthy lifestyle changes. Cut out soda. Cut back on bread, sweets. Eating more baked foods. Started walking.  Cut back on sodium.  States she is no longer craving sugar and carbs.  HTN- BP at home is in a good range.  Reports good medication compliance and no side effects.  Prediabetes- Hgb A1c 5.6% today   Has not scheduled her mammogram and colonoscopy. She plans to do this.   No new concerns or complaints today.  Denies fever, chills, chest pain,  Shortness of breath, abdominal pain, nausea, vomiting, diarrhea.  Reviewed allergies, medications, past medical, surgical, family, and social history.    Review of Systems Pertinent positives and negatives in the history of present illness.     Objective:   Physical Exam BP 120/72   Pulse 62   Wt 219 lb 12.8 oz (99.7 kg)   BMI 36.58 kg/m  Alert and oriented and in no acute distress.  Not otherwise examined.     Assessment & Plan:  Prediabetes - Plan: HgB A1c  Essential hypertension, benign  Obesity (BMI 30-39.9)  Commended her on making healthy lifestyle changes and weight loss.  Encouraged her to continue making good food choices and keep up her physical activity.  She seems quite motivated. Blood pressure is in goal range she will continue on current medication regimen. Hemoglobin A1c is in normal range today at 5.6% and has improved from 6.0% She we will follow-up with her cardiologist as scheduled in July. She requests to return here in 6 to 8 weeks for a weight check and to hold herself accountable.  I am fine with this.

## 2018-05-01 NOTE — Patient Instructions (Addendum)
Call and schedule with [facility] [address] [place], [place]   Call and schedule with [facility] [address] [place], [place]   Congratulations on your healthy lifestyle changes. Keep up the excellent work!   Your blood pressure today is normal at 120/72 Your hemoglobin A1c is 5.6% and back in normal range.

## 2018-06-10 ENCOUNTER — Other Ambulatory Visit: Payer: Self-pay | Admitting: Family Medicine

## 2018-06-18 ENCOUNTER — Ambulatory Visit: Payer: PPO | Admitting: Family Medicine

## 2018-06-27 ENCOUNTER — Encounter: Payer: Self-pay | Admitting: Cardiovascular Disease

## 2018-06-27 ENCOUNTER — Ambulatory Visit (INDEPENDENT_AMBULATORY_CARE_PROVIDER_SITE_OTHER): Payer: PPO | Admitting: Cardiovascular Disease

## 2018-06-27 VITALS — BP 122/86 | HR 57 | Ht 65.0 in | Wt 216.0 lb

## 2018-06-27 DIAGNOSIS — E785 Hyperlipidemia, unspecified: Secondary | ICD-10-CM | POA: Diagnosis not present

## 2018-06-27 DIAGNOSIS — I2510 Atherosclerotic heart disease of native coronary artery without angina pectoris: Secondary | ICD-10-CM | POA: Diagnosis not present

## 2018-06-27 DIAGNOSIS — Z6835 Body mass index (BMI) 35.0-35.9, adult: Secondary | ICD-10-CM | POA: Diagnosis not present

## 2018-06-27 DIAGNOSIS — G4733 Obstructive sleep apnea (adult) (pediatric): Secondary | ICD-10-CM

## 2018-06-27 DIAGNOSIS — I10 Essential (primary) hypertension: Secondary | ICD-10-CM

## 2018-06-27 DIAGNOSIS — I480 Paroxysmal atrial fibrillation: Secondary | ICD-10-CM | POA: Diagnosis not present

## 2018-06-27 NOTE — Progress Notes (Signed)
Patient ID: Claudia Cantrell, female   DOB: 1946-02-28, 72 y.o.   MRN: [id number]     HPI: Claudia Cantrell is a 72 y.o. female who presents to the office today for a 3 month follow-up evaluation.  Claudia Cantrell suffered remote myocardial infarctions in 1991 and 1992 and at that time underwent PTCA by Dr. Lamount Cohen.  She has a history of hypertension, hyperlipidemia, obstructive sleep apnea on CPAP therapy, obesity, and hypothyroidism with multinodular goiter. In October 2011 she underwent cardiac catheterization after she had experienced episodes of chest pain. Catheterization showed preserved LV contractility although the was a very small focal area of severe hypocontractility involving the mid inferior wall as well as low posterior lateral wall in this patient status post remote myocardial infarction approximately 20 years ago treated with PTCA. At the time of her catheterization she did not have significant carotid obstructive disease and only had mild 10-20% narrowing at the ostium of the LAD, 20% irregularity in the mid AV groove circumflex, and 20% mid RCA and 20% posterior lateral irregularities. Subsequently, she has done well from a cardiovascular standpoint.  She is a history of a multinodular goiter and in 2014, underwent thyroidectomy and tolerated this well from a cardiovascular standpoint.  Since I saw her, she broke her leg in 2016.  She has noticed some increasing shortness of breath particularly when she eats chocolate.  She has been followed by Dr. Jeanann Lewandowsky, for primary care.  She developed atrial fibrillation this year and was admitted August 2017 with AF with rapid ventricular response.  With a cha2ds2vasc score of 5.  She was started on eliquis..  She underwent successful TEE guided cardioversion with restoration of sinus rhythm.  She saw [name] on 08/08/2016 for hospital follow-up and was doing well.  An echo Doppler study in the setting of AF showed an ejection fraction of  40-45%.  She underwent a nuclear perfusion study on August 30 117, which was low risk and suggested the possibility of a small basal to mid inferolateral defect.  There was no ischemia.  She has obstructive sleep apnea and has been on CPAP therapy since 2011.  When I saw her in October 2017, her CPAP machine was broken.  She was 100% compliant and on her prior sleep study.  She had been utilizing CPAP at 9 cm water pressure.    She received a new Medical illustrator  Sense 10 AutoSet for her CPAP unit from [employer] care as a DME company.  Two-month of compliance data were reviewed.  11/06/2016 through 12/05/2016 usage stays was 97% and usage greater than 4 hours was 97%.  She was averaging 7 hours and 9 minutes.  AHI was excellent at 0.9 at her 9 cm water pressure.  There was no leak.  She had had issues with intermittent function over new machine, which was under warranty.  This has been followed up with her DME company.   She was seen by [name] in September, October, and November 2018.  She has had issues with lower extremity edema and some chest congestion.  She had been using NyQuil.  Her levothyroxine dose was increased. .  She has been taking amlodipine 5 mg, Toprol-XL 50 mg for hypertension.  Levothyroxine 112 g for hypothyroidism.  She is on eliquis and denies any recent awareness of abnormal heart rhythm and is unaware of any recurrent atrial fibrillation.  She is on rosuvastatin 40 mg for hyperlipidemia.  When I saw her in  January 2019 a download was obtained from 11/16/2017 through 12/15/2017.  This showed 93% of usage stays with average use is at 7 hours and 26 minutes.  She is at 9 cm water pressure.  AHI is excellent at 0.7.  There is some occasional leaking her mask.  During that visit, her blood pressure was elevated and I suggested a slight titration of amlodipine from 5 mg up to 7.5 mg daily and that she continue her current dose of Toprol-XL 50 mg.   I last saw her in April 2019 at which time her  blood pressure was elevated and I added losartan 25 mg to take in addition to her amlodipine and Toprol for more optimal blood pressure control.  She was using CPAP with 100% compliant.  BMI was 37.94 and weight loss as well as exercise was recommended.   She presents to the office today thankful for the time that I spent with her at her office visit cussing the importance of weight loss exercise improve diet.  Over the past several months she has lost 12 pounds.  She feels better.  She has more energy.  She denies chest pain or shortness of breath.  She has changed her diet.  She presents for reevaluation.   Past Medical History:  Diagnosis Date  . Arthritis    rheumatoid  . CAD (coronary artery disease)    a. 1992 s/p MI and PTCA of unknown vessel;  b. 09/2010 Cath: LM nl, LAD 20p, LCX 51m RCA 121mRPL 20.  . [place] ([facility]  . Family history of anesthesia complication    daughter has difficulty waking   . GERD (gastroesophageal reflux disease)    occ  . Gout 10/08/2016  . Hyperlipidemia   . Hypertensive heart disease   . Nonischemic cardiomyopathy ([facility]   a. 07/2016 Echo: EF 40-45%, mild LVH, inferior akinesis, moderately dilated left atrium, trivial AI and MR.  . Marland KitchenAF (paroxysmal atrial fibrillation) ([facility]   a. 07/2016 Admitted w/ AF RVR-->CHA2DS2VASc = 5-->Eliquis;  b. 07/2016 successful TEE/DCCV.  . Marland Kitchenleep apnea    a. Using CPAP.    Past Surgical History:  Procedure Laterality Date  . ABDOMINAL HYSTERECTOMY  1988  . BACK SURGERY  1994  . CARDIAC CATHETERIZATION  1991 AND 1992   PTCA BY DR CHLamount Cohen. CARDIOVERSION N/A 08/01/2016   Procedure: CARDIOVERSION;  Surgeon: BrLelon PerlaMD;  [facility];  Service: Cardiovascular;  Laterality: N/A;  . ORIF ANKLE FRACTURE Right 04/27/2015   Procedure: OPEN REDUCTION INTERNAL FIXATION (ORIF) RIGHT BIMALLEOLAR ANKLE FRACTURE WITH SYNDESMOSIS FIXATION;  Surgeon: NaLeandrew KoyanagiMD;  [facility] Service: Orthopedics;   Laterality: Right;  . TEE WITHOUT CARDIOVERSION N/A 08/01/2016   Procedure: TRANSESOPHAGEAL ECHOCARDIOGRAM (TEE);  Surgeon: BrLelon PerlaMD;  [facility];  Service: Cardiovascular;  Laterality: N/A;  . THYROIDECTOMY  11/24/2013   DR INDalbert Batman. THYROIDECTOMY N/A 11/24/2013   Procedure: TOTAL THYROIDECTOMY;  Surgeon: HaAdin HectorMD;  [facility];  Laterality: N/A;  . TRANSTHORACIC ECHOCARDIOGRAM  01/15/2013   EF 55% TO 65%. PROBABLE MILD HYPOKINESIS OF THE INFERIOR MYOCARDIUM. GRADE 1 DIASTOLIC DYSFUNCTION. TRIAL AR.LA IS MILDLY DILATED.    Allergies  Allergen Reactions  . Codeine Anxiety    Current Outpatient Medications  Medication Sig Dispense Refill  . Acetaminophen (TYLENOL EXTRA STRENGTH PO) Take 2 tablets by mouth as needed.    . Marland Kitchenllopurinol (ZYLOPRIM) 300  MG tablet Take 1 tablet (300 mg total) by mouth daily. 30 tablet 2  . amLODipine (NORVASC) 5 MG tablet Take 1.5 tablets (7.5 mg total) by mouth daily. 135 tablet 3  . Biotin 10000 MCG TABS Take 1 tablet by mouth daily.    . Cholecalciferol (VITAMIN D-3 PO) Take 2,000 Units by mouth daily.     Marland Kitchen Cod Liver Oil 1000 MG CAPS Take 1 capsule by mouth daily.    . colchicine 0.6 MG tablet Take 1 tablet (0.6 mg total) by mouth daily as needed. 60 tablet 1  . ELIQUIS 5 MG TABS tablet TAKE 1 TABLET BY MOUTH TWICE DAILY 180 tablet 1  . Glucosamine-Chondroit-Vit C-Mn (GLUCOSAMINE CHONDR 1500 COMPLX) CAPS Take 1 capsule daily by mouth.    . metoprolol succinate (TOPROL-XL) 50 MG 24 hr tablet Take 1 tablet (50 mg total) by mouth daily. Take with or immediately following a meal. 30 tablet 1  . olmesartan (BENICAR) 20 MG tablet Take 1 tablet (20 mg total) by mouth daily. 90 tablet 3  . Omega 3 1200 MG CAPS Take by mouth daily.    . Probiotic Product (PROBIOTIC-10) CAPS Take 1 capsule by mouth daily.    . rosuvastatin (CRESTOR) 40 MG tablet Take 1 tablet (40 mg total) by mouth daily. 90 tablet 3  .  SYNTHROID 112 MCG tablet TAKE 1 TABLET BY MOUTH ONCE DAILY 30 tablet 2   No current facility-administered medications for this visit.     Social History   Socioeconomic History  . Marital status: Widowed    Spouse name: Not on file  . Number of children: Not on file  . Years of education: Not on file  . Highest education level: Not on file  Occupational History  . Not on file  Social Needs  . Financial resource strain: Not on file  . Food insecurity:    Worry: Not on file    Inability: Not on file  . Transportation needs:    Medical: Not on file    Non-medical: Not on file  Tobacco Use  . Smoking status: Former Smoker    Packs/day: 1.00    Years: 15.00    Pack years: 15.00    Types: Cigarettes    Last attempt to quit: 12/10/1990    Years since quitting: 27.5  . Smokeless tobacco: Never Used  . Tobacco comment: occ alcohol  Substance and Sexual Activity  . Alcohol use: Yes    Comment: very rare  . Drug use: No  . Sexual activity: Not on file  Lifestyle  . Physical activity:    Days per week: Not on file    Minutes per session: Not on file  . Stress: Not on file  Relationships  . Social connections:    Talks on phone: Not on file    Gets together: Not on file    Attends religious service: Not on file    Active member of club or organization: Not on file    Attends meetings of clubs or organizations: Not on file    Relationship status: Not on file  . Intimate partner violence:    Fear of current or ex partner: Not on file    Emotionally abused: Not on file    Physically abused: Not on file    Forced sexual activity: Not on file  Other Topics Concern  . Not on file  Social History Narrative  . Not on file    Family History  Problem  Relation Age of Onset  . Heart disease Mother   . Sudden death Mother   . Diabetes Father   . Stroke Father   . Bone cancer Sister   . Sudden death Brother   . Melanoma Brother   . Heart disease Brother    Social history  is notable that she is widowed and has 2 children plus one adopted child as well as 2 grandchildren. She does not routinely exercise. There is no tobacco or alcohol use.   ROS General: Negative; No fevers, chills, or night sweats;  HEENT: Negative; No changes in vision or hearing, sinus congestion, difficulty swallowing Pulmonary: Negative; No cough, wheezing, shortness of breath, hemoptysis Cardiovascular: No recent chest pain, dyspnea. GI: Negative; No nausea, vomiting, diarrhea, or abdominal pain GU: Negative; No dysuria, hematuria, or difficulty voiding Musculoskeletal: Negative; no myalgias, joint pain, or weakness Hematologic/Oncology: Negative; no easy bruising, bleeding Endocrine: History of multinodular goiter status post thyroidectomy Neuro: Negative; no changes in balance, headaches Skin: Negative; No rashes or skin lesions Psychiatric: Negative; No behavioral problems, depression Sleep: Positive obstructive sleep apnea.  She admits to 100% compliance.  Her machine is broken.Other comprehensive 14 point system review is negative.   PE BP 122/86   Pulse (!) 57   Ht _0  (1.651 m)   Wt 216 lb (98 kg)   BMI 35.94 kg/m    Repeat blood pressure by me was 124/82  Wt Readings from Last 3 Encounters:  06/27/18 216 lb (98 kg)  05/01/18 219 lb 12.8 oz (99.7 kg)  03/17/18 228 lb (103.4 kg)   General: Alert, oriented, no distress.  Skin: normal turgor, no rashes, warm and dry HEENT: Normocephalic, atraumatic. Pupils equal round and reactive to light; sclera anicteric; extraocular muscles intact;  Nose without nasal septal hypertrophy Mouth/Parynx benign; Mallinpatti scale 3 Neck: No JVD, no carotid bruits; normal carotid upstroke Lungs: clear to ausculatation and percussion; no wheezing or rales Chest wall: without tenderness to palpitation Heart: PMI not displaced, RRR, s1 s2 normal, 1/6 systolic murmur, no diastolic murmur, no rubs, gallops, thrills, or heaves Abdomen:  Central adiposity; soft, nontender; no hepatosplenomehaly, BS+; abdominal aorta nontender and not dilated by palpation. Back: no CVA tenderness Pulses 2+ Musculoskeletal: full range of motion, normal strength, no joint deformities Extremities: no clubbing cyanosis or edema, Homan's sign negative  Neurologic: grossly nonfocal; Cranial nerves grossly wnl Psychologic: Normal mood and affect  ECG (independently read by me): Sinus bradycardia 57 bpm.  Low voltage.  Poor R wave progression.  April 2019 ECG (independently read by me): Sinus bradycardia 53 bpm.  Nonspecific T changes.  Normal intervals.  December 16, 2017 ECG (independently read by me): Sinus bradycardia 57 bpm.  Q wave in lead 3.  Poor R wave progression V1 through V3.  October  2017 ECG (independently read by me): Sinus bradycardia 52 bpm.  Poor anterior R-wave progression.  Normal intervals.  October 2014 ECG: Sinus rhythm at 69 beats per minute. Normal intervals. Nonspecific T changes.  LABS:  BMP Latest Ref Rng & Units 04/02/2018 12/12/2017 09/20/2017  Glucose 65 - 99 mg/dL 90 82 88  BUN 8 - 27 mg/dL _1 Creatinine 0.57 - 1.00 mg/dL 1.14(H) 1.05(H) 1.48(H)  BUN/Creat Ratio 12 - _2 Sodium 134 - 144 mmol/L 145(H) 142 144  Potassium 3.5 - 5.2 mmol/L 4.1 4.1 3.9  Chloride 96 - 106 mmol/L 107(H) 105 102  CO2 20 - 29 mmol/L 23 22 21  Calcium 8.7 - 10.3 mg/dL 10.0 9.5 9.8   Hepatic Function Latest Ref Rng & Units 02/17/2018 12/12/2017 09/20/2017  Total Protein 6.0 - 8.5 g/dL 7.6 8.2 8.1  Albumin 3.6 - 4.8 g/dL - 4.5 4.5  AST 0 - 40 IU/L - 18 19  ALT 0 - 32 IU/L - 17 14  Alk Phosphatase 39 - 117 IU/L - 128(H) 132(H)  Total Bilirubin 0.0 - 1.2 mg/dL - 0.6 0.4  Bilirubin, Direct 0.00 - 0.40 mg/dL - - 0.12   CBC Latest Ref Rng & Units 08/27/2017 04/09/2017 10/09/2016  WBC 3.4 - 10.8 x10E3/uL 3.1(L) 4.5 3.5(L)  Hemoglobin 11.1 - 15.9 g/dL 15.6 14.7 15.0  Hematocrit 34.0 - 46.6 % 46.3 45.5(H) 45.8(H)  Platelets 150 -  379 x10E3/uL 268 290 248   Lab Results  Component Value Date   MCV 87 08/27/2017   MCV 88.9 04/09/2017   MCV 89.8 10/09/2016   Lab Results  Component Value Date   TSH 1.640 02/17/2018    Lipid Panel     Component Value Date/Time   CHOL 169 12/12/2017 1141   TRIG 161 (H) 12/12/2017 1141   HDL 42 12/12/2017 1141   CHOLHDL 4.0 12/12/2017 1141   CHOLHDL 4.5 07/30/2016 0550   VLDL 26 07/30/2016 0550   LDLCALC 95 12/12/2017 1141   IMPRESSION:  No diagnosis found.   ASSESSMENT AND PLAN: Claudia Cantrell is 72 year-old [place] American female who suffered a remote myocardial infarction  and underwent PTCA by Dr. Sherald Barge 27 years ago.  Cardiac catheterization in 2011 demonstrated a small focal wall motion abnormality in the inferior and posterior lateral wall concordant with his myocardial infarction. She has mild nonobstructive CAD. Her last  nuclear perfusion study shows an ejection fraction of 55% and findings consistent with a small area of scar in the basal and mid inferolateral wall concordant with her left circumflex infarct.  She developed atrial fibrillation in August 2017 and underwent successful TEE cardioversion.  When I last saw her, she was hypertensive and losartan was added to her medication.  Due to generic issues, this ultimately was switched to olmesartan and she now takes 20 mg daily in addition to Toprol-XL 50 mg and amlodipine 7.5 mg.  Her blood pressure today is now stable.  She has hyperlipidemia and is on rosuvastatin 40 mg.  She continues to use CPAP with 100% compliance.  She has a history of hypothyroidism on levothyroxine 112 mcg, TSH most recently in March 2019 was normal at 1.64.  She has moderate obesity.  I commended her on her 12 pound weight loss and her dietary modification.  She has given up fried foods and is now eating more of a Mediterranean style diet.  We discussed the importance of continued weight loss and exercising at least 5 days/week.  I will see her  in 6 months for reevaluation and I said a weight loss goal at that office visit of approximately 20 pounds with weight at least less than 200.  I will see her for follow-up evaluation or sooner if problems arise.   Time spent: 25 minutes  Troy Sine, MD, [facility]  06/27/2018 10:19 AM

## 2018-06-27 NOTE — Patient Instructions (Addendum)
Medication Instructions:  Your physician recommends that you continue on your current medications as directed. Please refer to the Current Medication list given to you today.   Labwork: none  Testing/Procedures: none  Follow-Up: Your physician wants you to follow-up in: 6 months with Dr. Claiborne Billings. You will receive a reminder letter in the mail two months in advance. If you don't receive a letter, please call our office to schedule the follow-up appointment.   Any Other Special Instructions Will Be Listed Below (If Applicable).     If you need a refill on your cardiac medications before your next appointment, please call your pharmacy.

## 2018-06-28 ENCOUNTER — Encounter: Payer: Self-pay | Admitting: Cardiovascular Disease

## 2018-07-21 ENCOUNTER — Other Ambulatory Visit: Payer: Self-pay | Admitting: Cardiovascular Disease

## 2018-08-01 ENCOUNTER — Encounter: Payer: Self-pay | Admitting: Family Medicine

## 2018-08-01 DIAGNOSIS — Z1231 Encounter for screening mammogram for malignant neoplasm of breast: Secondary | ICD-10-CM | POA: Diagnosis not present

## 2018-08-01 LAB — HM MAMMOGRAPHY

## 2018-08-15 ENCOUNTER — Telehealth: Payer: Self-pay | Admitting: Cardiovascular Disease

## 2018-08-15 NOTE — Telephone Encounter (Signed)
Spoke with pt. Pt sts that she is currently in the donut hole with her insurance and  cannot afford her Eliquis. Her out of pocket cost for a 90day supply is $600.  Pt sts that she was taking it just once a week to have her supply last. Her pharmacist adv her that she should not do that and she needs to take it twice daily as prescribed. I reiterated the pharmacist instructions and adv her of her increased  Risk for stroke if Eliquis is not taken as prescribed or if therapy is interrupted.  Adv pt that I will leave samples of Eliquis 5mg  bid at the front desk for her to pick up.  I will  fwd a message to [facility] and his nurse to f/u with her regarding patient assistance or possible alternative.  Pt voiced appreciation and verbalized understanding.

## 2018-08-15 NOTE — Telephone Encounter (Signed)
Returned call to patient-advised to call [phone].  Patient will call to see if she is eligible for assistance.

## 2018-08-15 NOTE — Telephone Encounter (Signed)
New Message:    Pt says she is in the [facility] for her Eliquis and she can not afford to get it. She wants to know what can she do please?

## 2018-08-26 NOTE — Progress Notes (Signed)
Office Visit Note  Patient: Claudia Cantrell             Date of Birth: 07-21-1946           MRN: [id number]             PCP: Girtha Rm, NP-C Referring: Girtha Rm, NP-C Visit Date: 09/08/2018 Occupation: @GUAROCC @  Subjective:  Eye redness   History of Present Illness: Claudia Cantrell is a 72 y.o. female with history of gout. She did not start on Allopurinol 300 mg by mouth daily or colchicine.  She has not had any recent gout flares.  She reports that since her last visit she has lost 19 pounds.  She states that she has tried to limit late night eating, gluten, sugar and urine.  She states that she is tried increasing her intake of veggies and has been walking for exercise.  She said her joint pain has improved significantly since losing weight.  She denies any joint swelling at this time.  She states that her myalgias have also improved.  She states that she is also having less morning stiffness. She reports eye redness and itching.  She has not follow up with her ophthalmologist in 3 years.    Activities of Daily Living:  Patient reports morning stiffness for 2  minutes.   Patient Denies nocturnal pain.  Difficulty dressing/grooming: Denies Difficulty climbing stairs: Denies Difficulty getting out of chair: Denies Difficulty using hands for taps, buttons, cutlery, and/or writing: Denies  Review of Systems  Constitutional: Positive for fatigue and weight loss (Intentional).  HENT: Negative for mouth sores, mouth dryness and nose dryness.   Eyes: Positive for redness. Negative for pain, visual disturbance and dryness.  Respiratory: Negative for cough, hemoptysis, shortness of breath and difficulty breathing.   Cardiovascular: Negative for chest pain, palpitations, hypertension and swelling in legs/feet.  Gastrointestinal: Negative for blood in stool, constipation and diarrhea.  Endocrine: Negative for increased urination.  Genitourinary: Negative for painful urination.    Musculoskeletal: Positive for arthralgias, joint pain, myalgias, morning stiffness and myalgias. Negative for joint swelling, muscle weakness and muscle tenderness.  Skin: Negative for color change, pallor, rash, hair loss, nodules/bumps, skin tightness, ulcers and sensitivity to sunlight.  Allergic/Immunologic: Negative for susceptible to infections.  Neurological: Negative for dizziness, numbness, headaches and weakness.  Hematological: Negative for swollen glands.  Psychiatric/Behavioral: Negative for depressed mood and sleep disturbance. The patient is not nervous/anxious.     PMFS History:  Patient Active Problem List   Diagnosis Date Noted  . Persistent proteinuria 02/17/2018  . 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 Vitamin D deficiency 03/26/2017  . Medication monitoring encounter 03/26/2017  . Gout 10/08/2016  . PAF (paroxysmal atrial fibrillation) ([place])   . Hypertensive heart disease   . Hyperlipidemia   . CAD (coronary artery disease)   . Nonischemic cardiomyopathy ([place])   . Chest pain with moderate risk for cardiac etiology 08/02/2016  . Atrial fibrillation ([place]) 07/29/2016  . Closed right ankle fracture 04/24/2015  . Ankle fracture, bimalleolar, closed 04/24/2015  . 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 CAD in native artery 10/09/2013  . OSA on CPAP 10/09/2013  . Essential hypertension, benign 05/27/2013    Past Medical History:  Diagnosis Date  . Arthritis    rheumatoid  . CAD (coronary artery disease)    a. 1992 s/p MI and PTCA of unknown vessel;  b. 09/2010 Cath: LM nl, LAD 20p, LCX  67m, RCA 8m, RPL 20.  . Cancer ([place])   . Family history of anesthesia complication    daughter has difficulty waking   . GERD (gastroesophageal reflux disease)    occ  . Gout 10/08/2016  . Hyperlipidemia   . Hypertensive heart disease   . Nonischemic cardiomyopathy ([place])    a. 07/2016 Echo: EF 40-45%, mild LVH, inferior  akinesis, moderately dilated left atrium, trivial AI and MR.  Marland Kitchen PAF (paroxysmal atrial fibrillation) ([place])    a. 07/2016 Admitted w/ AF RVR-->CHA2DS2VASc = 5-->Eliquis;  b. 07/2016 successful TEE/DCCV.  Marland Kitchen Sleep apnea    a. Using CPAP.    Family History  Problem Relation Age of Onset  . Heart disease Mother   . Sudden death Mother   . Diabetes Father   . Stroke Father   . Bone cancer Sister   . Sudden death Brother   . Melanoma Brother   . Heart disease Brother    Past Surgical History:  Procedure Laterality Date  . ABDOMINAL HYSTERECTOMY  1988  . BACK SURGERY  1994  . CARDIAC CATHETERIZATION  1991 AND 1992   PTCA BY DR Lamount Cohen  . CARDIOVERSION N/A 08/01/2016   Procedure: CARDIOVERSION;  Surgeon: Lelon Perla, MD;  Location: [facility] ENDOSCOPY;  Service: Cardiovascular;  Laterality: N/A;  . ORIF ANKLE FRACTURE Right 04/27/2015   Procedure: OPEN REDUCTION INTERNAL FIXATION (ORIF) RIGHT BIMALLEOLAR ANKLE FRACTURE WITH SYNDESMOSIS FIXATION;  Surgeon: Leandrew Koyanagi, MD;  Location: [place];  Service: Orthopedics;  Laterality: Right;  . TEE WITHOUT CARDIOVERSION N/A 08/01/2016   Procedure: TRANSESOPHAGEAL ECHOCARDIOGRAM (TEE);  Surgeon: Lelon Perla, MD;  Location: [facility] ENDOSCOPY;  Service: Cardiovascular;  Laterality: N/A;  . THYROIDECTOMY  11/24/2013   DR Dalbert Batman  . THYROIDECTOMY N/A 11/24/2013   Procedure: TOTAL THYROIDECTOMY;  Surgeon: Adin Hector, MD;  Location: [place];  Service: General;  Laterality: N/A;  . TRANSTHORACIC ECHOCARDIOGRAM  01/15/2013   EF 55% TO 65%. PROBABLE MILD HYPOKINESIS OF THE INFERIOR MYOCARDIUM. GRADE 1 DIASTOLIC DYSFUNCTION. TRIAL AR.LA IS MILDLY DILATED.   Social History   Social History Narrative  . Not on file    Objective: Vital Signs: BP (!) 170/90 (BP Location: Right Arm, Patient Position: Sitting, Cuff Size: Normal)   Pulse 68   Resp 14   Ht 5\' 5"  (1.651 m)   Wt 211 lb (95.7 kg)   BMI 35.11 kg/m    Physical Exam  Constitutional: She  is oriented to person, place, and time. She appears well-developed and well-nourished.  HENT:  Head: Normocephalic and atraumatic.  Eyes: Conjunctivae and EOM are normal.  Neck: Normal range of motion.  Cardiovascular: Normal rate, regular rhythm, normal heart sounds and intact distal pulses.  Pulmonary/Chest: Effort normal and breath sounds normal.  Abdominal: Soft. Bowel sounds are normal.  Lymphadenopathy:    She has no cervical adenopathy.  Neurological: She is alert and oriented to person, place, and time.  Skin: Skin is warm and dry. Capillary refill takes less than 2 seconds.  Psychiatric: She has a normal mood and affect. Her behavior is normal.  Nursing note and vitals reviewed.    Musculoskeletal Exam: C-spine, thoracic spine, lumbar spine good range of motion.  No midline spinal tenderness.  Shoulder joints, elbow joints, wrist joints, MCPs, PIPs, DIPs good range of motion no synovitis.  She is complete fist formation bilaterally.  Hip joints, knee joints, ankle joints, MTPs, PIPs, DIPs good range of motion no synovitis.  No warmth  or effusion bilateral knee joints.  No tenderness of ankle joints.  She has mild pedal edema bilaterally.  No tenderness of trochanter bursa bilaterally.  CDAI Exam: CDAI Score: Not documented Patient Global Assessment: Not documented; Provider Global Assessment: Not documented Swollen: Not documented; Tender: Not documented Joint Exam   Not documented   There is currently no information documented on the homunculus. Go to the Rheumatology activity and complete the homunculus joint exam.  Investigation: No additional findings.  Imaging: No results found.  Recent Labs: Lab Results  Component Value Date   WBC 3.1 (L) 08/27/2017   HGB 15.6 08/27/2017   PLT 268 08/27/2017   NA 145 (H) 04/02/2018   K 4.1 04/02/2018   CL 107 (H) 04/02/2018   CO2 23 04/02/2018   GLUCOSE 90 04/02/2018   BUN 16 04/02/2018   CREATININE 1.14 (H) 04/02/2018    BILITOT 0.6 12/12/2017   ALKPHOS 128 (H) 12/12/2017   AST 18 12/12/2017   ALT 17 12/12/2017   PROT 7.6 02/17/2018   ALBUMIN 4.5 12/12/2017   CALCIUM 10.0 04/02/2018   GFRAA 57 (L) 04/02/2018    Speciality Comments: No specialty comments available.  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 uric acid: 01/13/2018 1.3: She has not had any recent gout flares.  She discontinued Uloric after her last visit in March 2019.  We switched her to allopurinol due to the patient being apprehensive to stay on Uloric due to the black box warning.  She did not want to start on Allopurinol due to not recent gout flares.  She has no joint pain or joint swelling.  She has been making dietary changes and exercising more frequently.  She has lost 19lbs since her last visit, and she plans on continuing the weight loss program.  Her arthralgias and myalgias have improved significantly since losing weight.  We will check uric acid level today.   Medication monitoring encounter - She did not start on Allpurinol after her last visit.   Hyperuricemia: We will check uric acid level today.   Trigger finger of left thumb: Resolved.   Eye redness: Bilateral conjunctiva erythema noted.  She has noticed increased redness and itching of bilateral eyes.  She was advised to schedule an appointment with ophthalmology for evaluation if her symptoms persist. She can also try a anti-histamine eye drop OTC.    Other medical conditions are listed as follows:   History of vitamin D deficiency  History of atrial fibrillation  History of hyperlipidemia  History of obesity  History of coronary artery disease  CAD in native artery  History of hypertension  History of hypothyroidism  OSA on CPAP   Orders: No orders of the defined types were placed in this encounter.  No orders of the  defined types were placed in this encounter.   Face-to-face time spent with patient was 30 minutes. Greater than 50% of time was spent in counseling and coordination of care.  Follow-Up Instructions: Return in about 6 months (around 03/09/2019) for Gout.   Ofilia Neas, PA-C   I examined and evaluated the patient with Hazel Sams PA.  Patient did not start allopurinol after the last visit.  She denies having any gout flares.  We will check a uric acid today.  She had no synovitis on examination.  If her uric acid is elevated we  will add allopurinol.  She had intentional weight loss which has been very helpful as regards to her generalized arthralgias.  The plan of care was discussed as noted above.  Bo Merino, MD  Note - This record has been created using Editor, commissioning.  Chart creation errors have been sought, but may not always  have been located. Such creation errors do not reflect on  the standard of medical care.

## 2018-09-08 ENCOUNTER — Ambulatory Visit: Payer: PPO | Admitting: Rheumatology

## 2018-09-08 ENCOUNTER — Encounter: Payer: Self-pay | Admitting: Rheumatology

## 2018-09-08 ENCOUNTER — Other Ambulatory Visit: Payer: Self-pay | Admitting: Family Medicine

## 2018-09-08 VITALS — BP 170/90 | HR 68 | Resp 14 | Ht 65.0 in | Wt 211.0 lb

## 2018-09-08 DIAGNOSIS — I2510 Atherosclerotic heart disease of native coronary artery without angina pectoris: Secondary | ICD-10-CM

## 2018-09-08 DIAGNOSIS — M65312 Trigger thumb, left thumb: Secondary | ICD-10-CM

## 2018-09-08 DIAGNOSIS — E790 Hyperuricemia without signs of inflammatory arthritis and tophaceous disease: Secondary | ICD-10-CM | POA: Diagnosis not present

## 2018-09-08 DIAGNOSIS — G4733 Obstructive sleep apnea (adult) (pediatric): Secondary | ICD-10-CM

## 2018-09-08 DIAGNOSIS — Z8679 Personal history of other diseases of the circulatory system: Secondary | ICD-10-CM

## 2018-09-08 DIAGNOSIS — M1A09X0 Idiopathic chronic gout, multiple sites, without tophus (tophi): Secondary | ICD-10-CM

## 2018-09-08 DIAGNOSIS — Z5181 Encounter for therapeutic drug level monitoring: Secondary | ICD-10-CM

## 2018-09-08 DIAGNOSIS — Z9989 Dependence on other enabling machines and devices: Secondary | ICD-10-CM

## 2018-09-08 DIAGNOSIS — Z8639 Personal history of other endocrine, nutritional and metabolic disease: Secondary | ICD-10-CM | POA: Diagnosis not present

## 2018-09-08 DIAGNOSIS — H5789 Other specified disorders of eye and adnexa: Secondary | ICD-10-CM | POA: Diagnosis not present

## 2018-09-09 LAB — COMPLETE METABOLIC PANEL WITH GFR
AG Ratio: 1.3 (calc) (ref 1.0–2.5)
ALBUMIN MSPROF: 4.4 g/dL (ref 3.6–5.1)
ALKALINE PHOSPHATASE (APISO): 101 U/L (ref 33–130)
ALT: 11 U/L (ref 6–29)
AST: 16 U/L (ref 10–35)
BILIRUBIN TOTAL: 0.5 mg/dL (ref 0.2–1.2)
BUN: 13 mg/dL (ref 7–25)
CHLORIDE: 106 mmol/L (ref 98–110)
CO2: 28 mmol/L (ref 20–32)
Calcium: 9.4 mg/dL (ref 8.6–10.4)
Creat: 0.92 mg/dL (ref 0.60–0.93)
GFR, Est African American: 73 mL/min/{1.73_m2} (ref >=60)
GFR, Est Non African American: 63 mL/min/{1.73_m2} (ref >=60)
GLUCOSE: 90 mg/dL (ref 65–99)
Globulin: 3.3 g/dL (calc) (ref 1.9–3.7)
POTASSIUM: 4.1 mmol/L (ref 3.5–5.3)
Sodium: 143 mmol/L (ref 135–146)
Total Protein: 7.7 g/dL (ref 6.1–8.1)

## 2018-09-09 LAB — CBC WITH DIFFERENTIAL/PLATELET
Basophils Absolute: 19 cells/uL (ref 0–200)
Basophils Relative: 0.5 %
Eosinophils Absolute: 111 cells/uL (ref 15–500)
Eosinophils Relative: 3 %
HCT: 46.7 % — ABNORMAL HIGH (ref 35.0–45.0)
Hemoglobin: 15 g/dL (ref 11.7–15.5)
LYMPHS ABS: 1147 {cells}/uL (ref 850–3900)
MCH: 28.7 pg (ref 27.0–33.0)
MCHC: 32.1 g/dL (ref 32.0–36.0)
MCV: 89.5 fL (ref 80.0–100.0)
MONOS PCT: 11.1 %
MPV: 8.9 fL (ref 7.5–12.5)
NEUTROS PCT: 54.4 %
Neutro Abs: 2013 cells/uL (ref 1500–7800)
PLATELETS: 271 10*3/uL (ref 140–400)
RBC: 5.22 10*6/uL — ABNORMAL HIGH (ref 3.80–5.10)
RDW: 13.7 % (ref 11.0–15.0)
TOTAL LYMPHOCYTE: 31 %
WBC mixed population: 411 cells/uL (ref 200–950)
WBC: 3.7 10*3/uL — AB (ref 3.8–10.8)

## 2018-09-09 LAB — URIC ACID: Uric Acid, Serum: 6.8 mg/dL (ref 2.5–7.0)

## 2018-09-09 NOTE — Progress Notes (Signed)
Uric acid is 6.8.  She has not had any recent gout flares.  Discussed with Dr. Estanislado Pandy and she is ok with patient continuing to make dietary changes.  Please advised patient to notify us she has signs of a flare and she may need to restart on urate lowering medications at that time.  CMP WNL. CBC stable.

## 2018-10-30 ENCOUNTER — Encounter: Payer: Self-pay | Admitting: Rheumatology

## 2018-10-30 ENCOUNTER — Encounter (INDEPENDENT_AMBULATORY_CARE_PROVIDER_SITE_OTHER): Payer: Self-pay

## 2018-10-30 ENCOUNTER — Ambulatory Visit: Payer: PPO | Admitting: Rheumatology

## 2018-10-30 VITALS — BP 152/84 | HR 81 | Resp 13 | Ht 65.0 in | Wt 212.0 lb

## 2018-10-30 DIAGNOSIS — E790 Hyperuricemia without signs of inflammatory arthritis and tophaceous disease: Secondary | ICD-10-CM | POA: Diagnosis not present

## 2018-10-30 DIAGNOSIS — Z8639 Personal history of other endocrine, nutritional and metabolic disease: Secondary | ICD-10-CM | POA: Diagnosis not present

## 2018-10-30 DIAGNOSIS — M1A09X0 Idiopathic chronic gout, multiple sites, without tophus (tophi): Secondary | ICD-10-CM | POA: Diagnosis not present

## 2018-10-30 DIAGNOSIS — Z9989 Dependence on other enabling machines and devices: Secondary | ICD-10-CM

## 2018-10-30 DIAGNOSIS — M25511 Pain in right shoulder: Secondary | ICD-10-CM

## 2018-10-30 DIAGNOSIS — Z8679 Personal history of other diseases of the circulatory system: Secondary | ICD-10-CM

## 2018-10-30 DIAGNOSIS — G4733 Obstructive sleep apnea (adult) (pediatric): Secondary | ICD-10-CM | POA: Diagnosis not present

## 2018-10-30 DIAGNOSIS — Z5181 Encounter for therapeutic drug level monitoring: Secondary | ICD-10-CM

## 2018-10-30 MED ORDER — TRIAMCINOLONE ACETONIDE 40 MG/ML IJ SUSP
40.0000 mg | INTRAMUSCULAR | Status: AC | PRN
  Administered 2018-10-30: 40 mg via INTRA_ARTICULAR

## 2018-10-30 MED ORDER — COLCHICINE 0.6 MG PO TABS: ORAL_TABLET | ORAL | 1 refills | Status: DC

## 2018-10-30 MED ORDER — LIDOCAINE HCL 1 % IJ SOLN
1.0000 mL | INTRAMUSCULAR | Status: AC | PRN
  Administered 2018-10-30: 1 mL

## 2018-10-30 MED ORDER — ALLOPURINOL 100 MG PO TABS: 100.0000 mg | ORAL_TABLET | Freq: Every day | ORAL | 2 refills | Status: DC

## 2018-10-30 NOTE — Progress Notes (Signed)
Pharmacy Note   Subjective:  Patient presents today to the [facility] to see Dr. Estanislado Pandy.  Patient was prescribed allopurinol for gout.  Patient was seen by the pharmacist for counseling on allopurinol and colchicine.   Objective: CBC    Component Value Date/Time   WBC 3.7 (L) 09/08/2018 1114   RBC 5.22 (H) 09/08/2018 1114   HGB 15.0 09/08/2018 1114   HGB 15.6 08/27/2017 1130   HCT 46.7 (H) 09/08/2018 1114   HCT 46.3 08/27/2017 1130   PLT 271 09/08/2018 1114   PLT 268 08/27/2017 1130   MCV 89.5 09/08/2018 1114   MCV 87 08/27/2017 1130   MCH 28.7 09/08/2018 1114   MCHC 32.1 09/08/2018 1114   RDW 13.7 09/08/2018 1114   RDW 14.7 08/27/2017 1130   LYMPHSABS 1,147 09/08/2018 1114   MONOABS 585 04/09/2017 1248   EOSABS 111 09/08/2018 1114   BASOSABS 19 09/08/2018 1114    CMP     Component Value Date/Time   NA 143 09/08/2018 1114   NA 145 (H) 04/02/2018 0935   K 4.1 09/08/2018 1114   CL 106 09/08/2018 1114   CO2 28 09/08/2018 1114   GLUCOSE 90 09/08/2018 1114   BUN 13 09/08/2018 1114   BUN 16 04/02/2018 0935   CREATININE 0.92 09/08/2018 1114   CALCIUM 9.4 09/08/2018 1114   PROT 7.7 09/08/2018 1114   PROT 7.6 02/17/2018 1113   ALBUMIN 4.5 12/12/2017 1140   AST 16 09/08/2018 1114   ALT 11 09/08/2018 1114   ALKPHOS 128 (H) 12/12/2017 1140   BILITOT 0.5 09/08/2018 1114   BILITOT 0.6 12/12/2017 1140   GFRNONAA 63 09/08/2018 1114   GFRAA 73 09/08/2018 1114    Lab Results  Component Value Date   LABURIC 6.8 09/08/2018     Assessment/Plan:  Counseled patient on the purpose proper use, and adverse effects of  allopurinol and colchicine.  Discussed the importance of taking allopurinol every day to lower uric acid levels.  The possibility of recurrent gout while lowering the uric acid was explained and discussed the importance of taking colchicine daily for one month then as needed for gout flare.  Provided patient with medication education material and  dietary changes.  All questions encouraged and answered.    Mariella Saa, PharmD, [facility] Rheumatology Clinical Pharmacist  10/30/2018 4:02 PM

## 2018-10-30 NOTE — Patient Instructions (Addendum)
Standing Labs We placed an order today for your standing lab work.    Please come back and get your standing labs in 1 month and then 3 months.  We have open lab Monday through Friday from 8:30-11:30 AM and 1:30-4:00 PM  at the office of Dr. Bo Merino.   You may experience shorter wait times on Monday and Friday afternoons. The office is located at [address] [place], [place], [place] [address] No appointment is necessary.   Labs are drawn by [facility].  You may receive a bill from [facility] for your lab work. If you have any questions regarding directions or hours of operation,  please call [phone].   Just as a reminder please drink plenty of water prior to coming for your lab work. Thanks!  Colchicine tablets or capsules What is this medicine? COLCHICINE (KOL chi seen) is for joint pain and swelling due to attacks of acute gouty arthritis. The medicine is also used to treat familial Mediterranean fever. This medicine may be used for other purposes; ask your health care provider or pharmacist if you have questions. COMMON BRAND NAME(S): Colcrys, MITIGARE What should I tell my health care provider before I take this medicine? They need to know if you have any of these conditions: -anemia -blood disorders like leukemia or lymphoma -heart disease -immune system problems -intestinal disease -kidney disease -liver disease -muscle pain or weakness -take other medicines -stomach problems -an unusual or allergic reaction to colchicine, other medicines, lactose, foods, dyes, or preservatives -pregnant or trying to get pregnant -breast-feeding How should I use this medicine? Take this medicine by mouth with a full glass of water. Follow the directions on the prescription label. You can take it with or without food. If it upsets your stomach, take it with food. Take your medicine at regular intervals. Do not take your medicine more often than directed. A special MedGuide will  be given to you by the pharmacist with each prescription and refill. Be sure to read this information carefully each time. Talk to your pediatrician regarding the use of this medicine in children. While this drug may be prescribed for children as young as 70 years old for selected conditions, precautions do apply. Patients over 70 years old may have a stronger reaction and need a smaller dose. Overdosage: If you think you have taken too much of this medicine contact a poison control center or emergency room at once. NOTE: This medicine is only for you. Do not share this medicine with others. What if I miss a dose? If you miss a dose, take it as soon as you can. If it is almost time for your next dose, take only that dose. Do not take double or extra doses. What may interact with this medicine? Do not take this medicine with any of the following medications: -certain medicines for fungal infections like itraconazole This medicine may also interact with the following medications: -alcohol -certain medicines for cholesterol like atorvastatin -certain medicines for coughs and colds -certain medicines to help you breathe better -cyclosporine -digoxin -epinephrine -grapefruit or grapefruit juice -methenamine -other medicines for fungal infection -sodium bicarbonate -some antibiotics like clarithromycin, erythromycin, and telithromycin -some medicines for an irregular heartbeat or other heart problems -some medicines for cancer, like lapatinib and tamoxifen -some medicines for HIV This list may not describe all possible interactions. Give your health care provider a list of all the medicines, herbs, non-prescription drugs, or dietary supplements you use. Also tell them if you smoke,  drink alcohol, or use illegal drugs. Some items may interact with your medicine. What should I watch for while using this medicine? Visit your doctor or health care professional for regular checks on your progress.  You may need periodic blood checks. Alcohol can increase the chance of getting stomach problems and gout attacks. Do not drink alcohol. What side effects may I notice from receiving this medicine? Side effects that you should report to your doctor or health care professional as soon as possible: -allergic reactions like skin rash, itching or hives, swelling of the face, lips, or tongue -fever, chills, or sore throat -muscle tenderness, pain, or weakness -numbness or tingling in hands or feet -unusual bleeding or bruising -unusually weak or tired -vomiting Side effects that usually do not require medical attention (report to your doctor or health care professional if they continue or are bothersome): -diarrhea -hair loss -loss of appetite -stomach pain or nausea This list may not describe all possible side effects. Call your doctor for medical advice about side effects. You may report side effects to FDA at 1-800-FDA-1088. Where should I keep my medicine? Keep out of the reach of children. Store at room temperature between 15 and 30 degrees C (59 and 86 degrees F). Keep container tightly closed. Protect from light. Throw away any unused medicine after the expiration date. NOTE: This sheet is a summary. It may not cover all possible information. If you have questions about this medicine, talk to your doctor, pharmacist, or health care provider.  2018 Elsevier/Gold Standard (2013-05-25 16:48:38) Allopurinol tablets What is this medicine? ALLOPURINOL (al oh PURE i nole) reduces the amount of uric acid the body makes. It is used to treat the symptoms of gout. It is also used to treat or prevent high uric acid levels that occur as a result of certain types of chemotherapy. This medicine may also help patients who frequently have kidney stones. This medicine may be used for other purposes; ask your health care provider or pharmacist if you have questions. COMMON BRAND NAME(S): Zyloprim What  should I tell my health care provider before I take this medicine? They need to know if you have any of these conditions: -kidney or liver disease -an unusual or allergic reaction to allopurinol, other medicines, foods, dyes, or preservatives -pregnant or trying to get pregnant -breast feeding How should I use this medicine? Take this medicine by mouth with a glass of water. Follow the directions on the prescription label. If this medicine upsets your stomach, take it with food or milk. Take your doses at regular intervals. Do not take your medicine more often than directed. Talk to your pediatrician regarding the use of this medicine in children. Special care may be needed. While this drug may be prescribed for children as young as 6 years for selected conditions, precautions do apply. Overdosage: If you think you have taken too much of this medicine contact a poison control center or emergency room at once. NOTE: This medicine is only for you. Do not share this medicine with others. What if I miss a dose? If you miss a dose, take it as soon as you can. If it is almost time for your next dose, take only that dose. Do not take double or extra doses. What may interact with this medicine? Do not take this medicine with the following medication: -didanosine, ddI This medicine may also interact with the following medications: -amoxicillin or ampicillin -azathioprine -certain medicines used to treat gout -certain types of  diuretics -chlorpropamide -cyclosporine -dicumarol -mercaptopurine -tolbutamide -warfarin This list may not describe all possible interactions. Give your health care provider a list of all the medicines, herbs, non-prescription drugs, or dietary supplements you use. Also tell them if you smoke, drink alcohol, or use illegal drugs. Some items may interact with your medicine. What should I watch for while using this medicine? Visit your doctor or health care professional for  regular checks on your progress. If you are taking this medicine to treat gout, you may not have less frequent attacks at first. Keep taking your medicine regularly and the attacks should get better within 2 to 6 weeks. Drink plenty of water (10 to 12 full glasses a day) while you are taking this medicine. This will help to reduce stomach upset and reduce the risk of getting gout or kidney stones. Call your doctor or health care professional at once if you get a skin rash together with chills, fever, sore throat, or nausea and vomiting, if you have blood in your urine, or difficulty passing urine. Do not take vitamin C without asking your doctor or health care professional. Too much vitamin C can increase the chance of getting kidney stones. You may get drowsy or dizzy. Do not drive, use machinery, or do anything that needs mental alertness until you know how this drug affects you. Do not stand or sit up quickly, especially if you are an older patient. This reduces the risk of dizzy or fainting spells. Alcohol can make you more drowsy and dizzy. Alcohol can also increase the chance of stomach problems and increase the amount of uric acid in your blood. Avoid alcoholic drinks. What side effects may I notice from receiving this medicine? Side effects that you should report to your doctor or health care professional as soon as possible: -allergic reactions like skin rash, itching or hives, swelling of the face, lips, or tongue -breathing problems -muscle aches or pains -redness, blistering, peeling or loosening of the skin, including inside the mouth Side effects that usually do not require medical attention (report to your doctor or health care professional if they continue or are bothersome): -changes in taste -diarrhea -indigestion -stomach pain or cramps This list may not describe all possible side effects. Call your doctor for medical advice about side effects. You may report side effects to FDA at  1-800-FDA-1088. Where should I keep my medicine? Keep out of the reach of children. Store at room temperature between 15 and 25 degrees C (59 and 77 degrees F). Protect from light and moisture. Throw away any unused medicine after the expiration date. NOTE: This sheet is a summary. It may not cover all possible information. If you have questions about this medicine, talk to your doctor, pharmacist, or health care provider.  2018 Elsevier/Gold Standard (2008-05-31 14:26:54)

## 2018-10-30 NOTE — Progress Notes (Signed)
Office Visit Note  Patient: Claudia Cantrell             Date of Birth: August 03, 1946           MRN: [id number]             PCP: Girtha Rm, NP-C Referring: Girtha Rm, NP-C Visit Date: 10/30/2018 Occupation: @GUAROCC @  Subjective:  Pain in the right shoulder.   History of Present Illness: Claudia Cantrell is a 72 y.o. female  female with history of gout. She did not start on Allopurinol 300 mg by mouth daily or colchicine and at last visit decided to continue with diet modification.  She denies having any gout flares but is having significant shoulder pain.  The shoulder pain started about 2 days ago.  She asked for a shoulder injection. She describes it as a dull ache and has no improvement after taking Tylenol  States her weight loss has slowed down and hit a plateau.  Her goal is to be less than 200 pounds before the end of the year. Her myalgias and morning stiffness has improved with continued weight loss.    Activities of Daily Living:  Patient reports morning stiffness for 5-10 minutes.   Patient Reports nocturnal pain.  Difficulty dressing/grooming: Denies Difficulty climbing stairs: Denies Difficulty getting out of chair: Denies Difficulty using hands for taps, buttons, cutlery, and/or writing: Denies  Review of Systems  Constitutional: Negative for fatigue.  HENT: Negative for mouth sores, trouble swallowing, trouble swallowing and mouth dryness.   Eyes: Negative for pain, redness, itching and dryness.  Respiratory: Negative for shortness of breath, wheezing and difficulty breathing.   Cardiovascular: Negative for chest pain, palpitations and swelling in legs/feet.  Gastrointestinal: Negative for abdominal pain, constipation, diarrhea, nausea and vomiting.  Endocrine: Negative for increased urination.  Genitourinary: Negative for painful urination, nocturia and pelvic pain.  Musculoskeletal: Positive for arthralgias, joint pain and morning stiffness. Negative for  joint swelling.  Skin: Negative for rash and hair loss.  Allergic/Immunologic: Negative for susceptible to infections.  Neurological: Negative for dizziness, light-headedness, headaches, memory loss and weakness.  Hematological: Negative for bruising/bleeding tendency.  Psychiatric/Behavioral: Negative for confusion. The patient is not nervous/anxious.     PMFS History:  Patient Active Problem List   Diagnosis Date Noted  . Persistent proteinuria 02/17/2018  . 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 Vitamin D deficiency 03/26/2017  . Medication monitoring encounter 03/26/2017  . Gout 10/08/2016  . PAF (paroxysmal atrial fibrillation) ([place])   . Hypertensive heart disease   . Hyperlipidemia   . CAD (coronary artery disease)   . Nonischemic cardiomyopathy ([place])   . Chest pain with moderate risk for cardiac etiology 08/02/2016  . Atrial fibrillation ([place]) 07/29/2016  . Closed right ankle fracture 04/24/2015  . Ankle fracture, bimalleolar, closed 04/24/2015  . 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 CAD in native artery 10/09/2013  . OSA on CPAP 10/09/2013  . Essential hypertension, benign 05/27/2013    Past Medical History:  Diagnosis Date  . Arthritis    rheumatoid  . CAD (coronary artery disease)    a. 1992 s/p MI and PTCA of unknown vessel;  b. 09/2010 Cath: LM nl, LAD 20p, LCX 11m, RCA 26m, RPL 20.  . Cancer ([place])   . Family history of anesthesia complication    daughter has difficulty waking   . GERD (gastroesophageal reflux disease)    occ  . Gout 10/08/2016  .  Hyperlipidemia   . Hypertensive heart disease   . Nonischemic cardiomyopathy ([place])    a. 07/2016 Echo: EF 40-45%, mild LVH, inferior akinesis, moderately dilated left atrium, trivial AI and MR.  Marland Kitchen PAF (paroxysmal atrial fibrillation) ([place])    a. 07/2016 Admitted w/ AF RVR-->CHA2DS2VASc = 5-->Eliquis;  b. 07/2016 successful TEE/DCCV.  Marland Kitchen Sleep apnea    a. Using CPAP.      Family History  Problem Relation Age of Onset  . Heart disease Mother   . Sudden death Mother   . Diabetes Father   . Stroke Father   . Bone cancer Sister   . Sudden death Brother   . Melanoma Brother   . Heart disease Brother    Past Surgical History:  Procedure Laterality Date  . ABDOMINAL HYSTERECTOMY  1988  . BACK SURGERY  1994  . CARDIAC CATHETERIZATION  1991 AND 1992   PTCA BY DR Lamount Cohen  . CARDIOVERSION N/A 08/01/2016   Procedure: CARDIOVERSION;  Surgeon: Lelon Perla, MD;  Location: [facility] ENDOSCOPY;  Service: Cardiovascular;  Laterality: N/A;  . ORIF ANKLE FRACTURE Right 04/27/2015   Procedure: OPEN REDUCTION INTERNAL FIXATION (ORIF) RIGHT BIMALLEOLAR ANKLE FRACTURE WITH SYNDESMOSIS FIXATION;  Surgeon: Leandrew Koyanagi, MD;  Location: [place];  Service: Orthopedics;  Laterality: Right;  . TEE WITHOUT CARDIOVERSION N/A 08/01/2016   Procedure: TRANSESOPHAGEAL ECHOCARDIOGRAM (TEE);  Surgeon: Lelon Perla, MD;  Location: [facility] ENDOSCOPY;  Service: Cardiovascular;  Laterality: N/A;  . THYROIDECTOMY  11/24/2013   DR Dalbert Batman  . THYROIDECTOMY N/A 11/24/2013   Procedure: TOTAL THYROIDECTOMY;  Surgeon: Adin Hector, MD;  Location: [place];  Service: General;  Laterality: N/A;  . TRANSTHORACIC ECHOCARDIOGRAM  01/15/2013   EF 55% TO 65%. PROBABLE MILD HYPOKINESIS OF THE INFERIOR MYOCARDIUM. GRADE 1 DIASTOLIC DYSFUNCTION. TRIAL AR.LA IS MILDLY DILATED.   Social History   Social History Narrative  . Not on file    Objective: Vital Signs: BP (!) 152/84 (BP Location: Left Arm, Patient Position: Sitting, Cuff Size: Normal)   Pulse 81   Resp 13   Ht 5\' 5"  (1.651 m)   Wt 212 lb (96.2 kg)   BMI 35.28 kg/m    Physical Exam  Constitutional: She is oriented to person, place, and time. She appears well-developed and well-nourished.  HENT:  Head: Normocephalic and atraumatic.  Eyes: Conjunctivae and EOM are normal.  Neck: Normal range of motion.  Cardiovascular: Normal rate,  regular rhythm, normal heart sounds and intact distal pulses.  Pulmonary/Chest: Effort normal and breath sounds normal.  Abdominal: Soft. Bowel sounds are normal.  Lymphadenopathy:    She has no cervical adenopathy.  Neurological: She is alert and oriented to person, place, and time.  Skin: Skin is warm and dry. Capillary refill takes less than 2 seconds.  Psychiatric: She has a normal mood and affect. Her behavior is normal.  Nursing note and vitals reviewed.    Musculoskeletal Exam: C-spine thoracic lumbar spine good range of motion.  She had painful range of motion of her right shoulder joint and tenderness on palpation over right glenohumeral joint.  No effusion was noted.  Left shoulder joint was in good range of motion.  Elbow joints wrist joint MCPs PIPs DIPs were in good range of motion.  She has some DIP PIP thickening of her hands consistent with osteoarthritis.  Hip joints knee joints ankles MTPs PIPs were in good range of motion with no synovitis.  CDAI Exam: CDAI Score: Not documented Patient Global  Assessment: Not documented; Provider Global Assessment: Not documented Swollen: Not documented; Tender: Not documented Joint Exam   Not documented   There is currently no information documented on the homunculus. Go to the Rheumatology activity and complete the homunculus joint exam.  Investigation: No additional findings.  Imaging: No results found.  Recent Labs: Lab Results  Component Value Date   WBC 3.7 (L) 09/08/2018   HGB 15.0 09/08/2018   PLT 271 09/08/2018   NA 143 09/08/2018   K 4.1 09/08/2018   CL 106 09/08/2018   CO2 28 09/08/2018   GLUCOSE 90 09/08/2018   BUN 13 09/08/2018   CREATININE 0.92 09/08/2018   BILITOT 0.5 09/08/2018   ALKPHOS 128 (H) 12/12/2017   AST 16 09/08/2018   ALT 11 09/08/2018   PROT 7.7 09/08/2018   ALBUMIN 4.5 12/12/2017   CALCIUM 9.4 09/08/2018   GFRAA 73 09/08/2018  uric acid 6.8  Speciality Comments: No specialty comments  available.  Procedures:  Large Joint Inj: R glenohumeral on 10/30/2018 3:36 PM Indications: pain Details: 27 G 1.5 in needle, posterior approach  Arthrogram: No  Medications: 1 mL lidocaine 1 %; 40 mg triamcinolone acetonide 40 MG/ML Aspirate: 0 mL Outcome: tolerated well, no immediate complications Procedure, treatment alternatives, risks and benefits explained, specific risks discussed. Consent was given by the patient. Immediately prior to procedure a time out was called to verify the correct patient, procedure, equipment, support staff and site/side marked as required. Patient was prepped and draped in the usual sterile fashion.     Allergies: Codeine   Assessment / Plan:     Visit Diagnoses: Acute pain of right shoulder-she has been having pain and discomfort in her right shoulder joint for the last 2 days without relief.  After informed consent was obtained and different treatment options were discussed right shoulder joint was injected with cortisone as described above.  She tolerated the procedure well.  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  She had discontinued Uloric in March 2019.  Her uric acid was: 09/08/2018 6.8.  She was placed back on allopurinol today starting at 100 mg p.o. daily along with colchicine 0.6 mg p.o. daily.  We will check her labs in 1 month.  If her uric acid is below 6.0 we may be able to discontinue colchicine.  E modifications were also discussed at length.  Medication monitoring encounter  Hyperuricemia-history of hyperuricemia.  She is doing some dietary modifications.  History of vitamin D deficiency-she is on supplements.  Other medical arms are listed as follows:  OSA on CPAP  History of obesity  History of coronary artery disease  History of hypertension  History of hypothyroidism  History of atrial fibrillation  History of hyperlipidemia   Orders: Orders Placed This Encounter    Procedures  . Large Joint Inj: R glenohumeral  . CBC with Differential/Platelet  . COMPLETE METABOLIC PANEL WITH GFR  . Uric acid   Meds ordered this encounter  Medications  . colchicine 0.6 MG tablet    Sig: Take 1 tablet daily.  May take extra tablet for gout flare.    Dispense:  60 tablet    Refill:  1  . allopurinol (ZYLOPRIM) 100 MG tablet    Sig: Take 1 tablet (100 mg total) by mouth daily.    Dispense:  30 tablet    Refill:  2    .  Follow-Up Instructions: Return in about 4 months (around 02/28/2019)  for Gout.   Bo Merino, MD  Note - This record has been created using Editor, commissioning.  Chart creation errors have been sought, but may not always  have been located. Such creation errors do not reflect on  the standard of medical care.

## 2018-11-26 DIAGNOSIS — G4733 Obstructive sleep apnea (adult) (pediatric): Secondary | ICD-10-CM | POA: Diagnosis not present

## 2018-12-02 ENCOUNTER — Other Ambulatory Visit: Payer: Self-pay | Admitting: Family Medicine

## 2018-12-19 ENCOUNTER — Other Ambulatory Visit: Payer: Self-pay | Admitting: *Deleted

## 2018-12-19 DIAGNOSIS — Z79899 Other long term (current) drug therapy: Secondary | ICD-10-CM

## 2018-12-19 DIAGNOSIS — M1A09X0 Idiopathic chronic gout, multiple sites, without tophus (tophi): Secondary | ICD-10-CM

## 2018-12-20 LAB — CBC WITH DIFFERENTIAL/PLATELET
Absolute Monocytes: 388 cells/uL (ref 200–950)
BASOS ABS: 28 {cells}/uL (ref 0–200)
Basophils Relative: 0.7 %
EOS PCT: 2 %
Eosinophils Absolute: 80 cells/uL (ref 15–500)
HCT: 44.7 % (ref 35.0–45.0)
Hemoglobin: 15 g/dL (ref 11.7–15.5)
Lymphs Abs: 1104 cells/uL (ref 850–3900)
MCH: 30.1 pg (ref 27.0–33.0)
MCHC: 33.6 g/dL (ref 32.0–36.0)
MCV: 89.8 fL (ref 80.0–100.0)
MPV: 9 fL (ref 7.5–12.5)
Monocytes Relative: 9.7 %
Neutro Abs: 2400 cells/uL (ref 1500–7800)
Neutrophils Relative %: 60 %
Platelets: 281 10*3/uL (ref 140–400)
RBC: 4.98 10*6/uL (ref 3.80–5.10)
RDW: 14.2 % (ref 11.0–15.0)
Total Lymphocyte: 27.6 %
WBC: 4 10*3/uL (ref 3.8–10.8)

## 2018-12-20 LAB — COMPLETE METABOLIC PANEL WITH GFR
AG Ratio: 1.4 (calc) (ref 1.0–2.5)
ALT: 15 U/L (ref 6–29)
AST: 18 U/L (ref 10–35)
Albumin: 4.2 g/dL (ref 3.6–5.1)
Alkaline phosphatase (APISO): 96 U/L (ref 33–130)
BUN/Creatinine Ratio: 11 (calc) (ref 6–22)
BUN: 11 mg/dL (ref 7–25)
CALCIUM: 9.4 mg/dL (ref 8.6–10.4)
CO2: 29 mmol/L (ref 20–32)
Chloride: 105 mmol/L (ref 98–110)
Creat: 0.99 mg/dL — ABNORMAL HIGH (ref 0.60–0.93)
GFR, EST NON AFRICAN AMERICAN: 58 mL/min/{1.73_m2} — AB (ref >=60)
GFR, Est African American: 67 mL/min/{1.73_m2} (ref >=60)
Globulin: 3.1 g/dL (calc) (ref 1.9–3.7)
Glucose, Bld: 73 mg/dL (ref 65–99)
Potassium: 4.5 mmol/L (ref 3.5–5.3)
Sodium: 143 mmol/L (ref 135–146)
Total Bilirubin: 0.6 mg/dL (ref 0.2–1.2)
Total Protein: 7.3 g/dL (ref 6.1–8.1)

## 2018-12-20 LAB — URIC ACID: Uric Acid, Serum: 6.6 mg/dL (ref 2.5–7.0)

## 2018-12-22 NOTE — Progress Notes (Signed)
Uric acid is within desirable range.

## 2019-01-13 NOTE — Progress Notes (Signed)
Claudia Cantrell is a 73 y.o. female who presents for annual wellness visit, CPE and follow-up on chronic medical conditions.  She has the following concerns:  Reports feeling good.  She is very active and denies having any shortness of breath or joint pains as she has in the past.  Persistent proteinuria-most likely related to hypertension.  SPEP in the past was normal.  Hypothyroidism with a thyroidectomy-reports good daily compliance with Synthroid and no side effects.  History of prediabetes- last Hgb A1c 5.6% in May 2019   On Eliquis for A-fib.  Denies bleeding.  HTN- has not taken her medication since yesterday morning and forgot to take the amlodipine yesterday. BP at home is mostly in 120s/70s  Hyperlipidemia-taking Crestor daily without any issues.  Vitamin D deficiency-reports taking 2000 international units daily.   Immunization History  Administered Date(s) Administered  . Influenza Nasal 11/25/2013  . Influenza, High Dose Seasonal PF 12/30/2014, 10/14/2017, 11/01/2018  . Influenza,inj,Quad PF,6+ Mos 11/25/2013  . Influenza-Unspecified 12/30/2012, 11/09/2015, 12/24/2016  . Pneumococcal Conjugate-13 12/30/2014, 10/14/2017  . Pneumococcal Polysaccharide-23 11/25/2013  . Zoster 12/15/2008  . Zoster Recombinat (Shingrix) 10/14/2017, 01/21/2018   Last Pap smear: hysterectomy Last mammogram: August 2019 Last colonoscopy: overdue. Will call to schedule  Last DEXA: May 2017 and normal  Dentist: years ago- partials  Ophtho: overdue  Exercise: very active   Stopped smoking in 2021. Smoked for 10 years.   Other doctors caring for patient include: Dr. Kathi Ludwig- rheumatology Dr. Claiborne Billings- cardiology [facility]- 5 years ago Dr. Garwin Brothers- OB/GYN- been a while since hysterectomy   Depression screen:  See questionnaire below.  Depression screen [facility] 2/9 01/14/2019 01/13/2018 12/11/2017 08/29/2016  Decreased Interest 0 0 0 0  Down, Depressed, Hopeless 1 0 0 0  PHQ - 2 Score 1 0 0 0     Fall Risk Screen: see questionnaire below. Fall Risk  01/13/2018 08/29/2016  Falls in the past year? No No    ADL screen:  See questionnaire below Functional Status Survey: Is the patient deaf or have difficulty hearing?: No Does the patient have difficulty seeing, even when wearing glasses/contacts?: No Does the patient have difficulty concentrating, remembering, or making decisions?: Yes(memory wanders) Does the patient have difficulty walking or climbing stairs?: No Does the patient have difficulty dressing or bathing?: No Does the patient have difficulty doing errands alone such as visiting a doctor's office or shopping?: No   End of Life Discussion:  Patient Does not have a living will and medical power of attorney.  We discussed this again this year and paperwork was given for her to take home.  We reviewed and both signed the MOST form, no changes.  Review of Systems Constitutional: -fever, -chills, -sweats, -unexpected weight change, -anorexia, -fatigue Allergy: -sneezing, -itching, -congestion Dermatology: denies changing moles, rash, lumps, new worrisome lesions ENT: -runny nose, -ear pain, -sore throat, -hoarseness, -sinus pain, -teeth pain, -tinnitus, -hearing loss, -epistaxis Cardiology:  -chest pain, -palpitations, -edema, -orthopnea, -paroxysmal nocturnal dyspnea Respiratory: -cough, -shortness of breath, -dyspnea on exertion, -wheezing, -hemoptysis Gastroenterology: -abdominal pain, -nausea, -vomiting, -diarrhea, -constipation, -blood in stool, -changes in bowel movement, -dysphagia Hematology: -bleeding or bruising problems Musculoskeletal: -arthralgias, -myalgias, -joint swelling, -back pain, -neck pain, -cramping, -gait changes Ophthalmology: -vision changes, -eye redness, -itching, -discharge Urology: -dysuria, -difficulty urinating, -hematuria, -urinary frequency, -urgency, incontinence Neurology: -headache, -weakness, -tingling, -numbness, -speech abnormality,  -memory loss, -falls, -dizziness Psychology:  -depressed mood, -agitation, -sleep problems    PHYSICAL EXAM:  BP 140/90   Pulse Marland Kitchen)  55   Ht 5' 3.5" (1.613 m)   Wt 207 lb 9.6 oz (94.2 kg)   BMI 36.20 kg/m   General Appearance: Alert, cooperative, no distress, appears stated age Head: Normocephalic, without obvious abnormality, atraumatic Eyes: PERRL, conjunctiva/corneas clear, EOM's intact, fundi benign Ears: Normal TM's and external ear canals Nose: Nares normal, mucosa normal, no drainage or sinus   tenderness Throat: Lips, mucosa, and tongue normal; teeth and gums normal Neck: Supple, no lymphadenopathy; thyroid not palpable, post thyroidectomy. no carotid bruit or JVD Back: Spine nontender, no curvature, ROM normal, no CVA tenderness Lungs: Clear to auscultation bilaterally without wheezes, rales or ronchi; respirations unlabored Chest Wall: No tenderness or deformity Heart: Regular rate and rhythm, S1 and S2 normal, no murmur, rub or gallop Breast Exam: Declines.  Mammogram up-to-date. Abdomen: Soft, non-tender, nondistended, normoactive bowel sounds, no masses, no hepatosplenomegaly Genitalia: Declines Extremities: No clubbing, cyanosis or edema Pulses: 2+ and symmetric all extremities Skin: Skin color, texture, turgor normal, no rashes or lesions Lymph nodes: Cervical, supraclavicular, and axillary nodes normal Neurologic: CNII-XII intact, normal strength, sensation and gait; reflexes 2+ and symmetric throughout Psych: Normal mood, affect, hygiene and grooming.  ASSESSMENT/PLAN: Medicare annual wellness visit, subsequent - Plan: POCT Urinalysis DIP (Proadvantage Device)  Essential hypertension, benign - Plan: CBC with Differential/Platelet, Comprehensive metabolic panel, Ambulatory referral to Nephrology  Coronary artery disease involving native coronary artery of native heart without angina pectoris  OSA on CPAP  Pure hypercholesterolemia - Plan: Lipid  panel  Vitamin D deficiency - Plan: VITAMIN D 25 Hydroxy (Vit-D Deficiency, Fractures)  Advance directive discussed with patient  Chronic anticoagulation  Persistent proteinuria - Plan: Ambulatory referral to Nephrology  Chronic kidney disease, unspecified CKD stage - Plan: Ambulatory referral to Nephrology  Microalbuminuria - Plan: Microalbumin / creatinine urine ratio  Postoperative hypothyroidism - Plan: TSH  Medication management - Plan: TSH  Prediabetes - Plan: Hemoglobin A1c  Routine general medical examination at a health care facility - Plan: CBC with Differential/Platelet, Comprehensive metabolic panel, TSH, Lipid panel  She is a pleasant 73 year old female who is here for Medicare AWV, fasting CPE as well as to follow-up on chronic health conditions. She sees multiple specialists. Congratulated her on taking such good care of herself.  She continues to intentionally lose weight by eating a healthy diet and being active. She denies any new concerns today. Hypertension-blood pressure is not quite at goal today however her readings at home have been mostly in goal range.  She will continue her current medication regimen for now and keep an eye on her blood pressure.  She will report back if her readings are not consistently in goal range.  Advised to pay close attention to her sodium. Persistent proteinuria.  CKD.  History of microalbuminuria. Recheck urine microalbumin and refer to nephrology. Reports good daily compliance with Synthroid.  She appears to be taking this appropriately.  Check TSH and adjust medication as needed. Prediabetes-check hemoglobin A1c. Taking a daily statin and no side effects.  Check lipid panel. On Eliquis for A. fib and no sign of bleeding.  She is followed by cardiology. OSA on CPAP-reports nightly use and is benefiting from this.  I recommend she continue. She is aware that she is overdue for her colonoscopy.  She will call [facility]. Appears to  be up-to-date on immunizations. Follow up pending labs.    Discussed monthly self breast exams and yearly mammograms; at least 30 minutes of aerobic activity at least 5 days/week and  weight-bearing exercise 2x/week; proper sunscreen use reviewed; healthy diet, including goals of calcium and vitamin D intake and alcohol recommendations (less than or equal to 1 drink/day) reviewed; regular seatbelt use; changing batteries in smoke detectors.  Immunization recommendations discussed.  Colonoscopy recommendations reviewed   Medicare Attestation I have personally reviewed: The patient's medical and social history Their use of alcohol, tobacco or illicit drugs Their current medications and supplements The patient's functional ability including ADLs,fall risks, home safety risks, cognitive, and hearing and visual impairment Diet and physical activities Evidence for depression or mood disorders  The patient's weight, height, and BMI have been recorded in the chart.  I have made referrals, counseling, and provided education to the patient based on review of the above and I have provided the patient with a written personalized care plan for preventive services.     Harland Dingwall, NP-C   01/14/2019

## 2019-01-13 NOTE — Patient Instructions (Addendum)
[name] , Thank you for taking time to come for your Medicare Wellness Visit. I appreciate your ongoing commitment to your health goals. Please review the following plan we discussed and let me know if I can assist you in the future.   Call and schedule with [facility]. [phone]  Continue eating a healthy diet and being active. You are taking good care of yourself.   Keep checking your blood pressure at home and as long as you are seeing most readings less than 130/80 then it is well controlled. If not, let us know or your cardiologist.   You will receive a call from the nephrologist (kidney specialist) in the next couple of weeks.  We will call you with your lab results.    This is a list of the screening recommended for you and due dates:  Health Maintenance  Topic Date Due  . Tetanus Vaccine  07/02/1967  . Colon Cancer Screening  07/01/1998  . Mammogram  08/01/2020  . Flu Shot  Completed  . DEXA scan (bone density measurement)  Completed  .  Hepatitis C: One time screening is recommended by Center for Disease Control  (CDC) for  adults born from [birth year] through 1963.   Completed  . Pneumonia vaccines  Completed    Preventive Care 35 Years and Older, Female Preventive care refers to lifestyle choices and visits with your health care provider that can promote health and wellness. What does preventive care include?  A yearly physical exam. This is also called an annual well check.  Dental exams once or twice a year.  Routine eye exams. Ask your health care provider how often you should have your eyes checked.  Personal lifestyle choices, including: ? Daily care of your teeth and gums. ? Regular physical activity. ? Eating a healthy diet. ? Avoiding tobacco and drug use. ? Limiting alcohol use. ? Practicing safe sex. ? Taking low-dose aspirin every day. ? Taking vitamin and mineral supplements as recommended by your health care provider. What happens during an annual  well check? The services and screenings done by your health care provider during your annual well check will depend on your age, overall health, lifestyle risk factors, and family history of disease. Counseling Your health care provider may ask you questions about your:  Alcohol use.  Tobacco use.  Drug use.  Emotional well-being.  Home and relationship well-being.  Sexual activity.  Eating habits.  History of falls.  Memory and ability to understand (cognition).  Work and work Statistician.  Reproductive health.  Screening You may have the following tests or measurements:  Height, weight, and BMI.  Blood pressure.  Lipid and cholesterol levels. These may be checked every 5 years, or more frequently if you are over 49 years old.  Skin check.  Lung cancer screening. You may have this screening every year starting at age 51 if you have a 30-pack-year history of smoking and currently smoke or have quit within the past 15 years.  Colorectal cancer screening. All adults should have this screening starting at age 53 and continuing until age 64. You will have tests every 1-10 years, depending on your results and the type of screening test. People at increased risk should start screening at an earlier age. Screening tests may include: ? Guaiac-based fecal occult blood testing. ? Fecal immunochemical test (FIT). ? Stool DNA test. ? Virtual colonoscopy. ? Sigmoidoscopy. During this test, a flexible tube with a tiny camera (sigmoidoscope) is used to examine  your rectum and lower colon. The sigmoidoscope is inserted through your anus into your rectum and lower colon. ? Colonoscopy. During this test, a long, thin, flexible tube with a tiny camera (colonoscope) is used to examine your entire colon and rectum.  Hepatitis C blood test.  Hepatitis B blood test.  Sexually transmitted disease (STD) testing.  Diabetes screening. This is done by checking your blood sugar (glucose)  after you have not eaten for a while (fasting). You may have this done every 1-3 years.  Bone density scan. This is done to screen for osteoporosis. You may have this done starting at age 46.  Mammogram. This may be done every 1-2 years. Talk to your health care provider about how often you should have regular mammograms. Talk with your health care provider about your test results, treatment options, and if necessary, the need for more tests. Vaccines Your health care provider may recommend certain vaccines, such as:  Influenza vaccine. This is recommended every year.  Tetanus, diphtheria, and acellular pertussis (Tdap, Td) vaccine. You may need a Td booster every 10 years.  Varicella vaccine. You may need this if you have not been vaccinated.  Zoster vaccine. You may need this after age 54.  Measles, mumps, and rubella (MMR) vaccine. You may need at least one dose of MMR if you were born in 1957 or later. You may also need a second dose.  Pneumococcal 13-valent conjugate (PCV13) vaccine. One dose is recommended after age 48.  Pneumococcal polysaccharide (PPSV23) vaccine. One dose is recommended after age 7.  Meningococcal vaccine. You may need this if you have certain conditions.  Hepatitis A vaccine. You may need this if you have certain conditions or if you travel or work in places where you may be exposed to hepatitis A.  Hepatitis B vaccine. You may need this if you have certain conditions or if you travel or work in places where you may be exposed to hepatitis B.  Haemophilus influenzae type b (Hib) vaccine. You may need this if you have certain conditions. Talk to your health care provider about which screenings and vaccines you need and how often you need them. This information is not intended to replace advice given to you by your health care provider. Make sure you discuss any questions you have with your health care provider. Document Released: 12/23/2015 Document Revised:  01/16/2018 Document Reviewed: 09/27/2015 Elsevier Interactive Patient Education  2019 [employer].

## 2019-01-14 ENCOUNTER — Encounter: Payer: Self-pay | Admitting: Family Medicine

## 2019-01-14 ENCOUNTER — Ambulatory Visit (INDEPENDENT_AMBULATORY_CARE_PROVIDER_SITE_OTHER): Payer: PPO | Admitting: Family Medicine

## 2019-01-14 VITALS — BP 140/90 | HR 55 | Ht 63.5 in | Wt 207.6 lb

## 2019-01-14 DIAGNOSIS — Z0000 Encounter for general adult medical examination without abnormal findings: Secondary | ICD-10-CM | POA: Diagnosis not present

## 2019-01-14 DIAGNOSIS — Z7189 Other specified counseling: Secondary | ICD-10-CM | POA: Diagnosis not present

## 2019-01-14 DIAGNOSIS — R7303 Prediabetes: Secondary | ICD-10-CM | POA: Diagnosis not present

## 2019-01-14 DIAGNOSIS — Z79899 Other long term (current) drug therapy: Secondary | ICD-10-CM | POA: Diagnosis not present

## 2019-01-14 DIAGNOSIS — N189 Chronic kidney disease, unspecified: Secondary | ICD-10-CM

## 2019-01-14 DIAGNOSIS — I10 Essential (primary) hypertension: Secondary | ICD-10-CM | POA: Diagnosis not present

## 2019-01-14 DIAGNOSIS — E7800 Pure hypercholesterolemia, unspecified: Secondary | ICD-10-CM

## 2019-01-14 DIAGNOSIS — Z789 Other specified health status: Secondary | ICD-10-CM | POA: Insufficient documentation

## 2019-01-14 DIAGNOSIS — E559 Vitamin D deficiency, unspecified: Secondary | ICD-10-CM | POA: Diagnosis not present

## 2019-01-14 DIAGNOSIS — R801 Persistent proteinuria, unspecified: Secondary | ICD-10-CM | POA: Diagnosis not present

## 2019-01-14 DIAGNOSIS — G4733 Obstructive sleep apnea (adult) (pediatric): Secondary | ICD-10-CM

## 2019-01-14 DIAGNOSIS — N182 Chronic kidney disease, stage 2 (mild): Secondary | ICD-10-CM | POA: Insufficient documentation

## 2019-01-14 DIAGNOSIS — Z9989 Dependence on other enabling machines and devices: Secondary | ICD-10-CM

## 2019-01-14 DIAGNOSIS — Z7901 Long term (current) use of anticoagulants: Secondary | ICD-10-CM

## 2019-01-14 DIAGNOSIS — R809 Proteinuria, unspecified: Secondary | ICD-10-CM

## 2019-01-14 DIAGNOSIS — I2510 Atherosclerotic heart disease of native coronary artery without angina pectoris: Secondary | ICD-10-CM

## 2019-01-14 DIAGNOSIS — E890 Postprocedural hypothyroidism: Secondary | ICD-10-CM | POA: Diagnosis not present

## 2019-01-14 LAB — POCT URINALYSIS DIP (PROADVANTAGE DEVICE)
Bilirubin, UA: NEGATIVE
Blood, UA: NEGATIVE
Glucose, UA: NEGATIVE mg/dL
Ketones, POC UA: NEGATIVE mg/dL
Leukocytes, UA: NEGATIVE
Nitrite, UA: NEGATIVE
PH UA: 6 (ref 5.0–8.0)
Protein Ur, POC: 30 mg/dL — AB
Specific Gravity, Urine: 1.02
Urobilinogen, Ur: NEGATIVE

## 2019-01-15 LAB — CBC WITH DIFFERENTIAL/PLATELET
BASOS ABS: 0 10*3/uL (ref 0.0–0.2)
Basos: 1 %
EOS (ABSOLUTE): 0.1 10*3/uL (ref 0.0–0.4)
Eos: 3 %
Hematocrit: 46.6 % (ref 34.0–46.6)
Hemoglobin: 14.9 g/dL (ref 11.1–15.9)
Immature Grans (Abs): 0 10*3/uL (ref 0.0–0.1)
Immature Granulocytes: 0 %
LYMPHS: 33 %
Lymphocytes Absolute: 1.1 10*3/uL (ref 0.7–3.1)
MCH: 29.6 pg (ref 26.6–33.0)
MCHC: 32 g/dL (ref 31.5–35.7)
MCV: 93 fL (ref 79–97)
Monocytes Absolute: 0.3 10*3/uL (ref 0.1–0.9)
Monocytes: 9 %
Neutrophils Absolute: 1.8 10*3/uL (ref 1.4–7.0)
Neutrophils: 54 %
Platelets: 265 10*3/uL (ref 150–450)
RBC: 5.03 x10E6/uL (ref 3.77–5.28)
RDW: 14.4 % (ref 11.7–15.4)
WBC: 3.4 10*3/uL (ref 3.4–10.8)

## 2019-01-15 LAB — COMPREHENSIVE METABOLIC PANEL
ALT: 15 IU/L (ref 0–32)
AST: 16 IU/L (ref 0–40)
Albumin/Globulin Ratio: 1.5 (ref 1.2–2.2)
Albumin: 4.4 g/dL (ref 3.8–4.8)
Alkaline Phosphatase: 104 IU/L (ref 39–117)
BILIRUBIN TOTAL: 0.5 mg/dL (ref 0.0–1.2)
BUN/Creatinine Ratio: 10 — ABNORMAL LOW (ref 12–28)
BUN: 10 mg/dL (ref 8–27)
CHLORIDE: 105 mmol/L (ref 96–106)
CO2: 23 mmol/L (ref 20–29)
Calcium: 9.7 mg/dL (ref 8.7–10.3)
Creatinine, Ser: 1.02 mg/dL — ABNORMAL HIGH (ref 0.57–1.00)
GFR calc non Af Amer: 56 mL/min/{1.73_m2} — ABNORMAL LOW (ref >59)
GFR, EST AFRICAN AMERICAN: 64 mL/min/{1.73_m2} (ref >59)
GLUCOSE: 86 mg/dL (ref 65–99)
Globulin, Total: 2.9 g/dL (ref 1.5–4.5)
Potassium: 4.2 mmol/L (ref 3.5–5.2)
Sodium: 142 mmol/L (ref 134–144)
Total Protein: 7.3 g/dL (ref 6.0–8.5)

## 2019-01-15 LAB — LIPID PANEL
CHOL/HDL RATIO: 3 ratio (ref 0.0–4.4)
Cholesterol, Total: 148 mg/dL (ref 100–199)
HDL: 50 mg/dL (ref >39)
LDL Calculated: 78 mg/dL (ref 0–99)
Triglycerides: 99 mg/dL (ref 0–149)
VLDL Cholesterol Cal: 20 mg/dL (ref 5–40)

## 2019-01-15 LAB — MICROALBUMIN / CREATININE URINE RATIO
Creatinine, Urine: 178.6 mg/dL
Microalb/Creat Ratio: 41 mg/g creat — ABNORMAL HIGH (ref 0–29)
Microalbumin, Urine: 73.9 ug/mL

## 2019-01-15 LAB — HEMOGLOBIN A1C
Est. average glucose Bld gHb Est-mCnc: 123 mg/dL
Hgb A1c MFr Bld: 5.9 % — ABNORMAL HIGH (ref 4.8–5.6)

## 2019-01-15 LAB — TSH: TSH: 1.13 u[IU]/mL (ref 0.450–4.500)

## 2019-01-15 LAB — VITAMIN D 25 HYDROXY (VIT D DEFICIENCY, FRACTURES): Vit D, 25-Hydroxy: 39.6 ng/mL (ref 30.0–100.0)

## 2019-02-03 ENCOUNTER — Other Ambulatory Visit: Payer: Self-pay | Admitting: Cardiovascular Disease

## 2019-02-09 ENCOUNTER — Telehealth: Payer: Self-pay | Admitting: Cardiovascular Disease

## 2019-02-09 NOTE — Telephone Encounter (Signed)
spoke to patient. She has not contact primary -  "I figure she would tell me to see the heart doctor". patient states this has been occurring for about week or more. She does not feel heart being irregular or fluttering, but feels like it increases in rate with activity and shortness of breathe apoinment schedule for wed 02/11/19 at 10:40 am with dr Claiborne Billings.patient aware if symptoms become worse got ER

## 2019-02-09 NOTE — Telephone Encounter (Signed)
New Message   Pt c/o Shortness Of Breath: STAT if SOB developed within the last 24 hours or pt is noticeably SOB on the phone  1. Are you currently SOB (can you hear that pt is SOB on the phone)? PT says it is on and off when she starts walking around or doing activity   2. How long have you been experiencing SOB? Started last Wednesday   3. Are you SOB when sitting or when up moving around? Moving around, PT says she will have to sit down after doing things  4. Are you currently experiencing any other symptoms? No, PT says it does feel like her HR has increased    PT says she takes Eliquis and she I wondering if taking her vitamins could be affecting her

## 2019-02-11 ENCOUNTER — Ambulatory Visit (INDEPENDENT_AMBULATORY_CARE_PROVIDER_SITE_OTHER): Payer: PPO | Admitting: Cardiovascular Disease

## 2019-02-11 ENCOUNTER — Encounter: Payer: Self-pay | Admitting: Cardiovascular Disease

## 2019-02-11 VITALS — BP 142/98 | HR 101 | Ht 65.0 in | Wt 208.6 lb

## 2019-02-11 DIAGNOSIS — E039 Hypothyroidism, unspecified: Secondary | ICD-10-CM

## 2019-02-11 DIAGNOSIS — Z6834 Body mass index (BMI) 34.0-34.9, adult: Secondary | ICD-10-CM

## 2019-02-11 DIAGNOSIS — I480 Paroxysmal atrial fibrillation: Secondary | ICD-10-CM | POA: Diagnosis not present

## 2019-02-11 DIAGNOSIS — I10 Essential (primary) hypertension: Secondary | ICD-10-CM | POA: Diagnosis not present

## 2019-02-11 DIAGNOSIS — Z7901 Long term (current) use of anticoagulants: Secondary | ICD-10-CM | POA: Diagnosis not present

## 2019-02-11 DIAGNOSIS — E785 Hyperlipidemia, unspecified: Secondary | ICD-10-CM | POA: Diagnosis not present

## 2019-02-11 DIAGNOSIS — E6609 Other obesity due to excess calories: Secondary | ICD-10-CM | POA: Diagnosis not present

## 2019-02-11 DIAGNOSIS — I2510 Atherosclerotic heart disease of native coronary artery without angina pectoris: Secondary | ICD-10-CM

## 2019-02-11 DIAGNOSIS — G4733 Obstructive sleep apnea (adult) (pediatric): Secondary | ICD-10-CM

## 2019-02-11 LAB — COMPREHENSIVE METABOLIC PANEL
ALT: 35 IU/L — ABNORMAL HIGH (ref 0–32)
AST: 28 IU/L (ref 0–40)
Albumin/Globulin Ratio: 1.6 (ref 1.2–2.2)
Albumin: 4.4 g/dL (ref 3.8–4.8)
Alkaline Phosphatase: 105 IU/L (ref 39–117)
BUN/Creatinine Ratio: 14 (ref 12–28)
BUN: 15 mg/dL (ref 8–27)
Bilirubin Total: 0.7 mg/dL (ref 0.0–1.2)
CALCIUM: 9.6 mg/dL (ref 8.7–10.3)
CO2: 22 mmol/L (ref 20–29)
Chloride: 107 mmol/L — ABNORMAL HIGH (ref 96–106)
Creatinine, Ser: 1.11 mg/dL — ABNORMAL HIGH (ref 0.57–1.00)
GFR calc Af Amer: 58 mL/min/{1.73_m2} — ABNORMAL LOW (ref >59)
GFR calc non Af Amer: 50 mL/min/{1.73_m2} — ABNORMAL LOW (ref >59)
Globulin, Total: 2.7 g/dL (ref 1.5–4.5)
Glucose: 93 mg/dL (ref 65–99)
Potassium: 4.5 mmol/L (ref 3.5–5.2)
Sodium: 143 mmol/L (ref 134–144)
Total Protein: 7.1 g/dL (ref 6.0–8.5)

## 2019-02-11 LAB — MAGNESIUM: Magnesium: 2.4 mg/dL — ABNORMAL HIGH (ref 1.6–2.3)

## 2019-02-11 MED ORDER — METOPROLOL SUCCINATE ER 50 MG PO TB24: ORAL_TABLET | ORAL | 1 refills | Status: DC

## 2019-02-11 NOTE — Patient Instructions (Addendum)
Medication Instructions:  Take 100 mg Toprol TODAY Starting tomorrow start 75 mg of Toprol   If you need a refill on your cardiac medications before your next appointment, please call your pharmacy.   LAB: BMET, MAG today.  Follow-Up: At [facility], you and your health needs are our priority.  As part of our continuing mission to provide you with exceptional heart care, we have created designated Provider Care Teams.  These Care Teams include your primary Cardiologist (physician) and Advanced Practice Providers (APPs -  Physician Assistants and Nurse Practitioners) who all work together to provide you with the care you need, when you need it. You will need a follow up appointment in 3 weeks.  You may see Shelva Majestic, MD or one of the following Advanced Practice Providers on your designated Care Team: [place], [place] . Fabian Sharp, PA-C

## 2019-02-11 NOTE — Progress Notes (Signed)
Patient ID: KEIMYA BRIDDELL, female   DOB: 1945-12-12, 73 y.o.   MRN: [id number]     HPI: JAIDEN Cantrell is a 73 y.o. female who presents to the office today for an 8 month follow-up evaluation.  Claudia Cantrell suffered remote myocardial infarctions in 1991 and 1992 and at that time underwent PTCA by Dr. Lamount Cohen.  She has a history of hypertension, hyperlipidemia, obstructive sleep apnea on CPAP therapy, obesity, and hypothyroidism with multinodular goiter. In October 2011 she underwent cardiac catheterization after she had experienced episodes of chest pain. Catheterization showed preserved LV contractility although the was a very small focal area of severe hypocontractility involving the mid inferior wall as well as low posterior lateral wall in this patient status post remote myocardial infarction approximately 20 years ago treated with PTCA. At the time of her catheterization she did not have significant carotid obstructive disease and only had mild 10-20% narrowing at the ostium of the LAD, 20% irregularity in the mid AV groove circumflex, and 20% mid RCA and 20% posterior lateral irregularities. Subsequently, she has done well from a cardiovascular standpoint.  She is a history of a multinodular goiter and in 2014, underwent thyroidectomy and tolerated this well from a cardiovascular standpoint.  Since I saw her, she broke her leg in 2016.  She has noticed some increasing shortness of breath particularly when she eats chocolate.  She has been followed by Dr. Jeanann Lewandowsky, for primary care.  She developed atrial fibrillation this year and was admitted August 2017 with AF with rapid ventricular response.  With a cha2ds2vasc score of 5.  She was started on eliquis..  She underwent successful TEE guided cardioversion with restoration of sinus rhythm.  She saw [name] on 08/08/2016 for hospital follow-up and was doing well.  An echo Doppler study in the setting of AF showed an ejection fraction  of 40-45%.  She underwent a nuclear perfusion study on August 30 117, which was low risk and suggested the possibility of a small basal to mid inferolateral defect.  There was no ischemia.  She has obstructive sleep apnea and has been on CPAP therapy since 2011.  When I saw her in October 2017, her CPAP machine was broken.  She was 100% compliant and on her prior sleep study.  She had been utilizing CPAP at 9 cm water pressure.    She received a new Medical illustrator  Sense 10 AutoSet for her CPAP unit from [employer] care as a DME company.  Two-month of compliance data were reviewed.  11/06/2016 through 12/05/2016 usage stays was 97% and usage greater than 4 hours was 97%.  She was averaging 7 hours and 9 minutes.  AHI was excellent at 0.9 at her 9 cm water pressure.  There was no leak.  She had had issues with intermittent function over new machine, which was under warranty.  This has been followed up with her DME company.   She was seen by [name] in September, October, and November 2018.  She has had issues with lower extremity edema and some chest congestion.  She had been using NyQuil.  Her levothyroxine dose was increased. .  She has been taking amlodipine 5 mg, Toprol-XL 50 mg for hypertension.  Levothyroxine 112 g for hypothyroidism.  She is on eliquis and denies any recent awareness of abnormal heart rhythm and is unaware of any recurrent atrial fibrillation.  She is on rosuvastatin 40 mg for hyperlipidemia.  When I saw her in  January 2019 a download was obtained from 11/16/2017 through 12/15/2017.  This showed 93% of usage stays with average use is at 7 hours and 26 minutes.  She is at 9 cm water pressure.  AHI is excellent at 0.7.  There is some occasional leaking her mask.  During that visit, her blood pressure was elevated and I suggested a slight titration of amlodipine from 5 mg up to 7.5 mg daily and that she continue her current dose of Toprol-XL 50 mg.   I last saw her in April 2019 at which time her  blood pressure was elevated and I added losartan 25 mg to take in addition to her amlodipine and Toprol for more optimal blood pressure control.  She was using CPAP with 100% compliant.  BMI was 37.94 and weight loss as well as exercise was recommended.   I last saw her in July 2019 she was thankful for the time that I spent with her at her office visit discussing the importance of weight loss exercise improve diet.  Over the past several months she  lost 12 pounds.  She felt better and had more energy.  She denied chest pain or shortness of breath.  She had changed her diet.    Since I last saw her, she has continued to feel well.  She has continued to use CPAP with 100% compliance.  She was not aware, however, that she could take her device on an airplane.  Her most recent download from January 11, 2019 through February 09, 2019 for this reason showed 87% of usage days due to the fact that she had flown up to [place] to spend 4 days with her daughter.  At a set pressure of 9 cm, AHI was 3.9.  There was no leak.  She has continued to have an improved diet.  She has continued to be on amlodipine 7.5 mg, Toprol-XL 50 mg and olmesartan 20 mg for hypertension.  She is on rosuvastatin 40 mg for hyperlipidemia.  She continues to take levothyroxine 112 mcg for hypothyroidism.  She presents for follow-up evaluation.  Past Medical History:  Diagnosis Date  . Arthritis    rheumatoid  . CAD (coronary artery disease)    a. 1992 s/p MI and PTCA of unknown vessel;  b. 09/2010 Cath: LM nl, LAD 20p, LCX 3m RCA 152mRPL 20.  . Cancer ([facility] [place]  . Family history of anesthesia complication    daughter has difficulty waking   . GERD (gastroesophageal reflux disease)    occ  . Gout 10/08/2016  . Hyperlipidemia   . Hypertensive heart disease   . [place]ischemic cardiomyopathy ([place]   a. 07/2016 Echo: EF 40-45%, mild LVH, inferior akinesis, moderately dilated left atrium, trivial AI and MR.  . Marland KitchenAF ([place]oxysmal [place]  [place]ibrillation) [place]   a. 07/2016 Admitted w/ AF RVR-->CHA2DS2VASc = 5-->Eliquis;  b. 07/2016 successful TEE/DCCV.  . Marland Kitchenleep apnea    a. Using CPAP.    Past Surgical History:  Procedure Laterality Date  . ABDOMINAL HYSTERECTOMY  1988  . BACK SURGERY  1994  . CARDIAC CATHETERIZATION  1991 AND 1992   PTCA BY DR CHLamount Cohen. CARDIOVERSION N/A 08/01/2016   Procedure: CARDIOVERSION;  Surgeon: BrLelon PerlaMD;  [facility];  Service: Cardiovascular;  Laterality: N/A;  . ORIF ANKLE FRACTURE Right 04/27/2015   Procedure: OPEN REDUCTION INTERNAL FIXATION (ORIF) RIGHT BIMALLEOLAR ANKLE FRACTURE WITH SYNDESMOSIS FIXATION;  Surgeon: NaLeandrew KoyanagiMD;  Location:  [facility] OR;  Service: Orthopedics;  Laterality: Right;  . TEE WITHOUT CARDIOVERSION N/A 08/01/2016   Procedure: TRANSESOPHAGEAL ECHOCARDIOGRAM (TEE);  Surgeon: Lelon Perla, MD;  Location: [facility]COPY;  Service: Cardiovascular;  Laterality: N/A;  . THYROIDECTOMY  11/24/2013   DR Dalbert Batman  . THYROIDECTOMY N/A 11/24/2013   Procedure: TOTAL THYROIDECTOMY;  Surgeon: Adin Hector, MD;  Location: [facility];  Service: General;  Laterality: N/A;  . TRANSTHORACIC ECHOCARDIOGRAM  01/15/2013   EF 55% TO 65%. PROBABLE MILD HYPOKINESIS OF THE INFERIOR MYOCARDIUM. GRADE 1 DIASTOLIC DYSFUNCTION. TRIAL AR.LA IS MILDLY DILATED.    Allergies  Allergen Reactions  . Codeine Anxiety    Current Outpatient Medications  Medication Sig Dispense Refill  . Acetaminophen (TYLENOL EXTRA STRENGTH PO) Take 2 tablets by mouth as needed.    Marland Kitchen amLODipine (NORVASC) 5 MG tablet TAKE 1 & 1/2 (ONE & ONE-HALF) TABLETS BY MOUTH ONCE DAILY 135 tablet 0  . Biotin 10000 MCG TABS Take 1 tablet by mouth.     . Cholecalciferol (VITAMIN D-3 PO) Take 2,000 Units by mouth daily.     Marland Kitchen Cod Liver Oil 1000 MG CAPS Take 1 capsule by mouth.     Arne Cleveland 5 MG TABS tablet TAKE 1 TABLET BY MOUTH TWICE DAILY 180 tablet 1  . Glucosamine-Chondroit-Vit C-Mn (GLUCOSAMINE CHONDR  1500 COMPLX) CAPS Take 1 capsule by mouth.     . metoprolol succinate (TOPROL-XL) 50 MG 24 hr tablet Take 1.5 tablet (75 mg). Take with or immediately following a meal. 90 tablet 1  . olmesartan (BENICAR) 20 MG tablet Take 1 tablet (20 mg total) by mouth daily. (Patient taking differently: Take 20 mg by mouth. Pt takes when she remembers) 90 tablet 3  . Omega 3 1200 MG CAPS Take by mouth.     . Probiotic Product (PROBIOTIC-10) CAPS Take 1 capsule by mouth daily.    . rosuvastatin (CRESTOR) 40 MG tablet Take 1 tablet (40 mg total) by mouth daily. 90 tablet 3  . SYNTHROID 112 MCG tablet TAKE 1 TABLET BY MOUTH ONCE DAILY 30 tablet 2  . vitamin B-12 (CYANOCOBALAMIN) 500 MCG tablet Take 500 mcg by mouth daily.     No current facility-administered medications for this visit.     Social History   Socioeconomic History  . Marital status: Widowed    Spouse name: Not on file  . Number of children: Not on file  . Years of education: Not on file  . Highest education level: Not on file  Occupational History  . Not on file  Social Needs  . Financial resource strain: Not on file  . Food insecurity:    Worry: Not on file    Inability: Not on file  . Transportation needs:    Medical: Not on file    Non-medical: Not on file  Tobacco Use  . Smoking status: Former Smoker    Packs/day: 1.00    Years: 15.00    Pack years: 15.00    Types: Cigarettes    Last attempt to quit: 12/10/1990    Years since quitting: 28.1  . Smokeless tobacco: Never Used  . Tobacco comment: occ alcohol  Substance and Sexual Activity  . Alcohol use: Yes    Comment: very rare  . Drug use: No  . Sexual activity: Not on file  Lifestyle  . Physical activity:    Days per week: Not on file    Minutes per session: Not on file  . Stress: Not on  file  Relationships  . Social connections:    Talks on phone: Not on file    Gets together: Not on file    Attends religious service: Not on file    Active member of club or  organization: Not on file    Attends meetings of clubs or organizations: Not on file    Relationship status: Not on file  . Intimate partner violence:    Fear of current or ex partner: Not on file    Emotionally abused: Not on file    Physically abused: Not on file    Forced sexual activity: Not on file  Other Topics Concern  . Not on file  Social History Narrative  . Not on file    Family History  Problem Relation Age of Onset  . Heart disease Mother   . Sudden death Mother   . Diabetes Father   . Stroke Father   . Bone cancer Sister   . Sudden death Brother   . Melanoma Brother   . Heart disease Brother    Social history is notable that she is widowed and has 2 children plus one adopted child as well as 2 grandchildren. She does not routinely exercise. There is no tobacco or alcohol use.   ROS General: Negative; No fevers, chills, or night sweats;  HEENT: Negative; No changes in vision or hearing, sinus congestion, difficulty swallowing Pulmonary: Negative; No cough, wheezing, shortness of breath, hemoptysis Cardiovascular: No recent chest pain, dyspnea. GI: Negative; No nausea, vomiting, diarrhea, or abdominal pain GU: Negative; No dysuria, hematuria, or difficulty voiding Musculoskeletal: Negative; no myalgias, joint pain, or weakness Hematologic/Oncology: Negative; no easy bruising, bleeding Endocrine: History of multinodular goiter status post thyroidectomy Neuro: Negative; no changes in balance, headaches Skin: Negative; No rashes or skin lesions Psychiatric: Negative; No behavioral problems, depression Sleep: Positive obstructive sleep apnea.  She admits to 100% compliance.  PE BP (!) 142/98   Pulse (!) 101   Ht '5\' 5"'  (1.651 m)   Wt 208 lb 9.6 oz (94.6 kg)   BMI 34.71 kg/m    Repeat blood pressure was 138/90  Wt Readings from Last 3 Encounters:  02/11/19 208 lb 9.6 oz (94.6 kg)  01/14/19 207 lb 9.6 oz (94.2 kg)  10/30/18 212 lb (96.2 kg)   General:  Alert, oriented, no distress.  Skin: normal turgor, no rashes, warm and dry HEENT: Normocephalic, atraumatic. Pupils equal round and reactive to light; sclera anicteric; extraocular muscles intact;  Nose without nasal septal hypertrophy Mouth/Parynx benign; Mallinpatti scale 3 Neck: No JVD, no carotid bruits; normal carotid upstroke Lungs: clear to ausculatation and percussion; no wheezing or rales Chest wall: without tenderness to palpitation Heart: PMI not displaced, irregularly irregular at approximately 100 bpm, s1 s2 normal, 1/6 systolic murmur, no diastolic murmur, no rubs, gallops, thrills, or heaves Abdomen: soft, nontender; no hepatosplenomehaly, BS+; abdominal aorta nontender and not dilated by palpation. Back: no CVA tenderness Pulses 2+ Musculoskeletal: full range of motion, normal strength, no joint deformities Extremities: no clubbing cyanosis or edema, Homan's sign negative  Neurologic: grossly nonfocal; Cranial nerves grossly wnl Psychologic: Normal mood and affect   ECG (independently read by me): Atrial fibrillation at 101 bpm.  Specific T changes.  Q wave in lead III.  July 2019 ECG (independently read by me): Sinus bradycardia 57 bpm.  Low voltage.  Poor R wave progression.  April 2019 ECG (independently read by me): Sinus bradycardia 53 bpm.  Nonspecific T changes.  Normal intervals.  December 16, 2017 ECG (independently read by me): Sinus bradycardia 57 bpm.  Q wave in lead 3.  Poor R wave progression V1 through V3.  October  2017 ECG (independently read by me): Sinus bradycardia 52 bpm.  Poor anterior R-wave progression.  Normal intervals.  October 2014 ECG: Sinus rhythm at 69 beats per minute. Normal intervals. Nonspecific T changes.  LABS:  BMP Latest Ref Rng & Units 02/11/2019 01/14/2019 12/19/2018  Glucose 65 - 99 mg/dL 93 86 73  BUN 8 - 27 mg/dL '15 10 11  ' Creatinine 0.57 - 1.00 mg/dL 1.11(H) 1.02(H) 0.99(H)  BUN/Creat Ratio 12 - 28 14 10(L) 11  Sodium 134 -  144 mmol/L 143 142 143  Potassium 3.5 - 5.2 mmol/L 4.5 4.2 4.5  Chloride 96 - 106 mmol/L 107(H) 105 105  CO2 20 - 29 mmol/L '22 23 29  ' Calcium 8.7 - 10.3 mg/dL 9.6 9.7 9.4   Hepatic Function Latest Ref Rng & Units 02/11/2019 01/14/2019 12/19/2018  Total Protein 6.0 - 8.5 g/dL 7.1 7.3 7.3  Albumin 3.8 - 4.8 g/dL 4.4 4.4 -  AST 0 - 40 IU/L '28 16 18  ' ALT 0 - 32 IU/L 35(H) 15 15  Alk Phosphatase 39 - 117 IU/L 105 104 -  Total Bilirubin 0.0 - 1.2 mg/dL 0.7 0.5 0.6  Bilirubin, Direct 0.00 - 0.40 mg/dL - - -   CBC Latest Ref Rng & Units 01/14/2019 12/19/2018 09/08/2018  WBC 3.4 - 10.8 x10E3/uL 3.4 4.0 3.7(L)  Hemoglobin 11.1 - 15.9 g/dL 14.9 15.0 15.0  Hematocrit 34.0 - 46.6 % 46.6 44.7 46.7(H)  Platelets 150 - 450 x10E3/uL 265 281 271   Lab Results  Component Value Date   MCV 93 01/14/2019   MCV 89.8 12/19/2018   MCV 89.5 09/08/2018   Lab Results  Component Value Date   TSH 1.130 01/14/2019    Lipid Panel     Component Value Date/Time   CHOL 148 01/14/2019 1045   TRIG 99 01/14/2019 1045   HDL 50 01/14/2019 1045   CHOLHDL 3.0 01/14/2019 1045   CHOLHDL 4.5 07/30/2016 0550   VLDL 26 07/30/2016 0550   LDLCALC 78 01/14/2019 1045   IMPRESSION:  1. PAF (paroxysmal atrial fibrillation) ([place])   2. Essential hypertension   3. OSA (obstructive sleep apnea)   4. Hypothyroidism, unspecified type   5. Hyperlipidemia with target LDL less than 70   6. CAD in native artery   7. Class 1 obesity due to excess calories with serious comorbidity and body mass index (BMI) of 34.0 to 34.9 in adult   8. Anticoagulated      ASSESSMENT AND PLAN: Ms. Danniell Rotundo is 73 year-old [place] American female who suffered a remote myocardial infarction  and underwent PTCA by Dr. Sherald Barge 28 years ago.  Cardiac catheterization in 2011 demonstrated a small focal wall motion abnormality in the inferior and posterior lateral wall concordant with his myocardial infarction. She has mild nonobstructive CAD. On her last   nuclear perfusion study  ejection fraction was 55% and she had findings consistent with a small area of scar in the basal and mid inferolateral wall concordant with her left circumflex infarct.  She developed atrial fibrillation in August 2017 and underwent successful TEE cardioversion.  Is only, regarding blood pressure she has been on amlodipine 7.5 mg, metoprolol succinate 50 mg, olmesartan 20 mg.  Her blood pressure today is mildly elevated and her resting pulse is increased.  Her ECG confirms that she is back  in atrial fibrillation of questionable duration.  As result I will further titrate Toprol-XL to 75 mg daily.  She continues to use CPAP and admits to 100 percent compliance.  She recently traveled to [place] and was not aware that she could take her CPAP on the airplane.  I discussed with her that she can do this and she should take it with her on her travel.  With her ECG demonstrating atrial fibrillation, it is possible that she may have reverted back to atrial fibrillation as result of not using her CPAP therapy while in [place].  I reviewed her most recent download.  She has been on a set pressure of 9 cm.  I am electing to change her to an auto mode and will change this to a minimum pressure of 10 with a maximum up to 15 cm of water.  This should provide potential for CPAP adjustment based on body position addition to other factors.  She continues to purposefully lose gradual weight   BMI today is proved but still elevated at 34.71.  Weight loss and exercise was recommended.  She continues to be on rosuvastatin 40 mg for hyperlipidemia.  She is anticoagulated on Eliquis and denies bleeding.  She is on levothyroxine 1 and 12 mcg for hypothyroidism.  I will recheck a be met and magnesium today since she is in atrial fibrillation.  I will see her in 3 weeks for follow-up evaluation.    Time spent: 25 minutes  Troy Sine, MD, [facility]  02/13/2019 12:05 PM

## 2019-02-13 ENCOUNTER — Encounter: Payer: Self-pay | Admitting: Cardiovascular Disease

## 2019-02-26 ENCOUNTER — Ambulatory Visit: Payer: PPO | Admitting: Rheumatology

## 2019-03-03 ENCOUNTER — Other Ambulatory Visit: Payer: Self-pay | Admitting: Family Medicine

## 2019-03-04 ENCOUNTER — Telehealth: Payer: Self-pay | Admitting: Physician Assistant

## 2019-03-04 NOTE — Telephone Encounter (Signed)
Unable to reach the patient, left message. Will attempt to call again tomorrow AM.

## 2019-03-05 ENCOUNTER — Ambulatory Visit: Payer: PPO | Admitting: Physician Assistant

## 2019-03-09 ENCOUNTER — Ambulatory Visit: Payer: PPO | Admitting: Physician Assistant

## 2019-03-23 ENCOUNTER — Other Ambulatory Visit: Payer: Self-pay | Admitting: Physician Assistant

## 2019-03-23 ENCOUNTER — Other Ambulatory Visit: Payer: Self-pay | Admitting: Cardiovascular Disease

## 2019-04-07 DIAGNOSIS — I480 Paroxysmal atrial fibrillation: Secondary | ICD-10-CM | POA: Diagnosis not present

## 2019-04-07 DIAGNOSIS — E559 Vitamin D deficiency, unspecified: Secondary | ICD-10-CM | POA: Diagnosis not present

## 2019-04-07 DIAGNOSIS — N183 Chronic kidney disease, stage 3 (moderate): Secondary | ICD-10-CM | POA: Diagnosis not present

## 2019-04-07 DIAGNOSIS — R809 Proteinuria, unspecified: Secondary | ICD-10-CM | POA: Diagnosis not present

## 2019-04-07 DIAGNOSIS — I129 Hypertensive chronic kidney disease with stage 1 through stage 4 chronic kidney disease, or unspecified chronic kidney disease: Secondary | ICD-10-CM | POA: Diagnosis not present

## 2019-04-07 DIAGNOSIS — R8281 Pyuria: Secondary | ICD-10-CM | POA: Diagnosis not present

## 2019-04-13 DIAGNOSIS — N183 Chronic kidney disease, stage 3 (moderate): Secondary | ICD-10-CM | POA: Diagnosis not present

## 2019-04-14 ENCOUNTER — Other Ambulatory Visit: Payer: Self-pay | Admitting: Nephrology

## 2019-04-14 DIAGNOSIS — N183 Chronic kidney disease, stage 3 unspecified: Secondary | ICD-10-CM

## 2019-04-23 ENCOUNTER — Ambulatory Visit
Admission: RE | Admit: 2019-04-23 | Discharge: 2019-04-23 | Disposition: A | Discharge status: Home / Self Care | Payer: PPO | Source: Ambulatory Visit | Attending: Nephrology | Admitting: Nephrology

## 2019-04-23 DIAGNOSIS — N183 Chronic kidney disease, stage 3 unspecified: Secondary | ICD-10-CM

## 2019-04-29 ENCOUNTER — Other Ambulatory Visit: Payer: Self-pay | Admitting: Nephrology

## 2019-04-29 DIAGNOSIS — N281 Cyst of kidney, acquired: Secondary | ICD-10-CM

## 2019-05-02 ENCOUNTER — Encounter (HOSPITAL_COMMUNITY): Payer: Self-pay

## 2019-05-02 ENCOUNTER — Ambulatory Visit (HOSPITAL_COMMUNITY)
Admission: EM | Admit: 2019-05-02 | Discharge: 2019-05-02 | Disposition: A | Discharge status: Home / Self Care | Payer: PPO | Attending: Family Medicine | Admitting: Family Medicine

## 2019-05-02 ENCOUNTER — Other Ambulatory Visit: Payer: Self-pay

## 2019-05-02 DIAGNOSIS — S60562A Insect bite (nonvenomous) of left hand, initial encounter: Secondary | ICD-10-CM

## 2019-05-02 DIAGNOSIS — W57XXXA Bitten or stung by nonvenomous insect and other nonvenomous arthropods, initial encounter: Secondary | ICD-10-CM | POA: Diagnosis not present

## 2019-05-02 DIAGNOSIS — L03114 Cellulitis of left upper limb: Secondary | ICD-10-CM | POA: Diagnosis not present

## 2019-05-02 MED ORDER — CEPHALEXIN 500 MG PO CAPS: 500.0000 mg | ORAL_CAPSULE | Freq: Two times a day (BID) | ORAL | 0 refills | Status: DC

## 2019-05-02 MED ORDER — METHYLPREDNISOLONE ACETATE 80 MG/ML IJ SUSP
80.0000 mg | Freq: Once | INTRAMUSCULAR | Status: AC
  Administered 2019-05-02: 16:00:00 80 mg via INTRAMUSCULAR

## 2019-05-02 NOTE — ED Provider Notes (Signed)
[place]    CSN: [id number] Arrival date & time: 05/02/19  1529     History   Chief Complaint Chief Complaint  Patient presents with  . Insect Bite    HPI Claudia Cantrell is a 73 y.o. female.    Pt states she was bitten by a bee last night. Pt was stung her left hand. Pt states her hand is throbbing.  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  Patient's grandfather also died after bee sting.       Past Medical History:  Diagnosis Date  . Arthritis    rheumatoid  . CAD (coronary artery disease)    a. 1992 s/p MI and PTCA of unknown vessel;  b. 09/2010 Cath: LM nl, LAD 20p, LCX 66m, RCA 85m, RPL 20.  . Cancer ([place])   . Family history of anesthesia complication    daughter has difficulty waking   . GERD (gastroesophageal reflux disease)    occ  . Gout 10/08/2016  . Hyperlipidemia   . Hypertensive heart disease   . Nonischemic cardiomyopathy ([place])    a. 07/2016 Echo: EF 40-45%, mild LVH, inferior akinesis, moderately dilated left atrium, trivial AI and MR.  Marland Kitchen PAF (paroxysmal atrial fibrillation) ([place])    a. 07/2016 Admitted w/ AF RVR-->CHA2DS2VASc = 5-->Eliquis;  b. 07/2016 successful TEE/DCCV.  Marland Kitchen Sleep apnea    a. Using CPAP.    Patient Active Problem List   Diagnosis Date Noted  . Advance directive discussed with patient 01/14/2019  . Chronic anticoagulation 01/14/2019  . Chronic kidney disease 01/14/2019  . Prediabetes 01/14/2019  . Persistent proteinuria 02/17/2018  . 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 Vitamin D deficiency 03/26/2017  . Medication monitoring encounter 03/26/2017  . Gout 10/08/2016  . PAF (paroxysmal atrial fibrillation) ([place])   . Hypertensive heart disease   . Hyperlipidemia   . CAD (coronary artery disease)   . Nonischemic cardiomyopathy ([place])   . Chest pain with moderate risk for cardiac etiology 08/02/2016  . Atrial fibrillation ([place]) 07/29/2016  . Closed right ankle  fracture 04/24/2015  . Ankle fracture, bimalleolar, closed 04/24/2015  . 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 CAD in native artery 10/09/2013  . OSA on CPAP 10/09/2013  . Essential hypertension, benign 05/27/2013    Past Surgical History:  Procedure Laterality Date  . ABDOMINAL HYSTERECTOMY  1988  . BACK SURGERY  1994  . CARDIAC CATHETERIZATION  1991 AND 1992   PTCA BY DR Lamount Cohen  . CARDIOVERSION N/A 08/01/2016   Procedure: CARDIOVERSION;  Surgeon: Lelon Perla, MD;  Location[facility] ENDOSCOPY;  Service: Cardiovascular;  Laterality: N/A;  . ORIF ANKLE FRACTURE Right 04/27/2015   Procedure: OPEN REDUCTION INTERNAL FIXATION (ORIF) RIGHT BIMALLEOLAR ANKLE FRACTURE WITH SYNDESMOSIS FIXATION;  Surgeon: Leandrew Koyanagi, MD;  Location: [facility];  Service: Orthopedics;  Laterality: Right;  . TEE WITHOUT CARDIOVERSION N/A 08/01/2016   Procedure: TRANSESOPHAGEAL ECHOCARDIOGRAM (TEE);  Surgeon: Lelon Perla, MD;  Location: [facility] ENDOSCOPY;  Service: Cardiovascular;  Laterality: N/A;  . THYROIDECTOMY  11/24/2013   DR Dalbert Batman  . THYROIDECTOMY N/A 11/24/2013   Procedure: TOTAL THYROIDECTOMY;  Surgeon: Adin Hector, MD;  Location: [place];  Service: General;  Laterality: N/A;  . TRANSTHORACIC ECHOCARDIOGRAM  01/15/2013   EF 55% TO 65%. PROBABLE MILD HYPOKINESIS OF THE INFERIOR MYOCARDIUM. GRADE 1 DIASTOLIC DYSFUNCTION. TRIAL AR.LA IS MILDLY DILATED.    OB History   No obstetric history on file.  Home Medications    Prior to Admission medications   Medication Sig Start Date End Date Taking? Authorizing Provider  Acetaminophen (TYLENOL EXTRA STRENGTH PO) Take 2 tablets by mouth as needed.    [provider]  amLODipine (NORVASC) 5 MG tablet TAKE 1 & 1/2 (ONE & ONE-HALF) TABLETS BY MOUTH ONCE DAILY 02/04/19   Troy Sine, MD  Biotin 10000 MCG TABS Take 1 tablet by mouth.     [provider]  cephALEXin (KEFLEX) 500 MG capsule Take 1 capsule (500 mg total) by  mouth 2 (two) times daily. 05/02/19   Robyn Haber, MD  Cholecalciferol (VITAMIN D-3 PO) Take 2,000 Units by mouth daily.     [provider]  Cod Liver Oil 1000 MG CAPS Take 1 capsule by mouth.     [provider]  ELIQUIS 5 MG TABS tablet Take 1 tablet by mouth twice daily 03/23/19   Troy Sine, MD  Glucosamine-Chondroit-Vit C-Mn (GLUCOSAMINE CHONDR 1500 COMPLX) CAPS Take 1 capsule by mouth.     [provider]  metoprolol succinate (TOPROL-XL) 50 MG 24 hr tablet Take 1.5 tablet (75 mg). Take with or immediately following a meal. 02/11/19   Troy Sine, MD  olmesartan (BENICAR) 20 MG tablet Take 1 tablet (20 mg total) by mouth daily. Patient taking differently: Take 20 mg by mouth. Pt takes when she remembers 03/17/18   Troy Sine, MD  Omega 3 1200 MG CAPS Take by mouth.     [provider]  Probiotic Product (PROBIOTIC-10) CAPS Take 1 capsule by mouth daily.    [provider]  rosuvastatin (CRESTOR) 40 MG tablet Take 1 tablet by mouth once daily 03/23/19   Eulas Post [place], [place]  SYNTHROID 112 MCG tablet Take 1 tablet by mouth once daily 03/03/19   Henson, Vickie L, NP-C  vitamin B-12 (CYANOCOBALAMIN) 500 MCG tablet Take 500 mcg by mouth daily.    [provider]    Family History Family History  Problem Relation Age of Onset  . Heart disease Mother   . Sudden death Mother   . Diabetes Father   . Stroke Father   . Bone cancer Sister   . Sudden death Brother   . Melanoma Brother   . Heart disease Brother     Social History Social History   Tobacco Use  . Smoking status: Former Smoker    Packs/day: 1.00    Years: 15.00    Pack years: 15.00    Types: Cigarettes    Last attempt to quit: 12/10/1990    Years since quitting: 28.4  . Smokeless tobacco: Never Used  . Tobacco comment: occ alcohol  Substance Use Topics  . Alcohol use: Yes    Comment: very rare  . Drug use: No     Allergies   Codeine   Review of Systems  Review of Systems  Skin: Positive for rash and wound.  Neurological: Positive for headaches.  All other systems reviewed and are negative.    Physical Exam Triage Vital Signs ED Triage Vitals  Enc Vitals Group     BP 05/02/19 1539 124/86     Pulse Rate 05/02/19 1539 100     Resp 05/02/19 1539 20     Temp 05/02/19 1539 99.9 F (37.7 C)     Temp src --      SpO2 05/02/19 1539 100 %     Weight 05/02/19 1537 206 lb (93.4 kg)     Height --  Head Circumference --      Peak Flow --      Pain Score 05/02/19 1537 9     Pain Loc --      Pain Edu? --      Excl. in [place]? --    No data found.  Updated Vital Signs BP 124/86 (BP Location: Right Arm)   Pulse 100   Temp 99.9 F (37.7 C)   Resp 20   Wt 93.4 kg   SpO2 100%   BMI 34.28 kg/m    Physical Exam Vitals signs and nursing note reviewed.  Constitutional:      Appearance: Normal appearance.  HENT:     Head: Normocephalic.     Mouth/Throat:     Pharynx: Oropharynx is clear.  Neck:     Musculoskeletal: Normal range of motion and neck supple.  Cardiovascular:     Rate and Rhythm: Rhythm irregular.     Heart sounds: No murmur.  Pulmonary:     Effort: Pulmonary effort is normal.     Breath sounds: Normal breath sounds.  Musculoskeletal:        General: Swelling and tenderness present.     Comments: Left hand is grossly swollen  Skin:    General: Skin is warm and dry.     Findings: Erythema present.  Neurological:     General: No focal deficit present.     Mental Status: She is alert.  Psychiatric:        Mood and Affect: Mood normal.      UC Treatments / Results  Labs (all labs ordered are listed, but only abnormal results are displayed) Labs Reviewed - No data to display  EKG None  Radiology No results found.  Procedures Procedures (including critical care time)  Medications Ordered in UC Medications  methylPREDNISolone acetate (DEPO-MEDROL) injection 80 mg (has no administration in time  range)    Initial Impression / Assessment and Plan / UC Course  I have reviewed the triage vital signs and the nursing notes.  Pertinent labs & imaging results that were available during my care of the patient were reviewed by me and considered in my medical decision making (see chart for details).    Final Clinical Impressions(s) / UC Diagnoses   Final diagnoses:  Insect bite of left hand, initial encounter  Cellulitis of left upper extremity   Discharge Instructions   None    ED Prescriptions    Medication Sig Dispense Auth. Provider   cephALEXin (KEFLEX) 500 MG capsule Take 1 capsule (500 mg total) by mouth 2 (two) times daily. 20 capsule Robyn Haber, MD     Controlled Substance Prescriptions [place] Controlled Substance Registry consulted? Not Applicable   Robyn Haber, MD 05/02/19 [phone]

## 2019-05-02 NOTE — ED Triage Notes (Signed)
Pt states she was bitten by a bee last night. Pt was stung her left hand. Pt states her hand is throbbing.

## 2019-05-07 ENCOUNTER — Ambulatory Visit
Admission: RE | Admit: 2019-05-07 | Discharge: 2019-05-07 | Disposition: A | Discharge status: Home / Self Care | Payer: PPO | Source: Ambulatory Visit | Attending: Nephrology | Admitting: Nephrology

## 2019-05-07 DIAGNOSIS — N281 Cyst of kidney, acquired: Secondary | ICD-10-CM

## 2019-05-07 MED ORDER — IOPAMIDOL (ISOVUE-300) INJECTION 61%
100.0000 mL | Freq: Once | INTRAVENOUS | Status: AC | PRN
  Administered 2019-05-07: 10:00:00 100 mL via INTRAVENOUS

## 2019-05-15 ENCOUNTER — Encounter: Payer: Self-pay | Admitting: Gastroenterology

## 2019-06-01 ENCOUNTER — Encounter: Payer: Self-pay | Admitting: Gastroenterology

## 2019-06-03 ENCOUNTER — Ambulatory Visit: Payer: PPO | Admitting: Gastroenterology

## 2019-06-04 ENCOUNTER — Other Ambulatory Visit: Payer: Self-pay | Admitting: Family Medicine

## 2019-06-15 ENCOUNTER — Other Ambulatory Visit: Payer: Self-pay | Admitting: Cardiovascular Disease

## 2019-06-16 ENCOUNTER — Ambulatory Visit: Payer: PPO | Admitting: Gastroenterology

## 2019-06-16 ENCOUNTER — Other Ambulatory Visit: Payer: Self-pay

## 2019-06-16 NOTE — Progress Notes (Signed)
Call patient at 4:05 PM. Left voicemail for call back. We will try and reach out to patient again a little bit later.  Tried to call her again at 4:22 PM. Left voicemail at home phone as well as cell phone for call back. We will update the patient's referring provider to let them know that we were unable to reach the patient we will try and get her scheduled for a true audio/visual media telehealth visit versus in person visit.   Justice Britain, MD [facility] Office # [phone]

## 2019-06-16 NOTE — Progress Notes (Deleted)
[place] VISIT   Primary Care Provider Girtha Rm, NP-C [place] [phone]  Referring Provider Rosita Fire, MD [address] [place],  [place] [address] [phone]  Patient Profile: Claudia Cantrell is a 73 y.o. female with a pmh significant for  The patient presents to the [facility] for an evaluation and management of problem(s) noted below:  Problem List No diagnosis found.  History of Present Illness    The patient does/does not take NSAIDs or BC/Goody Powder. Patient has/has not had an EGD. Patient has/has not had a Colonoscopy.  GI Review of Systems Positive as above Negative for  Pyrosis; Reflux; Regurgitation; Dysphagia; Odynophagia; Globus; Post-prandial cough; Nocturnal cough; Nasal regurgitation; Epigastric pain; Nausea; Vomiting; Hematemesis; Jaundice; Change in Appetite; Early satiety; Abdominal pain; Abdominal bloating; Eructation; Flatulence; Change in BM Frequency; Change in BM Consistency; Constipation; Diarrhea; Incontinence; Urgency; Tenesmus; Hematochezia; Melena  Review of Systems General: Denies fevers/chills/weight loss/night sweats HEENT: Denies oral lesions/sore throat/headaches/visual changes Cardiovascular: Denies chest pain/palpitations Pulmonary: Denies shortness of breath/cough Gastroenterological: See HPI Genitourinary: Denies darkened urine or hematuria Hematological: Denies easy bruising/bleeding Endocrine: Denies temperature intolerance Dermatological: Denies skin changes Psychological: Mood is stable Allergy & Immunology: Denies severe allergic reactions Musculoskeletal: Denies new arthralgias   Medications Current Outpatient Medications  Medication Sig Dispense Refill  . Acetaminophen (TYLENOL EXTRA STRENGTH PO) Take 2 tablets by mouth as needed.    Marland Kitchen amLODipine (NORVASC) 5 MG tablet TAKE 1 & 1/2 (ONE & ONE-HALF) TABLETS BY MOUTH ONCE  DAILY 135 tablet 2  . Biotin 10000 MCG TABS Take 1 tablet by mouth.     . cephALEXin (KEFLEX) 500 MG capsule Take 1 capsule (500 mg total) by mouth 2 (two) times daily. 20 capsule 0  . Cholecalciferol (VITAMIN D-3 PO) Take 2,000 Units by mouth daily.     Marland Kitchen Cod Liver Oil 1000 MG CAPS Take 1 capsule by mouth.     Arne Cleveland 5 MG TABS tablet Take 1 tablet by mouth twice daily 180 tablet 1  . Glucosamine-Chondroit-Vit C-Mn (GLUCOSAMINE CHONDR 1500 COMPLX) CAPS Take 1 capsule by mouth.     . metoprolol succinate (TOPROL-XL) 50 MG 24 hr tablet Take 1.5 tablet (75 mg). Take with or immediately following a meal. 90 tablet 1  . olmesartan (BENICAR) 20 MG tablet Take 1 tablet (20 mg total) by mouth daily. (Patient taking differently: Take 20 mg by mouth. Pt takes when she remembers) 90 tablet 3  . Omega 3 1200 MG CAPS Take by mouth.     . Probiotic Product (PROBIOTIC-10) CAPS Take 1 capsule by mouth daily.    . rosuvastatin (CRESTOR) 40 MG tablet Take 1 tablet by mouth once daily 90 tablet 0  . SYNTHROID 112 MCG tablet Take 1 tablet by mouth once daily 30 tablet 2  . vitamin B-12 (CYANOCOBALAMIN) 500 MCG tablet Take 500 mcg by mouth daily.     No current facility-administered medications for this visit.     Allergies Allergies  Allergen Reactions  . Codeine Anxiety    Histories Past Medical History:  Diagnosis Date  . Arthritis    rheumatoid  . CAD (coronary artery disease)    a. 1992 s/p MI and PTCA of unknown vessel;  b. 09/2010 Cath: LM nl, LAD 20p, LCX 62m, RCA 11m, RPL 20.  . Cancer ([place])   . Family history of anesthesia complication    daughter has difficulty waking   . GERD (gastroesophageal reflux  disease)    occ  . Gout 10/08/2016  . Hyperlipidemia   . Hypertensive heart disease   . Hypothyroidism   . Nonischemic cardiomyopathy ([place])    a. 07/2016 Echo: EF 40-45%, mild LVH, inferior akinesis, moderately dilated left atrium, trivial AI and MR.  Marland Kitchen PAF (paroxysmal atrial  fibrillation) ([place])    a. 07/2016 Admitted w/ AF RVR-->CHA2DS2VASc = 5-->Eliquis;  b. 07/2016 successful TEE/DCCV.  Marland Kitchen Sleep apnea    a. Using CPAP.   Past Surgical History:  Procedure Laterality Date  . ABDOMINAL HYSTERECTOMY  1988  . BACK SURGERY  1994  . CARDIAC CATHETERIZATION  1991 AND 1992   PTCA BY DR Lamount Cohen  . CARDIOVERSION N/A 08/01/2016   Procedure: CARDIOVERSION;  Surgeon: Lelon Perla, MD;  Location[facility] ENDOSCOPY;  Service: Cardiovascular;  Laterality: N/A;  . ORIF ANKLE FRACTURE Right 04/27/2015   Procedure: OPEN REDUCTION INTERNAL FIXATION (ORIF) RIGHT BIMALLEOLAR ANKLE FRACTURE WITH SYNDESMOSIS FIXATION;  Surgeon: Leandrew Koyanagi, MD;  Location: [place];  Service: Orthopedics;  Laterality: Right;  . TEE WITHOUT CARDIOVERSION N/A 08/01/2016   Procedure: TRANSESOPHAGEAL ECHOCARDIOGRAM (TEE);  Surgeon: Lelon Perla, MD;  [facility] ENDOSCOPY;  Service: Cardiovascular;  Laterality: N/A;  . THYROIDECTOMY  11/24/2013   DR Dalbert Batman  . THYROIDECTOMY N/A 11/24/2013   Procedure: TOTAL THYROIDECTOMY;  Surgeon: Adin Hector, MD;  Location: [place];  Service: General;  Laterality: N/A;  . TRANSTHORACIC ECHOCARDIOGRAM  01/15/2013   EF 55% TO 65%. PROBABLE MILD HYPOKINESIS OF THE INFERIOR MYOCARDIUM. GRADE 1 DIASTOLIC DYSFUNCTION. TRIAL AR.LA IS MILDLY DILATED.   Social History   Socioeconomic History  . Marital status: Widowed    Spouse name: Not on file  . Number of children: Not on file  . Years of education: Not on file  . Highest education level: Not on file  Occupational History  . Not on file  Social Needs  . Financial resource strain: Not on file  . Food insecurity    Worry: Not on file    Inability: Not on file  . Transportation needs    Medical: Not on file    Non-medical: Not on file  Tobacco Use  . Smoking status: Former Smoker    Packs/day: 1.00    Years: 15.00    Pack years: 15.00    Types: Cigarettes    Quit date: 12/10/1990    Years since  quitting: 28.5  . Smokeless tobacco: Never Used  . Tobacco comment: occ alcohol  Substance and Sexual Activity  . Alcohol use: Yes    Comment: very rare  . Drug use: No  . Sexual activity: Not on file  Lifestyle  . Physical activity    Days per week: Not on file    Minutes per session: Not on file  . Stress: Not on file  Relationships  . Social Herbalist on phone: Not on file    Gets together: Not on file    Attends religious service: Not on file    Active member of club or organization: Not on file    Attends meetings of clubs or organizations: Not on file    Relationship status: Not on file  . Intimate partner violence    Fear of current or ex partner: Not on file    Emotionally abused: Not on file    Physically abused: Not on file    Forced sexual activity: Not on file  Other Topics Concern  . Not  on file  Social History Narrative  . Not on file   Family History  Problem Relation Age of Onset  . Heart disease Mother   . Sudden death Mother   . Diabetes Father   . Stroke Father   . Bone cancer Sister   . Sudden death Brother   . Melanoma Brother   . Heart disease Brother    I have reviewed her medical, social, and family history in detail and updated the electronic medical record as necessary.    PHYSICAL EXAMINATION  There were no vitals taken for this visit. Wt Readings from Last 3 Encounters:  05/02/19 206 lb (93.4 kg)  02/11/19 208 lb 9.6 oz (94.6 kg)  01/14/19 207 lb 9.6 oz (94.2 kg)   GEN: NAD, appears stated age, doesn't appear chronically ill PSYCH: Cooperative, without pressured speech EYE: Conjunctivae pink, sclerae anicteric ENT: MMM, without oral ulcers, no erythema or exudates noted NECK: Supple CV: RR without R/Gs  RESP: CTAB posteriorly, without wheezing GI: NABS, soft, NT/ND, without rebound or guarding, no HSM appreciated GU: DRE shows MSK/EXT: _ edema, no palmar erythema SKIN: No jaundice, no spider angiomata, no concerning  rashes NEURO:  Alert & Oriented x 3, no focal deficits, no evidence of asterixis   REVIEW OF DATA  I reviewed the following data at the time of this encounter:  GI Procedures and Studies  ***  Laboratory Studies  ***  Imaging Studies  ***   ASSESSMENT  Ms. Kozakiewicz is a 73 y.o. female with a pmh significant for The patient is seen today for evaluation and management of:  No diagnosis found.  ***   PLAN  There are no diagnoses linked to this encounter.   No orders of the defined types were placed in this encounter.   New Prescriptions   No medications on file   Modified Medications   No medications on file    Planned Follow Up No follow-ups on file.   Justice Britain, MD [facility] Office # [phone]

## 2019-06-17 DIAGNOSIS — R933 Abnormal findings on diagnostic imaging of other parts of digestive tract: Secondary | ICD-10-CM | POA: Insufficient documentation

## 2019-07-07 ENCOUNTER — Encounter: Payer: Self-pay | Admitting: Gastroenterology

## 2019-07-10 ENCOUNTER — Telehealth: Payer: Self-pay | Admitting: Family Medicine

## 2019-07-10 NOTE — Telephone Encounter (Signed)
  Pt dropped off handicap form, sent back in folder  Please call pt when ready

## 2019-07-17 ENCOUNTER — Other Ambulatory Visit: Payer: Self-pay | Admitting: Cardiovascular Disease

## 2019-07-17 ENCOUNTER — Telehealth: Payer: Self-pay

## 2019-07-17 ENCOUNTER — Other Ambulatory Visit: Payer: Self-pay | Admitting: Physician Assistant

## 2019-07-17 ENCOUNTER — Encounter: Payer: Self-pay | Admitting: Gastroenterology

## 2019-07-17 ENCOUNTER — Ambulatory Visit (INDEPENDENT_AMBULATORY_CARE_PROVIDER_SITE_OTHER): Payer: PPO | Admitting: Gastroenterology

## 2019-07-17 ENCOUNTER — Encounter (INDEPENDENT_AMBULATORY_CARE_PROVIDER_SITE_OTHER): Payer: Self-pay

## 2019-07-17 VITALS — BP 164/98 | HR 76 | Temp 98.3°F | Ht 64.0 in | Wt 206.5 lb

## 2019-07-17 DIAGNOSIS — R935 Abnormal findings on diagnostic imaging of other abdominal regions, including retroperitoneum: Secondary | ICD-10-CM | POA: Diagnosis not present

## 2019-07-17 DIAGNOSIS — K3189 Other diseases of stomach and duodenum: Secondary | ICD-10-CM | POA: Diagnosis not present

## 2019-07-17 NOTE — Patient Instructions (Addendum)
Your provider has requested that you go to the basement level for lab work on 09/16/19. Press "B" on the elevator. The lab is located at the first door on the left as you exit the elevator.   You have been scheduled for an EUS at [facility] Please follow written instructions given to you at your visit today. If you use inhalers (even only as needed), please bring them with you on the day of your procedure. Your physician has requested that you go to www.[email or web address].com and enter the access code given to you at your visit today. This web site gives a general overview about your procedure. However, you should still follow specific instructions given to you by our office regarding your preparation for the procedure.  Due to recent COVID-19 restrictions implemented by our local and state authorities and in an effort to keep both patients and staff as safe as possible, our hospital system now requires COVID-19 testing prior to any scheduled hospital procedure. Please go to our [facility] location drive thru testing site ([address], [place], [place] [address]) on 08/22/19, any time between 9:30am - 12:45 pm (Wednesday hours are 9:30 am-12 pm and Saturday hours are 9 am-12:30 pm). There will be multiple testing areas, the first checkpoint being for pre-procedure/surgery testing. Get into the right (yellow) lane that leads to the PAT testing team. You will not be billed at the time of testing but may receive a bill later depending on your insurance. The approximate cost of the test is $100. You must agree to quarantine from the time of your testing until the procedure date on 09/02/19 . This should include staying at home with ONLY the people you live with. Avoid take-out, grocery store shopping or leaving the house for any non-emergent reason. Please call our office at [phone] if you have any questions.    Thank you for entrusting me with your care and choosing [facility].  Dr  Rush Landmark

## 2019-07-17 NOTE — Progress Notes (Signed)
[facility] VISIT   Primary Care Provider Girtha Rm, NP-C [place] [address] [phone]  Referring Provider Rosita Fire, MD [address] [place],  [place] [address] [phone]  Patient Profile: Claudia Cantrell is a 73 y.o. female with a pmh significant for paroxysmal atrial fibrillation (on anticoagulation), rheumatoid arthritis, ? Nonischemic cardiomyopathy hypertension, hyperlipidemia, GERD, hypothyroidism, kidney lesion, diverticulosis.  The patient presents to the [facility] for an evaluation and management of problem(s) noted below:  Problem List 1. Submucosal lesion of stomach   2. Abnormal CT of the abdomen     History of Present Illness This is the patient's first visit to the outpatient of our GI clinic.  She was referred after finding on CT scan suggested a lesion abutting or within the stomach mucosa, somewhat exophytic as noted on cross-sectional imaging noted below.  She has not had any significant issues.  She notes daily bowel movements however infrequently will have constipation.  She infrequently has bloating.  She has had intentional weight loss as a result of trying to stay healthy and optimize her health.  She has had dysphagia in the past when she was dealing with her thyroid however after thyroid resection she no longer has dysphagia.  She denies any hematochezia or melena.  No abdominal pain.  The patient has had a prior colonoscopy which was reported as incomplete in 2014.  She may have had an upper endoscopy but that was greater than 15 to 20 years ago per report.  GI Review of Systems Positive as above including dietary pyrosis during indiscretions Negative for odynophagia, nausea, vomiting, change in bowel habits  Review of Systems General: Denies fevers/chills HEENT: Denies oral lesions Cardiovascular: Denies chest pain/palpitations Pulmonary: Denies shortness of  breath/nocturnal cough Gastroenterological: See HPI Genitourinary: Denies darkened urine Hematological: Positive for easy bruising/bleeding as result of anticoagulation Dermatological: Denies jaundice Psychological: Mood is stable   Medications Current Outpatient Medications  Medication Sig Dispense Refill   Acetaminophen (TYLENOL EXTRA STRENGTH PO) Take 2 tablets by mouth as needed.     amLODipine (NORVASC) 5 MG tablet TAKE 1 & 1/2 (ONE & ONE-HALF) TABLETS BY MOUTH ONCE DAILY 135 tablet 2   Biotin 10000 MCG TABS Take 1 tablet by mouth.      Cholecalciferol (VITAMIN D-3 PO) Take 2,000 Units by mouth daily.      Cod Liver Oil 1000 MG CAPS Take 1 capsule by mouth.      ELIQUIS 5 MG TABS tablet Take 1 tablet by mouth twice daily 180 tablet 1   Glucosamine-Chondroit-Vit C-Mn (GLUCOSAMINE CHONDR 1500 COMPLX) CAPS Take 1 capsule by mouth.      metoprolol succinate (TOPROL-XL) 50 MG 24 hr tablet TAKE 1 & 1/2 (ONE & ONE-HALF) TABLETS BY MOUTH ONCE DAILY WITH A MEAL OR  IMMEDIATELY  FOLLOWING  A  MEAL 135 tablet 0   olmesartan (BENICAR) 20 MG tablet Take 1 tablet (20 mg total) by mouth daily. (Patient taking differently: Take 20 mg by mouth. Pt takes when she remembers) 90 tablet 3   Omega 3 1200 MG CAPS Take by mouth.      Probiotic Product (PROBIOTIC-10) CAPS Take 1 capsule by mouth daily.     rosuvastatin (CRESTOR) 40 MG tablet Take 1 tablet by mouth once daily 90 tablet 1   SYNTHROID 112 MCG tablet Take 1 tablet by mouth once daily 30 tablet 2   vitamin B-12 (CYANOCOBALAMIN) 500 MCG tablet Take 500 mcg by  mouth daily.     No current facility-administered medications for this visit.     Allergies Allergies  Allergen Reactions   Codeine Anxiety    Histories Past Medical History:  Diagnosis Date   Arthritis    rheumatoid   CAD (coronary artery disease)    a. 1992 s/p MI and PTCA of unknown vessel;  b. 09/2010 Cath: LM nl, LAD 20p, LCX 66m, RCA 24m, RPL 20.    Cervical cancer ([place]) 1977   Family history of anesthesia complication    daughter has difficulty waking    GERD (gastroesophageal reflux disease)    occ   Gout 10/08/2016   Hyperlipidemia    Hypertensive heart disease    Hypothyroidism    Nonischemic cardiomyopathy ([place])    a. 07/2016 Echo: EF 40-45%, mild LVH, inferior akinesis, moderately dilated left atrium, trivial AI and MR.   PAF (paroxysmal atrial fibrillation) ([place])    a. 07/2016 Admitted w/ AF RVR-->CHA2DS2VASc = 5-->Eliquis;  b. 07/2016 successful TEE/DCCV.   Sleep apnea    a. Using CPAP.   Past Surgical History:  Procedure Laterality Date   [place] AND 1992   PTCA BY DR Billingsley N/A 08/01/2016   Procedure: CARDIOVERSION;  Surgeon: Lelon Perla, MD;  Location: [facility] ENDOSCOPY;  Service: Cardiovascular;  Laterality: N/A;   ORIF ANKLE FRACTURE Right 04/27/2015   Procedure: OPEN REDUCTION INTERNAL FIXATION (ORIF) RIGHT BIMALLEOLAR ANKLE FRACTURE WITH SYNDESMOSIS FIXATION;  Surgeon: Leandrew Koyanagi, MD;  Location: [facility];  Service: Orthopedics;  Laterality: Right;   TEE WITHOUT CARDIOVERSION N/A 08/01/2016   Procedure: TRANSESOPHAGEAL ECHOCARDIOGRAM (TEE);  Surgeon: Lelon Perla, MD;  Location: [facility] ENDOSCOPY;  Service: Cardiovascular;  Laterality: N/A;   THYROIDECTOMY  11/24/2013   DR Dalbert Batman   THYROIDECTOMY N/A 11/24/2013   Procedure: TOTAL THYROIDECTOMY;  Surgeon: Adin Hector, MD;  Location: [facility];  Service: General;  Laterality: N/A;   TRANSTHORACIC ECHOCARDIOGRAM  01/15/2013   EF 55% TO 65%. PROBABLE MILD HYPOKINESIS OF THE INFERIOR MYOCARDIUM. GRADE 1 DIASTOLIC DYSFUNCTION. TRIAL AR.LA IS MILDLY DILATED.   Social History   Socioeconomic History   Marital status: Widowed    Spouse name: Not on file   Number of children: 3   Years of education: Not on file   Highest education level: Not on file    Occupational History   Occupation: retired  Scientist, product/process development strain: Not on file   Food insecurity    Worry: Not on file    Inability: Not on Lexicographer needs    Medical: Not on file    Non-medical: Not on file  Tobacco Use   Smoking status: Former Smoker    Packs/day: 1.00    Years: 15.00    Pack years: 15.00    Types: Cigarettes    Quit date: 12/10/1990    Years since quitting: 28.6   Smokeless tobacco: Never Used   Tobacco comment: occ alcohol  Substance and Sexual Activity   Alcohol use: Yes    Comment: very rare   Drug use: No   Sexual activity: Not on file  Lifestyle   Physical activity    Days per week: Not on file    Minutes per session: Not on file   Stress: Not on file  Relationships   Social connections    Talks on phone: Not on  file    Gets together: Not on file    Attends religious service: Not on file    Active member of club or organization: Not on file    Attends meetings of clubs or organizations: Not on file    Relationship status: Not on file   Intimate partner violence    Fear of current or ex partner: Not on file    Emotionally abused: Not on file    Physically abused: Not on file    Forced sexual activity: Not on file  Other Topics Concern   Not on file  Social History Narrative   Not on file   Family History  Problem Relation Age of Onset   Heart disease Mother    Sudden death Mother    Diabetes Father    Stroke Father    Bone cancer Sister    Sudden death Brother    Melanoma Brother    Heart disease Brother    Colon cancer Neg Hx    Esophageal cancer Neg Hx    Inflammatory bowel disease Neg Hx    Liver disease Neg Hx    Pancreatic cancer Neg Hx    Rectal cancer Neg Hx    Stomach cancer Neg Hx    I have reviewed her medical, social, and family history in detail and updated the electronic medical record as necessary.    PHYSICAL EXAMINATION  BP (!) 164/98 (BP  Location: Left Arm, Patient Position: Sitting, Cuff Size: Normal)    Pulse 76 Comment: irregular   Temp 98.3 F (36.8 C)    Ht 5\' 4"  (1.626 m) Comment: height measured without shoes   Wt 206 lb 8 oz (93.7 kg)    BMI 35.45 kg/m  Wt Readings from Last 3 Encounters:  07/17/19 206 lb 8 oz (93.7 kg)  05/02/19 206 lb (93.4 kg)  02/11/19 208 lb 9.6 oz (94.6 kg)  GEN: NAD, appears stated age, doesn't appear chronically ill PSYCH: Cooperative, without pressured speech EYE: Conjunctivae pink, sclerae anicteric ENT: MMM NECK: Supple, enlarged neck girth CV: Non-tachycardic RESP: No wheezing apparent GI: Soft, protuberant MSK/EXT: No significant lower extremity edema SKIN: No jaundice NEURO:  Alert & Oriented x 3, no focal deficits   REVIEW OF DATA  I reviewed the following data at the time of this encounter:  GI Procedures and Studies  2014 colonoscopy Do not have access to report however noted to be incomplete leading to barium enema as noted below  Laboratory Studies  Reviewed in epic  Imaging Studies  May 2020 CT abdomen with/without contrast IMPRESSION: 1. Benign Bosniak category 2 left renal cyst, which corresponds with the lesion seen on recent ultrasound. No evidence of renal mass or hydronephrosis. 2. 1.4 cm enhancing soft tissue nodule along the serosal surface of the distal gastric body, suspicious for GI stromal tumor. Recommend gastroenterology consultation, and consideration of endoscopic ultrasound for further evaluation. Aortic Atherosclerosis (ICD10-I70.0).  February 2014 barium enema IMPRESSION: Significant diverticular disease of the distal descending colon and rectosigmoid colon with spasm.  No persistent polypoid or constricting lesion is evident.  This study is somewhat compromised as described above.   ASSESSMENT  Ms. Baker is a 73 y.o. female with a pmh significant for paroxysmal atrial fibrillation (on anticoagulation), rheumatoid arthritis, ?  Nonischemic cardiomyopathy hypertension, hyperlipidemia, GERD, hypothyroidism, kidney lesion, diverticulosis.  The patient is seen today for evaluation and management of:  1. Submucosal lesion of stomach   2. Abnormal CT of the abdomen  The patient is hemodynamically and clinically stable.  No significant GI complaints.  Incidental finding of a submucosal/exophytic lesion of the stomach.  I reviewed the imaging this may end up being a leiomyoma or gist.  It remains very small.  Worthwhile for an attempt at EUS evaluation and possible sampling via FNA/FNB if able to be visualized.  If unable to be visualized with EUS or samples, may require surveillance.  The risks and benefits of endoscopic evaluation were discussed with the patient; these include but are not limited to the risk of perforation, infection, bleeding, missed lesions, lack of diagnosis, severe illness requiring hospitalization, as well as anesthesia and sedation related illnesses.  The patient is agreeable to proceed.  The risks and benefits of endoscopic evaluation were discussed with the patient; these include but are not limited to the risk of perforation, infection, bleeding, missed lesions, lack of diagnosis, severe illness requiring hospitalization, as well as anesthesia and sedation related illnesses.  The patient is agreeable to proceed.    PLAN  Laboratories as outlined below before EUS Schedule EUS with possible FNB of gastric submucosal/exophytic lesion Further recommendations after EUS   Orders Placed This Encounter  Procedures   CBC   Basic metabolic panel   Protime-INR   Ambulatory referral to Gastroenterology    New Prescriptions   No medications on file   Modified Medications   No medications on file    Planned Follow Up No follow-ups on file.   Justice Britain, MD [facility] Office # [phone]

## 2019-07-17 NOTE — Telephone Encounter (Signed)
[facility] Pre-operative Risk Assessment     Request for surgical clearance:     Endoscopy Procedure  What type of surgery is being performed?     EUS  When is this surgery scheduled?     09/02/19  What type of clearance is required ?   Pharmacy  Are there any medications that need to be held prior to surgery and how long? Eliquis for 2 days prior and 3 days after  Practice name and name of physician performing surgery?      [facility]  What is your office phone and fax number?      Phone- [phone]  [phone]  Anesthesia type (None, local, MAC, general) ?       MAC

## 2019-07-17 NOTE — Telephone Encounter (Signed)
 *  STAT* If patient is at the pharmacy, call can be transferred to refill team.   1. Which medications need to be refilled? (please list name of each medication and dose if known) metoprolol succinate (TOPROL-XL) 50 MG 24 hr tablet  2. Which pharmacy/location (including street and city if local pharmacy) is medication to be sent to? [facility]  3. Do they need a 30 day or 90 day supply? 90  Patient states that pharmacy says they have not received the prescription today. Please resend script.

## 2019-07-19 ENCOUNTER — Encounter: Payer: Self-pay | Admitting: Gastroenterology

## 2019-07-19 DIAGNOSIS — R935 Abnormal findings on diagnostic imaging of other abdominal regions, including retroperitoneum: Secondary | ICD-10-CM | POA: Insufficient documentation

## 2019-07-19 DIAGNOSIS — K3189 Other diseases of stomach and duodenum: Secondary | ICD-10-CM | POA: Insufficient documentation

## 2019-07-20 MED ORDER — METOPROLOL SUCCINATE ER 50 MG PO TB24: ORAL_TABLET | ORAL | 2 refills | Status: DC

## 2019-07-20 NOTE — Telephone Encounter (Signed)
Pt takes Eliquis for afib with CHADS2VASc score of 4 (age, sex, HTN, CAD). CrCl is 67mL/min. Ok to hold Eliquis for 2 days before, should not need a 3 day hold after. Resume as soon as safely possible after, usually within 24 hours.

## 2019-07-20 NOTE — Telephone Encounter (Signed)
Rx(s) sent to pharmacy electronically.  

## 2019-07-20 NOTE — Telephone Encounter (Signed)
Dr Rush Landmark, please review [facility] Eliquis recommendation for upcoming EUS. You said she should hold 2 days prior and 3 days after procedure. Please advise.

## 2019-07-20 NOTE — Telephone Encounter (Signed)
Spoke to patient. She understands to hold Eliquis for 2 days prior to EUS. Dr Rush Landmark will discuss the need to restart after EUS with possible biopsy. All questions answered,patient voiced understanding.

## 2019-07-20 NOTE — Telephone Encounter (Signed)
Please hold Eliquis 2 days prior to procedure.  Will discuss needs for restart after we complete the EUS with possible biopsy.  I am not sure that 24 hours will be long enough for waiting in case we do end of sampling but I will make a decision. Please proceed with scheduling. Thank you. GM

## 2019-07-20 NOTE — Telephone Encounter (Signed)
Please comment on eliquis. 

## 2019-08-05 DIAGNOSIS — R809 Proteinuria, unspecified: Secondary | ICD-10-CM | POA: Diagnosis not present

## 2019-08-05 DIAGNOSIS — N281 Cyst of kidney, acquired: Secondary | ICD-10-CM | POA: Diagnosis not present

## 2019-08-05 DIAGNOSIS — I129 Hypertensive chronic kidney disease with stage 1 through stage 4 chronic kidney disease, or unspecified chronic kidney disease: Secondary | ICD-10-CM | POA: Diagnosis not present

## 2019-08-05 DIAGNOSIS — N183 Chronic kidney disease, stage 3 (moderate): Secondary | ICD-10-CM | POA: Diagnosis not present

## 2019-08-05 DIAGNOSIS — K319 Disease of stomach and duodenum, unspecified: Secondary | ICD-10-CM | POA: Diagnosis not present

## 2019-08-05 DIAGNOSIS — M109 Gout, unspecified: Secondary | ICD-10-CM | POA: Diagnosis not present

## 2019-08-18 ENCOUNTER — Other Ambulatory Visit (INDEPENDENT_AMBULATORY_CARE_PROVIDER_SITE_OTHER): Payer: PPO

## 2019-08-18 DIAGNOSIS — K3189 Other diseases of stomach and duodenum: Secondary | ICD-10-CM

## 2019-08-18 DIAGNOSIS — R935 Abnormal findings on diagnostic imaging of other abdominal regions, including retroperitoneum: Secondary | ICD-10-CM

## 2019-08-18 LAB — BASIC METABOLIC PANEL
BUN: 15 mg/dL (ref 6–23)
CO2: 25 mEq/L (ref 19–32)
Calcium: 9.4 mg/dL (ref 8.4–10.5)
Chloride: 106 mEq/L (ref 96–112)
Creatinine, Ser: 1.01 mg/dL (ref 0.40–1.20)
GFR: 65.35 mL/min (ref >60.00)
Glucose, Bld: 99 mg/dL (ref 70–99)
Potassium: 4 mEq/L (ref 3.5–5.1)
Sodium: 141 mEq/L (ref 135–145)

## 2019-08-18 LAB — CBC
HCT: 43.1 % (ref 36.0–46.0)
Hemoglobin: 14 g/dL (ref 12.0–15.0)
MCHC: 32.5 g/dL (ref 30.0–36.0)
MCV: 89.6 fl (ref 78.0–100.0)
Platelets: 317 10*3/uL (ref 150.0–400.0)
RBC: 4.81 Mil/uL (ref 3.87–5.11)
RDW: 16.4 % — ABNORMAL HIGH (ref 11.5–15.5)
WBC: 5.2 10*3/uL (ref 4.0–10.5)

## 2019-08-18 LAB — PROTIME-INR
INR: 1.3 ratio — ABNORMAL HIGH (ref 0.8–1.0)
Prothrombin Time: 15.4 s — ABNORMAL HIGH (ref 9.6–13.1)

## 2019-08-19 DIAGNOSIS — I129 Hypertensive chronic kidney disease with stage 1 through stage 4 chronic kidney disease, or unspecified chronic kidney disease: Secondary | ICD-10-CM | POA: Diagnosis not present

## 2019-08-22 ENCOUNTER — Other Ambulatory Visit: Payer: Self-pay | Admitting: Family Medicine

## 2019-08-29 ENCOUNTER — Other Ambulatory Visit (HOSPITAL_COMMUNITY): Payer: PPO

## 2019-08-31 ENCOUNTER — Other Ambulatory Visit (HOSPITAL_COMMUNITY)
Admission: RE | Admit: 2019-08-31 | Discharge: 2019-08-31 | Disposition: A | Discharge status: Home / Self Care | Payer: PPO | Source: Ambulatory Visit | Attending: Gastroenterology | Admitting: Gastroenterology

## 2019-08-31 DIAGNOSIS — Z20828 Contact with and (suspected) exposure to other viral communicable diseases: Secondary | ICD-10-CM | POA: Insufficient documentation

## 2019-08-31 DIAGNOSIS — Z01812 Encounter for preprocedural laboratory examination: Secondary | ICD-10-CM | POA: Diagnosis not present

## 2019-08-31 LAB — SARS CORONAVIRUS 2 (TAT 6-24 HRS): SARS Coronavirus 2: NEGATIVE

## 2019-09-01 ENCOUNTER — Encounter (HOSPITAL_COMMUNITY): Payer: Self-pay | Admitting: *Deleted

## 2019-09-01 ENCOUNTER — Other Ambulatory Visit: Payer: Self-pay

## 2019-09-01 NOTE — Progress Notes (Signed)
Spoke with patient, has quarantined since covid 19 testing without fever or symptoms.  Patient  Verbalized understanding to be NPO after MN. Arrival time 0700.  Answered questions regarding procedure

## 2019-09-01 NOTE — Anesthesia Preprocedure Evaluation (Addendum)
Anesthesia Evaluation  Patient identified by MRN, date of birth, ID band Patient awake    Reviewed: Allergy & Precautions, NPO status , Patient's Chart, lab work & pertinent test results, reviewed documented beta blocker date and time   History of Anesthesia Complications Negative for: history of anesthetic complications  Airway Mallampati: II  TM Distance: >3 FB Neck ROM: Full    Dental  (+) Partial Lower, Partial Upper, Dental Advisory Given   Pulmonary sleep apnea and Continuous Positive Airway Pressure Ventilation , former smoker,    breath sounds clear to auscultation       Cardiovascular hypertension, Pt. on medications and Pt. on home beta blockers (-) angina+ CAD, + Past MI (1994) and +CHF  + dysrhythmias Atrial Fibrillation + Valvular Problems/Murmurs MR  Rhythm:Irregular Rate:Normal   '17 Myoperfusion - The left ventricular ejection fraction is normal (55-65%). Nuclear stress EF: 55%. There was no ST segment deviation noted during stress. Defect 1: There is a small defect of severe severity present in the basal inferolateral and mid inferolateral location. Findings consistent with prior myocardial infarction. This is a low risk study.  '17 TEE - EF 40%  to 45%. Mild AI, mild-mod MR, moderately dilated LA.    Neuro/Psych negative neurological ROS  negative psych ROS   GI/Hepatic Neg liver ROS, GERD  Controlled, Gastric mass    Endo/Other  Hypothyroidism   Renal/GU negative Renal ROS     Musculoskeletal  (+) Arthritis ,  Gout    Abdominal   Peds  Hematology negative hematology ROS (+)   Anesthesia Other Findings   Reproductive/Obstetrics                           Anesthesia Physical Anesthesia Plan  ASA: III  Anesthesia Plan: MAC   Post-op Pain Management:    Induction: Intravenous  PONV Risk Score and Plan: 2 and Propofol infusion and Treatment may vary due  to age or medical condition  Airway Management Planned: Nasal Cannula and Natural Airway  Additional Equipment: None  Intra-op Plan:   Post-operative Plan:   Informed Consent: I have reviewed the patients History and Physical, chart, labs and discussed the procedure including the risks, benefits and alternatives for the proposed anesthesia with the patient or authorized representative who has indicated his/her understanding and acceptance.       Plan Discussed with: CRNA and Anesthesiologist  Anesthesia Plan Comments:        Anesthesia Quick Evaluation

## 2019-09-02 ENCOUNTER — Ambulatory Visit (HOSPITAL_COMMUNITY): Payer: PPO | Admitting: Anesthesiology

## 2019-09-02 ENCOUNTER — Ambulatory Visit (HOSPITAL_COMMUNITY)
Admission: RE | Admit: 2019-09-02 | Discharge: 2019-09-02 | Disposition: A | Discharge status: Home / Self Care | Payer: PPO | Attending: Gastroenterology | Admitting: Gastroenterology

## 2019-09-02 ENCOUNTER — Encounter (HOSPITAL_COMMUNITY): Payer: Self-pay | Admitting: Anesthesiology

## 2019-09-02 ENCOUNTER — Encounter (HOSPITAL_COMMUNITY)
Admission: RE | Disposition: A | Discharge status: Home / Self Care | Payer: Self-pay | Source: Home / Self Care | Attending: Gastroenterology

## 2019-09-02 DIAGNOSIS — Z8541 Personal history of malignant neoplasm of cervix uteri: Secondary | ICD-10-CM | POA: Diagnosis not present

## 2019-09-02 DIAGNOSIS — B9681 Helicobacter pylori [H. pylori] as the cause of diseases classified elsewhere: Secondary | ICD-10-CM | POA: Diagnosis not present

## 2019-09-02 DIAGNOSIS — K3189 Other diseases of stomach and duodenum: Secondary | ICD-10-CM

## 2019-09-02 DIAGNOSIS — I2510 Atherosclerotic heart disease of native coronary artery without angina pectoris: Secondary | ICD-10-CM | POA: Insufficient documentation

## 2019-09-02 DIAGNOSIS — I110 Hypertensive heart disease with heart failure: Secondary | ICD-10-CM | POA: Insufficient documentation

## 2019-09-02 DIAGNOSIS — I4891 Unspecified atrial fibrillation: Secondary | ICD-10-CM | POA: Insufficient documentation

## 2019-09-02 DIAGNOSIS — G4730 Sleep apnea, unspecified: Secondary | ICD-10-CM | POA: Insufficient documentation

## 2019-09-02 DIAGNOSIS — I429 Cardiomyopathy, unspecified: Secondary | ICD-10-CM | POA: Insufficient documentation

## 2019-09-02 DIAGNOSIS — Z87891 Personal history of nicotine dependence: Secondary | ICD-10-CM | POA: Diagnosis not present

## 2019-09-02 DIAGNOSIS — K317 Polyp of stomach and duodenum: Secondary | ICD-10-CM | POA: Diagnosis not present

## 2019-09-02 DIAGNOSIS — I509 Heart failure, unspecified: Secondary | ICD-10-CM | POA: Diagnosis not present

## 2019-09-02 DIAGNOSIS — I899 Noninfective disorder of lymphatic vessels and lymph nodes, unspecified: Secondary | ICD-10-CM | POA: Diagnosis not present

## 2019-09-02 DIAGNOSIS — I252 Old myocardial infarction: Secondary | ICD-10-CM | POA: Insufficient documentation

## 2019-09-02 DIAGNOSIS — M069 Rheumatoid arthritis, unspecified: Secondary | ICD-10-CM | POA: Insufficient documentation

## 2019-09-02 DIAGNOSIS — R933 Abnormal findings on diagnostic imaging of other parts of digestive tract: Secondary | ICD-10-CM | POA: Diagnosis not present

## 2019-09-02 DIAGNOSIS — K219 Gastro-esophageal reflux disease without esophagitis: Secondary | ICD-10-CM | POA: Diagnosis not present

## 2019-09-02 DIAGNOSIS — Z955 Presence of coronary angioplasty implant and graft: Secondary | ICD-10-CM | POA: Insufficient documentation

## 2019-09-02 DIAGNOSIS — R935 Abnormal findings on diagnostic imaging of other abdominal regions, including retroperitoneum: Secondary | ICD-10-CM

## 2019-09-02 DIAGNOSIS — E039 Hypothyroidism, unspecified: Secondary | ICD-10-CM | POA: Diagnosis not present

## 2019-09-02 DIAGNOSIS — K2950 Unspecified chronic gastritis without bleeding: Secondary | ICD-10-CM | POA: Diagnosis not present

## 2019-09-02 DIAGNOSIS — E785 Hyperlipidemia, unspecified: Secondary | ICD-10-CM | POA: Insufficient documentation

## 2019-09-02 HISTORY — PX: EUS: SHX5427

## 2019-09-02 HISTORY — PX: BIOPSY: SHX5522

## 2019-09-02 HISTORY — PX: ESOPHAGOGASTRODUODENOSCOPY (EGD) WITH PROPOFOL: SHX5813

## 2019-09-02 HISTORY — PX: FINE NEEDLE ASPIRATION: SHX5430

## 2019-09-02 SURGERY — UPPER ENDOSCOPIC ULTRASOUND (EUS) RADIAL
Anesthesia: Monitor Anesthesia Care

## 2019-09-02 MED ORDER — LACTATED RINGERS IV SOLN
INTRAVENOUS | Status: DC
  Administered 2019-09-02: 09:00:00 via INTRAVENOUS

## 2019-09-02 MED ORDER — PHENYLEPHRINE HCL (PRESSORS) 10 MG/ML IV SOLN
INTRAVENOUS | Status: DC | PRN
  Administered 2019-09-02 (×3): 80 ug via INTRAVENOUS

## 2019-09-02 MED ORDER — LIDOCAINE HCL (CARDIAC) PF 100 MG/5ML IV SOSY
PREFILLED_SYRINGE | INTRAVENOUS | Status: DC | PRN
  Administered 2019-09-02: 5 mL via INTRATRACHEAL

## 2019-09-02 MED ORDER — OMEPRAZOLE 40 MG PO CPDR: 40.0000 mg | DELAYED_RELEASE_CAPSULE | Freq: Every day | ORAL | 4 refills | Status: DC

## 2019-09-02 MED ORDER — PROPOFOL 10 MG/ML IV BOLUS
INTRAVENOUS | Status: AC
  Filled 2019-09-02: qty 40

## 2019-09-02 MED ORDER — PROPOFOL 500 MG/50ML IV EMUL
INTRAVENOUS | Status: DC | PRN
  Administered 2019-09-02: 125 ug/kg/min via INTRAVENOUS

## 2019-09-02 MED ORDER — ATROPINE SULFATE 0.4 MG/ML IJ SOLN
INTRAMUSCULAR | Status: DC | PRN
  Administered 2019-09-02: 0.4 mg via INTRAVENOUS

## 2019-09-02 MED ORDER — PROPOFOL 500 MG/50ML IV EMUL
INTRAVENOUS | Status: DC | PRN
  Administered 2019-09-02: 50 mg via INTRAVENOUS

## 2019-09-02 MED ORDER — SODIUM CHLORIDE 0.9 % IV SOLN: INTRAVENOUS | Status: DC

## 2019-09-02 NOTE — Transfer of Care (Signed)
Immediate Anesthesia Transfer of Care Note  Patient: Claudia Cantrell  Procedure(s) Performed: Procedure(s) with comments: UPPER ENDOSCOPIC ULTRASOUND (EUS) RADIAL (N/A) - EUS radial/linear BIOPSY FINE NEEDLE ASPIRATION (FNA) LINEAR  Patient Location: PACU  Anesthesia Type:MAC  Level of Consciousness:  sedated, patient cooperative and responds to stimulation  Airway & Oxygen Therapy:Patient Spontanous Breathing and Patient connected to face mask oxgen  Post-op Assessment:  Report given to PACU RN and Post -op Vital signs reviewed and stable  Post vital signs:  Reviewed and stable  Last Vitals:  Vitals:   09/02/19 0738  BP: (!) 163/101  Pulse: 83  Resp: 14  Temp: 37.1 C  SpO2: 286%    Complications: No apparent anesthesia complications

## 2019-09-02 NOTE — Op Note (Addendum)
[facility] Patient Name: Claudia Cantrell Procedure Date: 09/02/2019 MRN: [id number] Attending MD: Justice Britain , MD Date of Birth: 02/25/1946 CSN: [id number] Age: 73 Admit Type: Outpatient Procedure:                Upper EUS Indications:              Suspected mass in stomach on CT scan Providers:                Justice Britain, MD, Cleda Daub, RN, Elspeth Cho Tech., Technician, Lina Sar,                            Technician, Arnoldo Hooker, CRNA Referring MD:              Medicines:                Monitored Anesthesia Care Complications:            Bradycardia Estimated Blood Loss:     Estimated blood loss was minimal. Procedure:                Pre-Anesthesia Assessment:                           - Prior to the procedure, a History and Physical                            was performed, and patient medications and                            allergies were reviewed. The patient's tolerance of                            previous anesthesia was also reviewed. The risks                            and benefits of the procedure and the sedation                            options and risks were discussed with the patient.                            All questions were answered, and informed consent                            was obtained. Prior Anticoagulants: The patient has                            taken Eliquis (apixaban), last dose was 4 days                            prior to procedure. ASA Grade Assessment: III - A  patient with severe systemic disease. After                            reviewing the risks and benefits, the patient was                            deemed in satisfactory condition to undergo the                            procedure.                           After obtaining informed consent, the endoscope was                            passed under direct vision. Throughout the                   procedure, the patient's blood pressure, pulse, and                            oxygen saturations were monitored continuously. The                            [id number]-H190 ([id number]) Olympus gastroscope was                            introduced through the mouth, and advanced to the                            second part of duodenum. The GF-UE160-AL5 ([id number])                            Olympus Radial EUS was introduced through the                            mouth, and advanced to the stomach for ultrasound                            examination. The [id number] ([id number]) Olympus Linear                            EUS was introduced through the mouth, and advanced                            to the stomach for ultrasound examination. The                            upper EUS was accomplished without difficulty. The                            patient tolerated the procedure poorly due to the                            patient's cardiovascular instability (arrhythmia). Scope  In: Scope Out: Findings:      ENDOSCOPIC FINDING: :      No gross lesions were noted in the entire esophagus.      The Z-line was regular and was found 42 cm from the incisors.      Striped moderately erythematous mucosa without bleeding was found in the       entire examined stomach. Biopsies were taken with a cold forceps for       histology and Helicobacter pylori testing.      A single 9 mm submucosal papule (nodule) with no bleeding and no       stigmata of recent bleeding was found on the greater curvature of the       gastric body - consistent with the likely location of the CT finding of       a submucosal exophytic lesion (more on EUS imaging below).      One 20 mm pedunculated polyp - query inflammatory vs adenomatous with no       bleeding and no stigmata of recent bleeding was found on the posterior       wall of the stomach. At the completion of the EUS, the plan was for       attempt at resection but  patient had arrthymia as noted below and after       discussion with Anesthesia we felt we would leave this for another day       and allow her complete today's procedure.      No gross lesions were noted in the duodenal bulb, in the first portion       of the duodenum and in the second portion of the duodenum.      ENDOSONOGRAPHIC FINDING: :      A round intramural (subepithelial) lesion was found in the greater curve       of the stomach in the distal body. The lesion was hypoechoic and had       calcium deposits. Sonographically, the lesion appeared to originate from       the muscularis propria (Layer 4) but was exophytic and seemed to       possibly involved the serosa (Layer 5). The lesion measured 15 mm (in       maximum thickness). The lesion also measured 12 mm in diameter. The       outer endosonographic borders were well defined. Fine needle biopsy was       performed. Color Doppler imaging was utilized prior to needle puncture       to confirm a lack of significant vascular structures within the needle       path. Two passes were made with the 22 gauge Acquire ultrasound biopsy       needle using a transgastric approach. A visible core of tissue was       obtained. Preliminary cytologic examination and touch preps were       performed. The location of the lesion and the movement of the lesion       with the 22 guage needle was such that I switched to a 25 guage Acquire       ultrasound biopsy needle and then did 4 more passes. This yielded easier       movement through the gastric wall into the lesion. Final cytology       results are pending.      A hypoechoic pedunculated lesionwas identified endosonographically in  the greater curve of the stomach on posterior wall. The mass measured 16       mm by 12 mm in maximal cross-sectional diameter. The endosonographic       borders were well-defined. This is consistent with the pedunculated       portion of the polyp on  endoscopy.      Endosonographic imaging in the visualized portion of the liver showed no       mass.      No malignant-appearing lymph nodes were visualized in the left gastric       region (level 17), gastrohepatic ligament (level 18), celiac region       (level 20) and perigastric region.      The celiac region was visualized. Impression:               EGD Impression:                           - No gross lesions in esophagus. Z-line regular, 42                            cm from the incisors.                           - Erythematous mucosa in the stomach. Biopsied for                            HP.                           - A single submucosal papule (nodule) found in the                            stomach - the site of submucosal lesion noted on CT.                           - One gastric polyp that was pedunculated. Due to                            patient stability, decision made to return in                            future for removal of this lesion.                           - No gross lesions in the duodenal bulb, in the                            first portion of the duodenum and in the second                            portion of the duodenum.                           EUS Impression:                           -  An intramural (subepithelial) lesion was found in                            the greater curve of the stomach. The lesion                            appeared to originate from within the muscularis                            propria (Layer 4). Tissue was obtained from this                            exam, and results are pending. 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 Fine needle                            biopsy performed.                           - A pedunculated lesion was found in  the greater                            curve of the stomach on posterior wall. This is                            consistent most likely with an inflammatory polyp                            or possible adenomatous polyp as noted above.                           - No malignant-appearing lymph nodes were                            visualized in the left gastric region (level 17),                            gastrohepatic ligament (level 18), celiac region                            (level 20) and perigastric region. Moderate Sedation:      Not Applicable - Patient had care per Anesthesia. Recommendation:           - The patient will be observed post-procedure,  until all discharge criteria are met.                           - Discharge patient to home.                           - Patient has a contact number available for                            emergencies. The signs and symptoms of potential                            delayed complications were discussed with the                            patient. Return to normal activities tomorrow.                            Written discharge instructions were provided to the                            patient.                           - Resume previous diet.                           - Start Omeprazole 40 mg daily (30 minutes before                            breakfast or before dinner).                           - Observe patient's clinical course.                           - Await cytology results and await path results.                           - Plan to proceed with repeat attempt at EGD with                            EMR in coming weeks for removal/resection of                            pedunculated polyp. After discussion with                            Anesthesia, they would still plan for a MAC                            sedation and be prepared for GA if needed.                           - Restart Eliquis in  72 hours to  decrease                            post-procedural bleeding risk.                           - If samples return non-diagnostic, will likely                            perform repeat EUS at time of EGD with EMR. Will                            also then discuss case at [facility] and consider surgical                            referral.                           - The findings and recommendations were discussed                            with the patient.                           - The findings and recommendations were discussed                            with the patient's family. Procedure Code(s):        --- Professional ---                           [phone], Esophagogastroduodenoscopy, flexible,                            transoral; with transendoscopic ultrasound-guided                            intramural or transmural fine needle                            aspiration/biopsy(s), (includes endoscopic                            ultrasound examination limited to the esophagus,                            stomach or duodenum, and adjacent structures) Diagnosis Code(s):        --- Professional ---                           K31.89, Other diseases of stomach and duodenum                           K31.7, Polyp of stomach and duodenum                           I89.9, Noninfective disorder of lymphatic  vessels                            and lymph nodes, unspecified                           R93.3, Abnormal findings on diagnostic imaging of                            other parts of digestive tract CPT copyright 2019 [employer]. All rights reserved. The codes documented in this report are preliminary and upon coder review may  be revised to meet current compliance requirements. Justice Britain, MD 09/02/2019 10:12:35 AM Number of Addenda: 0

## 2019-09-02 NOTE — H&P (Signed)
GASTROENTEROLOGY PROCEDURE H&P NOTE   Primary Care Physician: Girtha Rm, NP-C  HPI: Claudia Cantrell is a 73 y.o. female who presents for EGD/EUS possible FNA.  Past Medical History:  Diagnosis Date  . Arthritis    rheumatoid  . CAD (coronary artery disease)    a. 1992 s/p MI and PTCA of unknown vessel;  b. 09/2010 Cath: LM nl, LAD 20p, LCX 81m, RCA 18m, RPL 20.  Marland Kitchen Cervical cancer ([place]) 1977  . Family history of anesthesia complication    daughter has difficulty waking   . GERD (gastroesophageal reflux disease)    occ  . Gout 10/08/2016  . Hyperlipidemia   . Hypertensive heart disease   . Hypothyroidism   . Nonischemic cardiomyopathy ([place])    a. 07/2016 Echo: EF 40-45%, mild LVH, inferior akinesis, moderately dilated left atrium, trivial AI and MR.  Marland Kitchen PAF (paroxysmal atrial fibrillation) ([place])    a. 07/2016 Admitted w/ AF RVR-->CHA2DS2VASc = 5-->Eliquis;  b. 07/2016 successful TEE/DCCV.  Marland Kitchen Sleep apnea    a. Using CPAP.   Past Surgical History:  Procedure Laterality Date  . ABDOMINAL HYSTERECTOMY  1988  . BACK SURGERY  1994  . CARDIAC CATHETERIZATION  1991 AND 1992   PTCA BY DR Lamount Cohen  . CARDIOVERSION N/A 08/01/2016   Procedure: CARDIOVERSION;  Surgeon: Lelon Perla, MD;  Location: [facility] ENDOSCOPY;  Service: Cardiovascular;  Laterality: N/A;  . ORIF ANKLE FRACTURE Right 04/27/2015   Procedure: OPEN REDUCTION INTERNAL FIXATION (ORIF) RIGHT BIMALLEOLAR ANKLE FRACTURE WITH SYNDESMOSIS FIXATION;  Surgeon: Leandrew Koyanagi, MD;  Location: [place];  Service: Orthopedics;  Laterality: Right;  . TEE WITHOUT CARDIOVERSION N/A 08/01/2016   Procedure: TRANSESOPHAGEAL ECHOCARDIOGRAM (TEE);  Surgeon: Lelon Perla, MD;  Location[facility] ENDOSCOPY;  Service: Cardiovascular;  Laterality: N/A;  . THYROIDECTOMY  11/24/2013   DR Dalbert Batman  . THYROIDECTOMY N/A 11/24/2013   Procedure: TOTAL THYROIDECTOMY;  Surgeon: Adin Hector, MD;  Location: [place];  Service: General;  Laterality: N/A;   . TRANSTHORACIC ECHOCARDIOGRAM  01/15/2013   EF 55% TO 65%. PROBABLE MILD HYPOKINESIS OF THE INFERIOR MYOCARDIUM. GRADE 1 DIASTOLIC DYSFUNCTION. TRIAL AR.LA IS MILDLY DILATED.   Current Facility-Administered Medications  Medication Dose Route Frequency Provider Last Rate Last Dose  . 0.9 %  sodium chloride infusion   Intravenous Continuous Mansouraty, Telford Nab., MD       Allergies  Allergen Reactions  . Labetalol     headaches  . Codeine Anxiety   Family History  Problem Relation Age of Onset  . Heart disease Mother   . Sudden death Mother   . Diabetes Father   . Stroke Father   . Bone cancer Sister   . Sudden death Brother   . Melanoma Brother   . Heart disease Brother   . Colon cancer Neg Hx   . Esophageal cancer Neg Hx   . Inflammatory bowel disease Neg Hx   . Liver disease Neg Hx   . Pancreatic cancer Neg Hx   . Rectal cancer Neg Hx   . Stomach cancer Neg Hx    Social History   Socioeconomic History  . Marital status: Widowed    Spouse name: Not on file  . Number of children: 3  . Years of education: Not on file  . Highest education level: Not on file  Occupational History  . Occupation: retired  Scientific laboratory technician  . Financial resource strain: Not on file  . Food insecurity  Worry: Not on file    Inability: Not on file  . Transportation needs    Medical: Not on file    Non-medical: Not on file  Tobacco Use  . Smoking status: Former Smoker    Packs/day: 1.00    Years: 15.00    Pack years: 15.00    Types: Cigarettes    Quit date: 12/10/1990    Years since quitting: 28.7  . Smokeless tobacco: Never Used  . Tobacco comment: occ alcohol  Substance and Sexual Activity  . Alcohol use: Yes    Comment: very rare  . Drug use: No  . Sexual activity: Not on file  Lifestyle  . Physical activity    Days per week: Not on file    Minutes per session: Not on file  . Stress: Not on file  Relationships  . Social Herbalist on phone: Not on file     Gets together: Not on file    Attends religious service: Not on file    Active member of club or organization: Not on file    Attends meetings of clubs or organizations: Not on file    Relationship status: Not on file  . Intimate partner violence    Fear of current or ex partner: Not on file    Emotionally abused: Not on file    Physically abused: Not on file    Forced sexual activity: Not on file  Other Topics Concern  . Not on file  Social History Narrative  . Not on file    Physical Exam: Vital signs in last 24 hours: Temp:  [98.8 F (37.1 C)] 98.8 F (37.1 C) (09/23 0738) Pulse Rate:  [83] 83 (09/23 0738) Resp:  [14] 14 (09/23 0738) BP: (163)/(101) 163/101 (09/23 0738) SpO2:  [100 %] 100 % (09/23 0738) Weight:  [93.4 kg] 93.4 kg (09/23 0733)   GEN: NAD EYE: Sclerae anicteric ENT: MMM CV: Non-tachycardic GI: Soft, NT/ND NEURO:  Alert & Oriented x 3  Lab Results: No results for input(s): WBC, HGB, HCT, PLT in the last 72 hours. BMET No results for input(s): NA, K, CL, CO2, GLUCOSE, BUN, CREATININE, CALCIUM in the last 72 hours. LFT No results for input(s): PROT, ALBUMIN, AST, ALT, ALKPHOS, BILITOT, BILIDIR, IBILI in the last 72 hours. PT/INR No results for input(s): LABPROT, INR in the last 72 hours.   Impression / Plan: This is a 73 y.o.female who presents for EGD/EUS with possible FNA.  The risks of EUS including bleeding, infection, aspiration pneumonia and intestinal perforation were discussed as was the possibility it may not give a definitive diagnosis.  If a biopsy of the pancreas is done as part of the EUS, there is an additional risk of pancreatitis at the rate of about 1%.  It was explained that procedure related pancreatitis is typically mild, although can be severe and even life threatening, which is why we do not perform random pancreatic biopsies and only biopsy a lesion we feel is concerning enough to warrant the risk.  The risks and benefits of  endoscopic evaluation were discussed with the patient; these include but are not limited to the risk of perforation, infection, bleeding, missed lesions, lack of diagnosis, severe illness requiring hospitalization, as well as anesthesia and sedation related illnesses.  The patient is agreeable to proceed.    Justice Britain, MD [facility] # [phone]

## 2019-09-02 NOTE — Discharge Instructions (Signed)
YOU HAD AN ENDOSCOPIC PROCEDURE TODAY: Refer to the procedure report and other information in the discharge instructions given to you for any specific questions about what was found during the examination. If this information does not answer your questions, please call [place] office at [phone] to clarify.   YOU SHOULD EXPECT: Some feelings of bloating in the abdomen. Passage of more gas than usual. Walking can help get rid of the air that was put into your GI tract during the procedure and reduce the bloating. If you had a lower endoscopy (such as a colonoscopy or flexible sigmoidoscopy) you may notice spotting of blood in your stool or on the toilet paper. Some abdominal soreness may be present for a day or two, also.  DIET: Your first meal following the procedure should be a light meal and then it is ok to progress to your normal diet. A half-sandwich or bowl of soup is an example of a good first meal. Heavy or fried foods are harder to digest and may make you feel nauseous or bloated. Drink plenty of fluids but you should avoid alcoholic beverages for 24 hours. If you had a esophageal dilation, please see attached instructions for diet.    ACTIVITY: Your care partner should take you home directly after the procedure. You should plan to take it easy, moving slowly for the rest of the day. You can resume normal activity the day after the procedure however YOU SHOULD NOT DRIVE, use power tools, machinery or perform tasks that involve climbing or major physical exertion for 24 hours (because of the sedation medicines used during the test).   SYMPTOMS TO REPORT IMMEDIATELY: A gastroenterologist can be reached at any hour. Please call [phone]  for any of the following symptoms:   Following upper endoscopy (EGD, EUS, ERCP, esophageal dilation) Vomiting of blood or coffee ground material  New, significant abdominal pain  New, significant chest pain or pain under the shoulder blades  Painful or  persistently difficult swallowing  New shortness of breath  Black, tarry-looking or red, bloody stools  FOLLOW UP:  If any biopsies were taken you will be contacted by phone or by letter within the next 1-3 weeks. Call [phone]  if you have not heard about the biopsies in 3 weeks.  Please also call with any specific questions about appointments or follow up tests.  

## 2019-09-02 NOTE — Anesthesia Postprocedure Evaluation (Signed)
Anesthesia Post Note  Patient: VALYNCIA WIENS  Procedure(s) Performed: UPPER ENDOSCOPIC ULTRASOUND (EUS) RADIAL (N/A ) BIOPSY FINE NEEDLE ASPIRATION (FNA) LINEAR     Patient location during evaluation: PACU Anesthesia Type: MAC Level of consciousness: awake and alert Pain management: pain level controlled Vital Signs Assessment: post-procedure vital signs reviewed and stable Respiratory status: spontaneous breathing, nonlabored ventilation and respiratory function stable Cardiovascular status: stable and blood pressure returned to baseline Anesthetic complications: no    Last Vitals:  Vitals:   09/02/19 1000 09/02/19 1010  BP: 128/74 (!) 148/87  Pulse: 75   Resp: 16 14  Temp:    SpO2: 96% 98%    Last Pain:  Vitals:   09/02/19 0950  TempSrc:   PainSc: 0-No pain                 Audry Pili

## 2019-09-03 ENCOUNTER — Other Ambulatory Visit: Payer: Self-pay | Admitting: Gastroenterology

## 2019-09-03 ENCOUNTER — Encounter (HOSPITAL_COMMUNITY): Payer: Self-pay | Admitting: Gastroenterology

## 2019-09-03 DIAGNOSIS — R238 Other skin changes: Secondary | ICD-10-CM | POA: Diagnosis not present

## 2019-09-03 LAB — CYTOLOGY - NON PAP

## 2019-09-03 LAB — SURGICAL PATHOLOGY

## 2019-09-04 ENCOUNTER — Other Ambulatory Visit: Payer: Self-pay

## 2019-09-04 MED ORDER — DOXYCYCLINE HYCLATE 100 MG PO CAPS: 100.0000 mg | ORAL_CAPSULE | Freq: Two times a day (BID) | ORAL | 0 refills | Status: AC

## 2019-09-04 MED ORDER — BISMUTH SUBSALICYLATE 262 MG PO TABS: 2.0000 | ORAL_TABLET | Freq: Four times a day (QID) | ORAL | 0 refills | Status: AC

## 2019-09-04 MED ORDER — METRONIDAZOLE 250 MG PO TABS: 500.0000 mg | ORAL_TABLET | Freq: Four times a day (QID) | ORAL | 0 refills | Status: AC

## 2019-09-12 ENCOUNTER — Encounter: Payer: Self-pay | Admitting: Gastroenterology

## 2019-09-12 ENCOUNTER — Telehealth: Payer: Self-pay | Admitting: Gastroenterology

## 2019-09-12 NOTE — Telephone Encounter (Signed)
I called and spoke with the patient on 09/11/2019. We went over the results of her fine-needle biopsy which did show evidence of atypical cells but was nondiagnostic for gist or leiomyoma or other etiology for the submucosal gastric mass. We know that we have to go back and remove her gastric polyp as well. We will plan to proceed with an EGD/EUS with EMR 90-minute slot in approximately 4 to 6 weeks. We will also plan to obtain H. pylori biopsies to see if she has eradicated the infection. Patient very appreciative for all the care that she is received and willing to undergo repeat procedure. If we are unsuccessful at obtaining a diagnosis via 5 needle biopsy then will plan to send to surgery to discuss possible surgical resection.   Justice Britain, MD [facility] # [phone]

## 2019-09-21 ENCOUNTER — Other Ambulatory Visit: Payer: Self-pay

## 2019-09-21 ENCOUNTER — Encounter (HOSPITAL_COMMUNITY): Payer: Self-pay | Admitting: Emergency Medicine

## 2019-09-21 ENCOUNTER — Ambulatory Visit (INDEPENDENT_AMBULATORY_CARE_PROVIDER_SITE_OTHER): Payer: PPO | Admitting: Family Medicine

## 2019-09-21 ENCOUNTER — Inpatient Hospital Stay (HOSPITAL_COMMUNITY)
Admission: EM | Admit: 2019-09-21 | Discharge: 2019-09-23 | DRG: 292 | Disposition: A | Discharge status: Home / Self Care | Payer: PPO | Source: Ambulatory Visit | Attending: Cardiology | Admitting: Cardiology

## 2019-09-21 ENCOUNTER — Encounter: Payer: Self-pay | Admitting: Family Medicine

## 2019-09-21 ENCOUNTER — Emergency Department (HOSPITAL_COMMUNITY): Payer: PPO

## 2019-09-21 VITALS — BP 160/110 | HR 91 | Temp 98.4°F | Wt 205.6 lb

## 2019-09-21 DIAGNOSIS — I509 Heart failure, unspecified: Secondary | ICD-10-CM | POA: Diagnosis not present

## 2019-09-21 DIAGNOSIS — R000 Tachycardia, unspecified: Secondary | ICD-10-CM | POA: Diagnosis not present

## 2019-09-21 DIAGNOSIS — E559 Vitamin D deficiency, unspecified: Secondary | ICD-10-CM | POA: Diagnosis not present

## 2019-09-21 DIAGNOSIS — M069 Rheumatoid arthritis, unspecified: Secondary | ICD-10-CM | POA: Diagnosis present

## 2019-09-21 DIAGNOSIS — N189 Chronic kidney disease, unspecified: Secondary | ICD-10-CM

## 2019-09-21 DIAGNOSIS — I480 Paroxysmal atrial fibrillation: Secondary | ICD-10-CM | POA: Diagnosis present

## 2019-09-21 DIAGNOSIS — I119 Hypertensive heart disease without heart failure: Secondary | ICD-10-CM | POA: Diagnosis present

## 2019-09-21 DIAGNOSIS — I5041 Acute combined systolic (congestive) and diastolic (congestive) heart failure: Secondary | ICD-10-CM

## 2019-09-21 DIAGNOSIS — Z9861 Coronary angioplasty status: Secondary | ICD-10-CM | POA: Diagnosis not present

## 2019-09-21 DIAGNOSIS — R079 Chest pain, unspecified: Secondary | ICD-10-CM | POA: Diagnosis not present

## 2019-09-21 DIAGNOSIS — Z9989 Dependence on other enabling machines and devices: Secondary | ICD-10-CM

## 2019-09-21 DIAGNOSIS — K219 Gastro-esophageal reflux disease without esophagitis: Secondary | ICD-10-CM | POA: Diagnosis not present

## 2019-09-21 DIAGNOSIS — I10 Essential (primary) hypertension: Secondary | ICD-10-CM | POA: Diagnosis present

## 2019-09-21 DIAGNOSIS — Z20828 Contact with and (suspected) exposure to other viral communicable diseases: Secondary | ICD-10-CM | POA: Diagnosis present

## 2019-09-21 DIAGNOSIS — Z888 Allergy status to other drugs, medicaments and biological substances status: Secondary | ICD-10-CM

## 2019-09-21 DIAGNOSIS — M109 Gout, unspecified: Secondary | ICD-10-CM | POA: Diagnosis present

## 2019-09-21 DIAGNOSIS — I252 Old myocardial infarction: Secondary | ICD-10-CM | POA: Diagnosis not present

## 2019-09-21 DIAGNOSIS — Z8249 Family history of ischemic heart disease and other diseases of the circulatory system: Secondary | ICD-10-CM

## 2019-09-21 DIAGNOSIS — E039 Hypothyroidism, unspecified: Secondary | ICD-10-CM | POA: Diagnosis present

## 2019-09-21 DIAGNOSIS — Z79890 Hormone replacement therapy (postmenopausal): Secondary | ICD-10-CM

## 2019-09-21 DIAGNOSIS — E785 Hyperlipidemia, unspecified: Secondary | ICD-10-CM | POA: Diagnosis present

## 2019-09-21 DIAGNOSIS — G4733 Obstructive sleep apnea (adult) (pediatric): Secondary | ICD-10-CM | POA: Diagnosis present

## 2019-09-21 DIAGNOSIS — R002 Palpitations: Secondary | ICD-10-CM

## 2019-09-21 DIAGNOSIS — I2510 Atherosclerotic heart disease of native coronary artery without angina pectoris: Secondary | ICD-10-CM | POA: Diagnosis not present

## 2019-09-21 DIAGNOSIS — Z8541 Personal history of malignant neoplasm of cervix uteri: Secondary | ICD-10-CM

## 2019-09-21 DIAGNOSIS — R0600 Dyspnea, unspecified: Secondary | ICD-10-CM | POA: Diagnosis not present

## 2019-09-21 DIAGNOSIS — Z87891 Personal history of nicotine dependence: Secondary | ICD-10-CM | POA: Diagnosis not present

## 2019-09-21 DIAGNOSIS — I160 Hypertensive urgency: Secondary | ICD-10-CM | POA: Diagnosis not present

## 2019-09-21 DIAGNOSIS — Z79899 Other long term (current) drug therapy: Secondary | ICD-10-CM

## 2019-09-21 DIAGNOSIS — R7303 Prediabetes: Secondary | ICD-10-CM | POA: Diagnosis not present

## 2019-09-21 DIAGNOSIS — Z7901 Long term (current) use of anticoagulants: Secondary | ICD-10-CM

## 2019-09-21 DIAGNOSIS — R0789 Other chest pain: Secondary | ICD-10-CM | POA: Diagnosis not present

## 2019-09-21 DIAGNOSIS — Z885 Allergy status to narcotic agent status: Secondary | ICD-10-CM

## 2019-09-21 DIAGNOSIS — I4819 Other persistent atrial fibrillation: Secondary | ICD-10-CM | POA: Diagnosis present

## 2019-09-21 DIAGNOSIS — I428 Other cardiomyopathies: Secondary | ICD-10-CM | POA: Diagnosis present

## 2019-09-21 DIAGNOSIS — I110 Hypertensive heart disease with heart failure: Principal | ICD-10-CM | POA: Diagnosis present

## 2019-09-21 DIAGNOSIS — R0602 Shortness of breath: Secondary | ICD-10-CM | POA: Diagnosis not present

## 2019-09-21 DIAGNOSIS — I25119 Atherosclerotic heart disease of native coronary artery with unspecified angina pectoris: Secondary | ICD-10-CM | POA: Diagnosis not present

## 2019-09-21 DIAGNOSIS — I4891 Unspecified atrial fibrillation: Secondary | ICD-10-CM | POA: Diagnosis not present

## 2019-09-21 DIAGNOSIS — R0609 Other forms of dyspnea: Secondary | ICD-10-CM

## 2019-09-21 LAB — BASIC METABOLIC PANEL
Anion gap: 9 (ref 5–15)
BUN: 12 mg/dL (ref 8–23)
CO2: 25 mmol/L (ref 22–32)
Calcium: 9.7 mg/dL (ref 8.9–10.3)
Chloride: 106 mmol/L (ref 98–111)
Creatinine, Ser: 0.98 mg/dL (ref 0.44–1.00)
GFR calc Af Amer: >60 mL/min (ref >60)
GFR calc non Af Amer: 58 mL/min — ABNORMAL LOW (ref >60)
Glucose, Bld: 89 mg/dL (ref 70–99)
Potassium: 4 mmol/L (ref 3.5–5.1)
Sodium: 140 mmol/L (ref 135–145)

## 2019-09-21 LAB — CBC
HCT: 49.8 % — ABNORMAL HIGH (ref 36.0–46.0)
Hemoglobin: 15.7 g/dL — ABNORMAL HIGH (ref 12.0–15.0)
MCH: 28.8 pg (ref 26.0–34.0)
MCHC: 31.5 g/dL (ref 30.0–36.0)
MCV: 91.4 fL (ref 80.0–100.0)
Platelets: 246 10*3/uL (ref 150–400)
RBC: 5.45 MIL/uL — ABNORMAL HIGH (ref 3.87–5.11)
RDW: 15.3 % (ref 11.5–15.5)
WBC: 4.4 10*3/uL (ref 4.0–10.5)
nRBC: 0 % (ref 0.0–0.2)

## 2019-09-21 LAB — TROPONIN I (HIGH SENSITIVITY)
Troponin I (High Sensitivity): 9 ng/L (ref <18)
Troponin I (High Sensitivity): 9 ng/L (ref <18)

## 2019-09-21 LAB — BRAIN NATRIURETIC PEPTIDE: B Natriuretic Peptide: 858.8 pg/mL — ABNORMAL HIGH (ref 0.0–100.0)

## 2019-09-21 LAB — MAGNESIUM: Magnesium: 2.5 mg/dL — ABNORMAL HIGH (ref 1.7–2.4)

## 2019-09-21 MED ORDER — METRONIDAZOLE 500 MG PO TABS
250.0000 mg | ORAL_TABLET | Freq: Three times a day (TID) | ORAL | Status: DC
  Administered 2019-09-21 – 2019-09-23 (×5): 250 mg via ORAL
  Filled 2019-09-21 (×5): qty 1

## 2019-09-21 MED ORDER — DIPHENHYDRAMINE HCL 50 MG/ML IJ SOLN
12.5000 mg | Freq: Once | INTRAMUSCULAR | Status: AC
  Administered 2019-09-21: 18:00:00 12.5 mg via INTRAVENOUS
  Filled 2019-09-21: qty 1

## 2019-09-21 MED ORDER — ONDANSETRON HCL 4 MG/2ML IJ SOLN: 4.0000 mg | Freq: Four times a day (QID) | INTRAMUSCULAR | Status: DC | PRN

## 2019-09-21 MED ORDER — SODIUM CHLORIDE 0.9% FLUSH
3.0000 mL | Freq: Once | INTRAVENOUS | Status: AC
  Administered 2019-09-21: 3 mL via INTRAVENOUS

## 2019-09-21 MED ORDER — HYDRALAZINE HCL 10 MG PO TABS
10.0000 mg | ORAL_TABLET | Freq: Three times a day (TID) | ORAL | Status: DC
  Administered 2019-09-21 – 2019-09-23 (×6): 10 mg via ORAL
  Filled 2019-09-21 (×9): qty 1

## 2019-09-21 MED ORDER — ISOSORBIDE DINITRATE 10 MG PO TABS
5.0000 mg | ORAL_TABLET | Freq: Three times a day (TID) | ORAL | Status: DC
  Administered 2019-09-21 – 2019-09-23 (×6): 5 mg via ORAL
  Filled 2019-09-21 (×9): qty 1

## 2019-09-21 MED ORDER — CARVEDILOL 25 MG PO TABS
25.0000 mg | ORAL_TABLET | Freq: Two times a day (BID) | ORAL | Status: DC
  Administered 2019-09-22 – 2019-09-23 (×4): 25 mg via ORAL
  Filled 2019-09-21: qty 1
  Filled 2019-09-21: qty 2
  Filled 2019-09-21 (×3): qty 1

## 2019-09-21 MED ORDER — PANTOPRAZOLE SODIUM 40 MG PO TBEC
80.0000 mg | DELAYED_RELEASE_TABLET | Freq: Every day | ORAL | Status: DC
  Administered 2019-09-22 – 2019-09-23 (×2): 80 mg via ORAL
  Filled 2019-09-21 (×3): qty 2

## 2019-09-21 MED ORDER — SODIUM CHLORIDE 0.9% FLUSH: 3.0000 mL | Freq: Once | INTRAVENOUS | Status: DC

## 2019-09-21 MED ORDER — AMLODIPINE BESYLATE 10 MG PO TABS
10.0000 mg | ORAL_TABLET | Freq: Every day | ORAL | Status: DC
  Administered 2019-09-22 – 2019-09-23 (×4): 10 mg via ORAL
  Filled 2019-09-21: qty 2
  Filled 2019-09-21 (×2): qty 1

## 2019-09-21 MED ORDER — APIXABAN 5 MG PO TABS
5.0000 mg | ORAL_TABLET | Freq: Two times a day (BID) | ORAL | Status: DC
  Administered 2019-09-21 – 2019-09-23 (×4): 5 mg via ORAL
  Filled 2019-09-21 (×5): qty 1

## 2019-09-21 MED ORDER — IRBESARTAN 300 MG PO TABS
300.0000 mg | ORAL_TABLET | Freq: Every day | ORAL | Status: DC
  Administered 2019-09-21 – 2019-09-22 (×2): 300 mg via ORAL
  Filled 2019-09-21 (×3): qty 1

## 2019-09-21 MED ORDER — AMLODIPINE BESYLATE 5 MG PO TABS
10.0000 mg | ORAL_TABLET | Freq: Once | ORAL | Status: AC
  Administered 2019-09-21: 10 mg via ORAL
  Filled 2019-09-21: qty 2

## 2019-09-21 MED ORDER — FUROSEMIDE 10 MG/ML IJ SOLN
40.0000 mg | Freq: Two times a day (BID) | INTRAMUSCULAR | Status: DC
  Administered 2019-09-21: 19:00:00 40 mg via INTRAVENOUS
  Filled 2019-09-21: qty 4

## 2019-09-21 MED ORDER — ACETAMINOPHEN 325 MG PO TABS
650.0000 mg | ORAL_TABLET | Freq: Three times a day (TID) | ORAL | Status: DC | PRN
  Administered 2019-09-23: 650 mg via ORAL
  Filled 2019-09-21: qty 2

## 2019-09-21 MED ORDER — LEVOTHYROXINE SODIUM 112 MCG PO TABS
112.0000 ug | ORAL_TABLET | Freq: Every day | ORAL | Status: DC
  Administered 2019-09-22 – 2019-09-23 (×2): 112 ug via ORAL
  Filled 2019-09-21 (×2): qty 1

## 2019-09-21 MED ORDER — ROSUVASTATIN CALCIUM 20 MG PO TABS
40.0000 mg | ORAL_TABLET | Freq: Every day | ORAL | Status: DC
  Administered 2019-09-21 – 2019-09-23 (×3): 40 mg via ORAL
  Filled 2019-09-21 (×4): qty 2

## 2019-09-21 MED ORDER — PROCHLORPERAZINE EDISYLATE 10 MG/2ML IJ SOLN
5.0000 mg | Freq: Once | INTRAMUSCULAR | Status: AC
  Administered 2019-09-21: 5 mg via INTRAVENOUS
  Filled 2019-09-21: qty 2

## 2019-09-21 MED ORDER — ACETAMINOPHEN 500 MG PO TABS
1000.0000 mg | ORAL_TABLET | Freq: Once | ORAL | Status: AC
  Administered 2019-09-21: 1000 mg via ORAL
  Filled 2019-09-21: qty 2

## 2019-09-21 NOTE — ED Notes (Addendum)
Pt noted to have increased work of breathing, O2 sats 88 to 90% on 2L; O2 increased to 4L, sats 92, 93%; Dr. Harrell Gave of cardiology notified

## 2019-09-21 NOTE — ED Triage Notes (Signed)
Pt arrives via EMS from PCP office with EKG changes and CP that began Friday. Pt reports off and on right sided CP for the last 2 months. Pain is increased with exertion, having SOB. Vitals 192/120, HR 100 afib, O2 96 %, 97.7. 324mg  ASA. 1 nitro. Some improvement with nitro.

## 2019-09-21 NOTE — H&P (Signed)
Cardiology Admission History and Physical:   Patient ID: CARMELITE SHARE MRN: [id number]; DOB: 03-Apr-1946   Admission date: 09/21/2019  Primary Care Provider: Girtha Rm, NP-C Primary Cardiologist: Shelva Majestic, MD Primary Electrophysiologist:  None   Chief Complaint:  Chest tightness, shortness of breath  Patient Profile:   Claudia Cantrell is a 73 y.o. female with CAD s/p prior PTCA 1991, 1992, HTN, HLD, OSA on CPAP  History of Present Illness:   Ms. Bustos reports that she has had progressive shortness of breath and generally feeling worse over the last few months. This has coincided with rising blood pressures. She believes she has been in afib for several months. Over the last few days she has had worsening shortness of breath and chest tightness with minimal exertion. She endorses orthopnea and LE edema. Her weight has intentionally been going down from 230 lbs to 205 lbs with diet changes.   While I was in the room, she was visibly short of breath just getting to bedside commode. Her O2 sats were 89%, increasing to 96% with nasal cannula. She is conversationally dyspneic.  She has felt warm but no clear fevers/chills. Had negative Covid prior to recent endoscopy. Had to hold her apixaban before and after endoscopy.  Chest pressure is central, exertional but also positional, does not radiate, related to shortness of breath. Has been gradually worsening, now occasionally at rest/lying down. Nothing else clearly makes it better/worse. Has been using her CPAP more often to try to help her breathing/chest pressure  Er evaluation notable for unremarkable CXR, initial Tn 9. Telemetry shows afib with rates to 120s with exertion.  Heart Pathway Score:     Past Medical History:  Diagnosis Date  . Arthritis    rheumatoid  . CAD (coronary artery disease)    a. 1992 s/p MI and PTCA of unknown vessel;  b. 09/2010 Cath: LM nl, LAD 20p, LCX 25m, RCA 35m, RPL 20.  Marland Kitchen Cervical cancer ([place])  1977  . Family history of anesthesia complication    daughter has difficulty waking   . GERD (gastroesophageal reflux disease)    occ  . Gout 10/08/2016  . Hyperlipidemia   . Hypertensive heart disease   . Hypothyroidism   . Nonischemic cardiomyopathy ([place])    a. 07/2016 Echo: EF 40-45%, mild LVH, inferior akinesis, moderately dilated left atrium, trivial AI and MR.  Marland Kitchen PAF (paroxysmal atrial fibrillation) ([place])    a. 07/2016 Admitted w/ AF RVR-->CHA2DS2VASc = 5-->Eliquis;  b. 07/2016 successful TEE/DCCV.  Marland Kitchen Sleep apnea    a. Using CPAP.    Past Surgical History:  Procedure Laterality Date  . ABDOMINAL HYSTERECTOMY  1988  . BACK SURGERY  1994  . BIOPSY  09/02/2019   Procedure: BIOPSY;  Surgeon: Rush Landmark Telford Nab., MD;  Location: [facility];  Service: Gastroenterology;;  . [place]   PTCA BY DR Lamount Cohen  . CARDIOVERSION N/A 08/01/2016   Procedure: CARDIOVERSION;  Surgeon: Lelon Perla, MD;  Location: [facility] END[facility]COPY;  Service: Cardiovascular;  Laterality: N/A;  . ESOPHAGOGASTRODUODENOSCOPY (EGD) WITH PROPOFOL N/A 09/02/2019   Procedure: ESOPHAGOGASTRODUODENOSCOPY (EGD) WITH PROPOFOL;  Surgeon: Irving Copas., MD;  Location: [facility] ENDOSCOPY;  Service: Gastroenterology;  Laterality: N/A;  . EUS N/A 09/02/2019   Procedure: UPPER ENDOSCOPIC ULTRASOUND (EUS) RADIAL;  Surgeon: Rush Landmark Telford Nab., MD;  Location: [facility] ENDOSCOPY;  Service: Gastroenterology;  Laterality: N/A;  EUS radial/linear  . FINE NEEDLE ASPIRATION  09/02/2019   Procedure: FINE  NEEDLE ASPIRATION (FNA) LINEAR;  Surgeon: Irving Copas., MD;  Location: [facility];  Service: Gastroenterology;;  . ORIF ANKLE FRACTURE Right 04/27/2015   Procedure: OPEN REDUCTION INTERNAL FIXATION (ORIF) RIGHT BIMALLEOLAR ANKLE FRACTURE WITH SYNDESMOSIS FIXATION;  Surgeon: Leandrew Koyanagi, MD;  Location: [place];  Service: Orthopedics;  Laterality: Right;  . TEE WITHOUT CARDIOVERSION N/A  08/01/2016   Procedure: TRANSESOPHAGEAL ECHOCARDIOGRAM (TEE);  Surgeon: Lelon Perla, MD;  Location: [facility] ENDOSCOPY;  Service: Cardiovascular;  Laterality: N/A;  . THYROIDECTOMY  11/24/2013   DR Dalbert Batman  . THYROIDECTOMY N/A 11/24/2013   Procedure: TOTAL THYROIDECTOMY;  Surgeon: Adin Hector, MD;  Location: [facility];  Service: General;  Laterality: N/A;  . TRANSTHORACIC ECHOCARDIOGRAM  01/15/2013   EF 55% TO 65%. PROBABLE MILD HYPOKINESIS OF THE INFERIOR MYOCARDIUM. GRADE 1 DIASTOLIC DYSFUNCTION. TRIAL AR.LA IS MILDLY DILATED.     Medications Prior to Admission: Prior to Admission medications   Medication Sig Start Date End Date Taking? Authorizing Provider  acetaminophen (TYLENOL) 650 MG CR tablet Take 1,300 mg by mouth every 8 (eight) hours as needed for pain.   Yes [provider]  amLODipine (NORVASC) 10 MG tablet Take 10 mg by mouth daily at 3 pm.  08/05/19  Yes [provider]  Cholecalciferol (DIALYVITE VITAMIN D 5000) 125 MCG (5000 UT) capsule Take 5,000 Units by mouth daily.   Yes [provider]  doxycycline (VIBRAMYCIN) 100 MG capsule Take 100 mg by mouth 2 (two) times daily. For 10 days   Yes [provider]  ELIQUIS 5 MG TABS tablet Take 1 tablet by mouth twice daily Patient taking differently: Take 5 mg by mouth 2 (two) times daily.  03/23/19  Yes Troy Sine, MD  labetalol (NORMODYNE) 100 MG tablet Take 100 mg by mouth 2 (two) times daily.   Yes [provider]  metroNIDAZOLE (FLAGYL) 250 MG tablet Take 250 mg by mouth 3 (three) times daily.   Yes [provider]  olmesartan (BENICAR) 40 MG tablet Take 40 mg by mouth at bedtime.    Yes [provider]  rosuvastatin (CRESTOR) 40 MG tablet Take 1 tablet by mouth once daily Patient taking differently: Take 40 mg by mouth daily with lunch.  07/17/19  Yes Troy Sine, MD  SYNTHROID 112 MCG tablet Take 1 tablet by mouth once daily Patient taking differently: Take 112  mcg by mouth daily before breakfast.  08/24/19  Yes Henson, Vickie L, NP-C  BISMATROL 262 MG chewable tablet Chew 524 mg by mouth 4 (four) times daily. For 10 days 09/07/19   [provider]  omeprazole (PRILOSEC) 40 MG capsule Take 1 capsule (40 mg total) by mouth daily. Take 30 minutes before breakfast or dinner Patient not taking: Reported on 09/21/2019 09/02/19 09/01/20  Mansouraty, Telford Nab., MD     Allergies:    Allergies  Allergen Reactions  . Labetalol     headaches  . Codeine Anxiety    Social History:   Social History   Socioeconomic History  . Marital status: Widowed    Spouse name: Not on file  . Number of children: 3  . Years of education: Not on file  . Highest education level: Not on file  Occupational History  . Occupation: retired  Scientific laboratory technician  . Financial resource strain: Not on file  . Food insecurity    Worry: Not on file    Inability: Not on file  . Transportation needs    Medical:  Not on file    Non-medical: Not on file  Tobacco Use  . Smoking status: Former Smoker    Packs/day: 1.00    Years: 15.00    Pack years: 15.00    Types: Cigarettes    Quit date: 12/10/1990    Years since quitting: 28.8  . Smokeless tobacco: Never Used  . Tobacco comment: occ alcohol  Substance and Sexual Activity  . Alcohol use: Yes    Comment: very rare  . Drug use: No  . Sexual activity: Not on file  Lifestyle  . Physical activity    Days per week: Not on file    Minutes per session: Not on file  . Stress: Not on file  Relationships  . Social Herbalist on phone: Not on file    Gets together: Not on file    Attends religious service: Not on file    Active member of club or organization: Not on file    Attends meetings of clubs or organizations: Not on file    Relationship status: Not on file  . Intimate partner violence    Fear of current or ex partner: Not on file    Emotionally abused: Not on file    Physically abused: Not on file     Forced sexual activity: Not on file  Other Topics Concern  . Not on file  Social History Narrative  . Not on file    Family History:   The patient's family history includes Bone cancer in her sister; Diabetes in her father; Heart disease in her brother and mother; Melanoma in her brother; Stroke in her father; Sudden death in her brother and mother. There is no history of Colon cancer, Esophageal cancer, Inflammatory bowel disease, Liver disease, Pancreatic cancer, Rectal cancer, or Stomach cancer.    ROS:  Please see the history of present illness.  Constitutional: Negative for chills, fever, night sweats, unintentional weight loss (has been losing intentionally) HENT: Negative for ear pain and hearing loss.   Eyes: Negative for loss of vision and eye pain.  Respiratory: positive for shortness of breath, no phelgm Cardiovascular: See HPI. Gastrointestinal: Negative for abdominal pain, melena, and hematochezia.  Genitourinary: Negative for dysuria and hematuria.  Musculoskeletal: Negative for falls and myalgias.  Skin: Negative for itching and rash.  Neurological: Negative for focal weakness, focal sensory changes and loss of consciousness.  Endo/Heme/Allergies: Does not bruise/bleed easily.  All other ROS reviewed and negative.     Physical Exam/Data:   Vitals:   09/21/19 1610 09/21/19 1710 09/21/19 1715 09/21/19 1750  BP:   (!) 176/121 (!) 181/118  Pulse: 90 92 80 100  Resp: 16 19 20  (!) 22  Temp:      TempSrc:      SpO2: 99% 95% 96% 92%  Weight:      Height:       No intake or output data in the 24 hours ending 09/21/19 1842 Last 3 Weights 09/21/2019 09/21/2019 09/02/2019  Weight (lbs) 205 lb 205 lb 9.6 oz 206 lb  Weight (kg) 92.987 kg 93.26 kg 93.441 kg     Body mass index is 34.11 kg/m.  General:  Appears mildly uncomfortable, conversationally dyspneic HEENT: normal Lymph: no adenopathy Neck: JVD elevated 2 cm above clavicle sitting upright. Endocrine:  No  thryomegaly Vascular: No carotid bruits; RA, DP pulses 2+ bilaterally  Cardiac:  Tachycardic, irregularly irregular Lungs:  Bilateral rales at bases Abd: soft, nontender, no hepatomegaly  Ext:  bilateral trace LE edema Musculoskeletal:  No deformities, BUE and BLE strength normal and equal Skin: warm and dry  Neuro:  CNs 2-12 intact, no focal abnormalities noted Psych:  Normal affect    EKG:  The ECG that was done today was personally reviewed and demonstrates atrial fibrillation at 90 bpm  Telemetry shows afib with rates to 120s with movement  Relevant CV Studies: Last studies 2017 reviewed  Laboratory Data:  High Sensitivity Troponin:   Recent Labs  Lab 09/21/19 1623  TROPONINIHS 9      Chemistry Recent Labs  Lab 09/21/19 1623  NA 140  K 4.0  CL 106  CO2 25  GLUCOSE 89  BUN 12  CREATININE 0.98  CALCIUM 9.7  GFRNONAA 58*  GFRAA >60  ANIONGAP 9    No results for input(s): PROT, ALBUMIN, AST, ALT, ALKPHOS, BILITOT in the last 168 hours. Hematology Recent Labs  Lab 09/21/19 1623  WBC 4.4  RBC 5.45*  HGB 15.7*  HCT 49.8*  MCV 91.4  MCH 28.8  MCHC 31.5  RDW 15.3  PLT 246   BNPNo results for input(s): BNP, PROBNP in the last 168 hours.  DDimer No results for input(s): DDIMER in the last 168 hours.   Radiology/Studies:  Dg Chest 2 View  Result Date: 09/21/2019 CLINICAL DATA:  Chest pain EXAM: CHEST - 2 VIEW COMPARISON:  Chest radiograph dated 07/29/2016 FINDINGS: The heart remains enlarged. The lungs are clear. Degenerative changes are seen in the spine. IMPRESSION: Cardiomegaly.  Clear lungs. Electronically Signed   By: Zerita Boers M.D.   On: 09/21/2019 14:35    Assessment and Plan:   Chest pressure, shortness of breath, bibasilar rales, elevated blood pressure, LE edema, acute hypoxia: -in the setting of afib, concerning for acute on chronic (given progression over months) heart failure -also has hypertensive urgency, intermittent atrial  fibrillation with RVR  Will start with the following: -change labetalol to carvedilol for HR, BP control -continue apixaban. Missed doses for GI procedure, cannot cardiovert without TEE -continue amlodipine, ARB -with heart failure symptoms, would not use dilitazem -will add isordil/hydralazine for additional afterload reduction -appears volume up, will start BID lasix -will plan for lexiscan tomorrow (of note, prior with fixed defect but no ischemia) -additional troponin to get delta, but reassuring that initial is 9 with the duration of her symptoms -update echo given concern for heart failure (prior in 2017 EF 40-45%, though on nuclear reported as normal)  Severity of Illness: The appropriate patient status for this patient is INPATIENT. Inpatient status is judged to be reasonable and necessary in order to provide the required intensity of service to ensure the patient's safety. The patient's presenting symptoms, physical exam findings, and initial radiographic and laboratory data in the context of their chronic comorbidities is felt to place them at high risk for further clinical deterioration. Furthermore, it is not anticipated that the patient will be medically stable for discharge from the hospital within 2 midnights of admission. The following factors support the patient status of inpatient.   " The patient's presenting symptoms include conversational dyspnea, chest pressure, shortness of breath. " The worrisome physical exam findings include bibasilar rales, LE edema. " The initial radiographic and laboratory data are worrisome because of (adding BNP). " The chronic co-morbidities include HTN, HLD, CAD.   * I certify that at the point of admission it is my clinical judgment that the patient will require inpatient hospital care spanning beyond 2 midnights from the point of  admission due to high intensity of service, high risk for further deterioration and high frequency of surveillance  required.*    For questions or updates, please contact [place] Please consult [email or web address] for contact info under        Signed, Buford Dresser, MD  09/21/2019 6:42 PM

## 2019-09-21 NOTE — ED Provider Notes (Signed)
[place] EMERGENCY DEPARTMENT Provider Note   CSN: [id number] Arrival date & time: 09/21/19  1259     History   Chief Complaint Chief Complaint  Patient presents with  . Chest Pain  . Abnormal ECG    HPI Claudia Cantrell is a 73 y.o. female.     73 yo F with a chief complaint of chest pain.  Feels like a pressure associated with some shortness of breath.  Is been going on for about 4 days now.  Patient initially could not find anything that made it better or worse but when prompted she said exertion made it worse.  No diaphoresis no nausea or vomiting.  Feels a little bit like when she had a heart attack in the past though states she has not had any diaphoresis like she did previously.  She also noted that her heart rate is been irregular.  She is acutely aware of this and so that it also started on Friday.  She was seen by her doctor in the office today and they were concerned about EKG changes and sent her to the ED for evaluation.  The history is provided by the patient.  Chest Pain Pain location:  Substernal area Pain quality: pressure   Pain radiates to:  Does not radiate Pain severity:  Moderate Onset quality:  Gradual Duration:  4 days Timing:  Constant Progression:  Worsening Chronicity:  New Relieved by:  Nothing Worsened by:  Nothing Ineffective treatments:  None tried Associated symptoms: palpitations and shortness of breath   Associated symptoms: no dizziness, no fever, no headache, no nausea and no vomiting     Past Medical History:  Diagnosis Date  . Arthritis    rheumatoid  . CAD (coronary artery disease)    a. 1992 s/p MI and PTCA of unknown vessel;  b. 09/2010 Cath: LM nl, LAD 20p, LCX 88m, RCA 37m, RPL 20.  Marland Kitchen Cervical cancer ([place]) 1977  . Family history of anesthesia complication    daughter has difficulty waking   . GERD (gastroesophageal reflux disease)    occ  . Gout 10/08/2016  . Hyperlipidemia   . Hypertensive heart  disease   . Hypothyroidism   . Nonischemic cardiomyopathy ([place])    a. 07/2016 Echo: EF 40-45%, mild LVH, inferior akinesis, moderately dilated left atrium, trivial AI and MR.  Marland Kitchen PAF (paroxysmal atrial fibrillation) ([place])    a. 07/2016 Admitted w/ AF RVR-->CHA2DS2VASc = 5-->Eliquis;  b. 07/2016 successful TEE/DCCV.  Marland Kitchen Sleep apnea    a. Using CPAP.    Patient Active Problem List   Diagnosis Date Noted  . Persistent atrial fibrillation ([place]) 09/21/2019  . Submucosal lesion of stomach 07/19/2019  . Abnormal CT of the abdomen 07/19/2019  . Abnormal CT scan, stomach 06/17/2019  . Advance directive discussed with patient 01/14/2019  . Chronic anticoagulation 01/14/2019  . Chronic kidney disease 01/14/2019  . Prediabetes 01/14/2019  . Persistent proteinuria 02/17/2018  . 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 Vitamin D deficiency 03/26/2017  . Medication monitoring encounter 03/26/2017  . Gout 10/08/2016  . PAF (paroxysmal atrial fibrillation) ([place])   . Hypertensive heart disease   . Hyperlipidemia   . CAD (coronary artery disease)   . Nonischemic cardiomyopathy ([place])   . Chest pain with moderate risk for cardiac etiology 08/02/2016  . Atrial fibrillation ([place]) 07/29/2016  . Closed right ankle fracture 04/24/2015  . Ankle fracture, bimalleolar, closed 04/24/2015  . 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 CAD in native artery 10/09/2013  . OSA on CPAP 10/09/2013  . Essential hypertension, benign 05/27/2013    Past Surgical History:  Procedure Laterality Date  . ABDOMINAL HYSTERECTOMY  1988  . BACK SURGERY  1994  . BIOPSY  09/02/2019   Procedure: BIOPSY;  Surgeon: Rush Landmark Telford Nab., MD;  Location: [facility] END[facility];  Service: Gastroenterology;;  . [place]   PTCA BY DR Lamount Cohen  . CARDIOVERSION N/A 08/01/2016   Procedure: CARDIOVERSION;  Surgeon: Lelon Perla, MD;  Location: [facility] ENDOSCOPY;   Service: Cardiovascular;  Laterality: N/A;  . ESOPHAGOGASTRODUODENOSCOPY (EGD) WITH PROPOFOL N/A 09/02/2019   Procedure: ESOPHAGOGASTRODUODENOSCOPY (EGD) WITH PROPOFOL;  Surgeon: Irving Copas., MD;  Location: [facility] ENDOSCOPY;  Service: Gastroenterology;  Laterality: N/A;  . EUS N/A 09/02/2019   Procedure: UPPER ENDOSCOPIC ULTRASOUND (EUS) RADIAL;  Surgeon: Rush Landmark Telford Nab., MD;  Location: [facility] ENDOSCOPY;  Service: Gastroenterology;  Laterality: N/A;  EUS radial/linear  . FINE NEEDLE ASPIRATION  09/02/2019   Procedure: FINE NEEDLE ASPIRATION (FNA) LINEAR;  Surgeon: Irving Copas., MD;  Location: [facility] ENDOSCOPY;  Service: Gastroenterology;;  . ORIF ANKLE FRACTURE Right 04/27/2015   Procedure: OPEN REDUCTION INTERNAL FIXATION (ORIF) RIGHT BIMALLEOLAR ANKLE FRACTURE WITH SYNDESMOSIS FIXATION;  Surgeon: Leandrew Koyanagi, MD;  Location: [facility];  Service: Orthopedics;  Laterality: Right;  . TEE WITHOUT CARDIOVERSION N/A 08/01/2016   Procedure: TRANSESOPHAGEAL ECHOCARDIOGRAM (TEE);  Surgeon: Lelon Perla, MD;  Location[facility] END[facility]COPY;  Service: Cardiovascular;  Laterality: N/A;  . THYROIDECTOMY  11/24/2013   DR Dalbert Batman  . THYROIDECTOMY N/A 11/24/2013   Procedure: TOTAL THYROIDECTOMY;  Surgeon: Adin Hector, MD;  Location: [facility];  Service: General;  Laterality: N/A;  . TRANSTHORACIC ECHOCARDIOGRAM  01/15/2013   EF 55% TO 65%. PROBABLE MILD HYPOKINESIS OF THE INFERIOR MYOCARDIUM. GRADE 1 DIASTOLIC DYSFUNCTION. TRIAL AR.LA IS MILDLY DILATED.     OB History   No obstetric history on file.      Home Medications    Prior to Admission medications   Medication Sig Start Date End Date Taking? Authorizing Provider  acetaminophen (TYLENOL) 650 MG CR tablet Take 1,300 mg by mouth every 8 (eight) hours as needed for pain.   Yes [provider]  amLODipine (NORVASC) 10 MG tablet Take 10 mg by mouth daily at 3 pm.  08/05/19  Yes [provider]  Cholecalciferol (DIALYVITE  VITAMIN D 5000) 125 MCG (5000 UT) capsule Take 5,000 Units by mouth daily.   Yes [provider]  doxycycline (VIBRAMYCIN) 100 MG capsule Take 100 mg by mouth 2 (two) times daily. For 10 days   Yes [provider]  ELIQUIS 5 MG TABS tablet Take 1 tablet by mouth twice daily Patient taking differently: Take 5 mg by mouth 2 (two) times daily.  03/23/19  Yes Troy Sine, MD  labetalol (NORMODYNE) 100 MG tablet Take 100 mg by mouth 2 (two) times daily.   Yes [provider]  metroNIDAZOLE (FLAGYL) 250 MG tablet Take 250 mg by mouth 3 (three) times daily.   Yes [provider]  olmesartan (BENICAR) 40 MG tablet Take 40 mg by mouth at bedtime.    Yes [provider]  rosuvastatin (CRESTOR) 40 MG tablet Take 1 tablet by mouth once daily Patient taking differently: Take 40 mg by mouth daily with lunch.  07/17/19  Yes Troy Sine, MD  SYNTHROID 112 MCG tablet Take 1 tablet by mouth once daily  Patient taking differently: Take 112 mcg by mouth daily before breakfast.  08/24/19  Yes Henson, Vickie L, NP-C  BISMATROL 262 MG chewable tablet Chew 524 mg by mouth 4 (four) times daily. For 10 days 09/07/19   [provider]  omeprazole (PRILOSEC) 40 MG capsule Take 1 capsule (40 mg total) by mouth daily. Take 30 minutes before breakfast or dinner Patient not taking: Reported on 09/21/2019 09/02/19 09/01/20  Mansouraty, Telford Nab., MD    Family History Family History  Problem Relation Age of Onset  . Heart disease Mother   . Sudden death Mother   . Diabetes Father   . Stroke Father   . Bone cancer Sister   . Sudden death Brother   . Melanoma Brother   . Heart disease Brother   . Colon cancer Neg Hx   . Esophageal cancer Neg Hx   . Inflammatory bowel disease Neg Hx   . Liver disease Neg Hx   . Pancreatic cancer Neg Hx   . Rectal cancer Neg Hx   . Stomach cancer Neg Hx     Social History Social History   Tobacco Use  . Smoking status: Former  Smoker    Packs/day: 1.00    Years: 15.00    Pack years: 15.00    Types: Cigarettes    Quit date: 12/10/1990    Years since quitting: 28.8  . Smokeless tobacco: Never Used  . Tobacco comment: occ alcohol  Substance Use Topics  . Alcohol use: Yes    Comment: very rare  . Drug use: No     Allergies   Labetalol and Codeine   Review of Systems Review of Systems  Constitutional: Negative for chills and fever.  HENT: Negative for congestion and rhinorrhea.   Eyes: Negative for redness and visual disturbance.  Respiratory: Positive for shortness of breath. Negative for wheezing.   Cardiovascular: Positive for chest pain and palpitations.  Gastrointestinal: Negative for nausea and vomiting.  Genitourinary: Negative for dysuria and urgency.  Musculoskeletal: Negative for arthralgias and myalgias.  Skin: Negative for pallor and wound.  Neurological: Negative for dizziness and headaches.     Physical Exam Updated Vital Signs BP (!) 170/115   Pulse 80   Temp 98.6 F (37 C) (Oral)   Resp 20   Ht 5\' 5"  (1.651 m)   Wt 93 kg   SpO2 91%   BMI 34.11 kg/m   Physical Exam Vitals signs and nursing note reviewed.  Constitutional:      General: She is not in acute distress.    Appearance: She is well-developed. She is not diaphoretic.  HENT:     Head: Normocephalic and atraumatic.  Eyes:     Pupils: Pupils are equal, round, and reactive to light.  Neck:     Musculoskeletal: Normal range of motion and neck supple.  Cardiovascular:     Rate and Rhythm: Normal rate. Rhythm irregular.     Heart sounds: No murmur. No friction rub. No gallop.   Pulmonary:     Effort: Pulmonary effort is normal.     Breath sounds: No wheezing or rales.  Abdominal:     General: There is no distension.     Palpations: Abdomen is soft.     Tenderness: There is no abdominal tenderness.  Musculoskeletal:        General: No tenderness.  Skin:    General: Skin is warm and dry.  Neurological:      Mental Status: She is alert  and oriented to person, place, and time.  Psychiatric:        Behavior: Behavior normal.      ED Treatments / Results  Labs (all labs ordered are listed, but only abnormal results are displayed) Labs Reviewed  BASIC METABOLIC PANEL - Abnormal; Notable for the following components:      Result Value   GFR calc non Af Amer 58 (*)    All other components within normal limits  CBC - Abnormal; Notable for the following components:   RBC 5.45 (*)    Hemoglobin 15.7 (*)    HCT 49.8 (*)    All other components within normal limits  MAGNESIUM - Abnormal; Notable for the following components:   Magnesium 2.5 (*)    All other components within normal limits  SARS CORONAVIRUS 2 (TAT 6-24 HRS)  BRAIN NATRIURETIC PEPTIDE  BASIC METABOLIC PANEL  LIPID PANEL  CBC  TROPONIN I (HIGH SENSITIVITY)  TROPONIN I (HIGH SENSITIVITY)    EKG EKG Interpretation  Date/Time:  Monday September 21 2019 13:30:00 EDT Ventricular Rate:  90 PR Interval:    QRS Duration: 86 QT Interval:  352 QTC Calculation: 430 R Axis:   7 Text Interpretation:  Atrial fibrillation Low voltage QRS Cannot rule out Anterior infarct , age undetermined Abnormal ECG Otherwise no significant change Confirmed by Deno Etienne [phone]) on 09/21/2019 4:02:35 PM   Radiology Dg Chest 2 View  Result Date: 09/21/2019 CLINICAL DATA:  Chest pain EXAM: CHEST - 2 VIEW COMPARISON:  Chest radiograph dated 07/29/2016 FINDINGS: The heart remains enlarged. The lungs are clear. Degenerative changes are seen in the spine. IMPRESSION: Cardiomegaly.  Clear lungs. Electronically Signed   By: Zerita Boers M.D.   On: 09/21/2019 14:35    Procedures Procedures (including critical care time) Procedure note: Ultrasound Guided Peripheral IV Ultrasound guided peripheral 1.88 inch angiocath IV placement performed by me. Indications: Nursing unable to place IV. Details: The antecubital fossa and upper arm were evaluated with a  multifrequency linear probe. Patent brachial veins were noted. 1 attempt was made to cannulate a vein under realtime US guidance with successful cannulation of the vein and catheter placement. There is return of non-pulsatile dark red blood. The patient tolerated the procedure well without complications. Images archived electronically.  CPT codes: [phone] and [phone]  Medications Ordered in ED Medications  sodium chloride flush (NS) 0.9 % injection 3 mL (has no administration in time range)  acetaminophen (TYLENOL) tablet 650 mg (has no administration in time range)  amLODipine (NORVASC) tablet 10 mg (0 mg Oral Hold 09/21/19 1954)  rosuvastatin (CRESTOR) tablet 40 mg (has no administration in time range)  irbesartan (AVAPRO) tablet 300 mg (has no administration in time range)  levothyroxine (SYNTHROID) tablet 112 mcg (has no administration in time range)  apixaban (ELIQUIS) tablet 5 mg (has no administration in time range)  pantoprazole (PROTONIX) EC tablet 80 mg (80 mg Oral Not Given 09/21/19 2052)  metroNIDAZOLE (FLAGYL) tablet 250 mg (has no administration in time range)  carvedilol (COREG) tablet 25 mg (has no administration in time range)  ondansetron (ZOFRAN) injection 4 mg (has no administration in time range)  hydrALAZINE (APRESOLINE) tablet 10 mg (has no administration in time range)  isosorbide dinitrate (ISORDIL) tablet 5 mg (has no administration in time range)  furosemide (LASIX) injection 40 mg (40 mg Intravenous Given 09/21/19 1901)  amLODipine (NORVASC) tablet 10 mg (10 mg Oral Given 09/21/19 1800)  prochlorperazine (COMPAZINE) injection 5 mg (5 mg Intravenous Given  09/21/19 1815)  diphenhydrAMINE (BENADRYL) injection 12.5 mg (12.5 mg Intravenous Given 09/21/19 1811)  acetaminophen (TYLENOL) tablet 1,000 mg (1,000 mg Oral Given 09/21/19 1759)  sodium chloride flush (NS) 0.9 % injection 3 mL (3 mLs Intravenous Given 09/21/19 2044)     Initial Impression / Assessment and Plan /  ED Course  I have reviewed the triage vital signs and the nursing notes.  Pertinent labs & imaging results that were available during my care of the patient were reviewed by me and considered in my medical decision making (see chart for details).        73 yo F with a chief complaint of chest pain.  It was difficult for me to ascertain if this was just symptomatic A. fib versus new chest pain symptoms.  Patient seems hesitant think that is all due to her atrial fibrillation.  Missed her dose of eliquis this morning.  Will obtain lab work, cxr, discuss with cards.   Cards to admit.   The patients results and plan were reviewed and discussed.   Any x-rays performed were independently reviewed by myself.   Differential diagnosis were considered with the presenting HPI.  Medications  sodium chloride flush (NS) 0.9 % injection 3 mL (has no administration in time range)  acetaminophen (TYLENOL) tablet 650 mg (has no administration in time range)  amLODipine (NORVASC) tablet 10 mg (0 mg Oral Hold 09/21/19 1954)  rosuvastatin (CRESTOR) tablet 40 mg (has no administration in time range)  irbesartan (AVAPRO) tablet 300 mg (has no administration in time range)  levothyroxine (SYNTHROID) tablet 112 mcg (has no administration in time range)  apixaban (ELIQUIS) tablet 5 mg (has no administration in time range)  pantoprazole (PROTONIX) EC tablet 80 mg (80 mg Oral Not Given 09/21/19 2052)  metroNIDAZOLE (FLAGYL) tablet 250 mg (has no administration in time range)  carvedilol (COREG) tablet 25 mg (has no administration in time range)  ondansetron (ZOFRAN) injection 4 mg (has no administration in time range)  hydrALAZINE (APRESOLINE) tablet 10 mg (has no administration in time range)  isosorbide dinitrate (ISORDIL) tablet 5 mg (has no administration in time range)  furosemide (LASIX) injection 40 mg (40 mg Intravenous Given 09/21/19 1901)  amLODipine (NORVASC) tablet 10 mg (10 mg Oral Given 09/21/19  1800)  prochlorperazine (COMPAZINE) injection 5 mg (5 mg Intravenous Given 09/21/19 1815)  diphenhydrAMINE (BENADRYL) injection 12.5 mg (12.5 mg Intravenous Given 09/21/19 1811)  acetaminophen (TYLENOL) tablet 1,000 mg (1,000 mg Oral Given 09/21/19 1759)  sodium chloride flush (NS) 0.9 % injection 3 mL (3 mLs Intravenous Given 09/21/19 2044)    Vitals:   09/21/19 1815 09/21/19 1845 09/21/19 1853 09/21/19 1930  BP:  (!) 139/115 (!) 139/115 (!) 170/115  Pulse: (!) 107  (!) 105 80  Resp: (!) 32  (!) 28 20  Temp:      TempSrc:      SpO2: 97%  92% 91%  Weight:      Height:        Final diagnoses:  Paroxysmal atrial fibrillation ([place])    Admission/ observation were discussed with the admitting physician, patient and/or family and they are comfortable with the plan.    Final Clinical Impressions(s) / ED Diagnoses   Final diagnoses:  Paroxysmal atrial fibrillation [facility])    ED Discharge Orders    None       Deno Etienne, DO 09/21/19 2113

## 2019-09-21 NOTE — Progress Notes (Signed)
   Subjective:    Patient ID: Claudia Cantrell, female    DOB: [birth year], 73 y.o.   MRN: [id number]  HPI Chief Complaint  Patient presents with  . bp issues    follow-up on bp issues, bp high 168/128    She is a 73 year old [place] American female with a history of CAD, A-fib, hypertensive heart disease, uncontrolled HTN, CKD who is here today with severely elevated BP, DOE, palpitations and chest pain.   BP at home running in the 150s-160/120s-130 for the past 2 months.   States she has been taking her medications everyday as prescribed and states she "does not think her medications are working".   Taking labetalol 100 mg twice daily, amlodipine 10 mg, olmesartan 40 mg,   She is on Eliquis 5 mg daily for A-fib.   Reports taking rosuvastatin daily without any side effects.   Last seen by her cardiologist in March 2020.   Taking synthroid daily on an empty stomach.   Reviewed allergies, medications, past medical, surgical, family, and social history.   Review of Systems Pertinent positives and negatives in the history of present illness.     Objective:   Physical Exam Constitutional:      Appearance: She is ill-appearing.  Cardiovascular:     Rate and Rhythm: Normal rate. Rhythm irregular.     Pulses: Normal pulses.  Pulmonary:     Effort: Pulmonary effort is normal.     Breath sounds: Normal breath sounds.  Skin:    General: Skin is warm and dry.  Neurological:     General: No focal deficit present.     Mental Status: She is alert and oriented to person, place, and time.     Cranial Nerves: Cranial nerve deficit present.    BP (!) 160/110   Pulse 91   Temp 98.4 F (36.9 C)   Wt 205 lb 9.6 oz (93.3 kg)   BMI 35.29 kg/m       Assessment & Plan:  Chest pain, unspecified type - Plan: EKG 12-Lead  Severe uncontrolled hypertension - Plan: EKG 12-Lead  PAF (paroxysmal atrial fibrillation) (HCC) - Plan: EKG 12-Lead  Coronary artery disease involving native  coronary artery of native heart without angina pectoris - Plan: EKG 12-Lead  OSA on CPAP  Hypertensive heart disease with congestive heart failure, unspecified heart failure type (HCC)  Vitamin D deficiency  Prediabetes  Chronic kidney disease, unspecified CKD stage  Chronic anticoagulation  Palpitations - Plan: EKG 12-Lead  DOE (dyspnea on exertion) - Plan: EKG 12-Lead  Will send her to ED via ambulance due to symptoms of chest pain, DOE, palpitations, and risk for acute cardiac event.

## 2019-09-21 NOTE — Telephone Encounter (Signed)
Message to schedule pt in about 4 weeks.

## 2019-09-21 NOTE — ED Notes (Signed)
Pt c/o increased HA and increased SOB, pt noted to be increasingly hypertensive; Dr.Floyd notified

## 2019-09-21 NOTE — ED Notes (Signed)
Pt very short of breath after using bedside commode; O2 sats 89% RA; pt placed on 2 L via [place]; cardiology at bedside and is aware

## 2019-09-21 NOTE — ED Notes (Signed)
Pt currently has eased work of breathing, states she is feeling less short of breath; 91, 92% on 2L; HR 90s to low 100s, afib on monitor

## 2019-09-22 ENCOUNTER — Inpatient Hospital Stay (HOSPITAL_COMMUNITY): Payer: PPO

## 2019-09-22 DIAGNOSIS — R0789 Other chest pain: Secondary | ICD-10-CM

## 2019-09-22 DIAGNOSIS — R079 Chest pain, unspecified: Secondary | ICD-10-CM

## 2019-09-22 DIAGNOSIS — I10 Essential (primary) hypertension: Secondary | ICD-10-CM

## 2019-09-22 DIAGNOSIS — I480 Paroxysmal atrial fibrillation: Secondary | ICD-10-CM

## 2019-09-22 LAB — LIPID PANEL
Cholesterol: 114 mg/dL (ref 0–200)
HDL: 34 mg/dL — ABNORMAL LOW (ref >40)
LDL Cholesterol: 61 mg/dL (ref 0–99)
Total CHOL/HDL Ratio: 3.4 RATIO
Triglycerides: 94 mg/dL (ref <150)
VLDL: 19 mg/dL (ref 0–40)

## 2019-09-22 LAB — BASIC METABOLIC PANEL
Anion gap: 11 (ref 5–15)
BUN: 12 mg/dL (ref 8–23)
CO2: 25 mmol/L (ref 22–32)
Calcium: 9 mg/dL (ref 8.9–10.3)
Chloride: 105 mmol/L (ref 98–111)
Creatinine, Ser: 0.96 mg/dL (ref 0.44–1.00)
GFR calc Af Amer: >60 mL/min (ref >60)
GFR calc non Af Amer: 60 mL/min — ABNORMAL LOW (ref >60)
Glucose, Bld: 95 mg/dL (ref 70–99)
Potassium: 3.7 mmol/L (ref 3.5–5.1)
Sodium: 141 mmol/L (ref 135–145)

## 2019-09-22 LAB — CBC
HCT: 44.9 % (ref 36.0–46.0)
Hemoglobin: 14.4 g/dL (ref 12.0–15.0)
MCH: 29.1 pg (ref 26.0–34.0)
MCHC: 32.1 g/dL (ref 30.0–36.0)
MCV: 90.7 fL (ref 80.0–100.0)
Platelets: 216 10*3/uL (ref 150–400)
RBC: 4.95 MIL/uL (ref 3.87–5.11)
RDW: 15.1 % (ref 11.5–15.5)
WBC: 4.7 10*3/uL (ref 4.0–10.5)
nRBC: 0 % (ref 0.0–0.2)

## 2019-09-22 LAB — SARS CORONAVIRUS 2 (TAT 6-24 HRS): SARS Coronavirus 2: NEGATIVE

## 2019-09-22 LAB — ECHOCARDIOGRAM COMPLETE
Height: 65 in
Weight: 3206.4 oz

## 2019-09-22 LAB — NM MYOCAR MULTI W/SPECT W/WALL MOTION / EF
Estimated workload: 1 METS
Exercise duration (min): 5 min
Exercise duration (sec): 15 s
MPHR: 149 {beats}/min
Peak HR: 96 {beats}/min
Percent HR: 64 %
Rest HR: 71 {beats}/min

## 2019-09-22 MED ORDER — METOPROLOL TARTRATE 5 MG/5ML IV SOLN
INTRAVENOUS | Status: AC
  Filled 2019-09-22: qty 5

## 2019-09-22 MED ORDER — REGADENOSON 0.4 MG/5ML IV SOLN
INTRAVENOUS | Status: AC
  Filled 2019-09-22: qty 5

## 2019-09-22 MED ORDER — METOPROLOL TARTRATE 5 MG/5ML IV SOLN
2.5000 mg | Freq: Once | INTRAVENOUS | Status: AC
  Administered 2019-09-22: 2.5 mg via INTRAVENOUS

## 2019-09-22 MED ORDER — PERFLUTREN LIPID MICROSPHERE
1.0000 mL | INTRAVENOUS | Status: AC | PRN
  Administered 2019-09-22: 2 mL via INTRAVENOUS
  Filled 2019-09-22: qty 10

## 2019-09-22 MED ORDER — TECHNETIUM TC 99M TETROFOSMIN IV KIT
30.0000 | PACK | Freq: Once | INTRAVENOUS | Status: AC | PRN
  Administered 2019-09-22: 30 via INTRAVENOUS

## 2019-09-22 MED ORDER — REGADENOSON 0.4 MG/5ML IV SOLN
INTRAVENOUS | Status: AC
  Administered 2019-09-22: 0.4 mg
  Filled 2019-09-22: qty 5

## 2019-09-22 MED ORDER — TECHNETIUM TC 99M TETROFOSMIN IV KIT
10.0000 | PACK | Freq: Once | INTRAVENOUS | Status: AC | PRN
  Administered 2019-09-22: 10 via INTRAVENOUS

## 2019-09-22 MED ORDER — REGADENOSON 0.4 MG/5ML IV SOLN
0.4000 mg | Freq: Once | INTRAVENOUS | Status: DC
  Filled 2019-09-22: qty 5

## 2019-09-22 NOTE — Progress Notes (Signed)
Lexiscan portion of test complete. Patient in no distress. 

## 2019-09-22 NOTE — ED Notes (Signed)
ED TO INPATIENT HANDOFF REPORT  ED Nurse Name and Phone #:   S Name/Age/Gender Claudia Cantrell 73 y.o. female Room/Bed: 011C/011C  Code Status   Code Status: Full Code  Home/SNF/Other Home Patient oriented to: self, place, time and situation Is this baseline? Yes   Triage Complete: Triage complete  Chief Complaint CP AND EKG CHANGES  Triage Note Pt arrives via EMS from PCP office with EKG changes and CP that began Friday. Pt reports off and on right sided CP for the last 2 months. Pain is increased with exertion, having SOB. Vitals 192/120, HR 100 afib, O2 96 %, 97.7. 324mg  ASA. 1 nitro. Some improvement with nitro.   Pt is in Nuclear Med. Will call report and take Pt up whe she returns.   Allergies Allergies  Allergen Reactions  . Labetalol     headaches  . Codeine Anxiety    Level of Care/Admitting Diagnosis ED Disposition    ED Disposition Condition [facility] Area: [place] [[id number]]  Level of Care: Telemetry Cardiac [103]  Covid Evaluation: Asymptomatic Screening Protocol (No Symptoms)  Diagnosis: Persistent atrial fibrillation [facility]) [id number]  Admitting Physician: Buford Dresser [id number]  Attending Physician: Buford Dresser [id number]  Estimated length of stay: past midnight tomorrow  Certification:: I certify this patient will need inpatient services for at least 2 midnights  PT Class (Do Not Modify): Inpatient [101]  PT Acc Code (Do Not Modify): Private [1]       B Medical/Surgery History Past Medical History:  Diagnosis Date  . Arthritis    rheumatoid  . CAD (coronary artery disease)    a. 1992 s/p MI and PTCA of unknown vessel;  b. 09/2010 Cath: LM nl, LAD 20p, LCX 59m, RCA 32m, RPL 20.  Marland Kitchen Cervical cancer ([place]) 1977  . Family history of anesthesia complication    daughter has difficulty waking   . GERD (gastroesophageal reflux disease)    occ  . Gout 10/08/2016  . Hyperlipidemia   .  Hypertensive heart disease   . Hypothyroidism   . Nonischemic cardiomyopathy ([place])    a. 07/2016 Echo: EF 40-45%, mild LVH, inferior akinesis, moderately dilated left atrium, trivial AI and MR.  Marland Kitchen PAF (paroxysmal atrial fibrillation) ([place])    a. 07/2016 Admitted w/ AF RVR-->CHA2DS2VASc = 5-->Eliquis;  b. 07/2016 successful TEE/DCCV.  Marland Kitchen Sleep apnea    a. Using CPAP.   Past Surgical History:  Procedure Laterality Date  . ABDOMINAL HYSTERECTOMY  1988  . BACK SURGERY  1994  . BIOPSY  09/02/2019   Procedure: BIOPSY;  Surgeon: Rush Landmark Telford Nab., MD;  Location: [facility] END[facility];  Service: Gastroenterology;;  . [place]   PTCA BY DR Lamount Cohen  . CARDIOVERSION N/A 08/01/2016   Procedure: CARDIOVERSION;  Surgeon: Lelon Perla, MD;  Location: [facility] ENDOSCOPY;  Service: Cardiovascular;  Laterality: N/A;  . ESOPHAGOGASTRODUODENOSCOPY (EGD) WITH PROPOFOL N/A 09/02/2019   Procedure: ESOPHAGOGASTRODUODENOSCOPY (EGD) WITH PROPOFOL;  Surgeon: Irving Copas., MD;  Location: [facility] ENDOSCOPY;  Service: Gastroenterology;  Laterality: N/A;  . EUS N/A 09/02/2019   Procedure: UPPER ENDOSCOPIC ULTRASOUND (EUS) RADIAL;  Surgeon: Rush Landmark Telford Nab., MD;  Location: [facility] ENDOSCOPY;  Service: Gastroenterology;  Laterality: N/A;  EUS radial/linear  . FINE NEEDLE ASPIRATION  09/02/2019   Procedure: FINE NEEDLE ASPIRATION (FNA) LINEAR;  Surgeon: Irving Copas., MD;  Location: [facility] ENDOSCOPY;  Service: Gastroenterology;;  . ORIF ANKLE FRACTURE Right 04/27/2015   Procedure: OPEN  REDUCTION INTERNAL FIXATION (ORIF) RIGHT BIMALLEOLAR ANKLE FRACTURE WITH SYNDESMOSIS FIXATION;  Surgeon: Leandrew Koyanagi, MD;  Location: [facility];  Service: Orthopedics;  Laterality: Right;  . TEE WITHOUT CARDIOVERSION N/A 08/01/2016   Procedure: TRANSESOPHAGEAL ECHOCARDIOGRAM (TEE);  Surgeon: Lelon Perla, MD;  Location: [facility] ENDOSCOPY;  Service: Cardiovascular;  Laterality: N/A;  . THYROIDECTOMY   11/24/2013   DR Dalbert Batman  . THYROIDECTOMY N/A 11/24/2013   Procedure: TOTAL THYROIDECTOMY;  Surgeon: Adin Hector, MD;  Location: [place];  Service: General;  Laterality: N/A;  . TRANSTHORACIC ECHOCARDIOGRAM  01/15/2013   EF 55% TO 65%. PROBABLE MILD HYPOKINESIS OF THE INFERIOR MYOCARDIUM. GRADE 1 DIASTOLIC DYSFUNCTION. TRIAL AR.LA IS MILDLY DILATED.     A IV Location/Drains/Wounds Patient Lines/Drains/Airways Status   Active Line/Drains/Airways    Name:   Placement date:   Placement time:   Site:   Days:   Peripheral IV 09/21/19 Right Antecubital   09/21/19    1624    Antecubital   1   Incision 11/24/13 Neck Other (Comment)   11/24/13    0816     2128   Incision (Closed) 04/27/15 Ankle Right   04/27/15    1501     1609          Intake/Output Last 24 hours  Intake/Output Summary (Last 24 hours) at 09/22/2019 1403 Last data filed at 09/21/2019 2214 Gross per 24 hour  Intake -  Output 1000 ml  Net -1000 ml    Labs/Imaging Results for orders placed or performed during the hospital encounter of 09/21/19 (from the past 48 hour(s))  Magnesium     Status: Abnormal   Collection Time: 09/21/19  4:21 PM  Result Value Ref Range   Magnesium 2.5 (H) 1.7 - 2.4 mg/dL    Comment: Performed at [facility], [place] [address] [place], [place] [id number]  Basic metabolic panel     Status: Abnormal   Collection Time: 09/21/19  4:23 PM  Result Value Ref Range   Sodium 140 135 - 145 mmol/L   Potassium 4.0 3.5 - 5.1 mmol/L   Chloride 106 98 - 111 mmol/L   CO2 25 22 - 32 mmol/L   Glucose, Bld 89 70 - 99 mg/dL   BUN 12 8 - 23 mg/dL   Creatinine, Ser 0.98 0.44 - 1.00 mg/dL   Calcium 9.7 8.9 - 10.3 mg/dL   GFR calc non Af Amer 58 (L) >60 mL/min   GFR calc Af Amer >60 >60 mL/min   Anion gap 9 5 - 15    Comment: Performed at [facility] [address] [place], [place] [address]  CBC     Status: Abnormal   Collection Time: 09/21/19  4:23 PM  Result Value Ref Range   WBC 4.4 4.0  - 10.5 K/uL   RBC 5.45 (H) 3.87 - 5.11 MIL/uL   Hemoglobin 15.7 (H) 12.0 - 15.0 g/dL   HCT 49.8 (H) 36.0 - 46.0 %   MCV 91.4 80.0 - 100.0 fL   MCH 28.8 26.0 - 34.0 pg   MCHC 31.5 30.0 - 36.0 g/dL   RDW 15.3 11.5 - 15.5 %   Platelets 246 150 - 400 K/uL   nRBC 0.0 0.0 - 0.2 %    Comment: Performed at [facility], [place] [address], [place], [place] [address]  Troponin I (High Sensitivity)     Status: None   Collection Time: 09/21/19  4:23 PM  Result Value  Ref Range   Troponin I (High Sensitivity) 9 <18 ng/L    Comment: (NOTE) Elevated high sensitivity troponin I (hsTnI) values and significant  changes across serial measurements may suggest ACS but many other  chronic and acute conditions are known to elevate hsTnI results.  Refer to the "Links" section for chest pain algorithms and additional  guidance. Performed at [facility], [place] [address], [place], [place] [address]   Brain natriuretic peptide     Status: Abnormal   Collection Time: 09/21/19  4:23 PM  Result Value Ref Range   B Natriuretic Peptide 858.8 (H) 0.0 - 100.0 pg/mL    Comment: Performed at [facility] [address], [place], [place] [address]  SARS CORONAVIRUS 2 (TAT 6-24 HRS) Nasopharyngeal Nasopharyngeal Swab     Status: None   Collection Time: 09/21/19  7:00 PM   Specimen: Nasopharyngeal Swab  Result Value Ref Range   SARS Coronavirus 2 NEGATIVE NEGATIVE    Comment: (NOTE) SARS-CoV-2 target nucleic acids are NOT DETECTED. The SARS-CoV-2 RNA is generally detectable in upper and lower respiratory specimens during the acute phase of infection. Negative results do not preclude SARS-CoV-2 infection, do not rule out co-infections with other pathogens, and should not be used as the sole basis for treatment or other patient management decisions. Negative results must be combined with clinical observations, patient history, and epidemiological information. The expected result is Negative. Fact  Sheet for Patients: SugarRoll.be Fact Sheet for Healthcare Providers: https://www.woods-mathews.com/ This test is not yet approved or cleared by the [place] FDA and  has been authorized for detection and/or diagnosis of SARS-CoV-2 by FDA under an Emergency Use Authorization (EUA). This EUA will remain  in effect (meaning this test can be used) for the duration of the COVID-19 declaration under Section 56 4(b)(1) of the Act, 21 U.S.C. section 360bbb-3(b)(1), unless the authorization is terminated or revoked sooner. Performed at [facility], [place] [address] [place], [place] [address]   Troponin I (High Sensitivity)     Status: None   Collection Time: 09/21/19  7:00 PM  Result Value Ref Range   Troponin I (High Sensitivity) 9 <18 ng/L    Comment: (NOTE) Elevated high sensitivity troponin I (hsTnI) values and significant  changes across serial measurements may suggest ACS but many other  chronic and acute conditions are known to elevate hsTnI results.  Refer to the "Links" section for chest pain algorithms and additional  guidance. Performed at [facility], [place] [address], [place], [place] [id number]   Basic metabolic panel     Status: Abnormal   Collection Time: 09/22/19  2:26 AM  Result Value Ref Range   Sodium 141 135 - 145 mmol/L   Potassium 3.7 3.5 - 5.1 mmol/L   Chloride 105 98 - 111 mmol/L   CO2 25 22 - 32 mmol/L   Glucose, Bld 95 70 - 99 mg/dL   BUN 12 8 - 23 mg/dL   Creatinine, Ser 0.96 0.44 - 1.00 mg/dL   Calcium 9.0 8.9 - 10.3 mg/dL   GFR calc non Af Amer 60 (L) >60 mL/min   GFR calc Af Amer >60 >60 mL/min   Anion gap 11 5 - 15    Comment: Performed at [facility], [place] [address], [place], [place] [address]  Lipid panel     Status: Abnormal   Collection Time: 09/22/19  2:26 AM  Result Value Ref Range   Cholesterol 114  0 - 200 mg/dL   Triglycerides 94 <150 mg/dL   HDL 34 (L) >40 mg/dL   Total  CHOL/HDL Ratio 3.4 RATIO   VLDL 19 0 - 40 mg/dL   LDL Cholesterol 61 0 - 99 mg/dL    Comment:        Total Cholesterol/HDL:CHD Risk Coronary Heart Disease Risk Table                     Men   Women  1/2 Average Risk   3.4   3.3  Average Risk       5.0   4.4  2 X Average Risk   9.6   7.1  3 X Average Risk  23.4   11.0        Use the calculated Patient Ratio above and the CHD Risk Table to determine the patient's CHD Risk.        ATP III CLASSIFICATION (LDL):  <100     mg/dL   Optimal  100-129  mg/dL   Near or Above                    Optimal  130-159  mg/dL   Borderline  160-189  mg/dL   High  >190     mg/dL   Very High Performed at [facility] [address], [place], [place] [address]   CBC     Status: None   Collection Time: 09/22/19  2:26 AM  Result Value Ref Range   WBC 4.7 4.0 - 10.5 K/uL   RBC 4.95 3.87 - 5.11 MIL/uL   Hemoglobin 14.4 12.0 - 15.0 g/dL   HCT 44.9 36.0 - 46.0 %   MCV 90.7 80.0 - 100.0 fL   MCH 29.1 26.0 - 34.0 pg   MCHC 32.1 30.0 - 36.0 g/dL   RDW 15.1 11.5 - 15.5 %   Platelets 216 150 - 400 K/uL   nRBC 0.0 0.0 - 0.2 %    Comment: Performed at [facility], [place] [address], [place], [place] [address]   Dg Chest 2 View  Result Date: 09/21/2019 CLINICAL DATA:  Chest pain EXAM: CHEST - 2 VIEW COMPARISON:  Chest radiograph dated 07/29/2016 FINDINGS: The heart remains enlarged. The lungs are clear. Degenerative changes are seen in the spine. IMPRESSION: Cardiomegaly.  Clear lungs. Electronically Signed   By: Zerita Boers M.D.   On: 09/21/2019 14:35    Pending Labs Unresulted Labs (From admission, onward)   None      Vitals/Pain Today's Vitals   09/22/19 1259 09/22/19 1317 09/22/19 1319 09/22/19 1321  BP: 116/69 133/65 109/74 107/75  Pulse: 86  87 83  Resp:      Temp:      TempSrc:      SpO2: 97% 96% 97% 99%  Weight:      Height:      PainSc:        Isolation Precautions No active isolations  Medications Medications   sodium chloride flush (NS) 0.9 % injection 3 mL (has no administration in time range)  acetaminophen (TYLENOL) tablet 650 mg (has no administration in time range)  amLODipine (NORVASC) tablet 10 mg (10 mg Oral Given 09/22/19 1101)  rosuvastatin (CRESTOR) tablet 40 mg (40 mg Oral Given 09/22/19 1115)  irbesartan (AVAPRO) tablet 300 mg (300 mg Oral Given 09/22/19 1109)  levothyroxine (SYNTHROID) tablet 112 mcg (112 mcg Oral Given 09/22/19 1059)  apixaban (ELIQUIS) tablet 5 mg (  5 mg Oral Given 09/22/19 1109)  pantoprazole (PROTONIX) EC tablet 80 mg (80 mg Oral Given 09/22/19 1058)  metroNIDAZOLE (FLAGYL) tablet 250 mg (250 mg Oral Given 09/22/19 1110)  carvedilol (COREG) tablet 25 mg (25 mg Oral Given 09/22/19 1100)  ondansetron (ZOFRAN) injection 4 mg (has no administration in time range)  hydrALAZINE (APRESOLINE) tablet 10 mg (10 mg Oral Given 09/22/19 1115)  isosorbide dinitrate (ISORDIL) tablet 5 mg (5 mg Oral Given 09/22/19 1114)  regadenoson (LEXISCAN) injection SOLN 0.4 mg (0.4 mg Intravenous Not Given 09/22/19 1008)  regadenoson (LEXISCAN) 0.4 MG/5ML injection SOLN (has no administration in time range)  metoprolol tartrate (LOPRESSOR) 5 MG/5ML injection (has no administration in time range)  amLODipine (NORVASC) tablet 10 mg (10 mg Oral Given 09/21/19 1800)  prochlorperazine (COMPAZINE) injection 5 mg (5 mg Intravenous Given 09/21/19 1815)  diphenhydrAMINE (BENADRYL) injection 12.5 mg (12.5 mg Intravenous Given 09/21/19 1811)  acetaminophen (TYLENOL) tablet 1,000 mg (1,000 mg Oral Given 09/21/19 1759)  sodium chloride flush (NS) 0.9 % injection 3 mL (3 mLs Intravenous Given 09/21/19 2044)  technetium tetrofosmin (TC-MYOVIEW) injection 10 millicurie (10 millicuries Intravenous Contrast Given 09/22/19 0821)  metoprolol tartrate (LOPRESSOR) injection 2.5 mg (2.5 mg Intravenous Given 09/22/19 0957)  regadenoson (LEXISCAN) 0.4 MG/5ML injection SOLN (0.4 mg  Given 09/22/19 1315)     Mobility walks Low fall risk   Focused Assessments Cardiac Assessment Handoff:  Cardiac Rhythm: Atrial fibrillation Lab Results  Component Value Date   TROPONINI <0.03 07/30/2016   No results found for: DDIMER Does the Patient currently have chest pain? No     R Recommendations: See Admitting Provider Note  Report given to:   Additional Notes:

## 2019-09-22 NOTE — Progress Notes (Signed)
   Claudia Cantrell presented for a nuclear stress test today.  No immediate complications.  Stress imaging is pending at this time.  Preliminary EKG findings may be listed in the chart, but the stress test result will not be finalized until perfusion imaging is complete.  1 day study, [facility] radiology to read.  Rosaria Ferries, PA-C 09/22/2019, 2:25 PM

## 2019-09-22 NOTE — Progress Notes (Signed)
After talks with PA and RN patient now feeling more comfortable about proceeding with test. Patient wants to attempt to undergo procedure.

## 2019-09-22 NOTE — Progress Notes (Signed)
Progress Note  Patient Name: Claudia Cantrell Date of Encounter: 09/22/2019  Primary Cardiologist: Shelva Majestic, MD  Subjective   No more chest pain, SOB is improved. Anxious about her BP being high. Concerned that she is not breathing well enough to have a stress test. Can feel the Afib, even when HR controlled.  Inpatient Medications    Scheduled Meds: . amLODipine  10 mg Oral Q1500  . apixaban  5 mg Oral BID  . carvedilol  25 mg Oral BID WC  . furosemide  40 mg Intravenous BID  . hydrALAZINE  10 mg Oral TID  . irbesartan  300 mg Oral Daily  . isosorbide dinitrate  5 mg Oral TID  . levothyroxine  112 mcg Oral QAC breakfast  . metroNIDAZOLE  250 mg Oral TID  . pantoprazole  80 mg Oral Daily  . regadenoson  0.4 mg Intravenous Once  . rosuvastatin  40 mg Oral Daily  . sodium chloride flush  3 mL Intravenous Once   Continuous Infusions:  PRN Meds: acetaminophen, ondansetron (ZOFRAN) IV   Vital Signs    Vitals:   09/22/19 0500 09/22/19 0600 09/22/19 0700 09/22/19 0900  BP: 119/80 115/77 138/86 (!) 142/99  Pulse: 73 77 78   Resp: 12 13 13    Temp:      TempSrc:      SpO2: 96% 97% 96%   Weight:      Height:        Intake/Output Summary (Last 24 hours) at 09/22/2019 0916 Last data filed at 09/21/2019 2214 Gross per 24 hour  Intake -  Output 1000 ml  Net -1000 ml   Filed Weights   09/21/19 1603  Weight: 93 kg   Last Weight  Most recent update: 09/21/2019  4:03 PM   Weight  93 kg (205 lb)           Weight change:    Telemetry    Atrial fib, rate generally controlled - Personally Reviewed  ECG    None today - Personally Reviewed  Physical Exam   General: Well developed, well nourished, female appearing in no acute distress except mild SOB Head: Normocephalic, atraumatic.  Neck: Supple without bruits, JVD minimal elevation. Lungs:  Resp regular and unlabored, decreased BS bases Heart: Irreg R&R, S1, S2, no S3, S4, or murmur; no rub. Abdomen:  Soft, non-tender, non-distended with normoactive bowel sounds. No hepatomegaly. No rebound/guarding. No obvious abdominal masses. Extremities: No clubbing, cyanosis, no edema. Distal pedal pulses are 2+ bilaterally. Neuro: Alert and oriented X 3. Moves all extremities spontaneously. Psych: Normal affect.  Labs    Hematology Recent Labs  Lab 09/21/19 1623 09/22/19 0226  WBC 4.4 4.7  RBC 5.45* 4.95  HGB 15.7* 14.4  HCT 49.8* 44.9  MCV 91.4 90.7  MCH 28.8 29.1  MCHC 31.5 32.1  RDW 15.3 15.1  PLT 246 216    Chemistry Recent Labs  Lab 09/21/19 1623 09/22/19 0226  NA 140 141  K 4.0 3.7  CL 106 105  CO2 25 25  GLUCOSE 89 95  BUN 12 12  CREATININE 0.98 0.96  CALCIUM 9.7 9.0  GFRNONAA 58* 60*  GFRAA >60 >60  ANIONGAP 9 11     High Sensitivity Troponin:   Recent Labs  Lab 09/21/19 1623 09/21/19 1900  TROPONINIHS 9 9     BNP Recent Labs  Lab 09/21/19 1623  BNP 858.8*    Lab Results  Component Value Date   CHOL 114 09/22/2019  HDL 34 (L) 09/22/2019   LDLCALC 61 09/22/2019   TRIG 94 09/22/2019   CHOLHDL 3.4 09/22/2019     Radiology    Dg Chest 2 View  Result Date: 09/21/2019 CLINICAL DATA:  Chest pain EXAM: CHEST - 2 VIEW COMPARISON:  Chest radiograph dated 07/29/2016 FINDINGS: The heart remains enlarged. The lungs are clear. Degenerative changes are seen in the spine. IMPRESSION: Cardiomegaly.  Clear lungs. Electronically Signed   By: Zerita Boers M.D.   On: 09/21/2019 14:35     Cardiac Studies   ECHO:  Ordered MYOVIEW: ordered  Patient Profile     73 y.o. female w/ hx PTCA 1991, 1992, HTN, HLD, OSA on CPAP, PAF, was admitted 10/12 w/ CP, SOB & Afib. +Volume overload, HS trop normal.  Assessment & Plan    1. Chest pain, moderate risk CAD:  - Ez have been negative, ECG was not acute - Myoview when pt comfortable w/ the procedure  2. Acute CHF - Echo ordered to determine type  3. Persistent Afib - pt very aware of Afib - consider  TEE/DCCV to get her back in SR - F/U on echo for atrial size to see if anti-arrhythmic might be needed.  4. HTN:  - BP is up right now, has not had am meds - will give a dose of IV Lopressor to bring down BP now - continue Rx  Active Problems:   Persistent atrial fibrillation ([place])    Signed, Rosaria Ferries , PA-C 9:16 AM 09/22/2019 Pager: [phone]

## 2019-09-22 NOTE — Progress Notes (Signed)
  Echocardiogram 2D Echocardiogram has been performed.  Claudia Cantrell [name] 09/22/2019, 5:07 PM

## 2019-09-22 NOTE — ED Notes (Signed)
Patient transported to have Stress Test done.

## 2019-09-22 NOTE — ED Triage Notes (Signed)
Report called to 3E

## 2019-09-22 NOTE — Progress Notes (Signed)
Patient having extreme concerns about safety of test because of her condition and her current ED visit. Patient in afib/ a flutter currently. EMS called from PCP office for afib rvr. Patient has been receiving lasix for what she states is fluid around her heart. She does not feel safe receiving lexiscan medication or proceeding with this exam due to these factors and wants to reschedule for a later date. Patient would like RN to advocate on her behalf to MD. RN also assures patient that nothing happens to patient and no procedure will be done without her permission.

## 2019-09-22 NOTE — ED Triage Notes (Signed)
Pt is in Nuclear Med. Will call report and take Pt up whe she returns.

## 2019-09-22 NOTE — Plan of Care (Signed)
  Problem: Education: Goal: Ability to demonstrate management of disease process will improve Outcome: Progressing Goal: Ability to verbalize understanding of medication therapies will improve Outcome: Progressing Goal: Individualized Educational Video(s) Outcome: Progressing   Problem: Activity: Goal: Capacity to carry out activities will improve Outcome: Progressing   Problem: Cardiac: Goal: Ability to achieve and maintain adequate cardiopulmonary perfusion will improve Outcome: Progressing   Problem: Education: Goal: Knowledge of disease or condition will improve Outcome: Progressing Goal: Understanding of medication regimen will improve Outcome: Progressing Goal: Individualized Educational Video(s) Outcome: Progressing   Problem: Activity: Goal: Ability to tolerate increased activity will improve Outcome: Progressing   Problem: Cardiac: Goal: Ability to achieve and maintain adequate cardiopulmonary perfusion will improve Outcome: Progressing   Problem: Health Behavior/Discharge Planning: Goal: Ability to safely manage health-related needs after discharge will improve Outcome: Progressing   Problem: Education: Goal: Knowledge of General Education information will improve Description: Including pain rating scale, medication(s)/side effects and non-pharmacologic comfort measures Outcome: Progressing   Problem: Health Behavior/Discharge Planning: Goal: Ability to manage health-related needs will improve Outcome: Progressing   Problem: Clinical Measurements: Goal: Ability to maintain clinical measurements within normal limits will improve Outcome: Progressing Goal: Will remain free from infection Outcome: Progressing Goal: Diagnostic test results will improve Outcome: Progressing Goal: Respiratory complications will improve Outcome: Progressing Goal: Cardiovascular complication will be avoided Outcome: Progressing   Problem: Activity: Goal: Risk for activity  intolerance will decrease Outcome: Progressing   Problem: Nutrition: Goal: Adequate nutrition will be maintained Outcome: Progressing   Problem: Coping: Goal: Level of anxiety will decrease Outcome: Progressing   Problem: Elimination: Goal: Will not experience complications related to bowel motility Outcome: Progressing Goal: Will not experience complications related to urinary retention Outcome: Progressing   Problem: Pain Managment: Goal: General experience of comfort will improve Outcome: Progressing   Problem: Safety: Goal: Ability to remain free from injury will improve Outcome: Progressing   Problem: Skin Integrity: Goal: Risk for impaired skin integrity will decrease Outcome: Progressing   

## 2019-09-23 ENCOUNTER — Encounter (HOSPITAL_COMMUNITY): Payer: Self-pay | Admitting: General Practice

## 2019-09-23 DIAGNOSIS — I5041 Acute combined systolic (congestive) and diastolic (congestive) heart failure: Secondary | ICD-10-CM

## 2019-09-23 DIAGNOSIS — I110 Hypertensive heart disease with heart failure: Principal | ICD-10-CM

## 2019-09-23 LAB — BASIC METABOLIC PANEL
Anion gap: 11 (ref 5–15)
BUN: 19 mg/dL (ref 8–23)
CO2: 22 mmol/L (ref 22–32)
Calcium: 9.1 mg/dL (ref 8.9–10.3)
Chloride: 105 mmol/L (ref 98–111)
Creatinine, Ser: 1.26 mg/dL — ABNORMAL HIGH (ref 0.44–1.00)
GFR calc Af Amer: 50 mL/min — ABNORMAL LOW (ref >60)
GFR calc non Af Amer: 43 mL/min — ABNORMAL LOW (ref >60)
Glucose, Bld: 130 mg/dL — ABNORMAL HIGH (ref 70–99)
Potassium: 3.3 mmol/L — ABNORMAL LOW (ref 3.5–5.1)
Sodium: 138 mmol/L (ref 135–145)

## 2019-09-23 LAB — CBC
HCT: 42.3 % (ref 36.0–46.0)
Hemoglobin: 13.6 g/dL (ref 12.0–15.0)
MCH: 28.6 pg (ref 26.0–34.0)
MCHC: 32.2 g/dL (ref 30.0–36.0)
MCV: 89.1 fL (ref 80.0–100.0)
Platelets: 243 10*3/uL (ref 150–400)
RBC: 4.75 MIL/uL (ref 3.87–5.11)
RDW: 15.1 % (ref 11.5–15.5)
WBC: 3.7 10*3/uL — ABNORMAL LOW (ref 4.0–10.5)
nRBC: 0 % (ref 0.0–0.2)

## 2019-09-23 LAB — MAGNESIUM: Magnesium: 2.1 mg/dL (ref 1.7–2.4)

## 2019-09-23 MED ORDER — HYDRALAZINE HCL 10 MG PO TABS: 10.0000 mg | ORAL_TABLET | Freq: Three times a day (TID) | ORAL | 11 refills | Status: DC

## 2019-09-23 MED ORDER — ISOSORBIDE DINITRATE 5 MG PO TABS: 5.0000 mg | ORAL_TABLET | Freq: Three times a day (TID) | ORAL | 11 refills | Status: DC

## 2019-09-23 MED ORDER — CARVEDILOL 25 MG PO TABS: 25.0000 mg | ORAL_TABLET | Freq: Two times a day (BID) | ORAL | 11 refills | Status: DC

## 2019-09-23 NOTE — Plan of Care (Signed)
  Problem: Education: Goal: Ability to demonstrate management of disease process will improve Outcome: Adequate for Discharge Goal: Ability to verbalize understanding of medication therapies will improve Outcome: Adequate for Discharge Goal: Individualized Educational Video(s) Outcome: Adequate for Discharge   Problem: Activity: Goal: Capacity to carry out activities will improve Outcome: Adequate for Discharge   Problem: Cardiac: Goal: Ability to achieve and maintain adequate cardiopulmonary perfusion will improve Outcome: Adequate for Discharge   Problem: Education: Goal: Knowledge of disease or condition will improve Outcome: Adequate for Discharge Goal: Understanding of medication regimen will improve Outcome: Adequate for Discharge Goal: Individualized Educational Video(s) Outcome: Adequate for Discharge   Problem: Activity: Goal: Ability to tolerate increased activity will improve Outcome: Adequate for Discharge   Problem: Cardiac: Goal: Ability to achieve and maintain adequate cardiopulmonary perfusion will improve Outcome: Adequate for Discharge   Problem: Health Behavior/Discharge Planning: Goal: Ability to safely manage health-related needs after discharge will improve Outcome: Adequate for Discharge   Problem: Education: Goal: Knowledge of General Education information will improve Description: Including pain rating scale, medication(s)/side effects and non-pharmacologic comfort measures Outcome: Adequate for Discharge   Problem: Health Behavior/Discharge Planning: Goal: Ability to manage health-related needs will improve Outcome: Adequate for Discharge   Problem: Clinical Measurements: Goal: Ability to maintain clinical measurements within normal limits will improve Outcome: Adequate for Discharge Goal: Will remain free from infection Outcome: Adequate for Discharge Goal: Diagnostic test results will improve Outcome: Adequate for Discharge Goal:  Respiratory complications will improve Outcome: Adequate for Discharge Goal: Cardiovascular complication will be avoided Outcome: Adequate for Discharge   Problem: Activity: Goal: Risk for activity intolerance will decrease Outcome: Adequate for Discharge   Problem: Nutrition: Goal: Adequate nutrition will be maintained Outcome: Adequate for Discharge   Problem: Coping: Goal: Level of anxiety will decrease Outcome: Adequate for Discharge   Problem: Elimination: Goal: Will not experience complications related to bowel motility Outcome: Adequate for Discharge Goal: Will not experience complications related to urinary retention Outcome: Adequate for Discharge   Problem: Pain Managment: Goal: General experience of comfort will improve Outcome: Adequate for Discharge   Problem: Safety: Goal: Ability to remain free from injury will improve Outcome: Adequate for Discharge   Problem: Skin Integrity: Goal: Risk for impaired skin integrity will decrease Outcome: Adequate for Discharge

## 2019-09-23 NOTE — Plan of Care (Signed)
°  Problem: Education: °Goal: Ability to demonstrate management of disease process will improve °Outcome: Progressing °Goal: Ability to verbalize understanding of medication therapies will improve °Outcome: Progressing °Goal: Individualized Educational Video(s) °Outcome: Progressing °  °

## 2019-09-23 NOTE — Progress Notes (Signed)
PIV has been removed. Tele has been removed and [facility] informed of patient's pending discharge. Discharge instructions given to patient she verbalized understanding of instructions given. Patient instructed call for her ride for discharge home.

## 2019-09-23 NOTE — Discharge Instructions (Addendum)
Heart Failure Education: 1. Weigh yourself EVERY morning after you go to the bathroom but before you eat or drink anything. Write this number down in a weight log/diary. If you gain 3 pounds overnight or 5 pounds in a week, call the office. 2. Take your medicines as prescribed. If you have concerns about your medications, please call us before you stop taking them.  3. Eat low salt foods--Limit salt (sodium) to 2000 mg per day. This will help prevent your body from holding onto fluid. Read food labels as many processed foods have a lot of sodium, especially canned goods and prepackaged meats. If you would like some assistance choosing low sodium foods, we would be happy to set you up with a nutritionist. 4. Stay as active as you can everyday. Staying active will give you more energy and make your muscles stronger. Start with 5 minutes at a time and work your way up to 30 minutes a day. Break up your activities--do some in the morning and some in the afternoon. Start with 3 days per week and work your way up to 5 days as you can.  If you have chest pain, feel short of breath, dizzy, or lightheaded, STOP. If you don't feel better after a short rest, call 911. If you do feel better, call the office to let us know you have symptoms with exercise. 5. Limit all fluids for the day to less than 2 liters. Fluid includes all drinks, coffee, juice, ice chips, soup, jello, and all other liquids.  Information on my medicine - ELIQUIS (apixaban)  Why was Eliquis prescribed for you? Eliquis was prescribed for you to reduce the risk of a blood clot forming that can cause a stroke if you have a medical condition called atrial fibrillation (a type of irregular heartbeat).  What do You need to know about Eliquis ? Take your Eliquis TWICE DAILY - one tablet in the morning and one tablet in the evening with or without food. If you have difficulty swallowing the tablet whole please discuss with your pharmacist how to  take the medication safely.  Take Eliquis exactly as prescribed by your doctor and DO NOT stop taking Eliquis without talking to the doctor who prescribed the medication.  Stopping may increase your risk of developing a stroke.  Refill your prescription before you run out.  After discharge, you should have regular check-up appointments with your healthcare provider that is prescribing your Eliquis.  In the future your dose may need to be changed if your kidney function or weight changes by a significant amount or as you get older.  What do you do if you miss a dose? If you miss a dose, take it as soon as you remember on the same day and resume taking twice daily.  Do not take more than one dose of ELIQUIS at the same time to make up a missed dose.  Important Safety Information A possible side effect of Eliquis is bleeding. You should call your healthcare provider right away if you experience any of the following: ? Bleeding from an injury or your nose that does not stop. ? Unusual colored urine (red or dark brown) or unusual colored stools (red or black). ? Unusual bruising for unknown reasons. ? A serious fall or if you hit your head (even if there is no bleeding).  Some medicines may interact with Eliquis and might increase your risk of bleeding or clotting while on Eliquis. To help avoid this, consult your   healthcare provider or pharmacist prior to using any new prescription or non-prescription medications, including herbals, vitamins, non-steroidal anti-inflammatory drugs (NSAIDs) and supplements.  This website has more information on Eliquis (apixaban): http://www.eliquis.com/eliquis/home 

## 2019-09-23 NOTE — Discharge Summary (Addendum)
Discharge Summary    Patient ID: Claudia Cantrell MRN: [id number]; DOB: Oct 13, 1946  Admit date: 09/21/2019 Discharge date: 09/23/2019  Primary Care Provider: Girtha Rm, NP-C  Primary Cardiologist: Shelva Majestic, MD  Primary Electrophysiologist:  None   Discharge Diagnoses    Principal Problem:   Acute combined systolic and diastolic CHF, NYHA class 3 ([facility]) Active Problems:   Hypertensive heart disease   Hyperlipidemia   Persistent atrial fibrillation ([facility])   Acute CHF (congestive heart failure) (HCC)   Allergies Allergies  Allergen Reactions   Labetalol     headaches   Codeine Anxiety    Diagnostic Studies/Procedures    Echocardiogram 09/22/2019: 1. Left ventricular ejection fraction, by visual estimation, is 45 to 50%. The left ventricle has mildly reduced function. Normal size. There is moderately increased left ventricular hypertrophy. Basal to mid inferior/inferolateral hypokinesis  2. Left ventricular diastolic Doppler parameters are indeterminate pattern of LV diastolic filling.  3. Global right ventricle has normal systolic function.The right ventricular size is normal. No increase in right ventricular wall thickness.  4. Left atrial size was moderately dilated.  5. Right atrial size was normal.  6. Trivial pericardial effusion is present.  7. The mitral valve is normal in structure. Mild mitral valve regurgitation.  8. The tricuspid valve is normal in structure. Tricuspid valve regurgitation was not visualized by color flow Doppler.  9. The aortic valve is tricuspid Aortic valve regurgitation is mild by color flow Doppler. 10. Normal pulmonary artery systolic pressure. 11. The inferior vena cava is normal in size with greater than 50% respiratory variability, suggesting right atrial pressure of 3 mmHg.  NST 09/23/2019:  1. Large infarction involving the lateral wall and inferolateral wall with a rim of peri-infarct ischemia.  Small region of moderate  reversibility within the apical segment of the anterior septal wall concerning for ischemia.  2. Global hypokinesia.  3. Left ventricular ejection fraction 34%  4. Non invasive risk stratification*: High (risk stratification primary based on low ejection fraction).  OVER READ by Dr. Harrell Gave: No significant change from previous. Known fixed defect. No ischemia.   _____________   History of Present Illness     Claudia Cantrell is a 73 y.o. female with CAD s/p prior PTCA 1991 and 1992, HTN, HLD, OSA on CPAP. Ms. Richards reports that she has had progressive shortness of breath and generally feeling worse over the last few months. This has coincided with rising blood pressures. She believes she has been in afib for several months. Over the last few days she has had worsening shortness of breath and chest tightness with minimal exertion. She endorses orthopnea and LE edema. Her weight has intentionally been going down from 230 lbs to 205 lbs with diet changes.   At the time of admission she was visibly short of breath just getting to bedside commode. Her O2 sats were 89%, increasing to 96% with nasal cannula. She is conversationally dyspneic.  She has felt warm but no clear fevers/chills. Had negative Covid prior to recent endoscopy. Had to hold her apixaban before and after endoscopy.  Chest pressure is central, exertional but also positional, does not radiate, related to shortness of breath. Has been gradually worsening, now occasionally at rest/lying down. Nothing else clearly makes it better/worse. Has been using her CPAP more often to try to help her breathing/chest pressure  ER evaluation notable for unremarkable CXR, initial Tn 9. Telemetry shows afib with rates to 120s with exertion.   Hospital Course  Consultants: None   1. Acute combined CHF: patient presented with progressive SOB, chest tightness, orthopnea, and LE edema. Echo with EF 45-50%, indeterminate LV diastolic  function due to afib, moderate LVH, basal to mid inferior/inferolateral hypokinesis, moderate LAE, and mild MR/AI. She was diuresed with IV lasix with weight 201lbs on the day of discharge. She was started on carvedilol 25mg  BID, hydralazine 10mg  TID, and isosorbide dinitrate 5mg  TID, in addition to home olmesartan - Continue above medications.   2. Persistent atrial fibrillation with RVR: HR in the 120s on admission, improved with transition from labetalol to carvedilol for rate control. Home eliquis was continued. TEE/DCCV was discussed this admission, however patient is anticipating additional GI work-up with possible biopsy in the near future. Decision made to wait until work up completed to pursue cardioversion as she will need to be on uninterrupted anticoagulation x1 month after DCCV.  - Continue carvedilol for rate control - Continue eliquis for stroke ppx. Okay to hold eliquis for upcoming GI work-up if necessary. - Further discussion for TEE/DCCV vs DCCV outpatient   3. Chest pain in patient with CAD s/p PTCA in 1991/1992: patient presented with chest pain in the setting of #1/2. HsTrop 9 x2. EKG with atrial fibrillation with RVR. She underwent a NST with over read by Dr. Harrell Gave revealing fixed defect without significant change from previous and no ischemia. Not on aspirin given need for anticoagulation. Chest pain resolved prior to discharge - Continue statin  4.  HTN: BP difficult to control. She was started on  carvedilol 25mg  BID, hydralazine 10mg  TID, and isosorbide dinitrate 5mg  TID, in addition to home amlodipine 10mg  daily and olmesartan 40mg  daily. BP stable on the day of discharge - Continue above medications.  4. HLD: LDL 61 this admission - Continue rosuvastatin  Did the patient have an acute coronary syndrome (MI, NSTEMI, STEMI, etc) this admission?:  No                               Did the patient have a percutaneous coronary intervention (stent / angioplasty)?:  No.     _____________  Discharge Vitals Blood pressure 118/75, pulse 75, temperature 98.7 F (37.1 C), temperature source Oral, resp. rate 16, height 5\' 5"  (1.651 m), weight 91.5 kg, SpO2 98 %.  Filed Weights   09/21/19 1603 09/22/19 1454 09/23/19 0535  Weight: 93 kg 90.9 kg 91.5 kg    Labs & Radiologic Studies    CBC Recent Labs    09/22/19 0226 09/23/19 1038  WBC 4.7 3.7*  HGB 14.4 13.6  HCT 44.9 42.3  MCV 90.7 89.1  PLT 216 0000000   Basic Metabolic Panel Recent Labs    09/21/19 1621  09/22/19 0226 09/23/19 1038  NA  --    < > 141 138  K  --    < > 3.7 3.3*  CL  --    < > 105 105  CO2  --    < > 25 22  GLUCOSE  --    < > 95 130*  BUN  --    < > 12 19  CREATININE  --    < > 0.96 1.26*  CALCIUM  --    < > 9.0 9.1  MG 2.5*  --   --  2.1   < > = values in this interval not displayed.   Liver Function Tests No results for input(s): AST, ALT, ALKPHOS,  BILITOT, PROT, ALBUMIN in the last 72 hours. No results for input(s): LIPASE, AMYLASE in the last 72 hours. High Sensitivity Troponin:   Recent Labs  Lab 09/21/19 1623 09/21/19 1900  TROPONINIHS 9 9    BNP Invalid input(s): POCBNP D-Dimer No results for input(s): DDIMER in the last 72 hours. Hemoglobin A1C No results for input(s): HGBA1C in the last 72 hours. Fasting Lipid Panel Recent Labs    09/22/19 0226  CHOL 114  HDL 34*  LDLCALC 61  TRIG 94  CHOLHDL 3.4   Thyroid Function Tests No results for input(s): TSH, T4TOTAL, T3FREE, THYROIDAB in the last 72 hours.  Invalid input(s): FREET3 _____________  Dg Chest 2 View  Result Date: 09/21/2019 CLINICAL DATA:  Chest pain EXAM: CHEST - 2 VIEW COMPARISON:  Chest radiograph dated 07/29/2016 FINDINGS: The heart remains enlarged. The lungs are clear. Degenerative changes are seen in the spine. IMPRESSION: Cardiomegaly.  Clear lungs. Electronically Signed   By: Zerita Boers M.D.   On: 09/21/2019 14:35   Nm Myocar Multi W/spect W/wall Motion / Ef  Result Date:  09/22/2019 CLINICAL DATA:  73 year old female with history of congestive heart failure and atrial fibrillation. Coronary artery disease. Chest pain EXAM: MYOCARDIAL IMAGING WITH SPECT (REST AND PHARMACOLOGIC-STRESS) GATED LEFT VENTRICULAR WALL MOTION STUDY LEFT VENTRICULAR EJECTION FRACTION TECHNIQUE: Standard myocardial SPECT imaging was performed after resting intravenous injection of 10 mCi Tc-19m tetrofosmin. Subsequently, intravenous infusion of Lexiscan was performed under the supervision of the Cardiology staff. At peak effect of the drug, 30 mCi Tc-24m tetrofosmin was injected intravenously and standard myocardial SPECT imaging was performed. Quantitative gated imaging was also performed to evaluate left ventricular wall motion, and estimate left ventricular ejection fraction. COMPARISON:  Nuclear medicine perfusion scan 01/26/2009 FINDINGS: Perfusion: Large region severe decreased counts within the apical mid and basilar segment of the lateral wall inferolateral wall which are fixed on rest and stress. There is a small rim of reversibility along the margins of this large fixed defect. Small region of moderate decreased counts within the apical segment of the anterior septal wall. Wall Motion: Global hypokinesia.  Mild LEFT ventricular dilatation. Left Ventricular Ejection Fraction: 34 % End diastolic volume 123XX123 ml End systolic volume 99991111 ml IMPRESSION: 1. Large infarction involving the lateral wall and inferolateral wall with a rim of peri-infarct ischemia. Small region of moderate reversibility within the apical segment of the anterior septal wall concerning for ischemia. 2. Global hypokinesia. 3. Left ventricular ejection fraction 34% 4. Non invasive risk stratification*: High (risk stratification primary based on low ejection fraction). *2012 Appropriate Use Criteria for Coronary Revascularization Focused Update: J Am Coll Cardiol. [id number].  http://content.[email or web address].aspx?articleid=[email or web address] These results will be called to the ordering clinician or representative by the Radiologist Assistant, and communication documented in the PACS or zVision Dashboard. Electronically Signed   By: Suzy Bouchard M.D.   On: 09/22/2019 15:06   Disposition   Pt deemed to be stable for discharge home today in good condition by Dr. Harrell Gave.  Follow-up Plans & Appointments    Follow-up Information    Erlene Quan, PA-C Follow up on 10/07/2019.   Specialties: Cardiology, Radiology Why: Please arrive 15 minutes early for your 9:15am post-hospital cardiology follow-up appointment Contact information: [facility] [address] [place] [address] [phone]            Discharge Medications   Allergies as of 09/23/2019      Reactions   Labetalol    headaches   Codeine Anxiety  Medication List    STOP taking these medications   Bismatrol 262 MG chewable tablet Generic drug: bismuth subsalicylate   doxycycline 100 MG capsule Commonly known as: VIBRAMYCIN   labetalol 100 MG tablet Commonly known as: NORMODYNE   metroNIDAZOLE 250 MG tablet Commonly known as: FLAGYL     TAKE these medications   acetaminophen 650 MG CR tablet Commonly known as: TYLENOL Take 1,300 mg by mouth every 8 (eight) hours as needed for pain.   amLODipine 10 MG tablet Commonly known as: NORVASC Take 10 mg by mouth daily at 3 pm.   carvedilol 25 MG tablet Commonly known as: COREG Take 1 tablet (25 mg total) by mouth 2 (two) times daily with a meal.   Dialyvite Vitamin D 5000 125 MCG (5000 UT) capsule Generic drug: Cholecalciferol Take 5,000 Units by mouth daily.   Eliquis 5 MG Tabs tablet Generic drug: apixaban Take 1 tablet by mouth twice daily What changed: how much to take   hydrALAZINE 10 MG tablet Commonly known as: APRESOLINE Take 1 tablet (10 mg total) by mouth 3 (three) times daily.   isosorbide dinitrate  5 MG tablet Commonly known as: ISORDIL Take 1 tablet (5 mg total) by mouth 3 (three) times daily.   olmesartan 40 MG tablet Commonly known as: BENICAR Take 40 mg by mouth at bedtime.   omeprazole 40 MG capsule Commonly known as: PriLOSEC Take 1 capsule (40 mg total) by mouth daily. Take 30 minutes before breakfast or dinner   rosuvastatin 40 MG tablet Commonly known as: CRESTOR Take 1 tablet by mouth once daily What changed: when to take this   Synthroid 112 MCG tablet Generic drug: levothyroxine Take 1 tablet by mouth once daily What changed:   how much to take  when to take this          Outstanding Labs/Studies   None  Duration of Discharge Encounter   Greater than 30 minutes including physician time.  Signed, Abigail Butts, PA-C 09/23/2019, 3:40 PM

## 2019-09-23 NOTE — Progress Notes (Signed)
Progress Note  Patient Name: ADRIANAH VIERTEL Date of Encounter: 09/23/2019  Primary Cardiologist: Shelva Majestic, MD   Subjective   Feeling much better. We spent time today reviewing both her stress test and echo at length, as well as options for further workup/management. Summarized below. Reviewed her current medications. She is amenable to TID dosing and feels this would be fine at home. She finally feels good on a med regimen and wants to keep what she has working.  Went through medication bag today. Confirmed that metronidazole was 10 days only, stopped today.  Inpatient Medications    Scheduled Meds:  amLODipine  10 mg Oral Q1500   apixaban  5 mg Oral BID   carvedilol  25 mg Oral BID WC   hydrALAZINE  10 mg Oral TID   irbesartan  300 mg Oral Daily   isosorbide dinitrate  5 mg Oral TID   levothyroxine  112 mcg Oral QAC breakfast   metroNIDAZOLE  250 mg Oral TID   pantoprazole  80 mg Oral Daily   regadenoson  0.4 mg Intravenous Once   rosuvastatin  40 mg Oral Daily   sodium chloride flush  3 mL Intravenous Once   Continuous Infusions:  PRN Meds: acetaminophen, ondansetron (ZOFRAN) IV   Vital Signs    Vitals:   09/23/19 0119 09/23/19 0425 09/23/19 0535 09/23/19 0758  BP: 118/69 118/69  118/75  Pulse: 81 76  75  Resp: 20 20  16   Temp: (!) 97.5 F (36.4 C) 97.6 F (36.4 C)  98.7 F (37.1 C)  TempSrc: Oral Oral  Oral  SpO2: 95% 93%  98%  Weight:   91.5 kg   Height:        Intake/Output Summary (Last 24 hours) at 09/23/2019 1001 Last data filed at 09/22/2019 2100 Gross per 24 hour  Intake 1077 ml  Output --  Net 1077 ml   Last 3 Weights 09/23/2019 09/22/2019 09/21/2019  Weight (lbs) 201 lb 11.2 oz 200 lb 6.4 oz 205 lb  Weight (kg) 91.491 kg 90.901 kg 92.987 kg      Telemetry    Atrial fib - Personally Reviewed  ECG    Atrial fib - Personally Reviewed  Physical Exam   GEN: No acute distress.  Comfortable, nearly flat in bed with no  oxygen on Neck: No JVD Cardiac: irregularly irregular S1 and S2, no murmurs, rubs, or gallops.  Respiratory: Clear to auscultation bilaterally. GI: Soft, nontender, non-distended  MS: No edema; No deformity. Neuro:  Nonfocal  Psych: Normal affect   Labs    High Sensitivity Troponin:   Recent Labs  Lab 09/21/19 1623 09/21/19 1900  TROPONINIHS 9 9      Chemistry Recent Labs  Lab 09/21/19 1623 09/22/19 0226  NA 140 141  K 4.0 3.7  CL 106 105  CO2 25 25  GLUCOSE 89 95  BUN 12 12  CREATININE 0.98 0.96  CALCIUM 9.7 9.0  GFRNONAA 58* 60*  GFRAA >60 >60  ANIONGAP 9 11     Hematology Recent Labs  Lab 09/21/19 1623 09/22/19 0226  WBC 4.4 4.7  RBC 5.45* 4.95  HGB 15.7* 14.4  HCT 49.8* 44.9  MCV 91.4 90.7  MCH 28.8 29.1  MCHC 31.5 32.1  RDW 15.3 15.1  PLT 246 216    BNP Recent Labs  Lab 09/21/19 1623  BNP 858.8*     DDimer No results for input(s): DDIMER in the last 168 hours.   Radiology  Dg Chest 2 View  Result Date: 09/21/2019 CLINICAL DATA:  Chest pain EXAM: CHEST - 2 VIEW COMPARISON:  Chest radiograph dated 07/29/2016 FINDINGS: The heart remains enlarged. The lungs are clear. Degenerative changes are seen in the spine. IMPRESSION: Cardiomegaly.  Clear lungs. Electronically Signed   By: Zerita Boers M.D.   On: 09/21/2019 14:35   Nm Myocar Multi W/spect W/wall Motion / Ef  Result Date: 09/22/2019 CLINICAL DATA:  72 year old female with history of congestive heart failure and atrial fibrillation. Coronary artery disease. Chest pain EXAM: MYOCARDIAL IMAGING WITH SPECT (REST AND PHARMACOLOGIC-STRESS) GATED LEFT VENTRICULAR WALL MOTION STUDY LEFT VENTRICULAR EJECTION FRACTION TECHNIQUE: Standard myocardial SPECT imaging was performed after resting intravenous injection of 10 mCi Tc-10m tetrofosmin. Subsequently, intravenous infusion of Lexiscan was performed under the supervision of the Cardiology staff. At peak effect of the drug, 30 mCi Tc-45m  tetrofosmin was injected intravenously and standard myocardial SPECT imaging was performed. Quantitative gated imaging was also performed to evaluate left ventricular wall motion, and estimate left ventricular ejection fraction. COMPARISON:  Nuclear medicine perfusion scan 01/26/2009 FINDINGS: Perfusion: Large region severe decreased counts within the apical mid and basilar segment of the lateral wall inferolateral wall which are fixed on rest and stress. There is a small rim of reversibility along the margins of this large fixed defect. Small region of moderate decreased counts within the apical segment of the anterior septal wall. Wall Motion: Global hypokinesia.  Mild LEFT ventricular dilatation. Left Ventricular Ejection Fraction: 34 % End diastolic volume 123XX123 ml End systolic volume 99991111 ml IMPRESSION: 1. Large infarction involving the lateral wall and inferolateral wall with a rim of peri-infarct ischemia. Small region of moderate reversibility within the apical segment of the anterior septal wall concerning for ischemia. 2. Global hypokinesia. 3. Left ventricular ejection fraction 34% 4. Non invasive risk stratification*: High (risk stratification primary based on low ejection fraction). *2012 Appropriate Use Criteria for Coronary Revascularization Focused Update: J Am Coll Cardiol. [id number]. http://content.airportbarriers.com.aspx?articleid=1201161 These results will be called to the ordering clinician or representative by the Radiologist Assistant, and communication documented in the PACS or zVision Dashboard. Electronically Signed   By: Suzy Bouchard M.D.   On: 09/22/2019 15:06    Cardiac Studies   Lexiscan 09/22/19 FINDINGS: Perfusion: Large region severe decreased counts within the apical mid and basilar segment of the lateral wall inferolateral wall which are fixed on rest and stress. There is a small rim of reversibility along the margins of this large fixed defect.  Small  region of moderate decreased counts within the apical segment of the anterior septal wall.  Wall Motion: Global hypokinesia.  Mild LEFT ventricular dilatation. Left Ventricular Ejection Fraction: 34 % End diastolic volume 123XX123 ml End systolic volume 99991111 ml  IMPRESSION: 1. Large infarction involving the lateral wall and inferolateral wall with a rim of peri-infarct ischemia. Small region of moderate reversibility within the apical segment of the anterior septal wall concerning for ischemia. 2. Global hypokinesia. 3. Left ventricular ejection fraction 34% 4. Non invasive risk stratification*: High (risk stratification primary based on low ejection fraction).  Echo 09/22/19  1. Left ventricular ejection fraction, by visual estimation, is 45 to 50%. The left ventricle has mildly reduced function. Normal size. There is moderately increased left ventricular hypertrophy. Basal to mid inferior/inferolateral hypokinesis  2. Left ventricular diastolic Doppler parameters are indeterminate pattern of LV diastolic filling.  3. Global right ventricle has normal systolic function.The right ventricular size is normal. No increase in right ventricular  wall thickness.  4. Left atrial size was moderately dilated.  5. Right atrial size was normal.  6. Trivial pericardial effusion is present.  7. The mitral valve is normal in structure. Mild mitral valve regurgitation.  8. The tricuspid valve is normal in structure. Tricuspid valve regurgitation was not visualized by color flow Doppler.  9. The aortic valve is tricuspid Aortic valve regurgitation is mild by color flow Doppler. 10. Normal pulmonary artery systolic pressure. 11. The inferior vena cava is normal in size with greater than 50% respiratory variability, suggesting right atrial pressure of 3 mmHg.   Patient Profile     73 y.o. female with CAD s/p prior PTCA 1991, 1992, HTN, HLD, OSA on CPAP who presented with shortness of breath and chest  pressure as well as elevated blood pressure with volume overload.  Assessment & Plan    Acute systolic and diastolic heart failure: -Echo with EF 45-50%, slightly improved from prior in 2017 -feeling much better after lasix -appears euvolemic -on ARB and beta blocker -discussed results of her stress test at length. She has known prior MI with scar from 1990s. I personally reviewed images. Location is similar to prior, possibly with some peri-infarct ischemia but small area at risk. As she now feels better, no additional chest pressure, she is amenable to not pursuing cath at this time. -given HF education: Do the following things EVERY DAY:  1) Weigh yourself EVERY morning after you go to the bathroom but before you eat or drink anything. Write this number down in a weight log/diary. If you gain 3 pounds overnight or 5 pounds in a week, call the office.  2) Take your medicines as prescribed. If you have concerns about your medications, please call us before you stop taking them.   3) Eat low salt foods--Limit salt (sodium) to 2000 mg per day. This will help prevent your body from holding onto fluid. Read food labels as many processed foods have a lot of sodium, especially canned goods and prepackaged meats. If you would like some assistance choosing low sodium foods, we would be happy to set you up with a nutritionist.  4) Stay as active as you can everyday. Staying active will give you more energy and make your muscles stronger. Start with 5 minutes at a time and work your way up to 30 minutes a day. Break up your activities--do some in the morning and some in the afternoon. Start with 3 days per week and work your way up to 5 days as you can.  If you have chest pain, feel short of breath, dizzy, or lightheaded, STOP. If you don't feel better after a short rest, call 911. If you do feel better, call the office to let us know you have symptoms with exercise.  5) Limit all fluids for the day to  less than 2 liters. Fluid includes all drinks, coffee, juice, ice chips, soup, jello, and all other liquids.   Hypertension: much better controlled on current regimen -continue  Atrial fibrillation: less symptomatic today. We discussed options in total as below: -holding AC, cath, restarting AC, then TEE-CV with AC at least 4 weeks -TEE-CV with AC 4 weeks -rate control, complete GI workup, then discussion TEE-CV or CV once GI workup complete.  As she is feeling much better, she is in agreement with rate control for now, complete GI workup (ok to hold [facility] if needed), then follow up with cardiology to discuss CV/TEE-CV.  Instructed on red flag warning  signs for which she should contact us. Has scale, BP cuff at home, will keep a log and let us know of changes.  We will get her to ambulate and make sure she is rate controlled and asymptomatic. If she does well, anticipate discharge home later today.  For questions or updates, please contact [place] Please consult [email or web address] for contact info under        Signed, Buford Dresser, MD  09/23/2019, 10:01 AM

## 2019-10-07 ENCOUNTER — Ambulatory Visit (INDEPENDENT_AMBULATORY_CARE_PROVIDER_SITE_OTHER): Payer: PPO | Admitting: Cardiology

## 2019-10-07 ENCOUNTER — Encounter: Payer: Self-pay | Admitting: Cardiology

## 2019-10-07 ENCOUNTER — Other Ambulatory Visit: Payer: Self-pay

## 2019-10-07 VITALS — BP 93/63 | HR 73 | Temp 96.7°F | Ht 65.0 in | Wt 205.0 lb

## 2019-10-07 DIAGNOSIS — I428 Other cardiomyopathies: Secondary | ICD-10-CM | POA: Diagnosis not present

## 2019-10-07 DIAGNOSIS — I5043 Acute on chronic combined systolic (congestive) and diastolic (congestive) heart failure: Secondary | ICD-10-CM

## 2019-10-07 DIAGNOSIS — I5041 Acute combined systolic (congestive) and diastolic (congestive) heart failure: Secondary | ICD-10-CM | POA: Diagnosis not present

## 2019-10-07 DIAGNOSIS — I2510 Atherosclerotic heart disease of native coronary artery without angina pectoris: Secondary | ICD-10-CM

## 2019-10-07 DIAGNOSIS — Z7901 Long term (current) use of anticoagulants: Secondary | ICD-10-CM | POA: Diagnosis not present

## 2019-10-07 DIAGNOSIS — G4733 Obstructive sleep apnea (adult) (pediatric): Secondary | ICD-10-CM | POA: Diagnosis not present

## 2019-10-07 DIAGNOSIS — Z9989 Dependence on other enabling machines and devices: Secondary | ICD-10-CM | POA: Diagnosis not present

## 2019-10-07 DIAGNOSIS — I110 Hypertensive heart disease with heart failure: Secondary | ICD-10-CM | POA: Diagnosis not present

## 2019-10-07 DIAGNOSIS — N182 Chronic kidney disease, stage 2 (mild): Secondary | ICD-10-CM

## 2019-10-07 DIAGNOSIS — I480 Paroxysmal atrial fibrillation: Secondary | ICD-10-CM | POA: Diagnosis not present

## 2019-10-07 DIAGNOSIS — Z9861 Coronary angioplasty status: Secondary | ICD-10-CM | POA: Diagnosis not present

## 2019-10-07 LAB — BASIC METABOLIC PANEL
BUN/Creatinine Ratio: 17 (ref 12–28)
BUN: 15 mg/dL (ref 8–27)
CO2: 22 mmol/L (ref 20–29)
Calcium: 9.6 mg/dL (ref 8.7–10.3)
Chloride: 105 mmol/L (ref 96–106)
Creatinine, Ser: 0.87 mg/dL (ref 0.57–1.00)
GFR calc Af Amer: 78 mL/min/{1.73_m2} (ref >59)
GFR calc non Af Amer: 67 mL/min/{1.73_m2} (ref >59)
Glucose: 72 mg/dL (ref 65–99)
Potassium: 4.8 mmol/L (ref 3.5–5.2)
Sodium: 140 mmol/L (ref 134–144)

## 2019-10-07 NOTE — Assessment & Plan Note (Signed)
Admitted 09/21/2019

## 2019-10-07 NOTE — Assessment & Plan Note (Signed)
1992 s/p MI and PTCA of unknown vessel (Dr Harshaw)    09/2010 Cath: LM nl, LAD 20p, LCX 20m, RCA 10m, RPL 20. Myoview 09/22/2019- scar with peri-infarct ischemia- medical rx 

## 2019-10-07 NOTE — Patient Instructions (Signed)
Medication Instructions:  Continue current medications  *If you need a refill on your cardiac medications before your next appointment, please call your pharmacy*  Lab Work: BMP today  If you have labs (blood work) drawn today and your tests are completely normal, you will receive your results only by: Marland Kitchen MyChart Message (if you have MyChart) OR . A paper copy in the mail If you have any lab test that is abnormal or we need to change your treatment, we will call you to review the results.  Testing/Procedures: None Ordered  Follow-Up: At [facility], you and your health needs are our priority.  As part of our continuing mission to provide you with exceptional heart care, we have created designated Provider Care Teams.  These Care Teams include your primary Cardiologist (physician) and Advanced Practice Providers (APPs -  Physician Assistants and Nurse Practitioners) who all work together to provide you with the care you need, when you need it.  Your next appointment:   6 Weeks   The format for your next appointment:   In Person  Provider:   Kerin Ransom, [place]

## 2019-10-07 NOTE — Assessment & Plan Note (Signed)
Echo Oct 2020 shows EF 45-50% with moderate LVH B/P is actually a little low in the office.  I suggested we decrease her hydralazine but the patient did not feel comfortable changing her current medication regimen.  She will continue to monitor her B/P at home and call us if she has problems.

## 2019-10-07 NOTE — Assessment & Plan Note (Signed)
She reports compliance with C-pap 

## 2019-10-07 NOTE — Progress Notes (Addendum)
Cardiology Office Note:    Date:  10/07/2019   ID:  HARRIETT ANTILL, DOB 08/11/1946, MRN [id number]  PCP:  Girtha Rm, NP-C  Cardiologist:  Shelva Majestic, MD  Electrophysiologist:  None   Referring MD: Girtha Rm, NP-C   Post hospital follow up  History of Present Illness:    Claudia Cantrell is a 73 y.o. female with a hx of CAD, S/P remote PCI in the 90'2, OSA-on C-pap, HTN, and PAF on Eliquis.  The patient had an endoscopy 09/02/2019.  She was going to have a polyp biopsied but the procedure was aborted secondary to SOB and HTN (according to the patient).  She was to follow up with Dr Claiborne Billings but presented to the ED first on 09/21/2019 with chest pain, CHF, HTN, and AF.  She ruled out for an MI.  She was diuresed and her medications were adjusted for HTN and AF.  Myoview showed scar with some superimposed ischemia.  After review by Dr Harrell Gave it was decided not to proceed with coronary angiogram.   The patient was discharged 09/23/2019 and is seen today in follow up.  She denies orthopnea. Overall she feels much improved since her admission.  She is following her B/P at home and watching her sodium intake.   At discharge it was decided to have complete her GI work up then resume anticoagulation and plan on DCCV  4 weeks after Eliquis resumed.   Past Medical History:  Diagnosis Date  . Arthritis    rheumatoid  . CAD (coronary artery disease)    a. 1992 s/p MI and PTCA of unknown vessel;  b. 09/2010 Cath: LM nl, LAD 20p, LCX 69m, RCA 42m, RPL 20.  Marland Kitchen Cervical cancer ([place]) 1977  . Family history of anesthesia complication    daughter has difficulty waking   . GERD (gastroesophageal reflux disease)    occ  . Gout 10/08/2016  . Hyperlipidemia   . Hypertensive heart disease   . Hypothyroidism   . Nonischemic cardiomyopathy ([place])    a. 07/2016 Echo: EF 40-45%, mild LVH, inferior akinesis, moderately dilated left atrium, trivial AI and MR.  Marland Kitchen PAF (paroxysmal atrial fibrillation)  ([place])    a. 07/2016 Admitted w/ AF RVR-->CHA2DS2VASc = 5-->Eliquis;  b. 07/2016 successful TEE/DCCV.  Marland Kitchen Sleep apnea    a. Using CPAP.    Past Surgical History:  Procedure Laterality Date  . ABDOMINAL HYSTERECTOMY  1988  . BACK SURGERY  1994  . BIOPSY  09/02/2019   Procedure: BIOPSY;  Surgeon: Rush Landmark Telford Nab., MD;  Location: [facility] ENDOSCOPY;  Service: Gastroenterology;;  . [place]   PTCA BY DR Lamount Cohen  . CARDIOVERSION N/A 08/01/2016   Procedure: CARDIOVERSION;  Surgeon: Lelon Perla, MD;  Location: [facility] ENDOSCOPY;  Service: Cardiovascular;  Laterality: N/A;  . ESOPHAGOGASTRODUODENOSCOPY (EGD) WITH PROPOFOL N/A 09/02/2019   Procedure: ESOPHAGOGASTRODUODENOSCOPY (EGD) WITH PROPOFOL;  Surgeon: Irving Copas., MD;  Location: [facility] ENDOSCOPY;  Service: Gastroenterology;  Laterality: N/A;  . EUS N/A 09/02/2019   Procedure: UPPER ENDOSCOPIC ULTRASOUND (EUS) RADIAL;  Surgeon: Rush Landmark Telford Nab., MD;  Location: [facility] ENDOSCOPY;  Service: Gastroenterology;  Laterality: N/A;  EUS radial/linear  . FINE NEEDLE ASPIRATION  09/02/2019   Procedure: FINE NEEDLE ASPIRATION (FNA) LINEAR;  Surgeon: Irving Copas., MD;  Location: [facility] ENDOSCOPY;  Service: Gastroenterology;;  . ORIF ANKLE FRACTURE Right 04/27/2015   Procedure: OPEN REDUCTION INTERNAL FIXATION (ORIF) RIGHT BIMALLEOLAR ANKLE FRACTURE WITH SYNDESMOSIS FIXATION;  Surgeon: Leandrew Koyanagi, MD;  Location: [place];  Service: Orthopedics;  Laterality: Right;  . TEE WITHOUT CARDIOVERSION N/A 08/01/2016   Procedure: TRANSESOPHAGEAL ECHOCARDIOGRAM (TEE);  Surgeon: Lelon Perla, MD;  Location[facility] ENDOSCOPY;  Service: Cardiovascular;  Laterality: N/A;  . THYROIDECTOMY  11/24/2013   DR Dalbert Batman  . THYROIDECTOMY N/A 11/24/2013   Procedure: TOTAL THYROIDECTOMY;  Surgeon: Adin Hector, MD;  Location: [facility];  Service: General;  Laterality: N/A;  . TRANSTHORACIC ECHOCARDIOGRAM  01/15/2013   EF 55% TO 65%.  PROBABLE MILD HYPOKINESIS OF THE INFERIOR MYOCARDIUM. GRADE 1 DIASTOLIC DYSFUNCTION. TRIAL AR.LA IS MILDLY DILATED.    Current Medications: Current Meds  Medication Sig  . acetaminophen (TYLENOL) 650 MG CR tablet Take 1,300 mg by mouth every 8 (eight) hours as needed for pain.  Marland Kitchen amLODipine (NORVASC) 10 MG tablet Take 10 mg by mouth daily at 3 pm.   . carvedilol (COREG) 25 MG tablet Take 1 tablet (25 mg total) by mouth 2 (two) times daily with a meal.  . Cholecalciferol (DIALYVITE VITAMIN D 5000) 125 MCG (5000 UT) capsule Take 5,000 Units by mouth daily.  Marland Kitchen ELIQUIS 5 MG TABS tablet Take 1 tablet by mouth twice daily (Patient taking differently: Take 5 mg by mouth 2 (two) times daily. )  . hydrALAZINE (APRESOLINE) 10 MG tablet Take 1 tablet (10 mg total) by mouth 3 (three) times daily.  . isosorbide dinitrate (ISORDIL) 5 MG tablet Take 1 tablet (5 mg total) by mouth 3 (three) times daily.  Marland Kitchen olmesartan (BENICAR) 40 MG tablet Take 40 mg by mouth at bedtime.   Marland Kitchen omeprazole (PRILOSEC) 40 MG capsule Take 1 capsule (40 mg total) by mouth daily. Take 30 minutes before breakfast or dinner  . rosuvastatin (CRESTOR) 40 MG tablet Take 1 tablet by mouth once daily (Patient taking differently: Take 40 mg by mouth daily with lunch. )  . SYNTHROID 112 MCG tablet Take 1 tablet by mouth once daily (Patient taking differently: Take 112 mcg by mouth daily before breakfast. )     Allergies:   Labetalol and Codeine   Social History   Socioeconomic History  . Marital status: Widowed    Spouse name: Not on file  . Number of children: 3  . Years of education: Not on file  . Highest education level: Not on file  Occupational History  . Occupation: retired  Scientific laboratory technician  . Financial resource strain: Not on file  . Food insecurity    Worry: Not on file    Inability: Not on file  . Transportation needs    Medical: Not on file    Non-medical: Not on file  Tobacco Use  . Smoking status: Former Smoker     Packs/day: 1.00    Years: 15.00    Pack years: 15.00    Types: Cigarettes    Quit date: 12/10/1990    Years since quitting: 28.8  . Smokeless tobacco: Never Used  . Tobacco comment: occ alcohol  Substance and Sexual Activity  . Alcohol use: Yes    Comment: very rare  . Drug use: No  . Sexual activity: Not on file  Lifestyle  . Physical activity    Days per week: Not on file    Minutes per session: Not on file  . Stress: Not on file  Relationships  . Social Herbalist on phone: Not on file    Gets together: Not on file    Attends religious  service: Not on file    Active member of club or organization: Not on file    Attends meetings of clubs or organizations: Not on file    Relationship status: Not on file  Other Topics Concern  . Not on file  Social History Narrative  . Not on file     Family History: The patient's family history includes Bone cancer in her sister; Diabetes in her father; Heart disease in her brother and mother; Melanoma in her brother; Stroke in her father; Sudden death in her brother and mother. There is no history of Colon cancer, Esophageal cancer, Inflammatory bowel disease, Liver disease, Pancreatic cancer, Rectal cancer, or Stomach cancer.  ROS:   Please see the history of present illness.     All other systems reviewed and are negative.  EKGs/Labs/Other Studies Reviewed:    The following studies were reviewed today: Myoview 09/22/2019 Echo 09/22/2019  EKG:  EKG is ordered today.  The ekg ordered today demonstrates AF with VR 67  Recent Labs: 01/14/2019: TSH 1.130 02/11/2019: ALT 35 09/21/2019: B Natriuretic Peptide 858.8 09/23/2019: BUN 19; Creatinine, Ser 1.26; Hemoglobin 13.6; Magnesium 2.1; Platelets 243; Potassium 3.3; Sodium 138  Recent Lipid Panel    Component Value Date/Time   CHOL 114 09/22/2019 0226   CHOL 148 01/14/2019 1045   TRIG 94 09/22/2019 0226   HDL 34 (L) 09/22/2019 0226   HDL 50 01/14/2019 1045   CHOLHDL 3.4  09/22/2019 0226   VLDL 19 09/22/2019 0226   LDLCALC 61 09/22/2019 0226   LDLCALC 78 01/14/2019 1045    Physical Exam:    VS:  BP 93/63   Pulse 73   Temp (!) 96.7 F (35.9 C)   Ht 5\' 5"  (1.651 m)   Wt 205 lb (93 kg)   BMI 34.11 kg/m     Wt Readings from Last 3 Encounters:  10/07/19 205 lb (93 kg)  09/23/19 201 lb 11.2 oz (91.5 kg)  09/21/19 205 lb 9.6 oz (93.3 kg)     GEN: Well nourished, well developed in no acute distress HEENT: Normal NECK: No JVD; No carotid bruits LYMPHATICS: No lymphadenopathy CARDIAC: irregularly irregular rhythm , no murmurs, rubs, gallops RESPIRATORY:  Few bibasilar crackles ABDOMEN: Soft, non-tender, non-distended MUSCULOSKELETAL:  No edema; No deformity  SKIN: Warm and dry NEUROLOGIC:  Alert and oriented x 3 PSYCHIATRIC:  Normal affect   ASSESSMENT:    Acute combined systolic and diastolic CHF, NYHA class 3 (HCC) Admitted 09/21/2019  CAD S/P percutaneous coronary angioplasty 1992 s/p MI and PTCA of unknown vessel (Dr Sherald Barge)    09/2010 Cath: LM nl, LAD 20p, LCX 45m, RCA 31m, RPL 20. Myoview 09/22/2019- scar with peri-infarct ischemia- medical rx  Nonischemic cardiomyopathy [facility]) 2D Oct 2020- EF 45-50% with moderate LVH   Hypertensive heart disease Echo Oct 2020 shows EF 45-50% with moderate LVH B/P is actually a little low in the office.  I suggested we decrease her hydralazine but the patient did not feel comfortable changing her current medication regimen.  She will continue to monitor her B/P at home and call us if she has problems.   OSA on CPAP She reports compliance with C-pap  PLAN:    Check BMP today. She is cleared to complete GI evaluation.  OK to hold Eliquis 48 hours pre op if needed for this.  Once Eliquis is resumed for 4 weeks plan OP DCCV.  I will see the patient back in 5-6 weeks.    Medication Adjustments/Labs  and Tests Ordered: Current medicines are reviewed at length with the patient today.  Concerns  regarding medicines are outlined above.  Orders Placed This Encounter  Procedures  . Basic Metabolic Panel (BMET)  . EKG 12-Lead   No orders of the defined types were placed in this encounter.   Patient Instructions  Medication Instructions:  Continue current medications  *If you need a refill on your cardiac medications before your next appointment, please call your pharmacy*  Lab Work: BMP today  If you have labs (blood work) drawn today and your tests are completely normal, you will receive your results only by: Marland Kitchen MyChart Message (if you have MyChart) OR . A paper copy in the mail If you have any lab test that is abnormal or we need to change your treatment, we will call you to review the results.  Testing/Procedures: None Ordered  Follow-Up: At [facility], you and your health needs are our priority.  As part of our continuing mission to provide you with exceptional heart care, we have created designated Provider Care Teams.  These Care Teams include your primary Cardiologist (physician) and Advanced Practice Providers (APPs -  Physician Assistants and Nurse Practitioners) who all work together to provide you with the care you need, when you need it.  Your next appointment:   6 Weeks   The format for your next appointment:   In Person  Provider:   Kerin Ransom, PA      Signed, Kerin Ransom, [place]  10/07/2019 10:06 AM    [place]

## 2019-10-07 NOTE — Assessment & Plan Note (Signed)
2D Oct 2020- EF 45-50% with moderate LVH

## 2019-10-16 ENCOUNTER — Other Ambulatory Visit: Payer: Self-pay | Admitting: Cardiovascular Disease

## 2019-10-20 ENCOUNTER — Telehealth: Payer: Self-pay

## 2019-10-20 NOTE — Telephone Encounter (Signed)
-----   Message from Claudia Lasso, RN sent at 09/21/2019  4:32 PM EDT ----- Chong Sicilian recovering RN,  Patient needs an EGD/EUS with EMR 90-minute slot in 4 to 6 weeks.  Please let me know when it is scheduled.  Thanks.  GM

## 2019-10-21 ENCOUNTER — Other Ambulatory Visit: Payer: Self-pay

## 2019-10-21 DIAGNOSIS — K3189 Other diseases of stomach and duodenum: Secondary | ICD-10-CM

## 2019-10-21 NOTE — Telephone Encounter (Signed)
Left message on machine to call back  

## 2019-10-21 NOTE — Telephone Encounter (Signed)
EUS-EGD-EMR scheduled with Dr Rush Landmark on 12/09/19 at 730 am [facility].  COVID testing on 12/26.  Left message on machine to call back

## 2019-10-22 NOTE — Telephone Encounter (Signed)
Left message on machine to call back  

## 2019-10-23 NOTE — Telephone Encounter (Signed)
Several messages have been left for the pt to return call to go over instructions with no response.  All information was mailed to the pt home.  Will await a phone call.

## 2019-11-02 ENCOUNTER — Telehealth: Payer: Self-pay | Admitting: Cardiovascular Disease

## 2019-11-02 NOTE — Telephone Encounter (Signed)
New Message  Pt c/o swelling: STAT is pt has developed SOB within 24 hours  1) How much weight have you gained and in what time span? 3 lbs in a week  2) If swelling, where is the swelling located? Feet and ankles  3) Are you currently taking a fluid pill? No  4) Are you currently SOB? Yes, started over the weekend  5) Do you have a log of your daily weights (if so, list)? 204, 207,   6) Have you gained 3 pounds in a day or 5 pounds in a week? No  7) Have you traveled recently? No

## 2019-11-02 NOTE — Telephone Encounter (Signed)
Spoke with pt, weight at discharge from hospital 201lb and this weekend she was 205 lb. She has swelling in the abdomen, feet and ankles that does go down over night but not all the way. She does report diet indiscretion of salty chips last week. She does reports SOB when climbing up 7 to 8 steps, denies orthopnea. She does feel like she is still in atrial fib but no heart racing. Medications confirmed. Will forward to [facility] to review and advise.

## 2019-11-03 ENCOUNTER — Telehealth: Payer: Self-pay

## 2019-11-03 ENCOUNTER — Encounter: Payer: Self-pay | Admitting: Cardiology

## 2019-11-03 ENCOUNTER — Telehealth (INDEPENDENT_AMBULATORY_CARE_PROVIDER_SITE_OTHER): Payer: PPO | Admitting: Cardiology

## 2019-11-03 VITALS — BP 125/92 | HR 87 | Ht 65.0 in | Wt 208.0 lb

## 2019-11-03 DIAGNOSIS — Z7901 Long term (current) use of anticoagulants: Secondary | ICD-10-CM | POA: Diagnosis not present

## 2019-11-03 DIAGNOSIS — I428 Other cardiomyopathies: Secondary | ICD-10-CM

## 2019-11-03 DIAGNOSIS — I110 Hypertensive heart disease with heart failure: Secondary | ICD-10-CM

## 2019-11-03 DIAGNOSIS — Z7189 Other specified counseling: Secondary | ICD-10-CM

## 2019-11-03 DIAGNOSIS — G4733 Obstructive sleep apnea (adult) (pediatric): Secondary | ICD-10-CM | POA: Diagnosis not present

## 2019-11-03 DIAGNOSIS — I2510 Atherosclerotic heart disease of native coronary artery without angina pectoris: Secondary | ICD-10-CM

## 2019-11-03 DIAGNOSIS — Z9861 Coronary angioplasty status: Secondary | ICD-10-CM

## 2019-11-03 DIAGNOSIS — Z9989 Dependence on other enabling machines and devices: Secondary | ICD-10-CM | POA: Diagnosis not present

## 2019-11-03 DIAGNOSIS — I480 Paroxysmal atrial fibrillation: Secondary | ICD-10-CM

## 2019-11-03 DIAGNOSIS — N182 Chronic kidney disease, stage 2 (mild): Secondary | ICD-10-CM

## 2019-11-03 DIAGNOSIS — I4819 Other persistent atrial fibrillation: Secondary | ICD-10-CM

## 2019-11-03 DIAGNOSIS — I509 Heart failure, unspecified: Secondary | ICD-10-CM

## 2019-11-03 DIAGNOSIS — I5041 Acute combined systolic (congestive) and diastolic (congestive) heart failure: Secondary | ICD-10-CM

## 2019-11-03 MED ORDER — HYDROCHLOROTHIAZIDE 12.5 MG PO CAPS: 12.5000 mg | ORAL_CAPSULE | Freq: Every day | ORAL | 1 refills | Status: DC

## 2019-11-03 NOTE — Telephone Encounter (Signed)
Contacted patient to discuss AVS Instructions. Gave patient Claudia Cantrell recommendations from today's virtual office visit. Informed patient that her previous appt for 11/25/2019 has been canceled and rescheduled her appt to 2 weeks after her endoscopy per [facility].  Patient voiced understanding and AVS mailed.

## 2019-11-03 NOTE — Patient Instructions (Addendum)
Medication Instructions:  START Hydrochlorothiazide 12.5mg  One the first day you start this medication take 2 (two) tablets then after that only take 1 tablet once a day  *If you need a refill on your cardiac medications before your next appointment, please call your pharmacy*  Lab Work: None  If you have labs (blood work) drawn today and your tests are completely normal, you will receive your results only by: Marland Kitchen MyChart Message (if you have MyChart) OR . A paper copy in the mail If you have any lab test that is abnormal or we need to change your treatment, we will call you to review the results.  Testing/Procedures: None   Follow-Up: At [facility], you and your health needs are our priority.  As part of our continuing mission to provide you with exceptional heart care, we have created designated Provider Care Teams.  These Care Teams include your primary Cardiologist (physician) and Advanced Practice Providers (APPs -  Physician Assistants and Nurse Practitioners) who all work together to provide you with the care you need, when you need it.  Your next appointment:   7 week(s) OR TWO WEEKS AFTER ENDOSCOPY ON 12/09/2019  The format for your next appointment:   In Person  Provider:   Kerin Ransom, PA-C  Other Instructions [place]

## 2019-11-03 NOTE — Telephone Encounter (Signed)
Its hard to make any recommendations without knowing her B/P.  She's on an unusual regimen and I tried to adjust her medications at the last office visit because her B/P was low but she declined.  I need a confirmed medication list and a B/P before I can make any recommendations. Maybe she should just come in and see DOD  Kerin Ransom PA-C 11/03/2019 9:14 AM

## 2019-11-03 NOTE — Telephone Encounter (Signed)

## 2019-11-09 NOTE — Telephone Encounter (Signed)
Patient discussed symptoms with luke kilroy pa

## 2019-11-24 ENCOUNTER — Telehealth: Payer: Self-pay | Admitting: Cardiovascular Disease

## 2019-11-24 NOTE — Telephone Encounter (Signed)
eliquis patient assistance application faxed to [place] @ [phone]

## 2019-11-25 ENCOUNTER — Ambulatory Visit: Payer: PPO | Admitting: Cardiology

## 2019-12-01 ENCOUNTER — Other Ambulatory Visit: Payer: Self-pay | Admitting: Family Medicine

## 2019-12-05 ENCOUNTER — Other Ambulatory Visit (HOSPITAL_COMMUNITY): Admission: RE | Admit: 2019-12-05 | Payer: PPO | Source: Ambulatory Visit

## 2019-12-07 ENCOUNTER — Other Ambulatory Visit (HOSPITAL_COMMUNITY)
Admission: RE | Admit: 2019-12-07 | Discharge: 2019-12-07 | Disposition: A | Discharge status: Home / Self Care | Payer: PPO | Source: Ambulatory Visit | Attending: Gastroenterology | Admitting: Gastroenterology

## 2019-12-07 DIAGNOSIS — Z20828 Contact with and (suspected) exposure to other viral communicable diseases: Secondary | ICD-10-CM | POA: Insufficient documentation

## 2019-12-07 DIAGNOSIS — Z01812 Encounter for preprocedural laboratory examination: Secondary | ICD-10-CM | POA: Diagnosis not present

## 2019-12-07 LAB — SARS CORONAVIRUS 2 (TAT 6-24 HRS): SARS Coronavirus 2: NEGATIVE

## 2019-12-07 NOTE — Progress Notes (Signed)
Pre-op endo call completed.  Per patient , last dose of eliquis 12-27

## 2019-12-08 NOTE — Anesthesia Preprocedure Evaluation (Addendum)
Anesthesia Evaluation  Patient identified by MRN, date of birth, ID band Patient awake    Reviewed: Allergy & Precautions, NPO status   Airway Mallampati: II  TM Distance: >3 FB     Dental   Pulmonary sleep apnea , former smoker,    breath sounds clear to auscultation       Cardiovascular + CAD and +CHF   Rhythm:Regular Rate:Normal     Neuro/Psych    GI/Hepatic GERD  ,  Endo/Other  Hypothyroidism   Renal/GU Renal disease     Musculoskeletal  (+) Arthritis ,   Abdominal   Peds  Hematology   Anesthesia Other Findings   Reproductive/Obstetrics                            Anesthesia Physical Anesthesia Plan  ASA: III  Anesthesia Plan: General   Post-op Pain Management:    Induction: Intravenous  PONV Risk Score and Plan: 3  Airway Management Planned: Nasal Cannula and Simple Face Mask  Additional Equipment:   Intra-op Plan:   Post-operative Plan:   Informed Consent: I have reviewed the patients History and Physical, chart, labs and discussed the procedure including the risks, benefits and alternatives for the proposed anesthesia with the patient or authorized representative who has indicated his/her understanding and acceptance.     Dental advisory given  Plan Discussed with: Anesthesiologist and CRNA  Anesthesia Plan Comments:        Anesthesia Quick Evaluation

## 2019-12-09 ENCOUNTER — Encounter (HOSPITAL_COMMUNITY): Payer: Self-pay | Admitting: Gastroenterology

## 2019-12-09 ENCOUNTER — Ambulatory Visit (HOSPITAL_COMMUNITY)
Admission: RE | Admit: 2019-12-09 | Discharge: 2019-12-09 | Disposition: A | Discharge status: Home / Self Care | Payer: PPO | Attending: Gastroenterology | Admitting: Gastroenterology

## 2019-12-09 ENCOUNTER — Encounter (HOSPITAL_COMMUNITY)
Admission: RE | Disposition: A | Discharge status: Home / Self Care | Payer: Self-pay | Source: Home / Self Care | Attending: Gastroenterology

## 2019-12-09 ENCOUNTER — Ambulatory Visit (HOSPITAL_COMMUNITY): Payer: PPO | Admitting: Anesthesiology

## 2019-12-09 ENCOUNTER — Other Ambulatory Visit: Payer: Self-pay

## 2019-12-09 DIAGNOSIS — N182 Chronic kidney disease, stage 2 (mild): Secondary | ICD-10-CM | POA: Diagnosis not present

## 2019-12-09 DIAGNOSIS — K3189 Other diseases of stomach and duodenum: Secondary | ICD-10-CM | POA: Diagnosis not present

## 2019-12-09 DIAGNOSIS — B9681 Helicobacter pylori [H. pylori] as the cause of diseases classified elsewhere: Secondary | ICD-10-CM

## 2019-12-09 DIAGNOSIS — Z951 Presence of aortocoronary bypass graft: Secondary | ICD-10-CM | POA: Insufficient documentation

## 2019-12-09 DIAGNOSIS — I2510 Atherosclerotic heart disease of native coronary artery without angina pectoris: Secondary | ICD-10-CM | POA: Insufficient documentation

## 2019-12-09 DIAGNOSIS — K2950 Unspecified chronic gastritis without bleeding: Secondary | ICD-10-CM | POA: Diagnosis not present

## 2019-12-09 DIAGNOSIS — I428 Other cardiomyopathies: Secondary | ICD-10-CM | POA: Diagnosis not present

## 2019-12-09 DIAGNOSIS — K449 Diaphragmatic hernia without obstruction or gangrene: Secondary | ICD-10-CM | POA: Insufficient documentation

## 2019-12-09 DIAGNOSIS — I252 Old myocardial infarction: Secondary | ICD-10-CM | POA: Diagnosis not present

## 2019-12-09 DIAGNOSIS — Z7901 Long term (current) use of anticoagulants: Secondary | ICD-10-CM | POA: Insufficient documentation

## 2019-12-09 DIAGNOSIS — D490 Neoplasm of unspecified behavior of digestive system: Secondary | ICD-10-CM | POA: Diagnosis not present

## 2019-12-09 DIAGNOSIS — Z79899 Other long term (current) drug therapy: Secondary | ICD-10-CM | POA: Insufficient documentation

## 2019-12-09 DIAGNOSIS — E785 Hyperlipidemia, unspecified: Secondary | ICD-10-CM | POA: Insufficient documentation

## 2019-12-09 DIAGNOSIS — I5041 Acute combined systolic (congestive) and diastolic (congestive) heart failure: Secondary | ICD-10-CM | POA: Insufficient documentation

## 2019-12-09 DIAGNOSIS — Z79890 Hormone replacement therapy (postmenopausal): Secondary | ICD-10-CM | POA: Diagnosis not present

## 2019-12-09 DIAGNOSIS — I899 Noninfective disorder of lymphatic vessels and lymph nodes, unspecified: Secondary | ICD-10-CM | POA: Insufficient documentation

## 2019-12-09 DIAGNOSIS — I130 Hypertensive heart and chronic kidney disease with heart failure and stage 1 through stage 4 chronic kidney disease, or unspecified chronic kidney disease: Secondary | ICD-10-CM | POA: Diagnosis not present

## 2019-12-09 DIAGNOSIS — Z8541 Personal history of malignant neoplasm of cervix uteri: Secondary | ICD-10-CM | POA: Insufficient documentation

## 2019-12-09 DIAGNOSIS — M069 Rheumatoid arthritis, unspecified: Secondary | ICD-10-CM | POA: Diagnosis not present

## 2019-12-09 DIAGNOSIS — K219 Gastro-esophageal reflux disease without esophagitis: Secondary | ICD-10-CM | POA: Insufficient documentation

## 2019-12-09 DIAGNOSIS — E039 Hypothyroidism, unspecified: Secondary | ICD-10-CM | POA: Diagnosis not present

## 2019-12-09 DIAGNOSIS — I480 Paroxysmal atrial fibrillation: Secondary | ICD-10-CM | POA: Diagnosis not present

## 2019-12-09 DIAGNOSIS — G4730 Sleep apnea, unspecified: Secondary | ICD-10-CM | POA: Insufficient documentation

## 2019-12-09 DIAGNOSIS — Z8249 Family history of ischemic heart disease and other diseases of the circulatory system: Secondary | ICD-10-CM | POA: Diagnosis not present

## 2019-12-09 DIAGNOSIS — Z87891 Personal history of nicotine dependence: Secondary | ICD-10-CM | POA: Insufficient documentation

## 2019-12-09 DIAGNOSIS — K317 Polyp of stomach and duodenum: Secondary | ICD-10-CM | POA: Diagnosis not present

## 2019-12-09 DIAGNOSIS — M1990 Unspecified osteoarthritis, unspecified site: Secondary | ICD-10-CM | POA: Insufficient documentation

## 2019-12-09 DIAGNOSIS — E559 Vitamin D deficiency, unspecified: Secondary | ICD-10-CM | POA: Insufficient documentation

## 2019-12-09 DIAGNOSIS — R933 Abnormal findings on diagnostic imaging of other parts of digestive tract: Secondary | ICD-10-CM | POA: Diagnosis not present

## 2019-12-09 HISTORY — PX: FINE NEEDLE ASPIRATION: SHX5430

## 2019-12-09 HISTORY — PX: BIOPSY: SHX5522

## 2019-12-09 HISTORY — PX: SUBMUCOSAL LIFTING INJECTION: SHX6855

## 2019-12-09 HISTORY — PX: HEMOSTASIS CLIP PLACEMENT: SHX6857

## 2019-12-09 HISTORY — PX: ENDOSCOPIC MUCOSAL RESECTION: SHX6839

## 2019-12-09 HISTORY — PX: ESOPHAGOGASTRODUODENOSCOPY (EGD) WITH PROPOFOL: SHX5813

## 2019-12-09 HISTORY — PX: EUS: SHX5427

## 2019-12-09 SURGERY — UPPER ENDOSCOPIC ULTRASOUND (EUS) RADIAL
Anesthesia: General

## 2019-12-09 MED ORDER — PROPOFOL 10 MG/ML IV BOLUS
INTRAVENOUS | Status: DC | PRN
  Administered 2019-12-09 (×3): 20 mg via INTRAVENOUS

## 2019-12-09 MED ORDER — LIDOCAINE 2% (20 MG/ML) 5 ML SYRINGE
INTRAMUSCULAR | Status: DC | PRN
  Administered 2019-12-09: 100 mg via INTRAVENOUS

## 2019-12-09 MED ORDER — PROPOFOL 10 MG/ML IV BOLUS
INTRAVENOUS | Status: AC
  Filled 2019-12-09: qty 20

## 2019-12-09 MED ORDER — FENTANYL CITRATE (PF) 100 MCG/2ML IJ SOLN: 25.0000 ug | INTRAMUSCULAR | Status: DC | PRN

## 2019-12-09 MED ORDER — PROPOFOL 500 MG/50ML IV EMUL
INTRAVENOUS | Status: DC | PRN
  Administered 2019-12-09: 120 ug/kg/min via INTRAVENOUS

## 2019-12-09 MED ORDER — PROPOFOL 500 MG/50ML IV EMUL
INTRAVENOUS | Status: AC
  Filled 2019-12-09: qty 50

## 2019-12-09 MED ORDER — LACTATED RINGERS IV SOLN: INTRAVENOUS | Status: DC

## 2019-12-09 MED ORDER — SODIUM CHLORIDE 0.9 % IV SOLN: INTRAVENOUS | Status: DC

## 2019-12-09 MED ORDER — OMEPRAZOLE 40 MG PO CPDR: 40.0000 mg | DELAYED_RELEASE_CAPSULE | Freq: Two times a day (BID) | ORAL | 4 refills | Status: DC

## 2019-12-09 MED ORDER — EPHEDRINE SULFATE-NACL 50-0.9 MG/10ML-% IV SOSY
PREFILLED_SYRINGE | INTRAVENOUS | Status: DC | PRN
  Administered 2019-12-09: 5 mg via INTRAVENOUS

## 2019-12-09 SURGICAL SUPPLY — 15 items

## 2019-12-09 NOTE — Transfer of Care (Signed)
Immediate Anesthesia Transfer of Care Note  Patient: Claudia Cantrell  Procedure(s) Performed: UPPER ENDOSCOPIC ULTRASOUND (EUS) RADIAL (N/A ) ESOPHAGOGASTRODUODENOSCOPY (EGD) WITH PROPOFOL (N/A ) ENDOSCOPIC MUCOSAL RESECTION (N/A ) UPPER ENDOSCOPIC ULTRASOUND (EUS) LINEAR FINE NEEDLE ASPIRATION (FNA) LINEAR BIOPSY SUBMUCOSAL LIFTING INJECTION HEMOSTASIS CLIP PLACEMENT  Patient Location: Endoscopy Unit  Anesthesia Type:MAC  Level of Consciousness: drowsy  Airway & Oxygen Therapy: Patient Spontanous Breathing and Patient connected to face mask oxygen  Post-op Assessment: Report given to RN and Post -op Vital signs reviewed and stable  Post vital signs: Reviewed and stable  Last Vitals:  Vitals Value Taken Time  BP    Temp    Pulse 59 12/09/19 0903  Resp 16 12/09/19 0903  SpO2 99 % 12/09/19 0903  Vitals shown include unvalidated device data.  Last Pain:  Vitals:   12/09/19 0639  TempSrc: Oral  PainSc: 0-No pain         Complications: No apparent anesthesia complications

## 2019-12-09 NOTE — Discharge Instructions (Signed)
YOU HAD AN ENDOSCOPIC PROCEDURE TODAY: Refer to the procedure report and other information in the discharge instructions given to you for any specific questions about what was found during the examination. If this information does not answer your questions, please call [place] office at [phone] to clarify.  ° °YOU SHOULD EXPECT: Some feelings of bloating in the abdomen. Passage of more gas than usual. Walking can help get rid of the air that was put into your GI tract during the procedure and reduce the bloating. If you had a lower endoscopy (such as a colonoscopy or flexible sigmoidoscopy) you may notice spotting of blood in your stool or on the toilet paper. Some abdominal soreness may be present for a day or two, also. ° °DIET: Your first meal following the procedure should be a light meal and then it is ok to progress to your normal diet. A half-sandwich or bowl of soup is an example of a good first meal. Heavy or fried foods are harder to digest and may make you feel nauseous or bloated. Drink plenty of fluids but you should avoid alcoholic beverages for 24 hours. If you had a esophageal dilation, please see attached instructions for diet.   ° °ACTIVITY: Your care partner should take you home directly after the procedure. You should plan to take it easy, moving slowly for the rest of the day. You can resume normal activity the day after the procedure however YOU SHOULD NOT DRIVE, use power tools, machinery or perform tasks that involve climbing or major physical exertion for 24 hours (because of the sedation medicines used during the test).  ° °SYMPTOMS TO REPORT IMMEDIATELY: °A gastroenterologist can be reached at any hour. Please call [phone]  for any of the following symptoms:  °Following lower endoscopy (colonoscopy, flexible sigmoidoscopy) °Excessive amounts of blood in the stool  °Significant tenderness, worsening of abdominal pains  °Swelling of the abdomen that is new, acute  °Fever of 100° or  higher  °Following upper endoscopy (EGD, EUS, ERCP, esophageal dilation) °Vomiting of blood or coffee ground material  °New, significant abdominal pain  °New, significant chest pain or pain under the shoulder blades  °Painful or persistently difficult swallowing  °New shortness of breath  °Black, tarry-looking or red, bloody stools ° °FOLLOW UP:  °If any biopsies were taken you will be contacted by phone or by letter within the next 1-3 weeks. Call [phone]  if you have not heard about the biopsies in 3 weeks.  °Please also call with any specific questions about appointments or follow up tests. ° °

## 2019-12-09 NOTE — Op Note (Addendum)
[facility] Patient Name: Claudia Cantrell Procedure Date: 12/09/2019 MRN: [id number] Attending MD: Justice Britain , MD Date of Birth: Apr 12, 1946 CSN: [id number] Age: 73 Admit Type: Inpatient Procedure:                Upper EUS Indications:              Suspected mass in stomach on CT scan, Submucosal                            tumor versus extrinsic mass found on endoscopy, For                            therapy of gastric polyps, Follow-up of                            Helicobacter pylori Providers:                Justice Britain, MD, Carlyn Reichert, RN, Lazaro Arms, Technician Referring MD:              Medicines:                Monitored Anesthesia Care Complications:            No immediate complications. Estimated Blood Loss:     Estimated blood loss was minimal. Procedure:                Pre-Anesthesia Assessment:                           - Prior to the procedure, a History and Physical                            was performed, and patient medications and                            allergies were reviewed. The patient's tolerance of                            previous anesthesia was also reviewed. The risks                            and benefits of the procedure and the sedation                            options and risks were discussed with the patient.                            All questions were answered, and informed consent                            was obtained. Prior Anticoagulants: The patient has                            taken  Eliquis (apixaban), last dose was 3 days                            prior to procedure. ASA Grade Assessment: II - A                            patient with mild systemic disease. After reviewing                            the risks and benefits, the patient was deemed in                            satisfactory condition to undergo the procedure.                           After obtaining  informed consent, the endoscope was                            passed under direct vision. Throughout the                            procedure, the patient's blood pressure, pulse, and                            oxygen saturations were monitored continuously. The                            [id number] ([id number]) Olympus therapeutic endoscope                            was introduced through the mouth, and advanced to                            the second part of duodenum. The [id number]-[id number]                            ([id number]) Olympus Radial EUS was introduced through                            the mouth, and advanced to the stomach for                            ultrasound examination. The [id number] ([id number])                            Olympus Linear EUS was introduced through the                            mouth, and advanced to the stomach for ultrasound                            examination. The upper EUS was accomplished without  difficulty. The patient tolerated the procedure. Scope In: Scope Out: Findings:      ENDOSCOPIC FINDING: :      No gross lesions were noted in the entire esophagus.      The Z-line was regular and was found 40 cm from the incisors.      A small hiatal hernia was present.      A single 10 mm semi-sessile polyp with no bleeding and no stigmata of       recent bleeding was found on the greater curvature of the stomach - this       was much smaller than previously noted (going along with likely       inflammatory nature of polyp). Preparations were made for mucosal       resection. Orise gel was injected to raise the lesion. Snare mucosal       resection was performed. Resection and retrieval were complete. To       prevent bleeding after mucosal resection, three hemostatic clips were       successfully placed (MR conditional). There was no bleeding during, or       at the end, of the procedure.      Patchy mildly erythematous mucosa  without bleeding was found in the       entire examined stomach. Biopsies were taken with a cold forceps for       histology and Helicobacter pylori testing.      A single 10 mm submucosal papule (nodule) with no bleeding and no       stigmata of recent bleeding was found on the anterior wall of the       stomach.      No gross lesions were noted in the duodenal bulb, in the first portion       of the duodenum and in the second portion of the duodenum.      ENDOSONOGRAPHIC FINDING: :      A round intramural (subepithelial) lesion was found in the body of the       stomach and in the antrum of the stomach. The lesion was hypoechoic and       calcified. Sonographically, the lesion appeared to originate from the       muscularis propria (Layer 4). The lesion measured 14.8 mm (in maximum       thickness). The lesion also measured 14.4 mm in diameter. The outer       endosonographic borders were well defined. Fine needle biopsy was       performed. Color Doppler imaging was utilized prior to needle puncture       to confirm a lack of significant vascular structures within the needle       path. Four passes were made with the 22 gauge SharkCore biopsy needle       using a transduodenal approach. A visible core of tissue was obtained.       Preliminary cytologic examination and touch preps were performed. Final       cytology results are pending.      Endosonographic imaging in the visualized portion of the liver showed no       mass.      No malignant-appearing lymph nodes were visualized in the left gastric       region (level 17), celiac region (level 20) and perigastric region.      The celiac region was visualized. Impression:  EGD Impression:                           - No gross lesions in esophagus.                           - Z-line regular, 40 cm from the incisors.                           - Small hiatal hernia.                           - A single gastric polyp.  Resected via mucosal                            resection. Retrieved. Clips (MR conditional) were                            placed.                           - Erythematous mucosa in the stomach. Biopsied.                           - A single submucosal papule (nodule) found in the                            stomach.                           - No gross lesions in the duodenal bulb, in the                            first portion of the duodenum and in the second                            portion of the duodenum.                           EUS Impression:                           - An intramural (subepithelial) lesion was found in                            the body of the stomach and in the antrum of the                            stomach. The lesion appeared to originate from                            within the muscularis propria (Layer 4). Cytology                            results are pending. 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 Fine needle biopsy                            performed.                           - No malignant-appearing lymph nodes were                            visualized in the left gastric region (level 17),                            celiac region (level 20) and perigastric region. Moderate Sedation:      Not Applicable - Patient had care per Anesthesia. Recommendation:           - The patient will be observed post-procedure,                            until all discharge criteria are met.                           - Discharge patient to home.                           - Patient has a contact number available for                            emergencies. The signs and symptoms of potential                            delayed complications were discussed with the                            patient. Return to normal activities tomorrow.                             Written discharge instructions were provided to the                            patient.                           - Observe patient's clinical course.                           - Await cytology results and await path results.                           - Restart Eliquis in 72 hours.                           - Restart Omeprazole. 40 mg twice daily for 1 month                            (  through January) then once daily thereafter. New                            Rx sent to pharmacy.                           - Based on the final results of the pathology, if                            this is consistent with GIST, then will proceed                            with [place] discussion and likely monitoring via                            surveillance since it is less than 2 cm in size. If                            the biopsies are not confirmatory will likely still                            discuss at [facility] +/- Surgery referral to discuss                            surgical biopsy/resection vs surveillance, again                            since it is less than 2 cm in size.                           - The findings and recommendations were discussed                            with the patient.                           - The findings and recommendations were discussed                            with the patient's family. Procedure Code(s):        --- Professional ---                           [id number], Esophagogastroduodenoscopy, flexible,                            transoral; with endoscopic mucosal resection                           43238, Esophagogastroduodenoscopy, flexible,                            transoral; with transendoscopic ultrasound-guided  intramural or transmural fine needle                            aspiration/biopsy(s), (includes endoscopic                            ultrasound examination limited to the esophagus,                             stomach or duodenum, and adjacent structures) Diagnosis Code(s):        --- Professional ---                           K44.9, Diaphragmatic hernia without obstruction or                            gangrene                           K31.7, Polyp of stomach and duodenum                           K31.89, Other diseases of stomach and duodenum                           I89.9, Noninfective disorder of lymphatic vessels                            and lymph nodes, unspecified                           K92.9, Disease of digestive system, unspecified                           Q65.78, Helicobacter pylori [H. pylori] as the                            cause of diseases classified elsewhere                           R93.3, Abnormal findings on diagnostic imaging of                            other parts of digestive tract CPT copyright 2019 [employer]. All rights reserved. The codes documented in this report are preliminary and upon coder review may  be revised to meet current compliance requirements. Justice Britain, MD 12/09/2019 9:29:14 AM Number of Addenda: 0

## 2019-12-09 NOTE — H&P (Signed)
GASTROENTEROLOGY PROCEDURE H&P NOTE   Primary Care Physician: Girtha Rm, NP-C  HPI: Claudia Cantrell is a 73 y.o. female who presents for EGD/EUS with EMR.  Past Medical History:  Diagnosis Date  . Arthritis    rheumatoid  . CAD (coronary artery disease)    a. 1992 s/p MI and PTCA of unknown vessel;  b. 09/2010 Cath: LM nl, LAD 20p, LCX 63m, RCA 9m, RPL 20.  Marland Kitchen Cervical cancer ([place]) 1977  . Family history of anesthesia complication    daughter has difficulty waking   . GERD (gastroesophageal reflux disease)    occ  . Gout 10/08/2016  . Hyperlipidemia   . Hypertensive heart disease   . Hypothyroidism   . Nonischemic cardiomyopathy ([place])    a. 07/2016 Echo: EF 40-45%, mild LVH, inferior akinesis, moderately dilated left atrium, trivial AI and MR.  Marland Kitchen PAF (paroxysmal atrial fibrillation) ([place])    a. 07/2016 Admitted w/ AF RVR-->CHA2DS2VASc = 5-->Eliquis;  b. 07/2016 successful TEE/DCCV.  Marland Kitchen Sleep apnea    a. Using CPAP.   Past Surgical History:  Procedure Laterality Date  . ABDOMINAL HYSTERECTOMY  1988  . BACK SURGERY  1994  . BIOPSY  09/02/2019   Procedure: BIOPSY;  Surgeon: Rush Landmark Telford Nab., MD;  Location: [facility];  Service: Gastroenterology;;  . [place]   PTCA BY DR Lamount Cohen  . CARDIOVERSION N/A 08/01/2016   Procedure: CARDIOVERSION;  Surgeon: Lelon Perla, MD;  Location: [facility] ENDOSCOPY;  Service: Cardiovascular;  Laterality: N/A;  . ESOPHAGOGASTRODUODENOSCOPY (EGD) WITH PROPOFOL N/A 09/02/2019   Procedure: ESOPHAGOGASTRODUODENOSCOPY (EGD) WITH PROPOFOL;  Surgeon: Irving Copas., MD;  [facility]: [facility] ENDOSCOPY;  Service: Gastroenterology;  Laterality: N/A;  . EUS N/A 09/02/2019   Procedure: UPPER ENDOSCOPIC ULTRASOUND (EUS) RADIAL;  Surgeon: Rush Landmark Telford Nab., MD;  Location: [facility] ENDOS[facility];  Service: Gastroenterology;  Laterality: N/A;  EUS radial/linear  . FINE NEEDLE ASPIRATION  09/02/2019   Procedure: FINE  NEEDLE ASPIRATION (FNA) LINEAR;  Surgeon: Irving Copas., MD;  [facility];  Service: Gastroenterology;;  . ORIF ANKLE FRACTURE Right 04/27/2015   Procedure: OPEN REDUCTION INTERNAL FIXATION (ORIF) RIGHT BIMALLEOLAR ANKLE FRACTURE WITH SYNDESMOSIS FIXATION;  Surgeon: Leandrew Koyanagi, MD;  Location: [place];  Service: Orthopedics;  Laterality: Right;  . TEE WITHOUT CARDIOVERSION N/A 08/01/2016   Procedure: TRANSESOPHAGEAL ECHOCARDIOGRAM (TEE);  Surgeon: Lelon Perla, MD;  Location: [facility] ENDOSCOPY;  Service: Cardiovascular;  Laterality: N/A;  . THYROIDECTOMY  11/24/2013   DR Dalbert Batman  . THYROIDECTOMY N/A 11/24/2013   Procedure: TOTAL THYROIDECTOMY;  Surgeon: Adin Hector, MD;  Location: [place];  Service: General;  Laterality: N/A;  . TRANSTHORACIC ECHOCARDIOGRAM  01/15/2013   EF 55% TO 65%. PROBABLE MILD HYPOKINESIS OF THE INFERIOR MYOCARDIUM. GRADE 1 DIASTOLIC DYSFUNCTION. TRIAL AR.LA IS MILDLY DILATED.   Current Facility-Administered Medications  Medication Dose Route Frequency Provider Last Rate Last Admin  . 0.9 %  sodium chloride infusion   Intravenous Continuous Mansouraty, Telford Nab., MD      . fentaNYL (SUBLIMAZE) injection 25-50 mcg  25-50 mcg Intravenous Q5 min PRN Belinda Block, MD      . lactated ringers infusion   Intravenous Continuous Mansouraty, Telford Nab., MD       Allergies  Allergen Reactions  . Labetalol     headaches  . Codeine Anxiety   Family History  Problem Relation Age of Onset  . Heart disease Mother   . Sudden death Mother   .  Diabetes Father   . Stroke Father   . Bone cancer Sister   . Sudden death Brother   . Melanoma Brother   . Heart disease Brother   . Colon cancer Neg Hx   . Esophageal cancer Neg Hx   . Inflammatory bowel disease Neg Hx   . Liver disease Neg Hx   . Pancreatic cancer Neg Hx   . Rectal cancer Neg Hx   . Stomach cancer Neg Hx    Social History   Socioeconomic History  . Marital status: Widowed    Spouse  name: Not on file  . Number of children: 3  . Years of education: Not on file  . Highest education level: Not on file  Occupational History  . Occupation: retired  Tobacco Use  . Smoking status: Former Smoker    Packs/day: 1.00    Years: 15.00    Pack years: 15.00    Types: Cigarettes    Quit date: 12/10/1990    Years since quitting: 29.0  . Smokeless tobacco: Never Used  . Tobacco comment: occ alcohol  Substance and Sexual Activity  . Alcohol use: Yes    Comment: very rare  . Drug use: No  . Sexual activity: Not on file  Other Topics Concern  . Not on file  Social History Narrative  . Not on file   Social Determinants of Health   Financial Resource Strain:   . Difficulty of Paying Living Expenses: Not on file  Food Insecurity:   . Worried About Charity fundraiser in the Last Year: Not on file  . Ran Out of Food in the Last Year: Not on file  Transportation Needs:   . Lack of Transportation (Medical): Not on file  . Lack of Transportation (Non-Medical): Not on file  Physical Activity:   . Days of Exercise per Week: Not on file  . Minutes of Exercise per Session: Not on file  Stress:   . Feeling of Stress : Not on file  Social Connections:   . Frequency of Communication with Friends and Family: Not on file  . Frequency of Social Gatherings with Friends and Family: Not on file  . Attends Religious Services: Not on file  . Active Member of Clubs or Organizations: Not on file  . Attends Archivist Meetings: Not on file  . Marital Status: Not on file  Intimate Partner Violence:   . Fear of Current or Ex-Partner: Not on file  . Emotionally Abused: Not on file  . Physically Abused: Not on file  . Sexually Abused: Not on file    Physical Exam: Vital signs in last 24 hours: Temp:  [98.6 F (37 C)] 98.6 F (37 C) (12/30 0639) Pulse Rate:  [73] 73 (12/30 0639) Resp:  [20] 20 (12/30 0639) BP: (154)/(85) 154/85 (12/30 0639) SpO2:  [100 %] 100 % (12/30 0639)  Weight:  [93.4 kg] 93.4 kg (12/30 0639)   GEN: NAD EYE: Sclerae anicteric ENT: MMM CV: Non-tachycardic GI: Soft, NT/ND NEURO:  Alert & Oriented x 3  Lab Results: No results for input(s): WBC, HGB, HCT, PLT in the last 72 hours. BMET No results for input(s): NA, K, CL, CO2, GLUCOSE, BUN, CREATININE, CALCIUM in the last 72 hours. LFT No results for input(s): PROT, ALBUMIN, AST, ALT, ALKPHOS, BILITOT, BILIDIR, IBILI in the last 72 hours. PT/INR No results for input(s): LABPROT, INR in the last 72 hours.   Impression / Plan: This is a 73 y.o.female who  presents for EGD/EUS with EMR.  The risks of EUS including bleeding, infection, aspiration pneumonia and intestinal perforation were discussed as was the possibility it may not give a definitive diagnosis.  If a biopsy of the pancreas is done as part of the EUS, there is an additional risk of pancreatitis at the rate of about 1%.  It was explained that procedure related pancreatitis is typically mild, although can be severe and even life threatening, which is why we do not perform random pancreatic biopsies and only biopsy a lesion we feel is concerning enough to warrant the risk.  The risks and benefits of endoscopic evaluation were discussed with the patient; these include but are not limited to the risk of perforation, infection, bleeding, missed lesions, lack of diagnosis, severe illness requiring hospitalization, as well as anesthesia and sedation related illnesses.  The patient is agreeable to proceed.    Justice Britain, MD [facility] Office # [phone]

## 2019-12-09 NOTE — Anesthesia Postprocedure Evaluation (Signed)
Anesthesia Post Note  Patient: HAJA FLORA  Procedure(s) Performed: UPPER ENDOSCOPIC ULTRASOUND (EUS) RADIAL (N/A ) ESOPHAGOGASTRODUODENOSCOPY (EGD) WITH PROPOFOL (N/A ) ENDOSCOPIC MUCOSAL RESECTION (N/A ) UPPER ENDOSCOPIC ULTRASOUND (EUS) LINEAR FINE NEEDLE ASPIRATION (FNA) LINEAR BIOPSY SUBMUCOSAL LIFTING INJECTION HEMOSTASIS CLIP PLACEMENT     Patient location during evaluation: Endoscopy Anesthesia Type: General Level of consciousness: awake Pain management: pain level controlled Vital Signs Assessment: post-procedure vital signs reviewed and stable Respiratory status: spontaneous breathing Cardiovascular status: stable Postop Assessment: no apparent nausea or vomiting Anesthetic complications: no    Last Vitals:  Vitals:   12/09/19 0920 12/09/19 0930  BP: (!) 157/78 (!) 167/79  Pulse: 99 64  Resp: 15 10  Temp:    SpO2: 97% 98%    Last Pain:  Vitals:   12/09/19 0930  TempSrc:   PainSc: 0-No pain                 Kaelynne Christley

## 2019-12-10 ENCOUNTER — Other Ambulatory Visit: Payer: Self-pay

## 2019-12-10 ENCOUNTER — Encounter: Payer: Self-pay | Admitting: *Deleted

## 2019-12-14 ENCOUNTER — Encounter: Payer: Self-pay | Admitting: Gastroenterology

## 2019-12-14 LAB — SURGICAL PATHOLOGY

## 2019-12-23 ENCOUNTER — Encounter: Payer: Self-pay | Admitting: Cardiology

## 2019-12-23 ENCOUNTER — Telehealth: Payer: Self-pay | Admitting: Gastroenterology

## 2019-12-23 ENCOUNTER — Ambulatory Visit (INDEPENDENT_AMBULATORY_CARE_PROVIDER_SITE_OTHER): Payer: PPO | Admitting: Cardiology

## 2019-12-23 ENCOUNTER — Other Ambulatory Visit: Payer: Self-pay

## 2019-12-23 VITALS — BP 130/76 | HR 82 | Temp 97.2°F | Ht 65.0 in | Wt 211.0 lb

## 2019-12-23 DIAGNOSIS — N182 Chronic kidney disease, stage 2 (mild): Secondary | ICD-10-CM

## 2019-12-23 DIAGNOSIS — Z7901 Long term (current) use of anticoagulants: Secondary | ICD-10-CM | POA: Diagnosis not present

## 2019-12-23 DIAGNOSIS — Z9861 Coronary angioplasty status: Secondary | ICD-10-CM | POA: Diagnosis not present

## 2019-12-23 DIAGNOSIS — I2510 Atherosclerotic heart disease of native coronary artery without angina pectoris: Secondary | ICD-10-CM

## 2019-12-23 DIAGNOSIS — G4733 Obstructive sleep apnea (adult) (pediatric): Secondary | ICD-10-CM | POA: Diagnosis not present

## 2019-12-23 DIAGNOSIS — I428 Other cardiomyopathies: Secondary | ICD-10-CM

## 2019-12-23 DIAGNOSIS — I480 Paroxysmal atrial fibrillation: Secondary | ICD-10-CM

## 2019-12-23 DIAGNOSIS — Z9989 Dependence on other enabling machines and devices: Secondary | ICD-10-CM

## 2019-12-23 DIAGNOSIS — D214 Benign neoplasm of connective and other soft tissue of abdomen: Secondary | ICD-10-CM

## 2019-12-23 NOTE — H&P (View-Only) (Signed)
Cardiology Office Note:    Date:  12/23/2019   ID:  Claudia Cantrell, DOB 05/01/1946, MRN [id number]  PCP:  Girtha Rm, NP-C  Cardiologist:  Shelva Majestic, MD  Electrophysiologist:  None   Referring MD: Girtha Rm, NP-C   No chief complaint on file.   History of Present Illness:    Claudia Cantrell is a 74 y.o. female with a hx of CAD, S/P remote PCI in the 90's, OSA-on C-pap, HTN, and PAF s/p DCCV Aug 2017, on Eliquis.  The patient had an endoscopy 09/02/2019.  She was going to have a polyp biopsied but the procedure was aborted secondary to SOB and HTN.  She was to follow up with Dr Claiborne Billings but presented to the ED first on 09/21/2019 with chest pain, CHF, HTN, and AF.  She ruled out for an MI.  She was diuresed and her medications were adjusted for HTN and AF.  Myoview showed scar with some superimposed ischemia.  After review by Dr Harrell Gave it was decided not to proceed with coronary angiogram.   The patient was discharged 09/23/2019.  At discharge it was decided to have the patient complete her GI work up then resume anticoagulation and plan on DCCV  4 weeks after Eliquis resumed.   She presents to the office today for follow-up.  She had her endoscopy on 12/09/2019.  Eliquis was held both before and after the procedure as she had a biopsy.  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  The plan is surveillance at this time.  From a GI standpoint she has been cleared to resume her anticoagulation.  She did resume her anticoagulation on 12/12/2019.  She has not missed any doses.  I had a long discussion with her this morning trying to determine how symptomatic she is in atrial fibrillation.  She does say she has some fatigue with exertion.  I reviewed her EKGs with Dr. Claiborne Billings.  The last time she was in normal sinus rhythm was in July 2019.  The next EKG in March 2020 shows her to be in atrial fibrillation with controlled ventricular response.  After discussion with Dr. Claiborne Billings and the patient we feel it is  reasonable to proceed with an attempted outpatient cardioversion after 4 weeks of anticoagulation.  Based on her duration of atrial fibrillation and her echocardiogram that shows moderate left atrial enlargement she is probably at a pretty good risk for recurrent atrial fibrillation.  At that time we can determine whether antiarrhythmic therapy and repeat cardioversion would be indicated.  Past Medical History:  Diagnosis Date  . Arthritis    rheumatoid  . CAD (coronary artery disease)    a. 1992 s/p MI and PTCA of unknown vessel;  b. 09/2010 Cath: LM nl, LAD 20p, LCX 8m, RCA 97m, RPL 20.  Marland Kitchen Cervical cancer ([place]) 1977  . Family history of anesthesia complication    daughter has difficulty waking   . GERD (gastroesophageal reflux disease)    occ  . Gout 10/08/2016  . Hyperlipidemia   . Hypertensive heart disease   . Hypothyroidism   . Nonischemic cardiomyopathy ([place])    a. 07/2016 Echo: EF 40-45%, mild LVH, inferior akinesis, moderately dilated left atrium, trivial AI and MR.  Marland Kitchen PAF (paroxysmal atrial fibrillation) ([place])    a. 07/2016 Admitted w/ AF RVR-->CHA2DS2VASc = 5-->Eliquis;  b. 07/2016 successful TEE/DCCV.  Marland Kitchen Sleep apnea    a. Using CPAP.    Past Surgical History:  Procedure Laterality Date  .  ABDOMINAL HYSTERECTOMY  1988  . BACK SURGERY  1994  . BIOPSY  09/02/2019   Procedure: BIOPSY;  Surgeon: Rush Landmark Telford Nab., MD;  Location: [facility];  Service: Gastroenterology;;  . BIOPSY  12/09/2019   Procedure: BIOPSY;  Surgeon: Irving Copas., MD;  Location: [facility];  Service: Gastroenterology;;  . [place]   PTCA BY DR Lamount Cohen  . CARDIOVERSION N/A 08/01/2016   Procedure: CARDIOVERSION;  Surgeon: Lelon Perla, MD;  Location: [place];  Service: Cardiovascular;  Laterality: N/A;  . ENDOSCOPIC MUCOSAL RESECTION N/A 12/09/2019   Procedure: ENDOSCOPIC MUCOSAL RESECTION;  Surgeon: Rush Landmark Telford Nab., MD;  Location:  [facility];  Service: Gastroenterology;  Laterality: N/A;  . ESOPHAGOGASTRODUODENOSCOPY (EGD) WITH PROPOFOL N/A 09/02/2019   Procedure: ESOPHAGOGASTRODUODENOSCOPY (EGD) WITH PROPOFOL;  Surgeon: Rush Landmark Telford Nab., MD;  [facility];  Service: Gastroenterology;  Laterality: N/A;  . ESOPHAGOGASTRODUODENOSCOPY (EGD) WITH PROPOFOL N/A 12/09/2019   Procedure: ESOPHAGOGASTRODUODENOSCOPY (EGD) WITH PROPOFOL;  Surgeon: Rush Landmark Telford Nab., MD;  [facility];  Service: Gastroenterology;  Laterality: N/A;  . EUS N/A 09/02/2019   Procedure: UPPER ENDOSCOPIC ULTRASOUND (EUS) RADIAL;  Surgeon: Rush Landmark Telford Nab., MD;  [facility];  Service: Gastroenterology;  Laterality: N/A;  EUS radial/linear  . EUS N/A 12/09/2019   Procedure: UPPER ENDOSCOPIC ULTRASOUND (EUS) RADIAL;  Surgeon: Irving Copas., MD;  [facility];  Service: Gastroenterology;  Laterality: N/A;  . EUS  12/09/2019   Procedure: UPPER ENDOSCOPIC ULTRASOUND (EUS) LINEAR;  Surgeon: Irving Copas., MD;  [facility];  Service: Gastroenterology;;  . FINE NEEDLE ASPIRATION  09/02/2019   Procedure: FINE NEEDLE ASPIRATION (FNA) LINEAR;  Surgeon: Irving Copas., MD;  [facility];  Service: Gastroenterology;;  . FINE NEEDLE ASPIRATION  12/09/2019   Procedure: FINE NEEDLE ASPIRATION (FNA) LINEAR;  Surgeon: Irving Copas., MD;  Location: [facility];  Service: Gastroenterology;;  . HEMOSTASIS CLIP PLACEMENT  12/09/2019   Procedure: HEMOSTASIS CLIP PLACEMENT;  Surgeon: Irving Copas., MD;  [facility];  Service: Gastroenterology;;  . ORIF ANKLE FRACTURE Right 04/27/2015   Procedure: OPEN REDUCTION INTERNAL FIXATION (ORIF) RIGHT BIMALLEOLAR ANKLE FRACTURE WITH SYNDESMOSIS FIXATION;  Surgeon: Leandrew Koyanagi, MD;  Location: [place];  Service: Orthopedics;  Laterality: Right;  . SUBMUCOSAL LIFTING INJECTION  12/09/2019   Procedure:  SUBMUCOSAL LIFTING INJECTION;  Surgeon: Rush Landmark Telford Nab., MD;  [facility];  Service: Gastroenterology;;  . TEE WITHOUT CARDIOVERSION N/A 08/01/2016   Procedure: TRANSESOPHAGEAL ECHOCARDIOGRAM (TEE);  Surgeon: Lelon Perla, MD;  Location: [facility] ENDOSCOPY;  Service: Cardiovascular;  Laterality: N/A;  . THYROIDECTOMY  11/24/2013   DR Dalbert Batman  . THYROIDECTOMY N/A 11/24/2013   Procedure: TOTAL THYROIDECTOMY;  Surgeon: Adin Hector, MD;  Location: [place];  Service: General;  Laterality: N/A;  . TRANSTHORACIC ECHOCARDIOGRAM  01/15/2013   EF 55% TO 65%. PROBABLE MILD HYPOKINESIS OF THE INFERIOR MYOCARDIUM. GRADE 1 DIASTOLIC DYSFUNCTION. TRIAL AR.LA IS MILDLY DILATED.    Current Medications: Current Meds  Medication Sig  . acetaminophen (TYLENOL) 650 MG CR tablet Take 1,300 mg by mouth every 8 (eight) hours as needed for pain.  Marland Kitchen amLODipine (NORVASC) 10 MG tablet Take 10 mg by mouth daily at 3 pm.   . Biotin w/ Vitamins C & E (HAIR/SKIN/NAILS PO) Take 2,500 mg by mouth daily.  . carvedilol (COREG) 25 MG tablet Take 1 tablet (25 mg total) by mouth 2 (two) times daily with a meal.  .  Cholecalciferol (DIALYVITE VITAMIN D 5000) 125 MCG (5000 UT) capsule Take 5,000 Units by mouth daily.  Marland Kitchen ELIQUIS 5 MG TABS tablet Take 1 tablet by mouth twice daily (Patient taking differently: Take 5 mg by mouth 2 (two) times daily. )  . hydrALAZINE (APRESOLINE) 10 MG tablet Take 1 tablet (10 mg total) by mouth 3 (three) times daily.  . hydrochlorothiazide (MICROZIDE) 12.5 MG capsule Take 1 capsule (12.5 mg total) by mouth daily.  . isosorbide dinitrate (ISORDIL) 5 MG tablet Take 1 tablet (5 mg total) by mouth 3 (three) times daily.  Marland Kitchen olmesartan (BENICAR) 40 MG tablet Take 40 mg by mouth daily.   Marland Kitchen omeprazole (PRILOSEC) 40 MG capsule Take 1 capsule (40 mg total) by mouth 2 (two) times daily before a meal. Take 30 minutes before breakfast and dinner for 1 month.  Then may decrease to 40 mg once daily  before breakfast or before dinner and maintain.  . Probiotic Product (PROBIOTIC DAILY PO) Take 1 tablet by mouth daily.  . rosuvastatin (CRESTOR) 40 MG tablet Take 1 tablet by mouth once daily (Patient taking differently: Take 40 mg by mouth daily. )  . SYNTHROID 112 MCG tablet Take 1 tablet by mouth once daily     Allergies:   Labetalol and Codeine   Social History   Socioeconomic History  . Marital status: Widowed    Spouse name: Not on file  . Number of children: 3  . Years of education: Not on file  . Highest education level: Not on file  Occupational History  . Occupation: retired  Tobacco Use  . Smoking status: Former Smoker    Packs/day: 1.00    Years: 15.00    Pack years: 15.00    Types: Cigarettes    Quit date: 12/10/1990    Years since quitting: 29.0  . Smokeless tobacco: Never Used  . Tobacco comment: occ alcohol  Substance and Sexual Activity  . Alcohol use: Yes    Comment: very rare  . Drug use: No  . Sexual activity: Not on file  Other Topics Concern  . Not on file  Social History Narrative  . Not on file   Social Determinants of Health   Financial Resource Strain:   . Difficulty of Paying Living Expenses: Not on file  Food Insecurity:   . Worried About Charity fundraiser in the Last Year: Not on file  . Ran Out of Food in the Last Year: Not on file  Transportation Needs:   . Lack of Transportation (Medical): Not on file  . Lack of Transportation (Non-Medical): Not on file  Physical Activity:   . Days of Exercise per Week: Not on file  . Minutes of Exercise per Session: Not on file  Stress:   . Feeling of Stress : Not on file  Social Connections:   . Frequency of Communication with Friends and Family: Not on file  . Frequency of Social Gatherings with Friends and Family: Not on file  . Attends Religious Services: Not on file  . Active Member of Clubs or Organizations: Not on file  . Attends Archivist Meetings: Not on file  . Marital  Status: Not on file     Family History: The patient's family history includes Bone cancer in her sister; Diabetes in her father; Heart disease in her brother and mother; Melanoma in her brother; Stroke in her father; Sudden death in her brother and mother. There is no history of Colon cancer, Esophageal  cancer, Inflammatory bowel disease, Liver disease, Pancreatic cancer, Rectal cancer, or Stomach cancer.  ROS:   Please see the history of present illness.     All other systems reviewed and are negative.  EKGs/Labs/Other Studies Reviewed:    The following studies were reviewed today: Echo 09/22/2019  EKG:  EKG is ordered today.  The ekg ordered today demonstrates AF with  [id number]  Recent Labs: 01/14/2019: TSH 1.130 02/11/2019: ALT 35 09/21/2019: B Natriuretic Peptide 858.8 09/23/2019: Hemoglobin 13.6; Magnesium 2.1; Platelets 243 10/07/2019: BUN 15; Creatinine, Ser 0.87; Potassium 4.8; Sodium 140  Recent Lipid Panel    Component Value Date/Time   CHOL 114 09/22/2019 0226   CHOL 148 01/14/2019 1045   TRIG 94 09/22/2019 0226   HDL 34 (L) 09/22/2019 0226   HDL 50 01/14/2019 1045   CHOLHDL 3.4 09/22/2019 0226   VLDL 19 09/22/2019 0226   LDLCALC 61 09/22/2019 0226   LDLCALC 78 01/14/2019 1045    Physical Exam:    VS:  BP 130/76   Pulse 82   Temp (!) 97.2 F (36.2 C) (Temporal)   Ht 5\' 5"  (1.651 m)   Wt 211 lb (95.7 kg)   SpO2 97%   BMI 35.11 kg/m     Wt Readings from Last 3 Encounters:  12/23/19 211 lb (95.7 kg)  12/09/19 206 lb (93.4 kg)  11/03/19 208 lb (94.3 kg)     GEN:  Well nourished, well developed female in no acute distress HEENT: Normal NECK: No JVD; No carotid bruits CARDIAC: irregularly irregular, no murmurs, rubs, gallops RESPIRATORY:  Scattered  rhonchi  ABDOMEN: Soft, non-tender, non-distended MUSCULOSKELETAL:  No edema; No deformity  SKIN: Warm and dry NEUROLOGIC:  Alert and oriented x 3 PSYCHIATRIC:  Normal affect   ASSESSMENT:    AF- After  discussion with the patient she feels like she is fatigued and wants to see if resumption of NSR would make a difference.  Chronic combined systolic and diastolic CHF, NYHA class 3 (HCC) Admitted 09/21/2019-currently stable  CAD S/P percutaneous coronary angioplasty 1992 s/p MI and PTCA of unknown vessel (Dr Sherald Barge)    09/2010 Cath: LM nl, LAD 20p, LCX 21m, RCA 35m, RPL 20. Myoview 09/22/2019- scar with peri-infarct ischemia- medical rx  Nonischemic cardiomyopathy [facility]) 2D Oct 2020- EF 45-50% with moderate LVH  Hypertensive heart disease Echo Oct 2020 shows EF 45-50% with moderate LVH, moderate LAE  OSA on CPAP She reports compliance with C-pap  Chronic anticoagulation- Eliquis resumed 12/12/2019  PLAN:    Schedule OP DCCV after Jan 30th.  Pt understands risks and benefits.    Medication Adjustments/Labs and Tests Ordered: Current medicines are reviewed at length with the patient today.  Concerns regarding medicines are outlined above.  No orders of the defined types were placed in this encounter.  No orders of the defined types were placed in this encounter.   There are no Patient Instructions on file for this visit.   Signed, Kerin Ransom, PA-C  12/23/2019 11:00 AM    [facility]

## 2019-12-23 NOTE — Patient Instructions (Signed)
Medication Instructions:  Your physician recommends that you continue on your current medications as directed. Please refer to the Current Medication list given to you today. *If you need a refill on your cardiac medications before your next appointment, please call your pharmacy*  Lab Work: YOU WILL COMPLETE LABS ONE [place] If you have labs (blood work) drawn today and your tests are completely normal, you will receive your results only by: Marland Kitchen MyChart Message (if you have MyChart) OR . A paper copy in the mail If you have any lab test that is abnormal or we need to change your treatment, we will call you to review the results.  Testing/Procedures: Your physician has recommended that you have a Cardioversion (DCCV). Electrical Cardioversion uses a jolt of electricity to your heart either through paddles or wired patches attached to your chest. This is a controlled, usually prescheduled, procedure. Defibrillation is done under light anesthesia in the hospital, and you usually go home the day of the procedure. This is done to get your heart back into a normal rhythm. You are not awake for the procedure. Please see the instruction sheet given to you today. SEE INSTRUCTIONS BELOW  Follow-Up: At [facility], you and your health needs are our priority.  As part of our continuing mission to provide you with exceptional heart care, we have created designated Provider Care Teams.  These Care Teams include your primary Cardiologist (physician) and Advanced Practice Providers (APPs -  Physician Assistants and Nurse Practitioners) who all work together to provide you with the care you need, when you need it.  Your next appointment:   3-4 week(s) AFTER CARDIOVERSION (01/11/2020)  The format for your next appointment:   In Person  Provider:   Kerin Ransom, PA-C  Other Instructions        CARDIOVERSION INSTRUCTIONS  Dear Claudia Cantrell  You are scheduled for a  Cardioversion. Cardioversion on January 11, 2020 with Dr. Candace Cruise.  Please arrive at the [facility] (Main Entrance A) at [facility]: [address] [place], [place] [address] at 10:00 am. (1 hour prior to procedure unless lab work is needed; if lab work is needed arrive 1.5 hours ahead)  DIET: Nothing to eat or drink after midnight except a sip of water with medications (see medication instructions below)  Medication Instructions: Hold Coreg (Carvedilol) on the morning of procedure Continue your anticoagulant: Eliquis AND DO NOT MISS ANY DOSES You will need to continue your anticoagulant after your procedure until you are told by your  Provider that it is safe to stop   Labs:  Come to the lab at [place] [address] between the hours of 8:00 am and 4:30 pm. You do not have to be fasting.  Covid Testing:  YOU MUST COMPLETE A COVID TEST 4 DAYS PRIOR TO YOUR PROCEDURE. ONCE YOU COMPLETE YOUR COVID TEST YOU MUST REMAIN QUARANTINED UNTIL THE DAY OF YOUR PROCEDURE.  YOUR COVID TEST WILL BE COMPLETED AT THE OLD [place] [facility] ON [address]. LOOK FOR THE SIGNS TO TELL YOU WHICH LINE TO GET IN. YOU WILL HAVE YOUR NOSE SWABBED FROM YOUR VEHICLE.   YOUR APPOINTMENT TIME FOR YOUR TEST IS: January 07, 2020 at 3:10pm.   You must have a responsible person to drive you home and stay in the waiting area during your procedure. Failure to do so could result in cancellation.  Bring your insurance cards.  *Special Note: Every effort is made  to have your procedure done on time. Occasionally there are emergencies that occur at the hospital that may cause delays. Please be patient if a delay does occur.

## 2019-12-23 NOTE — Progress Notes (Signed)
Cardiology Office Note:    Date:  12/23/2019   ID:  AYNSLIE KOWATCH, DOB August 12, 1946, MRN [id number]  PCP:  Girtha Rm, NP-C  Cardiologist:  Shelva Majestic, MD  Electrophysiologist:  None   Referring MD: Girtha Rm, NP-C   No chief complaint on file.   History of Present Illness:    ANGELLENA TYBURSKI is a 74 y.o. female with a hx of CAD, S/P remote PCI in the 90's, OSA-on C-pap, HTN, and PAF s/p DCCV Aug 2017, on Eliquis.  The patient had an endoscopy 09/02/2019.  She was going to have a polyp biopsied but the procedure was aborted secondary to SOB and HTN.  She was to follow up with Dr Claiborne Billings but presented to the ED first on 09/21/2019 with chest pain, CHF, HTN, and AF.  She ruled out for an MI.  She was diuresed and her medications were adjusted for HTN and AF.  Myoview showed scar with some superimposed ischemia.  After review by Dr Harrell Gave it was decided not to proceed with coronary angiogram.   The patient was discharged 09/23/2019.  At discharge it was decided to have the patient complete her GI work up then resume anticoagulation and plan on DCCV  4 weeks after Eliquis resumed.   She presents to the office today for follow-up.  She had her endoscopy on 12/09/2019.  Eliquis was held both before and after the procedure as she had a biopsy.  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  The plan is surveillance at this time.  From a GI standpoint she has been cleared to resume her anticoagulation.  She did resume her anticoagulation on 12/12/2019.  She has not missed any doses.  I had a long discussion with her this morning trying to determine how symptomatic she is in atrial fibrillation.  She does say she has some fatigue with exertion.  I reviewed her EKGs with Dr. Claiborne Billings.  The last time she was in normal sinus rhythm was in July 2019.  The next EKG in March 2020 shows her to be in atrial fibrillation with controlled ventricular response.  After discussion with Dr. Claiborne Billings and the patient we feel it is  reasonable to proceed with an attempted outpatient cardioversion after 4 weeks of anticoagulation.  Based on her duration of atrial fibrillation and her echocardiogram that shows moderate left atrial enlargement she is probably at a pretty good risk for recurrent atrial fibrillation.  At that time we can determine whether antiarrhythmic therapy and repeat cardioversion would be indicated.  Past Medical History:  Diagnosis Date  . Arthritis    rheumatoid  . CAD (coronary artery disease)    a. 1992 s/p MI and PTCA of unknown vessel;  b. 09/2010 Cath: LM nl, LAD 20p, LCX 58m, RCA 39m, RPL 20.  Marland Kitchen Cervical cancer ([place]) 1977  . Family history of anesthesia complication    daughter has difficulty waking   . GERD (gastroesophageal reflux disease)    occ  . Gout 10/08/2016  . Hyperlipidemia   . Hypertensive heart disease   . Hypothyroidism   . Nonischemic cardiomyopathy ([place])    a. 07/2016 Echo: EF 40-45%, mild LVH, inferior akinesis, moderately dilated left atrium, trivial AI and MR.  Marland Kitchen PAF (paroxysmal atrial fibrillation) ([place])    a. 07/2016 Admitted w/ AF RVR-->CHA2DS2VASc = 5-->Eliquis;  b. 07/2016 successful TEE/DCCV.  Marland Kitchen Sleep apnea    a. Using CPAP.    Past Surgical History:  Procedure Laterality Date  .  ABDOMINAL HYSTERECTOMY  1988  . BACK SURGERY  1994  . BIOPSY  09/02/2019   Procedure: BIOPSY;  Surgeon: Rush Landmark Telford Nab., MD;  Location: [facility];  Service: Gastroenterology;;  . BIOPSY  12/09/2019   Procedure: BIOPSY;  Surgeon: Irving Copas., MD;  Location: [facility];  Service: Gastroenterology;;  . [place]   PTCA BY DR Lamount Cohen  . CARDIOVERSION N/A 08/01/2016   Procedure: CARDIOVERSION;  Surgeon: Lelon Perla, MD;  Location: [facility];  Service: Cardiovascular;  Laterality: N/A;  . ENDOSCOPIC MUCOSAL RESECTION N/A 12/09/2019   Procedure: ENDOSCOPIC MUCOSAL RESECTION;  Surgeon: Rush Landmark Telford Nab., MD;  Location:  [facility];  Service: Gastroenterology;  Laterality: N/A;  . ESOPHAGOGASTRODUODENOSCOPY (EGD) WITH PROPOFOL N/A 09/02/2019   Procedure: ESOPHAGOGASTRODUODENOSCOPY (EGD) WITH PROPOFOL;  Surgeon: Rush Landmark Telford Nab., MD;  Location: [facility];  Service: Gastroenterology;  Laterality: N/A;  . ESOPHAGOGASTRODUODENOSCOPY (EGD) WITH PROPOFOL N/A 12/09/2019   Procedure: ESOPHAGOGASTRODUODENOSCOPY (EGD) WITH PROPOFOL;  Surgeon: Rush Landmark Telford Nab., MD;  Location: [facility];  Service: Gastroenterology;  Laterality: N/A;  . EUS N/A 09/02/2019   Procedure: UPPER ENDOSCOPIC ULTRASOUND (EUS) RADIAL;  Surgeon: Rush Landmark Telford Nab., MD;  Location: [facility];  Service: Gastroenterology;  Laterality: N/A;  EUS radial/linear  . EUS N/A 12/09/2019   Procedure: UPPER ENDOSCOPIC ULTRASOUND (EUS) RADIAL;  Surgeon: Irving Copas., MD;  Location: [facility];  Service: Gastroenterology;  Laterality: N/A;  . EUS  12/09/2019   Procedure: UPPER ENDOSCOPIC ULTRASOUND (EUS) LINEAR;  Surgeon: Irving Copas., MD;  Location: [facility];  Service: Gastroenterology;;  . FINE NEEDLE ASPIRATION  09/02/2019   Procedure: FINE NEEDLE ASPIRATION (FNA) LINEAR;  Surgeon: Irving Copas., MD;  Location: [facility];  Service: Gastroenterology;;  . FINE NEEDLE ASPIRATION  12/09/2019   Procedure: FINE NEEDLE ASPIRATION (FNA) LINEAR;  Surgeon: Irving Copas., MD;  Location: [facility];  Service: Gastroenterology;;  . HEMOSTASIS CLIP PLACEMENT  12/09/2019   Procedure: HEMOSTASIS CLIP PLACEMENT;  Surgeon: Irving Copas., MD;  Location: [facility];  Service: Gastroenterology;;  . ORIF ANKLE FRACTURE Right 04/27/2015   Procedure: OPEN REDUCTION INTERNAL FIXATION (ORIF) RIGHT BIMALLEOLAR ANKLE FRACTURE WITH SYNDESMOSIS FIXATION;  Surgeon: Leandrew Koyanagi, MD;  Location: [facility];  Service: Orthopedics;  Laterality: Right;  . SUBMUCOSAL LIFTING INJECTION  12/09/2019   Procedure:  SUBMUCOSAL LIFTING INJECTION;  Surgeon: Rush Landmark Telford Nab., MD;  Location: [facility];  Service: Gastroenterology;;  . TEE WITHOUT CARDIOVERSION N/A 08/01/2016   Procedure: TRANSESOPHAGEAL ECHOCARDIOGRAM (TEE);  Surgeon: Lelon Perla, MD;  Location: [facility];  Service: Cardiovascular;  Laterality: N/A;  . THYROIDECTOMY  11/24/2013   DR Dalbert Batman  . THYROIDECTOMY N/A 11/24/2013   Procedure: TOTAL THYROIDECTOMY;  Surgeon: Adin Hector, MD;  Location: [facility];  Service: General;  Laterality: N/A;  . TRANSTHORACIC ECHOCARDIOGRAM  01/15/2013   EF 55% TO 65%. PROBABLE MILD HYPOKINESIS OF THE INFERIOR MYOCARDIUM. GRADE 1 DIASTOLIC DYSFUNCTION. TRIAL AR.LA IS MILDLY DILATED.    Current Medications: Current Meds  Medication Sig  . acetaminophen (TYLENOL) 650 MG CR tablet Take 1,300 mg by mouth every 8 (eight) hours as needed for pain.  Marland Kitchen amLODipine (NORVASC) 10 MG tablet Take 10 mg by mouth daily at 3 pm.   . Biotin w/ Vitamins C & E (HAIR/SKIN/NAILS PO) Take 2,500 mg by mouth daily.  . carvedilol (COREG) 25 MG tablet Take 1 tablet (25 mg total) by mouth 2 (two) times daily with a meal.  .  Cholecalciferol (DIALYVITE VITAMIN D 5000) 125 MCG (5000 UT) capsule Take 5,000 Units by mouth daily.  Marland Kitchen ELIQUIS 5 MG TABS tablet Take 1 tablet by mouth twice daily (Patient taking differently: Take 5 mg by mouth 2 (two) times daily. )  . hydrALAZINE (APRESOLINE) 10 MG tablet Take 1 tablet (10 mg total) by mouth 3 (three) times daily.  . hydrochlorothiazide (MICROZIDE) 12.5 MG capsule Take 1 capsule (12.5 mg total) by mouth daily.  . isosorbide dinitrate (ISORDIL) 5 MG tablet Take 1 tablet (5 mg total) by mouth 3 (three) times daily.  Marland Kitchen olmesartan (BENICAR) 40 MG tablet Take 40 mg by mouth daily.   Marland Kitchen omeprazole (PRILOSEC) 40 MG capsule Take 1 capsule (40 mg total) by mouth 2 (two) times daily before a meal. Take 30 minutes before breakfast and dinner for 1 month.  Then may decrease to 40 mg once daily  before breakfast or before dinner and maintain.  . Probiotic Product (PROBIOTIC DAILY PO) Take 1 tablet by mouth daily.  . rosuvastatin (CRESTOR) 40 MG tablet Take 1 tablet by mouth once daily (Patient taking differently: Take 40 mg by mouth daily. )  . SYNTHROID 112 MCG tablet Take 1 tablet by mouth once daily     Allergies:   Labetalol and Codeine   Social History   Socioeconomic History  . Marital status: Widowed    Spouse name: Not on file  . Number of children: 3  . Years of education: Not on file  . Highest education level: Not on file  Occupational History  . Occupation: retired  Tobacco Use  . Smoking status: Former Smoker    Packs/day: 1.00    Years: 15.00    Pack years: 15.00    Types: Cigarettes    Quit date: 12/10/1990    Years since quitting: 29.0  . Smokeless tobacco: Never Used  . Tobacco comment: occ alcohol  Substance and Sexual Activity  . Alcohol use: Yes    Comment: very rare  . Drug use: No  . Sexual activity: Not on file  Other Topics Concern  . Not on file  Social History Narrative  . Not on file   Social Determinants of Health   Financial Resource Strain:   . Difficulty of Paying Living Expenses: Not on file  Food Insecurity:   . Worried About Charity fundraiser in the Last Year: Not on file  . Ran Out of Food in the Last Year: Not on file  Transportation Needs:   . Lack of Transportation (Medical): Not on file  . Lack of Transportation (Non-Medical): Not on file  Physical Activity:   . Days of Exercise per Week: Not on file  . Minutes of Exercise per Session: Not on file  Stress:   . Feeling of Stress : Not on file  Social Connections:   . Frequency of Communication with Friends and Family: Not on file  . Frequency of Social Gatherings with Friends and Family: Not on file  . Attends Religious Services: Not on file  . Active Member of Clubs or Organizations: Not on file  . Attends Archivist Meetings: Not on file  . Marital  Status: Not on file     Family History: The patient's family history includes Bone cancer in her sister; Diabetes in her father; Heart disease in her brother and mother; Melanoma in her brother; Stroke in her father; Sudden death in her brother and mother. There is no history of Colon cancer, Esophageal  cancer, Inflammatory bowel disease, Liver disease, Pancreatic cancer, Rectal cancer, or Stomach cancer.  ROS:   Please see the history of present illness.     All other systems reviewed and are negative.  EKGs/Labs/Other Studies Reviewed:    The following studies were reviewed today: Echo 09/22/2019  EKG:  EKG is ordered today.  The ekg ordered today demonstrates AF with  [id number]  Recent Labs: 01/14/2019: TSH 1.130 02/11/2019: ALT 35 09/21/2019: B Natriuretic Peptide 858.8 09/23/2019: Hemoglobin 13.6; Magnesium 2.1; Platelets 243 10/07/2019: BUN 15; Creatinine, Ser 0.87; Potassium 4.8; Sodium 140  Recent Lipid Panel    Component Value Date/Time   CHOL 114 09/22/2019 0226   CHOL 148 01/14/2019 1045   TRIG 94 09/22/2019 0226   HDL 34 (L) 09/22/2019 0226   HDL 50 01/14/2019 1045   CHOLHDL 3.4 09/22/2019 0226   VLDL 19 09/22/2019 0226   LDLCALC 61 09/22/2019 0226   LDLCALC 78 01/14/2019 1045    Physical Exam:    VS:  BP 130/76   Pulse 82   Temp (!) 97.2 F (36.2 C) (Temporal)   Ht 5\' 5"  (1.651 m)   Wt 211 lb (95.7 kg)   SpO2 97%   BMI 35.11 kg/m     Wt Readings from Last 3 Encounters:  12/23/19 211 lb (95.7 kg)  12/09/19 206 lb (93.4 kg)  11/03/19 208 lb (94.3 kg)     GEN:  Well nourished, well developed female in no acute distress HEENT: Normal NECK: No JVD; No carotid bruits CARDIAC: irregularly irregular, no murmurs, rubs, gallops RESPIRATORY:  Scattered  rhonchi  ABDOMEN: Soft, non-tender, non-distended MUSCULOSKELETAL:  No edema; No deformity  SKIN: Warm and dry NEUROLOGIC:  Alert and oriented x 3 PSYCHIATRIC:  Normal affect   ASSESSMENT:    AF- After  discussion with the patient she feels like she is fatigued and wants to see if resumption of NSR would make a difference.  Chronic combined systolic and diastolic CHF, NYHA class 3 (HCC) Admitted 09/21/2019-currently stable  CAD S/P percutaneous coronary angioplasty 1992 s/p MI and PTCA of unknown vessel (Dr Sherald Barge)    09/2010 Cath: LM nl, LAD 20p, LCX 45m, RCA 11m, RPL 20. Myoview 09/22/2019- scar with peri-infarct ischemia- medical rx  Nonischemic cardiomyopathy [facility]) 2D Oct 2020- EF 45-50% with moderate LVH  Hypertensive heart disease Echo Oct 2020 shows EF 45-50% with moderate LVH, moderate LAE  OSA on CPAP She reports compliance with C-pap  Chronic anticoagulation- Eliquis resumed 12/12/2019  PLAN:    Schedule OP DCCV after Jan 30th.  Pt understands risks and benefits.    Medication Adjustments/Labs and Tests Ordered: Current medicines are reviewed at length with the patient today.  Concerns regarding medicines are outlined above.  No orders of the defined types were placed in this encounter.  No orders of the defined types were placed in this encounter.   There are no Patient Instructions on file for this visit.   Signed, Kerin Ransom, PA-C  12/23/2019 11:00 AM    [facility]

## 2019-12-23 NOTE — Telephone Encounter (Signed)
I called and spoke with the patient this morning. We discussed her case at multidisciplinary conference. Reviewed imaging and pathology as well as discussed case with oncology as well as our surgery team and myself from GI. Everything seems to be consistent with this being a sub-2 cm GIST. 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 NCCN guidelines would suggest monitoring and surveillance. To help ease the patient's mind and help her understand things further we will plan to refer to oncology for further discussion about the role of surveillance. Obviously, if the lesion increases in size to greater than 2 cm, we would consider surgical resection. The patient appreciates an opportunity to discuss with oncology. She is appreciative for all the care that she has had. From a GI perspective I see no contraindication to her moving forward with any sort of cardiology studies or ablations or procedures if needed. I will currently plan for a repeat EUS in 1 year time point, but we may consider a cross-sectional CT with contrast. We will see what her oncologist think about.  Patty, please schedule a 1 year follow-up EUS.  Please also place an oncology referral (nonurgent) for evaluation and management of GIST.   Justice Britain, MD [facility] Office # [phone]

## 2019-12-23 NOTE — Addendum Note (Signed)
Addended by: Timothy Lasso on: 12/23/2019 09:40 AM   Modules accepted: Orders

## 2019-12-23 NOTE — Telephone Encounter (Signed)
Recall EUS in Epic and also med onc referral made

## 2019-12-24 ENCOUNTER — Telehealth: Payer: Self-pay | Admitting: Oncology

## 2019-12-24 NOTE — Telephone Encounter (Signed)
Received a new patient referral from Dr. Rush Landmark for non malignant GIST. Claudia Cantrell has been cld and scheduled to see Dr. Benay Spice on 2/2 at 2pm. Pt aware to arrive 15 minutes early.

## 2019-12-24 NOTE — Progress Notes (Signed)
Virtual Visit via Telephone Note   This visit type was conducted due to national recommendations for restrictions regarding the COVID-19 Pandemic (e.g. social distancing) in an effort to limit this patient's exposure and mitigate transmission in our community.  Due to her co-morbid illnesses, this patient is at least at moderate risk for complications without adequate follow up.  This format is felt to be most appropriate for this patient at this time.  The patient did not have access to video technology/had technical difficulties with video requiring transitioning to audio format only (telephone).  All issues noted in this document were discussed and addressed.  No physical exam could be performed with this format.  Please refer to the patient's chart for her  consent to telehealth for [facility].   Date:  11/03/2019  ID:  Claudia Cantrell, DOB Jan 15, 1946, MRN [id number]  Patient Location: Home Provider Location: Home  PCP:  Girtha Rm, NP-C  Cardiologist:  Shelva Majestic, MD  Electrophysiologist:  None   Evaluation Performed:  Follow-Up Visit  Chief Complaint:  none  History of Present Illness:    Claudia Cantrell is a 74 y.o. female with a h/o CAD, S/P remote PCI in the 90'2, OSA-on C-pap, HTN, and PAF on Eliquis.  The patient had an endoscopy 09/02/2019.  She was going to have a gastric polyp biopsied but the procedure was aborted secondary to SOB and HTN (according to the patient).  She was to follow up with Dr Claiborne Billings but presented to the ED first on 09/21/2019 with chest pain, CHF, HTN, and AF.  She ruled out for an MI.  She was diuresed and her medications were adjusted for HTN and AF.  Myoview 09/22/2019 showed scar with some superimposed ischemia.  After review by Dr Harrell Gave it was decided not to proceed with coronary angiogram.   The patient was discharged 09/23/2019 and is seen 11/03/2019 in follow up.  She denies orthopnea. Overall she feels much improved since her  admission.  She is following her B/P at home and watching her sodium intake.   At discharge it was decided to have complete her GI work up then resume anticoagulation and plan on DCCV  4 weeks after Eliquis resumed.   The patient does not have symptoms concerning for COVID-19 infection (fever, chills, cough, or new shortness of breath).    Past Medical History:  Diagnosis Date  . Arthritis    rheumatoid  . CAD (coronary artery disease)    a. 1992 s/p MI and PTCA of unknown vessel;  b. 09/2010 Cath: LM nl, LAD 20p, LCX 60m, RCA 75m, RPL 20.  Marland Kitchen Cervical cancer ([place]) 1977  . Family history of anesthesia complication    daughter has difficulty waking   . GERD (gastroesophageal reflux disease)    occ  . Gout 10/08/2016  . Hyperlipidemia   . Hypertensive heart disease   . Hypothyroidism   . Nonischemic cardiomyopathy ([place])    a. 07/2016 Echo: EF 40-45%, mild LVH, inferior akinesis, moderately dilated left atrium, trivial AI and MR.  Marland Kitchen PAF (paroxysmal atrial fibrillation) ([place])    a. 07/2016 Admitted w/ AF RVR-->CHA2DS2VASc = 5-->Eliquis;  b. 07/2016 successful TEE/DCCV.  Marland Kitchen Sleep apnea    a. Using CPAP.   Past Surgical History:  Procedure Laterality Date  . ABDOMINAL HYSTERECTOMY  1988  . BACK SURGERY  1994  . BIOPSY  09/02/2019   Procedure: BIOPSY;  Surgeon: Rush Landmark Telford Nab., MD;  Location: [facility];  Service:  Gastroenterology;;  . BIOPSY  12/09/2019   Procedure: BIOPSY;  Surgeon: Irving Copas., MD;  Location: [facility];  Service: Gastroenterology;;  . [place]   PTCA BY DR Lamount Cohen  . CARDIOVERSION N/A 08/01/2016   Procedure: CARDIOVERSION;  Surgeon: Lelon Perla, MD;  Location: [place];  Service: Cardiovascular;  Laterality: N/A;  . ENDOSCOPIC MUCOSAL RESECTION N/A 12/09/2019   Procedure: ENDOSCOPIC MUCOSAL RESECTION;  Surgeon: Rush Landmark Telford Nab., MD;  Location: [facility];  Service: Gastroenterology;   Laterality: N/A;  . ESOPHAGOGASTRODUODENOSCOPY (EGD) WITH PROPOFOL N/A 09/02/2019   Procedure: ESOPHAGOGASTRODUODENOSCOPY (EGD) WITH PROPOFOL;  Surgeon: Rush Landmark Telford Nab., MD;  Location: [facility];  Service: Gastroenterology;  Laterality: N/A;  . ESOPHAGOGASTRODUODENOSCOPY (EGD) WITH PROPOFOL N/A 12/09/2019   Procedure: ESOPHAGOGASTRODUODENOSCOPY (EGD) WITH PROPOFOL;  Surgeon: Rush Landmark Telford Nab., MD;  Location: [facility];  Service: Gastroenterology;  Laterality: N/A;  . EUS N/A 09/02/2019   Procedure: UPPER ENDOSCOPIC ULTRASOUND (EUS) RADIAL;  Surgeon: Rush Landmark Telford Nab., MD;  Location: [facility];  Service: Gastroenterology;  Laterality: N/A;  EUS radial/linear  . EUS N/A 12/09/2019   Procedure: UPPER ENDOSCOPIC ULTRASOUND (EUS) RADIAL;  Surgeon: Irving Copas., MD;  Location: [facility];  Service: Gastroenterology;  Laterality: N/A;  . EUS  12/09/2019   Procedure: UPPER ENDOSCOPIC ULTRASOUND (EUS) LINEAR;  Surgeon: Irving Copas., MD;  Location: [facility];  Service: Gastroenterology;;  . FINE NEEDLE ASPIRATION  09/02/2019   Procedure: FINE NEEDLE ASPIRATION (FNA) LINEAR;  Surgeon: Irving Copas., MD;  Location: [facility];  Service: Gastroenterology;;  . FINE NEEDLE ASPIRATION  12/09/2019   Procedure: FINE NEEDLE ASPIRATION (FNA) LINEAR;  Surgeon: Irving Copas., MD;  Location: [facility];  Service: Gastroenterology;;  . HEMOSTASIS CLIP PLACEMENT  12/09/2019   Procedure: HEMOSTASIS CLIP PLACEMENT;  Surgeon: Irving Copas., MD;  Location: [facility];  Service: Gastroenterology;;  . ORIF ANKLE FRACTURE Right 04/27/2015   Procedure: OPEN REDUCTION INTERNAL FIXATION (ORIF) RIGHT BIMALLEOLAR ANKLE FRACTURE WITH SYNDESMOSIS FIXATION;  Surgeon: Leandrew Koyanagi, MD;  Location: [place];  Service: Orthopedics;  Laterality: Right;  . SUBMUCOSAL LIFTING INJECTION  12/09/2019   Procedure: SUBMUCOSAL LIFTING INJECTION;  Surgeon:  Rush Landmark Telford Nab., MD;  Location: [facility];  Service: Gastroenterology;;  . TEE WITHOUT CARDIOVERSION N/A 08/01/2016   Procedure: TRANSESOPHAGEAL ECHOCARDIOGRAM (TEE);  Surgeon: Lelon Perla, MD;  Location: [facility];  Service: Cardiovascular;  Laterality: N/A;  . THYROIDECTOMY  11/24/2013   DR Dalbert Batman  . THYROIDECTOMY N/A 11/24/2013   Procedure: TOTAL THYROIDECTOMY;  Surgeon: Adin Hector, MD;  Location: [place];  Service: General;  Laterality: N/A;  . TRANSTHORACIC ECHOCARDIOGRAM  01/15/2013   EF 55% TO 65%. PROBABLE MILD HYPOKINESIS OF THE INFERIOR MYOCARDIUM. GRADE 1 DIASTOLIC DYSFUNCTION. TRIAL AR.LA IS MILDLY DILATED.     Current Meds  Medication Sig  . acetaminophen (TYLENOL) 650 MG CR tablet Take 1,300 mg by mouth every 8 (eight) hours as needed for pain.  Marland Kitchen amLODipine (NORVASC) 10 MG tablet Take 10 mg by mouth daily at 3 pm.   . carvedilol (COREG) 25 MG tablet Take 1 tablet (25 mg total) by mouth 2 (two) times daily with a meal.  . Cholecalciferol (DIALYVITE VITAMIN D 5000) 125 MCG (5000 UT) capsule Take 5,000 Units by mouth daily.  Marland Kitchen ELIQUIS 5 MG TABS tablet Take 1 tablet by mouth twice daily (Patient taking differently: Take 5 mg by mouth 2 (two) times daily. )  . hydrALAZINE (APRESOLINE) 10  MG tablet Take 1 tablet (10 mg total) by mouth 3 (three) times daily.  . isosorbide dinitrate (ISORDIL) 5 MG tablet Take 1 tablet (5 mg total) by mouth 3 (three) times daily.  Marland Kitchen olmesartan (BENICAR) 40 MG tablet Take 40 mg by mouth daily.   . rosuvastatin (CRESTOR) 40 MG tablet Take 1 tablet by mouth once daily (Patient taking differently: Take 40 mg by mouth daily. )  . [DISCONTINUED] omeprazole (PRILOSEC) 40 MG capsule Take 1 capsule (40 mg total) by mouth daily. Take 30 minutes before breakfast or dinner (Patient not taking: Reported on 11/25/2019)  . [DISCONTINUED] SYNTHROID 112 MCG tablet Take 1 tablet by mouth once daily (Patient taking differently: Take 112 mcg by mouth  daily before breakfast. )     Allergies:   Labetalol and Codeine   Social History   Tobacco Use  . Smoking status: Former Smoker    Packs/day: 1.00    Years: 15.00    Pack years: 15.00    Types: Cigarettes    Quit date: 12/10/1990    Years since quitting: 29.0  . Smokeless tobacco: Never Used  . Tobacco comment: occ alcohol  Substance Use Topics  . Alcohol use: Yes    Comment: very rare  . Drug use: No     Family Hx: The patient's family history includes Bone cancer in her sister; Diabetes in her father; Heart disease in her brother and mother; Melanoma in her brother; Stroke in her father; Sudden death in her brother and mother. There is no history of Colon cancer, Esophageal cancer, Inflammatory bowel disease, Liver disease, Pancreatic cancer, Rectal cancer, or Stomach cancer.  ROS:   Please see the history of present illness.    All other systems reviewed and are negative.   Prior CV studies:   The following studies were reviewed today: Echo 09/22/2019  Labs/Other Tests and Data Reviewed:    EKG:  No ECG reviewed.  Recent Labs: 01/14/2019: TSH 1.130 02/11/2019: ALT 35 09/21/2019: B Natriuretic Peptide 858.8 09/23/2019: Hemoglobin 13.6; Magnesium 2.1; Platelets 243 10/07/2019: BUN 15; Creatinine, Ser 0.87; Potassium 4.8; Sodium 140   Recent Lipid Panel Lab Results  Component Value Date/Time   CHOL 114 09/22/2019 02:26 AM   CHOL 148 01/14/2019 10:45 AM   TRIG 94 09/22/2019 02:26 AM   HDL 34 (L) 09/22/2019 02:26 AM   HDL 50 01/14/2019 10:45 AM   CHOLHDL 3.4 09/22/2019 02:26 AM   LDLCALC 61 09/22/2019 02:26 AM   LDLCALC 78 01/14/2019 10:45 AM    Wt Readings from Last 3 Encounters:  12/23/19 211 lb (95.7 kg)  12/09/19 206 lb (93.4 kg)  11/03/19 208 lb (94.3 kg)     Objective:    Vital Signs:  BP (!) 125/92   Pulse 87   Ht 5\' 5"  (1.651 m)   Wt 208 lb (94.3 kg)   BMI 34.61 kg/m    VITAL SIGNS:  reviewed  ASSESSMENT & PLAN:    Acute combined systolic  and diastolic CHF, NYHA class 3 ([place]) Admitted 09/21/2019  CAD S/P percutaneous coronary angioplasty 1992 s/p MI and PTCA of unknown vessel (Dr Sherald Barge)    09/2010 Cath: LM nl, LAD 20p, LCX 54m, RCA 75m, RPL 20. Myoview 09/22/2019- scar with peri-infarct ischemia- medical rx  Nonischemic cardiomyopathy [facility]) 2D Oct 2020- EF 45-50% with moderate LVH   Hypertensive heart disease Echo Oct 2020 shows EF 45-50% with moderate LVH. B/P stable  OSA on CPAP She reports compliance with C-pap  COVID-19  Education: The signs and symptoms of COVID-19 were discussed with the patient and how to seek care for testing (follow up with PCP or arrange E-visit).  The importance of social distancing was discussed today.  Time:   Today, I have spent 10 minutes with the patient with telehealth technology discussing the above problems.     Medication Adjustments/Labs and Tests Ordered: Current medicines are reviewed at length with the patient today.  Concerns regarding medicines are outlined above.   Tests Ordered: No orders of the defined types were placed in this encounter.   Medication Changes: Meds ordered this encounter  Medications  . hydrochlorothiazide (MICROZIDE) 12.5 MG capsule    Sig: Take 1 capsule (12.5 mg total) by mouth daily.    Dispense:  90 capsule    Refill:  1    Follow Up:  In Person In Jan 2021 after her endoscopy  Signed, Kerin Ransom, Hershal Coria  12/24/2019 2:01 PM    [place]

## 2019-12-29 ENCOUNTER — Ambulatory Visit: Payer: PPO | Attending: Internal Medicine

## 2019-12-29 DIAGNOSIS — Z23 Encounter for immunization: Secondary | ICD-10-CM

## 2019-12-29 NOTE — Progress Notes (Signed)
   Covid-19 Vaccination Clinic  Name:  CANDE HAMMERMAN    MRN: [id number] DOB: [birth year]  12/29/2019  Ms. Pless was observed post Covid-19 immunization for 15 minutes without incidence. She was provided with Vaccine Information Sheet and instruction to access the V-Safe system.   Ms. Chouinard was instructed to call 911 with any severe reactions post vaccine: Marland Kitchen Difficulty breathing  . Swelling of your face and throat  . A fast heartbeat  . A bad rash all over your body  . Dizziness and weakness    Immunizations Administered    Name Date Dose VIS Date Route   Pfizer COVID-19 Vaccine 12/29/2019  3:27 PM 0.3 mL 11/20/2019 Intramuscular   Manufacturer: [place]   Lot: [id number]   [place]: [id number]

## 2019-12-30 ENCOUNTER — Other Ambulatory Visit: Payer: Self-pay | Admitting: Cardiovascular Disease

## 2020-01-07 ENCOUNTER — Other Ambulatory Visit (HOSPITAL_COMMUNITY)
Admission: RE | Admit: 2020-01-07 | Discharge: 2020-01-07 | Disposition: A | Discharge status: Home / Self Care | Payer: PPO | Source: Ambulatory Visit | Attending: Cardiovascular Disease | Admitting: Cardiovascular Disease

## 2020-01-07 DIAGNOSIS — N281 Cyst of kidney, acquired: Secondary | ICD-10-CM | POA: Diagnosis not present

## 2020-01-07 DIAGNOSIS — Z01812 Encounter for preprocedural laboratory examination: Secondary | ICD-10-CM | POA: Diagnosis not present

## 2020-01-07 DIAGNOSIS — N183 Chronic kidney disease, stage 3 unspecified: Secondary | ICD-10-CM | POA: Diagnosis not present

## 2020-01-07 DIAGNOSIS — M109 Gout, unspecified: Secondary | ICD-10-CM | POA: Diagnosis not present

## 2020-01-07 DIAGNOSIS — Z20822 Contact with and (suspected) exposure to covid-19: Secondary | ICD-10-CM | POA: Insufficient documentation

## 2020-01-07 DIAGNOSIS — E559 Vitamin D deficiency, unspecified: Secondary | ICD-10-CM | POA: Diagnosis not present

## 2020-01-07 DIAGNOSIS — R809 Proteinuria, unspecified: Secondary | ICD-10-CM | POA: Diagnosis not present

## 2020-01-07 DIAGNOSIS — I129 Hypertensive chronic kidney disease with stage 1 through stage 4 chronic kidney disease, or unspecified chronic kidney disease: Secondary | ICD-10-CM | POA: Diagnosis not present

## 2020-01-07 LAB — SARS CORONAVIRUS 2 (TAT 6-24 HRS): SARS Coronavirus 2: NEGATIVE

## 2020-01-11 ENCOUNTER — Ambulatory Visit (HOSPITAL_COMMUNITY): Payer: PPO

## 2020-01-11 ENCOUNTER — Encounter (HOSPITAL_COMMUNITY): Payer: Self-pay | Admitting: Cardiovascular Disease

## 2020-01-11 ENCOUNTER — Encounter (HOSPITAL_COMMUNITY)
Admission: RE | Disposition: A | Discharge status: Home / Self Care | Payer: PPO | Source: Home / Self Care | Attending: Cardiovascular Disease

## 2020-01-11 ENCOUNTER — Ambulatory Visit (HOSPITAL_COMMUNITY)
Admission: RE | Admit: 2020-01-11 | Discharge: 2020-01-11 | Disposition: A | Discharge status: Home / Self Care | Payer: PPO | Attending: Cardiovascular Disease | Admitting: Cardiovascular Disease

## 2020-01-11 DIAGNOSIS — Z87891 Personal history of nicotine dependence: Secondary | ICD-10-CM | POA: Diagnosis not present

## 2020-01-11 DIAGNOSIS — Z79890 Hormone replacement therapy (postmenopausal): Secondary | ICD-10-CM | POA: Insufficient documentation

## 2020-01-11 DIAGNOSIS — I130 Hypertensive heart and chronic kidney disease with heart failure and stage 1 through stage 4 chronic kidney disease, or unspecified chronic kidney disease: Secondary | ICD-10-CM | POA: Diagnosis not present

## 2020-01-11 DIAGNOSIS — I480 Paroxysmal atrial fibrillation: Secondary | ICD-10-CM | POA: Diagnosis not present

## 2020-01-11 DIAGNOSIS — Z885 Allergy status to narcotic agent status: Secondary | ICD-10-CM | POA: Insufficient documentation

## 2020-01-11 DIAGNOSIS — E039 Hypothyroidism, unspecified: Secondary | ICD-10-CM | POA: Insufficient documentation

## 2020-01-11 DIAGNOSIS — K219 Gastro-esophageal reflux disease without esophagitis: Secondary | ICD-10-CM | POA: Diagnosis not present

## 2020-01-11 DIAGNOSIS — Z79899 Other long term (current) drug therapy: Secondary | ICD-10-CM | POA: Insufficient documentation

## 2020-01-11 DIAGNOSIS — Z888 Allergy status to other drugs, medicaments and biological substances status: Secondary | ICD-10-CM | POA: Insufficient documentation

## 2020-01-11 DIAGNOSIS — I2510 Atherosclerotic heart disease of native coronary artery without angina pectoris: Secondary | ICD-10-CM | POA: Insufficient documentation

## 2020-01-11 DIAGNOSIS — I5042 Chronic combined systolic (congestive) and diastolic (congestive) heart failure: Secondary | ICD-10-CM | POA: Insufficient documentation

## 2020-01-11 DIAGNOSIS — I428 Other cardiomyopathies: Secondary | ICD-10-CM | POA: Insufficient documentation

## 2020-01-11 DIAGNOSIS — G4733 Obstructive sleep apnea (adult) (pediatric): Secondary | ICD-10-CM | POA: Insufficient documentation

## 2020-01-11 DIAGNOSIS — E785 Hyperlipidemia, unspecified: Secondary | ICD-10-CM | POA: Insufficient documentation

## 2020-01-11 DIAGNOSIS — I4819 Other persistent atrial fibrillation: Secondary | ICD-10-CM | POA: Diagnosis not present

## 2020-01-11 DIAGNOSIS — I252 Old myocardial infarction: Secondary | ICD-10-CM | POA: Insufficient documentation

## 2020-01-11 DIAGNOSIS — I4891 Unspecified atrial fibrillation: Secondary | ICD-10-CM

## 2020-01-11 DIAGNOSIS — Z7901 Long term (current) use of anticoagulants: Secondary | ICD-10-CM | POA: Diagnosis not present

## 2020-01-11 DIAGNOSIS — I110 Hypertensive heart disease with heart failure: Secondary | ICD-10-CM | POA: Diagnosis not present

## 2020-01-11 DIAGNOSIS — I5041 Acute combined systolic (congestive) and diastolic (congestive) heart failure: Secondary | ICD-10-CM | POA: Diagnosis not present

## 2020-01-11 DIAGNOSIS — M1990 Unspecified osteoarthritis, unspecified site: Secondary | ICD-10-CM | POA: Insufficient documentation

## 2020-01-11 DIAGNOSIS — Z8249 Family history of ischemic heart disease and other diseases of the circulatory system: Secondary | ICD-10-CM | POA: Diagnosis not present

## 2020-01-11 DIAGNOSIS — N182 Chronic kidney disease, stage 2 (mild): Secondary | ICD-10-CM | POA: Diagnosis not present

## 2020-01-11 HISTORY — PX: CARDIOVERSION: SHX1299

## 2020-01-11 LAB — POCT I-STAT, CHEM 8
BUN: 19 mg/dL (ref 8–23)
Calcium, Ion: 1.14 mmol/L — ABNORMAL LOW (ref 1.15–1.40)
Chloride: 105 mmol/L (ref 98–111)
Creatinine, Ser: 1.1 mg/dL — ABNORMAL HIGH (ref 0.44–1.00)
Glucose, Bld: 92 mg/dL (ref 70–99)
HCT: 46 % (ref 36.0–46.0)
Hemoglobin: 15.6 g/dL — ABNORMAL HIGH (ref 12.0–15.0)
Potassium: 4.1 mmol/L (ref 3.5–5.1)
Sodium: 141 mmol/L (ref 135–145)
TCO2: 29 mmol/L (ref 22–32)

## 2020-01-11 SURGERY — CARDIOVERSION
Anesthesia: General

## 2020-01-11 MED ORDER — SODIUM CHLORIDE 0.9 % IV SOLN: INTRAVENOUS | Status: DC | PRN

## 2020-01-11 MED ORDER — PROPOFOL 10 MG/ML IV BOLUS
INTRAVENOUS | Status: DC | PRN
  Administered 2020-01-11: 50 mg via INTRAVENOUS

## 2020-01-11 MED ORDER — LIDOCAINE 2% (20 MG/ML) 5 ML SYRINGE
INTRAMUSCULAR | Status: DC | PRN
  Administered 2020-01-11: 60 mg via INTRAVENOUS

## 2020-01-11 NOTE — CV Procedure (Signed)
[place]: Anesthesia: Propofol On Rx anticoagulation eliquis no missed doses  DCC x 2 120 J then 200 J Converted from afib to NSR rate 58  No immediate neurologic sequelae  [name] MD [facility]

## 2020-01-11 NOTE — Anesthesia Preprocedure Evaluation (Addendum)
Anesthesia Evaluation  Patient identified by MRN, date of birth, ID band Patient awake    Reviewed: Allergy & Precautions, NPO status , Patient's Chart, lab work & pertinent test results, reviewed documented beta blocker date and time   History of Anesthesia Complications Negative for: history of anesthetic complications  Airway Mallampati: II  TM Distance: >3 FB Neck ROM: Full    Dental  (+) Partial Lower, Partial Upper   Pulmonary sleep apnea , former smoker,    Pulmonary exam normal        Cardiovascular hypertension, Pt. on medications and Pt. on home beta blockers + CAD, + Past MI and +CHF  + dysrhythmias Atrial Fibrillation  Rhythm:Irregular Rate:Bradycardia     Neuro/Psych negative neurological ROS  negative psych ROS   GI/Hepatic Neg liver ROS, GERD  ,  Endo/Other  Hypothyroidism   Renal/GU Renal InsufficiencyRenal disease  negative genitourinary   Musculoskeletal negative musculoskeletal ROS (+)   Abdominal   Peds  Hematology negative hematology ROS (+)   Anesthesia Other Findings Echo 09/22/19: EF 45-50%, normal RV function, mild MR, mild AI, normal PASP  Stress test 2017: no ischemia  Reproductive/Obstetrics                           Anesthesia Physical Anesthesia Plan  ASA: III  Anesthesia Plan: General   Post-op Pain Management:    Induction: Intravenous  PONV Risk Score and Plan: TIVA and Treatment may vary due to age or medical condition  Airway Management Planned: Mask  Additional Equipment: None  Intra-op Plan:   Post-operative Plan:   Informed Consent: I have reviewed the patients History and Physical, chart, labs and discussed the procedure including the risks, benefits and alternatives for the proposed anesthesia with the patient or authorized representative who has indicated his/her understanding and acceptance.       Plan Discussed with:    Anesthesia Plan Comments:        Anesthesia Quick Evaluation

## 2020-01-11 NOTE — Anesthesia Postprocedure Evaluation (Signed)
Anesthesia Post Note  Patient: SHERMIKA DILEONARDO  Procedure(s) Performed: CARDIOVERSION (N/A )     Patient location during evaluation: Endoscopy Anesthesia Type: General Level of consciousness: awake and alert Pain management: pain level controlled Vital Signs Assessment: post-procedure vital signs reviewed and stable Respiratory status: spontaneous breathing, nonlabored ventilation and respiratory function stable Cardiovascular status: blood pressure returned to baseline and stable Postop Assessment: no apparent nausea or vomiting Anesthetic complications: no    Last Vitals:  Vitals:   01/11/20 0957 01/11/20 1007  BP: (!) 141/67 126/60  Pulse: (!) 50 (!) 48  Resp: 17 12  Temp:    SpO2: 97% 99%    Last Pain:  Vitals:   01/11/20 1007  TempSrc:   PainSc: 0-No pain   Pain Goal:                   Lidia Collum

## 2020-01-11 NOTE — Transfer of Care (Signed)
Immediate Anesthesia Transfer of Care Note  Patient: Claudia Cantrell  Procedure(s) Performed: CARDIOVERSION (N/A )  Patient Location: Endoscopy Unit  Anesthesia Type:General  Level of Consciousness: awake, alert  and oriented  Airway & Oxygen Therapy: Patient Spontanous Breathing  Post-op Assessment: Report given to RN and Post -op Vital signs reviewed and stable  Post vital signs: Reviewed and stable  Last Vitals:  Vitals Value Taken Time  BP 110/62 01/11/20 0942  Temp    Pulse 50 01/11/20 0944  Resp 16 01/11/20 0944  SpO2 93 % 01/11/20 0944    Last Pain:  Vitals:   01/11/20 0859  TempSrc: Oral  PainSc: 0-No pain         Complications: No apparent anesthesia complications

## 2020-01-11 NOTE — Discharge Instructions (Signed)
Electrical Cardioversion Electrical cardioversion is the delivery of a jolt of electricity to restore a normal rhythm to the heart. A rhythm that is too fast or is not regular keeps the heart from pumping well. In this procedure, sticky patches or metal paddles are placed on the chest to deliver electricity to the heart from a device. This procedure may be done in an emergency if:  There is low or no blood pressure as a result of the heart rhythm.  Normal rhythm must be restored as fast as possible to protect the brain and heart from further damage.  It may save a life. This may also be a scheduled procedure for irregular or fast heart rhythms that are not immediately life-threatening. Tell a health care provider about:  Any allergies you have.  All medicines you are taking, including vitamins, herbs, eye drops, creams, and over-the-counter medicines.  Any problems you or family members have had with anesthetic medicines.  Any blood disorders you have.  Any surgeries you have had.  Any medical conditions you have.  Whether you are pregnant or may be pregnant. What are the risks? Generally, this is a safe procedure. However, problems may occur, including:  Allergic reactions to medicines.  A blood clot that breaks free and travels to other parts of your body.  The possible return of an abnormal heart rhythm within hours or days after the procedure.  Your heart stopping (cardiac arrest). This is rare. What happens before the procedure? Medicines  Your health care provider may have you start taking: ? Blood-thinning medicines (anticoagulants) so your blood does not clot as easily. ? Medicines to help stabilize your heart rate and rhythm.  Ask your health care provider about: ? Changing or stopping your regular medicines. This is especially important if you are taking diabetes medicines or blood thinners. ? Taking medicines such as aspirin and ibuprofen. These medicines can  thin your blood. Do not take these medicines unless your health care provider tells you to take them. ? Taking over-the-counter medicines, vitamins, herbs, and supplements. General instructions  Follow instructions from your health care provider about eating or drinking restrictions.  Plan to have someone take you home from the hospital or clinic.  If you will be going home right after the procedure, plan to have someone with you for 24 hours.  Ask your health care provider what steps will be taken to help prevent infection. These may include washing your skin with a germ-killing soap. What happens during the procedure?   An IV will be inserted into one of your veins.  Sticky patches (electrodes) or metal paddles may be placed on your chest.  You will be given a medicine to help you relax (sedative).  An electrical shock will be delivered. The procedure may vary among health care providers and hospitals. What can I expect after the procedure?  Your blood pressure, heart rate, breathing rate, and blood oxygen level will be monitored until you leave the hospital or clinic.  Your heart rhythm will be watched to make sure it does not change.  You may have some redness on the skin where the shocks were given. Follow these instructions at home:  Do not drive for 24 hours if you were given a sedative during your procedure.  Take over-the-counter and prescription medicines only as told by your health care provider.  Ask your health care provider how to check your pulse. Check it often.  Rest for 48 hours after the procedure or   as told by your health care provider.  Avoid or limit your caffeine use as told by your health care provider.  Keep all follow-up visits as told by your health care provider. This is important. Contact a health care provider if:  You feel like your heart is beating too quickly or your pulse is not regular.  You have a serious muscle cramp that does not go  away. Get help right away if:  You have discomfort in your chest.  You are dizzy or you feel faint.  You have trouble breathing or you are short of breath.  Your speech is slurred.  You have trouble moving an arm or leg on one side of your body.  Your fingers or toes turn cold or blue. Summary  Electrical cardioversion is the delivery of a jolt of electricity to restore a normal rhythm to the heart.  This procedure may be done right away in an emergency or may be a scheduled procedure if the condition is not an emergency.  Generally, this is a safe procedure.  After the procedure, check your pulse often as told by your health care provider. This information is not intended to replace advice given to you by your health care provider. Make sure you discuss any questions you have with your health care provider. Document Revised: 06/29/2019 Document Reviewed: 06/29/2019 Elsevier Patient Education  2020 Elsevier Inc.  

## 2020-01-11 NOTE — Interval H&P Note (Signed)
History and Physical Interval Note:  01/11/2020 8:47 AM  Claudia Cantrell  has presented today for surgery, with the diagnosis of ATRIAL FIBRILLATION.  The various methods of treatment have been discussed with the patient and family. After consideration of risks, benefits and other options for treatment, the patient has consented to  Procedure(s): CARDIOVERSION (N/A) as a surgical intervention.  The patient's history has been reviewed, patient examined, no change in status, stable for surgery.  I have reviewed the patient's chart and labs.  Questions were answered to the patient's satisfaction.     [name]

## 2020-01-11 NOTE — Anesthesia Procedure Notes (Signed)
Date/Time: 01/11/2020 9:36 AM Performed by: Alain Marion, CRNA Pre-anesthesia Checklist: Patient identified, Emergency Drugs available, Suction available and Patient being monitored Oxygen Delivery Method: Ambu bag Placement Confirmation: positive ETCO2

## 2020-01-12 ENCOUNTER — Other Ambulatory Visit: Payer: Self-pay

## 2020-01-12 ENCOUNTER — Inpatient Hospital Stay: Payer: PPO | Attending: Oncology | Admitting: Oncology

## 2020-01-12 VITALS — BP 108/49 | HR 48 | Temp 97.9°F | Resp 18 | Ht 65.0 in | Wt 215.0 lb

## 2020-01-12 DIAGNOSIS — C49A2 Gastrointestinal stromal tumor of stomach: Secondary | ICD-10-CM

## 2020-01-12 NOTE — Progress Notes (Signed)
[facility] Patient Consult   Requesting MD: [facility]  MOSELLA KASA 74 y.o.  [birth year]    Reason for Consult: Gastrointestinal stromal tumor of the stomach   HPI: Ms. Ohagan reports being referred to nephrology for evaluation of proteinuria.  Her renal ultrasound on 04/23/2019 revealed a partially exophytic mass in the midpole of the left kidney.  A CT of the abdomen pelvis on 05/07/2019 revealed a benign cyst in the left kidney.  No renal masses were identified.  An enhancing 1.4 cm nodule was noted at the serosal surface of the greater curvature of the distal gastric body.  This was suspicious for a small gastrointestinal stromal tumor.  No pathologically enlarged lymph nodes.  She was referred to Dr. Rush Landmark was taken to an endoscopic ultrasound on 09/02/2019.  Erythema was noted in the entire stomach.  Biopsies were taken.  A 9 mm submucosal papule was found on the greater curvature of the gastric body consistent with the location of the lesion seen on CT.  A 20 mm polyp was found on the posterior wall of the stomach.  The plan was for resection, but she developed an arrhythmia and the resection was aborted.  An ultrasound confirmed a greater curvature subepithelial lesion at the greater curvature of the stomach measuring 15 mm.  A fine-needle biopsy was performed.  No malignant appearing nodes were noted in the left gastric, gastrohepatic ligament, celiac, and perigastric regions.  The cytology from the submucosal mass biopsy was nondiagnostic.  The stomach biopsy was positive for H. pylori..  No malignancy.  She was taken to a repeat EUS on 12/09/2019.  A subepithelial lesion was confirmed in the body of the stomach that appeared to originate from the muscularis propria.  The lesion measured 14.8 mm in thickness and 14.4 mm in diameter.  A fine-needle biopsy was performed.  No malignant appearing lymph nodes. A 10 mm polyp was removed the greater curvature. The  pathology from a stomach biopsy returned negative for H. pylori.  The stomach polyp returned as a hyperplastic polyp.  No malignancy.  The biopsy from the submucosal lesion returned as a spindle cell neoplasm with positive stain for CD34 and CD117 consistent with a gastrointestinal stromal tumor.  Ms. Mikrut reports feeling well.  She is referred for oncology evaluation.  She underwent a cardioversion procedure for atrial fibrillation yesterday. Past Medical History:  Diagnosis Date  . Arthritis    rheumatoid  . CAD (coronary artery disease)    a. 1992 s/p MI and PTCA of unknown vessel;  b. 09/2010 Cath: LM nl, LAD 20p, LCX 15m RCA 164mRPL 20.  . [place]  . Family history of anesthesia complication    daughter has difficulty waking   . GERD (gastroesophageal reflux disease)    occ  . Gout 10/08/2016  . Hyperlipidemia   . Hypertensive heart disease   . Hypothyroidism   . Nonischemic cardiomyopathy ([facility]   a. 07/2016 Echo: EF 40-45%, mild LVH, inferior akinesis, moderately dilated left atrium, trivial AI and MR.  . Marland KitchenAF (paroxysmal atrial fibrillation) (HCPeru   a. 07/2016 Admitted w/ AF RVR-->CHA2DS2VASc = 5-->Eliquis;  b. 07/2016 successful TEE/DCCV.  . Marland Kitchenleep apnea    a. Using CPAP.    . Marland KitchenG2, P2  Past Surgical History:  Procedure Laterality Date  . ABDOMINAL HYSTERECTOMY  1988  . BACK SURGERY  1994  . BIOPSY  09/02/2019   Procedure: BIOPSY;  Surgeon: MaRush Landmark  Telford Nab., MD;  Location: [facility];  Service: Gastroenterology;;  . BIOPSY  12/09/2019   Procedure: BIOPSY;  Surgeon: Irving Copas., MD;  Location: [facility];  Service: Gastroenterology;;  . [place]   PTCA BY DR Lamount Cohen  . CARDIOVERSION N/A 08/01/2016   Procedure: CARDIOVERSION;  Surgeon: Lelon Perla, MD;  Location: [facility];  Service: Cardiovascular;  Laterality: N/A;  . ENDOSCOPIC MUCOSAL RESECTION N/A 12/09/2019   Procedure: ENDOSCOPIC  MUCOSAL RESECTION;  Surgeon: Rush Landmark Telford Nab., MD;  Location: [facility];  Service: Gastroenterology;  Laterality: N/A;  . ESOPHAGOGASTRODUODENOSCOPY (EGD) WITH PROPOFOL N/A 09/02/2019   Procedure: ESOPHAGOGASTRODUODENOSCOPY (EGD) WITH PROPOFOL;  Surgeon: Rush Landmark Telford Nab., MD;  Location: [facility];  Service: Gastroenterology;  Laterality: N/A;  . ESOPHAGOGASTRODUODENOSCOPY (EGD) WITH PROPOFOL N/A 12/09/2019   Procedure: ESOPHAGOGASTRODUODENOSCOPY (EGD) WITH PROPOFOL;  Surgeon: Rush Landmark Telford Nab., MD;  Location: [facility];  Service: Gastroenterology;  Laterality: N/A;  . EUS N/A 09/02/2019   Procedure: UPPER ENDOSCOPIC ULTRASOUND (EUS) RADIAL;  Surgeon: Rush Landmark Telford Nab., MD;  Location: [facility];  Service: Gastroenterology;  Laterality: N/A;  EUS radial/linear  . EUS N/A 12/09/2019   Procedure: UPPER ENDOSCOPIC ULTRASOUND (EUS) RADIAL;  Surgeon: Irving Copas., MD;  Location: [facility];  Service: Gastroenterology;  Laterality: N/A;  . EUS  12/09/2019   Procedure: UPPER ENDOSCOPIC ULTRASOUND (EUS) LINEAR;  Surgeon: Irving Copas., MD;  Location: [facility];  Service: Gastroenterology;;  . FINE NEEDLE ASPIRATION  09/02/2019   Procedure: FINE NEEDLE ASPIRATION (FNA) LINEAR;  Surgeon: Irving Copas., MD;  Location: [facility];  Service: Gastroenterology;;  . FINE NEEDLE ASPIRATION  12/09/2019   Procedure: FINE NEEDLE ASPIRATION (FNA) LINEAR;  Surgeon: Irving Copas., MD;  Location: [facility];  Service: Gastroenterology;;  . HEMOSTASIS CLIP PLACEMENT  12/09/2019   Procedure: HEMOSTASIS CLIP PLACEMENT;  Surgeon: Irving Copas., MD;  Location: [facility];  Service: Gastroenterology;;  . ORIF ANKLE FRACTURE Right 04/27/2015   Procedure: OPEN REDUCTION INTERNAL FIXATION (ORIF) RIGHT BIMALLEOLAR ANKLE FRACTURE WITH SYNDESMOSIS FIXATION;  Surgeon: Leandrew Koyanagi, MD;  Location: [facility];  Service: Orthopedics;  Laterality:  Right;  . SUBMUCOSAL LIFTING INJECTION  12/09/2019   Procedure: SUBMUCOSAL LIFTING INJECTION;  Surgeon: Rush Landmark Telford Nab., MD;  Location: [facility];  Service: Gastroenterology;;  . TEE WITHOUT CARDIOVERSION N/A 08/01/2016   Procedure: TRANSESOPHAGEAL ECHOCARDIOGRAM (TEE);  Surgeon: Lelon Perla, MD;  [facility];  Service: Cardiovascular;  Laterality: N/A;  . THYROIDECTOMY  11/24/2013   DR Dalbert Batman  . THYROIDECTOMY N/A 11/24/2013   Procedure: TOTAL THYROIDECTOMY;  Surgeon: Adin Hector, MD;  Location: [place];  Service: General;  Laterality: N/A;  . TRANSTHORACIC ECHOCARDIOGRAM  01/15/2013   EF 55% TO 65%. PROBABLE MILD HYPOKINESIS OF THE INFERIOR MYOCARDIUM. GRADE 1 DIASTOLIC DYSFUNCTION. TRIAL AR.LA IS MILDLY DILATED.    Medications: Reviewed  Allergies:  Allergies  Allergen Reactions  . Labetalol     headaches  . Codeine Anxiety    Family history: Family history of cancer  Social History:   She lives alone in [place].  She is retired.  She worked as an Glass blower/designer.  She quit smoking cigarettes in 1993.  Rare alcohol use.  No risk factor for HIV or hepatitis.  ROS:   Positives include: Occasional discomfort in the joints of the hands  A complete ROS was otherwise negative.  Physical Exam:  Blood pressure (!) 108/49, pulse (!) 48, temperature 97.9 F (36.6 C),  temperature source Temporal, resp. rate 18, height '5\' 5"'  (1.651 m), weight 215 lb (97.5 kg), SpO2 97 %.  HEENT: Neck without mass Lungs: Coarse end inspiratory rhonchi at the left posterior base, no respiratory distress Cardiac: Regular rate and rhythm Abdomen: Nontender, no mass, no hepatosplenomegaly  Vascular: No leg edema Lymph nodes: No cervical, supraclavicular, axillary, or inguinal nodes Neurologic: Alert and oriented, motor exam appears intact in the upper and lower extremities bilaterally Skin: No rash Musculoskeletal: No spine tenderness   LAB:  CBC  Lab Results    Component Value Date   WBC 3.7 (L) 09/23/2019   HGB 15.6 (H) 01/11/2020   HCT 46.0 01/11/2020   MCV 89.1 09/23/2019   PLT 243 09/23/2019   NEUTROABS 1.8 01/14/2019        CMP  Lab Results  Component Value Date   NA 141 01/11/2020   K 4.1 01/11/2020   CL 105 01/11/2020   CO2 22 10/07/2019   GLUCOSE 92 01/11/2020   BUN 19 01/11/2020   CREATININE 1.10 (H) 01/11/2020   CALCIUM 9.6 10/07/2019   PROT 7.1 02/11/2019   ALBUMIN 4.4 02/11/2019   AST 28 02/11/2019   ALT 35 (H) 02/11/2019   ALKPHOS 105 02/11/2019   BILITOT 0.7 02/11/2019   GFRNONAA 67 10/07/2019   GFRAA 78 10/07/2019     Imaging:  CT images from 05/07/2019-reviewed   Assessment/Plan:   1. Gastrointestinal stromal tumor of the stomach  CT abdomen/pelvis 05/07/2019-enhancing soft tissue nodule, 1.4 cm, at the serosal surface of the distal gastric body  EUS 09/02/2019-subepithelial lesion at the greater curvature of the stomach originate from the muscularis layer 15 mm in thickness and 12 mm in diameter, FNA biopsy-nondiagnostic, no malignant lymph nodes  EUS 12/09/2019-subepithelial lesion at the body of the stomach, originating from muscularis propria, 14.8 mm in thickness, 14.4 mm in diameter, fine-needle biopsy-spindle cell neoplasm, CD34 and CD117 positive consistent with a gastrointestinal stromal 2.  H pylori on stomach biopsy 09/02/2019 3.  History of coronary artery disease, status post myocardial infarction 4.  History of atrial fibrillation, status post cardioversion 01/11/2020 5.  Rheumatoid arthritis 6.  Sleep apnea 7.  Cervical cancer 1977 8.  Hypothyroid   Disposition:   Ms. Heckart has been diagnosed with a gastrointestinal stromal tumor of the stomach.  There is no evidence of metastatic disease based on her history, physical examination, and the CT in May 2020.  The tumor was an incidental finding when she was undergoing a CT evaluation of a renal lesion.  She appears asymptomatic from a  gastrointestinal stromal tumor.  He has a good prognosis.  I discussed treatment of gastrointestinal stromal tumors with Ms. Lawrence.  The tumor is small.  Patients with a GIST of the stomach have a better prognosis than tumors arising in the small bowel.  I agree with Dr. Rush Landmark that observation with EUS follow-up is indicated.  I discussed a surgical referral with Ms. Trochez.  She is comfortable holding on a surgical referral for now.   She will plan to follow-up with Dr. Rush Landmark for a repeat EUS and CT at a 1 year interval.  I will see her back after the restaging evaluation.  Betsy Coder, MD  01/12/2020, 3:16 PM

## 2020-01-13 ENCOUNTER — Telehealth: Payer: Self-pay

## 2020-01-13 ENCOUNTER — Encounter: Payer: Self-pay | Admitting: *Deleted

## 2020-01-13 LAB — CYTOLOGY - NON PAP

## 2020-01-13 NOTE — Progress Notes (Signed)
Email to [facility] Pathology requesting mitotic rate and grade on case #[id number] dated 12/09/2019. Staff message sent to Judson Roch, new patient coordinator with [facility] to cancel referral to Dr. Barry Dienes per Dr. Benay Spice request.

## 2020-01-13 NOTE — Telephone Encounter (Signed)
Clinic recall in [place] for 3 months.

## 2020-01-13 NOTE — Telephone Encounter (Signed)
-----   Message from Irving Copas., MD sent at 01/13/2020  4:07 AM EST ----- Thanks [name]. Ronne Stefanski, let's see Ms. Lurz in clinic in the next couple of months to check and see how she is doing overall. I believe that we already have an EUS for 1-year from her prior (December 2021). We will get a staging CTAP in October/November to follow up the GIST and then proceed with EUS in December. Thanks. GM ----- Message ----- From: Ladell Pier, MD Sent: 01/12/2020   5:28 PM EST To: Irving Copas., MD  I saw her today, she is comfortable with observation  I agree with a 1 year EUS and CT abdomen  I will plan to see her back after the restaging evaluation  Thanks, Brad

## 2020-01-18 ENCOUNTER — Ambulatory Visit: Payer: PPO

## 2020-01-18 ENCOUNTER — Ambulatory Visit: Payer: PPO | Attending: Internal Medicine

## 2020-01-18 DIAGNOSIS — Z23 Encounter for immunization: Secondary | ICD-10-CM

## 2020-01-18 NOTE — Progress Notes (Signed)
   Covid-19 Vaccination Clinic  Name:  DRAVEN MERANTE    MRN: [id number] DOB: 1946-04-07  01/18/2020  Ms. Calzadilla was observed post Covid-19 immunization for 15 minutes without incidence. She was provided with Vaccine Information Sheet and instruction to access the V-Safe system.   Ms. Roubideaux was instructed to call 911 with any severe reactions post vaccine: Marland Kitchen Difficulty breathing  . Swelling of your face and throat  . A fast heartbeat  . A bad rash all over your body  . Dizziness and weakness    Immunizations Administered    Name Date Dose VIS Date Route   Pfizer COVID-19 Vaccine 01/18/2020  8:30 AM 0.3 mL 11/20/2019 Intramuscular   Manufacturer: [place]   Lot: [id number]   [place]: [id number]

## 2020-01-21 ENCOUNTER — Encounter: Payer: Self-pay | Admitting: Family Medicine

## 2020-01-21 ENCOUNTER — Encounter: Payer: PPO | Admitting: Family Medicine

## 2020-01-21 ENCOUNTER — Other Ambulatory Visit: Payer: Self-pay

## 2020-01-21 ENCOUNTER — Ambulatory Visit (INDEPENDENT_AMBULATORY_CARE_PROVIDER_SITE_OTHER): Payer: PPO | Admitting: Family Medicine

## 2020-01-21 VITALS — BP 120/70 | HR 57 | Temp 97.1°F | Wt 213.2 lb

## 2020-01-21 DIAGNOSIS — I10 Essential (primary) hypertension: Secondary | ICD-10-CM

## 2020-01-21 DIAGNOSIS — G4733 Obstructive sleep apnea (adult) (pediatric): Secondary | ICD-10-CM

## 2020-01-21 DIAGNOSIS — Z9989 Dependence on other enabling machines and devices: Secondary | ICD-10-CM | POA: Diagnosis not present

## 2020-01-21 DIAGNOSIS — Z7901 Long term (current) use of anticoagulants: Secondary | ICD-10-CM

## 2020-01-21 DIAGNOSIS — E890 Postprocedural hypothyroidism: Secondary | ICD-10-CM

## 2020-01-21 NOTE — Progress Notes (Signed)
   Subjective:    Patient ID: Claudia Cantrell, female    DOB: 07-Sep-1946, 74 y.o.   MRN: [id number]  HPI Chief Complaint  Patient presents with  . med check    med check, wants thyroid checked   She is here today for medication management visit.  She is taking levothyroxine for hypothyroidism and has not had her thyroid function checked recently.  States she feels the best she has felt in a long time. No new concerns today.  States her blood pressure has been good and her cardiologist has been managing her medications. States she is using her CPAP nightly and doing well with it. Reports recently seeing her nephrologist, Dr. Carolin Sicks.  Other providers: Dr. Claiborne Billings- cardiologist  Dr. Carolin Sicks- nephrologist   Denies fever, chills, dizziness, headache, chest pain, palpitations, shortness of breath, abdominal pain, nausea, vomiting, diarrhea, lower extremity edema.   Review of Systems Pertinent positives and negatives in the history of present illness.     Objective:   Physical Exam BP 120/70   Pulse (!) 57   Temp (!) 97.1 F (36.2 C)   Wt 213 lb 3.2 oz (96.7 kg)   SpO2 98%   BMI 35.48 kg/m   Alert and in no distress. Cardiac exam shows a regular rate and rhythm. Lungs are clear to auscultation.       Assessment & Plan:  Postoperative hypothyroidism - Plan: TSH, T4, free  Essential hypertension, benign  OSA on CPAP  Chronic anticoagulation  She is here to have her thyroid function checked and for refills of her thyroid medication. Reports being in her usual state of health and feeling quite good.  She is in good spirits.  She is followed closely by cardiology. Continue all current medications and we will adjust levothyroxine as appropriate.

## 2020-01-22 ENCOUNTER — Other Ambulatory Visit: Payer: Self-pay | Admitting: Internal Medicine

## 2020-01-22 LAB — T4, FREE: Free T4: 1.74 ng/dL (ref 0.82–1.77)

## 2020-01-22 LAB — TSH: TSH: 0.591 u[IU]/mL (ref 0.450–4.500)

## 2020-01-22 MED ORDER — SYNTHROID 112 MCG PO TABS: 112.0000 ug | ORAL_TABLET | Freq: Every day | ORAL | 0 refills | Status: DC

## 2020-01-22 NOTE — Progress Notes (Signed)
Please let her know that her thyroid function is in normal range. Ok to refill her medication at the same dose.

## 2020-01-27 ENCOUNTER — Other Ambulatory Visit: Payer: Self-pay | Admitting: Cardiovascular Disease

## 2020-01-27 ENCOUNTER — Encounter: Payer: Self-pay | Admitting: Family Medicine

## 2020-02-01 ENCOUNTER — Ambulatory Visit: Payer: PPO | Admitting: Cardiology

## 2020-02-01 ENCOUNTER — Encounter: Payer: Self-pay | Admitting: Cardiology

## 2020-02-01 ENCOUNTER — Other Ambulatory Visit: Payer: Self-pay

## 2020-02-01 VITALS — BP 134/71 | HR 50 | Ht 65.0 in | Wt 212.0 lb

## 2020-02-01 DIAGNOSIS — I2510 Atherosclerotic heart disease of native coronary artery without angina pectoris: Secondary | ICD-10-CM | POA: Diagnosis not present

## 2020-02-01 DIAGNOSIS — Z9861 Coronary angioplasty status: Secondary | ICD-10-CM | POA: Diagnosis not present

## 2020-02-01 DIAGNOSIS — I5041 Acute combined systolic (congestive) and diastolic (congestive) heart failure: Secondary | ICD-10-CM

## 2020-02-01 DIAGNOSIS — I480 Paroxysmal atrial fibrillation: Secondary | ICD-10-CM

## 2020-02-01 DIAGNOSIS — I4819 Other persistent atrial fibrillation: Secondary | ICD-10-CM

## 2020-02-01 MED ORDER — CARVEDILOL 25 MG PO TABS: ORAL_TABLET | ORAL | 3 refills | Status: DC

## 2020-02-01 NOTE — Patient Instructions (Signed)
Medication Instructions:  Change Carvedilol--take 1 tablet (25 mg) in the morning and 1/2 tablet (12.5 mg) in the evening.  *If you need a refill on your cardiac medications before your next appointment, please call your pharmacy*   Follow-Up: At [facility], you and your health needs are our priority.  As part of our continuing mission to provide you with exceptional heart care, we have created designated Provider Care Teams.  These Care Teams include your primary Cardiologist (physician) and Advanced Practice Providers (APPs -  Physician Assistants and Nurse Practitioners) who all work together to provide you with the care you need, when you need it.  Your next appointment:   3 month(s)  The format for your next appointment:   In Person  Provider:   Kerin Ransom, [place]  Other Instructions Please check your heart rate 3 times weekly when you check your blood pressure. If your heart rate is consistently below 50, please call our office to let us know.

## 2020-02-01 NOTE — Progress Notes (Signed)
Cardiology Office Note:    Date:  02/01/2020   ID:  Claudia Cantrell, DOB 03-29-1946, MRN [id number]  PCP:  Girtha Rm, NP-C  Cardiologist:  Shelva Majestic, MD  Electrophysiologist:  None   Referring MD: Girtha Rm, NP-C   No chief complaint on file. F/U after OP DCCV  History of Present Illness:    Claudia Cantrell is a 74 y.o.pleasant AA female with a hx of of CAD, S/P remote PCI in the 90's, OSA-on C-pap, HTN, and PAF s/p DCCV Aug 2017.  She is on Eliquis. The patient had an endoscopy 09/02/2019. She was going to have a polyp biopsied but the procedure was aborted secondary to SOB and HTN. She was to follow up with Dr Claudia Cantrell but presented to the ED first on 09/21/2019 with chest pain, CHF, HTN, and AF. She ruled out for an MI. She was diuresed and her medications were adjusted for HTN and AF. Myoview showed scar with some superimposed ischemia. Echo showed an EF of 45-50% with moderate LVH and moderately dilated LA.  It was decided not to proceed with coronary angiogram at that time.  The patient was discharged 09/23/2019.  At discharge it was decided to have the patient complete her GI work up then resume anticoagulation and plan on DCCV 4 weeks after Eliquis resumed.  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  She eventually underwent OP DCCV 01/11/2020.  She returns today for follow up.  Since her DCCV she feels like she has more energy. Overall she does note an improvement.  She is concerned about her weight and we discussed that at length.  Her EKG today shows NSR, SB 48.  She denies any syncope or near syncope.   Past Medical History:  Diagnosis Date  . Arthritis    rheumatoid  . CAD (coronary artery disease)    a. 1992 s/p MI and PTCA of unknown vessel;  b. 09/2010 Cath: LM nl, LAD 20p, LCX 64m, RCA 53m, RPL 20.  Marland Kitchen Cervical cancer ([place]) 1977  . Family history of anesthesia complication    daughter has difficulty waking   . GERD  (gastroesophageal reflux disease)    occ  . Gout 10/08/2016  . Hyperlipidemia   . Hypertensive heart disease   . Hypothyroidism   . Nonischemic cardiomyopathy ([place])    a. 07/2016 Echo: EF 40-45%, mild LVH, inferior akinesis, moderately dilated left atrium, trivial AI and MR.  Marland Kitchen PAF (paroxysmal atrial fibrillation) ([place])    a. 07/2016 Admitted w/ AF RVR-->CHA2DS2VASc = 5-->Eliquis;  b. 07/2016 successful TEE/DCCV.  Marland Kitchen Sleep apnea    a. Using CPAP.    Past Surgical History:  Procedure Laterality Date  . ABDOMINAL HYSTERECTOMY  1988  . BACK SURGERY  1994  . BIOPSY  09/02/2019   Procedure: BIOPSY;  Surgeon: Rush Landmark Telford Nab., MD;  Location: [facility];  Service: Gastroenterology;;  . BIOPSY  12/09/2019   Procedure: BIOPSY;  Surgeon: Irving Copas., MD;  Location: [facility];  Service: Gastroenterology;;  . [place]   PTCA BY DR Lamount Cohen  . CARDIOVERSION N/A 08/01/2016   Procedure: CARDIOVERSION;  Surgeon: Lelon Perla, MD;  Location: [facility] END[facility]COPY;  Service: Cardiovascular;  Laterality: N/A;  . CARDIOVERSION N/A 01/11/2020   Procedure: CARDIOVERSION;  Surgeon: Josue Hector, MD;  Location: [place];  Service: Cardiovascular;  Laterality: N/A;  . ENDOSCOPIC MUCOSAL RESECTION N/A 12/09/2019  Procedure: ENDOSCOPIC MUCOSAL RESECTION;  Surgeon: Rush Landmark Telford Nab., MD;  Location: [facility];  Service: Gastroenterology;  Laterality: N/A;  . ESOPHAGOGASTRODUODENOSCOPY (EGD) WITH PROPOFOL N/A 09/02/2019   Procedure: ESOPHAGOGASTRODUODENOSCOPY (EGD) WITH PROPOFOL;  Surgeon: Rush Landmark Telford Nab., MD;  [facility];  Service: Gastroenterology;  Laterality: N/A;  . ESOPHAGOGASTRODUODENOSCOPY (EGD) WITH PROPOFOL N/A 12/09/2019   Procedure: ESOPHAGOGASTRODUODENOSCOPY (EGD) WITH PROPOFOL;  Surgeon: Rush Landmark Telford Nab., MD;  [facility];  Service: Gastroenterology;  Laterality: N/A;  . EUS N/A 09/02/2019    Procedure: UPPER ENDOSCOPIC ULTRASOUND (EUS) RADIAL;  Surgeon: Rush Landmark Telford Nab., MD;  [facility];  Service: Gastroenterology;  Laterality: N/A;  EUS radial/linear  . EUS N/A 12/09/2019   Procedure: UPPER ENDOSCOPIC ULTRASOUND (EUS) RADIAL;  Surgeon: Irving Copas., MD;  [facility];  Service: Gastroenterology;  Laterality: N/A;  . EUS  12/09/2019   Procedure: UPPER ENDOSCOPIC ULTRASOUND (EUS) LINEAR;  Surgeon: Irving Copas., MD;  [facility];  Service: Gastroenterology;;  . FINE NEEDLE ASPIRATION  09/02/2019   Procedure: FINE NEEDLE ASPIRATION (FNA) LINEAR;  Surgeon: Irving Copas., MD;  [facility];  Service: Gastroenterology;;  . FINE NEEDLE ASPIRATION  12/09/2019   Procedure: FINE NEEDLE ASPIRATION (FNA) LINEAR;  Surgeon: Irving Copas., MD;  Location: [facility];  Service: Gastroenterology;;  . HEMOSTASIS CLIP PLACEMENT  12/09/2019   Procedure: HEMOSTASIS CLIP PLACEMENT;  Surgeon: Irving Copas., MD;  [facility];  Service: Gastroenterology;;  . ORIF ANKLE FRACTURE Right 04/27/2015   Procedure: OPEN REDUCTION INTERNAL FIXATION (ORIF) RIGHT BIMALLEOLAR ANKLE FRACTURE WITH SYNDESMOSIS FIXATION;  Surgeon: Leandrew Koyanagi, MD;  Location: [place];  Service: Orthopedics;  Laterality: Right;  . SUBMUCOSAL LIFTING INJECTION  12/09/2019   Procedure: SUBMUCOSAL LIFTING INJECTION;  Surgeon: Rush Landmark Telford Nab., MD;  [facility];  Service: Gastroenterology;;  . TEE WITHOUT CARDIOVERSION N/A 08/01/2016   Procedure: TRANSESOPHAGEAL ECHOCARDIOGRAM (TEE);  Surgeon: Lelon Perla, MD;  Location: [facility] ENDOSCOPY;  Service: Cardiovascular;  Laterality: N/A;  . THYROIDECTOMY  11/24/2013   DR Dalbert Batman  . THYROIDECTOMY N/A 11/24/2013   Procedure: TOTAL THYROIDECTOMY;  Surgeon: Adin Hector, MD;  Location: [place];  Service: General;  Laterality: N/A;  . TRANSTHORACIC ECHOCARDIOGRAM  01/15/2013    EF 55% TO 65%. PROBABLE MILD HYPOKINESIS OF THE INFERIOR MYOCARDIUM. GRADE 1 DIASTOLIC DYSFUNCTION. TRIAL AR.LA IS MILDLY DILATED.    Current Medications: Current Meds  Medication Sig  . acetaminophen (TYLENOL) 650 MG CR tablet Take 650 mg by mouth every 8 (eight) hours as needed for pain.   Marland Kitchen allopurinol (ZYLOPRIM) 100 MG tablet Take 100 mg by mouth daily.  Marland Kitchen amLODipine (NORVASC) 10 MG tablet Take 10 mg by mouth daily at 3 pm.   . apixaban (ELIQUIS) 5 MG TABS tablet Take 1 tablet (5 mg total) by mouth 2 (two) times daily.  . Biotin w/ Vitamins C & E (HAIR/SKIN/NAILS PO) Take 2,500 mg by mouth daily.  . carvedilol (COREG) 25 MG tablet Take 1 tablet (25 mg total) by mouth 2 (two) times daily with a meal.  . Cholecalciferol (DIALYVITE VITAMIN D 5000) 125 MCG (5000 UT) capsule Take 5,000 Units by mouth daily.  . hydrALAZINE (APRESOLINE) 10 MG tablet Take 1 tablet (10 mg total) by mouth 3 (three) times daily.  . hydrochlorothiazide (MICROZIDE) 12.5 MG capsule Take 1 capsule (12.5 mg total) by mouth daily.  . isosorbide dinitrate (ISORDIL) 5 MG tablet Take 1 tablet (5 mg total) by mouth 3 (three)  times daily.  . Magnesium 400 MG CAPS Take 400 mg by mouth daily.  Marland Kitchen olmesartan (BENICAR) 40 MG tablet Take 40 mg by mouth daily.   Marland Kitchen omeprazole (PRILOSEC) 40 MG capsule Take 1 capsule (40 mg total) by mouth 2 (two) times daily before a meal. Take 30 minutes before breakfast and dinner for 1 month.  Then may decrease to 40 mg once daily before breakfast or before dinner and maintain. (Patient taking differently: Take 40 mg by mouth 2 (two) times daily before a meal. )  . Probiotic Product (PROBIOTIC DAILY PO) Take 1 tablet by mouth daily.  . rosuvastatin (CRESTOR) 40 MG tablet Take 1 tablet by mouth once daily (Patient taking differently: Take 40 mg by mouth daily. )  . SYNTHROID 112 MCG tablet Take 1 tablet (112 mcg total) by mouth daily.     Allergies:   Labetalol and Codeine   Social History    Socioeconomic History  . Marital status: Widowed    Spouse name: Not on file  . Number of children: 3  . Years of education: Not on file  . Highest education level: Not on file  Occupational History  . Occupation: retired  Tobacco Use  . Smoking status: Former Smoker    Packs/day: 1.00    Years: 15.00    Pack years: 15.00    Types: Cigarettes    Quit date: 12/10/1990    Years since quitting: 29.1  . Smokeless tobacco: Never Used  . Tobacco comment: occ alcohol  Substance and Sexual Activity  . Alcohol use: Yes    Comment: very rare  . Drug use: No  . Sexual activity: Not on file  Other Topics Concern  . Not on file  Social History Narrative  . Not on file   Social Determinants of Health   Financial Resource Strain:   . Difficulty of Paying Living Expenses: Not on file  Food Insecurity:   . Worried About Charity fundraiser in the Last Year: Not on file  . Ran Out of Food in the Last Year: Not on file  Transportation Needs:   . Lack of Transportation (Medical): Not on file  . Lack of Transportation (Non-Medical): Not on file  Physical Activity:   . Days of Exercise per Week: Not on file  . Minutes of Exercise per Session: Not on file  Stress:   . Feeling of Stress : Not on file  Social Connections:   . Frequency of Communication with Friends and Family: Not on file  . Frequency of Social Gatherings with Friends and Family: Not on file  . Attends Religious Services: Not on file  . Active Member of Clubs or Organizations: Not on file  . Attends Archivist Meetings: Not on file  . Marital Status: Not on file     Family History: The patient's family history includes Bone cancer in her sister; Diabetes in her father; Heart disease in her brother and mother; Melanoma in her brother; Stroke in her father; Sudden death in her brother and mother. There is no history of Colon cancer, Esophageal cancer, Inflammatory bowel disease, Liver disease, Pancreatic cancer,  Rectal cancer, or Stomach cancer.  ROS:   Please see the history of present illness.    LE edema improved with the addition of HCTZ  All other systems reviewed and are negative.  EKGs/Labs/Other Studies Reviewed:    The following studies were reviewed today: Echo Oct 2020  EKG:  EKG is ordered today.  The ekg ordered today demonstrates NSR-SB-48, low volts, poor anterior RW (obesity)  Recent Labs: 02/11/2019: ALT 35 09/21/2019: B Natriuretic Peptide 858.8 09/23/2019: Magnesium 2.1; Platelets 243 01/11/2020: BUN 19; Creatinine, Ser 1.10; Hemoglobin 15.6; Potassium 4.1; Sodium 141 01/21/2020: TSH 0.591  Recent Lipid Panel    Component Value Date/Time   CHOL 114 09/22/2019 0226   CHOL 148 01/14/2019 1045   TRIG 94 09/22/2019 0226   HDL 34 (L) 09/22/2019 0226   HDL 50 01/14/2019 1045   CHOLHDL 3.4 09/22/2019 0226   VLDL 19 09/22/2019 0226   LDLCALC 61 09/22/2019 0226   LDLCALC 78 01/14/2019 1045    Physical Exam:    VS:  BP 134/71   Pulse (!) 50   Ht 5\' 5"  (1.651 m)   Wt 212 lb (96.2 kg)   SpO2 98%   BMI 35.28 kg/m     Wt Readings from Last 3 Encounters:  02/01/20 212 lb (96.2 kg)  01/21/20 213 lb 3.2 oz (96.7 kg)  01/12/20 215 lb (97.5 kg)     GEN: Overweight AA female,  well developed in no acute distress HEENT: Normal NECK: No JVD; No carotid bruits CARDIAC: RRR, no murmurs, rubs, gallops RESPIRATORY:  Clear to auscultation without rales, wheezing or rhonchi  ABDOMEN: Soft, non-tender, non-distended MUSCULOSKELETAL:  No edema; No deformity  SKIN: Warm and dry NEUROLOGIC:  Alert and oriented x 3 PSYCHIATRIC:  Normal affect   ASSESSMENT:    PAF- In NSR after OP DCCV 01/12/2020 with improvement in her symptoms of fatigue.  She is a little bradycardic.  Chronic combined systolic and diastolic CHF, NYHA class 3 (HCC) Admitted 09/21/2019-currently stable  CAD S/P percutaneous coronary angioplasty 1992 s/p MI and PTCA of unknown vessel (Dr Sherald Barge)   09/2010 Cath: LM nl, LAD 20p, LCX 35m, RCA 4m, RPL 20. Myoview 09/22/2019- scar with peri-infarct ischemia- medical rx  Nonischemic cardiomyopathy [facility]) 2D Oct 2020- EF 45-50% with moderate LVH  Hypertensive heart disease Echo Oct 2020 shows EF 45-50% with moderate LVH, moderate LAE. B/P controlled  OSA on CPAP She reports compliance with C-pap  Chronic anticoagulation- Eliquis resumed 12/12/2019  Stromal tumor- Incidental finding- Dr Learta Codding following.  Overweight- BMI 35- weight loss diet and exercise discussed.   PLAN:    Decrease coreg to 25 mg in am, 12.5 mg in PM.  Monitor HR as well as B/P as an OP.  F/U 3 months.    Medication Adjustments/Labs and Tests Ordered: Current medicines are reviewed at length with the patient today.  Concerns regarding medicines are outlined above.  No orders of the defined types were placed in this encounter.  No orders of the defined types were placed in this encounter.   There are no Patient Instructions on file for this visit.   Signed, Kerin Ransom, PA-C  02/01/2020 2:05 PM    [facility]

## 2020-02-04 DIAGNOSIS — Z1231 Encounter for screening mammogram for malignant neoplasm of breast: Secondary | ICD-10-CM | POA: Diagnosis not present

## 2020-02-04 LAB — HM MAMMOGRAPHY

## 2020-02-05 ENCOUNTER — Encounter: Payer: Self-pay | Admitting: Internal Medicine

## 2020-03-16 LAB — HM DIABETES EYE EXAM

## 2020-03-31 IMAGING — DX DG CHEST 2V
2 series · 2 of 2 positions shown · non-contrast
Comparison: Chest radiograph dated 07/29/2016

CLINICAL DATA: Chest pain

EXAM:
CHEST - 2 VIEW

[w chest pa]
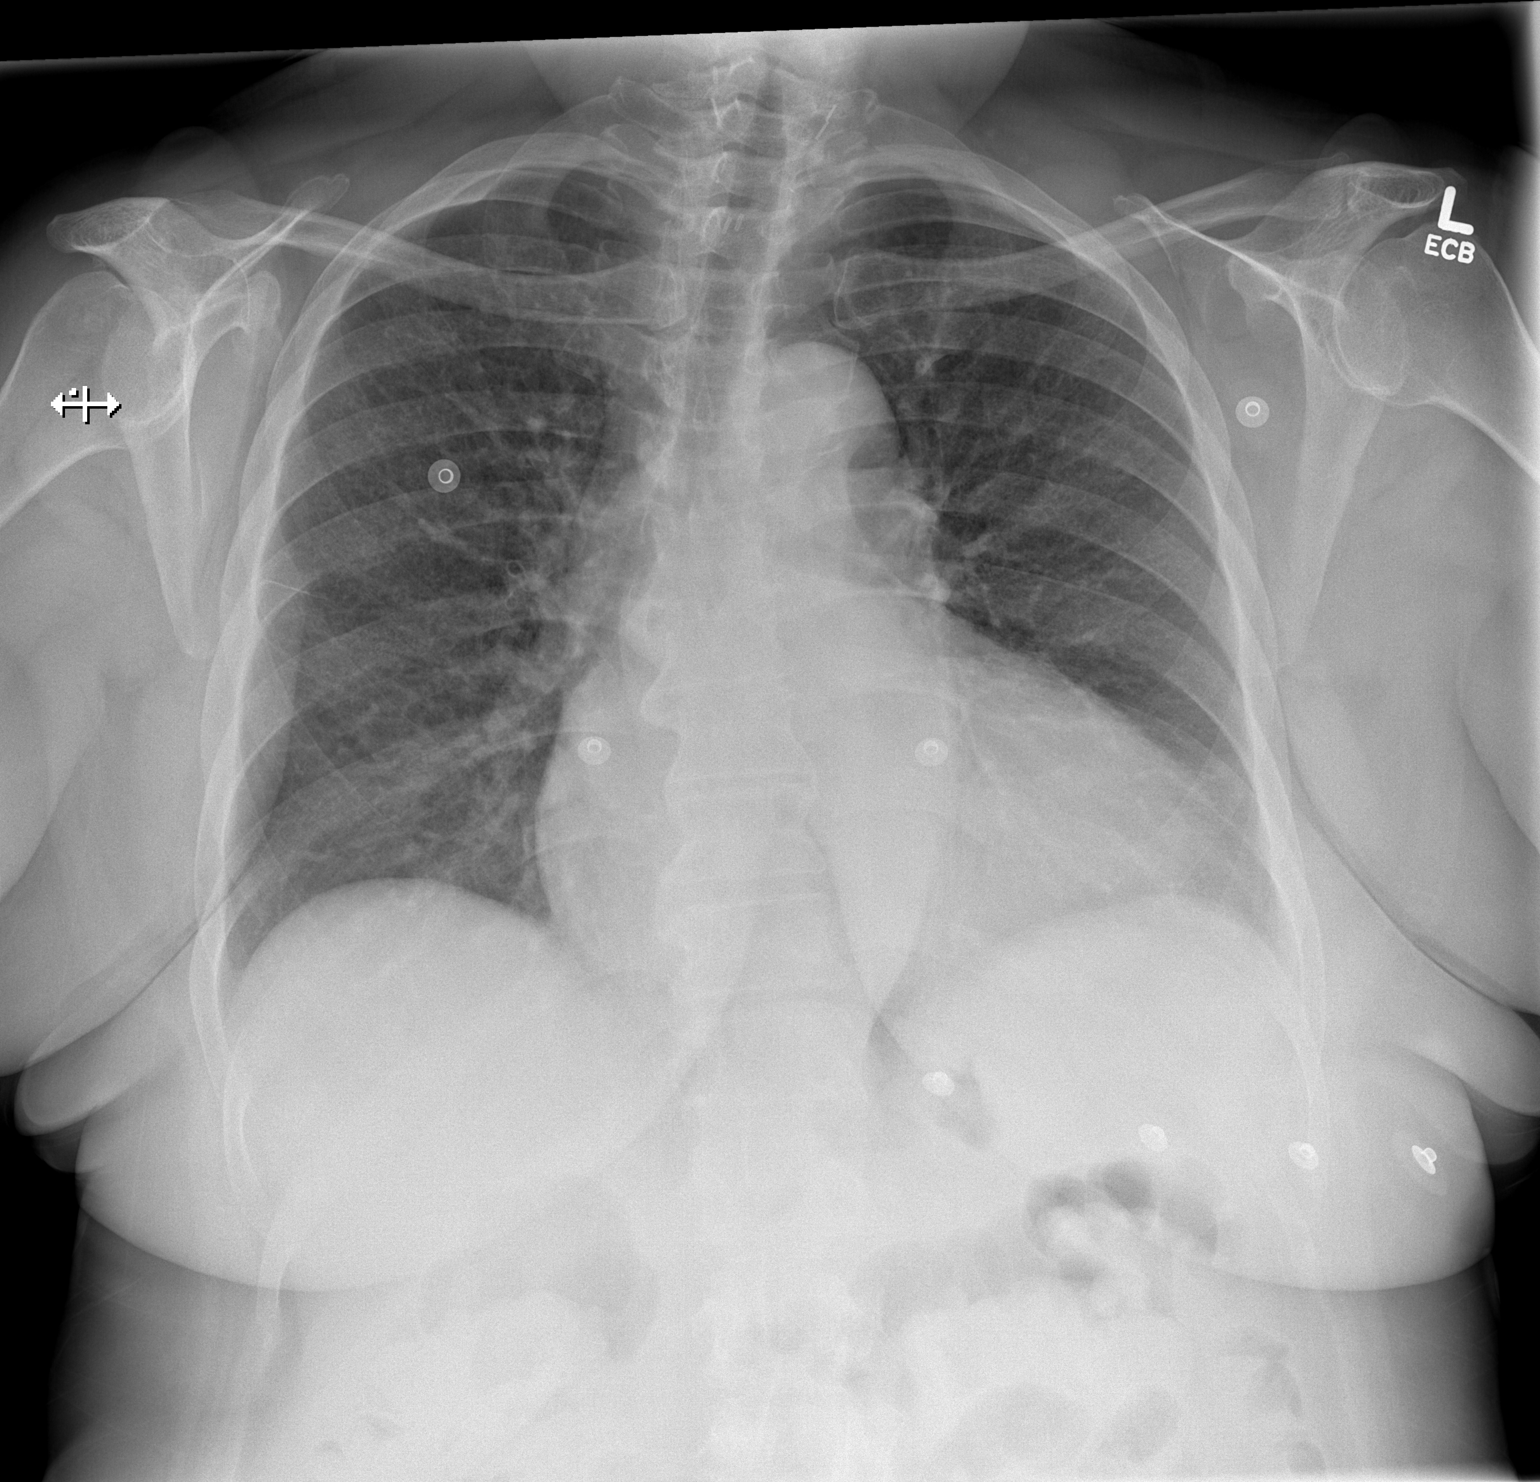

[w chest lat]
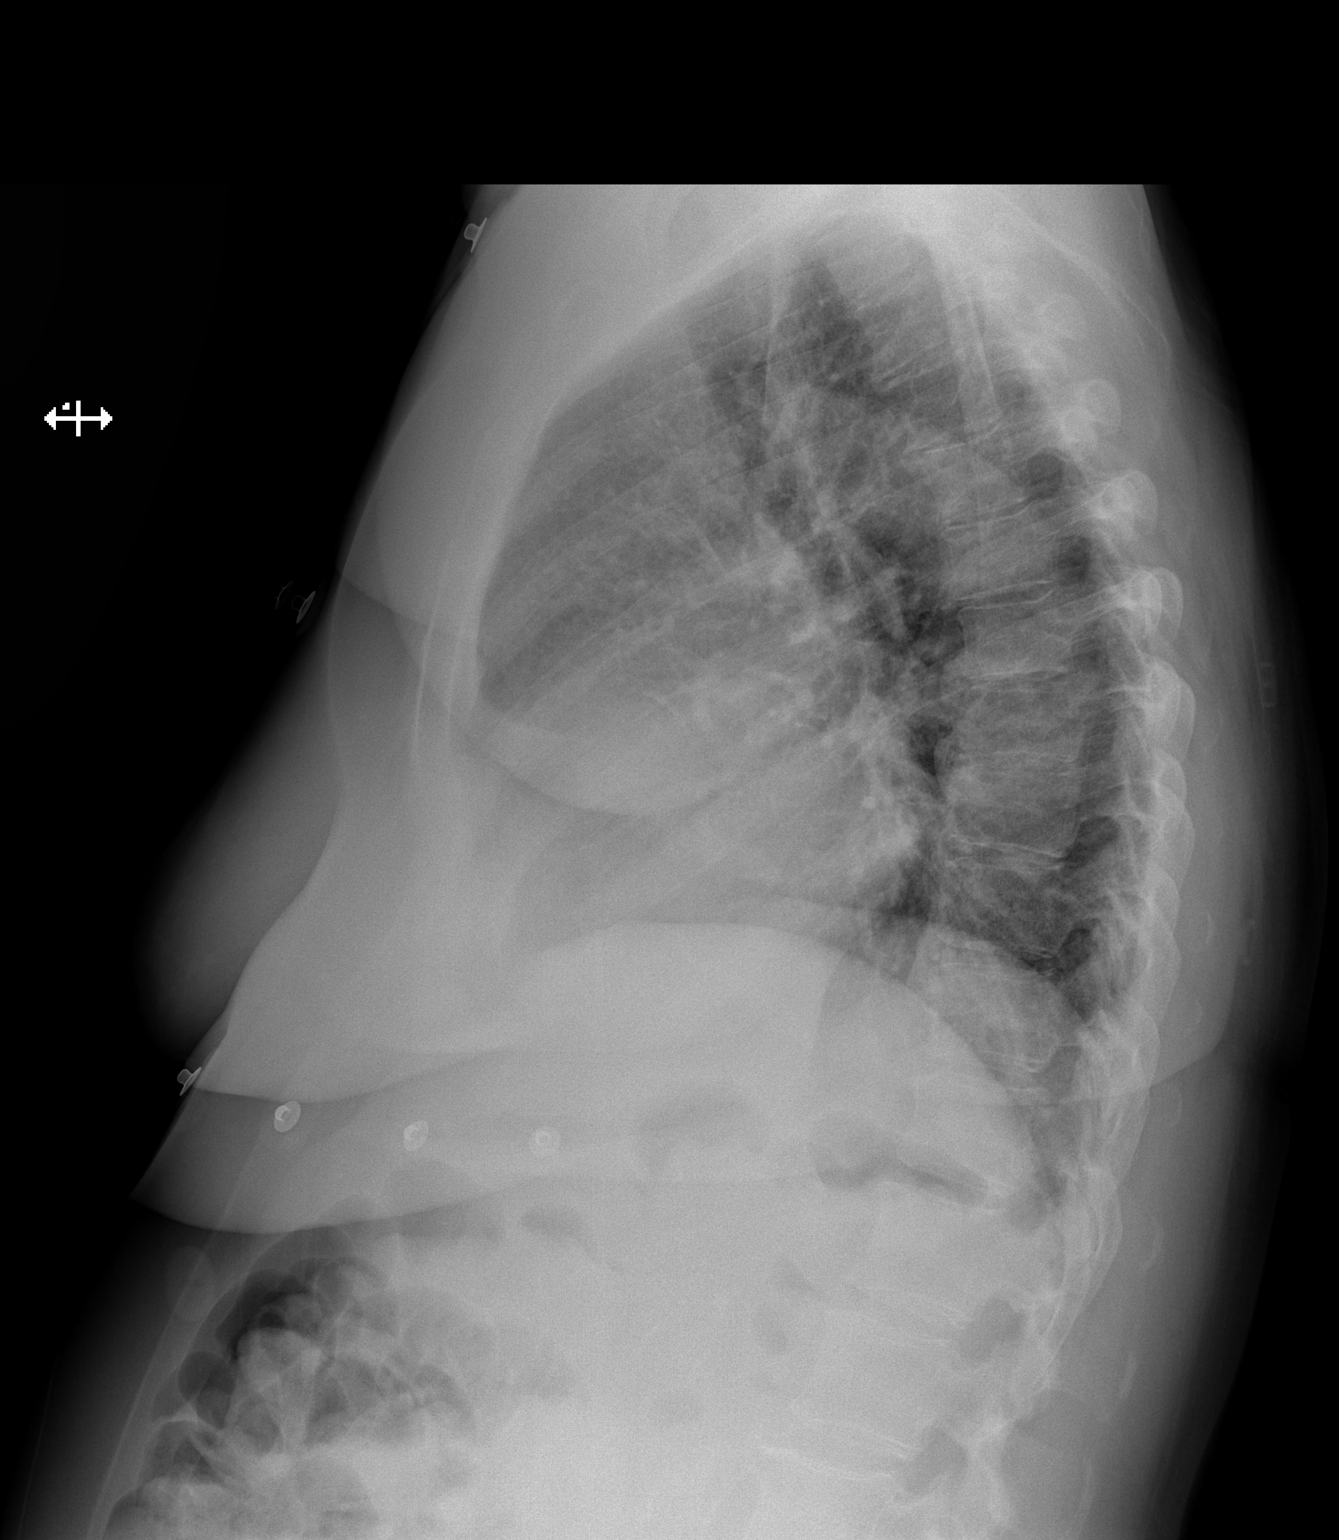

[2 of 2 positions shown; findings below may reference images not displayed]

FINDINGS: The heart remains enlarged. The lungs are clear. Degenerative
changes are seen in the spine.
IMPRESSION: Cardiomegaly.  Clear lungs.

## 2020-04-13 NOTE — Progress Notes (Deleted)
Office Visit Note  Patient: Claudia Cantrell             Date of Birth: 12-09-1946           MRN: [id number]             PCP: Girtha Rm, NP-C Referring: Girtha Rm, NP-C Visit Date: 04/14/2020 Occupation: @GUAROCC @  Subjective:  No chief complaint on file.   History of Present Illness: Claudia Cantrell is a 74 y.o. female ***   Activities of Daily Living:  Patient reports morning stiffness for *** {minute/hour:19697}.   Patient {ACTIONS;DENIES/REPORTS:21021675::"Denies"} nocturnal pain.  Difficulty dressing/grooming: {ACTIONS;DENIES/REPORTS:21021675::"Denies"} Difficulty climbing stairs: {ACTIONS;DENIES/REPORTS:21021675::"Denies"} Difficulty getting out of chair: {ACTIONS;DENIES/REPORTS:21021675::"Denies"} Difficulty using hands for taps, buttons, cutlery, and/or writing: {ACTIONS;DENIES/REPORTS:21021675::"Denies"}  No Rheumatology ROS completed.   PMFS History:  Patient Active Problem List   Diagnosis Date Noted  . Acute combined systolic and diastolic CHF, NYHA class 3 ([place]) 09/23/2019  . Persistent atrial fibrillation ([place]) 09/21/2019  . Submucosal lesion of stomach 07/19/2019  . Abnormal CT of the abdomen 07/19/2019  . Abnormal CT scan, stomach 06/17/2019  . Advance directive discussed with patient 01/14/2019  . Chronic anticoagulation 01/14/2019  . CRI (chronic renal insufficiency), stage 2 (mild) 01/14/2019  . Prediabetes 01/14/2019  . Persistent proteinuria 02/17/2018  . 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 Vitamin D deficiency 03/26/2017  . Medication monitoring encounter 03/26/2017  . Gout 10/08/2016  . PAF (paroxysmal atrial fibrillation) ([place])   . Hypertensive heart disease   . Hyperlipidemia   . CAD S/P percutaneous coronary angioplasty   . Nonischemic cardiomyopathy ([place])   . Chest pain with moderate risk for cardiac etiology 08/02/2016  . Closed right ankle fracture 04/24/2015  . Ankle fracture,  bimalleolar, closed 04/24/2015  . 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 OSA on CPAP 10/09/2013    Past Medical History:  Diagnosis Date  . Arthritis    rheumatoid  . CAD (coronary artery disease)    a. 1992 s/p MI and PTCA of unknown vessel;  b. 09/2010 Cath: LM nl, LAD 20p, LCX 35m, RCA 24m, RPL 20.  Marland Kitchen Cervical cancer ([place]) 1977  . Family history of anesthesia complication    daughter has difficulty waking   . GERD (gastroesophageal reflux disease)    occ  . Gout 10/08/2016  . Hyperlipidemia   . Hypertensive heart disease   . Hypothyroidism   . Nonischemic cardiomyopathy ([place])    a. 07/2016 Echo: EF 40-45%, mild LVH, inferior akinesis, moderately dilated left atrium, trivial AI and MR.  Marland Kitchen PAF (paroxysmal atrial fibrillation) ([place])    a. 07/2016 Admitted w/ AF RVR-->CHA2DS2VASc = 5-->Eliquis;  b. 07/2016 successful TEE/DCCV.  Marland Kitchen Sleep apnea    a. Using CPAP.    Family History  Problem Relation Age of Onset  . Heart disease Mother   . Sudden death Mother   . Diabetes Father   . Stroke Father   . Bone cancer Sister   . Sudden death Brother   . Melanoma Brother   . Heart disease Brother   . Colon cancer Neg Hx   . Esophageal cancer Neg Hx   . Inflammatory bowel disease Neg Hx   . Liver disease Neg Hx   . Pancreatic cancer Neg Hx   . Rectal cancer Neg Hx   . Stomach cancer Neg Hx    Past Surgical History:  Procedure Laterality Date  . ABDOMINAL HYSTERECTOMY  1988  . BACK  SURGERY  1994  . BIOPSY  09/02/2019   Procedure: BIOPSY;  Surgeon: Rush Landmark Telford Nab., MD;  Location: [facility];  Service: Gastroenterology;;  . BIOPSY  12/09/2019   Procedure: BIOPSY;  Surgeon: Irving Copas., MD;  Location: [facility];  Service: Gastroenterology;;  . [place]   PTCA BY DR Lamount Cohen  . CARDIOVERSION N/A 08/01/2016   Procedure: CARDIOVERSION;  Surgeon: Lelon Perla, MD;  Location[facility] END[facility]COPY;  Service: Cardiovascular;   Laterality: N/A;  . CARDIOVERSION N/A 01/11/2020   Procedure: CARDIOVERSION;  Surgeon: Josue Hector, MD;  Location: [place];  Service: Cardiovascular;  Laterality: N/A;  . ENDOSCOPIC MUCOSAL RESECTION N/A 12/09/2019   Procedure: ENDOSCOPIC MUCOSAL RESECTION;  Surgeon: Rush Landmark Telford Nab., MD;  [facility];  Service: Gastroenterology;  Laterality: N/A;  . ESOPHAGOGASTRODUODENOSCOPY (EGD) WITH PROPOFOL N/A 09/02/2019   Procedure: ESOPHAGOGASTRODUODENOSCOPY (EGD) WITH PROPOFOL;  Surgeon: Rush Landmark Telford Nab., MD;  [facility];  Service: Gastroenterology;  Laterality: N/A;  . ESOPHAGOGASTRODUODENOSCOPY (EGD) WITH PROPOFOL N/A 12/09/2019   Procedure: ESOPHAGOGASTRODUODENOSCOPY (EGD) WITH PROPOFOL;  Surgeon: Rush Landmark Telford Nab., MD;  [facility];  Service: Gastroenterology;  Laterality: N/A;  . EUS N/A 09/02/2019   Procedure: UPPER ENDOSCOPIC ULTRASOUND (EUS) RADIAL;  Surgeon: Rush Landmark Telford Nab., MD;  [facility];  Service: Gastroenterology;  Laterality: N/A;  EUS radial/linear  . EUS N/A 12/09/2019   Procedure: UPPER ENDOSCOPIC ULTRASOUND (EUS) RADIAL;  Surgeon: Irving Copas., MD;  [facility];  Service: Gastroenterology;  Laterality: N/A;  . EUS  12/09/2019   Procedure: UPPER ENDOSCOPIC ULTRASOUND (EUS) LINEAR;  Surgeon: Irving Copas., MD;  [facility];  Service: Gastroenterology;;  . FINE NEEDLE ASPIRATION  09/02/2019   Procedure: FINE NEEDLE ASPIRATION (FNA) LINEAR;  Surgeon: Irving Copas., MD;  [facility];  Service: Gastroenterology;;  . FINE NEEDLE ASPIRATION  12/09/2019   Procedure: FINE NEEDLE ASPIRATION (FNA) LINEAR;  Surgeon: Irving Copas., MD;  Location: [facility];  Service: Gastroenterology;;  . HEMOSTASIS CLIP PLACEMENT  12/09/2019   Procedure: HEMOSTASIS CLIP PLACEMENT;  Surgeon: Irving Copas., MD;  [facility];  Service:  Gastroenterology;;  . ORIF ANKLE FRACTURE Right 04/27/2015   Procedure: OPEN REDUCTION INTERNAL FIXATION (ORIF) RIGHT BIMALLEOLAR ANKLE FRACTURE WITH SYNDESMOSIS FIXATION;  Surgeon: Leandrew Koyanagi, MD;  Location: [place];  Service: Orthopedics;  Laterality: Right;  . SUBMUCOSAL LIFTING INJECTION  12/09/2019   Procedure: SUBMUCOSAL LIFTING INJECTION;  Surgeon: Rush Landmark Telford Nab., MD;  [facility];  Service: Gastroenterology;;  . TEE WITHOUT CARDIOVERSION N/A 08/01/2016   Procedure: TRANSESOPHAGEAL ECHOCARDIOGRAM (TEE);  Surgeon: Lelon Perla, MD;  Location: [facility] ENDOSCOPY;  Service: Cardiovascular;  Laterality: N/A;  . THYROIDECTOMY  11/24/2013   DR Dalbert Batman  . THYROIDECTOMY N/A 11/24/2013   Procedure: TOTAL THYROIDECTOMY;  Surgeon: Adin Hector, MD;  Location: [place];  Service: General;  Laterality: N/A;  . TRANSTHORACIC ECHOCARDIOGRAM  01/15/2013   EF 55% TO 65%. PROBABLE MILD HYPOKINESIS OF THE INFERIOR MYOCARDIUM. GRADE 1 DIASTOLIC DYSFUNCTION. TRIAL AR.LA IS MILDLY DILATED.   Social History   Social History Narrative  . Not on file   Immunization History  Administered Date(s) Administered  . Fluad Quad(high Dose 65+) 08/22/2019  . Influenza Nasal 11/25/2013  . Influenza, High Dose Seasonal PF 12/30/2014, 10/14/2017, 11/01/2018  . Influenza,inj,Quad PF,6+ Mos 11/25/2013  . Influenza-Unspecified 12/30/2012, 11/09/2015, 12/24/2016  . PFIZER SARS-COV-2 Vaccination 12/29/2019, 01/18/2020  . Pneumococcal Conjugate-13 12/30/2014, 10/14/2017  .  Pneumococcal Polysaccharide-23 11/25/2013, 08/22/2019  . Zoster 12/15/2008  . Zoster Recombinat (Shingrix) 10/14/2017, 01/21/2018     Objective: Vital Signs: There were no vitals taken for this visit.   Physical Exam   Musculoskeletal Exam: ***  CDAI Exam: CDAI Score: -- Patient Global: --; Provider Global: -- Swollen: --; Tender: -- Joint Exam 04/14/2020   No joint exam has been documented for this visit   There is  currently no information documented on the homunculus. Go to the Rheumatology activity and complete the homunculus joint exam.  Investigation: No additional findings.  Imaging: No results found.  Recent Labs: Lab Results  Component Value Date   WBC 3.7 (L) 09/23/2019   HGB 15.6 (H) 01/11/2020   PLT 243 09/23/2019   NA 141 01/11/2020   K 4.1 01/11/2020   CL 105 01/11/2020   CO2 22 10/07/2019   GLUCOSE 92 01/11/2020   BUN 19 01/11/2020   CREATININE 1.10 (H) 01/11/2020   BILITOT 0.7 02/11/2019   ALKPHOS 105 02/11/2019   AST 28 02/11/2019   ALT 35 (H) 02/11/2019   PROT 7.1 02/11/2019   ALBUMIN 4.4 02/11/2019   CALCIUM 9.6 10/07/2019   GFRAA 78 10/07/2019    Speciality Comments: No specialty comments available.  Procedures:  No procedures performed Allergies: Labetalol and Codeine   Assessment / Plan:     Visit Diagnoses: No diagnosis found.  Orders: No orders of the defined types were placed in this encounter.  No orders of the defined types were placed in this encounter.   Face-to-face time spent with patient was *** minutes. Greater than 50% of time was spent in counseling and coordination of care.  Follow-Up Instructions: No follow-ups on file.   Earnestine Mealing, CMA  Note - This record has been created using Editor, commissioning.  Chart creation errors have been sought, but may not always  have been located. Such creation errors do not reflect on  the standard of medical care.

## 2020-04-14 ENCOUNTER — Ambulatory Visit: Payer: PPO | Admitting: Rheumatology

## 2020-04-15 ENCOUNTER — Other Ambulatory Visit: Payer: Self-pay

## 2020-04-15 ENCOUNTER — Ambulatory Visit: Payer: PPO | Admitting: Physician Assistant

## 2020-04-15 ENCOUNTER — Encounter: Payer: Self-pay | Admitting: Physician Assistant

## 2020-04-15 ENCOUNTER — Ambulatory Visit: Payer: Self-pay

## 2020-04-15 VITALS — BP 99/67 | HR 64 | Resp 16 | Ht 65.0 in | Wt 219.8 lb

## 2020-04-15 DIAGNOSIS — M25511 Pain in right shoulder: Secondary | ICD-10-CM

## 2020-04-15 DIAGNOSIS — Z9989 Dependence on other enabling machines and devices: Secondary | ICD-10-CM

## 2020-04-15 DIAGNOSIS — G8929 Other chronic pain: Secondary | ICD-10-CM

## 2020-04-15 DIAGNOSIS — M1A09X0 Idiopathic chronic gout, multiple sites, without tophus (tophi): Secondary | ICD-10-CM

## 2020-04-15 DIAGNOSIS — Z8679 Personal history of other diseases of the circulatory system: Secondary | ICD-10-CM

## 2020-04-15 DIAGNOSIS — G4733 Obstructive sleep apnea (adult) (pediatric): Secondary | ICD-10-CM

## 2020-04-15 DIAGNOSIS — Z5181 Encounter for therapeutic drug level monitoring: Secondary | ICD-10-CM | POA: Diagnosis not present

## 2020-04-15 DIAGNOSIS — M25512 Pain in left shoulder: Secondary | ICD-10-CM

## 2020-04-15 DIAGNOSIS — Z8639 Personal history of other endocrine, nutritional and metabolic disease: Secondary | ICD-10-CM | POA: Diagnosis not present

## 2020-04-15 DIAGNOSIS — E790 Hyperuricemia without signs of inflammatory arthritis and tophaceous disease: Secondary | ICD-10-CM

## 2020-04-15 NOTE — Progress Notes (Signed)
Office Visit Note  Patient: Claudia Cantrell             Date of Birth: February 03, 1946           MRN: [id number]             PCP: Girtha Rm, NP-C Referring: Girtha Rm, NP-C Visit Date: 04/15/2020 Occupation: @GUAROCC @  Subjective:  Pain in both shoulders   History of Present Illness: Claudia Cantrell is a 74 y.o. female with history of gout.  She is taking allopurinol 100 mg po daily.  She denies any gout flares recently.  She presents today with pain in both shoulder joints worse in the right shoulder.  Patient has had chronic right shoulder joint pain for the past several years.  She had a cortisone injection in the right shoulder on 10/30/2018 which provided significant pain relief.  She has been having worsening discomfort over the past 2 weeks.  She did not have an injury prior to onset of symptoms.  She currently rates the pain a 10 out of 10.  She has been taking Tylenol as needed for pain relief.  She describes the pain as a constant dull ache.  According to the patient the discomfort has started to interfere with her activities. She denies any other joint pain or joint swelling at this time.   Activities of Daily Living:  Patient reports joint stiffness all day  Patient Reports nocturnal pain.  Difficulty dressing/grooming: Denies Difficulty climbing stairs: Denies Difficulty getting out of chair: Denies Difficulty using hands for taps, buttons, cutlery, and/or writing: Denies  Review of Systems  Constitutional: Positive for fatigue.  HENT: Negative for mouth sores, mouth dryness and nose dryness.   Eyes: Negative for pain, itching, visual disturbance and dryness.  Respiratory: Negative for cough, hemoptysis, shortness of breath and difficulty breathing.   Cardiovascular: Positive for swelling in legs/feet. Negative for chest pain, palpitations and hypertension.  Gastrointestinal: Negative for blood in stool, constipation and diarrhea.  Endocrine: Negative for increased  urination.  Genitourinary: Negative for difficulty urinating and painful urination.  Musculoskeletal: Positive for arthralgias, joint pain, myalgias, morning stiffness and myalgias. Negative for joint swelling, muscle weakness and muscle tenderness.  Skin: Negative for color change, pallor, rash, hair loss, nodules/bumps, redness, skin tightness, ulcers and sensitivity to sunlight.  Allergic/Immunologic: Negative for susceptible to infections.  Neurological: Negative for numbness, headaches and memory loss.  Hematological: Negative for bruising/bleeding tendency and swollen glands.  Psychiatric/Behavioral: Positive for sleep disturbance. Negative for depressed mood and confusion. The patient is not nervous/anxious.     PMFS History:  Patient Active Problem List   Diagnosis Date Noted  . Acute combined systolic and diastolic CHF, NYHA class 3 ([place]) 09/23/2019  . Persistent atrial fibrillation ([place]) 09/21/2019  . Submucosal lesion of stomach 07/19/2019  . Abnormal CT of the abdomen 07/19/2019  . Abnormal CT scan, stomach 06/17/2019  . Advance directive discussed with patient 01/14/2019  . Chronic anticoagulation 01/14/2019  . CRI (chronic renal insufficiency), stage 2 (mild) 01/14/2019  . Prediabetes 01/14/2019  . Persistent proteinuria 02/17/2018  . 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 Vitamin D deficiency 03/26/2017  . Medication monitoring encounter 03/26/2017  . Gout 10/08/2016  . PAF (paroxysmal atrial fibrillation) ([place])   . Hypertensive heart disease   . Hyperlipidemia   . CAD S/P percutaneous coronary angioplasty   . Nonischemic cardiomyopathy ([place])   . Chest pain with moderate risk for cardiac  etiology 08/02/2016  . Closed right ankle fracture 04/24/2015  . Ankle fracture, bimalleolar, closed 04/24/2015  . 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 OSA on CPAP 10/09/2013    Past Medical History:  Diagnosis Date  . Arthritis     rheumatoid  . CAD (coronary artery disease)    a. 1992 s/p MI and PTCA of unknown vessel;  b. 09/2010 Cath: LM nl, LAD 20p, LCX 33m, RCA 4m, RPL 20.  Marland Kitchen Cervical cancer ([place]) 1977  . Family history of anesthesia complication    daughter has difficulty waking   . GERD (gastroesophageal reflux disease)    occ  . Gout 10/08/2016  . Hyperlipidemia   . Hypertensive heart disease   . Hypothyroidism   . Nonischemic cardiomyopathy ([place])    a. 07/2016 Echo: EF 40-45%, mild LVH, inferior akinesis, moderately dilated left atrium, trivial AI and MR.  Marland Kitchen PAF (paroxysmal atrial fibrillation) ([place])    a. 07/2016 Admitted w/ AF RVR-->CHA2DS2VASc = 5-->Eliquis;  b. 07/2016 successful TEE/DCCV.  Marland Kitchen Sleep apnea    a. Using CPAP.    Family History  Problem Relation Age of Onset  . Heart disease Mother   . Sudden death Mother   . Diabetes Father   . Stroke Father   . Bone cancer Sister   . Sudden death Brother   . Melanoma Brother   . Heart disease Brother   . Colon cancer Neg Hx   . Esophageal cancer Neg Hx   . Inflammatory bowel disease Neg Hx   . Liver disease Neg Hx   . Pancreatic cancer Neg Hx   . Rectal cancer Neg Hx   . Stomach cancer Neg Hx    Past Surgical History:  Procedure Laterality Date  . ABDOMINAL HYSTERECTOMY  1988  . BACK SURGERY  1994  . BIOPSY  09/02/2019   Procedure: BIOPSY;  Surgeon: Rush Landmark Telford Nab., MD;  Location: [facility];  Service: Gastroenterology;;  . BIOPSY  12/09/2019   Procedure: BIOPSY;  Surgeon: Irving Copas., MD;  Location: [facility];  Service: Gastroenterology;;  . [place]   PTCA BY DR Lamount Cohen  . CARDIOVERSION N/A 08/01/2016   Procedure: CARDIOVERSION;  Surgeon: Lelon Perla, MD;  Location: [facility] ENDOSCOPY;  Service: Cardiovascular;  Laterality: N/A;  . CARDIOVERSION N/A 01/11/2020   Procedure: CARDIOVERSION;  Surgeon: Josue Hector, MD;  Location: [place];  Service: Cardiovascular;   Laterality: N/A;  . ENDOSCOPIC MUCOSAL RESECTION N/A 12/09/2019   Procedure: ENDOSCOPIC MUCOSAL RESECTION;  Surgeon: Rush Landmark Telford Nab., MD;  Location: [facility] ENDOSCOPY;  Service: Gastroenterology;  Laterality: N/A;  . ESOPHAGOGASTRODUODENOSCOPY (EGD) WITH PROPOFOL N/A 09/02/2019   Procedure: ESOPHAGOGASTRODUODENOSCOPY (EGD) WITH PROPOFOL;  Surgeon: Rush Landmark Telford Nab., MD;  Location: [facility] END[facility];  Service: Gastroenterology;  Laterality: N/A;  . ESOPHAGOGASTRODUODENOSCOPY (EGD) WITH PROPOFOL N/A 12/09/2019   Procedure: ESOPHAGOGASTRODUODENOSCOPY (EGD) WITH PROPOFOL;  Surgeon: Rush Landmark Telford Nab., MD;  Location: [facility] END[facility];  Service: Gastroenterology;  Laterality: N/A;  . EUS N/A 09/02/2019   Procedure: UPPER ENDOSCOPIC ULTRASOUND (EUS) RADIAL;  Surgeon: Rush Landmark Telford Nab., MD;  Location: [facility];  Service: Gastroenterology;  Laterality: N/A;  EUS radial/linear  . EUS N/A 12/09/2019   Procedure: UPPER ENDOSCOPIC ULTRASOUND (EUS) RADIAL;  Surgeon: Irving Copas., MD;  Location: [facility];  Service: Gastroenterology;  Laterality: N/A;  . EUS  12/09/2019   Procedure: UPPER ENDOSCOPIC ULTRASOUND (EUS) LINEAR;  Surgeon: Irving Copas., MD;  Location: [facility];  Service: Gastroenterology;;  .  FINE NEEDLE ASPIRATION  09/02/2019   Procedure: FINE NEEDLE ASPIRATION (FNA) LINEAR;  Surgeon: Irving Copas., MD;  Location: [facility] ENDOS[facility];  Service: Gastroenterology;;  . FINE NEEDLE ASPIRATION  12/09/2019   Procedure: FINE NEEDLE ASPIRATION (FNA) LINEAR;  Surgeon: Irving Copas., MD;  Location: [facility];  Service: Gastroenterology;;  . HEMOSTASIS CLIP PLACEMENT  12/09/2019   Procedure: HEMOSTASIS CLIP PLACEMENT;  Surgeon: Irving Copas., MD;  Location: [facility];  Service: Gastroenterology;;  . ORIF ANKLE FRACTURE Right 04/27/2015   Procedure: OPEN REDUCTION INTERNAL FIXATION (ORIF) RIGHT BIMALLEOLAR ANKLE FRACTURE WITH SYNDESMOSIS  FIXATION;  Surgeon: Leandrew Koyanagi, MD;  Location: [place];  Service: Orthopedics;  Laterality: Right;  . SUBMUCOSAL LIFTING INJECTION  12/09/2019   Procedure: SUBMUCOSAL LIFTING INJECTION;  Surgeon: Rush Landmark Telford Nab., MD;  Location: [facility];  Service: Gastroenterology;;  . TEE WITHOUT CARDIOVERSION N/A 08/01/2016   Procedure: TRANSESOPHAGEAL ECHOCARDIOGRAM (TEE);  Surgeon: Lelon Perla, MD;  Location: [facility] ENDOSCOPY;  Service: Cardiovascular;  Laterality: N/A;  . THYROIDECTOMY  11/24/2013   DR Dalbert Batman  . THYROIDECTOMY N/A 11/24/2013   Procedure: TOTAL THYROIDECTOMY;  Surgeon: Adin Hector, MD;  Location: [place];  Service: General;  Laterality: N/A;  . TRANSTHORACIC ECHOCARDIOGRAM  01/15/2013   EF 55% TO 65%. PROBABLE MILD HYPOKINESIS OF THE INFERIOR MYOCARDIUM. GRADE 1 DIASTOLIC DYSFUNCTION. TRIAL AR.LA IS MILDLY DILATED.   Social History   Social History Narrative  . Not on file   Immunization History  Administered Date(s) Administered  . Fluad Quad(high Dose 65+) 08/22/2019  . Influenza Nasal 11/25/2013  . Influenza, High Dose Seasonal PF 12/30/2014, 10/14/2017, 11/01/2018  . Influenza,inj,Quad PF,6+ Mos 11/25/2013  . Influenza-Unspecified 12/30/2012, 11/09/2015, 12/24/2016  . PFIZER SARS-COV-2 Vaccination 12/29/2019, 01/18/2020  . Pneumococcal Conjugate-13 12/30/2014, 10/14/2017  . Pneumococcal Polysaccharide-23 11/25/2013, 08/22/2019  . Zoster 12/15/2008  . Zoster Recombinat (Shingrix) 10/14/2017, 01/21/2018     Objective: Vital Signs: BP 99/67 (BP Location: Left Arm, Patient Position: Sitting, Cuff Size: Normal)   Pulse 64   Resp 16   Ht 5\' 5"  (1.651 m)   Wt 219 lb 12.8 oz (99.7 kg)   BMI 36.58 kg/m    Physical Exam Vitals and nursing note reviewed.  Constitutional:      Appearance: She is well-developed.  HENT:     Head: Normocephalic and atraumatic.  Eyes:     Conjunctiva/sclera: Conjunctivae normal.  Pulmonary:     Effort: Pulmonary effort is  normal.  Abdominal:     General: Bowel sounds are normal.     Palpations: Abdomen is soft.  Musculoskeletal:     Cervical back: Normal range of motion.  Lymphadenopathy:     Cervical: No cervical adenopathy.  Skin:    General: Skin is warm and dry.     Capillary Refill: Capillary refill takes less than 2 seconds.  Neurological:     Mental Status: She is alert and oriented to person, place, and time.  Psychiatric:        Behavior: Behavior normal.      Musculoskeletal Exam: C-spine limited range of motion with lateral rotation.  Good flexion-extension of C-spine noted.  Thoracic and lumbar spine have good range of motion.  Right shoulder has full passive range of motion and limited active range of motion with discomfort.  Left shoulder is full range of motion with discomfort.  Elbow joints, wrist joints, MCPs, PIPs and DIPs good range of motion with no synovitis.  She has complete fist formation bilaterally.  Hip joints have good range of motion with no discomfort.  Knee joints have good range of motion with no warmth or effusion.  Pedal edema noted bilaterally.  CDAI Exam: CDAI Score: -- Patient Global: --; Provider Global: -- Swollen: --; Tender: -- Joint Exam 04/15/2020   No joint exam has been documented for this visit   There is currently no information documented on the homunculus. Go to the Rheumatology activity and complete the homunculus joint exam.  Investigation: No additional findings.  Imaging: No results found.  Recent Labs: Lab Results  Component Value Date   WBC 3.7 (L) 09/23/2019   HGB 15.6 (H) 01/11/2020   PLT 243 09/23/2019   NA 141 01/11/2020   K 4.1 01/11/2020   CL 105 01/11/2020   CO2 22 10/07/2019   GLUCOSE 92 01/11/2020   BUN 19 01/11/2020   CREATININE 1.10 (H) 01/11/2020   BILITOT 0.7 02/11/2019   ALKPHOS 105 02/11/2019   AST 28 02/11/2019   ALT 35 (H) 02/11/2019   PROT 7.1 02/11/2019   ALBUMIN 4.4 02/11/2019   CALCIUM 9.6 10/07/2019    GFRAA 78 10/07/2019    Speciality Comments: No specialty comments available.  Procedures:  Large Joint Inj on 04/15/2020 11:26 AM Indications: pain Details: 27 G 1.5 in needle, posterior approach  Arthrogram: No  Medications: 1.5 mL lidocaine 1 %; 40 mg triamcinolone acetonide 40 MG/ML Aspirate: 0 mL Outcome: tolerated well, no immediate complications Procedure, treatment alternatives, risks and benefits explained, specific risks discussed. Consent was given by the patient. Immediately prior to procedure a time out was called to verify the correct patient, procedure, equipment, support staff and site/side marked as required. Patient was prepped and draped in the usual sterile fashion.     Allergies: Labetalol and Codeine       Assessment / Plan:     Visit Diagnoses: Idiopathic chronic gout of multiple sites without tophus: She has not had any signs or symptoms of a gout flare. She is taking allopurinol 100 mg po daily for management of gout. Uric acid was 8.3 on 01/07/20.  We discussed that ideally her uric acid level should be below 6.  She was advised to continue to avoid a purine rich diet.  She will continue taking allopurinol as prescribed.  She was advised to notify us if she develop signs or symptoms of a gout flare.  She will follow-up in the office in 6 months.  Medication monitoring encounter: CBC and CMP were drawn on 01/07/2020.  Hyperuricemia: Uric acid was 8.3 on 01/07/2020.  She is taking allopurinol 100 mg daily.  Chronic pain of both shoulders - She presents today with pain in both shoulder joints, especially the right shoulder joint.  She has chronic pain in the right shoulder joint which has been worsening over the past 2 weeks.  She did not have an injury prior to the onset of symptoms.  She describes the pain as a constant dull ache.  She denies any nocturnal pain.  According to the patient her shoulder joint pain has started to limit her activities due to the  discomfort.  She has limited active ROM and full passive ROM of the right shoulder joint on exam on exam.  Left shoulder joint has good range of motion.  X-rays of both shoulders were obtained today.  She had a right shoulder joint cortisone injection on 10/30/2018 which provided significant pain relief.  Different treatment options were discussed.  She requested a right shoulder joint cortisone  injection.  She tolerated the procedure well.  The procedure note was completed above.  She was given a handout of shoulder exercises to perform.  She was advised to notify us if her shoulder joint pain persists or worsens.  Plan: XR Shoulder Right, XR Shoulder Left  History of vitamin D deficiency: Vitamin D was 39.6 on 01/14/2019.  She is taking vitamin D 5000 units daily.  Other medical conditions are listed as follows:  OSA on CPAP  History of coronary artery disease  History of hypertension  History of hypothyroidism  History of atrial fibrillation  History of hyperlipidemia    Orders: Orders Placed This Encounter  Procedures  . Large Joint Inj  . XR Shoulder Right  . XR Shoulder Left   No orders of the defined types were placed in this encounter.   Face-to-face time spent with patient was 30 minutes. Greater than 50% of time was spent in counseling and coordination of care.  Follow-Up Instructions: Return in about 6 months (around 10/16/2020) for Gout.   Ofilia Neas, PA-C   I examined and evaluated the patient with Hazel Sams PA.  Patient returns today with pain and discomfort in her bilateral shoulders.  She had good range of motion of bilateral shoulder joints with discomfort.  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  Per her request after informed consent also obtained I injected her right shoulder joint with cortisone as described above.  She tolerated the procedure well.  Postprocedure instructions were given.  A handout on  exercises was given.  The plan of care was discussed as noted above.  Bo Merino, MD  Note - This record has been created using Editor, commissioning.  Chart creation errors have been sought, but may not always  have been located. Such creation errors do not reflect on  the standard of medical care.

## 2020-04-15 NOTE — Patient Instructions (Signed)
Shoulder Exercises Ask your health care provider which exercises are safe for you. Do exercises exactly as told by your health care provider and adjust them as directed. It is normal to feel mild stretching, pulling, tightness, or discomfort as you do these exercises. Stop right away if you feel sudden pain or your pain gets worse. Do not begin these exercises until told by your health care provider. Stretching exercises External rotation and abduction This exercise is sometimes called corner stretch. This exercise rotates your arm outward (external rotation) and moves your arm out from your body (abduction). 1. Stand in a doorway with one of your feet slightly in front of the other. This is called a staggered stance. If you cannot reach your forearms to the door frame, stand facing a corner of a room. 2. Choose one of the following positions as told by your health care provider: ? Place your hands and forearms on the door frame above your head. ? Place your hands and forearms on the door frame at the height of your head. ? Place your hands on the door frame at the height of your elbows. 3. Slowly move your weight onto your front foot until you feel a stretch across your chest and in the front of your shoulders. Keep your head and chest upright and keep your abdominal muscles tight. 4. Hold for __________ seconds. 5. To release the stretch, shift your weight to your back foot. Repeat __________ times. Complete this exercise __________ times a day. Extension, standing 1. Stand and hold a broomstick, a cane, or a similar object behind your back. ? Your hands should be a little wider than shoulder width apart. ? Your palms should face away from your back. 2. Keeping your elbows straight and your shoulder muscles relaxed, move the stick away from your body until you feel a stretch in your shoulders (extension). ? Avoid shrugging your shoulders while you move the stick. Keep your shoulder blades tucked  down toward the middle of your back. 3. Hold for __________ seconds. 4. Slowly return to the starting position. Repeat __________ times. Complete this exercise __________ times a day. Range-of-motion exercises Pendulum  1. Stand near a wall or a surface that you can hold onto for balance. 2. Bend at the waist and let your left / right arm hang straight down. Use your other arm to support you. Keep your back straight and do not lock your knees. 3. Relax your left / right arm and shoulder muscles, and move your hips and your trunk so your left / right arm swings freely. Your arm should swing because of the motion of your body, not because you are using your arm or shoulder muscles. 4. Keep moving your hips and trunk so your arm swings in the following directions, as told by your health care provider: ? Side to side. ? Forward and backward. ? In clockwise and counterclockwise circles. 5. Continue each motion for __________ seconds, or for as long as told by your health care provider. 6. Slowly return to the starting position. Repeat __________ times. Complete this exercise __________ times a day. Shoulder flexion, standing  1. Stand and hold a broomstick, a cane, or a similar object. Place your hands a little more than shoulder width apart on the object. Your left / right hand should be palm up, and your other hand should be palm down. 2. Keep your elbow straight and your shoulder muscles relaxed. Push the stick up with your healthy arm to   raise your left / right arm in front of your body, and then over your head until you feel a stretch in your shoulder (flexion). ? Avoid shrugging your shoulder while you raise your arm. Keep your shoulder blade tucked down toward the middle of your back. 3. Hold for __________ seconds. 4. Slowly return to the starting position. Repeat __________ times. Complete this exercise __________ times a day. Shoulder abduction, standing 1. Stand and hold a broomstick,  a cane, or a similar object. Place your hands a little more than shoulder width apart on the object. Your left / right hand should be palm up, and your other hand should be palm down. 2. Keep your elbow straight and your shoulder muscles relaxed. Push the object across your body toward your left / right side. Raise your left / right arm to the side of your body (abduction) until you feel a stretch in your shoulder. ? Do not raise your arm above shoulder height unless your health care provider tells you to do that. ? If directed, raise your arm over your head. ? Avoid shrugging your shoulder while you raise your arm. Keep your shoulder blade tucked down toward the middle of your back. 3. Hold for __________ seconds. 4. Slowly return to the starting position. Repeat __________ times. Complete this exercise __________ times a day. Internal rotation  1. Place your left / right hand behind your back, palm up. 2. Use your other hand to dangle an exercise band, a towel, or a similar object over your shoulder. Grasp the band with your left / right hand so you are holding on to both ends. 3. Gently pull up on the band until you feel a stretch in the front of your left / right shoulder. The movement of your arm toward the center of your body is called internal rotation. ? Avoid shrugging your shoulder while you raise your arm. Keep your shoulder blade tucked down toward the middle of your back. 4. Hold for __________ seconds. 5. Release the stretch by letting go of the band and lowering your hands. Repeat __________ times. Complete this exercise __________ times a day. Strengthening exercises External rotation  1. Sit in a stable chair without armrests. 2. Secure an exercise band to a stable object at elbow height on your left / right side. 3. Place a soft object, such as a folded towel or a small pillow, between your left / right upper arm and your body to move your elbow about 4 inches (10 cm) away  from your side. 4. Hold the end of the exercise band so it is tight and there is no slack. 5. Keeping your elbow pressed against the soft object, slowly move your forearm out, away from your abdomen (external rotation). Keep your body steady so only your forearm moves. 6. Hold for __________ seconds. 7. Slowly return to the starting position. Repeat __________ times. Complete this exercise __________ times a day. Shoulder abduction  1. Sit in a stable chair without armrests, or stand up. 2. Hold a __________ weight in your left / right hand, or hold an exercise band with both hands. 3. Start with your arms straight down and your left / right palm facing in, toward your body. 4. Slowly lift your left / right hand out to your side (abduction). Do not lift your hand above shoulder height unless your health care provider tells you that this is safe. ? Keep your arms straight. ? Avoid shrugging your shoulder while you   do this movement. Keep your shoulder blade tucked down toward the middle of your back. 5. Hold for __________ seconds. 6. Slowly lower your arm, and return to the starting position. Repeat __________ times. Complete this exercise __________ times a day. Shoulder extension 1. Sit in a stable chair without armrests, or stand up. 2. Secure an exercise band to a stable object in front of you so it is at shoulder height. 3. Hold one end of the exercise band in each hand. Your palms should face each other. 4. Straighten your elbows and lift your hands up to shoulder height. 5. Step back, away from the secured end of the exercise band, until the band is tight and there is no slack. 6. Squeeze your shoulder blades together as you pull your hands down to the sides of your thighs (extension). Stop when your hands are straight down by your sides. Do not let your hands go behind your body. 7. Hold for __________ seconds. 8. Slowly return to the starting position. Repeat __________ times.  Complete this exercise __________ times a day. Shoulder row 1. Sit in a stable chair without armrests, or stand up. 2. Secure an exercise band to a stable object in front of you so it is at waist height. 3. Hold one end of the exercise band in each hand. Position your palms so that your thumbs are facing the ceiling (neutral position). 4. Bend each of your elbows to a 90-degree angle (right angle) and keep your upper arms at your sides. 5. Step back until the band is tight and there is no slack. 6. Slowly pull your elbows back behind you. 7. Hold for __________ seconds. 8. Slowly return to the starting position. Repeat __________ times. Complete this exercise __________ times a day. Shoulder press-ups  1. Sit in a stable chair that has armrests. Sit upright, with your feet flat on the floor. 2. Put your hands on the armrests so your elbows are bent and your fingers are pointing forward. Your hands should be about even with the sides of your body. 3. Push down on the armrests and use your arms to lift yourself off the chair. Straighten your elbows and lift yourself up as much as you comfortably can. ? Move your shoulder blades down, and avoid letting your shoulders move up toward your ears. ? Keep your feet on the ground. As you get stronger, your feet should support less of your body weight as you lift yourself up. 4. Hold for __________ seconds. 5. Slowly lower yourself back into the chair. Repeat __________ times. Complete this exercise __________ times a day. Wall push-ups  1. Stand so you are facing a stable wall. Your feet should be about one arm-length away from the wall. 2. Lean forward and place your palms on the wall at shoulder height. 3. Keep your feet flat on the floor as you bend your elbows and lean forward toward the wall. 4. Hold for __________ seconds. 5. Straighten your elbows to push yourself back to the starting position. Repeat __________ times. Complete this exercise  __________ times a day. This information is not intended to replace advice given to you by your health care provider. Make sure you discuss any questions you have with your health care provider. Document Revised: 03/20/2019 Document Reviewed: 12/26/2018 Elsevier Patient Education  2020 [employer]  

## 2020-04-19 NOTE — Progress Notes (Signed)
Claudia Cantrell is a 74 y.o. female who presents for annual wellness visit, CPE and follow-up on chronic medical conditions.  She has the following concerns:  She is not fasting and would like to return in the morning for fasting labs.   HTN- states her BP has been running low at home . Lowest BP at home was 96/60.  HCTZ 12.5 mg, amlodipine 10 mg, hydralazine10 mg, olmesartan 40 mg, carvedilol 25 mg. Denies dizziness, syncope, fatigue, chest pain, palpitations, LE edema.   Taking Eliquis for A-fib. No bleeding or bruising issues.   Followed by cardiology for A-fib, CHF, HTN and HL.   Has gained weight. Started baking and eating more over the past few months.  States she is trying to be more active.   HL- taking rosuvastatin daily without side effects.   Gout- taking allopurinol. No gout flares.   Vitamin D- states she is taking 2,000 IUs daily   Hx of normal DEXA in 2017.   Hypothyroidism- hx of thyroidectomy. Taking Synthroid 112 mcg every morning on empty stomach. No side effects.   Hx of prediabetes and would like to have her A1c checked.   Denies fever, chills, dizziness, chest pain, palpitations, shortness of breath, abdominal pain, N/V/D, urinary symptoms, LE edema.     Immunization History  Administered Date(s) Administered  . Fluad Quad(high Dose 65+) 08/22/2019  . Influenza Nasal 11/25/2013  . Influenza, High Dose Seasonal PF 12/30/2014, 10/14/2017, 11/01/2018  . Influenza,inj,Quad PF,6+ Mos 11/25/2013  . Influenza-Unspecified 12/30/2012, 11/09/2015, 12/24/2016  . PFIZER SARS-COV-2 Vaccination 12/29/2019, 01/18/2020  . Pneumococcal Conjugate-13 12/30/2014, 10/14/2017  . Pneumococcal Polysaccharide-23 11/25/2013, 08/22/2019  . Zoster 12/15/2008  . Zoster Recombinat (Shingrix) 10/14/2017, 01/21/2018   Last Pap smear: 2020 Last mammogram: 02/21 Last colonoscopy: went to GI  Last DEXA: 2017 Dentist: partials and needs to see a dentist  Ophtho: [facility] one month ago  Exercise: pulling weeds in garden  Other doctors caring for patient include: Dr. Estanislado Pandy- Rheumatology Dr. Claiborne Billings- Cardiology Dr. Rush LandmarkSadie [facility] Dr. Carolin Sicks- Nephrologist  Dr. Garwin Brothers- OB/GYN   Depression screen:  See questionnaire below.  Depression screen [facility] 2/9 04/20/2020 01/14/2019 01/13/2018 12/11/2017 08/29/2016  Decreased Interest 0 0 0 0 0  Down, Depressed, Hopeless 0 1 0 0 0  PHQ - 2 Score 0 1 0 0 0    Fall Risk Screen: see questionnaire below. Fall Risk  04/20/2020 01/13/2018 08/29/2016  Falls in the past year? 0 No No  Number falls in past yr: 0 - -  Injury with Fall? 0 - -    ADL screen:  See questionnaire below Functional Status Survey: Is the patient deaf or have difficulty hearing?: No Does the patient have difficulty seeing, even when wearing glasses/contacts?: No Does the patient have difficulty concentrating, remembering, or making decisions?: No Does the patient have difficulty walking or climbing stairs?: No Does the patient have difficulty dressing or bathing?: No Does the patient have difficulty doing errands alone such as visiting a doctor's office or shopping?: No   End of Life Discussion:  Patient has a living will and medical power of attorney. States she is not interested in having living will or HCPOA. States her 3 children will decide. MOST form reviewed and signed.   Review of Systems Constitutional: -fever, -chills, -sweats, -unexpected weight change, -anorexia, -fatigue Allergy: -sneezing, -itching, -congestion Dermatology: denies changing moles, rash, lumps, new worrisome lesions ENT: -runny nose, -ear pain, -sore throat, -hoarseness, -sinus pain, -teeth pain, -tinnitus, -  hearing loss, -epistaxis Cardiology:  -chest pain, -palpitations, -edema, -orthopnea, -paroxysmal nocturnal dyspnea Respiratory: -cough, -shortness of breath, -dyspnea on exertion, -wheezing, -hemoptysis Gastroenterology: -abdominal pain, -nausea,  -vomiting, -diarrhea, -constipation, -blood in stool, -changes in bowel movement, -dysphagia Hematology: -bleeding or bruising problems Musculoskeletal: -arthralgias, -myalgias, -joint swelling, -back pain, -neck pain, -cramping, -gait changes Ophthalmology: -vision changes, -eye redness, -itching, -discharge Urology: -dysuria, -difficulty urinating, -hematuria, -urinary frequency, -urgency, incontinence Neurology: -headache, -weakness, -tingling, -numbness, -speech abnormality, -memory loss, -falls, -dizziness Psychology:  -depressed mood, -agitation, -sleep problems    PHYSICAL EXAM:  BP 120/70   Pulse 89   Ht 5\' 5"  (1.651 m)   Wt 219 lb 6.4 oz (99.5 kg)   BMI 36.51 kg/m   General Appearance: Alert, cooperative, no distress, appears stated age Head: Normocephalic, without obvious abnormality, atraumatic Eyes: PERRL, conjunctiva/corneas clear, EOM's intact Ears: Normal TM's and external ear canals Nose: Mask in place  Throat: Mask in place  Neck: Supple, no lymphadenopathy; thyroid: no enlargement/tenderness/nodules; no JVD Back: Spine nontender, no curvature, ROM normal, no CVA tenderness Lungs: Clear to auscultation bilaterally without wheezes, rales or ronchi; respirations unlabored Chest Wall: No tenderness or deformity Heart: Regular rate, irregular rhythm.  Breast Exam: declines Abdomen: Soft, non-tender, nondistended, normoactive bowel sounds, no masses, no hepatosplenomegaly Genitalia: declines  Extremities: No clubbing, cyanosis or edema Pulses: 2+ and symmetric all extremities Skin: Skin color, texture, turgor normal, no rashes or lesions Lymph nodes: Cervical, supraclavicular, and axillary nodes normal Neurologic: CNII-XII intact, normal strength, sensation and gait; reflexes 2+ and symmetric throughout Psych: Normal mood, affect, hygiene and grooming.  ASSESSMENT/PLAN: Medicare annual wellness visit, subsequent -Here today for Medicare wellness visit.  She  denies any concerns with memory, depression, difficulty with ADLs and no recent falls.  She is in good spirits.  Counseling on advanced directives.  Medications reviewed  Routine general medical examination at a health care facility - Plan: CBC with Differential/Platelet, Comprehensive metabolic panel -Discussed preventive healthcare, immunizations and safety.  Counseling on healthy lifestyle including diet and exercise.  Postsurgical hypothyroidism - Plan: TSH, T4, free -She appears to be taking her Synthroid appropriately.  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  She plans to schedule with her cardiologist regarding her current medication regimen.  She thinks her blood pressure is too low at times at home but denies being symptomatic.  Prediabetes - Plan: Hemoglobin A1c -Check A1c and follow-up.  Counseling on reducing carbohydrates and sugar intake.  We also discussed increasing physical activity.  Acute combined systolic and diastolic CHF, NYHA class 3 (HCC) -Appears euvolemic.  Asymptomatic.  Followed by cardiology  Persistent atrial fibrillation ([place]) -Asymptomatic.  She is on Eliquis and doing fine  Pure hypercholesterolemia - Plan: Lipid panel -Check lipid panel.  Continue statin therapy.  OSA on CPAP -Reports using CPAP nightly and benefiting from this.  I recommend she continue with her CPAP.  Idiopathic chronic gout of multiple sites without tophus - Plan: Uric acid -No recent gout flares.  Check uric acid level and follow-up.  Continue on allopurinol.  Advance directive declined by patient -MOST  form reviewed and signed but she does not have any interest in advanced directives.  Discussed monthly self breast exams and yearly mammograms; at least 30 minutes of aerobic activity at least 5 days/week and weight-bearing exercise 2x/week; proper sunscreen use reviewed; healthy diet, including goals of calcium and vitamin D intake and alcohol recommendations (less than or equal to 1 drink/day) reviewed; regular seatbelt use; changing batteries in smoke detectors.  Immunization recommendations discussed.  Colonoscopy recommendations reviewed   Medicare Attestation I have personally reviewed: The patient's medical and social history Their use of alcohol, tobacco or illicit drugs Their current medications and supplements The patient's functional ability including ADLs,fall risks, home safety risks, cognitive, and hearing and visual impairment Diet and physical activities Evidence for depression or mood disorders  The patient's weight, height, and BMI have been recorded in the chart.  I have made referrals, counseling, and provided education to the patient based on review of the above and I have provided the patient with a written personalized care plan for preventive services.     Harland Dingwall, NP-C   04/20/2020

## 2020-04-20 ENCOUNTER — Other Ambulatory Visit: Payer: Self-pay

## 2020-04-20 ENCOUNTER — Other Ambulatory Visit: Payer: Self-pay | Admitting: Cardiology

## 2020-04-20 ENCOUNTER — Encounter: Payer: Self-pay | Admitting: Family Medicine

## 2020-04-20 ENCOUNTER — Ambulatory Visit (INDEPENDENT_AMBULATORY_CARE_PROVIDER_SITE_OTHER): Payer: PPO | Admitting: Family Medicine

## 2020-04-20 VITALS — BP 120/70 | HR 89 | Ht 65.0 in | Wt 219.4 lb

## 2020-04-20 DIAGNOSIS — I4819 Other persistent atrial fibrillation: Secondary | ICD-10-CM | POA: Diagnosis not present

## 2020-04-20 DIAGNOSIS — G4733 Obstructive sleep apnea (adult) (pediatric): Secondary | ICD-10-CM | POA: Diagnosis not present

## 2020-04-20 DIAGNOSIS — Z0000 Encounter for general adult medical examination without abnormal findings: Secondary | ICD-10-CM | POA: Diagnosis not present

## 2020-04-20 DIAGNOSIS — Z789 Other specified health status: Secondary | ICD-10-CM

## 2020-04-20 DIAGNOSIS — I5043 Acute on chronic combined systolic (congestive) and diastolic (congestive) heart failure: Secondary | ICD-10-CM | POA: Diagnosis not present

## 2020-04-20 DIAGNOSIS — E7800 Pure hypercholesterolemia, unspecified: Secondary | ICD-10-CM

## 2020-04-20 DIAGNOSIS — Z9989 Dependence on other enabling machines and devices: Secondary | ICD-10-CM

## 2020-04-20 DIAGNOSIS — I5041 Acute combined systolic (congestive) and diastolic (congestive) heart failure: Secondary | ICD-10-CM | POA: Diagnosis not present

## 2020-04-20 DIAGNOSIS — E890 Postprocedural hypothyroidism: Secondary | ICD-10-CM

## 2020-04-20 DIAGNOSIS — R7303 Prediabetes: Secondary | ICD-10-CM

## 2020-04-20 DIAGNOSIS — M1A09X0 Idiopathic chronic gout, multiple sites, without tophus (tophi): Secondary | ICD-10-CM

## 2020-04-20 DIAGNOSIS — I110 Hypertensive heart disease with heart failure: Secondary | ICD-10-CM

## 2020-04-20 DIAGNOSIS — E559 Vitamin D deficiency, unspecified: Secondary | ICD-10-CM | POA: Diagnosis not present

## 2020-04-20 NOTE — Patient Instructions (Signed)
Claudia Cantrell , Thank you for taking time to come for your Medicare Wellness Visit. I appreciate your ongoing commitment to your health goals. Please review the following plan we discussed and let me know if I can assist you in the future.   These are the goals we discussed:  Call and schedule your follow up with cardiology regarding blood pressure and other chronic health conditions.   Find your last Tdap (tetanus shot) date and let me know please.   Cut back on calories and carbohydrates and increase your physical activity.    This is a list of the screening recommended for you and due dates:  Health Maintenance  Topic Date Due  . Tetanus Vaccine  Never done  . Colon Cancer Screening  Never done  . Flu Shot  07/10/2020  . Mammogram  02/03/2022  . DEXA scan (bone density measurement)  Completed  . COVID-19 Vaccine  Completed  .  Hepatitis C: One time screening is recommended by Center for Disease Control  (CDC) for  adults born from [birth year] through 1965.   Completed  . Pneumonia vaccines  Completed    Preventive Care 13 Years and Older, Female Preventive care refers to lifestyle choices and visits with your health care provider that can promote health and wellness. This includes:  A yearly physical exam. This is also called an annual well check.  Regular dental and eye exams.  Immunizations.  Screening for certain conditions.  Healthy lifestyle choices, such as diet and exercise. What can I expect for my preventive care visit? Physical exam Your health care provider will check:  Height and weight. These may be used to calculate body mass index (BMI), which is a measurement that tells if you are at a healthy weight.  Heart rate and blood pressure.  Your skin for abnormal spots. Counseling Your health care provider may ask you questions about:  Alcohol, tobacco, and drug use.  Emotional well-being.  Home and relationship well-being.  Sexual activity.  Eating habits.   History of falls.  Memory and ability to understand (cognition).  Work and work Statistician.  Pregnancy and menstrual history. What immunizations do I need?  Influenza (flu) vaccine  This is recommended every year. Tetanus, diphtheria, and pertussis (Tdap) vaccine  You may need a Td booster every 10 years. Varicella (chickenpox) vaccine  You may need this vaccine if you have not already been vaccinated. Zoster (shingles) vaccine  You may need this after age 74. Pneumococcal conjugate (PCV13) vaccine  One dose is recommended after age 68. Pneumococcal polysaccharide (PPSV23) vaccine  One dose is recommended after age 19. Measles, mumps, and rubella (MMR) vaccine  You may need at least one dose of MMR if you were born in 1957 or later. You may also need a second dose. Meningococcal conjugate (MenACWY) vaccine  You may need this if you have certain conditions. Hepatitis A vaccine  You may need this if you have certain conditions or if you travel or work in places where you may be exposed to hepatitis A. Hepatitis B vaccine  You may need this if you have certain conditions or if you travel or work in places where you may be exposed to hepatitis B. Haemophilus influenzae type b (Hib) vaccine  You may need this if you have certain conditions. You may receive vaccines as individual doses or as more than one vaccine together in one shot (combination vaccines). Talk with your health care provider about the risks and benefits of combination  vaccines. What tests do I need? Blood tests  Lipid and cholesterol levels. These may be checked every 5 years, or more frequently depending on your overall health.  Hepatitis C test.  Hepatitis B test. Screening  Lung cancer screening. You may have this screening every year starting at age 12 if you have a 30-pack-year history of smoking and currently smoke or have quit within the past 15 years.  Colorectal cancer screening. All  adults should have this screening starting at age 56 and continuing until age 70. Your health care provider may recommend screening at age 39 if you are at increased risk. You will have tests every 1-10 years, depending on your results and the type of screening test.  Diabetes screening. This is done by checking your blood sugar (glucose) after you have not eaten for a while (fasting). You may have this done every 1-3 years.  Mammogram. This may be done every 1-2 years. Talk with your health care provider about how often you should have regular mammograms.  BRCA-related cancer screening. This may be done if you have a family history of breast, ovarian, tubal, or peritoneal cancers. Other tests  Sexually transmitted disease (STD) testing.  Bone density scan. This is done to screen for osteoporosis. You may have this done starting at age 53. Follow these instructions at home: Eating and drinking  Eat a diet that includes fresh fruits and vegetables, whole grains, lean protein, and low-fat dairy products. Limit your intake of foods with high amounts of sugar, saturated fats, and salt.  Take vitamin and mineral supplements as recommended by your health care provider.  Do not drink alcohol if your health care provider tells you not to drink.  If you drink alcohol: ? Limit how much you have to 0-1 drink a day. ? Be aware of how much alcohol is in your drink. In the [place] one drink equals one 12 oz bottle of beer (355 mL), one 5 oz glass of wine (148 mL), or one 1 oz glass of hard liquor (44 mL). Lifestyle  Take daily care of your teeth and gums.  Stay active. Exercise for at least 30 minutes on 5 or more days each week.  Do not use any products that contain nicotine or tobacco, such as cigarettes, e-cigarettes, and chewing tobacco. If you need help quitting, ask your health care provider.  If you are sexually active, practice safe sex. Use a condom or other form of protection in order to  prevent STIs (sexually transmitted infections).  Talk with your health care provider about taking a low-dose aspirin or statin. What's next?  Go to your health care provider once a year for a well check visit.  Ask your health care provider how often you should have your eyes and teeth checked.  Stay up to date on all vaccines. This information is not intended to replace advice given to you by your health care provider. Make sure you discuss any questions you have with your health care provider. Document Revised: 11/20/2018 Document Reviewed: 11/20/2018 Elsevier Patient Education  2020 [employer].

## 2020-04-21 ENCOUNTER — Other Ambulatory Visit: Payer: PPO

## 2020-04-21 DIAGNOSIS — E559 Vitamin D deficiency, unspecified: Secondary | ICD-10-CM | POA: Diagnosis not present

## 2020-04-21 DIAGNOSIS — E890 Postprocedural hypothyroidism: Secondary | ICD-10-CM

## 2020-04-21 DIAGNOSIS — Z0000 Encounter for general adult medical examination without abnormal findings: Secondary | ICD-10-CM | POA: Diagnosis not present

## 2020-04-21 DIAGNOSIS — R7303 Prediabetes: Secondary | ICD-10-CM | POA: Diagnosis not present

## 2020-04-21 DIAGNOSIS — I110 Hypertensive heart disease with heart failure: Secondary | ICD-10-CM

## 2020-04-21 DIAGNOSIS — E7800 Pure hypercholesterolemia, unspecified: Secondary | ICD-10-CM | POA: Diagnosis not present

## 2020-04-21 DIAGNOSIS — M1A09X0 Idiopathic chronic gout, multiple sites, without tophus (tophi): Secondary | ICD-10-CM | POA: Diagnosis not present

## 2020-04-21 DIAGNOSIS — I5043 Acute on chronic combined systolic (congestive) and diastolic (congestive) heart failure: Secondary | ICD-10-CM

## 2020-04-22 ENCOUNTER — Other Ambulatory Visit: Payer: Self-pay | Admitting: Internal Medicine

## 2020-04-22 LAB — CBC WITH DIFFERENTIAL/PLATELET
Basophils Absolute: 0 10*3/uL (ref 0.0–0.2)
Basos: 0 %
EOS (ABSOLUTE): 0 10*3/uL (ref 0.0–0.4)
Eos: 0 %
Hematocrit: 42.5 % (ref 34.0–46.6)
Hemoglobin: 13.7 g/dL (ref 11.1–15.9)
Immature Grans (Abs): 0.1 10*3/uL (ref 0.0–0.1)
Immature Granulocytes: 1 %
Lymphocytes Absolute: 1.1 10*3/uL (ref 0.7–3.1)
Lymphs: 16 %
MCH: 29.6 pg (ref 26.6–33.0)
MCHC: 32.2 g/dL (ref 31.5–35.7)
MCV: 92 fL (ref 79–97)
Monocytes Absolute: 0.5 10*3/uL (ref 0.1–0.9)
Monocytes: 7 %
Neutrophils Absolute: 5.2 10*3/uL (ref 1.4–7.0)
Neutrophils: 76 %
Platelets: 260 10*3/uL (ref 150–450)
RBC: 4.63 x10E6/uL (ref 3.77–5.28)
RDW: 15.9 % — ABNORMAL HIGH (ref 11.7–15.4)
WBC: 6.9 10*3/uL (ref 3.4–10.8)

## 2020-04-22 LAB — COMPREHENSIVE METABOLIC PANEL
ALT: 23 IU/L (ref 0–32)
AST: 16 IU/L (ref 0–40)
Albumin/Globulin Ratio: 1.5 (ref 1.2–2.2)
Albumin: 4.5 g/dL (ref 3.7–4.7)
Alkaline Phosphatase: 95 IU/L (ref 39–117)
BUN/Creatinine Ratio: 22 (ref 12–28)
BUN: 24 mg/dL (ref 8–27)
Bilirubin Total: 0.4 mg/dL (ref 0.0–1.2)
CO2: 22 mmol/L (ref 20–29)
Calcium: 9.3 mg/dL (ref 8.7–10.3)
Chloride: 104 mmol/L (ref 96–106)
Creatinine, Ser: 1.07 mg/dL — ABNORMAL HIGH (ref 0.57–1.00)
GFR calc Af Amer: 60 mL/min/{1.73_m2} (ref >59)
GFR calc non Af Amer: 52 mL/min/{1.73_m2} — ABNORMAL LOW (ref >59)
Globulin, Total: 3.1 g/dL (ref 1.5–4.5)
Glucose: 102 mg/dL — ABNORMAL HIGH (ref 65–99)
Potassium: 4.2 mmol/L (ref 3.5–5.2)
Sodium: 141 mmol/L (ref 134–144)
Total Protein: 7.6 g/dL (ref 6.0–8.5)

## 2020-04-22 LAB — LIPID PANEL
Chol/HDL Ratio: 3.3 ratio (ref 0.0–4.4)
Cholesterol, Total: 169 mg/dL (ref 100–199)
HDL: 52 mg/dL (ref >39)
LDL Chol Calc (NIH): 102 mg/dL — ABNORMAL HIGH (ref 0–99)
Triglycerides: 82 mg/dL (ref 0–149)
VLDL Cholesterol Cal: 15 mg/dL (ref 5–40)

## 2020-04-22 LAB — T4, FREE: Free T4: 1.73 ng/dL (ref 0.82–1.77)

## 2020-04-22 LAB — VITAMIN D 25 HYDROXY (VIT D DEFICIENCY, FRACTURES): Vit D, 25-Hydroxy: 53.7 ng/mL (ref 30.0–100.0)

## 2020-04-22 LAB — URIC ACID: Uric Acid: 3.5 mg/dL (ref 3.1–7.9)

## 2020-04-22 LAB — TSH: TSH: 0.282 u[IU]/mL — ABNORMAL LOW (ref 0.450–4.500)

## 2020-04-22 LAB — HEMOGLOBIN A1C
Est. average glucose Bld gHb Est-mCnc: 123 mg/dL
Hgb A1c MFr Bld: 5.9 % — ABNORMAL HIGH (ref 4.8–5.6)

## 2020-04-22 MED ORDER — LEVOTHYROXINE SODIUM 100 MCG PO TABS
100.0000 ug | ORAL_TABLET | Freq: Every day | ORAL | 0 refills | Status: DC
Start: 2020-04-22 — End: 2020-05-26

## 2020-04-22 NOTE — Progress Notes (Signed)
Her blood sugars are still in prediabetic range with a hemoglobin A1c of 5.9%. Her LDL is higher than we would like.  Asked her to pay close attention to her diet and limit fatty foods and foods high in cholesterol.  Increasing her physical activity can also help lower her cholesterol.  She definitely needs to continue on her statin. Her TSH is low which means she is getting too much thyroid medication right now.  Please send in a lower dose of thyroid 100 mcg for her to start taking and stop the 112 mcg tablets.  Have her return in 4 weeks to see me and we will recheck her thyroid function

## 2020-04-28 ENCOUNTER — Other Ambulatory Visit: Payer: Self-pay | Admitting: Cardiovascular Disease

## 2020-05-02 ENCOUNTER — Other Ambulatory Visit: Payer: Self-pay

## 2020-05-02 ENCOUNTER — Ambulatory Visit: Payer: PPO | Admitting: Cardiology

## 2020-05-02 ENCOUNTER — Encounter: Payer: Self-pay | Admitting: Cardiology

## 2020-05-02 VITALS — BP 104/70 | HR 85 | Temp 97.8°F | Ht 65.0 in | Wt 213.2 lb

## 2020-05-02 DIAGNOSIS — E785 Hyperlipidemia, unspecified: Secondary | ICD-10-CM | POA: Diagnosis not present

## 2020-05-02 DIAGNOSIS — Z7901 Long term (current) use of anticoagulants: Secondary | ICD-10-CM

## 2020-05-02 DIAGNOSIS — Z9989 Dependence on other enabling machines and devices: Secondary | ICD-10-CM

## 2020-05-02 DIAGNOSIS — E0580 Other thyrotoxicosis without thyrotoxic crisis or storm: Secondary | ICD-10-CM

## 2020-05-02 DIAGNOSIS — I428 Other cardiomyopathies: Secondary | ICD-10-CM | POA: Diagnosis not present

## 2020-05-02 DIAGNOSIS — I2510 Atherosclerotic heart disease of native coronary artery without angina pectoris: Secondary | ICD-10-CM

## 2020-05-02 DIAGNOSIS — G4733 Obstructive sleep apnea (adult) (pediatric): Secondary | ICD-10-CM | POA: Diagnosis not present

## 2020-05-02 DIAGNOSIS — I4819 Other persistent atrial fibrillation: Secondary | ICD-10-CM | POA: Diagnosis not present

## 2020-05-02 DIAGNOSIS — I10 Essential (primary) hypertension: Secondary | ICD-10-CM | POA: Diagnosis not present

## 2020-05-02 DIAGNOSIS — Z9861 Coronary angioplasty status: Secondary | ICD-10-CM | POA: Diagnosis not present

## 2020-05-02 MED ORDER — HYDRALAZINE HCL 10 MG PO TABS: 10.0000 mg | ORAL_TABLET | Freq: Two times a day (BID) | ORAL | 3 refills | Status: DC

## 2020-05-02 MED ORDER — ISOSORBIDE DINITRATE 5 MG PO TABS: 5.0000 mg | ORAL_TABLET | Freq: Two times a day (BID) | ORAL | 3 refills | Status: DC

## 2020-05-02 NOTE — Patient Instructions (Signed)
Medication Instructions:  DECREASE YOUR HYDRALAZINE TO 10MG  TWICE A DAY  DECREASE YOUR ISORDIL TO 5MG  TWICE A DAY  *If you need a refill on your cardiac medications before your next appointment, please call your pharmacy*   Follow-Up: At [facility], you and your health needs are our priority.  As part of our continuing mission to provide you with exceptional heart care, we have created designated Provider Care Teams.  These Care Teams include your primary Cardiologist (physician) and Advanced Practice Providers (APPs -  Physician Assistants and Nurse Practitioners) who all work together to provide you with the care you need, when you need it.  We recommend signing up for the patient portal called "MyChart".  Sign up information is provided on this After Visit Summary.  MyChart is used to connect with patients for Virtual Visits (Telemedicine).  Patients are able to view lab/test results, encounter notes, upcoming appointments, etc.  Non-urgent messages can be sent to your provider as well.   To learn more about what you can do with MyChart, go to [email or web address].    Your next appointment:   4 week(s)  The format for your next appointment:   In Person  Provider:   Kerin Ransom

## 2020-05-02 NOTE — Progress Notes (Signed)
Cardiology Office Note:    Date:  05/02/2020   ID:  Claudia Cantrell, DOB 07/30/1946, MRN [id number]  PCP:  Girtha Rm, NP-C  Cardiologist:  Shelva Majestic, MD  Electrophysiologist:  None   Referring MD: Girtha Rm, NP-C   No chief complaint on file.   History of Present Illness:    Claudia Cantrell is a pleasant 74 y.o. female with a hx of CAD, S/P remote PCI in the 90's, OSA-on C-pap, HTN, and PAFs/p DCCV Aug 2017.  She is on Eliquis. The patient had an endoscopy 09/02/2019. She was going to have a polyp biopsied but the procedure was aborted secondary to SOB and HTN. She was to follow up with Dr Claiborne Billings but presented to the ED first on 09/21/2019 with chest pain, CHF, HTN, and AF. She ruled out for an MI. She was diuresed and her medications were adjusted for HTN and AF. Myoview showed scar with some superimposed ischemia. Echo showed an EF of 45-50% with moderate LVH and moderately dilated LA.  It was decided not to proceed with coronary angiogram at that time.  The patient was discharged 09/23/2019.At discharge it was decided to [facility] her [facility] work up then resume anticoagulation and plan on DCCV 4 weeks after Eliquis resumed. 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  She eventually underwent OP DCCV 01/11/2020.   After her DCCV she feelt like she had more energy. Overall she does note an improvement.  Her EKG when I saw her in follow up 02/01/2020 showed NSR, SB 48.  At that time I reduced her Coreg to 25 mg in AM, 12.5 mg in PM.  She returns today for follow up. She is noted to be back in AF with VR 84.  She has noticed some increased DOE in the last 1-2 weeks and thought she might be back in AF.  She saw her PCP 04/21/2020 and labs were obtained.  Her TSH is low-0.282 with a free T4 of 1.73.  Her Synthroid was reduced and the patient has a f/u in a few weeks to have this rechecked.  Besides being in AF she has no other  complaints no LE edema, no chest pain.  She has noticed her B/P has been low and in fact had not taken her medications today before this visit (B/P 112/70 by me).    Past Medical History:  Diagnosis Date  . Arthritis    rheumatoid  . CAD (coronary artery disease)    a. 1992 s/p MI and PTCA of unknown vessel;  b. 09/2010 Cath: LM nl, LAD 20p, LCX 12m, RCA 21m, RPL 20.  Marland Kitchen Cervical cancer ([place]) 1977  . Family history of anesthesia complication    daughter has difficulty waking   . GERD (gastroesophageal reflux disease)    occ  . Gout 10/08/2016  . Hyperlipidemia   . Hypertensive heart disease   . Hypothyroidism   . Nonischemic cardiomyopathy ([place])    a. 07/2016 Echo: EF 40-45%, mild LVH, inferior akinesis, moderately dilated left atrium, trivial AI and MR.  Marland Kitchen PAF (paroxysmal atrial fibrillation) ([place])    a. 07/2016 Admitted w/ AF RVR-->CHA2DS2VASc = 5-->Eliquis;  b. 07/2016 successful TEE/DCCV.  Marland Kitchen Sleep apnea    a. Using CPAP.    Past Surgical History:  Procedure Laterality Date  . ABDOMINAL HYSTERECTOMY  1988  . BACK SURGERY  1994  . BIOPSY  09/02/2019   Procedure: BIOPSY;  Surgeon: Irving Copas., MD;  Location: [facility];  Service: Gastroenterology;;  . BIOPSY  12/09/2019   Procedure: BIOPSY;  Surgeon: Irving Copas., MD;  Location: [facility];  Service: Gastroenterology;;  . [place]   PTCA BY DR Lamount Cohen  . CARDIOVERSION N/A 08/01/2016   Procedure: CARDIOVERSION;  Surgeon: Lelon Perla, MD;  Location: [facility] ENDOSCOPY;  Service: Cardiovascular;  Laterality: N/A;  . CARDIOVERSION N/A 01/11/2020   Procedure: CARDIOVERSION;  Surgeon: Josue Hector, MD;  Location: [place];  Service: Cardiovascular;  Laterality: N/A;  . ENDOSCOPIC MUCOSAL RESECTION N/A 12/09/2019   Procedure: ENDOSCOPIC MUCOSAL RESECTION;  Surgeon: Rush Landmark Telford Nab., MD;  Location: [facility];  Service: Gastroenterology;  Laterality: N/A;  .  ESOPHAGOGASTRODUODENOSCOPY (EGD) WITH PROPOFOL N/A 09/02/2019   Procedure: ESOPHAGOGASTRODUODENOSCOPY (EGD) WITH PROPOFOL;  Surgeon: Rush Landmark Telford Nab., MD;  Location: [facility];  Service: Gastroenterology;  Laterality: N/A;  . ESOPHAGOGASTRODUODENOSCOPY (EGD) WITH PROPOFOL N/A 12/09/2019   Procedure: ESOPHAGOGASTRODUODENOSCOPY (EGD) WITH PROPOFOL;  Surgeon: Rush Landmark Telford Nab., MD;  Location: [facility];  Service: Gastroenterology;  Laterality: N/A;  . EUS N/A 09/02/2019   Procedure: UPPER ENDOSCOPIC ULTRASOUND (EUS) RADIAL;  Surgeon: Rush Landmark Telford Nab., MD;  Location: [facility];  Service: Gastroenterology;  Laterality: N/A;  EUS radial/linear  . EUS N/A 12/09/2019   Procedure: UPPER ENDOSCOPIC ULTRASOUND (EUS) RADIAL;  Surgeon: Irving Copas., MD;  Location: [facility];  Service: Gastroenterology;  Laterality: N/A;  . EUS  12/09/2019   Procedure: UPPER ENDOSCOPIC ULTRASOUND (EUS) LINEAR;  Surgeon: Irving Copas., MD;  Location: [facility];  Service: Gastroenterology;;  . FINE NEEDLE ASPIRATION  09/02/2019   Procedure: FINE NEEDLE ASPIRATION (FNA) LINEAR;  Surgeon: Irving Copas., MD;  Location: [facility];  Service: Gastroenterology;;  . FINE NEEDLE ASPIRATION  12/09/2019   Procedure: FINE NEEDLE ASPIRATION (FNA) LINEAR;  Surgeon: Irving Copas., MD;  Location: [facility];  Service: Gastroenterology;;  . HEMOSTASIS CLIP PLACEMENT  12/09/2019   Procedure: HEMOSTASIS CLIP PLACEMENT;  Surgeon: Irving Copas., MD;  Location: [facility];  Service: Gastroenterology;;  . ORIF ANKLE FRACTURE Right 04/27/2015   Procedure: OPEN REDUCTION INTERNAL FIXATION (ORIF) RIGHT BIMALLEOLAR ANKLE FRACTURE WITH SYNDESMOSIS FIXATION;  Surgeon: Leandrew Koyanagi, MD;  Location: [place];  Service: Orthopedics;  Laterality: Right;  . SUBMUCOSAL LIFTING INJECTION  12/09/2019   Procedure: SUBMUCOSAL LIFTING INJECTION;  Surgeon: Rush Landmark Telford Nab., MD;   Location: [facility];  Service: Gastroenterology;;  . TEE WITHOUT CARDIOVERSION N/A 08/01/2016   Procedure: TRANSESOPHAGEAL ECHOCARDIOGRAM (TEE);  Surgeon: Lelon Perla, MD;  Location: [facility] ENDOSCOPY;  Service: Cardiovascular;  Laterality: N/A;  . THYROIDECTOMY  11/24/2013   DR Dalbert Batman  . THYROIDECTOMY N/A 11/24/2013   Procedure: TOTAL THYROIDECTOMY;  Surgeon: Adin Hector, MD;  Location: [place];  Service: General;  Laterality: N/A;  . TRANSTHORACIC ECHOCARDIOGRAM  01/15/2013   EF 55% TO 65%. PROBABLE MILD HYPOKINESIS OF THE INFERIOR MYOCARDIUM. GRADE 1 DIASTOLIC DYSFUNCTION. TRIAL AR.LA IS MILDLY DILATED.    Current Medications: Current Meds  Medication Sig  . acetaminophen (TYLENOL) 650 MG CR tablet Take 650 mg by mouth every 8 (eight) hours as needed for pain.   Marland Kitchen allopurinol (ZYLOPRIM) 100 MG tablet Take 100 mg by mouth daily.  Marland Kitchen amLODipine (NORVASC) 10 MG tablet Take 10 mg by mouth daily at 3 pm.   . apixaban (ELIQUIS) 5 MG TABS tablet Take 1 tablet (5 mg total) by mouth 2 (two) times daily.  Marland Kitchen  carvedilol (COREG) 25 MG tablet Take 1 tablet (25 mg) by mouth in the morning and 1/2 tablet (12.5 mg) in the evening.  . Cholecalciferol (DIALYVITE VITAMIN D 5000) 125 MCG (5000 UT) capsule Take 5,000 Units by mouth daily.  . hydrALAZINE (APRESOLINE) 10 MG tablet Take 1 tablet (10 mg total) by mouth 2 (two) times daily.  . hydrochlorothiazide (MICROZIDE) 12.5 MG capsule Take 1 capsule (12.5 mg total) by mouth daily.  . isosorbide dinitrate (ISORDIL) 5 MG tablet Take 1 tablet (5 mg total) by mouth 2 (two) times daily.  Marland Kitchen levothyroxine (SYNTHROID) 100 MCG tablet Take 1 tablet (100 mcg total) by mouth daily.  . Magnesium 400 MG CAPS Take 400 mg by mouth daily.  Marland Kitchen olmesartan (BENICAR) 40 MG tablet Take 40 mg by mouth daily.   Marland Kitchen omeprazole (PRILOSEC) 40 MG capsule Take 1 capsule (40 mg total) by mouth 2 (two) times daily before a meal. Take 30 minutes before breakfast and dinner for 1 month.   Then may decrease to 40 mg once daily before breakfast or before dinner and maintain. (Patient taking differently: Take 40 mg by mouth 2 (two) times daily before a meal. )  . Probiotic Product (PROBIOTIC DAILY PO) Take 1 tablet by mouth daily.  . rosuvastatin (CRESTOR) 40 MG tablet Take 1 tablet by mouth once daily  . [DISCONTINUED] hydrALAZINE (APRESOLINE) 10 MG tablet Take 1 tablet (10 mg total) by mouth 3 (three) times daily.  . [DISCONTINUED] isosorbide dinitrate (ISORDIL) 5 MG tablet Take 1 tablet (5 mg total) by mouth 3 (three) times daily.     Allergies:   Labetalol and Codeine   Social History   Socioeconomic History  . Marital status: Widowed    Spouse name: Not on file  . Number of children: 3  . Years of education: Not on file  . Highest education level: Not on file  Occupational History  . Occupation: retired  Tobacco Use  . Smoking status: Former Smoker    Packs/day: 1.00    Years: 15.00    Pack years: 15.00    Types: Cigarettes    Quit date: 12/10/1990    Years since quitting: 29.4  . Smokeless tobacco: Never Used  . Tobacco comment: occ alcohol  Substance and Sexual Activity  . Alcohol use: Yes    Comment: very rare  . Drug use: No  . Sexual activity: Not on file  Other Topics Concern  . Not on file  Social History Narrative  . Not on file   Social Determinants of Health   Financial Resource Strain:   . Difficulty of Paying Living Expenses:   Food Insecurity:   . Worried About Charity fundraiser in the Last Year:   . Arboriculturist in the Last Year:   Transportation Needs:   . Film/video editor (Medical):   Marland Kitchen Lack of Transportation (Non-Medical):   Physical Activity:   . Days of Exercise per Week:   . Minutes of Exercise per Session:   Stress:   . Feeling of Stress :   Social Connections:   . Frequency of Communication with Friends and Family:   . Frequency of Social Gatherings with Friends and Family:   . Attends Religious Services:   .  Active Member of Clubs or Organizations:   . Attends Archivist Meetings:   Marland Kitchen Marital Status:      Family History: The patient's family history includes Bone cancer in her sister; Diabetes in  her father; Heart disease in her brother and mother; Melanoma in her brother; Stroke in her father; Sudden death in her brother and mother. There is no history of Colon cancer, Esophageal cancer, Inflammatory bowel disease, Liver disease, Pancreatic cancer, Rectal cancer, or Stomach cancer.  ROS:   Please see the history of present illness.   Recent rt shoulder injection by Dr Estanislado Pandy   All other systems reviewed and are negative.  EKGs/Labs/Other Studies Reviewed:    The following studies were reviewed today: Echo Oct 2020-  EKG:  EKG is ordered today.  The ekg ordered today demonstrates AF with VR-83, low volts  Recent Labs: 09/21/2019: B Natriuretic Peptide 858.8 09/23/2019: Magnesium 2.1 04/21/2020: ALT 23; BUN 24; Creatinine, Ser 1.07; Hemoglobin 13.7; Platelets 260; Potassium 4.2; Sodium 141; TSH 0.282  Recent Lipid Panel    Component Value Date/Time   CHOL 169 04/21/2020 0926   TRIG 82 04/21/2020 0926   HDL 52 04/21/2020 0926   CHOLHDL 3.3 04/21/2020 0926   CHOLHDL 3.4 09/22/2019 0226   VLDL 19 09/22/2019 0226   LDLCALC 102 (H) 04/21/2020 0926    Physical Exam:    VS:  BP 104/70   Pulse 85   Temp 97.8 F (36.6 C)   Ht 5\' 5"  (1.651 m)   Wt 213 lb 3.2 oz (96.7 kg)   SpO2 98%   BMI 35.48 kg/m     Wt Readings from Last 3 Encounters:  05/02/20 213 lb 3.2 oz (96.7 kg)  04/20/20 219 lb 6.4 oz (99.5 kg)  04/15/20 219 lb 12.8 oz (99.7 kg)     GEN:  Overweight AA female in no acute distress HEENT: Normal NECK: No JVD; No carotid bruits CARDIAC: irregularly irregular rhythm , no murmurs, rubs, gallops RESPIRATORY:  Clear to auscultation without rales, wheezing or rhonchi  ABDOMEN: Soft, non-tender, non-distended MUSCULOSKELETAL:  No edema; No deformity  SKIN:  Warm and dry NEUROLOGIC:  Alert and oriented x 3 PSYCHIATRIC:  Normal affect   ASSESSMENT:    Persistent atrial fibrillation ([place]) 07/2016 Admitted w/ AF RVR-  successful TEE/DCCV. 09/21/2019- recurrent AF- eventual OP DCCV Feb 2021 with return to NSR and improvement in her symptoms. Back in AF today with mild symptoms (DOE).   Iatrogenic hyperthyroidism TSH low, free T4 WNL. Synthroid adjusted- f/u scheduled.  It's possible this has contributed to her recurrent PAF.  We will see her in follow and consider repeat DCCV once her TSH is normalized.   Essential hypertension Echo Oct 2020 shows EF 45-50% with moderate LVH B/P actually a little low- will reduce Hydralazine and Isordil  Chroniccombined systolic and diastolic CHF, NYHA class 3 ([place]) Admitted 09/21/2019-currently stable  CAD S/P percutaneous coronary angioplasty 1992 s/p MI and PTCA of unknown vessel (Dr Sherald Barge)  09/2010 Cath: LM nl, LAD 20p, LCX 41m, RCA 53m, RPL 20. Myoview 09/22/2019- scar with peri-infarct ischemia- medical rx  Nonischemic cardiomyopathy [facility]) 2D Oct 2020- EF 45-50% with moderate LVH  OSA on CPAP She reports compliance with C-pap  Chronic anticoagulation- Eliquis resumed 12/12/2019  Stromal tumor- Incidental finding- Dr Learta Codding following.  Overweight- BMI 35- weight loss diet and exercise discussed.  PLAN:    Reduce hydralazine to 10 mg BID and Isordil to 5 mg BID- consider stopping both if her B/P remains stable.  F/U in 4-6 weeks to consider repeat DCCV once her TSH is WNL.    Medication Adjustments/Labs and Tests Ordered: Current medicines are reviewed at length with the patient today.  Concerns regarding  medicines are outlined above.  No orders of the defined types were placed in this encounter.  Meds ordered this encounter  Medications  . hydrALAZINE (APRESOLINE) 10 MG tablet    Sig: Take 1 tablet (10 mg total) by mouth 2 (two) times daily.    Dispense:  180 tablet     Refill:  3  . isosorbide dinitrate (ISORDIL) 5 MG tablet    Sig: Take 1 tablet (5 mg total) by mouth 2 (two) times daily.    Dispense:  180 tablet    Refill:  3    Patient Instructions  Medication Instructions:  DECREASE YOUR HYDRALAZINE TO 10MG  TWICE A DAY  DECREASE YOUR ISORDIL TO 5MG  TWICE A DAY  *If you need a refill on your cardiac medications before your next appointment, please call your pharmacy*   Follow-Up: At [facility], you and your health needs are our priority.  As part of our continuing mission to provide you with exceptional heart care, we have created designated Provider Care Teams.  These Care Teams include your primary Cardiologist (physician) and Advanced Practice Providers (APPs -  Physician Assistants and Nurse Practitioners) who all work together to provide you with the care you need, when you need it.  We recommend signing up for the patient portal called "MyChart".  Sign up information is provided on this After Visit Summary.  MyChart is used to connect with patients for Virtual Visits (Telemedicine).  Patients are able to view lab/test results, encounter notes, upcoming appointments, etc.  Non-urgent messages can be sent to your provider as well.   To learn more about what you can do with MyChart, go to [email or web address].    Your next appointment:   4 week(s)  The format for your next appointment:   In Person  Provider:   Kerin Ransom        Signed, Kerin Ransom, PA-C  05/02/2020 10:43 AM    [place]

## 2020-05-02 NOTE — Assessment & Plan Note (Signed)
Echo Oct 2020 shows EF 45-50% with moderate LVH B/P actually a little low- will reduce Hydralazine and Isordil

## 2020-05-02 NOTE — Assessment & Plan Note (Signed)
07/2016 Admitted w/ AF RVR-  successful TEE/DCCV. 09/21/2019- recurrent AF- eventual OP DCCV Feb 2021 with return to NSR and improvement in her symptoms. Back in AF today with mild symptoms (DOE).

## 2020-05-02 NOTE — Assessment & Plan Note (Signed)
TSH low, free T4 WNL. Synthroid adjusted- f/u scheduled.  It's possible this has contributed to her recurrent PAF.  We will see her in follow and consider repeat DCCV once her TSH is normalized.

## 2020-05-02 NOTE — Addendum Note (Signed)
Addended by: Valeta Harms on: 05/02/2020 12:20 PM   Modules accepted: Orders

## 2020-05-04 DIAGNOSIS — K319 Disease of stomach and duodenum, unspecified: Secondary | ICD-10-CM | POA: Diagnosis not present

## 2020-05-04 DIAGNOSIS — I129 Hypertensive chronic kidney disease with stage 1 through stage 4 chronic kidney disease, or unspecified chronic kidney disease: Secondary | ICD-10-CM | POA: Diagnosis not present

## 2020-05-04 DIAGNOSIS — M109 Gout, unspecified: Secondary | ICD-10-CM | POA: Diagnosis not present

## 2020-05-04 DIAGNOSIS — E559 Vitamin D deficiency, unspecified: Secondary | ICD-10-CM | POA: Diagnosis not present

## 2020-05-04 DIAGNOSIS — N183 Chronic kidney disease, stage 3 unspecified: Secondary | ICD-10-CM | POA: Diagnosis not present

## 2020-05-04 DIAGNOSIS — R809 Proteinuria, unspecified: Secondary | ICD-10-CM | POA: Diagnosis not present

## 2020-05-04 DIAGNOSIS — I480 Paroxysmal atrial fibrillation: Secondary | ICD-10-CM | POA: Diagnosis not present

## 2020-05-20 DIAGNOSIS — I129 Hypertensive chronic kidney disease with stage 1 through stage 4 chronic kidney disease, or unspecified chronic kidney disease: Secondary | ICD-10-CM | POA: Diagnosis not present

## 2020-05-23 ENCOUNTER — Ambulatory Visit: Payer: PPO | Admitting: Family Medicine

## 2020-05-25 ENCOUNTER — Other Ambulatory Visit: Payer: Self-pay | Admitting: Family Medicine

## 2020-05-27 ENCOUNTER — Other Ambulatory Visit: Payer: Self-pay

## 2020-05-27 MED ORDER — CARVEDILOL 25 MG PO TABS: ORAL_TABLET | ORAL | 3 refills | Status: DC

## 2020-05-30 ENCOUNTER — Other Ambulatory Visit: Payer: Self-pay

## 2020-05-30 ENCOUNTER — Ambulatory Visit: Payer: PPO | Admitting: Cardiology

## 2020-05-30 ENCOUNTER — Encounter: Payer: Self-pay | Admitting: Family Medicine

## 2020-05-30 ENCOUNTER — Ambulatory Visit (INDEPENDENT_AMBULATORY_CARE_PROVIDER_SITE_OTHER): Payer: PPO | Admitting: Family Medicine

## 2020-05-30 VITALS — BP 120/70 | HR 90 | Wt 209.8 lb

## 2020-05-30 DIAGNOSIS — Z79899 Other long term (current) drug therapy: Secondary | ICD-10-CM

## 2020-05-30 DIAGNOSIS — R7989 Other specified abnormal findings of blood chemistry: Secondary | ICD-10-CM

## 2020-05-30 DIAGNOSIS — I4819 Other persistent atrial fibrillation: Secondary | ICD-10-CM

## 2020-05-30 DIAGNOSIS — E890 Postprocedural hypothyroidism: Secondary | ICD-10-CM | POA: Diagnosis not present

## 2020-05-30 NOTE — Patient Instructions (Signed)
I will see you back in 3 months or sooner if needed.

## 2020-05-30 NOTE — Progress Notes (Signed)
° °  Subjective:    Patient ID: Claudia Cantrell, female    DOB: May 31, 1946, 74 y.o.   MRN: [id number]  HPI Chief Complaint  Patient presents with   4 week follow-up    4 week follow-up on thyorid   She is here for a 4 week follow up on low TSH. We reduced her Synthroid dose. Reports doing fine.   States she has stopped eating potatoes and bread and has lost some weight.   She saw her kidney specialist last week. He stopped her amlodipine due to low BP.  BP now has been in the normal range.   States she has an appointment tomorrow with her cardiologist.   Denies fever, chills, dizziness, chest pain, shortness of breath, N/V/D or LE edema.   Review of Systems Pertinent positives and negatives in the history of present illness.     Objective:   Physical Exam  BP 120/70    Pulse 90    Wt 209 lb 12.8 oz (95.2 kg)    SpO2 97%    BMI 34.91 kg/m   Alert and in no distress. Neck is supple without adenopathy. Cardiac exam shows an irregular rhythm. Lungs are clear to auscultation. Extremities without edema. Skin is warm and dry.       Assessment & Plan:  Low TSH level - Plan: TSH, T4, free  Postsurgical hypothyroidism - Plan: TSH, T4, free  Medication management  Persistent atrial fibrillation (HCC)  We reduced her Synthroid dose 4 weeks ago. Follow up pending labs.  She will follow up as schedule with her cardiologist tomorrow.  Handicap placard application filled out

## 2020-05-31 LAB — TSH: TSH: 0.297 u[IU]/mL — ABNORMAL LOW (ref 0.450–4.500)

## 2020-05-31 LAB — T4, FREE: Free T4: 2.04 ng/dL — ABNORMAL HIGH (ref 0.82–1.77)

## 2020-06-01 ENCOUNTER — Other Ambulatory Visit: Payer: Self-pay | Admitting: Internal Medicine

## 2020-06-01 DIAGNOSIS — R7989 Other specified abnormal findings of blood chemistry: Secondary | ICD-10-CM

## 2020-06-01 MED ORDER — LEVOTHYROXINE SODIUM 88 MCG PO TABS
88.0000 ug | ORAL_TABLET | Freq: Every day | ORAL | 0 refills | Status: DC
Start: 2020-06-01 — End: 2020-08-18

## 2020-06-01 NOTE — Progress Notes (Signed)
Her thyroid function is still too low, meaning too much medication. Has she been taking the 100 mcg dose of Synthroid? If so, we should reduce her Synthroid dose once again to 88 mcg and have her follow up in 4 weeks for TSH recheck. If she has not, then take the 100 mcg and follow up in 4 weeks.

## 2020-06-07 ENCOUNTER — Telehealth: Payer: Self-pay | Admitting: Family Medicine

## 2020-06-07 NOTE — Telephone Encounter (Signed)
Requested records received from [facility]  

## 2020-06-24 ENCOUNTER — Encounter: Payer: Self-pay | Admitting: Cardiology

## 2020-06-24 ENCOUNTER — Ambulatory Visit: Payer: PPO | Admitting: Cardiology

## 2020-06-24 ENCOUNTER — Other Ambulatory Visit: Payer: Self-pay

## 2020-06-24 VITALS — BP 140/90 | HR 69 | Ht 65.0 in | Wt 213.0 lb

## 2020-06-24 DIAGNOSIS — Z9861 Coronary angioplasty status: Secondary | ICD-10-CM

## 2020-06-24 DIAGNOSIS — Z7901 Long term (current) use of anticoagulants: Secondary | ICD-10-CM | POA: Diagnosis not present

## 2020-06-24 DIAGNOSIS — E0580 Other thyrotoxicosis without thyrotoxic crisis or storm: Secondary | ICD-10-CM | POA: Diagnosis not present

## 2020-06-24 DIAGNOSIS — I428 Other cardiomyopathies: Secondary | ICD-10-CM

## 2020-06-24 DIAGNOSIS — D481 Neoplasm of uncertain behavior of connective and other soft tissue: Secondary | ICD-10-CM

## 2020-06-24 DIAGNOSIS — I2510 Atherosclerotic heart disease of native coronary artery without angina pectoris: Secondary | ICD-10-CM | POA: Diagnosis not present

## 2020-06-24 DIAGNOSIS — I10 Essential (primary) hypertension: Secondary | ICD-10-CM | POA: Diagnosis not present

## 2020-06-24 DIAGNOSIS — G4733 Obstructive sleep apnea (adult) (pediatric): Secondary | ICD-10-CM

## 2020-06-24 DIAGNOSIS — D4819 Other specified neoplasm of uncertain behavior of connective and other soft tissue: Secondary | ICD-10-CM

## 2020-06-24 DIAGNOSIS — I4819 Other persistent atrial fibrillation: Secondary | ICD-10-CM | POA: Diagnosis not present

## 2020-06-24 DIAGNOSIS — Z9989 Dependence on other enabling machines and devices: Secondary | ICD-10-CM

## 2020-06-24 NOTE — Assessment & Plan Note (Signed)
07/2016 Admitted w/ AF RVR-  successful TEE/DCCV then. 09/21/2019- recurrent AF- eventual OP DCCV Feb 2021 with return to NSR and improvement in her symptoms. Back in AF May 2021 and also hyperthyroid.  Her Synthroid dose has been adjusted by her PCP.  Plan is for repeat DCCV once she is euthyroid.

## 2020-06-24 NOTE — Patient Instructions (Signed)
Medication Instructions:  Your physician recommends that you continue on your current medications as directed. Please refer to the Current Medication list given to you today.  *If you need a refill on your cardiac medications before your next appointment, please call your pharmacy*   Lab Work: Your physician recommends that you return for lab work on Thursday, 06/30/20: BMET, CBC, TSH, and Free,T4. You do not need an appointment to have blood work done in our office. Please bring your lab slips with you.  If you have labs (blood work) drawn today and your tests are completely normal, you will receive your results only by:  [place] (if you have MyChart) OR  A paper copy in the mail If you have any lab test that is abnormal or we need to change your treatment, we will call you to review the results.  Follow-Up: At [facility], you and your health needs are our priority.  As part of our continuing mission to provide you with exceptional heart care, we have created designated Provider Care Teams.  These Care Teams include your primary Cardiologist (physician) and Advanced Practice Providers (APPs -  Physician Assistants and Nurse Practitioners) who all work together to provide you with the care you need, when you need it.  We recommend signing up for the patient portal called "MyChart".  Sign up information is provided on this After Visit Summary.  MyChart is used to connect with patients for Virtual Visits (Telemedicine).  Patients are able to view lab/test results, encounter notes, upcoming appointments, etc.  Non-urgent messages can be sent to your provider as well.   To learn more about what you can do with MyChart, go to [email or web address].    Your next appointment:   We will call you with your lab results about a follow-up appointment to discuss Cardioversion.

## 2020-06-24 NOTE — Assessment & Plan Note (Signed)
2D Oct 2020- EF 45-50% with moderate LVH  No CHF on exam

## 2020-06-24 NOTE — Assessment & Plan Note (Signed)
On C-pap 

## 2020-06-24 NOTE — Assessment & Plan Note (Signed)
Echo Oct 2020 shows EF 45-50% with moderate LVH. Repeat B/P by me 120/74

## 2020-06-24 NOTE — Assessment & Plan Note (Signed)
Plan is for surveillance. Dr Benay Spice following

## 2020-06-24 NOTE — H&P (View-Only) (Signed)
Cardiology Office Note:    Date:  06/24/2020   ID:  Claudia Cantrell, DOB Apr 28, 1946, MRN [id number]  PCP:  Girtha Rm, NP-C  Cardiologist:  Shelva Majestic, MD  Electrophysiologist:  None   Referring MD: Girtha Rm, NP-C   No chief complaint on file.   History of Present Illness:    Claudia Cantrell is a 74 y.o. female with a hx of CAD, S/P remote PCI in the 6's. Cath in 2011 showed normal coronaries.  She also has, OSA-on C-pap, HTN, and PAFs/p DCCV Aug 2017. She is on Eliquis. The patient had an endoscopy 09/02/2019. She was going to have a polyp biopsied but the procedure was aborted secondary to SOB and HTN. She was to follow up with Dr Claiborne Billings but presented to the ED first on 09/21/2019 with chest pain, CHF, HTN, and AF. She ruled out for an MI. She was diuresed and her medications were adjusted for HTN and AF. Myoview showed scar with some superimposed ischemia. Echo showed an EF of 45-50% with moderate LVH and moderately dilated LA. Itwas decided not to proceed with coronary angiogram at that time.  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 She eventually underwent OP DCCV 01/11/2020.   After her DCCV she feelt like she had more energy. Overall she does note an improvement. Her EKG when I saw her in follow up 02/01/2020. EKG showed NSR, SB 48.  At that time I reduced her Coreg to 25 mg in AM, 12.5 mg in PM.  She returned 05/02/2020 for follow up. She was noted to be back in AF with VR 84.  She had noticed some increased DOE the previous 1-2 weeks and suspected she might be back in AF.  She saw her PCP 04/21/2020 and labs were obtained.  Her TSH is low-0.282 with a free T4 of 1.73.  Her Synthroid has been reduced twice.  Besides being in AF she has no other  complaints no LE edema, no chest pain.  She does note increased DOE.  She feels like she can tell she is in AF.  Past Medical History:  Diagnosis Date  . Arthritis    rheumatoid  . CAD (coronary artery disease)    a. 1992 s/p MI and PTCA of unknown vessel;  b. 09/2010 Cath: LM nl, LAD 20p, LCX 32m, RCA 21m, RPL 20.  Marland Kitchen Cervical cancer ([place]) 1977  . Family history of anesthesia complication    daughter has difficulty waking   . GERD (gastroesophageal reflux disease)    occ  . Gout 10/08/2016  . Hyperlipidemia   . Hypertensive heart disease   . Hypothyroidism   . Nonischemic cardiomyopathy ([place])    a. 07/2016 Echo: EF 40-45%, mild LVH, inferior akinesis, moderately dilated left atrium, trivial AI and MR.  Marland Kitchen PAF (paroxysmal atrial fibrillation) ([place])    a. 07/2016 Admitted w/ AF RVR-->CHA2DS2VASc = 5-->Eliquis;  b. 07/2016 successful TEE/DCCV.  Marland Kitchen Sleep apnea    a. Using CPAP.    Past Surgical History:  Procedure Laterality Date  . ABDOMINAL HYSTERECTOMY  1988  . BACK SURGERY  1994  . BIOPSY  09/02/2019   Procedure: BIOPSY;  Surgeon: Irving Copas., MD;  Location: [facility];  Service: Gastroenterology;;  . BIOPSY  12/09/2019  Procedure: BIOPSY;  Surgeon: Irving Copas., MD;  Location: [facility];  Service: Gastroenterology;;  . [place]   PTCA BY DR Lamount Cohen  . CARDIOVERSION N/A 08/01/2016   Procedure: CARDIOVERSION;  Surgeon: Lelon Perla, MD;  Location: [facility] ENDOSCOPY;  Service: Cardiovascular;  Laterality: N/A;  . CARDIOVERSION N/A 01/11/2020   Procedure: CARDIOVERSION;  Surgeon: Josue Hector, MD;  Location: [place];  Service: Cardiovascular;  Laterality: N/A;  . ENDOSCOPIC MUCOSAL RESECTION N/A 12/09/2019   Procedure: ENDOSCOPIC MUCOSAL RESECTION;  Surgeon: Rush Landmark Telford Nab., MD;  Location: [facility] ENDOSCOPY;  Service: Gastroenterology;  Laterality: N/A;  . ESOPHAGOGASTRODUODENOSCOPY (EGD) WITH PROPOFOL N/A  09/02/2019   Procedure: ESOPHAGOGASTRODUODENOSCOPY (EGD) WITH PROPOFOL;  Surgeon: Rush Landmark Telford Nab., MD;  Location: [facility] ENDOSCOPY;  Service: Gastroenterology;  Laterality: N/A;  . ESOPHAGOGASTRODUODENOSCOPY (EGD) WITH PROPOFOL N/A 12/09/2019   Procedure: ESOPHAGOGASTRODUODENOSCOPY (EGD) WITH PROPOFOL;  Surgeon: Rush Landmark Telford Nab., MD;  Location: [facility] ENDOSCOPY;  Service: Gastroenterology;  Laterality: N/A;  . EUS N/A 09/02/2019   Procedure: UPPER ENDOSCOPIC ULTRASOUND (EUS) RADIAL;  Surgeon: Rush Landmark Telford Nab., MD;  Location: [facility] ENDOS[facility];  Service: Gastroenterology;  Laterality: N/A;  EUS radial/linear  . EUS N/A 12/09/2019   Procedure: UPPER ENDOSCOPIC ULTRASOUND (EUS) RADIAL;  Surgeon: Irving Copas., MD;  Location: [facility];  Service: Gastroenterology;  Laterality: N/A;  . EUS  12/09/2019   Procedure: UPPER ENDOSCOPIC ULTRASOUND (EUS) LINEAR;  Surgeon: Irving Copas., MD;  Location: [facility];  Service: Gastroenterology;;  . FINE NEEDLE ASPIRATION  09/02/2019   Procedure: FINE NEEDLE ASPIRATION (FNA) LINEAR;  Surgeon: Irving Copas., MD;  Location: [facility] ENDOS[facility];  Service: Gastroenterology;;  . FINE NEEDLE ASPIRATION  12/09/2019   Procedure: FINE NEEDLE ASPIRATION (FNA) LINEAR;  Surgeon: Irving Copas., MD;  Location: [facility];  Service: Gastroenterology;;  . HEMOSTASIS CLIP PLACEMENT  12/09/2019   Procedure: HEMOSTASIS CLIP PLACEMENT;  Surgeon: Irving Copas., MD;  Location: [facility];  Service: Gastroenterology;;  . ORIF ANKLE FRACTURE Right 04/27/2015   Procedure: OPEN REDUCTION INTERNAL FIXATION (ORIF) RIGHT BIMALLEOLAR ANKLE FRACTURE WITH SYNDESMOSIS FIXATION;  Surgeon: Leandrew Koyanagi, MD;  Location: [place];  Service: Orthopedics;  Laterality: Right;  . SUBMUCOSAL LIFTING INJECTION  12/09/2019   Procedure: SUBMUCOSAL LIFTING INJECTION;  Surgeon: Rush Landmark Telford Nab., MD;  Location: [facility] ENDOS[facility];  Service:  Gastroenterology;;  . TEE WITHOUT CARDIOVERSION N/A 08/01/2016   Procedure: TRANSESOPHAGEAL ECHOCARDIOGRAM (TEE);  Surgeon: Lelon Perla, MD;  Location: [facility] ENDOSCOPY;  Service: Cardiovascular;  Laterality: N/A;  . THYROIDECTOMY  11/24/2013   DR Dalbert Batman  . THYROIDECTOMY N/A 11/24/2013   Procedure: TOTAL THYROIDECTOMY;  Surgeon: Adin Hector, MD;  Location: [place];  Service: General;  Laterality: N/A;  . TRANSTHORACIC ECHOCARDIOGRAM  01/15/2013   EF 55% TO 65%. PROBABLE MILD HYPOKINESIS OF THE INFERIOR MYOCARDIUM. GRADE 1 DIASTOLIC DYSFUNCTION. TRIAL AR.LA IS MILDLY DILATED.    Current Medications: Current Meds  Medication Sig  . acetaminophen (TYLENOL) 650 MG CR tablet Take 650 mg by mouth every 8 (eight) hours as needed for pain.   Marland Kitchen allopurinol (ZYLOPRIM) 100 MG tablet Take 100 mg by mouth daily.  Marland Kitchen amLODipine (NORVASC) 10 MG tablet Take 10 mg by mouth daily in the afternoon.   Marland Kitchen apixaban (ELIQUIS) 5 MG TABS tablet Take 1 tablet (5 mg total) by mouth 2 (two) times daily.  . Biotin w/ Vitamins C & E (HAIR/SKIN/NAILS PO) Take 2,500 mg by mouth daily.  . carvedilol (COREG)  25 MG tablet Take 1 tablet (25 mg) by mouth in the morning and 1/2 tablet (12.5 mg) in the evening.  . Cholecalciferol (DIALYVITE VITAMIN D 5000) 125 MCG (5000 UT) capsule Take 5,000 Units by mouth daily.  . hydrALAZINE (APRESOLINE) 10 MG tablet Take 1 tablet (10 mg total) by mouth 2 (two) times daily.  . hydrochlorothiazide (MICROZIDE) 12.5 MG capsule Take 1 capsule (12.5 mg total) by mouth daily.  . isosorbide dinitrate (ISORDIL) 5 MG tablet Take 1 tablet (5 mg total) by mouth 2 (two) times daily.  Marland Kitchen levothyroxine (SYNTHROID) 88 MCG tablet Take 1 tablet (88 mcg total) by mouth daily.  . Magnesium 400 MG CAPS Take 400 mg by mouth daily.  Marland Kitchen olmesartan (BENICAR) 40 MG tablet Take 40 mg by mouth daily.   Marland Kitchen omeprazole (PRILOSEC) 40 MG capsule Take 1 capsule (40 mg total) by mouth 2 (two) times daily before a meal. Take  30 minutes before breakfast and dinner for 1 month.  Then may decrease to 40 mg once daily before breakfast or before dinner and maintain.     Allergies:   Labetalol and Codeine   Social History   Socioeconomic History  . Marital status: Widowed    Spouse name: Not on file  . Number of children: 3  . Years of education: Not on file  . Highest education level: Not on file  Occupational History  . Occupation: retired  Tobacco Use  . Smoking status: Former Smoker    Packs/day: 1.00    Years: 15.00    Pack years: 15.00    Types: Cigarettes    Quit date: 12/10/1990    Years since quitting: 29.5  . Smokeless tobacco: Never Used  . Tobacco comment: occ alcohol  Vaping Use  . Vaping Use: Never used  Substance and Sexual Activity  . Alcohol use: Yes    Comment: very rare  . Drug use: No  . Sexual activity: Not on file  Other Topics Concern  . Not on file  Social History Narrative  . Not on file   Social Determinants of Health   Financial Resource Strain:   . Difficulty of Paying Living Expenses:   Food Insecurity:   . Worried About Charity fundraiser in the Last Year:   . Arboriculturist in the Last Year:   Transportation Needs:   . Film/video editor (Medical):   Marland Kitchen Lack of Transportation (Non-Medical):   Physical Activity:   . Days of Exercise per Week:   . Minutes of Exercise per Session:   Stress:   . Feeling of Stress :   Social Connections:   . Frequency of Communication with Friends and Family:   . Frequency of Social Gatherings with Friends and Family:   . Attends Religious Services:   . Active Member of Clubs or Organizations:   . Attends Archivist Meetings:   Marland Kitchen Marital Status:      Family History: The patient's family history includes Bone cancer in her sister; Diabetes in her father; Heart disease in her brother and mother; Melanoma in her brother; Stroke in her father; Sudden death in her brother and mother. There is no history of Colon  cancer, Esophageal cancer, Inflammatory bowel disease, Liver disease, Pancreatic cancer, Rectal cancer, or Stomach cancer.  ROS:   Please see the history of present illness.     All other systems reviewed and are negative.  EKGs/Labs/Other Studies Reviewed:    The following studies  were reviewed today: Echo 09/22/2019- IMPRESSIONS    1. Left ventricular ejection fraction, by visual estimation, is 45 to  50%. The left ventricle has mildly reduced function. Normal size. There is  moderately increased left ventricular hypertrophy. Basal to mid  inferior/inferolateral hypokinesis  2. Left ventricular diastolic Doppler parameters are indeterminate  pattern of LV diastolic filling.  3. Global right ventricle has normal systolic function.The right  ventricular size is normal. No increase in right ventricular wall  thickness.  4. Left atrial size was moderately dilated.  5. Right atrial size was normal.  6. Trivial pericardial effusion is present.  7. The mitral valve is normal in structure. Mild mitral valve  regurgitation.  8. The tricuspid valve is normal in structure. Tricuspid valve  regurgitation was not visualized by color flow Doppler.  9. The aortic valve is tricuspid Aortic valve regurgitation is mild by  color flow Doppler.  10. Normal pulmonary artery systolic pressure.  11. The inferior vena cava is normal in size with greater than 50%  respiratory variability, suggesting right atrial pressure of 3 mmHg.   Myoview 09/22/2019- IMPRESSION: 1. Large infarction involving the lateral wall and inferolateral wall with a rim of peri-infarct ischemia.  Small region of moderate reversibility within the apical segment of the anterior septal wall concerning for ischemia.  2. Global hypokinesia.  3. Left ventricular ejection fraction 34%  EKG:  EKG is  ordered today.  The ekg ordered today demonstrates AF with VR 69  Recent Labs: 09/21/2019: B Natriuretic Peptide  858.8 09/23/2019: Magnesium 2.1 04/21/2020: ALT 23; BUN 24; Creatinine, Ser 1.07; Hemoglobin 13.7; Platelets 260; Potassium 4.2; Sodium 141 05/30/2020: TSH 0.297  Recent Lipid Panel    Component Value Date/Time   CHOL 169 04/21/2020 0926   TRIG 82 04/21/2020 0926   HDL 52 04/21/2020 0926   CHOLHDL 3.3 04/21/2020 0926   CHOLHDL 3.4 09/22/2019 0226   VLDL 19 09/22/2019 0226   LDLCALC 102 (H) 04/21/2020 0926    Physical Exam:    VS:  BP 140/90   Pulse 69   Ht 5\' 5"  (1.651 m)   Wt 213 lb (96.6 kg)   SpO2 98%   BMI 35.45 kg/m     Wt Readings from Last 3 Encounters:  06/24/20 213 lb (96.6 kg)  05/30/20 209 lb 12.8 oz (95.2 kg)  05/02/20 213 lb 3.2 oz (96.7 kg)     GEN: Overweight AA female, well developed in no acute distress HEENT: Normal- exophthalmus  NECK: No JVD CARDIAC: irregularly irregular, no murmurs, rubs, gallops RESPIRATORY:  Clear to auscultation without rales, wheezing or rhonchi  ABDOMEN: Soft, non-tender, non-distended MUSCULOSKELETAL:  No edema; No deformity  SKIN: Warm and dry NEUROLOGIC:  Alert and oriented x 3-slight resting tremor PSYCHIATRIC:  Normal affect   ASSESSMENT:    Persistent atrial fibrillation ([place]) 07/2016 Admitted w/ AF RVR-  successful TEE/DCCV then. 09/21/2019- recurrent AF- eventual OP DCCV Feb 2021 with return to NSR and improvement in her symptoms. Back in AF May 2021 and also hyperthyroid.  Her Synthroid dose has been adjusted by her PCP.  Plan is for repeat DCCV once she is euthyroid.   Essential hypertension Echo Oct 2020 shows EF 45-50% with moderate LVH. Repeat B/P by me 120/74  Nonischemic cardiomyopathy [facility]) 2D Oct 2020- EF 45-50% with moderate LVH  No CHF on exam  Iatrogenic hyperthyroidism Most recent Synthroid adjustment was 06/01/2020- TSH then 0.297. Check TSH and T4 next week.  If euthyroid will plan for  OP DCCV.  Chronic anticoagulation CHADS VASC=5-on Eliquis She has not missed any doses  CAD S/P  percutaneous coronary angioplasty 1992 s/p MI and PTCA of unknown vessel (Dr Sherald Barge)    09/2010 Cath: LM nl, LAD 20p, LCX 43m, RCA 24m, RPL 20. 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 Dr Benay Spice following  PLAN:    Check TSH and T4 next week (Synthroid decreased one month ago).  If Euthyroid will plan OP DCCV.    Medication Adjustments/Labs and Tests Ordered: Current medicines are reviewed at length with the patient today.  Concerns regarding medicines are outlined above.  Orders Placed This Encounter  Procedures  . Basic metabolic panel  . CBC  . TSH  . T4, free  . EKG 12-Lead   No orders of the defined types were placed in this encounter.   Patient Instructions  Medication Instructions:  Your physician recommends that you continue on your current medications as directed. Please refer to the Current Medication list given to you today.  *If you need a refill on your cardiac medications before your next appointment, please call your pharmacy*   Lab Work: Your physician recommends that you return for lab work on Thursday, 06/30/20: BMET, CBC, TSH, and Free,T4. You do not need an appointment to have blood work done in our office. Please bring your lab slips with you.  If you have labs (blood work) drawn today and your tests are completely normal, you will receive your results only by: Marland Kitchen MyChart Message (if you have MyChart) OR . A paper copy in the mail If you have any lab test that is abnormal or we need to change your treatment, we will call you to review the results.  Follow-Up: At [facility], you and your health needs are our priority.  As part of our continuing mission to provide you with exceptional heart care, we have created designated Provider Care Teams.  These Care Teams include your primary Cardiologist (physician) and Advanced Practice  Providers (APPs -  Physician Assistants and Nurse Practitioners) who all work together to provide you with the care you need, when you need it.  We recommend signing up for the patient portal called "MyChart".  Sign up information is provided on this After Visit Summary.  MyChart is used to connect with patients for Virtual Visits (Telemedicine).  Patients are able to view lab/test results, encounter notes, upcoming appointments, etc.  Non-urgent messages can be sent to your provider as well.   To learn more about what you can do with MyChart, go to [email or web address].    Your next appointment:   We will call you with your lab results about a follow-up appointment to discuss Cardioversion.      Signed, Kerin Ransom, PA-C  06/24/2020 9:27 AM    [facility]  Addendum: Free T4 and TSH now WNL.  Plan to proceed with OP DCCV. The patient understands the risks and benefits of the procedure and is agreeable to proced.  Kerin Ransom PA-C 07/01/2020 3:59 PM

## 2020-06-24 NOTE — Assessment & Plan Note (Signed)
1992 s/p MI and PTCA of unknown vessel (Dr Sherald Barge)    09/2010 Cath: LM nl, LAD 20p, LCX 38m, RCA 53m, RPL 20. Myoview 09/22/2019- scar with peri-infarct ischemia- medical rx

## 2020-06-24 NOTE — Assessment & Plan Note (Signed)
Most recent Synthroid adjustment was 06/01/2020- TSH then 0.297. Check TSH and T4 next week.  If euthyroid will plan for OP DCCV.

## 2020-06-24 NOTE — Progress Notes (Addendum)
Cardiology Office Note:    Date:  06/24/2020   ID:  Claudia Cantrell, DOB 11-23-1946, MRN [id number]  PCP:  Girtha Rm, NP-C  Cardiologist:  Shelva Majestic, MD  Electrophysiologist:  None   Referring MD: Girtha Rm, NP-C   No chief complaint on file.   History of Present Illness:    Claudia Cantrell is a 74 y.o. female with a hx of CAD, S/P remote PCI in the 78's. Cath in 2011 showed normal coronaries.  She also has, OSA-on C-pap, HTN, and PAFs/p DCCV Aug 2017. She is on Eliquis. The patient had an endoscopy 09/02/2019. She was going to have a polyp biopsied but the procedure was aborted secondary to SOB and HTN. She was to follow up with Dr Claiborne Billings but presented to the ED first on 09/21/2019 with chest pain, CHF, HTN, and AF. She ruled out for an MI. She was diuresed and her medications were adjusted for HTN and AF. Myoview showed scar with some superimposed ischemia. Echo showed an EF of 45-50% with moderate LVH and moderately dilated LA. Itwas decided not to proceed with coronary angiogram at that time.  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 She eventually underwent OP DCCV 01/11/2020.   After her DCCV she feelt like she had more energy. Overall she does note an improvement. Her EKG when I saw her in follow up 02/01/2020. EKG showed NSR, SB 48.  At that time I reduced her Coreg to 25 mg in AM, 12.5 mg in PM.  She returned 05/02/2020 for follow up. She was noted to be back in AF with VR 84.  She had noticed some increased DOE the previous 1-2 weeks and suspected she might be back in AF.  She saw her PCP 04/21/2020 and labs were obtained.  Her TSH is low-0.282 with a free T4 of 1.73.  Her Synthroid has been reduced twice.  Besides being in AF she has no other  complaints no LE edema, no chest pain.  She does note increased DOE.  She feels like she can tell she is in AF.  Past Medical History:  Diagnosis Date  . Arthritis    rheumatoid  . CAD (coronary artery disease)    a. 1992 s/p MI and PTCA of unknown vessel;  b. 09/2010 Cath: LM nl, LAD 20p, LCX 71m, RCA 31m, RPL 20.  Marland Kitchen Cervical cancer ([place]) 1977  . Family history of anesthesia complication    daughter has difficulty waking   . GERD (gastroesophageal reflux disease)    occ  . Gout 10/08/2016  . Hyperlipidemia   . Hypertensive heart disease   . Hypothyroidism   . Nonischemic cardiomyopathy ([place])    a. 07/2016 Echo: EF 40-45%, mild LVH, inferior akinesis, moderately dilated left atrium, trivial AI and MR.  Marland Kitchen PAF (paroxysmal atrial fibrillation) ([place])    a. 07/2016 Admitted w/ AF RVR-->CHA2DS2VASc = 5-->Eliquis;  b. 07/2016 successful TEE/DCCV.  Marland Kitchen Sleep apnea    a. Using CPAP.    Past Surgical History:  Procedure Laterality Date  . ABDOMINAL HYSTERECTOMY  1988  . BACK SURGERY  1994  . BIOPSY  09/02/2019   Procedure: BIOPSY;  Surgeon: Irving Copas., MD;  Location: [facility];  Service: Gastroenterology;;  . BIOPSY  12/09/2019  Procedure: BIOPSY;  Surgeon: Irving Copas., MD;  Location: [facility];  Service: Gastroenterology;;  . [place]   PTCA BY DR Lamount Cohen  . CARDIOVERSION N/A 08/01/2016   Procedure: CARDIOVERSION;  Surgeon: Lelon Perla, MD;  Location: [facility] ENDOSCOPY;  Service: Cardiovascular;  Laterality: N/A;  . CARDIOVERSION N/A 01/11/2020   Procedure: CARDIOVERSION;  Surgeon: Josue Hector, MD;  Location: [place];  Service: Cardiovascular;  Laterality: N/A;  . ENDOSCOPIC MUCOSAL RESECTION N/A 12/09/2019   Procedure: ENDOSCOPIC MUCOSAL RESECTION;  Surgeon: Rush Landmark Telford Nab., MD;  Location: [facility] ENDOSCOPY;  Service: Gastroenterology;  Laterality: N/A;  . ESOPHAGOGASTRODUODENOSCOPY (EGD) WITH PROPOFOL N/A  09/02/2019   Procedure: ESOPHAGOGASTRODUODENOSCOPY (EGD) WITH PROPOFOL;  Surgeon: Rush Landmark Telford Nab., MD;  Location: [facility];  Service: Gastroenterology;  Laterality: N/A;  . ESOPHAGOGASTRODUODENOSCOPY (EGD) WITH PROPOFOL N/A 12/09/2019   Procedure: ESOPHAGOGASTRODUODENOSCOPY (EGD) WITH PROPOFOL;  Surgeon: Rush Landmark Telford Nab., MD;  Location: [facility];  Service: Gastroenterology;  Laterality: N/A;  . EUS N/A 09/02/2019   Procedure: UPPER ENDOSCOPIC ULTRASOUND (EUS) RADIAL;  Surgeon: Rush Landmark Telford Nab., MD;  Location: [facility];  Service: Gastroenterology;  Laterality: N/A;  EUS radial/linear  . EUS N/A 12/09/2019   Procedure: UPPER ENDOSCOPIC ULTRASOUND (EUS) RADIAL;  Surgeon: Irving Copas., MD;  Location: [facility];  Service: Gastroenterology;  Laterality: N/A;  . EUS  12/09/2019   Procedure: UPPER ENDOSCOPIC ULTRASOUND (EUS) LINEAR;  Surgeon: Irving Copas., MD;  Location: [facility];  Service: Gastroenterology;;  . FINE NEEDLE ASPIRATION  09/02/2019   Procedure: FINE NEEDLE ASPIRATION (FNA) LINEAR;  Surgeon: Irving Copas., MD;  Location: [facility];  Service: Gastroenterology;;  . FINE NEEDLE ASPIRATION  12/09/2019   Procedure: FINE NEEDLE ASPIRATION (FNA) LINEAR;  Surgeon: Irving Copas., MD;  Location: [facility];  Service: Gastroenterology;;  . HEMOSTASIS CLIP PLACEMENT  12/09/2019   Procedure: HEMOSTASIS CLIP PLACEMENT;  Surgeon: Irving Copas., MD;  Location: [facility];  Service: Gastroenterology;;  . ORIF ANKLE FRACTURE Right 04/27/2015   Procedure: OPEN REDUCTION INTERNAL FIXATION (ORIF) RIGHT BIMALLEOLAR ANKLE FRACTURE WITH SYNDESMOSIS FIXATION;  Surgeon: Leandrew Koyanagi, MD;  Location: [place];  Service: Orthopedics;  Laterality: Right;  . SUBMUCOSAL LIFTING INJECTION  12/09/2019   Procedure: SUBMUCOSAL LIFTING INJECTION;  Surgeon: Rush Landmark Telford Nab., MD;  Location: [facility];  Service:  Gastroenterology;;  . TEE WITHOUT CARDIOVERSION N/A 08/01/2016   Procedure: TRANSESOPHAGEAL ECHOCARDIOGRAM (TEE);  Surgeon: Lelon Perla, MD;  Location: [facility] ENDOSCOPY;  Service: Cardiovascular;  Laterality: N/A;  . THYROIDECTOMY  11/24/2013   DR Dalbert Batman  . THYROIDECTOMY N/A 11/24/2013   Procedure: TOTAL THYROIDECTOMY;  Surgeon: Adin Hector, MD;  Location: [place];  Service: General;  Laterality: N/A;  . TRANSTHORACIC ECHOCARDIOGRAM  01/15/2013   EF 55% TO 65%. PROBABLE MILD HYPOKINESIS OF THE INFERIOR MYOCARDIUM. GRADE 1 DIASTOLIC DYSFUNCTION. TRIAL AR.LA IS MILDLY DILATED.    Current Medications: Current Meds  Medication Sig  . acetaminophen (TYLENOL) 650 MG CR tablet Take 650 mg by mouth every 8 (eight) hours as needed for pain.   Marland Kitchen allopurinol (ZYLOPRIM) 100 MG tablet Take 100 mg by mouth daily.  Marland Kitchen amLODipine (NORVASC) 10 MG tablet Take 10 mg by mouth daily in the afternoon.   Marland Kitchen apixaban (ELIQUIS) 5 MG TABS tablet Take 1 tablet (5 mg total) by mouth 2 (two) times daily.  . Biotin w/ Vitamins C & E (HAIR/SKIN/NAILS PO) Take 2,500 mg by mouth daily.  . carvedilol (COREG)  25 MG tablet Take 1 tablet (25 mg) by mouth in the morning and 1/2 tablet (12.5 mg) in the evening.  . Cholecalciferol (DIALYVITE VITAMIN D 5000) 125 MCG (5000 UT) capsule Take 5,000 Units by mouth daily.  . hydrALAZINE (APRESOLINE) 10 MG tablet Take 1 tablet (10 mg total) by mouth 2 (two) times daily.  . hydrochlorothiazide (MICROZIDE) 12.5 MG capsule Take 1 capsule (12.5 mg total) by mouth daily.  . isosorbide dinitrate (ISORDIL) 5 MG tablet Take 1 tablet (5 mg total) by mouth 2 (two) times daily.  Marland Kitchen levothyroxine (SYNTHROID) 88 MCG tablet Take 1 tablet (88 mcg total) by mouth daily.  . Magnesium 400 MG CAPS Take 400 mg by mouth daily.  Marland Kitchen olmesartan (BENICAR) 40 MG tablet Take 40 mg by mouth daily.   Marland Kitchen omeprazole (PRILOSEC) 40 MG capsule Take 1 capsule (40 mg total) by mouth 2 (two) times daily before a meal. Take  30 minutes before breakfast and dinner for 1 month.  Then may decrease to 40 mg once daily before breakfast or before dinner and maintain.     Allergies:   Labetalol and Codeine   Social History   Socioeconomic History  . Marital status: Widowed    Spouse name: Not on file  . Number of children: 3  . Years of education: Not on file  . Highest education level: Not on file  Occupational History  . Occupation: retired  Tobacco Use  . Smoking status: Former Smoker    Packs/day: 1.00    Years: 15.00    Pack years: 15.00    Types: Cigarettes    Quit date: 12/10/1990    Years since quitting: 29.5  . Smokeless tobacco: Never Used  . Tobacco comment: occ alcohol  Vaping Use  . Vaping Use: Never used  Substance and Sexual Activity  . Alcohol use: Yes    Comment: very rare  . Drug use: No  . Sexual activity: Not on file  Other Topics Concern  . Not on file  Social History Narrative  . Not on file   Social Determinants of Health   Financial Resource Strain:   . Difficulty of Paying Living Expenses:   Food Insecurity:   . Worried About Charity fundraiser in the Last Year:   . Arboriculturist in the Last Year:   Transportation Needs:   . Film/video editor (Medical):   Marland Kitchen Lack of Transportation (Non-Medical):   Physical Activity:   . Days of Exercise per Week:   . Minutes of Exercise per Session:   Stress:   . Feeling of Stress :   Social Connections:   . Frequency of Communication with Friends and Family:   . Frequency of Social Gatherings with Friends and Family:   . Attends Religious Services:   . Active Member of Clubs or Organizations:   . Attends Archivist Meetings:   Marland Kitchen Marital Status:      Family History: The patient's family history includes Bone cancer in her sister; Diabetes in her father; Heart disease in her brother and mother; Melanoma in her brother; Stroke in her father; Sudden death in her brother and mother. There is no history of Colon  cancer, Esophageal cancer, Inflammatory bowel disease, Liver disease, Pancreatic cancer, Rectal cancer, or Stomach cancer.  ROS:   Please see the history of present illness.     All other systems reviewed and are negative.  EKGs/Labs/Other Studies Reviewed:    The following studies  were reviewed today: Echo 09/22/2019- IMPRESSIONS    1. Left ventricular ejection fraction, by visual estimation, is 45 to  50%. The left ventricle has mildly reduced function. Normal size. There is  moderately increased left ventricular hypertrophy. Basal to mid  inferior/inferolateral hypokinesis  2. Left ventricular diastolic Doppler parameters are indeterminate  pattern of LV diastolic filling.  3. Global right ventricle has normal systolic function.The right  ventricular size is normal. No increase in right ventricular wall  thickness.  4. Left atrial size was moderately dilated.  5. Right atrial size was normal.  6. Trivial pericardial effusion is present.  7. The mitral valve is normal in structure. Mild mitral valve  regurgitation.  8. The tricuspid valve is normal in structure. Tricuspid valve  regurgitation was not visualized by color flow Doppler.  9. The aortic valve is tricuspid Aortic valve regurgitation is mild by  color flow Doppler.  10. Normal pulmonary artery systolic pressure.  11. The inferior vena cava is normal in size with greater than 50%  respiratory variability, suggesting right atrial pressure of 3 mmHg.   Myoview 09/22/2019- IMPRESSION: 1. Large infarction involving the lateral wall and inferolateral wall with a rim of peri-infarct ischemia.  Small region of moderate reversibility within the apical segment of the anterior septal wall concerning for ischemia.  2. Global hypokinesia.  3. Left ventricular ejection fraction 34%  EKG:  EKG is  ordered today.  The ekg ordered today demonstrates AF with VR 69  Recent Labs: 09/21/2019: B Natriuretic Peptide  858.8 09/23/2019: Magnesium 2.1 04/21/2020: ALT 23; BUN 24; Creatinine, Ser 1.07; Hemoglobin 13.7; Platelets 260; Potassium 4.2; Sodium 141 05/30/2020: TSH 0.297  Recent Lipid Panel    Component Value Date/Time   CHOL 169 04/21/2020 0926   TRIG 82 04/21/2020 0926   HDL 52 04/21/2020 0926   CHOLHDL 3.3 04/21/2020 0926   CHOLHDL 3.4 09/22/2019 0226   VLDL 19 09/22/2019 0226   LDLCALC 102 (H) 04/21/2020 0926    Physical Exam:    VS:  BP 140/90   Pulse 69   Ht 5\' 5"  (1.651 m)   Wt 213 lb (96.6 kg)   SpO2 98%   BMI 35.45 kg/m     Wt Readings from Last 3 Encounters:  06/24/20 213 lb (96.6 kg)  05/30/20 209 lb 12.8 oz (95.2 kg)  05/02/20 213 lb 3.2 oz (96.7 kg)     GEN: Overweight AA female, well developed in no acute distress HEENT: Normal- exophthalmus  NECK: No JVD CARDIAC: irregularly irregular, no murmurs, rubs, gallops RESPIRATORY:  Clear to auscultation without rales, wheezing or rhonchi  ABDOMEN: Soft, non-tender, non-distended MUSCULOSKELETAL:  No edema; No deformity  SKIN: Warm and dry NEUROLOGIC:  Alert and oriented x 3-slight resting tremor PSYCHIATRIC:  Normal affect   ASSESSMENT:    Persistent atrial fibrillation ([place]) 07/2016 Admitted w/ AF RVR-  successful TEE/DCCV then. 09/21/2019- recurrent AF- eventual OP DCCV Feb 2021 with return to NSR and improvement in her symptoms. Back in AF May 2021 and also hyperthyroid.  Her Synthroid dose has been adjusted by her PCP.  Plan is for repeat DCCV once she is euthyroid.   Essential hypertension Echo Oct 2020 shows EF 45-50% with moderate LVH. Repeat B/P by me 120/74  Nonischemic cardiomyopathy [facility]) 2D Oct 2020- EF 45-50% with moderate LVH  No CHF on exam  Iatrogenic hyperthyroidism Most recent Synthroid adjustment was 06/01/2020- TSH then 0.297. Check TSH and T4 next week.  If euthyroid will plan for  OP DCCV.  Chronic anticoagulation CHADS VASC=5-on Eliquis She has not missed any doses  CAD S/P  percutaneous coronary angioplasty 1992 s/p MI and PTCA of unknown vessel (Dr Sherald Barge)    09/2010 Cath: LM nl, LAD 20p, LCX 2m, RCA 64m, RPL 20. 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 Dr Benay Spice following  PLAN:    Check TSH and T4 next week (Synthroid decreased one month ago).  If Euthyroid will plan OP DCCV.    Medication Adjustments/Labs and Tests Ordered: Current medicines are reviewed at length with the patient today.  Concerns regarding medicines are outlined above.  Orders Placed This Encounter  Procedures  . Basic metabolic panel  . CBC  . TSH  . T4, free  . EKG 12-Lead   No orders of the defined types were placed in this encounter.   Patient Instructions  Medication Instructions:  Your physician recommends that you continue on your current medications as directed. Please refer to the Current Medication list given to you today.  *If you need a refill on your cardiac medications before your next appointment, please call your pharmacy*   Lab Work: Your physician recommends that you return for lab work on Thursday, 06/30/20: BMET, CBC, TSH, and Free,T4. You do not need an appointment to have blood work done in our office. Please bring your lab slips with you.  If you have labs (blood work) drawn today and your tests are completely normal, you will receive your results only by: Marland Kitchen MyChart Message (if you have MyChart) OR . A paper copy in the mail If you have any lab test that is abnormal or we need to change your treatment, we will call you to review the results.  Follow-Up: At [facility], you and your health needs are our priority.  As part of our continuing mission to provide you with exceptional heart care, we have created designated Provider Care Teams.  These Care Teams include your primary Cardiologist (physician) and Advanced Practice  Providers (APPs -  Physician Assistants and Nurse Practitioners) who all work together to provide you with the care you need, when you need it.  We recommend signing up for the patient portal called "MyChart".  Sign up information is provided on this After Visit Summary.  MyChart is used to connect with patients for Virtual Visits (Telemedicine).  Patients are able to view lab/test results, encounter notes, upcoming appointments, etc.  Non-urgent messages can be sent to your provider as well.   To learn more about what you can do with MyChart, go to [email or web address].    Your next appointment:   We will call you with your lab results about a follow-up appointment to discuss Cardioversion.      Signed, Kerin Ransom, PA-C  06/24/2020 9:27 AM    [facility]  Addendum: Free T4 and TSH now WNL.  Plan to proceed with OP DCCV. The patient understands the risks and benefits of the procedure and is agreeable to proced.  Kerin Ransom PA-C 07/01/2020 3:59 PM

## 2020-06-24 NOTE — Assessment & Plan Note (Signed)
CHADS VASC=5-on Eliquis She has not missed any doses

## 2020-06-30 DIAGNOSIS — I4819 Other persistent atrial fibrillation: Secondary | ICD-10-CM | POA: Diagnosis not present

## 2020-06-30 LAB — TSH: TSH: 2.18 u[IU]/mL (ref 0.450–4.500)

## 2020-06-30 LAB — BASIC METABOLIC PANEL
BUN/Creatinine Ratio: 18 (ref 12–28)
BUN: 22 mg/dL (ref 8–27)
CO2: 25 mmol/L (ref 20–29)
Calcium: 9.8 mg/dL (ref 8.7–10.3)
Chloride: 103 mmol/L (ref 96–106)
Creatinine, Ser: 1.24 mg/dL — ABNORMAL HIGH (ref 0.57–1.00)
GFR calc Af Amer: 50 mL/min/{1.73_m2} — ABNORMAL LOW (ref >59)
GFR calc non Af Amer: 44 mL/min/{1.73_m2} — ABNORMAL LOW (ref >59)
Glucose: 82 mg/dL (ref 65–99)
Potassium: 4.4 mmol/L (ref 3.5–5.2)
Sodium: 143 mmol/L (ref 134–144)

## 2020-06-30 LAB — CBC
Hematocrit: 47 % — ABNORMAL HIGH (ref 34.0–46.6)
Hemoglobin: 15.3 g/dL (ref 11.1–15.9)
MCH: 30.1 pg (ref 26.6–33.0)
MCHC: 32.6 g/dL (ref 31.5–35.7)
MCV: 92 fL (ref 79–97)
Platelets: 254 10*3/uL (ref 150–450)
RBC: 5.09 x10E6/uL (ref 3.77–5.28)
RDW: 13.9 % (ref 11.7–15.4)
WBC: 4.2 10*3/uL (ref 3.4–10.8)

## 2020-06-30 LAB — T4, FREE: Free T4: 1.58 ng/dL (ref 0.82–1.77)

## 2020-07-01 ENCOUNTER — Telehealth: Payer: Self-pay | Admitting: *Deleted

## 2020-07-01 ENCOUNTER — Other Ambulatory Visit: Payer: Self-pay | Admitting: *Deleted

## 2020-07-01 NOTE — Telephone Encounter (Signed)
-----   Message from Erlene Quan, [place] sent at 07/01/2020  1:43 PM EDT ----- Angie Fava Ms Steinhauser's thyroid function is now normal- we can set her up for OP DCCV next week.  I'll call her to go over risk once you have a date scheduled.   Kerin Ransom PA-C 07/01/2020 1:43 PM

## 2020-07-01 NOTE — Telephone Encounter (Signed)
Spoke to patient aware of results and ok to proceed with DCCV   You are scheduled for a Cardioversion on 07/08/20 with Dr. Gardiner Rhyme.  Please arrive at the [facility] (Main Entrance A) at [facility]: [address] [place], [place] [address] at 8:30 am. (1 hour prior to procedure unless lab work is needed; if lab work is needed arrive 1.5 hours ahead)  DIET: Nothing to eat or drink after midnight except a sip of water with medications (see medication instructions below)  Medication Instructions: Hold HCTZ AM of procedure  Continue your anticoagulant: Eliquis  You will need to continue your anticoagulant after your  procedure until you  are told by your  Provider that it is safe to stop   Labs: completed 7/22  COVID TEST: Tuesday 7/27 at 12:15 pm [facility] must have a responsible person to drive you home and stay in the waiting area during your procedure. Failure to do so could result in cancellation.  Bring your insurance cards.  *Special Note: Every effort is made to have your procedure done on time. Occasionally there are emergencies that occur at the hospital that may cause delays. Please be patient if a delay does occur.       Spoke to patient-aware of instructions and verbalized understanding.   Message sent to precert.     ,

## 2020-07-01 NOTE — Telephone Encounter (Signed)
Thanks Hayley!  Kerin Ransom PA-C 07/01/2020 3:56 PM

## 2020-07-05 ENCOUNTER — Other Ambulatory Visit (HOSPITAL_COMMUNITY)
Admission: RE | Admit: 2020-07-05 | Discharge: 2020-07-05 | Disposition: A | Discharge status: Home / Self Care | Payer: PPO | Source: Ambulatory Visit | Attending: Cardiology | Admitting: Cardiology

## 2020-07-05 DIAGNOSIS — Z20822 Contact with and (suspected) exposure to covid-19: Secondary | ICD-10-CM | POA: Diagnosis not present

## 2020-07-05 DIAGNOSIS — Z01812 Encounter for preprocedural laboratory examination: Secondary | ICD-10-CM | POA: Diagnosis not present

## 2020-07-05 LAB — SARS CORONAVIRUS 2 (TAT 6-24 HRS): SARS Coronavirus 2: NEGATIVE

## 2020-07-08 ENCOUNTER — Encounter (HOSPITAL_COMMUNITY)
Admission: RE | Disposition: A | Discharge status: Home / Self Care | Payer: Self-pay | Source: Home / Self Care | Attending: Cardiology

## 2020-07-08 ENCOUNTER — Other Ambulatory Visit: Payer: Self-pay

## 2020-07-08 ENCOUNTER — Ambulatory Visit (HOSPITAL_COMMUNITY): Payer: PPO | Admitting: Certified Registered Nurse Anesthetist

## 2020-07-08 ENCOUNTER — Encounter (HOSPITAL_COMMUNITY): Payer: Self-pay | Admitting: Cardiology

## 2020-07-08 ENCOUNTER — Ambulatory Visit (HOSPITAL_COMMUNITY)
Admission: RE | Admit: 2020-07-08 | Discharge: 2020-07-08 | Disposition: A | Discharge status: Home / Self Care | Payer: PPO | Attending: Cardiology | Admitting: Cardiology

## 2020-07-08 DIAGNOSIS — Z7901 Long term (current) use of anticoagulants: Secondary | ICD-10-CM | POA: Diagnosis not present

## 2020-07-08 DIAGNOSIS — I428 Other cardiomyopathies: Secondary | ICD-10-CM | POA: Diagnosis not present

## 2020-07-08 DIAGNOSIS — D481 Neoplasm of uncertain behavior of connective and other soft tissue: Secondary | ICD-10-CM | POA: Diagnosis not present

## 2020-07-08 DIAGNOSIS — G4733 Obstructive sleep apnea (adult) (pediatric): Secondary | ICD-10-CM | POA: Diagnosis not present

## 2020-07-08 DIAGNOSIS — E039 Hypothyroidism, unspecified: Secondary | ICD-10-CM | POA: Diagnosis not present

## 2020-07-08 DIAGNOSIS — E785 Hyperlipidemia, unspecified: Secondary | ICD-10-CM | POA: Insufficient documentation

## 2020-07-08 DIAGNOSIS — I4819 Other persistent atrial fibrillation: Secondary | ICD-10-CM | POA: Diagnosis not present

## 2020-07-08 DIAGNOSIS — Z87891 Personal history of nicotine dependence: Secondary | ICD-10-CM | POA: Insufficient documentation

## 2020-07-08 DIAGNOSIS — K219 Gastro-esophageal reflux disease without esophagitis: Secondary | ICD-10-CM | POA: Insufficient documentation

## 2020-07-08 DIAGNOSIS — I10 Essential (primary) hypertension: Secondary | ICD-10-CM | POA: Insufficient documentation

## 2020-07-08 DIAGNOSIS — Z885 Allergy status to narcotic agent status: Secondary | ICD-10-CM | POA: Insufficient documentation

## 2020-07-08 DIAGNOSIS — M109 Gout, unspecified: Secondary | ICD-10-CM | POA: Diagnosis not present

## 2020-07-08 DIAGNOSIS — I110 Hypertensive heart disease with heart failure: Secondary | ICD-10-CM | POA: Diagnosis not present

## 2020-07-08 DIAGNOSIS — I2510 Atherosclerotic heart disease of native coronary artery without angina pectoris: Secondary | ICD-10-CM | POA: Diagnosis not present

## 2020-07-08 DIAGNOSIS — Z79899 Other long term (current) drug therapy: Secondary | ICD-10-CM | POA: Diagnosis not present

## 2020-07-08 DIAGNOSIS — Z955 Presence of coronary angioplasty implant and graft: Secondary | ICD-10-CM | POA: Insufficient documentation

## 2020-07-08 DIAGNOSIS — I252 Old myocardial infarction: Secondary | ICD-10-CM | POA: Insufficient documentation

## 2020-07-08 DIAGNOSIS — I5041 Acute combined systolic (congestive) and diastolic (congestive) heart failure: Secondary | ICD-10-CM | POA: Diagnosis not present

## 2020-07-08 HISTORY — PX: CARDIOVERSION: SHX1299

## 2020-07-08 SURGERY — CARDIOVERSION
Anesthesia: General

## 2020-07-08 MED ORDER — PROPOFOL 10 MG/ML IV BOLUS
INTRAVENOUS | Status: DC | PRN
  Administered 2020-07-08: 50 mg via INTRAVENOUS

## 2020-07-08 MED ORDER — SODIUM CHLORIDE 0.9 % IV SOLN: INTRAVENOUS | Status: DC | PRN

## 2020-07-08 MED ORDER — SODIUM CHLORIDE 0.9 % IV SOLN
INTRAVENOUS | Status: AC | PRN
  Administered 2020-07-08: 500 mL via INTRAMUSCULAR

## 2020-07-08 MED ORDER — LIDOCAINE 2% (20 MG/ML) 5 ML SYRINGE
INTRAMUSCULAR | Status: DC | PRN
  Administered 2020-07-08: 50 mg via INTRAVENOUS

## 2020-07-08 NOTE — Anesthesia Preprocedure Evaluation (Addendum)
Anesthesia Evaluation  Patient identified by MRN, date of birth, ID band Patient awake    Reviewed: Allergy & Precautions, NPO status , Patient's Chart, lab work & pertinent test results, reviewed documented beta blocker date and time   History of Anesthesia Complications (+) Family history of anesthesia reaction  Airway Mallampati: II  TM Distance: >3 FB Neck ROM: Full    Dental no notable dental hx. (+) Teeth Intact   Pulmonary sleep apnea and Continuous Positive Airway Pressure Ventilation , former smoker,    Pulmonary exam normal breath sounds clear to auscultation       Cardiovascular hypertension, Pt. on medications + CAD and +CHF  Normal cardiovascular exam Rhythm:Irregular Rate:Normal  2017:   The left ventricular ejection fraction is normal (55-65%).  Nuclear stress EF: 55%.  There was no ST segment deviation noted during stress.  Defect 1: There is a small defect of severe severity present in the basal inferolateral and mid inferolateral location.  Findings consistent with prior myocardial infarction.  This is a low risk study.   There is a small area, severe severity irreversible perfusion defect in the basal and mid inferolateral walls consistent an infarct in the LCX territory. No ischemia.    Neuro/Psych negative neurological ROS  negative psych ROS   GI/Hepatic GERD  Medicated and Controlled,  Endo/Other  Hypothyroidism Hyperthyroidism   Renal/GU Renal InsufficiencyRenal disease  negative genitourinary   Musculoskeletal  (+) Arthritis , Osteoarthritis,    Abdominal (+) + obese,   Peds  Hematology Eliquis- last dose this am   Anesthesia Other Findings   Reproductive/Obstetrics                            Anesthesia Physical Anesthesia Plan  ASA: III  Anesthesia Plan: General   Post-op Pain Management:    Induction:   PONV Risk Score and Plan: 3 and Treatment  may vary due to age or medical condition  Airway Management Planned: Natural Airway and Mask  Additional Equipment:   Intra-op Plan:   Post-operative Plan:   Informed Consent: I have reviewed the patients History and Physical, chart, labs and discussed the procedure including the risks, benefits and alternatives for the proposed anesthesia with the patient or authorized representative who has indicated his/her understanding and acceptance.       Plan Discussed with: CRNA and Anesthesiologist  Anesthesia Plan Comments:         Anesthesia Quick Evaluation

## 2020-07-08 NOTE — Interval H&P Note (Signed)
History and Physical Interval Note:  07/08/2020 9:17 AM  Claudia Cantrell  has presented today for surgery, with the diagnosis of AFIB.  The various methods of treatment have been discussed with the patient and family. After consideration of risks, benefits and other options for treatment, the patient has consented to  Procedure(s): CARDIOVERSION (N/A) as a surgical intervention.  The patient's history has been reviewed, patient examined, no change in status, stable for surgery.  I have reviewed the patient's chart and labs.  Questions were answered to the patient's satisfaction.     [name]

## 2020-07-08 NOTE — Transfer of Care (Signed)
Immediate Anesthesia Transfer of Care Note  Patient: Claudia Cantrell  Procedure(s) Performed: CARDIOVERSION (N/A )  Patient Location: Endoscopy Unit  Anesthesia Type:General  Level of Consciousness: awake and alert   Airway & Oxygen Therapy: Patient Spontanous Breathing  Post-op Assessment: Report given to RN, Post -op Vital signs reviewed and stable and Patient moving all extremities X 4  Post vital signs: Reviewed and stable  Last Vitals:  Vitals Value Taken Time  BP    Temp    Pulse    Resp    SpO2      Last Pain:  Vitals:   07/08/20 0903  TempSrc: Oral  PainSc: 0-No pain         Complications: No complications documented.

## 2020-07-08 NOTE — Anesthesia Postprocedure Evaluation (Signed)
Anesthesia Post Note  Patient: Claudia Cantrell  Procedure(s) Performed: CARDIOVERSION (N/A )     Patient location during evaluation: PACU Anesthesia Type: General Level of consciousness: awake and alert and oriented Pain management: pain level controlled Vital Signs Assessment: post-procedure vital signs reviewed and stable Respiratory status: spontaneous breathing, nonlabored ventilation and respiratory function stable Cardiovascular status: blood pressure returned to baseline and stable Postop Assessment: no apparent nausea or vomiting Anesthetic complications: no   No complications documented.  Last Vitals:  Vitals:   07/08/20 0942 07/08/20 0952  BP: (!) 98/63 (!) 112/58  Pulse: 50 (!) 41  Resp: 20 19  Temp:    SpO2: 94% 96%    Last Pain:  Vitals:   07/08/20 0933  TempSrc: Temporal  PainSc: 0-No pain                 Claudia Cantrell A.

## 2020-07-08 NOTE — CV Procedure (Signed)
Procedure:   DCCV  Indication:  Symptomatic atrial fibrillation  Procedure Note:  The patient signed informed consent.  They have had had therapeutic anticoagulation with Elqiuis greater than 3 weeks.  Anesthesia was administered by Dr. Royce Macadamia.  Adequate airway was maintained throughout and vital followed per protocol.  They were cardioverted x 1 with 200J of biphasic synchronized energy.  They converted to NSR with rate 50s.  There were no apparent complications.  The patient had normal neuro status and respiratory status post procedure with vitals stable as recorded elsewhere.    Follow up:  They will continue on current medical therapy and follow up with cardiology as scheduled.  Oswaldo Milian, MD 07/08/2020 9:29 AM

## 2020-07-08 NOTE — Anesthesia Procedure Notes (Signed)
Procedure Name: General with mask airway Date/Time: 07/08/2020 9:25 AM Performed by: Harden Mo, CRNA Pre-anesthesia Checklist: Patient identified, Emergency Drugs available, Suction available and Patient being monitored Patient Re-evaluated:Patient Re-evaluated prior to induction Oxygen Delivery Method: Ambu bag Preoxygenation: Pre-oxygenation with 100% oxygen Induction Type: IV induction Ventilation: Mask ventilation without difficulty Placement Confirmation: positive ETCO2 and breath sounds checked- equal and bilateral Dental Injury: Teeth and Oropharynx as per pre-operative assessment

## 2020-07-11 ENCOUNTER — Other Ambulatory Visit: Payer: Self-pay

## 2020-07-11 ENCOUNTER — Encounter (HOSPITAL_COMMUNITY): Payer: Self-pay | Admitting: Cardiology

## 2020-07-11 ENCOUNTER — Other Ambulatory Visit: Payer: PPO

## 2020-07-11 DIAGNOSIS — R7989 Other specified abnormal findings of blood chemistry: Secondary | ICD-10-CM | POA: Diagnosis not present

## 2020-07-12 LAB — TSH: TSH: 2.5 u[IU]/mL (ref 0.450–4.500)

## 2020-08-05 ENCOUNTER — Ambulatory Visit (INDEPENDENT_AMBULATORY_CARE_PROVIDER_SITE_OTHER): Payer: PPO | Admitting: Family Medicine

## 2020-08-05 ENCOUNTER — Other Ambulatory Visit: Payer: Self-pay

## 2020-08-05 ENCOUNTER — Encounter: Payer: Self-pay | Admitting: Family Medicine

## 2020-08-05 VITALS — BP 122/84 | HR 59 | Wt 216.2 lb

## 2020-08-05 DIAGNOSIS — E890 Postprocedural hypothyroidism: Secondary | ICD-10-CM | POA: Diagnosis not present

## 2020-08-05 DIAGNOSIS — E785 Hyperlipidemia, unspecified: Secondary | ICD-10-CM

## 2020-08-05 DIAGNOSIS — R7303 Prediabetes: Secondary | ICD-10-CM

## 2020-08-05 NOTE — Patient Instructions (Signed)
Cut back on fried foods, fatty foods and foods high in carbohydrates and cholesterol.  Increase your physical activity  Stay well-hydrated  Come in fasting in 6 weeks and we will check all of your labs

## 2020-08-05 NOTE — Progress Notes (Signed)
° °  Subjective:    Patient ID: Claudia Cantrell, female    DOB: November 20, 1946, 74 y.o.   MRN: [id number]  HPI Chief Complaint  Patient presents with   3 month follow-up    3 month follow-up, wants to know if she can take alli weight loss medication   She is here for a 3 month follow up.  She has questions about certain supplements that she should take including a weight loss medication. We recently adjusted her thyroid medication and her thyroid function is now in normal range.  It was recently checked by her cardiologist.  We are working on lowering her LDL.  Our goal is 70 or less. States she did not take her Crestor for 2 weeks.   She has prediabetes.  States she has not been eating healthy.  States she has been eating has puppies and fried fish and going to cookouts where she overeats.  She denies any new complaints today.  No dizziness, chest pain, palpitations, shortness of breath, abdominal pain, nausea, vomiting or diarrhea.   Review of Systems Pertinent positives and negatives in the history of present illness.     Objective:   Physical Exam BP 122/84    Pulse (!) 59    Wt 216 lb 3.2 oz (98.1 kg)    SpO2 98%    BMI 35.98 kg/m   Alert and oriented and in no acute distress.  Not otherwise examined.      Assessment & Plan:  Dyslipidemia, goal LDL below 70  Prediabetes  Postsurgical hypothyroidism  Reviewed her medications including supplements that she is interested in taking.  Discouraged biotin and the weight loss medication due to possible interference with her thyroid medication. She has not been taking her statin regularly or eating a healthy diet.  She would like to hold off on checking A1c and her cholesterol and allow more time for her to get back on track with her diet and exercise. Recommend cutting back on fried foods, carbohydrates and fatty foods.  Discussed increasing her physical activity as tolerated. She would like to return in 6 weeks for fasting labs  and follow-up and I am okay with this.

## 2020-08-18 ENCOUNTER — Other Ambulatory Visit: Payer: Self-pay | Admitting: Cardiovascular Disease

## 2020-08-18 ENCOUNTER — Other Ambulatory Visit: Payer: Self-pay | Admitting: Family Medicine

## 2020-09-16 ENCOUNTER — Ambulatory Visit (INDEPENDENT_AMBULATORY_CARE_PROVIDER_SITE_OTHER): Payer: PPO | Admitting: Family Medicine

## 2020-09-16 ENCOUNTER — Other Ambulatory Visit: Payer: Self-pay

## 2020-09-16 ENCOUNTER — Encounter: Payer: Self-pay | Admitting: Family Medicine

## 2020-09-16 VITALS — BP 130/90 | HR 54 | Wt 219.8 lb

## 2020-09-16 DIAGNOSIS — R7303 Prediabetes: Secondary | ICD-10-CM

## 2020-09-16 DIAGNOSIS — E890 Postprocedural hypothyroidism: Secondary | ICD-10-CM

## 2020-09-16 DIAGNOSIS — E785 Hyperlipidemia, unspecified: Secondary | ICD-10-CM

## 2020-09-16 DIAGNOSIS — Z5181 Encounter for therapeutic drug level monitoring: Secondary | ICD-10-CM | POA: Diagnosis not present

## 2020-09-16 DIAGNOSIS — I10 Essential (primary) hypertension: Secondary | ICD-10-CM

## 2020-09-16 LAB — POCT GLYCOSYLATED HEMOGLOBIN (HGB A1C): Hemoglobin A1C: 6 % — AB (ref 4.0–5.6)

## 2020-09-16 NOTE — Progress Notes (Signed)
   Subjective:    Patient ID: Claudia Cantrell, female    DOB: 1946/04/15, 74 y.o.   MRN: [id number]  HPI Chief Complaint  Patient presents with  . 6 week follow-up    6 week follow-up on lipids, HTN, prediabetes and thryoid   Here for follow up on chronic health conditions.   Prediabetes- States she has been eating a lot of fruit lately.   HTN- good compliance with her medication  Checks BP at home  Followed by cardiologist.   HL- LDL 102 last time. Taking statin   Taking levothyroxine 88 mcg daily  No new concerns today.   Denies fever, chills, dizziness, chest pain, palpitations, shortness of breath, abdominal pain, N/V/D, urinary symptoms, LE edema.     Plans to get her Covid booster and flu shot.    Review of Systems Pertinent positives and negatives in the history of present illness.     Objective:   Physical Exam BP 130/90   Pulse (!) 54   Wt 219 lb 12.8 oz (99.7 kg)   BMI 36.58 kg/m   Alert and oriented in no acute distress.  Extremities without edema.  Not otherwise examined.      Assessment & Plan:  Prediabetes - Plan: HgB A1c, Comprehensive metabolic panel, CBC with Differential/Platelet -Hemoglobin A1c 6.0% today.  Discussed cutting back on fruit which has a lot of sugar in it.  Essential hypertension - Plan: Comprehensive metabolic panel, CBC with Differential/Platelet -Reports normal blood pressure at home.  Continue medication and keep an eye on blood pressure and follow-up with cardiology  Medication monitoring encounter  Postoperative hypothyroidism - Plan: TSH -Check TSH and follow-up  Dyslipidemia, goal LDL below 70 - Plan: Lipid panel Discussed that her LDL has not been at goal.  She does report good compliance with her statin.  Recommend low-fat, low-cholesterol diet

## 2020-09-16 NOTE — Patient Instructions (Addendum)
Your hemoglobin A1c is 6.0% and your blood sugars are still controlled.  Cut back on sugar and carbohydrates (fruit is natural but full of sugar).   Continue your current medications.   I will be in touch with your lab results.

## 2020-09-17 LAB — COMPREHENSIVE METABOLIC PANEL
ALT: 11 IU/L (ref 0–32)
AST: 17 IU/L (ref 0–40)
Albumin/Globulin Ratio: 1.5 (ref 1.2–2.2)
Albumin: 4.6 g/dL (ref 3.7–4.7)
Alkaline Phosphatase: 106 IU/L (ref 44–121)
BUN/Creatinine Ratio: 18 (ref 12–28)
BUN: 16 mg/dL (ref 8–27)
Bilirubin Total: 0.4 mg/dL (ref 0.0–1.2)
CO2: 27 mmol/L (ref 20–29)
Calcium: 10 mg/dL (ref 8.7–10.3)
Chloride: 100 mmol/L (ref 96–106)
Creatinine, Ser: 0.9 mg/dL (ref 0.57–1.00)
GFR calc Af Amer: 74 mL/min/{1.73_m2} (ref >59)
GFR calc non Af Amer: 64 mL/min/{1.73_m2} (ref >59)
Globulin, Total: 3 g/dL (ref 1.5–4.5)
Glucose: 87 mg/dL (ref 65–99)
Potassium: 4.3 mmol/L (ref 3.5–5.2)
Sodium: 141 mmol/L (ref 134–144)
Total Protein: 7.6 g/dL (ref 6.0–8.5)

## 2020-09-17 LAB — CBC WITH DIFFERENTIAL/PLATELET
Basophils Absolute: 0 10*3/uL (ref 0.0–0.2)
Basos: 1 %
EOS (ABSOLUTE): 0.1 10*3/uL (ref 0.0–0.4)
Eos: 3 %
Hematocrit: 44.5 % (ref 34.0–46.6)
Hemoglobin: 14.7 g/dL (ref 11.1–15.9)
Immature Grans (Abs): 0 10*3/uL (ref 0.0–0.1)
Immature Granulocytes: 0 %
Lymphocytes Absolute: 1.4 10*3/uL (ref 0.7–3.1)
Lymphs: 35 %
MCH: 30.2 pg (ref 26.6–33.0)
MCHC: 33 g/dL (ref 31.5–35.7)
MCV: 91 fL (ref 79–97)
Monocytes Absolute: 0.4 10*3/uL (ref 0.1–0.9)
Monocytes: 9 %
Neutrophils Absolute: 2.1 10*3/uL (ref 1.4–7.0)
Neutrophils: 52 %
Platelets: 235 10*3/uL (ref 150–450)
RBC: 4.87 x10E6/uL (ref 3.77–5.28)
RDW: 14.4 % (ref 11.7–15.4)
WBC: 4 10*3/uL (ref 3.4–10.8)

## 2020-09-17 LAB — LIPID PANEL
Chol/HDL Ratio: 4 ratio (ref 0.0–4.4)
Cholesterol, Total: 166 mg/dL (ref 100–199)
HDL: 41 mg/dL (ref >39)
LDL Chol Calc (NIH): 94 mg/dL (ref 0–99)
Triglycerides: 177 mg/dL — ABNORMAL HIGH (ref 0–149)
VLDL Cholesterol Cal: 31 mg/dL (ref 5–40)

## 2020-09-17 LAB — TSH: TSH: 2.23 u[IU]/mL (ref 0.450–4.500)

## 2020-09-18 NOTE — Progress Notes (Signed)
Her kidney function has improved and her LDL or bad cholesterol. Her thyroid function is still normal. Continue eating a low fat, low cholesterol diet and getting exercise as tolerated.

## 2020-10-06 ENCOUNTER — Other Ambulatory Visit: Payer: Self-pay | Admitting: Internal Medicine

## 2020-10-06 MED ORDER — ROSUVASTATIN CALCIUM 40 MG PO TABS
40.0000 mg | ORAL_TABLET | Freq: Every day | ORAL | 1 refills | Status: DC
Start: 2020-10-06 — End: 2021-10-23

## 2020-10-06 NOTE — Progress Notes (Unsigned)
Pt taking crestor daily and had plenty of meds but is running low and needs a refill on this

## 2020-10-14 NOTE — Progress Notes (Signed)
Office Visit Note  Patient: Claudia Cantrell             Date of Birth: 1946/01/10           MRN: [id number]             PCP: Girtha Rm, NP-C Referring: Girtha Rm, NP-C Visit Date: 10/18/2020 Occupation: @GUAROCC @  Subjective:  Pain in both knees  History of Present Illness: Claudia Cantrell is a 74 y.o. female with history of gout. She is taking allopurinol 100 mg daily.  She denies any recent gout flares. She is tolerating allopurinol without any side effects.  She has been experiencing increased pain in both knee joints and lower back, which she attributes to increased weight gain over the past several months.  She denies any joint swelling.  Her hand pain and stiffness has improved. She states that she has been eating a lot of fruit as well as pasta which she feels is contributing to the weight gain.  She is feeling very discouraged and plans on starting to change her diet.   Activities of Daily Living:  Patient reports morning stiffness for several  hours.   Patient Reports nocturnal pain.  Difficulty dressing/grooming: Denies Difficulty climbing stairs: Reports Difficulty getting out of chair: Denies Difficulty using hands for taps, buttons, cutlery, and/or writing: Denies  Review of Systems  Constitutional: Negative for fatigue.  HENT: Negative for mouth sores, mouth dryness and nose dryness.   Eyes: Negative for pain, itching and dryness.  Respiratory: Negative for shortness of breath and difficulty breathing.   Cardiovascular: Negative for chest pain and palpitations.  Gastrointestinal: Negative for blood in stool, constipation and diarrhea.  Endocrine: Negative for increased urination.  Genitourinary: Negative for difficulty urinating.  Musculoskeletal: Positive for arthralgias, joint pain, joint swelling and morning stiffness. Negative for myalgias, muscle tenderness and myalgias.  Skin: Negative for color change, rash and redness.  Allergic/Immunologic:  Negative for susceptible to infections.  Neurological: Negative for dizziness, numbness, headaches, memory loss and weakness.  Hematological: Negative for bruising/bleeding tendency.  Psychiatric/Behavioral: Negative for confusion.    PMFS History:  Patient Active Problem List   Diagnosis Date Noted  . 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 Iatrogenic hyperthyroidism 05/02/2020  . Dyslipidemia, goal LDL below 70 05/02/2020  . Acute combined systolic and diastolic CHF, NYHA class 3 ([place]) 09/23/2019  . Persistent atrial fibrillation ([place]) 09/21/2019  . Submucosal lesion of stomach 07/19/2019  . Abnormal CT of the abdomen 07/19/2019  . Abnormal CT scan, stomach 06/17/2019  . Advance directive declined by patient 01/14/2019  . Chronic anticoagulation 01/14/2019  . CRI (chronic renal insufficiency), stage 2 (mild) 01/14/2019  . Prediabetes 01/14/2019  . Persistent proteinuria 02/17/2018  . 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 Vitamin D deficiency 03/26/2017  . Medication monitoring encounter 03/26/2017  . Gout 10/08/2016  . Essential hypertension   . CAD S/P percutaneous coronary angioplasty   . Nonischemic cardiomyopathy ([place])   . Chest pain with moderate risk for cardiac etiology 08/02/2016  . Closed right ankle fracture 04/24/2015  . Ankle fracture, bimalleolar, closed 04/24/2015  . 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 OSA on CPAP 10/09/2013    Past Medical History:  Diagnosis Date  . Arthritis    rheumatoid  . CAD (coronary artery disease)    a. 1992 s/p MI and PTCA of unknown vessel;  b. 09/2010 Cath: LM nl, LAD 20p, LCX 73m, RCA  31m, RPL 20.  Marland Kitchen Cervical cancer ([place]) 1977  . Family history of anesthesia complication    daughter has difficulty waking   . GERD (gastroesophageal reflux disease)    occ  . Gout 10/08/2016  . Hyperlipidemia   . Hypertensive heart disease   . Hypothyroidism   .  Nonischemic cardiomyopathy ([place])    a. 07/2016 Echo: EF 40-45%, mild LVH, inferior akinesis, moderately dilated left atrium, trivial AI and MR.  Marland Kitchen PAF (paroxysmal atrial fibrillation) ([place])    a. 07/2016 Admitted w/ AF RVR-->CHA2DS2VASc = 5-->Eliquis;  b. 07/2016 successful TEE/DCCV.  Marland Kitchen Sleep apnea    a. Using CPAP.    Family History  Problem Relation Age of Onset  . Heart disease Mother   . Sudden death Mother   . Diabetes Father   . Stroke Father   . Bone cancer Sister   . Sudden death Brother   . Melanoma Brother   . Heart disease Brother   . Colon cancer Neg Hx   . Esophageal cancer Neg Hx   . Inflammatory bowel disease Neg Hx   . Liver disease Neg Hx   . Pancreatic cancer Neg Hx   . Rectal cancer Neg Hx   . Stomach cancer Neg Hx    Past Surgical History:  Procedure Laterality Date  . ABDOMINAL HYSTERECTOMY  1988  . BACK SURGERY  1994  . BIOPSY  09/02/2019   Procedure: BIOPSY;  Surgeon: Rush Landmark Telford Nab., MD;  Location: [facility];  Service: Gastroenterology;;  . BIOPSY  12/09/2019   Procedure: BIOPSY;  Surgeon: Irving Copas., MD;  Location: [facility];  Service: Gastroenterology;;  . [place]   PTCA BY DR Lamount Cohen  . CARDIOVERSION N/A 08/01/2016   Procedure: CARDIOVERSION;  Surgeon: Lelon Perla, MD;  Location: [facility] END[facility]COPY;  Service: Cardiovascular;  Laterality: N/A;  . CARDIOVERSION N/A 01/11/2020   Procedure: CARDIOVERSION;  Surgeon: Josue Hector, MD;  Location: [facility] END[facility]COPY;  Service: Cardiovascular;  Laterality: N/A;  . CARDIOVERSION N/A 07/08/2020   Procedure: CARDIOVERSION;  Surgeon: [name], MD;  Location: [place];  Service: Cardiovascular;  Laterality: N/A;  . ENDOSCOPIC MUCOSAL RESECTION N/A 12/09/2019   Procedure: ENDOSCOPIC MUCOSAL RESECTION;  Surgeon: Rush Landmark Telford Nab., MD;  Location: [facility];  Service: Gastroenterology;  Laterality: N/A;  . ESOPHAGOGASTRODUODENOSCOPY  (EGD) WITH PROPOFOL N/A 09/02/2019   Procedure: ESOPHAGOGASTRODUODENOSCOPY (EGD) WITH PROPOFOL;  Surgeon: Rush Landmark Telford Nab., MD;  Location: [facility];  Service: Gastroenterology;  Laterality: N/A;  . ESOPHAGOGASTRODUODENOSCOPY (EGD) WITH PROPOFOL N/A 12/09/2019   Procedure: ESOPHAGOGASTRODUODENOSCOPY (EGD) WITH PROPOFOL;  Surgeon: Rush Landmark Telford Nab., MD;  Location: [facility];  Service: Gastroenterology;  Laterality: N/A;  . EUS N/A 09/02/2019   Procedure: UPPER ENDOSCOPIC ULTRASOUND (EUS) RADIAL;  Surgeon: Rush Landmark Telford Nab., MD;  Location: [facility];  Service: Gastroenterology;  Laterality: N/A;  EUS radial/linear  . EUS N/A 12/09/2019   Procedure: UPPER ENDOSCOPIC ULTRASOUND (EUS) RADIAL;  Surgeon: Irving Copas., MD;  Location: [facility];  Service: Gastroenterology;  Laterality: N/A;  . EUS  12/09/2019   Procedure: UPPER ENDOSCOPIC ULTRASOUND (EUS) LINEAR;  Surgeon: Irving Copas., MD;  Location: [facility];  Service: Gastroenterology;;  . FINE NEEDLE ASPIRATION  09/02/2019   Procedure: FINE NEEDLE ASPIRATION (FNA) LINEAR;  Surgeon: Irving Copas., MD;  Location: [facility];  Service: Gastroenterology;;  . FINE NEEDLE ASPIRATION  12/09/2019   Procedure: FINE NEEDLE ASPIRATION (FNA) LINEAR;  Surgeon: Irving Copas., MD;  Location: [facility];  Service: Gastroenterology;;  . HEMOSTASIS CLIP PLACEMENT  12/09/2019   Procedure: HEMOSTASIS CLIP PLACEMENT;  Surgeon: Irving Copas., MD;  Location: [facility];  Service: Gastroenterology;;  . ORIF ANKLE FRACTURE Right 04/27/2015   Procedure: OPEN REDUCTION INTERNAL FIXATION (ORIF) RIGHT BIMALLEOLAR ANKLE FRACTURE WITH SYNDESMOSIS FIXATION;  Surgeon: Leandrew Koyanagi, MD;  Location: [place];  Service: Orthopedics;  Laterality: Right;  . SUBMUCOSAL LIFTING INJECTION  12/09/2019   Procedure: SUBMUCOSAL LIFTING INJECTION;  Surgeon: Rush Landmark Telford Nab., MD;  Location: [facility];   Service: Gastroenterology;;  . TEE WITHOUT CARDIOVERSION N/A 08/01/2016   Procedure: TRANSESOPHAGEAL ECHOCARDIOGRAM (TEE);  Surgeon: Lelon Perla, MD;  Location: [facility] ENDOSCOPY;  Service: Cardiovascular;  Laterality: N/A;  . THYROIDECTOMY  11/24/2013   DR Dalbert Batman  . THYROIDECTOMY N/A 11/24/2013   Procedure: TOTAL THYROIDECTOMY;  Surgeon: Adin Hector, MD;  Location: [place];  Service: General;  Laterality: N/A;  . TRANSTHORACIC ECHOCARDIOGRAM  01/15/2013   EF 55% TO 65%. PROBABLE MILD HYPOKINESIS OF THE INFERIOR MYOCARDIUM. GRADE 1 DIASTOLIC DYSFUNCTION. TRIAL AR.LA IS MILDLY DILATED.   Social History   Social History Narrative  . Not on file   Immunization History  Administered Date(s) Administered  . Fluad Quad(high Dose 65+) 08/22/2019  . Influenza Nasal 11/25/2013  . Influenza, High Dose Seasonal PF 12/30/2014, 10/14/2017, 11/01/2018  . Influenza,inj,Quad PF,6+ Mos 11/25/2013  . Influenza-Unspecified 12/30/2012, 11/09/2015, 12/24/2016, 09/16/2020  . PFIZER SARS-COV-2 Vaccination 12/29/2019, 01/18/2020, 09/16/2020  . Pneumococcal Conjugate-13 12/30/2014, 10/14/2017  . Pneumococcal Polysaccharide-23 11/25/2013, 08/22/2019  . Zoster 12/15/2008  . Zoster Recombinat (Shingrix) 10/14/2017, 01/21/2018     Objective: Vital Signs: BP (!) 164/96 (BP Location: Right Arm, Patient Position: Sitting, Cuff Size: Normal)   Pulse 61   Resp 15   Ht 5\' 5"  (1.651 m)   Wt 219 lb 9.6 oz (99.6 kg)   BMI 36.54 kg/m    Physical Exam Vitals and nursing note reviewed.  Constitutional:      Appearance: She is well-developed.  HENT:     Head: Normocephalic and atraumatic.  Eyes:     Conjunctiva/sclera: Conjunctivae normal.  Pulmonary:     Effort: Pulmonary effort is normal.  Abdominal:     Palpations: Abdomen is soft.  Musculoskeletal:     Cervical back: Normal range of motion.  Skin:    General: Skin is warm and dry.     Capillary Refill: Capillary refill takes less than 2 seconds.    Neurological:     Mental Status: She is alert and oriented to person, place, and time.  Psychiatric:        Behavior: Behavior normal.      Musculoskeletal Exam: C-spine, thoracic spine, and lumbar spine good ROM.  Midline spinal tenderness in the lumbar region.  No SI joint tenderness.  Shoulder joints, elbow joints, wrist joints, MCPs, PIPs, and DIPs good ROM with no synovitis.  Complete fist formation bilaterally.  Hip joints, knee joints, and ankle joints good ROM.  No warmth or effusion of knee joints.  Pedal edema bilaterally.   CDAI Exam: CDAI Score: -- Patient Global: --; Provider Global: -- Swollen: --; Tender: -- Joint Exam 10/18/2020   No joint exam has been documented for this visit   There is currently no information documented on the homunculus. Go to the Rheumatology activity and complete the homunculus joint exam.  Investigation: No additional findings.  Imaging: No results found.  Recent Labs: Lab Results  Component Value Date  WBC 4.0 09/16/2020   HGB 14.7 09/16/2020   PLT 235 09/16/2020   NA 141 09/16/2020   K 4.3 09/16/2020   CL 100 09/16/2020   CO2 27 09/16/2020   GLUCOSE 87 09/16/2020   BUN 16 09/16/2020   CREATININE 0.90 09/16/2020   BILITOT 0.4 09/16/2020   ALKPHOS 106 09/16/2020   AST 17 09/16/2020   ALT 11 09/16/2020   PROT 7.6 09/16/2020   ALBUMIN 4.6 09/16/2020   CALCIUM 10.0 09/16/2020   GFRAA 74 09/16/2020    Speciality Comments: No specialty comments available.  Procedures:  No procedures performed Allergies: Labetalol and Codeine   Assessment / Plan:     Visit Diagnoses: Idiopathic chronic gout of multiple sites without tophus -She has not had any recent gout flares.  She is clinically doing well on allopurinol 100 mg 1 tablet by mouth daily.  She has not missed any doses of allopurinol recently and has been tolerating it without any side effects.  Her uric acid level was 3.5 on 04/21/2020.  We will recheck her uric acid level  today.  She will continue taking allopurinol as prescribed.  She does not need any refills at this time.  She was advised to notify us if she develops signs or symptoms of a gout flare.  She will follow-up in the office in 6 months.- Plan: Uric acid  Medication monitoring encounter -Allopurinol 100 mg 1 tablet by mouth daily.  We will check uric acid level today.  Plan: Uric acid  Chronic pain of both shoulders: Resolved.  She has good range of motion of both shoulder joints with no discomfort or joint tenderness.  She is no longer experiencing nocturnal pain.  She had a right shoulder joint cortisone injection performed on 04/15/2020 which provided significant pain relief.  Hyperuricemia: Uric acid was 3.5 on 04/21/2020.  We will recheck uric acid level today.  She will continue taking allopurinol 100 mg 1 tablet by mouth daily.  History of vitamin D deficiency: She is taking vitamin D 1,000 units daily.   Weight loss counseling, encounter for: She has noticed progressive weight gain since her last visit.  She attributes the weight gain to diet as well as being more sedentary during the COVID-19 pandemic.  We discussed the importance of portion control, increasing water intake, and making necessary dietary modifications.  We also discussed the importance of exercise.  She has been experiencing increased pain in both knee joints in her lower back which she attributes to weight gain.  She was given knee joint and lower back stretching and strengthening exercises to perform.  She was strongly encouraged to schedule appointment at the [facility] weight loss clinic and was given a handout of information to contact their office.  Other medical conditions are listed as follows:   OSA on CPAP  History of coronary artery disease  History of hyperlipidemia  History of hypothyroidism  History of atrial fibrillation  History of hypertension    Orders: Orders Placed This Encounter  Procedures  . Uric  acid   No orders of the defined types were placed in this encounter.     Follow-Up Instructions: Return in about 6 months (around 04/17/2021) for Gout.   Ofilia Neas, PA-C  Note - This record has been created using Dragon software.  Chart creation errors have been sought, but may not always  have been located. Such creation errors do not reflect on  the standard of medical care.

## 2020-10-18 ENCOUNTER — Ambulatory Visit: Payer: PPO | Admitting: Rheumatology

## 2020-10-18 ENCOUNTER — Other Ambulatory Visit: Payer: Self-pay

## 2020-10-18 ENCOUNTER — Encounter: Payer: Self-pay | Admitting: Physician Assistant

## 2020-10-18 ENCOUNTER — Ambulatory Visit: Payer: PPO | Admitting: Physician Assistant

## 2020-10-18 VITALS — BP 164/96 | HR 61 | Resp 15 | Ht 65.0 in | Wt 219.6 lb

## 2020-10-18 DIAGNOSIS — M1A09X0 Idiopathic chronic gout, multiple sites, without tophus (tophi): Secondary | ICD-10-CM | POA: Diagnosis not present

## 2020-10-18 DIAGNOSIS — Z8639 Personal history of other endocrine, nutritional and metabolic disease: Secondary | ICD-10-CM

## 2020-10-18 DIAGNOSIS — M25512 Pain in left shoulder: Secondary | ICD-10-CM | POA: Diagnosis not present

## 2020-10-18 DIAGNOSIS — Z713 Dietary counseling and surveillance: Secondary | ICD-10-CM | POA: Diagnosis not present

## 2020-10-18 DIAGNOSIS — G4733 Obstructive sleep apnea (adult) (pediatric): Secondary | ICD-10-CM | POA: Diagnosis not present

## 2020-10-18 DIAGNOSIS — Z8679 Personal history of other diseases of the circulatory system: Secondary | ICD-10-CM | POA: Diagnosis not present

## 2020-10-18 DIAGNOSIS — M25511 Pain in right shoulder: Secondary | ICD-10-CM

## 2020-10-18 DIAGNOSIS — Z5181 Encounter for therapeutic drug level monitoring: Secondary | ICD-10-CM

## 2020-10-18 DIAGNOSIS — Z9989 Dependence on other enabling machines and devices: Secondary | ICD-10-CM

## 2020-10-18 DIAGNOSIS — G8929 Other chronic pain: Secondary | ICD-10-CM | POA: Diagnosis not present

## 2020-10-18 DIAGNOSIS — E790 Hyperuricemia without signs of inflammatory arthritis and tophaceous disease: Secondary | ICD-10-CM

## 2020-10-18 NOTE — Patient Instructions (Signed)
Core Strength Exercises  Core exercises help to build strength in the muscles between your ribs and your hips (abdominal muscles). These muscles help to support your body and keep your spine stable. It is important to maintain strength in your core to prevent injury and pain. Some activities, such as yoga and Pilates, can help to strengthen core muscles. You can also strengthen core muscles with exercises at home. It is important to talk to your health care provider before you start a new exercise routine. What are the benefits of core strength exercises? Core strength exercises can:  Reduce back pain.  Help to rebuild strength after a back or spine injury.  Help to prevent injury during physical activity, especially injuries to the back and knees. How to do core strength exercises Repeat these exercises 10-15 times, or until you are tired. Do exercises exactly as told by your health care provider and adjust them as directed. It is normal to feel mild stretching, pulling, tightness, or discomfort as you do these exercises. If you feel any pain while doing these exercises, stop. If your pain continues or gets worse when doing core exercises, contact your health care provider. You may want to use a padded yoga or exercise mat for strength exercises that are done on the floor. Bridging  1. Lie on your back on a firm surface with your knees bent and your feet flat on the floor. 2. Raise your hips so that your knees, hips, and shoulders form a straight line together. Keep your abdominal muscles tight. 3. Hold this position for 3-5 seconds. 4. Slowly lower your hips to the starting position. 5. Let your muscles relax completely between repetitions. Single-leg bridge 1. Lie on your back on a firm surface with your knees bent and your feet flat on the floor. 2. Raise your hips so that your knees, hips, and shoulders form a straight line together. Keep your abdominal muscles tight. 3. Lift one foot  off the floor, then completely straighten that leg. 4. Hold this position for 3-5 seconds. 5. Put the straight leg back down in the bent position. 6. Slowly lower your hips to the starting position. 7. Repeat these steps using your other leg. Side bridge 1. Lie on your side with your knees bent. Prop yourself up on the elbow that is near the floor. 2. Using your abdominal muscles and your elbow that is on the floor, raise your body off the floor. Raise your hip so that your shoulder, hip, and foot form a straight line together. 3. Hold this position for 10 seconds. Keep your head and neck raised and away from your shoulder (in their normal, neutral position). Keep your abdominal muscles tight. 4. Slowly lower your hip to the starting position. 5. Repeat and try to hold this position longer, working your way up to 30 seconds. Abdominal crunch 1. Lie on your back on a firm surface. Bend your knees and keep your feet flat on the floor. 2. Cross your arms over your chest. 3. Without bending your neck, tip your chin slightly toward your chest. 4. Tighten your abdominal muscles as you lift your chest just high enough to lift your shoulder blades off of the floor. Do not hold your breath. You can do this with short lifts or long lifts. 5. Slowly return to the starting position. Bird dog 1. Get on your hands and knees, with your legs shoulder-width apart and your arms under your shoulders. Keep your back straight. 2. Tighten  your abdominal muscles. 3. Raise one of your legs off the floor and straighten it. Try to keep it parallel to the floor. 4. Slowly lower your leg to the starting position. 5. Raise one of your arms off the floor and straighten it. Try to keep it parallel to the floor. 6. Slowly lower your arm to the starting position. 7. Repeat with the other arm and leg. If possible, try raising a leg and arm at the same time, on opposite sides of the body. For example, raise your left hand and  your right leg. Plank 1. Lie on your belly. 2. Prop up your body onto your forearms and your feet, keeping your legs straight. Your body should make a straight line between your shoulders and feet. 3. Hold this position for 10 seconds while keeping your abdominal muscles tight. 4. Lower your body to the starting position. 5. Repeat and try to hold this position longer, working your way up to 30 seconds. Cross-core strengthening 1. Stand with your feet shoulder-width apart. 2. Hold a ball out in front of you. Keep your arms straight. 3. Tighten your abdominal muscles and slowly rotate at your waist from side to side. Keep your feet flat. 4. Once you are comfortable, try repeating this exercise with a heavier ball. Top core strengthening 1. Stand about 18 inches (46 cm) in front of a wall, with your back to the wall. 2. Keep your feet flat and shoulder-width apart. 3. Tighten your abdominal muscles. 4. Bend your hips and knees. 5. Slowly reach between your legs to touch the wall behind you. 6. Slowly stand back up. 7. Raise your arms over your head and reach behind you. 8. Return to the starting position. General tips  Do not do any exercises that cause pain. If you have pain while exercising, talk to your health care provider.  Always stretch before and after doing these exercises. This can help prevent injury.  Maintain a healthy weight. Ask your health care provider what weight is healthy for you. Contact a health care provider if:  You have back pain that gets worse or does not go away.  You feel pain while doing core strength exercises. Get help right away if:  You have severe pain that does not get better with medicine. Summary  Core exercises help to build strength in the muscles between your ribs and your waist.  Core muscles help to support your body and keep your spine stable.  Some activities, such as yoga and Pilates, can help to strengthen core muscles.  Core  strength exercises can help back pain and can prevent injury.  If you feel any pain while doing core strength exercises, stop. This information is not intended to replace advice given to you by your health care provider. Make sure you discuss any questions you have with your health care provider. Document Revised: 03/18/2019 Document Reviewed: 04/17/2017 Elsevier Patient Education  [place]. Back Exercises These exercises help to make your trunk and back strong. They also help to keep the lower back flexible. Doing these exercises can help to prevent back pain or lessen existing pain.  If you have back pain, try to do these exercises 2-3 times each day or as told by your doctor.  As you get better, do the exercises once each day. Repeat the exercises more often as told by your doctor.  To stop back pain from coming back, do the exercises once each day, or as told by your  doctor. Exercises Single knee to chest Do these steps 3-5 times in a row for each leg: 1. Lie on your back on a firm bed or the floor with your legs stretched out. 2. Bring one knee to your chest. 3. Grab your knee or thigh with both hands and hold them it in place. 4. Pull on your knee until you feel a gentle stretch in your lower back or buttocks. 5. Keep doing the stretch for 10-30 seconds. 6. Slowly let go of your leg and straighten it. Pelvic tilt Do these steps 5-10 times in a row: 1. Lie on your back on a firm bed or the floor with your legs stretched out. 2. Bend your knees so they point up to the ceiling. Your feet should be flat on the floor. 3. Tighten your lower belly (abdomen) muscles to press your lower back against the floor. This will make your tailbone point up to the ceiling instead of pointing down to your feet or the floor. 4. Stay in this position for 5-10 seconds while you gently tighten your muscles and breathe evenly. Cat-cow Do these steps until your lower back bends more  easily: 1. Get on your hands and knees on a firm surface. Keep your hands under your shoulders, and keep your knees under your hips. You may put padding under your knees. 2. Let your head hang down toward your chest. Tighten (contract) the muscles in your belly. Point your tailbone toward the floor so your lower back becomes rounded like the back of a cat. 3. Stay in this position for 5 seconds. 4. Slowly lift your head. Let the muscles of your belly relax. Point your tailbone up toward the ceiling so your back forms a sagging arch like the back of a cow. 5. Stay in this position for 5 seconds.  Press-ups Do these steps 5-10 times in a row: 1. Lie on your belly (face-down) on the floor. 2. Place your hands near your head, about shoulder-width apart. 3. While you keep your back relaxed and keep your hips on the floor, slowly straighten your arms to raise the top half of your body and lift your shoulders. Do not use your back muscles. You may change where you place your hands in order to make yourself more comfortable. 4. Stay in this position for 5 seconds. 5. Slowly return to lying flat on the floor.  Bridges Do these steps 10 times in a row: 1. Lie on your back on a firm surface. 2. Bend your knees so they point up to the ceiling. Your feet should be flat on the floor. Your arms should be flat at your sides, next to your body. 3. Tighten your butt muscles and lift your butt off the floor until your waist is almost as high as your knees. If you do not feel the muscles working in your butt and the back of your thighs, slide your feet 1-2 inches farther away from your butt. 4. Stay in this position for 3-5 seconds. 5. Slowly lower your butt to the floor, and let your butt muscles relax. If this exercise is too easy, try doing it with your arms crossed over your chest. Belly crunches Do these steps 5-10 times in a row: 1. Lie on your back on a firm bed or the floor with your legs stretched  out. 2. Bend your knees so they point up to the ceiling. Your feet should be flat on the floor. 3. Cross your arms over  your chest. 4. Tip your chin a little bit toward your chest but do not bend your neck. 5. Tighten your belly muscles and slowly raise your chest just enough to lift your shoulder blades a tiny bit off of the floor. Avoid raising your body higher than that, because it can put too much stress on your low back. 6. Slowly lower your chest and your head to the floor. Back lifts Do these steps 5-10 times in a row: 1. Lie on your belly (face-down) with your arms at your sides, and rest your forehead on the floor. 2. Tighten the muscles in your legs and your butt. 3. Slowly lift your chest off of the floor while you keep your hips on the floor. Keep the back of your head in line with the curve in your back. Look at the floor while you do this. 4. Stay in this position for 3-5 seconds. 5. Slowly lower your chest and your face to the floor. Contact a doctor if:  Your back pain gets a lot worse when you do an exercise.  Your back pain does not get better 2 hours after you exercise. If you have any of these problems, stop doing the exercises. Do not do them again unless your doctor says it is okay. Get help right away if:  You have sudden, very bad back pain. If this happens, stop doing the exercises. Do not do them again unless your doctor says it is okay. This information is not intended to replace advice given to you by your health care provider. Make sure you discuss any questions you have with your health care provider. Document Revised: 08/21/2018 Document Reviewed: 08/21/2018 Elsevier Patient Education  2020 [employer]ers, 15(4), [id number]. Retrieved September 15, 2018 from http://clinicalkey.com/nursing">  Knee Exercises Ask your health care provider which exercises are safe for you. Do exercises exactly as told by your health care provider and  adjust them as directed. It is normal to feel mild stretching, pulling, tightness, or discomfort as you do these exercises. Stop right away if you feel sudden pain or your pain gets worse. Do not begin these exercises until told by your health care provider. Stretching and range-of-motion exercises These exercises warm up your muscles and joints and improve the movement and flexibility of your knee. These exercises also help to relieve pain and swelling. Knee extension, prone 1. Lie on your abdomen (prone position) on a bed. 2. Place your left / right knee just beyond the edge of the surface so your knee is not on the bed. You can put a towel under your left / right thigh just above your kneecap for comfort. 3. Relax your leg muscles and allow gravity to straighten your knee (extension). You should feel a stretch behind your left / right knee. 4. Hold this position for __________ seconds. 5. Scoot up so your knee is supported between repetitions. Repeat __________ times. Complete this exercise __________ times a day. Knee flexion, active  7. Lie on your back with both legs straight. If this causes back discomfort, bend your left / right knee so your foot is flat on the floor. 8. Slowly slide your left / right heel back toward your buttocks. Stop when you feel a gentle stretch in the front of your knee or thigh (flexion). 9. Hold this position for __________ seconds. 10. Slowly slide your left / right heel back to the starting position. Repeat __________ times. Complete this exercise __________ times a  day. Quadriceps stretch, prone  5. Lie on your abdomen on a firm surface, such as a bed or padded floor. 6. Bend your left / right knee and hold your ankle. If you cannot reach your ankle or pant leg, loop a belt around your foot and grab the belt instead. 7. Gently pull your heel toward your buttocks. Your knee should not slide out to the side. You should feel a stretch in the front of your thigh  and knee (quadriceps). 8. Hold this position for __________ seconds. Repeat __________ times. Complete this exercise __________ times a day. Hamstring, supine 6. Lie on your back (supine position). 7. Loop a belt or towel over the ball of your left / right foot. The ball of your foot is on the walking surface, right under your toes. 8. Straighten your left / right knee and slowly pull on the belt to raise your leg until you feel a gentle stretch behind your knee (hamstring). ? Do not let your knee bend while you do this. ? Keep your other leg flat on the floor. 9. Hold this position for __________ seconds. Repeat __________ times. Complete this exercise __________ times a day. Strengthening exercises These exercises build strength and endurance in your knee. Endurance is the ability to use your muscles for a long time, even after they get tired. Quadriceps, isometric This exercise stretches the muscles in front of your thigh (quadriceps) without moving your knee joint (isometric). 6. Lie on your back with your left / right leg extended and your other knee bent. Put a rolled towel or small pillow under your knee if told by your health care provider. 7. Slowly tense the muscles in the front of your left / right thigh. You should see your kneecap slide up toward your hip or see increased dimpling just above the knee. This motion will push the back of the knee toward the floor. 8. For __________ seconds, hold the muscle as tight as you can without increasing your pain. 9. Relax the muscles slowly and completely. Repeat __________ times. Complete this exercise __________ times a day. Straight leg raises This exercise stretches the muscles in front of your thigh (quadriceps) and the muscles that move your hips (hip flexors). 6. Lie on your back with your left / right leg extended and your other knee bent. 7. Tense the muscles in the front of your left / right thigh. You should see your kneecap slide  up or see increased dimpling just above the knee. Your thigh may even shake a bit. 8. Keep these muscles tight as you raise your leg 4-6 inches (10-15 cm) off the floor. Do not let your knee bend. 9. Hold this position for __________ seconds. 10. Keep these muscles tense as you lower your leg. 11. Relax your muscles slowly and completely after each repetition. Repeat __________ times. Complete this exercise __________ times a day. Hamstring, isometric 7. Lie on your back on a firm surface. 8. Bend your left / right knee about __________ degrees. 9. Dig your left / right heel into the surface as if you are trying to pull it toward your buttocks. Tighten the muscles in the back of your thighs (hamstring) to "dig" as hard as you can without increasing any pain. 10. Hold this position for __________ seconds. 11. Release the tension gradually and allow your muscles to relax completely for __________ seconds after each repetition. Repeat __________ times. Complete this exercise __________ times a day. Hamstring curls If told by  your health care provider, do this exercise while wearing ankle weights. Begin with __________ lb weights. Then increase the weight by 1 lb (0.5 kg) increments. Do not wear ankle weights that are more than __________ lb. 6. Lie on your abdomen with your legs straight. 7. Bend your left / right knee as far as you can without feeling pain. Keep your hips flat against the floor. 8. Hold this position for __________ seconds. 9. Slowly lower your leg to the starting position. Repeat __________ times. Complete this exercise __________ times a day. Squats This exercise strengthens the muscles in front of your thigh and knee (quadriceps). 1. Stand in front of a table, with your feet and knees pointing straight ahead. You may rest your hands on the table for balance but not for support. 2. Slowly bend your knees and lower your hips like you are going to sit in a chair. ? Keep your  weight over your heels, not over your toes. ? Keep your lower legs upright so they are parallel with the table legs. ? Do not let your hips go lower than your knees. ? Do not bend lower than told by your health care provider. ? If your knee pain increases, do not bend as low. 3. Hold the squat position for __________ seconds. 4. Slowly push with your legs to return to standing. Do not use your hands to pull yourself to standing. Repeat __________ times. Complete this exercise __________ times a day. Wall slides This exercise strengthens the muscles in front of your thigh and knee (quadriceps). 1. Lean your back against a smooth wall or door, and walk your feet out 18-24 inches (46-61 cm) from it. 2. Place your feet hip-width apart. 3. Slowly slide down the wall or door until your knees bend __________ degrees. Keep your knees over your heels, not over your toes. Keep your knees in line with your hips. 4. Hold this position for __________ seconds. Repeat __________ times. Complete this exercise __________ times a day. Straight leg raises This exercise strengthens the muscles that rotate the leg at the hip and move it away from your body (hip abductors). 1. Lie on your side with your left / right leg in the top position. Lie so your head, shoulder, knee, and hip line up. You may bend your bottom knee to help you keep your balance. 2. Roll your hips slightly forward so your hips are stacked directly over each other and your left / right knee is facing forward. 3. Leading with your heel, lift your top leg 4-6 inches (10-15 cm). You should feel the muscles in your outer hip lifting. ? Do not let your foot drift forward. ? Do not let your knee roll toward the ceiling. 4. Hold this position for __________ seconds. 5. Slowly return your leg to the starting position. 6. Let your muscles relax completely after each repetition. Repeat __________ times. Complete this exercise __________ times a  day. Straight leg raises This exercise stretches the muscles that move your hips away from the front of the pelvis (hip extensors). 1. Lie on your abdomen on a firm surface. You can put a pillow under your hips if that is more comfortable. 2. Tense the muscles in your buttocks and lift your left / right leg about 4-6 inches (10-15 cm). Keep your knee straight as you lift your leg. 3. Hold this position for __________ seconds. 4. Slowly lower your leg to the starting position. 5. Let your leg relax completely after  each repetition. Repeat __________ times. Complete this exercise __________ times a day. This information is not intended to replace advice given to you by your health care provider. Make sure you discuss any questions you have with your health care provider. Document Revised: 09/16/2018 Document Reviewed: 09/16/2018 Elsevier Patient Education  2020 [employer].

## 2020-10-19 LAB — URIC ACID: Uric Acid, Serum: 4.5 mg/dL (ref 2.5–7.0)

## 2020-10-19 NOTE — Progress Notes (Signed)
Uric acid is within the desirable range. No change in therapy at this time.

## 2020-10-31 DIAGNOSIS — R809 Proteinuria, unspecified: Secondary | ICD-10-CM | POA: Diagnosis not present

## 2020-11-08 DIAGNOSIS — K319 Disease of stomach and duodenum, unspecified: Secondary | ICD-10-CM | POA: Diagnosis not present

## 2020-11-08 DIAGNOSIS — I129 Hypertensive chronic kidney disease with stage 1 through stage 4 chronic kidney disease, or unspecified chronic kidney disease: Secondary | ICD-10-CM | POA: Diagnosis not present

## 2020-11-08 DIAGNOSIS — R809 Proteinuria, unspecified: Secondary | ICD-10-CM | POA: Diagnosis not present

## 2020-11-08 DIAGNOSIS — N183 Chronic kidney disease, stage 3 unspecified: Secondary | ICD-10-CM | POA: Diagnosis not present

## 2020-11-08 DIAGNOSIS — I480 Paroxysmal atrial fibrillation: Secondary | ICD-10-CM | POA: Diagnosis not present

## 2020-11-08 DIAGNOSIS — M109 Gout, unspecified: Secondary | ICD-10-CM | POA: Diagnosis not present

## 2020-11-08 DIAGNOSIS — E559 Vitamin D deficiency, unspecified: Secondary | ICD-10-CM | POA: Diagnosis not present

## 2020-11-25 ENCOUNTER — Other Ambulatory Visit: Payer: Self-pay | Admitting: Cardiovascular Disease

## 2020-11-29 ENCOUNTER — Telehealth: Payer: Self-pay | Admitting: Cardiovascular Disease

## 2020-11-29 MED ORDER — AMLODIPINE BESYLATE 5 MG PO TABS
7.5000 mg | ORAL_TABLET | Freq: Every evening | ORAL | 3 refills | Status: DC
Start: 2020-11-29 — End: 2021-03-15

## 2020-11-29 NOTE — Telephone Encounter (Signed)
   *  STAT* If patient is at the pharmacy, call can be transferred to refill team.   1. Which medications need to be refilled? (please list name of each medication and dose if known)   amLODipine (NORVASC) 5 MG tablet    2. Which pharmacy/location (including street and city if local pharmacy) is medication to be sent to? [place], [place]  3. Do they need a 30 day or 90 day supply? 90 days   Pt is out of medication

## 2020-12-28 DIAGNOSIS — N183 Chronic kidney disease, stage 3 unspecified: Secondary | ICD-10-CM | POA: Diagnosis not present

## 2020-12-30 ENCOUNTER — Ambulatory Visit: Payer: PPO | Admitting: Family Medicine

## 2021-01-12 ENCOUNTER — Inpatient Hospital Stay: Payer: PPO | Admitting: Oncology

## 2021-01-19 NOTE — Progress Notes (Signed)
   Subjective:    Patient ID: Claudia Cantrell, female    DOB: 11/11/1946, 75 y.o.   MRN: [id number]  HPI Chief Complaint  Patient presents with  . Prediabetes    prediabetes, no concerns. Does not check blood sugars   She is here to follow up on prediabetes. Her last Hgb A1c was 6.0% Taking Wilder Glade now, started by her nephrologist. Doing well on it.  Reports feeling better overall since starting on this medicine.  HTN- taking all BP at home without any concerns. Followed by nephrologist and cardiologist   Obesity-weight has been stable.  States she has been eating more carbs than usual and has been eating a lot of fruit.  States she eats grapes and bananas daily.  She is also having orange juice.  States her physical activity has been poor lately.  She would like to lose weight.  States she has done it before and knows how to do it.  HL- taking statin daily    Recent labs done at nephrologist per patient.  She will see Dr. Carolin Sicks again in a few weeks.   Denies fever, chills, dizziness, chest pain, palpitations, shortness of breath, abdominal pain, N/V/D, urinary symptoms, LE edema.   Reviewed allergies, medications, past medical, surgical, family, and social history.   Review of Systems Pertinent positives and negatives in the history of present illness.     Objective:   Physical Exam BP 120/80   Pulse 63   Wt 220 lb 3.2 oz (99.9 kg)   BMI 36.64 kg/m   Alert and in no distress. Cardiac exam shows a regular rhythm without murmurs or gallops. Lungs are clear to auscultation. Extremities without edema. Skin is warm and dry. Normal speech and mood.        Assessment & Plan:  Prediabetes - Plan: HgB A1c -Her hemoglobin A1c is 5.8% and improved since her last visit.  Discussed fruit has a lot of sugar in it.  Recommend she cut back on carbohydrates and fruit.  Continue on Farxiga that was started by her nephrologist.  Increase her physical activity  Essential  hypertension -Continue current medication.  Blood pressure today in goal range.  Continue seeing her specialists including nephrology and cardiology.  Chronic anticoagulation -No signs of bleeding  Dyslipidemia, goal LDL below 70 -Continue statin therapy

## 2021-01-20 ENCOUNTER — Ambulatory Visit (INDEPENDENT_AMBULATORY_CARE_PROVIDER_SITE_OTHER): Payer: PPO | Admitting: Family Medicine

## 2021-01-20 ENCOUNTER — Other Ambulatory Visit: Payer: Self-pay

## 2021-01-20 ENCOUNTER — Encounter: Payer: Self-pay | Admitting: Family Medicine

## 2021-01-20 VITALS — BP 120/80 | HR 63 | Wt 220.2 lb

## 2021-01-20 DIAGNOSIS — I10 Essential (primary) hypertension: Secondary | ICD-10-CM | POA: Diagnosis not present

## 2021-01-20 DIAGNOSIS — Z7901 Long term (current) use of anticoagulants: Secondary | ICD-10-CM | POA: Diagnosis not present

## 2021-01-20 DIAGNOSIS — R7303 Prediabetes: Secondary | ICD-10-CM

## 2021-01-20 DIAGNOSIS — E785 Hyperlipidemia, unspecified: Secondary | ICD-10-CM

## 2021-01-20 LAB — POCT GLYCOSYLATED HEMOGLOBIN (HGB A1C): Hemoglobin A1C: 5.8 % — AB (ref 4.0–5.6)

## 2021-01-20 NOTE — Patient Instructions (Signed)
Your hemoglobin A1c today is 5.8% which is lower than your last visit.  Continue on all of your medications.  Try to cut back on grapes, bananas and oranges.  It is okay to have them occasionally but try to not eat them daily or in large amounts. I recommend increasing your physical activity as tolerated.  I will see you back when you are due for your Medicare wellness and physical exam.  Come in fasting so we can check your cholesterol if it has not been checked by then.  Continue seeing your therapists.

## 2021-01-30 ENCOUNTER — Inpatient Hospital Stay: Payer: PPO | Attending: Oncology | Admitting: Nurse Practitioner

## 2021-01-30 ENCOUNTER — Telehealth: Payer: Self-pay | Admitting: Nurse Practitioner

## 2021-01-30 ENCOUNTER — Encounter: Payer: Self-pay | Admitting: Nurse Practitioner

## 2021-01-30 ENCOUNTER — Other Ambulatory Visit: Payer: Self-pay

## 2021-01-30 VITALS — BP 106/52 | HR 70 | Temp 98.3°F | Resp 18 | Ht 65.0 in | Wt 223.9 lb

## 2021-01-30 DIAGNOSIS — C49A2 Gastrointestinal stromal tumor of stomach: Secondary | ICD-10-CM | POA: Insufficient documentation

## 2021-01-30 DIAGNOSIS — I252 Old myocardial infarction: Secondary | ICD-10-CM | POA: Insufficient documentation

## 2021-01-30 DIAGNOSIS — Z8541 Personal history of malignant neoplasm of cervix uteri: Secondary | ICD-10-CM | POA: Insufficient documentation

## 2021-01-30 DIAGNOSIS — M069 Rheumatoid arthritis, unspecified: Secondary | ICD-10-CM | POA: Diagnosis not present

## 2021-01-30 DIAGNOSIS — I2510 Atherosclerotic heart disease of native coronary artery without angina pectoris: Secondary | ICD-10-CM | POA: Diagnosis not present

## 2021-01-30 DIAGNOSIS — Z79899 Other long term (current) drug therapy: Secondary | ICD-10-CM | POA: Insufficient documentation

## 2021-01-30 DIAGNOSIS — I4891 Unspecified atrial fibrillation: Secondary | ICD-10-CM | POA: Insufficient documentation

## 2021-01-30 DIAGNOSIS — G4730 Sleep apnea, unspecified: Secondary | ICD-10-CM | POA: Diagnosis not present

## 2021-01-30 DIAGNOSIS — E039 Hypothyroidism, unspecified: Secondary | ICD-10-CM | POA: Diagnosis not present

## 2021-01-30 NOTE — Progress Notes (Signed)
  [facility] OFFICE PROGRESS NOTE   Diagnosis: Gastrointestinal stromal tumor of the stomach  INTERVAL HISTORY:   Ms. Velador returns for follow-up.  Overall she is feeling well.  She reports a good appetite, weight gain.  No abdominal pain.  She has periodic acid reflux, not consistently.  No nausea or vomiting.  No change in bowel habits.  Some joint pain/stiffness.  Objective:  Vital signs in last 24 hours:  Blood pressure (!) 106/52, pulse 70, temperature 98.3 F (36.8 C), temperature source Tympanic, resp. rate 18, height _0  (1.651 m), weight 223 lb 14.4 oz (101.6 kg), SpO2 98 %.    HEENT: Neck without mass. Lymphatics: No palpable cervical, supraclavicular or axillary lymph nodes.  Soft fullness bilateral supraclavicular region, no discrete mass. Resp: Lungs clear bilaterally. Cardio: Regular rate and rhythm. GI: Abdomen soft and nontender.  No hepatomegaly.  No mass. Vascular: No leg edema.    Lab Results:  Lab Results  Component Value Date   WBC 4.0 09/16/2020   HGB 14.7 09/16/2020   HCT 44.5 09/16/2020   MCV 91 09/16/2020   PLT 235 09/16/2020   NEUTROABS 2.1 09/16/2020    Imaging:  No results found.  Medications: I have reviewed the patient's current medications.  Assessment/Plan: 1. Gastrointestinal stromal tumor of the stomach  CT abdomen/pelvis 05/07/2019-enhancing soft tissue nodule, 1.4 cm, at the serosal surface of the distal gastric body  EUS 09/02/2019-subepithelial lesion at the greater curvature of the stomach originate from the muscularis layer 15 mm in thickness and 12 mm in diameter, FNA biopsy-nondiagnostic, no malignant lymph nodes  EUS 12/09/2019-subepithelial lesion at the body of the stomach, originating from muscularis propria, 14.8 mm in thickness, 14.4 mm in diameter, fine-needle biopsy-spindle cell neoplasm, CD34 and CD117 positive consistent with a gastrointestinal stromal 2.  H pylori on stomach biopsy 09/02/2019 3.   History of coronary artery disease, status post myocardial infarction 4.  History of atrial fibrillation, status post cardioversion 01/11/2020 5.  Rheumatoid arthritis 6.  Sleep apnea 7.  Cervical cancer 1977 8.  Hypothyroid   Disposition: Ms. Raczkowski appears stable.  There is no clinical evidence of progression of the gastrointestinal stromal tumor of the stomach.  She forgot about needing to make an appointment with Dr. Rush Landmark for a follow-up EUS.  We made a referral to Dr. Rush Landmark today.  We also referred her for a follow-up CT scan of the abdomen.  We will contact her with the CT results.  We scheduled a follow-up visit in 1 year but are available to see her sooner if needed.  Plan reviewed with Dr. Benay Spice.    Ned Card ANP/GNP-BC   01/30/2021  3:04 PM

## 2021-01-30 NOTE — Telephone Encounter (Signed)
Scheduled appointments per 2/21 los. Spoke to patient who is aware of appointments dates and times. Gave patient calendar print out.  

## 2021-02-01 ENCOUNTER — Telehealth: Payer: Self-pay | Admitting: Gastroenterology

## 2021-02-01 NOTE — Telephone Encounter (Signed)
I think that the patient's recall came through that I saw just a few days ago. She can be scheduled for next available EUS.  Thanks. GM

## 2021-02-01 NOTE — Telephone Encounter (Signed)
Dr Mansouraty please advise  

## 2021-02-01 NOTE — Telephone Encounter (Signed)
Hi Patty, we have received a referral from [facility] for pt to have an EUS. It looks like pt is due for a repeat EUS per recall tab. Thank you.

## 2021-02-02 ENCOUNTER — Other Ambulatory Visit: Payer: Self-pay

## 2021-02-02 ENCOUNTER — Telehealth: Payer: Self-pay

## 2021-02-02 DIAGNOSIS — D214 Benign neoplasm of connective and other soft tissue of abdomen: Secondary | ICD-10-CM

## 2021-02-02 NOTE — Telephone Encounter (Signed)
[facility] Pre-operative Risk Assessment     Request for surgical clearance:     Endoscopy Procedure  What type of surgery is being performed?     EUS  When is this surgery scheduled?     03/08/21  What type of clearance is required ?   Pharmacy  Are there any medications that need to be held prior to surgery and how long? Eliquis  Practice name and name of physician performing surgery?      [facility]  What is your office phone and fax number?      Phone- [phone]  [phone]  Anesthesia type (None, local, MAC, general) ?       MAC

## 2021-02-02 NOTE — Telephone Encounter (Signed)
The pt has been scheduled for an appt for EUS with Dr Rush Landmark on 03/08/21 at 9 am at [facility].  COVID test on 03/04/21 at 1020 am.    EUS scheduled, pt instructed and medications reviewed.  Patient instructions mailed to home.  Patient to call with any questions or concerns.  Anti Coag encounter sent to Dr Claiborne Billings.

## 2021-02-02 NOTE — Telephone Encounter (Signed)
The pt has been advised that she can hold Eliquis 2 days prior to the procedure.

## 2021-02-02 NOTE — Telephone Encounter (Signed)
Please inform the patient to hold Eliquis for 2 days prior to GI procedure.

## 2021-02-02 NOTE — Telephone Encounter (Signed)
Patient with diagnosis of afib on Eliquis for anticoagulation.    Procedure: endoscopy Date of procedure: 03/08/21  CHA2DS2-VASc Score = 5  This indicates a 7.2% annual risk of stroke. The patient's score is based upon: CHF History: Yes HTN History: Yes Diabetes History: No Stroke History: No Vascular Disease History: Yes Age Score: 1 Gender Score: 1  CrCl 70mL/min using adjusted body weight Platelet count 235K  Per office protocol, patient can hold Eliquis for 2 days prior to procedure.

## 2021-02-03 NOTE — Telephone Encounter (Signed)
See notes where GI has already inform the pt to hold her Eliquis x 2 days prior. I will forward notes to requesting office and close out from pre op call back pool.

## 2021-02-06 DIAGNOSIS — I129 Hypertensive chronic kidney disease with stage 1 through stage 4 chronic kidney disease, or unspecified chronic kidney disease: Secondary | ICD-10-CM | POA: Diagnosis not present

## 2021-02-06 DIAGNOSIS — R809 Proteinuria, unspecified: Secondary | ICD-10-CM | POA: Diagnosis not present

## 2021-02-06 DIAGNOSIS — E559 Vitamin D deficiency, unspecified: Secondary | ICD-10-CM | POA: Diagnosis not present

## 2021-02-06 DIAGNOSIS — N281 Cyst of kidney, acquired: Secondary | ICD-10-CM | POA: Diagnosis not present

## 2021-02-06 DIAGNOSIS — N182 Chronic kidney disease, stage 2 (mild): Secondary | ICD-10-CM | POA: Diagnosis not present

## 2021-02-06 DIAGNOSIS — I480 Paroxysmal atrial fibrillation: Secondary | ICD-10-CM | POA: Diagnosis not present

## 2021-02-13 ENCOUNTER — Inpatient Hospital Stay: Payer: PPO

## 2021-02-16 ENCOUNTER — Ambulatory Visit (HOSPITAL_COMMUNITY)
Admission: RE | Admit: 2021-02-16 | Discharge: 2021-02-16 | Disposition: A | Discharge status: Home / Self Care | Payer: PPO | Source: Ambulatory Visit | Attending: Nurse Practitioner | Admitting: Nurse Practitioner

## 2021-02-16 ENCOUNTER — Inpatient Hospital Stay: Payer: PPO | Attending: Oncology

## 2021-02-16 ENCOUNTER — Other Ambulatory Visit: Payer: Self-pay

## 2021-02-16 DIAGNOSIS — K7689 Other specified diseases of liver: Secondary | ICD-10-CM | POA: Diagnosis not present

## 2021-02-16 DIAGNOSIS — C49A2 Gastrointestinal stromal tumor of stomach: Secondary | ICD-10-CM | POA: Insufficient documentation

## 2021-02-16 DIAGNOSIS — M47819 Spondylosis without myelopathy or radiculopathy, site unspecified: Secondary | ICD-10-CM | POA: Diagnosis not present

## 2021-02-16 DIAGNOSIS — K3189 Other diseases of stomach and duodenum: Secondary | ICD-10-CM | POA: Diagnosis not present

## 2021-02-16 DIAGNOSIS — I708 Atherosclerosis of other arteries: Secondary | ICD-10-CM | POA: Diagnosis not present

## 2021-02-16 LAB — BASIC METABOLIC PANEL - CANCER CENTER ONLY
Anion gap: 5 (ref 5–15)
BUN: 12 mg/dL (ref 8–23)
CO2: 29 mmol/L (ref 22–32)
Calcium: 9.7 mg/dL (ref 8.9–10.3)
Chloride: 109 mmol/L (ref 98–111)
Creatinine: 1.06 mg/dL — ABNORMAL HIGH (ref 0.44–1.00)
GFR, Estimated: 56 mL/min — ABNORMAL LOW (ref >60)
Glucose, Bld: 92 mg/dL (ref 70–99)
Potassium: 4.2 mmol/L (ref 3.5–5.1)
Sodium: 143 mmol/L (ref 135–145)

## 2021-02-16 MED ORDER — IOHEXOL 300 MG/ML  SOLN
100.0000 mL | Freq: Once | INTRAMUSCULAR | Status: AC | PRN
  Administered 2021-02-16: 100 mL via INTRAVENOUS

## 2021-02-16 MED ORDER — IOHEXOL 9 MG/ML PO SOLN
1000.0000 mL | ORAL | Status: AC
  Administered 2021-02-16: 1000 mL via ORAL

## 2021-02-18 ENCOUNTER — Other Ambulatory Visit: Payer: Self-pay | Admitting: Cardiovascular Disease

## 2021-02-28 ENCOUNTER — Other Ambulatory Visit: Payer: Self-pay | Admitting: Family Medicine

## 2021-03-02 NOTE — Progress Notes (Signed)
Attempted to obtain medical history via telephone, unable to reach at this time. I left a voicemail to return pre surgical testing department's phone call.  

## 2021-03-04 ENCOUNTER — Other Ambulatory Visit (HOSPITAL_COMMUNITY)
Admission: RE | Admit: 2021-03-04 | Discharge: 2021-03-04 | Disposition: A | Discharge status: Home / Self Care | Payer: PPO | Source: Ambulatory Visit | Attending: Gastroenterology | Admitting: Gastroenterology

## 2021-03-04 DIAGNOSIS — Z20822 Contact with and (suspected) exposure to covid-19: Secondary | ICD-10-CM | POA: Diagnosis not present

## 2021-03-04 DIAGNOSIS — Z01812 Encounter for preprocedural laboratory examination: Secondary | ICD-10-CM | POA: Diagnosis not present

## 2021-03-04 LAB — SARS CORONAVIRUS 2 (TAT 6-24 HRS): SARS Coronavirus 2: NEGATIVE

## 2021-03-08 ENCOUNTER — Ambulatory Visit (HOSPITAL_COMMUNITY): Payer: PPO | Admitting: Anesthesiology

## 2021-03-08 ENCOUNTER — Ambulatory Visit (HOSPITAL_COMMUNITY)
Admission: RE | Admit: 2021-03-08 | Discharge: 2021-03-08 | Disposition: A | Discharge status: Home / Self Care | Payer: PPO | Attending: Gastroenterology | Admitting: Gastroenterology

## 2021-03-08 ENCOUNTER — Other Ambulatory Visit: Payer: Self-pay

## 2021-03-08 ENCOUNTER — Telehealth: Payer: Self-pay

## 2021-03-08 ENCOUNTER — Encounter (HOSPITAL_COMMUNITY)
Admission: RE | Disposition: A | Discharge status: Home / Self Care | Payer: Self-pay | Source: Home / Self Care | Attending: Gastroenterology

## 2021-03-08 ENCOUNTER — Encounter (HOSPITAL_COMMUNITY): Payer: Self-pay | Admitting: Gastroenterology

## 2021-03-08 DIAGNOSIS — I130 Hypertensive heart and chronic kidney disease with heart failure and stage 1 through stage 4 chronic kidney disease, or unspecified chronic kidney disease: Secondary | ICD-10-CM | POA: Diagnosis not present

## 2021-03-08 DIAGNOSIS — Z87891 Personal history of nicotine dependence: Secondary | ICD-10-CM | POA: Diagnosis not present

## 2021-03-08 DIAGNOSIS — D490 Neoplasm of unspecified behavior of digestive system: Secondary | ICD-10-CM

## 2021-03-08 DIAGNOSIS — R933 Abnormal findings on diagnostic imaging of other parts of digestive tract: Secondary | ICD-10-CM | POA: Insufficient documentation

## 2021-03-08 DIAGNOSIS — K929 Disease of digestive system, unspecified: Secondary | ICD-10-CM | POA: Insufficient documentation

## 2021-03-08 DIAGNOSIS — K3189 Other diseases of stomach and duodenum: Secondary | ICD-10-CM | POA: Diagnosis not present

## 2021-03-08 DIAGNOSIS — I5041 Acute combined systolic (congestive) and diastolic (congestive) heart failure: Secondary | ICD-10-CM | POA: Diagnosis not present

## 2021-03-08 DIAGNOSIS — I899 Noninfective disorder of lymphatic vessels and lymph nodes, unspecified: Secondary | ICD-10-CM | POA: Insufficient documentation

## 2021-03-08 DIAGNOSIS — K2950 Unspecified chronic gastritis without bleeding: Secondary | ICD-10-CM | POA: Diagnosis not present

## 2021-03-08 DIAGNOSIS — K449 Diaphragmatic hernia without obstruction or gangrene: Secondary | ICD-10-CM | POA: Insufficient documentation

## 2021-03-08 DIAGNOSIS — C49A9 Gastrointestinal stromal tumor of other sites: Secondary | ICD-10-CM | POA: Diagnosis not present

## 2021-03-08 DIAGNOSIS — K219 Gastro-esophageal reflux disease without esophagitis: Secondary | ICD-10-CM | POA: Diagnosis not present

## 2021-03-08 DIAGNOSIS — N182 Chronic kidney disease, stage 2 (mild): Secondary | ICD-10-CM | POA: Diagnosis not present

## 2021-03-08 HISTORY — PX: ESOPHAGOGASTRODUODENOSCOPY: SHX5428

## 2021-03-08 HISTORY — PX: EUS: SHX5427

## 2021-03-08 HISTORY — PX: BIOPSY: SHX5522

## 2021-03-08 HISTORY — DX: Chronic kidney disease, unspecified: N18.9

## 2021-03-08 SURGERY — EGD (ESOPHAGOGASTRODUODENOSCOPY)
Anesthesia: Monitor Anesthesia Care

## 2021-03-08 MED ORDER — LIDOCAINE 2% (20 MG/ML) 5 ML SYRINGE
INTRAMUSCULAR | Status: DC | PRN
  Administered 2021-03-08: 100 mg via INTRAVENOUS

## 2021-03-08 MED ORDER — PROPOFOL 500 MG/50ML IV EMUL
INTRAVENOUS | Status: DC | PRN
  Administered 2021-03-08: 125 ug/kg/min via INTRAVENOUS

## 2021-03-08 MED ORDER — APIXABAN 5 MG PO TABS: 5.0000 mg | ORAL_TABLET | Freq: Two times a day (BID) | ORAL | 0 refills | Status: DC

## 2021-03-08 MED ORDER — PROPOFOL 10 MG/ML IV BOLUS
INTRAVENOUS | Status: DC | PRN
  Administered 2021-03-08 (×3): 20 mg via INTRAVENOUS

## 2021-03-08 MED ORDER — LACTATED RINGERS IV SOLN: INTRAVENOUS | Status: DC

## 2021-03-08 MED ORDER — PROPOFOL 500 MG/50ML IV EMUL
INTRAVENOUS | Status: AC
  Filled 2021-03-08: qty 50

## 2021-03-08 MED ORDER — PROPOFOL 10 MG/ML IV BOLUS
INTRAVENOUS | Status: AC
  Filled 2021-03-08: qty 20

## 2021-03-08 NOTE — Anesthesia Procedure Notes (Signed)
Date/Time: 03/08/2021 9:12 AM Performed by: Sharlette Dense, CRNA Oxygen Delivery Method: Simple face mask

## 2021-03-08 NOTE — Anesthesia Postprocedure Evaluation (Signed)
Anesthesia Post Note  Patient: Claudia Cantrell  Procedure(s) Performed: UPPER ENDOSCOPIC ULTRASOUND (EUS) RADIAL (N/A ) ESOPHAGOGASTRODUODENOSCOPY (EGD) (N/A ) BIOPSY     Patient location during evaluation: PACU Anesthesia Type: MAC Level of consciousness: awake and alert Pain management: pain level controlled Vital Signs Assessment: post-procedure vital signs reviewed and stable Respiratory status: spontaneous breathing, nonlabored ventilation, respiratory function stable and patient connected to nasal cannula oxygen Cardiovascular status: stable and blood pressure returned to baseline Postop Assessment: no apparent nausea or vomiting Anesthetic complications: no   No complications documented.  Last Vitals:  Vitals:   03/08/21 1020 03/08/21 1030  BP: (!) 151/99   Pulse: 62 (!) 40  Resp: 16 16  Temp:    SpO2: 96% 96%    Last Pain:  Vitals:   03/08/21 1030  TempSrc:   PainSc: 0-No pain                 Tiajuana Amass

## 2021-03-08 NOTE — Anesthesia Preprocedure Evaluation (Addendum)
Anesthesia Evaluation  Patient identified by MRN, date of birth, ID band Patient awake    Reviewed: Allergy & Precautions, NPO status , Patient's Chart, lab work & pertinent test results, reviewed documented beta blocker date and time   History of Anesthesia Complications Negative for: history of anesthetic complications  Airway Mallampati: II  TM Distance: >3 FB Neck ROM: Full    Dental  (+) Dental Advisory Given, Partial Lower, Partial Upper   Pulmonary sleep apnea and Continuous Positive Airway Pressure Ventilation , former smoker,    Pulmonary exam normal        Cardiovascular hypertension, Pt. on medications and Pt. on home beta blockers + CAD, + Past MI and +CHF  Normal cardiovascular exam+ dysrhythmias Atrial Fibrillation    '20 TTE - EF 45 to 50%. Moderately increased LVH. Basal to mid inferior/inferolateral hypokinesis. Left atrial size was moderately dilated. Trivial pericardial effusion is present. Mild mitral valve regurgitation. Aortic valve regurgitation is mild.    Neuro/Psych negative neurological ROS  negative psych ROS   GI/Hepatic Neg liver ROS, GERD  Controlled,  Endo/Other  Hypothyroidism   Renal/GU negative Renal ROS     Musculoskeletal  (+) Arthritis ,   Abdominal   Peds  Hematology  On eliquis    Anesthesia Other Findings Covid test negative   Reproductive/Obstetrics                            Anesthesia Physical Anesthesia Plan  ASA: III  Anesthesia Plan: MAC   Post-op Pain Management:    Induction: Intravenous  PONV Risk Score and Plan: 2 and Propofol infusion and Treatment may vary due to age or medical condition  Airway Management Planned: Nasal Cannula and Natural Airway  Additional Equipment: None  Intra-op Plan:   Post-operative Plan:   Informed Consent: I have reviewed the patients History and Physical, chart, labs and discussed the  procedure including the risks, benefits and alternatives for the proposed anesthesia with the patient or authorized representative who has indicated his/her understanding and acceptance.       Plan Discussed with: CRNA and Anesthesiologist  Anesthesia Plan Comments:        Anesthesia Quick Evaluation

## 2021-03-08 NOTE — Telephone Encounter (Signed)
Recalls have been entered  The pt has been advised to get records sent to our office review

## 2021-03-08 NOTE — Discharge Instructions (Signed)
YOU HAD AN ENDOSCOPIC PROCEDURE TODAY: Refer to the procedure report and other information in the discharge instructions given to you for any specific questions about what was found during the examination. If this information does not answer your questions, please call [facility] office at [phone] to clarify.   YOU SHOULD EXPECT: Some feelings of bloating in the abdomen. Passage of more gas than usual. Walking can help get rid of the air that was put into your GI tract during the procedure and reduce the bloating. If you had a lower endoscopy (such as a colonoscopy or flexible sigmoidoscopy) you may notice spotting of blood in your stool or on the toilet paper. Some abdominal soreness may be present for a day or two, also.  DIET: Your first meal following the procedure should be a light meal and then it is ok to progress to your normal diet. A half-sandwich or bowl of soup is an example of a good first meal. Heavy or fried foods are harder to digest and may make you feel nauseous or bloated. Drink plenty of fluids but you should avoid alcoholic beverages for 24 hours. If you had a esophageal dilation, please see attached instructions for diet.    ACTIVITY: Your care partner should take you home directly after the procedure. You should plan to take it easy, moving slowly for the rest of the day. You can resume normal activity the day after the procedure however YOU SHOULD NOT DRIVE, use power tools, machinery or perform tasks that involve climbing or major physical exertion for 24 hours (because of the sedation medicines used during the test).   SYMPTOMS TO REPORT IMMEDIATELY: A gastroenterologist can be reached at any hour. Please call [phone]  for any of the following symptoms:   Following upper endoscopy (EGD, EUS, ERCP, esophageal dilation) Vomiting of blood or coffee ground material  New, significant abdominal pain  New, significant chest pain or pain under the shoulder blades  Painful or  persistently difficult swallowing  New shortness of breath  Black, tarry-looking or red, bloody stools  FOLLOW UP:  If any biopsies were taken you will be contacted by phone or by letter within the next 1-3 weeks. Call [phone]  if you have not heard about the biopsies in 3 weeks.  Please also call with any specific questions about appointments or follow up tests.  

## 2021-03-08 NOTE — Telephone Encounter (Signed)
-----   Message from Irving Copas., MD sent at 03/08/2021 10:19 AM EDT ----- Regarding: Follow up Claudia Cantrell, Please set this patient a follow up in June or July with me. 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 She and I will discuss possible colonoscopy repeat later this year for colon cancer screening at the clinic visit. Also, we need to get the previous Colonoscopy report from [facility] that she had within the last 6-8 years. Thanks. GM

## 2021-03-08 NOTE — Transfer of Care (Signed)
Immediate Anesthesia Transfer of Care Note  Patient: Claudia Cantrell  Procedure(s) Performed: UPPER ENDOSCOPIC ULTRASOUND (EUS) RADIAL (N/A ) ESOPHAGOGASTRODUODENOSCOPY (EGD) (N/A ) BIOPSY  Patient Location: Endoscopy Unit  Anesthesia Type:MAC  Level of Consciousness: drowsy  Airway & Oxygen Therapy: Patient Spontanous Breathing and Patient connected to face mask oxygen  Post-op Assessment: Report given to RN and Post -op Vital signs reviewed and stable  Post vital signs: Reviewed and stable  Last Vitals:  Vitals Value Taken Time  BP    Temp    Pulse    Resp    SpO2      Last Pain:  Vitals:   03/08/21 0812  TempSrc: Oral  PainSc: 0-No pain         Complications: No complications documented.

## 2021-03-08 NOTE — H&P (Signed)
GASTROENTEROLOGY PROCEDURE H&P NOTE   Primary Care Physician: Girtha Rm, NP-C  HPI: Claudia Cantrell is a 75 y.o. 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  Past Medical History:  Diagnosis Date  . Arthritis    rheumatoid  . CAD (coronary artery disease)    a. 1992 s/p MI and PTCA of unknown vessel;  b. 09/2010 Cath: LM nl, LAD 20p, LCX 81m, RCA 18m, RPL 20.  Marland Kitchen Cervical cancer ([place]) 1977  . Chronic kidney disease   . Family history of anesthesia complication    daughter has difficulty waking   . GERD (gastroesophageal reflux disease)    occ  . Gout 10/08/2016  . Hyperlipidemia   . Hypertensive heart disease   . Hypothyroidism   . Nonischemic cardiomyopathy ([place])    a. 07/2016 Echo: EF 40-45%, mild LVH, inferior akinesis, moderately dilated left atrium, trivial AI and MR.  Marland Kitchen PAF (paroxysmal atrial fibrillation) ([place])    a. 07/2016 Admitted w/ AF RVR-->CHA2DS2VASc = 5-->Eliquis;  b. 07/2016 successful TEE/DCCV.  Marland Kitchen Sleep apnea    a. Using CPAP.   Past Surgical History:  Procedure Laterality Date  . ABDOMINAL HYSTERECTOMY  1988  . BACK SURGERY  1994  . BIOPSY  09/02/2019   Procedure: BIOPSY;  Surgeon: Rush Landmark Telford Nab., MD;  Location: [facility];  Service: Gastroenterology;;  . BIOPSY  12/09/2019   Procedure: BIOPSY;  Surgeon: Irving Copas., MD;  Location: [facility];  Service: Gastroenterology;;  . [place]   PTCA BY DR Lamount Cohen  . CARDIOVERSION N/A 08/01/2016   Procedure: CARDIOVERSION;  Surgeon: Lelon Perla, MD;  Location: [facility] END[facility]COPY;  Service: Cardiovascular;  Laterality: N/A;  . CARDIOVERSION N/A 01/11/2020   Procedure: CARDIOVERSION;  Surgeon: Josue Hector, MD;  Location: [facility] ENDOSCOPY;  Service: Cardiovascular;  Laterality: N/A;  . CARDIOVERSION N/A 07/08/2020   Procedure: CARDIOVERSION;  Surgeon: [name], MD;  Location: [place];  Service: Cardiovascular;   Laterality: N/A;  . ENDOSCOPIC MUCOSAL RESECTION N/A 12/09/2019   Procedure: ENDOSCOPIC MUCOSAL RESECTION;  Surgeon: Rush Landmark Telford Nab., MD;  Location: [facility] ENDOSCOPY;  Service: Gastroenterology;  Laterality: N/A;  . ESOPHAGOGASTRODUODENOSCOPY (EGD) WITH PROPOFOL N/A 09/02/2019   Procedure: ESOPHAGOGASTRODUODENOSCOPY (EGD) WITH PROPOFOL;  Surgeon: Rush Landmark Telford Nab., MD;  Location: [facility] ENDOSCOPY;  Service: Gastroenterology;  Laterality: N/A;  . ESOPHAGOGASTRODUODENOSCOPY (EGD) WITH PROPOFOL N/A 12/09/2019   Procedure: ESOPHAGOGASTRODUODENOSCOPY (EGD) WITH PROPOFOL;  Surgeon: Rush Landmark Telford Nab., MD;  Location: [facility] ENDOS[facility];  Service: Gastroenterology;  Laterality: N/A;  . EUS N/A 09/02/2019   Procedure: UPPER ENDOSCOPIC ULTRASOUND (EUS) RADIAL;  Surgeon: Rush Landmark Telford Nab., MD;  Location: [facility];  Service: Gastroenterology;  Laterality: N/A;  EUS radial/linear  . EUS N/A 12/09/2019   Procedure: UPPER ENDOSCOPIC ULTRASOUND (EUS) RADIAL;  Surgeon: Irving Copas., MD;  Location: [facility];  Service: Gastroenterology;  Laterality: N/A;  . EUS  12/09/2019   Procedure: UPPER ENDOSCOPIC ULTRASOUND (EUS) LINEAR;  Surgeon: Irving Copas., MD;  Location: [facility];  Service: Gastroenterology;;  . FINE NEEDLE ASPIRATION  09/02/2019   Procedure: FINE NEEDLE ASPIRATION (FNA) LINEAR;  Surgeon: Irving Copas., MD;  Location: [facility];  Service: Gastroenterology;;  . FINE NEEDLE ASPIRATION  12/09/2019   Procedure: FINE NEEDLE ASPIRATION (FNA) LINEAR;  Surgeon: Irving Copas., MD;  Location: [facility];  Service: Gastroenterology;;  . HEMOSTASIS CLIP PLACEMENT  12/09/2019   Procedure: HEMOSTASIS CLIP PLACEMENT;  Surgeon: Rush Landmark,  Telford Nab., MD;  Location: [facility];  Service: Gastroenterology;;  . ORIF ANKLE FRACTURE Right 04/27/2015   Procedure: OPEN REDUCTION INTERNAL FIXATION (ORIF) RIGHT BIMALLEOLAR ANKLE FRACTURE WITH SYNDESMOSIS  FIXATION;  Surgeon: Leandrew Koyanagi, MD;  Location: [place];  Service: Orthopedics;  Laterality: Right;  . SUBMUCOSAL LIFTING INJECTION  12/09/2019   Procedure: SUBMUCOSAL LIFTING INJECTION;  Surgeon: Rush Landmark Telford Nab., MD;  Location: [facility];  Service: Gastroenterology;;  . TEE WITHOUT CARDIOVERSION N/A 08/01/2016   Procedure: TRANSESOPHAGEAL ECHOCARDIOGRAM (TEE);  Surgeon: Lelon Perla, MD;  Location: [facility] ENDOSCOPY;  Service: Cardiovascular;  Laterality: N/A;  . THYROIDECTOMY  11/24/2013   DR Dalbert Batman  . THYROIDECTOMY N/A 11/24/2013   Procedure: TOTAL THYROIDECTOMY;  Surgeon: Adin Hector, MD;  Location: [place];  Service: General;  Laterality: N/A;  . TRANSTHORACIC ECHOCARDIOGRAM  01/15/2013   EF 55% TO 65%. PROBABLE MILD HYPOKINESIS OF THE INFERIOR MYOCARDIUM. GRADE 1 DIASTOLIC DYSFUNCTION. TRIAL AR.LA IS MILDLY DILATED.   Current Facility-Administered Medications  Medication Dose Route Frequency Provider Last Rate Last Admin  . lactated ringers infusion   Intravenous Continuous Mansouraty, Telford Nab., MD 125 mL/hr at 03/08/21 0826 New Bag at 03/08/21 6629   Allergies  Allergen Reactions  . Labetalol     headaches  . Codeine Anxiety   Family History  Problem Relation Age of Onset  . Heart disease Mother   . Sudden death Mother   . Diabetes Father   . Stroke Father   . Bone cancer Sister   . Sudden death Brother   . Melanoma Brother   . Heart disease Brother   . Colon cancer Neg Hx   . Esophageal cancer Neg Hx   . Inflammatory bowel disease Neg Hx   . Liver disease Neg Hx   . Pancreatic cancer Neg Hx   . Rectal cancer Neg Hx   . Stomach cancer Neg Hx    Social History   Socioeconomic History  . Marital status: Widowed    Spouse name: Not on file  . Number of children: 3  . Years of education: Not on file  . Highest education level: Not on file  Occupational History  . Occupation: retired  Tobacco Use  . Smoking status: Former Smoker    Packs/day: 1.00     Years: 15.00    Pack years: 15.00    Types: Cigarettes    Quit date: 12/10/1990    Years since quitting: 30.2  . Smokeless tobacco: Never Used  Vaping Use  . Vaping Use: Never used  Substance and Sexual Activity  . Alcohol use: Yes    Comment: very rare  . Drug use: No  . Sexual activity: Not on file  Other Topics Concern  . Not on file  Social History Narrative  . Not on file   Social Determinants of Health   Financial Resource Strain: Not on file  Food Insecurity: Not on file  Transportation Needs: Not on file  Physical Activity: Not on file  Stress: Not on file  Social Connections: Not on file  Intimate Partner Violence: Not on file    Physical Exam: Vital signs in last 24 hours: Temp:  [98.1 F (36.7 C)] 98.1 F (36.7 C) (03/30 0812) Resp:  [19] 19 (03/30 0812) BP: (146)/(88) 146/88 (03/30 0812) SpO2:  [100 %] 100 % (03/30 0812) Weight:  [98.9 kg] 98.9 kg (03/30 0812)   GEN: NAD EYE: Sclerae anicteric ENT: MMM CV: Non-tachycardic GI: Soft, NT/ND NEURO:  Alert &  Oriented x 3  Lab Results: No results for input(s): WBC, HGB, HCT, PLT in the last 72 hours. BMET No results for input(s): NA, K, CL, CO2, GLUCOSE, BUN, CREATININE, CALCIUM in the last 72 hours. LFT No results for input(s): PROT, ALBUMIN, AST, ALT, ALKPHOS, BILITOT, BILIDIR, IBILI in the last 72 hours. PT/INR No results for input(s): LABPROT, INR in the last 72 hours.   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  The risks of an EUS including intestinal perforation, bleeding, infection, aspiration, and medication effects were discussed as was the possibility it may not give a definitive diagnosis if a biopsy is performed.  When a biopsy of the pancreas is done as part of the EUS, there is an additional risk of pancreatitis at the rate of about 1-2%.  It was explained that procedure related pancreatitis is typically mild, although it can be  severe and even life threatening, which is why we do not perform random pancreatic biopsies and only biopsy a lesion/area we feel is concerning enough to warrant the risk.  The risks and benefits of endoscopic evaluation were discussed with the patient; these include but are not limited to the risk of perforation, infection, bleeding, missed lesions, lack of diagnosis, severe illness requiring hospitalization, as well as anesthesia and sedation related illnesses.  The patient is agreeable to proceed.    Justice Britain, MD [facility] Advanced Endoscopy Office # [phone]

## 2021-03-08 NOTE — Op Note (Addendum)
[facility] Patient Name: Claudia Cantrell Procedure Date: 03/08/2021 MRN: [id number] Attending MD: Justice Britain , MD Date of Birth: Nov 12, 1946 CSN: [id number] Age: 75 Admit Type: Outpatient Procedure:                Upper EUS Indications:              Gastric deformity on endoscopy/Subepithelial tumor                            versus extrinsic compression, Submucosal tumor                            versus extrinsic mass found on endoscopy, Abnormal                            CT of the GI tract Providers:                Justice Britain, MD, Baird Cancer, RN, Nelia Shi, RN, Laverda Sorenson, Technician, Danley Danker, CRNA Referring MD:             Laurian Brim. Raenette Rover NP, Izola Price. Benay Spice MD, Ned Card NP Medicines:                Monitored Anesthesia Care Complications:            No immediate complications. Estimated Blood Loss:     Estimated blood loss was minimal. Procedure:                Pre-Anesthesia Assessment:                           - Prior to the procedure, a History and Physical                            was performed, and patient medications and                            allergies were reviewed. The patient's tolerance of                            previous anesthesia was also reviewed. The risks                            and benefits of the procedure and the sedation                            options and risks were discussed with the patient.                            All  questions were answered, and informed consent                            was obtained. Prior Anticoagulants: The patient has                            taken Eliquis (apixaban), last dose was 2 days                            prior to procedure. ASA Grade Assessment: III - A                            patient with severe systemic disease. After                            reviewing the  risks and benefits, the patient was                            deemed in satisfactory condition to undergo the                            procedure.                           After obtaining informed consent, the endoscope was                            passed under direct vision. Throughout the                            procedure, the patient's blood pressure, pulse, and                            oxygen saturations were monitored continuously. The                            GIF-H190 ([id number]) was introduced through the                            mouth, and advanced to the second part of duodenum.                            The Olympus Radial ([id number]) was introduced through                            the mouth, and advanced to the stomach for                            ultrasound examination. The upper EUS was                            accomplished without difficulty. The patient  tolerated the procedure. Scope In: Scope Out: Findings:      ENDOSCOPIC FINDING: :      No gross lesions were noted in the entire esophagus.      The Z-line was regular and was found 43 cm from the incisors.      A 1 cm hiatal hernia was present.      Patchy mildly erythematous mucosa without bleeding was found in the       gastric fundus and in the gastric body.      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      No other gross lesions were noted in the entire examined stomach.       Biopsies were taken with a cold forceps for histology and Helicobacter       pylori testing.      No gross lesions were noted in the duodenal bulb, in the first portion       of the duodenum and in the second portion of the duodenum.      ENDOSONOGRAPHIC FINDING: :      A round intramural (subepithelial) lesion was found in the  body of the       stomach. The lesion was hypoechoic. Sonographically, the lesion appeared       to originate from the muscularis propria (Layer 4). The lesion measured       15 mm (in maximum thickness). The lesion also measured 12 mm in       diameter. The outer endosonographic borders were smooth. 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      No malignant-appearing lymph nodes were visualized in the paracardial       region (level 16), left gastric region (level 17), gastrohepatic       ligament (level 18), celiac region (level 20) and perigastric region.      Endosonographic imaging in the visualized portion of the liver showed no       lesion.      The celiac region was visualized. Impression:               EGD Impression:                           - No gross lesions in esophagus. Z-line regular, 43                            cm from the incisors.                           - 1 cm hiatal hernia.                           - Erythematous mucosa in the gastric fundus and                            gastric body. Stomach biopsied to rule out HP.                           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                           - No gross lesions in the duodenal bulb, in the                            first portion of the duodenum and in the second                            portion of the duodenum.                           EUS Impression:                           - An intramural (subepithelial) lesion was found in                            the body of the stomach. The lesion appeared to                            originate from within the muscularis propria (Layer                            4). A tissue diagnosis was obtained prior to this                            exam in 2020. This is consistent with a stromal                             cell (smooth muscle) neoplasm. No significant                            changes in size over the last 1.5 years. CT scan                            showed this area to be approximately 13 mm in size.                            Overall, a good correlation with the CT scan.                           - No malignant-appearing lymph nodes were                            visualized in the paracardial region (level 16),  left gastric region (level 17), gastrohepatic                            ligament (level 18), celiac region (level 20) and                            perigastric region. Moderate Sedation:      Not Applicable - Patient had care per Anesthesia. Recommendation:           - The patient will be observed post-procedure,                            until all discharge criteria are met.                           - Discharge patient to home.                           - Patient has a contact number available for                            emergencies. The signs and symptoms of potential                            delayed complications were discussed with the                            patient. Return to normal activities tomorrow.                            Written discharge instructions were provided to the                            patient.                           - Resume previous diet.                           - Observe patient's clinical course.                           - May restart Eliquis on 3/31 AM to decrease risk                            of post-interventional bleeding.                           - Await path results.                           - Recommend that we transition to alternating                            CT/EUS moving forward to ensure stability for at  least the next 4-years (since this lesion is                            visible on CT scan). If no significant changes are                             noted, then may be able to extend surveillance                            thereafter (or if patient medical status changes).                            When/if GIST becomes >2 cm, then surgical                            intervention will need to be considered.                           - Next CT will be in 3/23.                           - Follow up with Oncology as already planned.                           - If evidence of gastritis present, then consider                            re-initiation of PPI therapy.                           - The findings and recommendations were discussed                            with the patient.                           - The findings and recommendations were discussed                            with the patient's family. Procedure Code(s):        --- Professional ---                           [phone], Esophagogastroduodenoscopy, flexible,                            transoral; with endoscopic ultrasound examination                            limited to the esophagus, stomach or duodenum, and                            adjacent structures  12458, Esophagogastroduodenoscopy, flexible,                            transoral; with biopsy, single or multiple Diagnosis Code(s):        --- Professional ---                           K44.9, Diaphragmatic hernia without obstruction or                            gangrene                           K31.89, Other diseases of stomach and duodenum                           D49.0, Neoplasm of unspecified behavior of                            digestive system                           I89.9, Noninfective disorder of lymphatic vessels                            and lymph nodes, unspecified                           K92.9, Disease of digestive system, unspecified                           R93.3, Abnormal findings on diagnostic imaging of                            other parts of  digestive tract CPT copyright 2019 [employer]. All rights reserved. The codes documented in this report are preliminary and upon coder review may  be revised to meet current compliance requirements. Justice Britain, MD 03/08/2021 10:06:55 AM Number of Addenda: 0

## 2021-03-09 ENCOUNTER — Encounter: Payer: Self-pay | Admitting: Gastroenterology

## 2021-03-09 LAB — SURGICAL PATHOLOGY

## 2021-03-15 ENCOUNTER — Telehealth: Payer: Self-pay | Admitting: Cardiovascular Disease

## 2021-03-15 MED ORDER — AMLODIPINE BESYLATE 5 MG PO TABS: 7.5000 mg | ORAL_TABLET | Freq: Every evening | ORAL | 0 refills | Status: DC

## 2021-03-15 MED ORDER — HYDROCHLOROTHIAZIDE 12.5 MG PO CAPS: 12.5000 mg | ORAL_CAPSULE | Freq: Every morning | ORAL | 0 refills | Status: DC

## 2021-03-15 NOTE — Telephone Encounter (Signed)
Spoke with patient of Dr. Claiborne Billings  She last saw [facility] in July 2021 She had a cardioversion but no follow up after She would like to schedule an appointment   Routed to [facility] to see if held spot on 4/15 for Dr. Claiborne Billings can be used

## 2021-03-15 NOTE — Telephone Encounter (Signed)
Patient called and stated that she really needs to see Dr. Claiborne Billings. She has only seen [facility] and did not realize he was no longer with [place]. Informed patient that Dr. Claiborne Billings did not have any availability until July. Patient would like for nurse to double check schedule because she said that's too far away. Patient also states that she needs a nurse to call pharmacy to approve her medication so she can have it. Please call back

## 2021-03-16 ENCOUNTER — Other Ambulatory Visit: Payer: Self-pay

## 2021-03-16 MED ORDER — HYDROCHLOROTHIAZIDE 12.5 MG PO CAPS: 12.5000 mg | ORAL_CAPSULE | Freq: Every morning | ORAL | 3 refills | Status: DC

## 2021-03-16 NOTE — Telephone Encounter (Signed)
Spoke with pt and scheduled f/u appointment with Dr. Claiborne Billings on 4/15 @ 9 am.

## 2021-03-24 ENCOUNTER — Ambulatory Visit: Payer: PPO | Admitting: Cardiovascular Disease

## 2021-03-24 ENCOUNTER — Encounter: Payer: Self-pay | Admitting: Cardiovascular Disease

## 2021-03-24 ENCOUNTER — Other Ambulatory Visit: Payer: Self-pay

## 2021-03-24 VITALS — BP 164/82 | HR 76 | Ht 65.0 in | Wt 219.0 lb

## 2021-03-24 DIAGNOSIS — E785 Hyperlipidemia, unspecified: Secondary | ICD-10-CM | POA: Diagnosis not present

## 2021-03-24 DIAGNOSIS — Z9989 Dependence on other enabling machines and devices: Secondary | ICD-10-CM

## 2021-03-24 DIAGNOSIS — I4819 Other persistent atrial fibrillation: Secondary | ICD-10-CM

## 2021-03-24 DIAGNOSIS — I2510 Atherosclerotic heart disease of native coronary artery without angina pectoris: Secondary | ICD-10-CM

## 2021-03-24 DIAGNOSIS — Z7901 Long term (current) use of anticoagulants: Secondary | ICD-10-CM | POA: Diagnosis not present

## 2021-03-24 DIAGNOSIS — I10 Essential (primary) hypertension: Secondary | ICD-10-CM

## 2021-03-24 DIAGNOSIS — Z9861 Coronary angioplasty status: Secondary | ICD-10-CM | POA: Diagnosis not present

## 2021-03-24 DIAGNOSIS — G4733 Obstructive sleep apnea (adult) (pediatric): Secondary | ICD-10-CM | POA: Diagnosis not present

## 2021-03-24 LAB — COMPREHENSIVE METABOLIC PANEL
ALT: 16 IU/L (ref 0–32)
AST: 22 IU/L (ref 0–40)
Albumin/Globulin Ratio: 1.4 (ref 1.2–2.2)
Albumin: 4.9 g/dL — ABNORMAL HIGH (ref 3.7–4.7)
Alkaline Phosphatase: 103 IU/L (ref 44–121)
BUN/Creatinine Ratio: 17 (ref 12–28)
BUN: 17 mg/dL (ref 8–27)
Bilirubin Total: 0.5 mg/dL (ref 0.0–1.2)
CO2: 25 mmol/L (ref 20–29)
Calcium: 9.9 mg/dL (ref 8.7–10.3)
Chloride: 102 mmol/L (ref 96–106)
Creatinine, Ser: 1.01 mg/dL — ABNORMAL HIGH (ref 0.57–1.00)
Globulin, Total: 3.4 g/dL (ref 1.5–4.5)
Glucose: 85 mg/dL (ref 65–99)
Potassium: 4.1 mmol/L (ref 3.5–5.2)
Sodium: 143 mmol/L (ref 134–144)
Total Protein: 8.3 g/dL (ref 6.0–8.5)
eGFR: 59 mL/min/{1.73_m2} — ABNORMAL LOW (ref >59)

## 2021-03-24 LAB — LIPID PANEL
Chol/HDL Ratio: 3.7 ratio (ref 0.0–4.4)
Cholesterol, Total: 150 mg/dL (ref 100–199)
HDL: 41 mg/dL (ref >39)
LDL Chol Calc (NIH): 86 mg/dL (ref 0–99)
Triglycerides: 126 mg/dL (ref 0–149)
VLDL Cholesterol Cal: 23 mg/dL (ref 5–40)

## 2021-03-24 LAB — TSH: TSH: 4.57 u[IU]/mL — ABNORMAL HIGH (ref 0.450–4.500)

## 2021-03-24 LAB — CBC
Hematocrit: 51.8 % — ABNORMAL HIGH (ref 34.0–46.6)
Hemoglobin: 16.6 g/dL — ABNORMAL HIGH (ref 11.1–15.9)
MCH: 29.3 pg (ref 26.6–33.0)
MCHC: 32 g/dL (ref 31.5–35.7)
MCV: 91 fL (ref 79–97)
Platelets: 206 10*3/uL (ref 150–450)
RBC: 5.67 x10E6/uL — ABNORMAL HIGH (ref 3.77–5.28)
RDW: 13.9 % (ref 11.7–15.4)
WBC: 3.4 10*3/uL (ref 3.4–10.8)

## 2021-03-24 LAB — MAGNESIUM: Magnesium: 2.3 mg/dL (ref 1.6–2.3)

## 2021-03-24 MED ORDER — CARVEDILOL 25 MG PO TABS: 25.0000 mg | ORAL_TABLET | Freq: Two times a day (BID) | ORAL | 3 refills | Status: DC

## 2021-03-24 NOTE — Progress Notes (Signed)
Patient ID: Claudia Cantrell, female   DOB: 03-30-1946, 74 y.o.   MRN: [id number]     HPI: Claudia Cantrell is a 75 y.o. female who presents to the office today for a 25 month follow-up evaluation.  Claudia Cantrell suffered remote myocardial infarctions in 1991 and 1992 and at that time underwent PTCA by Dr. Lamount Cohen.  She has a history of hypertension, hyperlipidemia, obstructive sleep apnea on CPAP therapy, obesity, and hypothyroidism with multinodular goiter. In October 2011 she underwent cardiac catheterization after she had experienced episodes of chest pain. Catheterization showed preserved LV contractility although the was a very small focal area of severe hypocontractility involving the mid inferior wall as well as low posterior lateral wall in this patient status post remote myocardial infarction approximately 20 years ago treated with PTCA. At the time of her catheterization she did not have significant carotid obstructive disease and only had mild 10-20% narrowing at the ostium of the LAD, 20% irregularity in the mid AV groove circumflex, and 20% mid RCA and 20% posterior lateral irregularities. Subsequently, she has done well from a cardiovascular standpoint.  She is a history of a multinodular goiter and in 2014, underwent thyroidectomy and tolerated this well from a cardiovascular standpoint.  Since I saw her, she broke her leg in 2016.  She has noticed some increasing shortness of breath particularly when she eats chocolate.  She has been followed by Dr. Jeanann Lewandowsky, for primary care.  She developed atrial fibrillation this year and was admitted August 2017 with AF with rapid ventricular response.  With a cha2ds2vasc score of 5.  She was started on eliquis..  She underwent successful TEE guided cardioversion with restoration of sinus rhythm.  She saw Claudia Cantrell on 08/08/2016 for hospital follow-up and was doing well.  An echo Doppler study in the setting of AF showed an ejection fraction  of 40-45%.  She underwent a nuclear perfusion study on August 30 117, which was low risk and suggested the possibility of a small basal to mid inferolateral defect.  There was no ischemia.  She has obstructive sleep apnea and has been on CPAP therapy since 2011.  When I saw her in October 2017, her CPAP machine was broken.  She was 100% compliant and on her prior sleep study.  She had been utilizing CPAP at 9 cm water pressure.    She received a new Medical illustrator  Sense 10 AutoSet for her CPAP unit from [employer] care as a DME company.  Two-month of compliance data were reviewed.  11/06/2016 through 12/05/2016 usage stays was 97% and usage greater than 4 hours was 97%.  She was averaging 7 hours and 9 minutes.  AHI was excellent at 0.9 at her 9 cm water pressure.  There was no leak.  She had had issues with intermittent function over new machine, which was under warranty.  This has been followed up with her DME company.   She was seen by Claudia Cantrell in September, October, and November 2018.  She has had issues with lower extremity edema and some chest congestion.  She had been using NyQuil.  Her levothyroxine dose was increased. .  She has been taking amlodipine 5 mg, Toprol-XL 50 mg for hypertension.  Levothyroxine 112 g for hypothyroidism.  She is on eliquis and denies any recent awareness of abnormal heart rhythm and is unaware of any recurrent atrial fibrillation.  She is on rosuvastatin 40 mg for hyperlipidemia.  When I saw her in  January 2019 a download was obtained from 11/16/2017 through 12/15/2017.  This showed 93% of usage stays with average use is at 7 hours and 26 minutes.  She is at 9 cm water pressure.  AHI is excellent at 0.7.  There is some occasional leaking her mask.  During that visit, her blood pressure was elevated and I suggested a slight titration of amlodipine from 5 mg up to 7.5 mg daily and that she continue her current dose of Toprol-XL 50 mg.   I last saw her in April 2019 at which time her  blood pressure was elevated and I added losartan 25 mg to take in addition to her amlodipine and Toprol for more optimal blood pressure control.  She was using CPAP with 100% compliant.  BMI was 37.94 and weight loss as well as exercise was recommended.   I saw her in July 2019 she was thankful for the time that I spent with her at her office visit discussing the importance of weight loss exercise improve diet.  Over the past several months she  lost 12 pounds.  She felt better and had more energy.  She denied chest pain or shortness of breath.  She had changed her diet.    I last saw her in March 2020 at which time she continued to feel well.  She continued sto use CPAP with 100% compliance.  She was not aware, however, that she could take her device on an airplane.  Her most recent download from January 11, 2019 through February 09, 2019 for this reason showed 87% of usage days due to the fact that she had flown up to [place] to spend 4 days with her daughter.  At a set pressure of 9 cm, AHI was 3.9.  There was no leak.  She has continued to have an improved diet.  She was on amlodipine 7.5 mg, Toprol-XL 50 mg and olmesartan 20 mg for hypertension and rosuvastatin 40 mg for hyperlipidemia.  She continuesdto take levothyroxine 112 mcg for hypothyroidism.    Since I last saw her, she has developed episodes of recurrent atrial fibrillation and has undergone 2 subsequent cardioversions initially in February 2021 by Dr. Nolon Lennert where she was returned to sinus rhythm and most recently by Dr. Gilman Schmidt in July 2021.  She is also followed by GI and has a malignant stromal tumor of the stomach.  Over the past month, she has noticed that she has become short of breath particular with walking steps and has noticed some more fatigue.  She denies any chest pain.  Her blood pressure at home has been running in the 140/90 range.  She continues to be on amlodipine 7.5 mg, carvedilol 25 mg in the morning and 12.5 mg at night,  hydralazine 10 mg twice a day, HCTZ 12.5 mg, isosorbide dinitrate 5 mg twice a day, and olmesartan 40 mg daily.  She is on Eliquis for anticoagulation 5 mg twice a day.  She is on levothyroxine 88 mcg daily for hypothyroidism and rosuvastatin 40 mg for hyperlipidemia.  She presents for evaluation  Past Medical History:  Diagnosis Date  . Arthritis    rheumatoid  . CAD (coronary artery disease)    a. 1992 s/p MI and PTCA of unknown vessel;  b. 09/2010 Cath: LM nl, LAD 20p, LCX 71m RCA 154mRPL 20.  . [place]  . Chronic kidney disease   . Family history of anesthesia complication    daughter has difficulty waking   .  GERD (gastroesophageal reflux disease)    occ  . Gout 10/08/2016  . Hyperlipidemia   . Hypertensive heart disease   . Hypothyroidism   . Nonischemic cardiomyopathy ([place])    a. 07/2016 Echo: EF 40-45%, mild LVH, inferior akinesis, moderately dilated left atrium, trivial AI and MR.  Marland Kitchen PAF (paroxysmal atrial fibrillation) ([place])    a. 07/2016 Admitted w/ AF RVR-->CHA2DS2VASc = 5-->Eliquis;  b. 07/2016 successful TEE/DCCV.  Marland Kitchen Sleep apnea    a. Using CPAP.    Past Surgical History:  Procedure Laterality Date  . ABDOMINAL HYSTERECTOMY  1988  . BACK SURGERY  1994  . BIOPSY  09/02/2019   Procedure: BIOPSY;  Surgeon: Rush Landmark Telford Nab., MD;  Location: [facility];  Service: Gastroenterology;;  . BIOPSY  12/09/2019   Procedure: BIOPSY;  Surgeon: Irving Copas., MD;  Location: [facility];  Service: Gastroenterology;;  . BIOPSY  03/08/2021   Procedure: BIOPSY;  Surgeon: Irving Copas., MD;  Location: [facility];  Service: Gastroenterology;;  . [place]   PTCA BY DR Lamount Cohen  . CARDIOVERSION N/A 08/01/2016   Procedure: CARDIOVERSION;  Surgeon: Lelon Perla, MD;  Location[facility];  Service: Cardiovascular;  Laterality: N/A;  . CARDIOVERSION N/A 01/11/2020   Procedure: CARDIOVERSION;  Surgeon:  Josue Hector, MD;  Location: [facility] ENDOSCOPY;  Service: Cardiovascular;  Laterality: N/A;  . CARDIOVERSION N/A 07/08/2020   Procedure: CARDIOVERSION;  Surgeon: [name], MD;  Location: [facility];  Service: Cardiovascular;  Laterality: N/A;  . ENDOSCOPIC MUCOSAL RESECTION N/A 12/09/2019   Procedure: ENDOSCOPIC MUCOSAL RESECTION;  Surgeon: Rush Landmark Telford Nab., MD;  Location: [facility] ENDOSCOPY;  Service: Gastroenterology;  Laterality: N/A;  . ESOPHAGOGASTRODUODENOSCOPY N/A 03/08/2021   Procedure: ESOPHAGOGASTRODUODENOSCOPY (EGD);  Surgeon: Irving Copas., MD;  Location: [facility];  Service: Gastroenterology;  Laterality: N/A;  . ESOPHAGOGASTRODUODENOSCOPY (EGD) WITH PROPOFOL N/A 09/02/2019   Procedure: ESOPHAGOGASTRODUODENOSCOPY (EGD) WITH PROPOFOL;  Surgeon: Rush Landmark Telford Nab., MD;  Location: [facility];  Service: Gastroenterology;  Laterality: N/A;  . ESOPHAGOGASTRODUODENOSCOPY (EGD) WITH PROPOFOL N/A 12/09/2019   Procedure: ESOPHAGOGASTRODUODENOSCOPY (EGD) WITH PROPOFOL;  Surgeon: Rush Landmark Telford Nab., MD;  Location: [facility];  Service: Gastroenterology;  Laterality: N/A;  . EUS N/A 09/02/2019   Procedure: UPPER ENDOSCOPIC ULTRASOUND (EUS) RADIAL;  Surgeon: Rush Landmark Telford Nab., MD;  Location: [facility];  Service: Gastroenterology;  Laterality: N/A;  EUS radial/linear  . EUS N/A 12/09/2019   Procedure: UPPER ENDOSCOPIC ULTRASOUND (EUS) RADIAL;  Surgeon: Irving Copas., MD;  Location: [facility];  Service: Gastroenterology;  Laterality: N/A;  . EUS  12/09/2019   Procedure: UPPER ENDOSCOPIC ULTRASOUND (EUS) LINEAR;  Surgeon: Irving Copas., MD;  Location: [facility];  Service: Gastroenterology;;  . EUS N/A 03/08/2021   Procedure: UPPER ENDOSCOPIC ULTRASOUND (EUS) RADIAL;  Surgeon: Irving Copas., MD;  Location: [facility];  Service: Gastroenterology;  Laterality: N/A;  . FINE NEEDLE ASPIRATION  09/02/2019   Procedure: FINE  NEEDLE ASPIRATION (FNA) LINEAR;  Surgeon: Irving Copas., MD;  Location: [facility];  Service: Gastroenterology;;  . FINE NEEDLE ASPIRATION  12/09/2019   Procedure: FINE NEEDLE ASPIRATION (FNA) LINEAR;  Surgeon: Irving Copas., MD;  Location: [facility];  Service: Gastroenterology;;  . HEMOSTASIS CLIP PLACEMENT  12/09/2019   Procedure: HEMOSTASIS CLIP PLACEMENT;  Surgeon: Irving Copas., MD;  Location: [facility];  Service: Gastroenterology;;  . ORIF ANKLE FRACTURE Right 04/27/2015   Procedure: OPEN REDUCTION INTERNAL FIXATION (ORIF) RIGHT BIMALLEOLAR ANKLE FRACTURE WITH SYNDESMOSIS  FIXATION;  Surgeon: Leandrew Koyanagi, MD;  Location: [facility];  Service: Orthopedics;  Laterality: Right;  . SUBMUCOSAL LIFTING INJECTION  12/09/2019   Procedure: SUBMUCOSAL LIFTING INJECTION;  Surgeon: Rush Landmark Telford Nab., MD;  Location: [facility];  Service: Gastroenterology;;  . TEE WITHOUT CARDIOVERSION N/A 08/01/2016   Procedure: TRANSESOPHAGEAL ECHOCARDIOGRAM (TEE);  Surgeon: Lelon Perla, MD;  Location: [facility]COPY;  Service: Cardiovascular;  Laterality: N/A;  . THYROIDECTOMY  11/24/2013   DR Dalbert Batman  . THYROIDECTOMY N/A 11/24/2013   Procedure: TOTAL THYROIDECTOMY;  Surgeon: Adin Hector, MD;  Location: [facility];  Service: General;  Laterality: N/A;  . TRANSTHORACIC ECHOCARDIOGRAM  01/15/2013   EF 55% TO 65%. PROBABLE MILD HYPOKINESIS OF THE INFERIOR MYOCARDIUM. GRADE 1 DIASTOLIC DYSFUNCTION. TRIAL AR.LA IS MILDLY DILATED.    Allergies  Allergen Reactions  . Labetalol     headaches  . Codeine Anxiety    Current Outpatient Medications  Medication Sig Dispense Refill  . acetaminophen (TYLENOL) 650 MG CR tablet Take 1,300 mg by mouth every 8 (eight) hours as needed for pain.     Marland Kitchen allopurinol (ZYLOPRIM) 100 MG tablet Take 100 mg by mouth in the morning.    Marland Kitchen amLODipine (NORVASC) 5 MG tablet Take 1.5 tablets (7.5 mg total) by mouth every evening. 90 tablet 0  . apixaban  (ELIQUIS) 5 MG TABS tablet Take 1 tablet (5 mg total) by mouth 2 (two) times daily. 180 tablet 0  . carvedilol (COREG) 25 MG tablet Take 1 tablet (25 mg) by mouth in the morning and 1/2 tablet (12.5 mg) in the evening. (Patient taking differently: Take 12.5-25 mg by mouth See admin instructions. Take 1 tablet (25 mg) by mouth in the morning and 1/2 tablet (12.5 mg) in the evening.) 45 tablet 3  . Cod Liver Oil (COD LIVER PO) Take 1 tablet by mouth 2 (two) times a week.    Marland Kitchen FARXIGA 10 MG TABS tablet Take 10 mg by mouth in the morning.    . hydrALAZINE (APRESOLINE) 10 MG tablet Take 1 tablet (10 mg total) by mouth 2 (two) times daily. 180 tablet 3  . hydrochlorothiazide (MICROZIDE) 12.5 MG capsule Take 1 capsule (12.5 mg total) by mouth in the morning. 90 capsule 3  . isosorbide dinitrate (ISORDIL) 5 MG tablet Take 1 tablet (5 mg total) by mouth 2 (two) times daily. 180 tablet 3  . Magnesium 400 MG TABS Take 400 mg by mouth every evening.    . Multiple Vitamins-Minerals (MULTIVITAMIN WITH MINERALS) tablet Take 1 tablet by mouth daily in the afternoon.    Marland Kitchen olmesartan (BENICAR) 40 MG tablet Take 40 mg by mouth in the morning.    . Probiotic Product (PROBIOTIC PO) Take 1 capsule by mouth at bedtime.    . rosuvastatin (CRESTOR) 40 MG tablet Take 1 tablet (40 mg total) by mouth daily. (Patient taking differently: Take 40 mg by mouth in the morning.) 90 tablet 1  . SYNTHROID 88 MCG tablet Take 1 tablet by mouth once daily 90 tablet 0  . Vitamin D3 (VITAMIN D) 25 MCG tablet Take 1,000 Units by mouth daily in the afternoon.    . zinc gluconate 50 MG tablet Take 50 mg by mouth daily in the afternoon.     No current facility-administered medications for this visit.    Social History   Socioeconomic History  . Marital status: Widowed    Spouse name: Not on file  . Number of children: 3  . Years  of education: Not on file  . Highest education level: Not on file  Occupational History  . Occupation:  retired  Tobacco Use  . Smoking status: Former Smoker    Packs/day: 1.00    Years: 15.00    Pack years: 15.00    Types: Cigarettes    Quit date: 12/10/1990    Years since quitting: 30.3  . Smokeless tobacco: Never Used  Vaping Use  . Vaping Use: Never used  Substance and Sexual Activity  . Alcohol use: Yes    Comment: very rare  . Drug use: No  . Sexual activity: Not on file  Other Topics Concern  . Not on file  Social History Narrative  . Not on file   Social Determinants of Health   Financial Resource Strain: Not on file  Food Insecurity: Not on file  Transportation Needs: Not on file  Physical Activity: Not on file  Stress: Not on file  Social Connections: Not on file  Intimate Partner Violence: Not on file    Family History  Problem Relation Age of Onset  . Heart disease Mother   . Sudden death Mother   . Diabetes Father   . Stroke Father   . Bone cancer Sister   . Sudden death Brother   . Melanoma Brother   . Heart disease Brother   . Colon cancer Neg Hx   . Esophageal cancer Neg Hx   . Inflammatory bowel disease Neg Hx   . Liver disease Neg Hx   . Pancreatic cancer Neg Hx   . Rectal cancer Neg Hx   . Stomach cancer Neg Hx    Social history is notable that she is widowed and has 2 children plus one adopted child as well as 2 grandchildren. She does not routinely exercise. There is no tobacco or alcohol use.   ROS General: Negative; No fevers, chills, or night sweats;  HEENT: Negative; No changes in vision or hearing, sinus congestion, difficulty swallowing Pulmonary: Negative; No cough, wheezing, shortness of breath, hemoptysis Cardiovascular: See HPI GI: Negative; No nausea, vomiting, diarrhea, or abdominal pain GU: Negative; No dysuria, hematuria, or difficulty voiding Musculoskeletal: Negative; no myalgias, joint pain, or weakness Hematologic/Oncology: Negative; no easy bruising, bleeding Endocrine: History of multinodular goiter status post  thyroidectomy Neuro: Negative; no changes in balance, headaches Skin: Negative; No rashes or skin lesions Psychiatric: Negative; No behavioral problems, depression Sleep: Positive obstructive sleep apnea.  She admits to 100% compliance.  PE BP (!) 164/82   Pulse 76   Ht '5\' 5"'  (1.651 m)   Wt 219 lb (99.3 kg)   SpO2 98%   BMI 36.44 kg/m    Repeat blood pressure by me was improved at 134/80.  Wt Readings from Last 3 Encounters:  03/24/21 219 lb (99.3 kg)  03/08/21 218 lb (98.9 kg)  01/30/21 223 lb 14.4 oz (101.6 kg)   General: Alert, oriented, no distress.  Skin: normal turgor, no rashes, warm and dry HEENT: Normocephalic, atraumatic. Pupils equal round and reactive to light; sclera anicteric; extraocular muscles intact;  Nose without nasal septal hypertrophy Mouth/Parynx benign; Mallinpatti scale 3 Neck: No JVD, no carotid bruits; normal carotid upstroke Lungs: clear to ausculatation and percussion; no wheezing or rales Chest wall: without tenderness to palpitation Heart: PMI not displaced, irregularly irregular at approximately 80 bpm, s1 s2 normal, 1/6 systolic murmur, no diastolic murmur, no rubs, gallops, thrills, or heaves Abdomen: soft, nontender; no hepatosplenomehaly, BS+; abdominal aorta nontender and not dilated by palpation.  Back: no CVA tenderness Pulses 2+ Musculoskeletal: full range of motion, normal strength, no joint deformities Extremities: no clubbing cyanosis or edema, Homan's sign negative  Neurologic: grossly nonfocal; Cranial nerves grossly wnl Psychologic: Normal mood and affect  ECG (independently read by me): Atrial fibrillation at 75; NSST changes  March 2020 ECG (independently read by me): Atrial fibrillation at 101 bpm.  Specific T changes.  Q wave in lead III.  July 2019 ECG (independently read by me): Sinus bradycardia 57 bpm.  Low voltage.  Poor R wave progression.  April 2019 ECG (independently read by me): Sinus bradycardia 53 bpm.   Nonspecific T changes.  Normal intervals.  December 16, 2017 ECG (independently read by me): Sinus bradycardia 57 bpm.  Q wave in lead 3.  Poor R wave progression V1 through V3.  October  2017 ECG (independently read by me): Sinus bradycardia 52 bpm.  Poor anterior R-wave progression.  Normal intervals.  October 2014 ECG: Sinus rhythm at 69 beats per minute. Normal intervals. Nonspecific T changes.  LABS:  BMP Latest Ref Rng & Units 02/16/2021 09/16/2020 06/30/2020  Glucose 70 - 99 mg/dL 92 87 82  BUN 8 - 23 mg/dL '12 16 22  ' Creatinine 0.44 - 1.00 mg/dL 1.06(H) 0.90 1.24(H)  BUN/Creat Ratio 12 - 28 - 18 18  Sodium 135 - 145 mmol/L 143 141 143  Potassium 3.5 - 5.1 mmol/L 4.2 4.3 4.4  Chloride 98 - 111 mmol/L 109 100 103  CO2 22 - 32 mmol/L '29 27 25  ' Calcium 8.9 - 10.3 mg/dL 9.7 10.0 9.8   Hepatic Function Latest Ref Rng & Units 09/16/2020 04/21/2020 02/11/2019  Total Protein 6.0 - 8.5 g/dL 7.6 7.6 7.1  Albumin 3.7 - 4.7 g/dL 4.6 4.5 4.4  AST 0 - 40 IU/L '17 16 28  ' ALT 0 - 32 IU/L 11 23 35(H)  Alk Phosphatase 44 - 121 IU/L 106 95 105  Total Bilirubin 0.0 - 1.2 mg/dL 0.4 0.4 0.7  Bilirubin, Direct 0.00 - 0.40 mg/dL - - -   CBC Latest Ref Rng & Units 09/16/2020 06/30/2020 04/21/2020  WBC 3.4 - 10.8 x10E3/uL 4.0 4.2 6.9  Hemoglobin 11.1 - 15.9 g/dL 14.7 15.3 13.7  Hematocrit 34.0 - 46.6 % 44.5 47.0(H) 42.5  Platelets 150 - 450 x10E3/uL 235 254 260   Lab Results  Component Value Date   MCV 91 09/16/2020   MCV 92 06/30/2020   MCV 92 04/21/2020   Lab Results  Component Value Date   TSH 2.230 09/16/2020    Lipid Panel     Component Value Date/Time   CHOL 166 09/16/2020 1205   TRIG 177 (H) 09/16/2020 1205   HDL 41 09/16/2020 1205   CHOLHDL 4.0 09/16/2020 1205   CHOLHDL 3.4 09/22/2019 0226   VLDL 19 09/22/2019 0226   LDLCALC 94 09/16/2020 1205   IMPRESSION:  No diagnosis found.   ASSESSMENT AND PLAN: Claudia Cantrell is 75 year-old [place] American female who suffered a remote  myocardial infarction  and underwent PTCA by Dr. Sherald Barge approximately 30 years ago.  Cardiac catheterization in 2011 demonstrated a small focal wall motion abnormality in the inferior and posterior lateral wall concordant with his myocardial infarction. She has mild nonobstructive CAD. On her last  nuclear perfusion study  ejection fraction was 55% and she had findings consistent with a small area of scar in the basal and mid inferolateral wall concordant with her left circumflex infarct.  She developed atrial fibrillation in August 2017 and  underwent successful TEE cardioversion.  When I last saw her in March 2020 her ECG showed that she was back in atrial fibrillation and I further titrated her beta-blocker to Toprol-XL 75 mg daily.  Since that evaluation, she has undergone 2 cardioversions initially in February 2021 and most recently on July 08, 2020 with restoration of sinus rhythm.  I have not seen her in over 2 years.  She is now on a regimen of amlodipine 7.5 mg daily, carvedilol 25 mg in the morning and 12.5 mg at night, olmesartan 40 mg daily in addition to add BiDil low-dose treatment with hydralazine 10 mg twice daily and isosorbide dinitrate 5 mg twice a day.  Her blood pressure today was elevated initially but on repeat by me 134/80.  She has noticed some more shortness of breath particularly with walking up steps.  Her EKG today shows that she is in atrial fibrillation with a ventricular rate at 76 bpm.  Presently I recommend she further titrate carvedilol to 25 mg twice a day.  I will am scheduling her for follow-up echo Doppler study for reassessment of LV systolic and diastolic function.  I am recommending recheck of laboratory with a comprehensive metabolic panel, magnesium, TSH, CBC and lipids.  Depending upon her response, we may need to change amlodipine to Cardizem or potentially consider initiation of antiarrhythmic therapy.  I do not know the duration of her at recurrent atrial fibrillation  but by clinical history it suggest that its at least over a months duration.    She continues to be on levothyroxine 88 mcg for hypothyroidism.  She is on rosuvastatin 40 mg for hyperlipidemia.  She continues to use CPAP therapy with 100% compliance.  A download was obtained from February 23, 2019 through through March 23, 2021.  AHI is excellent at 1.4 and her auto range is from 9 to 14 cm with a 95th percentile pressure 12.2 and maximum average pressure of 13.1.   I will see her in 4 weeks for reevaluation.   Troy Sine, MD, [facility]  03/24/2021 10:34 AM

## 2021-03-24 NOTE — Patient Instructions (Signed)
Medication Instructions:  INCREASE carvedilol (Coreg) to 25 mg two times daily  *If you need a refill on your cardiac medications before your next appointment, please call your pharmacy*   Lab Work: CMET, Mag, TSH, CBC, Lipid today  If you have labs (blood work) drawn today and your tests are completely normal, you will receive your results only by: Marland Kitchen MyChart Message (if you have MyChart) OR . A paper copy in the mail If you have any lab test that is abnormal or we need to change your treatment, we will call you to review the results.   Testing/Procedures: Your physician has requested that you have an echocardiogram. Echocardiography is a painless test that uses sound waves to create images of your heart. It provides your doctor with information about the size and shape of your heart and how well your heart's chambers and valves are working. This procedure takes approximately one hour. There are no restrictions for this procedure.  This will be done at our [facility] location:  [place]: At [employer], you and your health needs are our priority.  As part of our continuing mission to provide you with exceptional heart care, we have created designated Provider Care Teams.  These Care Teams include your primary Cardiologist (physician) and Advanced Practice Providers (APPs -  Physician Assistants and Nurse Practitioners) who all work together to provide you with the care you need, when you need it.  We recommend signing up for the patient portal called "MyChart".  Sign up information is provided on this After Visit Summary.  MyChart is used to connect with patients for Virtual Visits (Telemedicine).  Patients are able to view lab/test results, encounter notes, upcoming appointments, etc.  Non-urgent messages can be sent to your provider as well.   To learn more about what you can do with MyChart, go to [email or web address].    Your next appointment:    Thursday, May 26th at 12 pm with Dr. Claiborne Billings

## 2021-03-25 ENCOUNTER — Encounter: Payer: Self-pay | Admitting: Cardiovascular Disease

## 2021-03-27 ENCOUNTER — Telehealth: Payer: Self-pay

## 2021-03-27 NOTE — Telephone Encounter (Signed)
Pt will be contacted in 1 year for CT scan and follow up

## 2021-03-27 NOTE — Telephone Encounter (Signed)
-----   Message from Irving Copas., MD sent at 03/23/2021  5:18 PM EDT ----- LKT, Thanks for the update.  Sharion Grieves, Please set up a CT abdomen/pelvis with IV and oral contrast for 1 year from now with clinic visit a few weeks after.  She will be seen by Dr. Burr Medico in follow-up.  Thereafter.  She already has a 2-year EUS follow-up set up for recall.  Thanks. GM ----- Message ----- From: Owens Shark, NP Sent: 03/22/2021   2:11 PM EDT To: Irving Copas., MD  Dr Rush Landmark,  Ms. Dorion is not going to f/u with [place] at [facility]. We are going to set her up with Dr Burr Medico at [facility] for next appt. If you could please set up the 1 year CT scan we would appreciate it.  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 I think we can go to alternating EUS/CT for at least the next few years until we show continued stability and then go further out. If you all are OK with that, let me know if you want me to set up the CT scan in 1-year or if you all would? Thanks. GM

## 2021-03-30 ENCOUNTER — Telehealth: Payer: Self-pay | Admitting: Hematology

## 2021-03-30 NOTE — Telephone Encounter (Signed)
Left message with follow-up appointment per 4/18 staff message. Gave option to call back to reschedule if needed.

## 2021-03-31 ENCOUNTER — Telehealth: Payer: Self-pay | Admitting: Family Medicine

## 2021-03-31 NOTE — Telephone Encounter (Signed)
yours

## 2021-03-31 NOTE — Telephone Encounter (Signed)
Pt has upcoming appointment end of may and we will recheck then

## 2021-03-31 NOTE — Telephone Encounter (Signed)
Let her know that I reviewed her labs and her TSH which is her thyroid-stimulating hormone, was slightly elevated.  Please have her make sure she is taking her thyroid medication on an empty stomach and only with water.  Nothing to eat or drink for at least 30 to 45 minutes after taking it.  Follow-up with me in 4 weeks and we will recheck her thyroid function.

## 2021-03-31 NOTE — Telephone Encounter (Signed)
Pt called and states that she received a call from Dr. Claiborne Billings concerning her labs recently drawn at that office. Pt states that Dr. Claiborne Billings stated info was forwarded to [facility] and pt should call to. She states that her medication might need to be changed. Please call pt at [phone]

## 2021-04-04 NOTE — Progress Notes (Signed)
Office Visit Note  Patient: Claudia Cantrell             Date of Birth: [birth year]           MRN: [id number]             PCP: Girtha Rm, NP-C Referring: Girtha Rm, NP-C Visit Date: 04/18/2021 Occupation: @GUAROCC @  Subjective:  Arthritis (Not doing good, left 4th digit locking, tender, swollen, pain)   History of Present Illness: Claudia Cantrell is a 75 y.o. female history of gout and osteoarthritis.  She states she has been taking allopurinol 100 mg on a daily basis and she has not had any gout flare.  She has developed left ring trigger finger which has been bothersome for her.  She states she has stopped all her life and does gardening now.  She is also concerned about the tremors in her head and wants to be evaluated by neurologist.  Activities of Daily Living:  Patient reports morning stiffness for 24 hours.   Patient Denies nocturnal pain.  Difficulty dressing/grooming: Denies Difficulty climbing stairs: Reports Difficulty getting out of chair: Denies Difficulty using hands for taps, buttons, cutlery, and/or writing: Reports  Review of Systems  Constitutional: Positive for fatigue.  HENT: Negative for mouth dryness.   Eyes: Positive for redness.  Respiratory: Positive for shortness of breath.   Cardiovascular: Positive for irregular heartbeat and swelling in legs/feet.  Gastrointestinal: Negative for constipation.  Endocrine: Positive for increased urination.  Genitourinary: Negative for difficulty urinating.  Musculoskeletal: Positive for arthralgias, joint pain, joint swelling, muscle weakness and morning stiffness.  Skin: Negative for rash.  Allergic/Immunologic: Negative for susceptible to infections.  Neurological: Positive for numbness and weakness.  Hematological: Negative for bruising/bleeding tendency.  Psychiatric/Behavioral: Negative for sleep disturbance.    PMFS History:  Patient Active Problem List   Diagnosis Date Noted  . 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 Iatrogenic hyperthyroidism 05/02/2020  . Dyslipidemia, goal LDL below 70 05/02/2020  . Acute combined systolic and diastolic CHF, NYHA class 3 ([place]) 09/23/2019  . Persistent atrial fibrillation ([place]) 09/21/2019  . Submucosal lesion of stomach 07/19/2019  . Abnormal CT of the abdomen 07/19/2019  . Abnormal CT scan, stomach 06/17/2019  . Advance directive declined by patient 01/14/2019  . Chronic anticoagulation 01/14/2019  . CRI (chronic renal insufficiency), stage 2 (mild) 01/14/2019  . Prediabetes 01/14/2019  . Persistent proteinuria 02/17/2018  . 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 Vitamin D deficiency 03/26/2017  . Medication monitoring encounter 03/26/2017  . Gout 10/08/2016  . Essential hypertension   . CAD S/P percutaneous coronary angioplasty   . Nonischemic cardiomyopathy ([place])   . Chest pain with moderate risk for cardiac etiology 08/02/2016  . Closed right ankle fracture 04/24/2015  . Ankle fracture, bimalleolar, closed 04/24/2015  . 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 OSA on CPAP 10/09/2013    Past Medical History:  Diagnosis Date  . Arthritis    rheumatoid  . CAD (coronary artery disease)    a. 1992 s/p MI and PTCA of unknown vessel;  b. 09/2010 Cath: LM nl, LAD 20p, LCX 74m, RCA 52m, RPL 20.  Marland Kitchen Cervical cancer ([place]) 1977  . Chronic kidney disease   . Family history of anesthesia complication    daughter has difficulty waking   . GERD (gastroesophageal reflux disease)    occ  . Gout 10/08/2016  . Hyperlipidemia   . Hypertensive heart  disease   . Hypothyroidism   . Nonischemic cardiomyopathy ([place])    a. 07/2016 Echo: EF 40-45%, mild LVH, inferior akinesis, moderately dilated left atrium, trivial AI and MR.  Marland Kitchen PAF (paroxysmal atrial fibrillation) ([place])    a. 07/2016 Admitted w/ AF RVR-->CHA2DS2VASc = 5-->Eliquis;  b. 07/2016 successful TEE/DCCV.  Marland Kitchen Sleep apnea    a.  Using CPAP.    Family History  Problem Relation Age of Onset  . Heart disease Mother   . Sudden death Mother   . Diabetes Father   . Stroke Father   . Bone cancer Sister   . Sudden death Brother   . Melanoma Brother   . Heart disease Brother   . Colon cancer Neg Hx   . Esophageal cancer Neg Hx   . Inflammatory bowel disease Neg Hx   . Liver disease Neg Hx   . Pancreatic cancer Neg Hx   . Rectal cancer Neg Hx   . Stomach cancer Neg Hx    Past Surgical History:  Procedure Laterality Date  . ABDOMINAL HYSTERECTOMY  1988  . BACK SURGERY  1994  . BIOPSY  09/02/2019   Procedure: BIOPSY;  Surgeon: Rush Landmark Telford Nab., MD;  Location: [facility];  Service: Gastroenterology;;  . BIOPSY  12/09/2019   Procedure: BIOPSY;  Surgeon: Irving Copas., MD;  Location: [facility];  Service: Gastroenterology;;  . BIOPSY  03/08/2021   Procedure: BIOPSY;  Surgeon: Irving Copas., MD;  Location: [facility];  Service: Gastroenterology;;  . [place]   PTCA BY DR Lamount Cohen  . CARDIOVERSION N/A 08/01/2016   Procedure: CARDIOVERSION;  Surgeon: Lelon Perla, MD;  Location: [facility] ENDOSCOPY;  Service: Cardiovascular;  Laterality: N/A;  . CARDIOVERSION N/A 01/11/2020   Procedure: CARDIOVERSION;  Surgeon: Josue Hector, MD;  Location: [facility] ENDOSCOPY;  Service: Cardiovascular;  Laterality: N/A;  . CARDIOVERSION N/A 07/08/2020   Procedure: CARDIOVERSION;  Surgeon: [name], MD;  Location: [place];  Service: Cardiovascular;  Laterality: N/A;  . ENDOSCOPIC MUCOSAL RESECTION N/A 12/09/2019   Procedure: ENDOSCOPIC MUCOSAL RESECTION;  Surgeon: Rush Landmark Telford Nab., MD;  Location: [facility] ENDOSCOPY;  Service: Gastroenterology;  Laterality: N/A;  . ESOPHAGOGASTRODUODENOSCOPY N/A 03/08/2021   Procedure: ESOPHAGOGASTRODUODENOSCOPY (EGD);  Surgeon: Irving Copas., MD;  Location: [facility];  Service: Gastroenterology;  Laterality: N/A;   . ESOPHAGOGASTRODUODENOSCOPY (EGD) WITH PROPOFOL N/A 09/02/2019   Procedure: ESOPHAGOGASTRODUODENOSCOPY (EGD) WITH PROPOFOL;  Surgeon: Rush Landmark Telford Nab., MD;  Location: [facility] ENDOSCOPY;  Service: Gastroenterology;  Laterality: N/A;  . ESOPHAGOGASTRODUODENOSCOPY (EGD) WITH PROPOFOL N/A 12/09/2019   Procedure: ESOPHAGOGASTRODUODENOSCOPY (EGD) WITH PROPOFOL;  Surgeon: Rush Landmark Telford Nab., MD;  Location: [facility] ENDOS[facility];  Service: Gastroenterology;  Laterality: N/A;  . EUS N/A 09/02/2019   Procedure: UPPER ENDOSCOPIC ULTRASOUND (EUS) RADIAL;  Surgeon: Rush Landmark Telford Nab., MD;  Location: [facility] ENDOS[facility];  Service: Gastroenterology;  Laterality: N/A;  EUS radial/linear  . EUS N/A 12/09/2019   Procedure: UPPER ENDOSCOPIC ULTRASOUND (EUS) RADIAL;  Surgeon: Irving Copas., MD;  Location: [facility];  Service: Gastroenterology;  Laterality: N/A;  . EUS  12/09/2019   Procedure: UPPER ENDOSCOPIC ULTRASOUND (EUS) LINEAR;  Surgeon: Irving Copas., MD;  Location: [facility];  Service: Gastroenterology;;  . EUS N/A 03/08/2021   Procedure: UPPER ENDOSCOPIC ULTRASOUND (EUS) RADIAL;  Surgeon: Irving Copas., MD;  Location: [facility];  Service: Gastroenterology;  Laterality: N/A;  . FINE NEEDLE ASPIRATION  09/02/2019   Procedure: FINE NEEDLE ASPIRATION (FNA) LINEAR;  Surgeon: Rush Landmark,  Telford Nab., MD;  Location: [facility];  Service: Gastroenterology;;  . FINE NEEDLE ASPIRATION  12/09/2019   Procedure: FINE NEEDLE ASPIRATION (FNA) LINEAR;  Surgeon: Irving Copas., MD;  Location: [facility];  Service: Gastroenterology;;  . HEMOSTASIS CLIP PLACEMENT  12/09/2019   Procedure: HEMOSTASIS CLIP PLACEMENT;  Surgeon: Irving Copas., MD;  Location: [facility];  Service: Gastroenterology;;  . ORIF ANKLE FRACTURE Right 04/27/2015   Procedure: OPEN REDUCTION INTERNAL FIXATION (ORIF) RIGHT BIMALLEOLAR ANKLE FRACTURE WITH SYNDESMOSIS FIXATION;  Surgeon: Leandrew Koyanagi,  MD;  Location: [place];  Service: Orthopedics;  Laterality: Right;  . SUBMUCOSAL LIFTING INJECTION  12/09/2019   Procedure: SUBMUCOSAL LIFTING INJECTION;  Surgeon: Rush Landmark Telford Nab., MD;  Location: [facility];  Service: Gastroenterology;;  . TEE WITHOUT CARDIOVERSION N/A 08/01/2016   Procedure: TRANSESOPHAGEAL ECHOCARDIOGRAM (TEE);  Surgeon: Lelon Perla, MD;  Location: [facility] ENDOSCOPY;  Service: Cardiovascular;  Laterality: N/A;  . THYROIDECTOMY  11/24/2013   DR Dalbert Batman  . THYROIDECTOMY N/A 11/24/2013   Procedure: TOTAL THYROIDECTOMY;  Surgeon: Adin Hector, MD;  Location: [place];  Service: General;  Laterality: N/A;  . TRANSTHORACIC ECHOCARDIOGRAM  01/15/2013   EF 55% TO 65%. PROBABLE MILD HYPOKINESIS OF THE INFERIOR MYOCARDIUM. GRADE 1 DIASTOLIC DYSFUNCTION. TRIAL AR.LA IS MILDLY DILATED.   Social History   Social History Narrative  . Not on file   Immunization History  Administered Date(s) Administered  . Fluad Quad(high Dose 65+) 08/22/2019  . Influenza Nasal 11/25/2013  . Influenza, High Dose Seasonal PF 12/30/2014, 10/14/2017, 11/01/2018  . Influenza,inj,Quad PF,6+ Mos 11/25/2013  . Influenza-Unspecified 12/30/2012, 11/09/2015, 12/24/2016, 09/16/2020  . PFIZER(Purple Top)SARS-COV-2 Vaccination 12/29/2019, 01/18/2020, 09/16/2020  . Pneumococcal Conjugate-13 12/30/2014, 10/14/2017  . Pneumococcal Polysaccharide-23 11/25/2013, 08/22/2019  . Zoster 12/15/2008  . Zoster Recombinat (Shingrix) 10/14/2017, 01/21/2018     Objective: Vital Signs: BP 112/63 (BP Location: Right Arm, Patient Position: Sitting, Cuff Size: Normal)   Pulse 80   Resp 16   Ht 5\' 5"  (1.651 m)   Wt 221 lb (100.2 kg)   BMI 36.78 kg/m    Physical Exam Vitals and nursing note reviewed.  Constitutional:      Appearance: She is well-developed.  HENT:     Head: Normocephalic and atraumatic.  Eyes:     Conjunctiva/sclera: Conjunctivae normal.  Cardiovascular:     Rate and Rhythm: Normal rate and  regular rhythm.     Heart sounds: Normal heart sounds.  Pulmonary:     Effort: Pulmonary effort is normal.     Breath sounds: Normal breath sounds.  Abdominal:     General: Bowel sounds are normal.     Palpations: Abdomen is soft.  Musculoskeletal:     Cervical back: Normal range of motion.  Lymphadenopathy:     Cervical: No cervical adenopathy.  Skin:    General: Skin is warm and dry.     Capillary Refill: Capillary refill takes less than 2 seconds.  Neurological:     Mental Status: She is alert and oriented to person, place, and time.     Comments: Titubation noted  Psychiatric:        Behavior: Behavior normal.      Musculoskeletal Exam: C-spine was in good range of motion.  Shoulder joints, elbow joints, wrist joints, MCPs PIPs and DIPs in good range of motion with no synovitis.  Hip joints, knee joints, ankles, MTPs with good range of motion with no synovitis.  CDAI Exam: CDAI Score: -- Patient Global: --; Provider Global: --  Swollen: --; Tender: -- Joint Exam 04/18/2021   No joint exam has been documented for this visit   There is currently no information documented on the homunculus. Go to the Rheumatology activity and complete the homunculus joint exam.  Investigation: No additional findings.  Imaging: No results found.  Recent Labs: Lab Results  Component Value Date   WBC 3.4 03/24/2021   HGB 16.6 (H) 03/24/2021   PLT 206 03/24/2021   NA 143 03/24/2021   K 4.1 03/24/2021   CL 102 03/24/2021   CO2 25 03/24/2021   GLUCOSE 85 03/24/2021   BUN 17 03/24/2021   CREATININE 1.01 (H) 03/24/2021   BILITOT 0.5 03/24/2021   ALKPHOS 103 03/24/2021   AST 22 03/24/2021   ALT 16 03/24/2021   PROT 8.3 03/24/2021   ALBUMIN 4.9 (H) 03/24/2021   CALCIUM 9.9 03/24/2021   GFRAA 74 09/16/2020    Speciality Comments: No specialty comments available.  Procedures:  No procedures performed Allergies: Labetalol and Codeine   Assessment / Plan:     Visit Diagnoses:  Idiopathic chronic gout of multiple sites without tophus - uric acid: 10/18/2020 4.5 -she has been taking allopurinol on a daily basis.  She has not had a gout flare in a long time.  Plan: Uric acid  Hyperuricemia - allopurinol 100 mg 1 tablet by mouth daily.  Trigger finger, left ring finger-I have advised her to massage her finger as she does not want a cortisone injection at this point.  If her symptoms persist she may consider cortisone injection in the future.  History of vitamin D deficiency-vitamin D was normal in May 2021.  History of hypertension-her blood pressure is normal today.  History of hyperlipidemia-she has gained weight.  Weight loss diet and exercise was discussed.  History of coronary artery disease  History of atrial fibrillation-she is on Eliquis.  Chronic anticoagulation  History of hypothyroidism  OSA on CPAP  Orders: Orders Placed This Encounter  Procedures  . Uric acid   No orders of the defined types were placed in this encounter.   Follow-Up Instructions: Return in about 6 months (around 10/19/2021) for Gout, Osteoarthritis.   Bo Merino, MD  Note - This record has been created using Editor, commissioning.  Chart creation errors have been sought, but may not always  have been located. Such creation errors do not reflect on  the standard of medical care.

## 2021-04-12 DIAGNOSIS — Z1231 Encounter for screening mammogram for malignant neoplasm of breast: Secondary | ICD-10-CM | POA: Diagnosis not present

## 2021-04-12 LAB — HM MAMMOGRAPHY

## 2021-04-13 ENCOUNTER — Encounter: Payer: Self-pay | Admitting: Internal Medicine

## 2021-04-17 ENCOUNTER — Other Ambulatory Visit: Payer: Self-pay | Admitting: Family Medicine

## 2021-04-17 MED ORDER — SYNTHROID 88 MCG PO TABS: 88.0000 ug | ORAL_TABLET | Freq: Every day | ORAL | 0 refills | Status: DC

## 2021-04-17 NOTE — Telephone Encounter (Signed)
Pt has been taking 100mg  of thyroid because she lost her 32mcg when traveling, I have sent in a new rx of synthyroid 56mcg to pharmacy

## 2021-04-18 ENCOUNTER — Encounter: Payer: Self-pay | Admitting: Rheumatology

## 2021-04-18 ENCOUNTER — Ambulatory Visit: Payer: PPO | Admitting: Rheumatology

## 2021-04-18 ENCOUNTER — Other Ambulatory Visit: Payer: Self-pay

## 2021-04-18 VITALS — BP 112/63 | HR 80 | Resp 16 | Ht 65.0 in | Wt 221.0 lb

## 2021-04-18 DIAGNOSIS — R260 Ataxic gait: Secondary | ICD-10-CM | POA: Diagnosis not present

## 2021-04-18 DIAGNOSIS — M65342 Trigger finger, left ring finger: Secondary | ICD-10-CM | POA: Diagnosis not present

## 2021-04-18 DIAGNOSIS — M1A09X0 Idiopathic chronic gout, multiple sites, without tophus (tophi): Secondary | ICD-10-CM

## 2021-04-18 DIAGNOSIS — Z7901 Long term (current) use of anticoagulants: Secondary | ICD-10-CM

## 2021-04-18 DIAGNOSIS — Z9989 Dependence on other enabling machines and devices: Secondary | ICD-10-CM

## 2021-04-18 DIAGNOSIS — G8929 Other chronic pain: Secondary | ICD-10-CM

## 2021-04-18 DIAGNOSIS — E790 Hyperuricemia without signs of inflammatory arthritis and tophaceous disease: Secondary | ICD-10-CM

## 2021-04-18 DIAGNOSIS — Z8639 Personal history of other endocrine, nutritional and metabolic disease: Secondary | ICD-10-CM

## 2021-04-18 DIAGNOSIS — G4733 Obstructive sleep apnea (adult) (pediatric): Secondary | ICD-10-CM

## 2021-04-18 DIAGNOSIS — Z8679 Personal history of other diseases of the circulatory system: Secondary | ICD-10-CM | POA: Diagnosis not present

## 2021-04-19 LAB — URIC ACID: Uric Acid, Serum: 4.2 mg/dL (ref 2.5–7.0)

## 2021-04-20 ENCOUNTER — Encounter: Payer: Self-pay | Admitting: Neurology

## 2021-04-24 ENCOUNTER — Ambulatory Visit (HOSPITAL_COMMUNITY): Payer: PPO | Attending: Cardiology

## 2021-04-24 ENCOUNTER — Other Ambulatory Visit: Payer: Self-pay

## 2021-04-24 DIAGNOSIS — Z9861 Coronary angioplasty status: Secondary | ICD-10-CM

## 2021-04-24 DIAGNOSIS — I2510 Atherosclerotic heart disease of native coronary artery without angina pectoris: Secondary | ICD-10-CM

## 2021-04-24 DIAGNOSIS — I10 Essential (primary) hypertension: Secondary | ICD-10-CM

## 2021-04-24 DIAGNOSIS — I4819 Other persistent atrial fibrillation: Secondary | ICD-10-CM

## 2021-04-24 LAB — ECHOCARDIOGRAM COMPLETE: S' Lateral: 2.9 cm

## 2021-04-24 MED ORDER — PERFLUTREN LIPID MICROSPHERE
1.0000 mL | INTRAVENOUS | Status: AC | PRN
  Administered 2021-04-24: 2 mL via INTRAVENOUS

## 2021-04-25 NOTE — Progress Notes (Signed)
Assessment/Plan:   1.  Cervical Dystonia  -I talked to the patient about the nature and pathophysiology.  The patient is not having trouble with ADL's or with rotation of the head for driving.  In fact, the patient really does not notice it.  This is chronic in nature, lasting over 10 years.  My suspicion is that this likely would not get much worse, and it is already mild.  Because it is not bothersome to the patient, and because she is on Eliquis which would make Botox difficult and more risky, I really recommended that we hold off on any type of treatment.  Discussed with the patient that if it gets worse, we could reconsider Botox treatments.  Discussed with her that oral medications generally do not help.  Again, this really has not been that bothersome for her.  Reassurance was provided.  2.  Hypothyroidism  -Patient asks for referral to endocrinology.  We will go ahead and provide that.  If more information is needed, she will need to get that referral from her primary care provider.  3.  Follow-up as needed Subjective:   Claudia Cantrell was seen today in neurologic consultation at the request of Bo Merino, MD.  The consultation is for the evaluation of head tremor.  She doesn't know when head tremor started but she has noted that others have told her for 10 years that she has had head tremor.  Pt doesn't even feel it.  She doesn't know the direction of the tremor.   Tremor is not bothersome to the patient with the exception of the fact that others have pointed it out to her.  No fam hx of tremor.  No hand tremor.  She is L hand dominant.   No trouble turning the head when driving.  She thinks that she is "backward" in general.  When reading, she generally goes from the back of a book to the front.       Last neuroimaging of the brain was in November, 2016.  This demonstrated focal area of signal loss, felt to be potentially dural calcification in the right middle cranial fossa.  That  he did not think that this was a meningioma as it did not enhance.  I personally reviewed the images.  No history of neuroimaging of the cervical spine.  On a separate note, the patient asks for referral to endocrinology for her hypothyroidism.  States that she has been taking more of her Synthroid than prescribed because she thinks it helps feeling of palpitations.    ALLERGIES:   Allergies  Allergen Reactions  . Labetalol     headaches  . Codeine Anxiety    CURRENT MEDICATIONS:  Outpatient Encounter Medications as of 04/26/2021  Medication Sig  . acetaminophen (TYLENOL) 650 MG CR tablet Take 1,300 mg by mouth every 8 (eight) hours as needed for pain.   Marland Kitchen allopurinol (ZYLOPRIM) 100 MG tablet Take 100 mg by mouth in the morning.  Marland Kitchen amLODipine (NORVASC) 5 MG tablet Take 1.5 tablets (7.5 mg total) by mouth every evening.  Marland Kitchen apixaban (ELIQUIS) 5 MG TABS tablet Take 1 tablet (5 mg total) by mouth 2 (two) times daily.  . carvedilol (COREG) 25 MG tablet Take 1 tablet (25 mg total) by mouth 2 (two) times daily with a meal.  . Cod Liver Oil (COD LIVER PO) Take 1 tablet by mouth 2 (two) times a week.  Marland Kitchen FARXIGA 10 MG TABS tablet Take 10 mg by mouth  in the morning.  . hydrALAZINE (APRESOLINE) 10 MG tablet Take 1 tablet (10 mg total) by mouth 2 (two) times daily.  . hydrochlorothiazide (MICROZIDE) 12.5 MG capsule Take 1 capsule (12.5 mg total) by mouth in the morning.  . isosorbide dinitrate (ISORDIL) 5 MG tablet Take 1 tablet (5 mg total) by mouth 2 (two) times daily.  . Magnesium 400 MG TABS Take 400 mg by mouth every evening.  . Multiple Vitamins-Minerals (MULTIVITAMIN WITH MINERALS) tablet Take 1 tablet by mouth daily in the afternoon.  Marland Kitchen olmesartan (BENICAR) 40 MG tablet Take 40 mg by mouth in the morning.  . Probiotic Product (PROBIOTIC PO) Take 1 capsule by mouth at bedtime.  . rosuvastatin (CRESTOR) 40 MG tablet Take 1 tablet (40 mg total) by mouth daily. (Patient taking differently: Take  40 mg by mouth in the morning.)  . SYNTHROID 88 MCG tablet Take 1 tablet (88 mcg total) by mouth daily.  . Vitamin D3 (VITAMIN D) 25 MCG tablet Take 1,000 Units by mouth daily in the afternoon.  . zinc gluconate 50 MG tablet Take 50 mg by mouth daily in the afternoon.   No facility-administered encounter medications on file as of 04/26/2021.    Objective:   PHYSICAL EXAMINATION:    VITALS:   Vitals:   04/26/21 0839  BP: 110/88  Pulse: 88  SpO2: 96%  Weight: 216 lb (98 kg)  Height: 5\' 5"  (1.651 m)    GEN:  Normal appears female in no acute distress.  Appears stated age. HEENT:  Normocephalic, atraumatic. The mucous membranes are moist. The superficial temporal arteries are without ropiness or tenderness. Cardiovascular: Regular rate and rhythm.  (No A. fib today) Lungs: Clear to auscultation bilaterally. Neck/Heme: There are no carotid bruits noted bilaterally.  Neck is generally held in the midline position until she starts to do something like walk down the hall and then the left ear approximates the left shoulder with slight turn of the head to the right.  NEUROLOGICAL: Orientation:  The patient is alert and oriented x 3.   Cranial nerves: There is good facial symmetry.  Extraocular muscles are intact and visual fields are full to confrontational testing. Speech is fluent and clear. Soft palate rises symmetrically and there is no tongue deviation. Hearing is intact to conversational tone. Tone: Tone is good throughout. Sensation: Sensation is intact to light touch and pinprick throughout (facial, trunk, extremities). Vibration is intact at the bilateral ankle. There is no extinction with double simultaneous stimulation. There is no sensory dermatomal level identified. Coordination:  The patient has no difficulty with RAM's or FNF bilaterally. Motor: Strength is 5/5 in the bilateral upper and lower extremities.  Shoulder shrug is equal and symmetric. There is no pronator drift.   There are no fasciculations noted. DTR's: Deep tendon reflexes are 2-/4 at the bilateral biceps, triceps, brachioradialis, patella and 1/4 at the bilateral Achilles.  Plantar responses are downgoing bilaterally. Gait and Station: The patient is able to ambulate without difficulty.  Abnormal movement:   The L ear approximates the L shoulder.  Head tremor is rare, and really only present when she is distracted with other tasks.  Head tremor is complex in its nature and more circular than "yes" or "no."  She pours water from 1 glass to another without any trouble.    Total time spent on today's visit was 45 minutes, including both face-to-face time and nonface-to-face time.  Time included that spent on review of records (prior notes available  to me/labs/imaging if pertinent), discussing treatment and goals, answering patient's questions and coordinating care.   Cc:  Henson, Vickie L, NP-C

## 2021-04-26 ENCOUNTER — Ambulatory Visit: Payer: PPO | Admitting: Neurology

## 2021-04-26 ENCOUNTER — Encounter: Payer: Self-pay | Admitting: Neurology

## 2021-04-26 ENCOUNTER — Other Ambulatory Visit: Payer: Self-pay

## 2021-04-26 VITALS — BP 110/88 | HR 88 | Ht 65.0 in | Wt 216.0 lb

## 2021-04-26 DIAGNOSIS — G243 Spasmodic torticollis: Secondary | ICD-10-CM

## 2021-04-26 DIAGNOSIS — E039 Hypothyroidism, unspecified: Secondary | ICD-10-CM

## 2021-04-26 NOTE — Addendum Note (Signed)
Addended by: Alonza Bogus S on: 04/26/2021 09:40 AM   Modules accepted: Level of Service

## 2021-05-03 DIAGNOSIS — I129 Hypertensive chronic kidney disease with stage 1 through stage 4 chronic kidney disease, or unspecified chronic kidney disease: Secondary | ICD-10-CM | POA: Diagnosis not present

## 2021-05-03 DIAGNOSIS — E559 Vitamin D deficiency, unspecified: Secondary | ICD-10-CM | POA: Diagnosis not present

## 2021-05-03 DIAGNOSIS — N281 Cyst of kidney, acquired: Secondary | ICD-10-CM | POA: Diagnosis not present

## 2021-05-03 DIAGNOSIS — R809 Proteinuria, unspecified: Secondary | ICD-10-CM | POA: Diagnosis not present

## 2021-05-03 DIAGNOSIS — R7303 Prediabetes: Secondary | ICD-10-CM | POA: Diagnosis not present

## 2021-05-03 DIAGNOSIS — N182 Chronic kidney disease, stage 2 (mild): Secondary | ICD-10-CM | POA: Diagnosis not present

## 2021-05-04 ENCOUNTER — Other Ambulatory Visit: Payer: Self-pay

## 2021-05-04 ENCOUNTER — Ambulatory Visit: Payer: PPO | Admitting: Cardiovascular Disease

## 2021-05-04 ENCOUNTER — Encounter: Payer: Self-pay | Admitting: Cardiovascular Disease

## 2021-05-04 VITALS — BP 118/80 | HR 75 | Ht 65.0 in | Wt 219.0 lb

## 2021-05-04 DIAGNOSIS — Z7901 Long term (current) use of anticoagulants: Secondary | ICD-10-CM | POA: Diagnosis not present

## 2021-05-04 DIAGNOSIS — G4733 Obstructive sleep apnea (adult) (pediatric): Secondary | ICD-10-CM

## 2021-05-04 DIAGNOSIS — Z9989 Dependence on other enabling machines and devices: Secondary | ICD-10-CM

## 2021-05-04 DIAGNOSIS — Z9861 Coronary angioplasty status: Secondary | ICD-10-CM

## 2021-05-04 DIAGNOSIS — E785 Hyperlipidemia, unspecified: Secondary | ICD-10-CM

## 2021-05-04 DIAGNOSIS — I2510 Atherosclerotic heart disease of native coronary artery without angina pectoris: Secondary | ICD-10-CM

## 2021-05-04 DIAGNOSIS — D481 Neoplasm of uncertain behavior of connective and other soft tissue: Secondary | ICD-10-CM

## 2021-05-04 DIAGNOSIS — I4819 Other persistent atrial fibrillation: Secondary | ICD-10-CM | POA: Diagnosis not present

## 2021-05-04 NOTE — Progress Notes (Signed)
Claudia Cantrell is a 75 y.o. female who presents for annual wellness visit, and follow-up on chronic medical conditions.  She has the following concerns:  She sees cardiology for HTN, A-fib, CHF, HL and other cardiac issues.  On Eliquis and no bleeding concerns   Prediabetes-reports that she has not been eating as healthy as she would like  Vitamin D - taking a vitamin D supplement  On statin daily   States neurologist ruled out Parkinsons. States she is not sure what is causing her tremors.   OSA on CPAP.     Immunization History  Administered Date(s) Administered  . Fluad Quad(high Dose 65+) 08/22/2019  . Influenza Nasal 11/25/2013  . Influenza, High Dose Seasonal PF 12/30/2014, 10/14/2017, 11/01/2018  . Influenza,inj,Quad PF,6+ Mos 11/25/2013  . Influenza-Unspecified 12/30/2012, 11/09/2015, 12/24/2016, 09/16/2020  . PFIZER(Purple Top)SARS-COV-2 Vaccination 12/29/2019, 01/18/2020, 09/16/2020  . Pneumococcal Conjugate-13 12/30/2014, 10/14/2017  . Pneumococcal Polysaccharide-23 11/25/2013, 08/22/2019  . Zoster Recombinat (Shingrix) 10/14/2017, 01/21/2018  . Zoster, Live 12/15/2008   Last Pap smear: hysterectomy Last mammogram: 04/12/2021 Last colonoscopy: overdue and is aware. States she will have this in July.  Working on getting last colonoscopy report from [place].  Last DEXA: 2017 and normal  Dentist: years ago  Ophtho: [facility] in 2020 Exercise: walking  Other doctors caring for patient include: Dr. Claiborne Billings- Cardiology Dr. Rush Landmark- GI Dr. Burr Medico oncology Dr. Estanislado Pandy- Rheumatology Dr. Carles Collet- Neurology  Depression screen:  See questionnaire below.  Depression screen [facility] 2/9 05/05/2021 04/20/2020 01/14/2019 01/13/2018 12/11/2017  Decreased Interest 0 0 0 0 0  Down, Depressed, Hopeless 0 0 1 0 0  PHQ - 2 Score 0 0 1 0 0  Some recent data might be hidden    Fall Risk Screen: see questionnaire below. Fall Risk  05/05/2021 04/20/2020 01/13/2018 08/29/2016  Falls in the past  year? 0 0 No No  Number falls in past yr: 0 0 - -  Injury with Fall? 0 0 - -  Risk for fall due to : No Fall Risks - - -  Follow up Falls evaluation completed - - -    ADL screen:  See questionnaire below Functional Status Survey: Is the patient deaf or have difficulty hearing?: No Does the patient have difficulty seeing, even when wearing glasses/contacts?: Yes (close up) Does the patient have difficulty concentrating, remembering, or making decisions?: No Does the patient have difficulty walking or climbing stairs?: Yes Does the patient have difficulty dressing or bathing?: No Does the patient have difficulty doing errands alone such as visiting a doctor's office or shopping?: No   End of Life Discussion:  Patient does not have a living will and medical power of attorney. Paperwork provided and discussed. MOST form reviewed and signed.   Review of Systems Constitutional: -fever, -chills, -sweats, -unexpected weight change, -anorexia, -fatigue Allergy: -sneezing, -itching, -congestion Dermatology: denies changing moles, rash, lumps, new worrisome lesions ENT: -runny nose, -ear pain, -sore throat, -hoarseness, -sinus pain, -teeth pain, -tinnitus, -hearing loss, -epistaxis Cardiology:  -chest pain, -palpitations, -edema, -orthopnea, -paroxysmal nocturnal dyspnea Respiratory: -cough, -shortness of breath, -dyspnea on exertion, -wheezing, -hemoptysis Gastroenterology: -abdominal pain, -nausea, -vomiting, -diarrhea, -constipation, -blood in stool, -changes in bowel movement, -dysphagia Hematology: -bleeding or bruising problems Musculoskeletal: -arthralgias, -myalgias, -joint swelling, -back pain, -neck pain, -cramping, -gait changes Ophthalmology: -vision changes, -eye redness, -itching, -discharge Urology: -dysuria, -difficulty urinating, -hematuria, -urinary frequency, -urgency, incontinence Neurology: -headache, -weakness, -tingling, -numbness, -speech abnormality, -memory loss,  -falls, -dizziness Psychology:  -depressed mood, -agitation, -sleep  problems    PHYSICAL EXAM:  BP 110/70   Pulse 68   Ht 5\' 4"  (1.626 m)   Wt 214 lb 9.6 oz (97.3 kg)   SpO2 97%   BMI 36.84 kg/m   General Appearance: Alert, cooperative, no distress, appears stated age Head: Normocephalic, without obvious abnormality, atraumatic Eyes: PERRL, conjunctiva/corneas clear, EOM's intact Ears: Normal TM's and external ear canals Nose: mask on  Throat: mask on  Neck: Supple, no lymphadenopathy; thyroid: no enlargement/tenderness/nodules Back: Spine nontender, no curvature, ROM normal, no CVA tenderness Lungs: Clear to auscultation bilaterally without wheezes, rales or ronchi; respirations unlabored Chest Wall: No tenderness or deformity Heart: Regular rate and rhythm, S1 and S2 normal, no murmur, rub or gallop Breast Exam: No tenderness, masses, or nipple discharge or inversion. No axillary lymphadenopathy Abdomen: Soft, non-tender, nondistended, normoactive bowel sounds, no masses, no hepatosplenomegaly Genitalia: declines  Extremities: No clubbing, cyanosis or edema Pulses: 2+ and symmetric all extremities Skin: Skin color, texture, turgor normal, no rashes or lesions Lymph nodes: Cervical, supraclavicular, and axillary nodes normal Neurologic: CNII-XII intact, normal strength, sensation and gait Psych: Normal mood, affect, hygiene and grooming.  ASSESSMENT/PLAN: Medicare annual wellness visit, subsequent -Denies any concerns with ADLs, memory, mood or falls.  Advanced directive counseling done.  Reviewed medications.  Reviewed recent labs and visits with specialist.  Essential hypertension -Controlled.  Continue current medication.  Continue follow-up with cardiology as recommended  Prediabetes -Reviewed labs including her last A1c.  Recommend she eat a healthier diet and cut back on carbohydrates.  I also recommend that she increase her physical activity.  She will follow-up in  3 months  Dyslipidemia, goal LDL below 70 -Continue statin therapy and low-fat diet.  Follow-up with cardiology as recommended  Persistent atrial fibrillation ([place]) -Asymptomatic.  Follow-up with cardiology as recommended.  Continue anticoagulation  Chronic anticoagulation -No concerns regarding bleeding or bruising issues.  Postsurgical hypothyroidism -Continue medication.  Follow-up in 3 months  OSA on CPAP -Continue CPAP use  Vitamin D deficiency -Continue vitamin D supplement  Immunization counseling  Advance directive discussed with patient -In-depth counseling on advanced directives and paperwork provided.  MOST form reviewed and signed     Discussed monthly self breast exams and yearly mammograms; at least 30 minutes of aerobic activity at least 5 days/week and weight-bearing exercise 2x/week; proper sunscreen use reviewed; healthy diet, including goals of calcium and vitamin D intake and alcohol recommendations (less than or equal to 1 drink/day) reviewed; regular seatbelt use; changing batteries in smoke detectors.  Immunization recommendations discussed.  Colonoscopy recommendations reviewed   Medicare Attestation I have personally reviewed: The patient's medical and social history Their use of alcohol, tobacco or illicit drugs Their current medications and supplements The patient's functional ability including ADLs,fall risks, home safety risks, cognitive, and hearing and visual impairment Diet and physical activities Evidence for depression or mood disorders  The patient's weight, height, and BMI have been recorded in the chart.  I have made referrals, counseling, and provided education to the patient based on review of the above and I have provided the patient with a written personalized care plan for preventive services.     Harland Dingwall, NP-C   05/08/2021

## 2021-05-04 NOTE — Progress Notes (Signed)
Patient ID: Claudia Cantrell, female   DOB: 11-Jun-1946, 75 y.o.   MRN: [id number]     HPI: Claudia Cantrell is a 75 y.o. female who presents to the office today for a 6 week follow-up evaluation.  Claudia Cantrell suffered remote myocardial infarctions in 1991 and 1992 and at that time underwent PTCA by Dr. Lamount Cohen.  She has a history of hypertension, hyperlipidemia, obstructive sleep apnea on CPAP therapy, obesity, and hypothyroidism with multinodular goiter. In October 2011 she underwent cardiac catheterization after she had experienced episodes of chest pain. Catheterization showed preserved LV contractility although the was a very small focal area of severe hypocontractility involving the mid inferior wall as well as low posterior lateral wall in this patient status post remote myocardial infarction approximately 20 years ago treated with PTCA. At the time of her catheterization she did not have significant carotid obstructive disease and only had mild 10-20% narrowing at the ostium of the LAD, 20% irregularity in the mid AV groove circumflex, and 20% mid RCA and 20% posterior lateral irregularities. Subsequently, she has done well from a cardiovascular standpoint.  She is a history of a multinodular goiter and in 2014, underwent thyroidectomy and tolerated this well from a cardiovascular standpoint.  Since I saw her, she broke her leg in 2016.  She has noticed some increasing shortness of breath particularly when she eats chocolate.  She has been followed by Dr. Jeanann Lewandowsky, for primary care.  She developed atrial fibrillation this year and was admitted August 2017 with AF with rapid ventricular response.  With a cha2ds2vasc score of 5.  She was started on eliquis..  She underwent successful TEE guided cardioversion with restoration of sinus rhythm.  She saw [name] on 08/08/2016 for hospital follow-up and was doing well.  An echo Doppler study in the setting of AF showed an ejection fraction of  40-45%.  She underwent a nuclear perfusion study on August 30 117, which was low risk and suggested the possibility of a small basal to mid inferolateral defect.  There was no ischemia.  She has obstructive sleep apnea and has been on CPAP therapy since 2011.  When I saw her in October 2017, her CPAP machine was broken.  She was 100% compliant and on her prior sleep study.  She had been utilizing CPAP at 9 cm water pressure.    She received a new Medical illustrator  Sense 10 AutoSet for her CPAP unit from [employer] care as a DME company.  Two-month of compliance data were reviewed.  11/06/2016 through 12/05/2016 usage stays was 97% and usage greater than 4 hours was 97%.  She was averaging 7 hours and 9 minutes.  AHI was excellent at 0.9 at her 9 cm water pressure.  There was no leak.  She had had issues with intermittent function over new machine, which was under warranty.  This has been followed up with her DME company.   She was seen by [name], [place] in September, October, and November 2018.  She has had issues with lower extremity edema and some chest congestion.  She had been using NyQuil.  Her levothyroxine dose was increased. .  She has been taking amlodipine 5 mg, Toprol-XL 50 mg for hypertension.  Levothyroxine 112 g for hypothyroidism.  She is on eliquis and denies any recent awareness of abnormal heart rhythm and is unaware of any recurrent atrial fibrillation.  She is on rosuvastatin 40 mg for hyperlipidemia.  When I saw her  in January 2019 a download was obtained from 11/16/2017 through 12/15/2017.  This showed 93% of usage stays with average use is at 7 hours and 26 minutes.  She is at 9 cm water pressure.  AHI is excellent at 0.7.  There is some occasional leaking her mask.  During that visit, her blood pressure was elevated and I suggested a slight titration of amlodipine from 5 mg up to 7.5 mg daily and that she continue her current dose of Toprol-XL 50 mg.   I last saw her in April 2019 at which time  her blood pressure was elevated and I added losartan 25 mg to take in addition to her amlodipine and Toprol for more optimal blood pressure control.  She was using CPAP with 100% compliant.  BMI was 37.94 and weight loss as well as exercise was recommended.   I saw her in July 2019 she was thankful for the time that I spent with her at her office visit discussing the importance of weight loss exercise improve diet.  Over the past several months she  lost 12 pounds.  She felt better and had more energy.  She denied chest pain or shortness of breath.  She had changed her diet.    I last saw her in March 2020 at which time she continued to feel well.  She continued sto use CPAP with 100% compliance.  She was not aware, however, that she could take her device on an airplane.  Her most recent download from January 11, 2019 through February 09, 2019 for this reason showed 87% of usage days due to the fact that she had flown up to [place] to spend 4 days with her daughter.  At a set pressure of 9 cm, AHI was 3.9.  There was no leak.  She has continued to have an improved diet.  She was on amlodipine 7.5 mg, Toprol-XL 50 mg and olmesartan 20 mg for hypertension and rosuvastatin 40 mg for hyperlipidemia.  She continuesdto take levothyroxine 112 mcg for hypothyroidism.    Since I last saw her, she has developed episodes of recurrent atrial fibrillation and has undergone 2 subsequent cardioversions initially in February 2021 by Dr. Johnsie Cancel where she was returned to sinus rhythm and most recently by Dr. Gilman Schmidt in July 2021.  She is also followed by GI and has a malignant stromal tumor of the stomach.    I last saw her on March 24, 2021.  Over the prior months she had noticed that she became short of breath particularly when walking steps and has noticed more fatigability.  She denied any chest pain.  Her blood pressure at home has been running in the 140/90 range.  She continues to be on amlodipine 7.5 mg, carvedilol 25 mg  in the morning and 12.5 mg at night, hydralazine 10 mg twice a day, HCTZ 12.5 mg, isosorbide dinitrate 5 mg twice a day, and olmesartan 40 mg daily.  She is on Eliquis for anticoagulation 5 mg twice a day.  She is on levothyroxine 88 mcg daily for hypothyroidism and rosuvastatin 40 mg for hyperlipidemia.  During that evaluation, her initial blood pressure was 164/82 and repeat blood pressure by me 134/80.  Her ECG showed atrial fibrillation at 75 bpm with nonspecific ST-T changes.  At that time I recommended she further titrate carvedilol to 25 mg twice a day.  I scheduled her for follow-up echo Doppler study for reassessment of LV systolic and diastolic function and recommended laboratory.  Depending upon her response I discussed the possibility of changing amlodipine to Cardizem or consideration of potential antiarrhythmic therapy.  She continues to be CPAP compliant 100%.  Her AHI was excellent at 1.4 and her CPAP auto range was between 9 and 14 cm of water with 95th percentile pressure 12.2.  Over the past 6 weeks, she has felt better and is able to walk up steps without significant fatigability.  She is less short of breath.  She continues to use CPAP with 100% compliance.  She is on Eliquis 5 mg twice a day for anticoagulation.  She is on carvedilol 25 mg twice a day, low-dose BiDil with  hydralazine 10 mg twice a day and low-dose isosorbide, HCTZ 12.5 mg, olmesartan 40 mg daily and [place]. She is on levothyroxine 88 mcg for hypothyroidism. A 2D echo Doppler study on Apr 24, 2021 showed normal EF at 55 to 60% with mild LVH, mild mitral regurgitation, and her left atrium is only mildly dilated.  She presents for follow-up evaluation.  Past Medical History:  Diagnosis Date  . Arthritis    rheumatoid  . CAD (coronary artery disease)    a. 1992 s/p MI and PTCA of unknown vessel;  b. 09/2010 Cath: LM nl, LAD 20p, LCX 37m RCA 164mRPL 20.  . Marland Kitchenervical cancer ([facility]1977  . Chronic kidney disease   .  Family history of anesthesia complication    daughter has difficulty waking   . GERD (gastroesophageal reflux disease)    occ  . Gout 10/08/2016  . Hyperlipidemia   . Hypertensive heart disease   . Hypothyroidism   . Nonischemic cardiomyopathy ([facility]   a. 07/2016 Echo: EF 40-45%, mild LVH, inferior akinesis, moderately dilated left atrium, trivial AI and MR.  . Marland KitchenAF (paroxysmal atrial fibrillation) ([place]   a. 07/2016 Admitted w/ AF RVR-->CHA2DS2VASc = 5-->Eliquis;  b. 07/2016 successful TEE/DCCV.  . Marland Kitchenleep apnea    a. Using CPAP.    Past Surgical History:  Procedure Laterality Date  . ABDOMINAL HYSTERECTOMY  1988  . BACK SURGERY  1994  . BIOPSY  09/02/2019   Procedure: BIOPSY;  Surgeon: MaRush LandmarkaTelford Nab MD;  [facility];  Service: Gastroenterology;;  . BIOPSY  12/09/2019   Procedure: BIOPSY;  Surgeon: MaIrving Copas MD;  [facility];  Service: Gastroenterology;;  . BIOPSY  03/08/2021   Procedure: BIOPSY;  Surgeon: MaIrving Copas MD;  [facility];  Service: Gastroenterology;;  . [place] PTCA BY DR CHLamount Cohen. CARDIOVERSION N/A 08/01/2016   Procedure: CARDIOVERSION;  Surgeon: BrLelon PerlaMD;  [facility];  Service: Cardiovascular;  Laterality: N/A;  . CARDIOVERSION N/A 01/11/2020   Procedure: CARDIOVERSION;  Surgeon: NiJosue HectorMD;  [facility];  Service: Cardiovascular;  Laterality: N/A;  . CARDIOVERSION N/A 07/08/2020   Procedure: CARDIOVERSION;  Surgeon: ScDonato HeinzMD;  [facility]: Cardi[facility];  Laterality: N/A;  . ENDOSCOPIC MUCOSAL RESECTION N/A 12/09/2019   Procedure: ENDOSCOPIC MUCOSAL RESECTION;  Surgeon: MaRush LandmarkaTelford Nab MD;  [facility];  Service: Gastroenterology;  Laterality: N/A;  . ESOPHAGOGASTRODUODENOSCOPY N/A 03/08/2021   Procedure: ESOPHAGOGASTRODUODENOSCOPY (EGD);  Surgeon:  MaIrving Copas MD;  [facility];  Service: Gastroenterology;  Laterality: N/A;  . ESOPHAGOGASTRODUODENOSCOPY (EGD) WITH PROPOFOL N/A 09/02/2019   Procedure: ESOPHAGOGASTRODUODENOSCOPY (EGD) WITH PROPOFOL;  Surgeon: MaRush LandmarkaTelford Nab MD;  [facility];  Service: Gastroenterology;  Laterality:  N/A;  . ESOPHAGOGASTRODUODENOSCOPY (EGD) WITH PROPOFOL N/A 12/09/2019   Procedure: ESOPHAGOGASTRODUODENOSCOPY (EGD) WITH PROPOFOL;  Surgeon: Rush Landmark Telford Nab., MD;  [facility];  Service: Gastroenterology;  Laterality: N/A;  . EUS N/A 09/02/2019   Procedure: UPPER ENDOSCOPIC ULTRASOUND (EUS) RADIAL;  Surgeon: Rush Landmark Telford Nab., MD;  [facility];  Service: Gastroenterology;  Laterality: N/A;  EUS radial/linear  . EUS N/A 12/09/2019   Procedure: UPPER ENDOSCOPIC ULTRASOUND (EUS) RADIAL;  Surgeon: Irving Copas., MD;  [facility];  Service: Gastroenterology;  Laterality: N/A;  . EUS  12/09/2019   Procedure: UPPER ENDOSCOPIC ULTRASOUND (EUS) LINEAR;  Surgeon: Irving Copas., MD;  [facility];  Service: Gastroenterology;;  . EUS N/A 03/08/2021   Procedure: UPPER ENDOSCOPIC ULTRASOUND (EUS) RADIAL;  Surgeon: Irving Copas., MD;  [facility];  Service: Gastroenterology;  Laterality: N/A;  . FINE NEEDLE ASPIRATION  09/02/2019   Procedure: FINE NEEDLE ASPIRATION (FNA) LINEAR;  Surgeon: Irving Copas., MD;  [facility];  Service: Gastroenterology;;  . FINE NEEDLE ASPIRATION  12/09/2019   Procedure: FINE NEEDLE ASPIRATION (FNA) LINEAR;  Surgeon: Irving Copas., MD;  [facility];  Service: Gastroenterology;;  . HEMOSTASIS CLIP PLACEMENT  12/09/2019   Procedure: HEMOSTASIS CLIP PLACEMENT;  Surgeon: Irving Copas., MD;  [facility];  Service: Gastroenterology;;  . ORIF ANKLE FRACTURE Right 04/27/2015   Procedure: OPEN REDUCTION INTERNAL  FIXATION (ORIF) RIGHT BIMALLEOLAR ANKLE FRACTURE WITH SYNDESMOSIS FIXATION;  Surgeon: Leandrew Koyanagi, MD;  Location: [place];  Service: Orthopedics;  Laterality: Right;  . SUBMUCOSAL LIFTING INJECTION  12/09/2019   Procedure: SUBMUCOSAL LIFTING INJECTION;  Surgeon: Rush Landmark Telford Nab., MD;  [facility];  Service: Gastroenterology;;  . TEE WITHOUT CARDIOVERSION N/A 08/01/2016   Procedure: TRANSESOPHAGEAL ECHOCARDIOGRAM (TEE);  Surgeon: Lelon Perla, MD;  [facility];  Service: Cardiovascular;  Laterality: N/A;  . THYROIDECTOMY  11/24/2013   DR Dalbert Batman  . THYROIDECTOMY N/A 11/24/2013   Procedure: TOTAL THYROIDECTOMY;  Surgeon: Adin Hector, MD;  Location: [place];  Service: General;  Laterality: N/A;  . TRANSTHORACIC ECHOCARDIOGRAM  01/15/2013   EF 55% TO 65%. PROBABLE MILD HYPOKINESIS OF THE INFERIOR MYOCARDIUM. GRADE 1 DIASTOLIC DYSFUNCTION. TRIAL AR.LA IS MILDLY DILATED.    Allergies  Allergen Reactions  . Labetalol     headaches  . Codeine Anxiety    Current Outpatient Medications  Medication Sig Dispense Refill  . acetaminophen (TYLENOL) 650 MG CR tablet Take 1,300 mg by mouth every 8 (eight) hours as needed for pain.     Marland Kitchen allopurinol (ZYLOPRIM) 100 MG tablet Take 100 mg by mouth in the morning.    Marland Kitchen amLODipine (NORVASC) 5 MG tablet Take 1.5 tablets (7.5 mg total) by mouth every evening. 90 tablet 0  . apixaban (ELIQUIS) 5 MG TABS tablet Take 1 tablet (5 mg total) by mouth 2 (two) times daily. 180 tablet 0  . carvedilol (COREG) 25 MG tablet Take 1 tablet (25 mg total) by mouth 2 (two) times daily with a meal. 180 tablet 3  . Cod Liver Oil (COD LIVER PO) Take 1 tablet by mouth 2 (two) times a week.    Marland Kitchen FARXIGA 10 MG TABS tablet Take 10 mg by mouth in the morning.    . hydrALAZINE (APRESOLINE) 10 MG tablet Take 1 tablet (10 mg total) by mouth 2 (two) times daily. 180 tablet 3  . hydrochlorothiazide (MICROZIDE) 12.5 MG capsule Take 1 capsule (12.5 mg total)  by  mouth in the morning. 90 capsule 3  . isosorbide dinitrate (ISORDIL) 5 MG tablet Take 1 tablet (5 mg total) by mouth 2 (two) times daily. 180 tablet 3  . Magnesium 400 MG TABS Take 400 mg by mouth every evening.    . Multiple Vitamins-Minerals (MULTIVITAMIN WITH MINERALS) tablet Take 1 tablet by mouth daily in the afternoon.    Marland Kitchen olmesartan (BENICAR) 40 MG tablet Take 40 mg by mouth in the morning.    . Probiotic Product (PROBIOTIC PO) Take 1 capsule by mouth at bedtime.    . rosuvastatin (CRESTOR) 40 MG tablet Take 1 tablet (40 mg total) by mouth daily. (Patient taking differently: Take 40 mg by mouth in the morning.) 90 tablet 1  . SYNTHROID 88 MCG tablet Take 1 tablet (88 mcg total) by mouth daily. 90 tablet 0  . Vitamin D3 (VITAMIN D) 25 MCG tablet Take 1,000 Units by mouth daily in the afternoon.    . zinc gluconate 50 MG tablet Take 50 mg by mouth daily in the afternoon.     No current facility-administered medications for this visit.    Social History   Socioeconomic History  . Marital status: Widowed    Spouse name: Not on file  . Number of children: 3  . Years of education: Not on file  . Highest education level: Not on file  Occupational History  . Occupation: retired  Tobacco Use  . Smoking status: Former Smoker    Packs/day: 1.00    Years: 15.00    Pack years: 15.00    Types: Cigarettes    Quit date: 12/10/1990    Years since quitting: 30.4  . Smokeless tobacco: Never Used  Vaping Use  . Vaping Use: Never used  Substance and Sexual Activity  . Alcohol use: Yes    Comment: very rare  . Drug use: No  . Sexual activity: Not on file  Other Topics Concern  . Not on file  Social History Narrative   Right handed    Living  alone.   Social Determinants of Health   Financial Resource Strain: Not on file  Food Insecurity: Not on file  Transportation Needs: Not on file  Physical Activity: Not on file  Stress: Not on file  Social Connections: Not on file   Intimate Partner Violence: Not on file    Family History  Problem Relation Age of Onset  . Heart disease Mother   . Sudden death Mother   . Diabetes Father   . Stroke Father   . Bone cancer Sister   . Sudden death Brother   . Melanoma Brother   . Heart disease Brother   . Colon cancer Neg Hx   . Esophageal cancer Neg Hx   . Inflammatory bowel disease Neg Hx   . Liver disease Neg Hx   . Pancreatic cancer Neg Hx   . Rectal cancer Neg Hx   . Stomach cancer Neg Hx    Social history is notable that she is widowed and has 2 children plus one adopted child as well as 2 grandchildren. She does not routinely exercise. There is no tobacco or alcohol use.   ROS General: Negative; No fevers, chills, or night sweats;  HEENT: Negative; No changes in vision or hearing, sinus congestion, difficulty swallowing Pulmonary: Negative; No cough, wheezing, shortness of breath, hemoptysis Cardiovascular: See HPI GI: Negative; No nausea, vomiting, diarrhea, or abdominal pain GU: Negative; No dysuria, hematuria, or difficulty voiding Musculoskeletal: Negative; no myalgias,  joint pain, or weakness Hematologic/Oncology: Negative; no easy bruising, bleeding Endocrine: History of multinodular goiter status post thyroidectomy Neuro: Negative; no changes in balance, headaches Skin: Negative; No rashes or skin lesions Psychiatric: Negative; No behavioral problems, depression Sleep: Positive obstructive sleep apnea.  She admits to 100% compliance.  PE BP 118/80   Pulse 75   Ht '5\' 5"'  (1.651 m)   Wt 219 lb (99.3 kg)   SpO2 98%   BMI 36.44 kg/m    Repeat blood pressure by me 128/78  Wt Readings from Last 3 Encounters:  05/05/21 214 lb 9.6 oz (97.3 kg)  05/04/21 219 lb (99.3 kg)  04/26/21 216 lb (98 kg)   General: Alert, oriented, no distress.  Skin: normal turgor, no rashes, warm and dry HEENT: Normocephalic, atraumatic. Pupils equal round and reactive to light; sclera anicteric; extraocular  muscles intact;  Nose without nasal septal hypertrophy Mouth/Parynx benign; Mallinpatti scale 3 Neck: No JVD, no carotid bruits; normal carotid upstroke Lungs: clear to ausculatation and percussion; no wheezing or rales Chest wall: without tenderness to palpitation Heart: PMI not displaced, irregularly irregular with controlled ventricular rate in the 70s, s1 s2 normal, 1/6 systolic murmur, no diastolic murmur, no rubs, gallops, thrills, or heaves Abdomen: soft, nontender; no hepatosplenomehaly, BS+; abdominal aorta nontender and not dilated by palpation. Back: no CVA tenderness Pulses 2+ Musculoskeletal: full range of motion, normal strength, no joint deformities Extremities: no clubbing cyanosis or edema, Homan's sign negative  Neurologic: grossly nonfocal; Cranial nerves grossly wnl Psychologic: Normal mood and affect   ECG (independently read by me):  Atrial fibrillation at 75; QTc 366 msec     March 24, 2021 ECG (independently read by me): Atrial fibrillation at 75; NSST changes  March 2020 ECG (independently read by me): Atrial fibrillation at 101 bpm.  Specific T changes.  Q wave in lead III.  July 2019 ECG (independently read by me): Sinus bradycardia 57 bpm.  Low voltage.  Poor R wave progression.  April 2019 ECG (independently read by me): Sinus bradycardia 53 bpm.  Nonspecific T changes.  Normal intervals.  December 16, 2017 ECG (independently read by me): Sinus bradycardia 57 bpm.  Q wave in lead 3.  Poor R wave progression V1 through V3.  October  2017 ECG (independently read by me): Sinus bradycardia 52 bpm.  Poor anterior R-wave progression.  Normal intervals.  October 2014 ECG: Sinus rhythm at 69 beats per minute. Normal intervals. Nonspecific T changes.  LABS:  BMP Latest Ref Rng & Units 03/24/2021 02/16/2021 09/16/2020  Glucose 65 - 99 mg/dL 85 92 87  BUN 8 - 27 mg/dL '17 12 16  ' Creatinine 0.57 - 1.00 mg/dL 1.01(H) 1.06(H) 0.90  BUN/Creat Ratio 12 - 28 17 - 18   Sodium 134 - 144 mmol/L 143 143 141  Potassium 3.5 - 5.2 mmol/L 4.1 4.2 4.3  Chloride 96 - 106 mmol/L 102 109 100  CO2 20 - 29 mmol/L '25 29 27  ' Calcium 8.7 - 10.3 mg/dL 9.9 9.7 10.0   Hepatic Function Latest Ref Rng & Units 03/24/2021 09/16/2020 04/21/2020  Total Protein 6.0 - 8.5 g/dL 8.3 7.6 7.6  Albumin 3.7 - 4.7 g/dL 4.9(H) 4.6 4.5  AST 0 - 40 IU/L '22 17 16  ' ALT 0 - 32 IU/L '16 11 23  ' Alk Phosphatase 44 - 121 IU/L 103 106 95  Total Bilirubin 0.0 - 1.2 mg/dL 0.5 0.4 0.4  Bilirubin, Direct 0.00 - 0.40 mg/dL - - -   CBC Latest  Ref Rng & Units 03/24/2021 09/16/2020 06/30/2020  WBC 3.4 - 10.8 x10E3/uL 3.4 4.0 4.2  Hemoglobin 11.1 - 15.9 g/dL 16.6(H) 14.7 15.3  Hematocrit 34.0 - 46.6 % 51.8(H) 44.5 47.0(H)  Platelets 150 - 450 x10E3/uL 206 235 254   Lab Results  Component Value Date   MCV 91 03/24/2021   MCV 91 09/16/2020   MCV 92 06/30/2020   Lab Results  Component Value Date   TSH 4.570 (H) 03/24/2021    Lipid Panel     Component Value Date/Time   CHOL 150 03/24/2021 1113   TRIG 126 03/24/2021 1113   HDL 41 03/24/2021 1113   CHOLHDL 3.7 03/24/2021 1113   CHOLHDL 3.4 09/22/2019 0226   VLDL 19 09/22/2019 0226   LDLCALC 86 03/24/2021 1113   IMPRESSION:  1. Persistent atrial fibrillation ([place])   2. OSA on CPAP   3. Chronic anticoagulation   4. CAD in native artery   5. CAD S/P percutaneous coronary angioplasty   6. Dyslipidemia, goal LDL below 70   7. Stromal tumor determined by gastric biopsy      ASSESSMENT AND PLAN: Ms. Shawnta Schlegel is 75 year-old [place] American female who suffered a remote myocardial infarction and underwent PTCA by Dr. Sherald Barge approximately 30 years ago.  Cardiac catheterization in 2011 demonstrated a small focal wall motion abnormality in the inferior and posterior lateral wall concordant with his myocardial infarction. She has mild nonobstructive CAD. On her last  nuclear perfusion study  ejection fraction was 55% and she had findings consistent  with a small area of scar in the basal and mid inferolateral wall concordant with her left circumflex infarct.  She developed atrial fibrillation in August 2017 and underwent successful TEE cardioversion.  When I saw her in March 2020 her ECG showed that she was back in atrial fibrillation and I further titrated her beta-blocker to Toprol-XL 75 mg daily.  Since that evaluation, she has undergone 2 cardioversions initially in February 2021 and most recently on July 08, 2020 with restoration of sinus rhythm.  I have not seen her in over 2 years.  When I saw her in April 2022 she was on a regimen of amlodipine 7.5 mg daily, carvedilol 25 mg in the morning and 12.5 mg at night, olmesartan 40 mg daily in addition to  BiDil low-dose treatment with hydralazine 10 mg twice daily and isosorbide dinitrate 5 mg twice a day.  During that April 2022 evaluation I further titrated carvedilol to 25 mg twice a day.  I reviewed her echo Doppler study which continues to show normal systolic function with mild mitral regurgitation.  Her left atrium is only mildly dilated by report but left atrium diameter was 5.2 cm and the volume was increased more suggestive of greater LA dilatation.  She has felt improved with increased beta-blocker therapy and has more energy.  She continues to use CPAP with 100% compliance.  I had further discussion with her today regarding her atrial fibrillation.  With her previously failing 2 cardioversions and she being in atrial fibrillation of questionable duration, I have recommended an EP evaluation to discuss potential antiarrhythmic therapy versus ablation as a treatment modality.  I reviewed her laboratory.  TSH was 4.57.  Renal function was normal with a BUN of 17 and a creatinine of 1.01.  Hemoglobin and hematocrit were slightly increased at 16.6 and 51.8, respectively.  Lipid studies were improved with total cholesterol 150 triglycerides 126 HDL 41 although LDL cholesterol was 86.  Magnesium  2.3.   Troy Sine, MD, [facility]  05/10/2021 3:38 PM

## 2021-05-04 NOTE — Patient Instructions (Signed)
Medication Instructions:  Your Physician recommend you continue on your current medication as directed.    *If you need a refill on your cardiac medications before your next appointment, please call your pharmacy*   Lab Work: None ordered today   Testing/Procedures: None ordered today   Follow-Up: At [facility], you and your health needs are our priority.  As part of our continuing mission to provide you with exceptional heart care, we have created designated Provider Care Teams.  These Care Teams include your primary Cardiologist (physician) and Advanced Practice Providers (APPs -  Physician Assistants and Nurse Practitioners) who all work together to provide you with the care you need, when you need it.  We recommend signing up for the patient portal called "MyChart".  Sign up information is provided on this After Visit Summary.  MyChart is used to connect with patients for Virtual Visits (Telemedicine).  Patients are able to view lab/test results, encounter notes, upcoming appointments, etc.  Non-urgent messages can be sent to your provider as well.   To learn more about what you can do with MyChart, go to [email or web address].    Your next appointment:   4 month(s)  The format for your next appointment:   In Person  Provider:   Shelva Majestic, MD

## 2021-05-05 ENCOUNTER — Encounter: Payer: Self-pay | Admitting: Family Medicine

## 2021-05-05 ENCOUNTER — Ambulatory Visit (INDEPENDENT_AMBULATORY_CARE_PROVIDER_SITE_OTHER): Payer: PPO | Admitting: Family Medicine

## 2021-05-05 VITALS — BP 110/70 | HR 68 | Ht 64.0 in | Wt 214.6 lb

## 2021-05-05 DIAGNOSIS — R7303 Prediabetes: Secondary | ICD-10-CM

## 2021-05-05 DIAGNOSIS — G4733 Obstructive sleep apnea (adult) (pediatric): Secondary | ICD-10-CM | POA: Diagnosis not present

## 2021-05-05 DIAGNOSIS — E785 Hyperlipidemia, unspecified: Secondary | ICD-10-CM

## 2021-05-05 DIAGNOSIS — Z7901 Long term (current) use of anticoagulants: Secondary | ICD-10-CM | POA: Diagnosis not present

## 2021-05-05 DIAGNOSIS — Z7189 Other specified counseling: Secondary | ICD-10-CM

## 2021-05-05 DIAGNOSIS — E890 Postprocedural hypothyroidism: Secondary | ICD-10-CM

## 2021-05-05 DIAGNOSIS — I10 Essential (primary) hypertension: Secondary | ICD-10-CM

## 2021-05-05 DIAGNOSIS — E559 Vitamin D deficiency, unspecified: Secondary | ICD-10-CM | POA: Diagnosis not present

## 2021-05-05 DIAGNOSIS — Z7185 Encounter for immunization safety counseling: Secondary | ICD-10-CM

## 2021-05-05 DIAGNOSIS — I4819 Other persistent atrial fibrillation: Secondary | ICD-10-CM

## 2021-05-05 DIAGNOSIS — Z9989 Dependence on other enabling machines and devices: Secondary | ICD-10-CM

## 2021-05-05 DIAGNOSIS — Z0000 Encounter for general adult medical examination without abnormal findings: Secondary | ICD-10-CM | POA: Diagnosis not present

## 2021-05-05 NOTE — Patient Instructions (Addendum)
  Ms. Lindsley , Thank you for taking time to come for your Medicare Wellness Visit. I appreciate your ongoing commitment to your health goals. Please review the following plan we discussed and let me know if I can assist you in the future.   These are the goals we discussed:  Get your Tdap (tetanus, diptheria and pertussis) vaccine at your pharmacy.   Limit white foods such as potatoes, pasta, bread, rice and sugar.  Try to get at least 150 minutes of physical activity each week.   Get your colonoscopy as recommended by your GI.     This is a list of the screening recommended for you and due dates:  Health Maintenance  Topic Date Due  . Tetanus Vaccine  Never done  . Colon Cancer Screening  Never done  . COVID-19 Vaccine (4 - Booster for Pfizer series) 12/17/2020  . Flu Shot  07/10/2021  . Mammogram  04/13/2023  . DEXA scan (bone density measurement)  Completed  . Hepatitis C Screening: USPSTF Recommendation to screen - Ages 44-79 yo.  Completed  . Pneumonia vaccines  Completed  . Zoster (Shingles) Vaccine  Completed  . HPV Vaccine  Aged Out

## 2021-05-09 ENCOUNTER — Encounter: Payer: Self-pay | Admitting: Internal Medicine

## 2021-05-10 ENCOUNTER — Encounter: Payer: Self-pay | Admitting: Cardiovascular Disease

## 2021-05-24 NOTE — Progress Notes (Signed)
Electrophysiology Office Note   Date:  05/25/2021   ID:  Claudia Cantrell, DOB 06-25-1946, MRN [id number]  PCP:  Girtha Rm, NP-C  Cardiologist:  Claiborne Billings Primary Electrophysiologist:  Holley Wirt Meredith Leeds, MD    Chief Complaint: atrial fibrillation   History of Present Illness: Claudia Cantrell is a 75 y.o. female who is being seen today for the evaluation of atrial fibrillation at the request of Troy Sine, MD. Presenting today for electrophysiology evaluation.  She has a history significant for coronary artery disease, CKD, hypertension, hyperlipidemia, nonischemic cardiomyopathy, and atrial fibrillation.  She has a history of myocardial infarction in [id number].  She also has obstructive sleep apnea on CPAP.  She developed atrial fibrillation in 2017.  She underwent TEE and cardioversion at the time.  She was started on Eliquis.  Today, she denies symptoms of palpitations, chest pain, orthopnea, PND, lower extremity edema, claudication, dizziness, presyncope, syncope, bleeding, or neurologic sequela. The patient is tolerating medications without difficulties.  Her symptoms from atrial fibrillation are fatigue and shortness of breath.  She states that she has to sit down quite often when she tries to exert herself.  She usually likes working in her yard, but has been unable to do this due to her level of fatigue and shortness of breath.  She did feel better for a short time after cardioversion, but unfortunately went back into atrial fibrillation.   Past Medical History:  Diagnosis Date   Arthritis    rheumatoid   CAD (coronary artery disease)    a. 1992 s/p MI and PTCA of unknown vessel;  b. 09/2010 Cath: LM nl, LAD 20p, LCX 58m, RCA 74m, RPL 20.   Cervical cancer ([place]) 1977   Chronic kidney disease    Family history of anesthesia complication    daughter has difficulty waking    GERD (gastroesophageal reflux disease)    occ   Gout 10/08/2016   Hyperlipidemia    Hypertensive  heart disease    Hypothyroidism    Nonischemic cardiomyopathy ([place])    a. 07/2016 Echo: EF 40-45%, mild LVH, inferior akinesis, moderately dilated left atrium, trivial AI and MR.   PAF (paroxysmal atrial fibrillation) ([place])    a. 07/2016 Admitted w/ AF RVR-->CHA2DS2VASc = 5-->Eliquis;  b. 07/2016 successful TEE/DCCV.   Sleep apnea    a. Using CPAP.   Past Surgical History:  Procedure Laterality Date   [place]   BIOPSY  09/02/2019   Procedure: BIOPSY;  Surgeon: Rush Landmark Telford Nab., MD;  Location: [facility];  Service: Gastroenterology;;   BIOPSY  12/09/2019   Procedure: BIOPSY;  Surgeon: Irving Copas., MD;  Location: [facility];  Service: Gastroenterology;;   BIOPSY  03/08/2021   Procedure: BIOPSY;  Surgeon: Irving Copas., MD;  Location: [facility] ENDOSCOPY;  Service: Gastroenterology;;   [place]   PTCA BY DR Granger N/A 08/01/2016   Procedure: CARDIOVERSION;  Surgeon: Lelon Perla, MD;  Location: [facility] ENDOSCOPY;  Service: Cardiovascular;  Laterality: N/A;   CARDIOVERSION N/A 01/11/2020   Procedure: CARDIOVERSION;  Surgeon: Josue Hector, MD;  Location: [facility] ENDOSCOPY;  Service: Cardiovascular;  Laterality: N/A;   CARDIOVERSION N/A 07/08/2020   Procedure: CARDIOVERSION;  Surgeon: [name], MD;  Location: [facility] ENDOSCOPY;  Service: Cardiovascular;  Laterality: N/A;   ENDOSCOPIC MUCOSAL RESECTION N/A 12/09/2019   Procedure: ENDOSCOPIC MUCOSAL RESECTION;  Surgeon: Irving Copas.,  MD;  Location: [facility] ENDOSCOPY;  Service: Gastroenterology;  Laterality: N/A;   ESOPHAGOGASTRODUODENOSCOPY N/A 03/08/2021   Procedure: ESOPHAGOGASTRODUODENOSCOPY (EGD);  Surgeon: Irving Copas., MD;  Location: [facility];  Service: Gastroenterology;  Laterality: N/A;   ESOPHAGOGASTRODUODENOSCOPY (EGD) WITH PROPOFOL N/A 09/02/2019   Procedure: ESOPHAGOGASTRODUODENOSCOPY (EGD)  WITH PROPOFOL;  Surgeon: Rush Landmark Telford Nab., MD;  Location: [facility] ENDOSCOPY;  Service: Gastroenterology;  Laterality: N/A;   ESOPHAGOGASTRODUODENOSCOPY (EGD) WITH PROPOFOL N/A 12/09/2019   Procedure: ESOPHAGOGASTRODUODENOSCOPY (EGD) WITH PROPOFOL;  Surgeon: Rush Landmark Telford Nab., MD;  Location: [facility] ENDOS[facility];  Service: Gastroenterology;  Laterality: N/A;   EUS N/A 09/02/2019   Procedure: UPPER ENDOSCOPIC ULTRASOUND (EUS) RADIAL;  Surgeon: Irving Copas., MD;  Location: [facility] ENDOS[facility];  Service: Gastroenterology;  Laterality: N/A;  EUS radial/linear   EUS N/A 12/09/2019   Procedure: UPPER ENDOSCOPIC ULTRASOUND (EUS) RADIAL;  Surgeon: Irving Copas., MD;  Location: [facility] ENDOS[facility];  Service: Gastroenterology;  Laterality: N/A;   EUS  12/09/2019   Procedure: UPPER ENDOSCOPIC ULTRASOUND (EUS) LINEAR;  Surgeon: Irving Copas., MD;  Location: [facility];  Service: Gastroenterology;;   EUS N/A 03/08/2021   Procedure: UPPER ENDOSCOPIC ULTRASOUND (EUS) RADIAL;  Surgeon: Irving Copas., MD;  Location: [facility];  Service: Gastroenterology;  Laterality: N/A;   FINE NEEDLE ASPIRATION  09/02/2019   Procedure: FINE NEEDLE ASPIRATION (FNA) LINEAR;  Surgeon: Irving Copas., MD;  Location: [facility];  Service: Gastroenterology;;   FINE NEEDLE ASPIRATION  12/09/2019   Procedure: FINE NEEDLE ASPIRATION (FNA) LINEAR;  Surgeon: Irving Copas., MD;  Location: [facility];  Service: Gastroenterology;;   HEMOSTASIS CLIP PLACEMENT  12/09/2019   Procedure: HEMOSTASIS CLIP PLACEMENT;  Surgeon: Irving Copas., MD;  Location: [facility];  Service: Gastroenterology;;   ORIF ANKLE FRACTURE Right 04/27/2015   Procedure: OPEN REDUCTION INTERNAL FIXATION (ORIF) RIGHT BIMALLEOLAR ANKLE FRACTURE WITH SYNDESMOSIS FIXATION;  Surgeon: Leandrew Koyanagi, MD;  Location: [place];  Service: Orthopedics;  Laterality: Right;   SUBMUCOSAL LIFTING INJECTION  12/09/2019   Procedure:  SUBMUCOSAL LIFTING INJECTION;  Surgeon: Irving Copas., MD;  Location: [facility] ENDOSCOPY;  Service: Gastroenterology;;   TEE WITHOUT CARDIOVERSION N/A 08/01/2016   Procedure: TRANSESOPHAGEAL ECHOCARDIOGRAM (TEE);  Surgeon: Lelon Perla, MD;  Location: [facility] ENDOSCOPY;  Service: Cardiovascular;  Laterality: N/A;   THYROIDECTOMY  11/24/2013   DR Dalbert Batman   THYROIDECTOMY N/A 11/24/2013   Procedure: TOTAL THYROIDECTOMY;  Surgeon: Adin Hector, MD;  Location: [place];  Service: General;  Laterality: N/A;   TRANSTHORACIC ECHOCARDIOGRAM  01/15/2013   EF 55% TO 65%. PROBABLE MILD HYPOKINESIS OF THE INFERIOR MYOCARDIUM. GRADE 1 DIASTOLIC DYSFUNCTION. TRIAL AR.LA IS MILDLY DILATED.     Current Outpatient Medications  Medication Sig Dispense Refill   acetaminophen (TYLENOL) 650 MG CR tablet Take 1,300 mg by mouth every 8 (eight) hours as needed for pain.      allopurinol (ZYLOPRIM) 100 MG tablet Take 100 mg by mouth in the morning.     amLODipine (NORVASC) 5 MG tablet Take 1.5 tablets (7.5 mg total) by mouth every evening. 90 tablet 0   apixaban (ELIQUIS) 5 MG TABS tablet Take 1 tablet (5 mg total) by mouth 2 (two) times daily. 180 tablet 0   carvedilol (COREG) 25 MG tablet Take 1 tablet (25 mg total) by mouth 2 (two) times daily with a meal. 180 tablet 3   Cod Liver Oil (COD LIVER PO) Take 1 tablet by mouth 2 (two) times a week.     FARXIGA 10  MG TABS tablet Take 10 mg by mouth in the morning.     hydrALAZINE (APRESOLINE) 10 MG tablet Take 1 tablet (10 mg total) by mouth 2 (two) times daily. 180 tablet 3   hydrochlorothiazide (MICROZIDE) 12.5 MG capsule Take 1 capsule (12.5 mg total) by mouth in the morning. 90 capsule 3   isosorbide dinitrate (ISORDIL) 5 MG tablet Take 1 tablet (5 mg total) by mouth 2 (two) times daily. 180 tablet 3   Magnesium 400 MG TABS Take 400 mg by mouth every evening.     Multiple Vitamins-Minerals (MULTIVITAMIN WITH MINERALS) tablet Take 1 tablet by mouth daily in the  afternoon.     olmesartan (BENICAR) 40 MG tablet Take 40 mg by mouth in the morning.     Probiotic Product (PROBIOTIC PO) Take 1 capsule by mouth at bedtime.     rosuvastatin (CRESTOR) 40 MG tablet Take 1 tablet (40 mg total) by mouth daily. (Patient taking differently: Take 40 mg by mouth in the morning.) 90 tablet 1   SYNTHROID 88 MCG tablet Take 1 tablet (88 mcg total) by mouth daily. 90 tablet 0   Vitamin D3 (VITAMIN D) 25 MCG tablet Take 1,000 Units by mouth daily in the afternoon.     zinc gluconate 50 MG tablet Take 50 mg by mouth daily in the afternoon.     No current facility-administered medications for this visit.    Allergies:   Labetalol and Codeine   Social History:  The patient  reports that she quit smoking about 30 years ago. Her smoking use included cigarettes. She has a 15.00 pack-year smoking history. She has never used smokeless tobacco. She reports current alcohol use. She reports that she does not use drugs.   Family History:  The patient's family history includes Bone cancer in her sister; Diabetes in her father; Heart disease in her brother and mother; Melanoma in her brother; Stroke in her father; Sudden death in her brother and mother.    ROS:  Please see the history of present illness.   Otherwise, review of systems is positive for none.   All other systems are reviewed and negative.    PHYSICAL EXAM: VS:  BP 112/80   Pulse 64   Ht 5\' 5"  (1.651 m)   Wt 216 lb (98 kg)   SpO2 97%   BMI 35.94 kg/m  , BMI Body mass index is 35.94 kg/m. GEN: Well nourished, well developed, in no acute distress  HEENT: normal  Neck: no JVD, carotid bruits, or masses Cardiac: Irregular; no murmurs, rubs, or gallops,no edema  Respiratory:  clear to auscultation bilaterally, normal work of breathing GI: soft, nontender, nondistended, + BS MS: no deformity or atrophy  Skin: warm and dry Neuro:  Strength and sensation are intact Psych: euthymic mood, full affect  EKG:  EKG is  not ordered today. Personal review of the ekg ordered 05/04/21 shows AF, rate 75  Recent Labs: 03/24/2021: ALT 16; BUN 17; Creatinine, Ser 1.01; Hemoglobin 16.6; Magnesium 2.3; Platelets 206; Potassium 4.1; Sodium 143; TSH 4.570    Lipid Panel     Component Value Date/Time   CHOL 150 03/24/2021 1113   TRIG 126 03/24/2021 1113   HDL 41 03/24/2021 1113   CHOLHDL 3.7 03/24/2021 1113   CHOLHDL 3.4 09/22/2019 0226   VLDL 19 09/22/2019 0226   LDLCALC 86 03/24/2021 1113     Wt Readings from Last 3 Encounters:  05/25/21 216 lb (98 kg)  05/05/21 214 lb 9.6  oz (97.3 kg)  05/04/21 219 lb (99.3 kg)      Other studies Reviewed: Additional studies/ records that were reviewed today include: TTE 04/24/21  Review of the above records today demonstrates:   1. Left ventricular ejection fraction, by estimation, is 55 to 60%. The  left ventricle has normal function. The left ventricle has no regional  wall motion abnormalities. There is mild left ventricular hypertrophy.  Left ventricular diastolic parameters  are indeterminate.   2. Right ventricular systolic function is normal. The right ventricular  size is normal.   3. Left atrial size was mildly dilated.   4. The mitral valve is normal in structure. Mild mitral valve  regurgitation. No evidence of mitral stenosis.   5. The aortic valve is normal in structure. Aortic valve regurgitation is  mild. No aortic stenosis is present.   6. The inferior vena cava is normal in size with greater than 50%  respiratory variability, suggesting right atrial pressure of 3 mmHg.    ASSESSMENT AND PLAN:  1.  Persistent atrial fibrillation: Currently on Eliquis.  CHA2DS2-VASc of at least 5.  She has continued to have frequent episodes of atrial fibrillation.  At this point, she would prefer a rhythm control strategy.  We spoke about antiarrhythmics as well as ablation.  At this point, she would prefer ablation.  Risk, benefits, and alternatives to EP  study and radiofrequency ablation for afib were also discussed in detail today. These risks include but are not limited to stroke, bleeding, vascular damage, tamponade, perforation, damage to the esophagus, lungs, and other structures, pulmonary vein stenosis, worsening renal function, and death. The patient understands these risk and wishes to proceed.  We Lavere Stork therefore proceed with catheter ablation at the next available time.  Carto, ICE, anesthesia are requested for the procedure.  Kristoph Sattler also obtain CT PV protocol prior to the procedure to exclude LAA thrombus and further evaluate atrial anatomy.   2.  Coronary artery disease: No current chest pain.  Plan per primary cardiology.  3.  Obstructive sleep apnea: CPAP compliance encouraged  Case discussed with primary cardiology  Current medicines are reviewed at length with the patient today.   The patient does not have concerns regarding her medicines.  The following changes were made today:  none  Labs/ tests ordered today include:  Orders Placed This Encounter  Procedures   CT CARDIAC MORPH/PULM VEIN W/CM&W/O CA SCORE   Basic metabolic panel   CBC      Disposition:   FU with Ximenna Fonseca 3 months  Signed, Izeyah Deike Meredith Leeds, MD  05/25/2021 9:48 AM     [facility] [address] [place] [address] [phone] (office) [phone] (fax)

## 2021-05-25 ENCOUNTER — Ambulatory Visit: Payer: PPO | Admitting: Cardiology

## 2021-05-25 ENCOUNTER — Encounter: Payer: Self-pay | Admitting: Cardiology

## 2021-05-25 ENCOUNTER — Other Ambulatory Visit: Payer: Self-pay

## 2021-05-25 VITALS — BP 112/80 | HR 64 | Ht 65.0 in | Wt 216.0 lb

## 2021-05-25 DIAGNOSIS — I4819 Other persistent atrial fibrillation: Secondary | ICD-10-CM | POA: Diagnosis not present

## 2021-05-25 DIAGNOSIS — Z01818 Encounter for other preprocedural examination: Secondary | ICD-10-CM

## 2021-05-25 DIAGNOSIS — Z01812 Encounter for preprocedural laboratory examination: Secondary | ICD-10-CM

## 2021-05-25 NOTE — Patient Instructions (Addendum)
Medication Instructions:  Your physician recommends that you continue on your current medications as directed. Please refer to the Current Medication list given to you today.  *If you need a refill on your cardiac medications before your next appointment, please call your pharmacy*   Lab Work: None today If you have labs (blood work) drawn today and your tests are completely normal, you will receive your results only by: [place] (if you have MyChart) OR A paper copy in the mail If you have any lab test that is abnormal or we need to change your treatment, we will call you to review the results.   Testing/Procedures: Your physician has requested that you have cardiac CT within 7 days PRIOR to your ablation. Cardiac computed tomography (CT) is a painless test that uses an x-ray machine to take clear, detailed pictures of your heart.  Please follow instruction below located under "other instructions". You will get a call from our office to schedule the date for this test.  Your physician has recommended that you have an ablation. Catheter ablation is a medical procedure used to treat some cardiac arrhythmias (irregular heartbeats). During catheter ablation, a long, thin, flexible tube is put into a blood vessel in your groin (upper thigh), or neck. This tube is called an ablation catheter. It is then guided to your heart through the blood vessel. Radio frequency waves destroy small areas of heart tissue where abnormal heartbeats may cause an arrhythmia to start. Please follow instruction below located under "other instructions".   Follow-Up: At [facility], you and your health needs are our priority.  As part of our continuing mission to provide you with exceptional heart care, we have created designated Provider Care Teams.  These Care Teams include your primary Cardiologist (physician) and Advanced Practice Providers (APPs -  Physician Assistants and Nurse Practitioners) who all work  together to provide you with the care you need, when you need it.  We recommend signing up for the patient portal called "MyChart".  Sign up information is provided on this After Visit Summary.  MyChart is used to connect with patients for Virtual Visits (Telemedicine).  Patients are able to view lab/test results, encounter notes, upcoming appointments, etc.  Non-urgent messages can be sent to your provider as well.   To learn more about what you can do with MyChart, go to [email or web address].    Your next appointment:   1 month(s) after your ablation  The format for your next appointment:   In Person  Provider:   AFib clinic   Thank you for choosing [facility]!!   Trinidad Curet, RN [phone]    Other Instructions   CT INSTRUCTIONS Your cardiac CT will be scheduled at:  [facility] [address] [place],  [address] [phone]  Please arrive at the [facility] main entrance (entrance A) of [facility] 30 minutes prior to test start time. Proceed to the [facility] Radiology Department (first floor) to check-in and test prep.  Please follow these instructions carefully (unless otherwise directed):  On the Night Before the Test: Be sure to Drink plenty of water. Do not consume any caffeinated/decaffeinated beverages or chocolate 12 hours prior to your test. Do not take any antihistamines 12 hours prior to your test.  On the Day of the Test: Drink plenty of water until 1 hour prior to the test. Do not eat any food 4 hours prior to the test. You may take your regular medications  prior to the test.  HOLD Furosemide/Hydrochlorothiazide morning of the test. FEMALES- please wear underwire-free bra if available      After the Test: Drink plenty of water. After receiving IV contrast, you may experience a mild flushed feeling. This is normal. On occasion, you may experience a mild rash up to 24 hours after the test. This is not  dangerous. If this occurs, you can take Benadryl 25 mg and increase your fluid intake. If you experience trouble breathing, this can be serious. If it is severe call 911 IMMEDIATELY. If it is mild, please call our office. If you take any of these medications: Glipizide/Metformin, Avandament, Glucavance, please do not take 48 hours after completing test unless otherwise instructed.   Once we have confirmed authorization from your insurance company, we will call you to set up a date and time for your test. Based on how quickly your insurance processes prior authorizations requests, please allow up to 4 weeks to be contacted for scheduling your Cardiac CT appointment. Be advised that routine Cardiac CT appointments could be scheduled as many as 8 weeks after your provider has ordered it.  For non-scheduling related questions, please contact the cardiac imaging nurse navigator should you have any questions/concerns: Marchia Bond, Cardiac Imaging Nurse Navigator Gordy Clement, Cardiac Imaging Nurse Navigator [facility] Direct Office Dial: [phone]   For scheduling needs, including cancellations and rescheduling, please call [place], [phone].     Electrophysiology/Ablation Procedure Instructions   You are scheduled for a(n)  ablation on 07/28/2021 with Dr. Allegra Lai.   1.   Pre procedure testing-             A.  LAB WORK --- On 07/14/2021 for your pre procedure blood work.  You do NOT need to be fasting.  You can stop by the [employer] office anytime between 7:30 am - 4:30 pm.   On the day of your procedure 07/28/2021 you will go to [facility] hospital ([address]) at 5:30 am.  Dennis Bast will go to the main entrance A [employer]) and enter where the [employer] are.  Your driver will drop you off and you will head down the hallway to ADMITTING.  You may have one support person come in to the hospital with you.  They will be asked to wait in the  waiting room. It is OK to have someone drop you off and come back when you are ready to be discharged.   3.   Do not eat or drink after midnight prior to your procedure.   4.   On the morning of your procedure do NOT take any medication. Do not miss any doses of your blood thinner prior to the morning of your procedure or your procedure will need to be rescheduled.   5.  Plan for an overnight stay but you may be discharged after your procedure, if you use your phone frequently bring your phone charger. If you are discharged after your procedure you will need someone to drive you home and be with you for 24 hours after your procedure.   6. You will follow up with the AFIB clinic 4 weeks after your procedure.  You will follow up with Dr. Curt Bears  3 months after your procedure.  These appointments will be made for you.   * If you have ANY questions please call the office [phone] and ask for Itha Kroeker RN or send me a MyChart message   *  Occasionally, EP Studies and ablations can become lengthy.  Please make your family aware of this before your procedure starts.  Average time ranges from 2-8 hours for EP studies/ablations.  Your physician will call your family after the procedure with the results.                                    Cardiac Ablation Cardiac ablation is a procedure to destroy (ablate) some heart tissue that is sending bad signals. These bad signals causeproblems in heart rhythm. The heart has many areas that make these signals. If there are problems in these areas, they can make the heart beat in a way that is not normal.Destroying some tissues can help make the heart rhythm normal. Tell your doctor about: Any allergies you have. All medicines you are taking. These include vitamins, herbs, eye drops, creams, and over-the-counter medicines. Any problems you or family members have had with medicines that make you fall asleep (anesthetics). Any blood disorders you have. Any  surgeries you have had. Any medical conditions you have, such as kidney failure. Whether you are pregnant or may be pregnant. What are the risks? This is a safe procedure. But problems may occur, including: Infection. Bruising and bleeding. Bleeding into the chest. Stroke or blood clots. Damage to nearby areas of your body. Allergies to medicines or dyes. The need for a pacemaker if the normal system is damaged. Failure of the procedure to treat the problem. What happens before the procedure? Medicines Ask your doctor about: Changing or stopping your normal medicines. This is important. Taking aspirin and ibuprofen. Do not take these medicines unless your doctor tells you to take them. Taking other medicines, vitamins, herbs, and supplements. General instructions Follow instructions from your doctor about what you cannot eat or drink. Plan to have someone take you home from the hospital or clinic. If you will be going home right after the procedure, plan to have someone with you for 24 hours. Ask your doctor what steps will be taken to prevent infection. What happens during the procedure?  An IV tube will be put into one of your veins. You will be given a medicine to help you relax. The skin on your neck or groin will be numbed. A cut (incision) will be made in your neck or groin. A needle will be put through your cut and into a large vein. A tube (catheter) will be put into the needle. The tube will be moved to your heart. Dye may be put through the tube. This helps your doctor see your heart. Small devices (electrodes) on the tube will send out signals. A type of energy will be used to destroy some heart tissue. The tube will be taken out. Pressure will be held on your cut. This helps stop bleeding. A bandage will be put over your cut. The exact procedure may vary among doctors and hospitals. What happens after the procedure? You will be watched until you leave the hospital  or clinic. This includes checking your heart rate, breathing rate, oxygen, and blood pressure. Your cut will be watched for bleeding. You will need to lie still for a few hours. Do not drive for 24 hours or as long as your doctor tells you. Summary Cardiac ablation is a procedure to destroy some heart tissue. This is done to treat heart rhythm problems. Tell your doctor about any medical conditions you may  have. Tell him or her about all medicines you are taking to treat them. This is a safe procedure. But problems may occur. These include infection, bruising, bleeding, and damage to nearby areas of your body. Follow what your doctor tells you about food and drink. You may also be told to change or stop some of your medicines. After the procedure, do not drive for 24 hours or as long as your doctor tells you. This information is not intended to replace advice given to you by your health care provider. Make sure you discuss any questions you have with your healthcare provider. Document Revised: 10/29/2019 Document Reviewed: 10/29/2019 Elsevier Patient Education  2022 [employer].

## 2021-05-27 ENCOUNTER — Other Ambulatory Visit: Payer: Self-pay | Admitting: Family Medicine

## 2021-05-27 ENCOUNTER — Other Ambulatory Visit: Payer: Self-pay | Admitting: Cardiovascular Disease

## 2021-05-29 ENCOUNTER — Telehealth: Payer: Self-pay

## 2021-05-29 ENCOUNTER — Other Ambulatory Visit: Payer: Self-pay

## 2021-05-29 MED ORDER — ELIQUIS 5 MG PO TABS: 5.0000 mg | ORAL_TABLET | Freq: Two times a day (BID) | ORAL | 1 refills | Status: DC

## 2021-05-29 NOTE — Telephone Encounter (Signed)
52f, 98kg, scr 1.0 05/04/21, lovw/camnitz 05/25/21

## 2021-05-30 MED ORDER — CARVEDILOL 25 MG PO TABS: 25.0000 mg | ORAL_TABLET | Freq: Two times a day (BID) | ORAL | 3 refills | Status: DC

## 2021-05-30 NOTE — Telephone Encounter (Signed)
Received refill request for carvedilol 25 mg in the morning and 12.5 mg every evening. Per chart review, MD wanted to increase carvedilol to 25 mg BID. Nurse spoke with pt who confirmed she is currently taking 25 mg BID. New Rx sent with correct dose.

## 2021-06-07 ENCOUNTER — Encounter: Payer: Self-pay | Admitting: Family Medicine

## 2021-06-20 ENCOUNTER — Telehealth: Payer: Self-pay | Admitting: Cardiovascular Disease

## 2021-06-20 MED ORDER — ISOSORBIDE DINITRATE 5 MG PO TABS: 5.0000 mg | ORAL_TABLET | Freq: Two times a day (BID) | ORAL | 3 refills | Status: DC

## 2021-06-20 NOTE — Telephone Encounter (Signed)
*  STAT* If patient is at the pharmacy, call can be transferred to refill team.   1. Which medications need to be refilled? (please list name of each medication and dose if known)   isosorbide dinitrate (ISORDIL) 5 MG tablet  2. Which pharmacy/location (including street and city if local pharmacy) is medication to be sent to? [place], [place]  3. Do they need a 30 day or 90 day supply? 90 day supply

## 2021-07-14 ENCOUNTER — Other Ambulatory Visit: Payer: Self-pay

## 2021-07-14 ENCOUNTER — Other Ambulatory Visit: Payer: PPO | Admitting: *Deleted

## 2021-07-14 DIAGNOSIS — Z01812 Encounter for preprocedural laboratory examination: Secondary | ICD-10-CM | POA: Diagnosis not present

## 2021-07-14 DIAGNOSIS — I4819 Other persistent atrial fibrillation: Secondary | ICD-10-CM | POA: Diagnosis not present

## 2021-07-14 LAB — CBC
Hematocrit: 50.1 % — ABNORMAL HIGH (ref 34.0–46.6)
Hemoglobin: 15.9 g/dL (ref 11.1–15.9)
MCH: 29.2 pg (ref 26.6–33.0)
MCHC: 31.7 g/dL (ref 31.5–35.7)
MCV: 92 fL (ref 79–97)
Platelets: 240 10*3/uL (ref 150–450)
RBC: 5.44 x10E6/uL — ABNORMAL HIGH (ref 3.77–5.28)
RDW: 14.9 % (ref 11.7–15.4)
WBC: 3.2 10*3/uL — ABNORMAL LOW (ref 3.4–10.8)

## 2021-07-14 LAB — BASIC METABOLIC PANEL
BUN/Creatinine Ratio: 13 (ref 12–28)
BUN: 12 mg/dL (ref 8–27)
CO2: 24 mmol/L (ref 20–29)
Calcium: 9.6 mg/dL (ref 8.7–10.3)
Chloride: 104 mmol/L (ref 96–106)
Creatinine, Ser: 0.93 mg/dL (ref 0.57–1.00)
Glucose: 87 mg/dL (ref 65–99)
Potassium: 4.1 mmol/L (ref 3.5–5.2)
Sodium: 143 mmol/L (ref 134–144)
eGFR: 65 mL/min/{1.73_m2} (ref >59)

## 2021-07-19 ENCOUNTER — Telehealth (HOSPITAL_COMMUNITY): Payer: Self-pay | Admitting: Emergency Medicine

## 2021-07-19 ENCOUNTER — Ambulatory Visit: Payer: PPO | Admitting: Internal Medicine

## 2021-07-19 NOTE — Telephone Encounter (Signed)
Reaching out to patient to offer assistance regarding upcoming cardiac imaging study; pt verbalizes understanding of appt date/time, parking situation and where to check in, pre-test NPO status and medications ordered, and verified current allergies; name and call back number provided for further questions should they arise Claudia Bond RN Navigator Cardiac Imaging [facility] [phone] office [phone] cell  Pt reports difficult veins Denies claustro Normal meds, except HCTZ

## 2021-07-21 ENCOUNTER — Encounter (HOSPITAL_COMMUNITY): Payer: Self-pay

## 2021-07-21 ENCOUNTER — Other Ambulatory Visit: Payer: Self-pay

## 2021-07-21 ENCOUNTER — Ambulatory Visit (HOSPITAL_COMMUNITY)
Admission: RE | Admit: 2021-07-21 | Discharge: 2021-07-21 | Disposition: A | Discharge status: Home / Self Care | Payer: PPO | Source: Ambulatory Visit | Attending: Cardiology | Admitting: Cardiology

## 2021-07-21 DIAGNOSIS — I4819 Other persistent atrial fibrillation: Secondary | ICD-10-CM | POA: Diagnosis not present

## 2021-07-21 MED ORDER — IOHEXOL 350 MG/ML SOLN
95.0000 mL | Freq: Once | INTRAVENOUS | Status: AC | PRN
  Administered 2021-07-21: 95 mL via INTRAVENOUS

## 2021-07-24 DIAGNOSIS — N182 Chronic kidney disease, stage 2 (mild): Secondary | ICD-10-CM | POA: Diagnosis not present

## 2021-07-24 NOTE — Telephone Encounter (Signed)
Left message to call back  

## 2021-07-24 NOTE — Telephone Encounter (Addendum)
Patient returning [employer]. Please call her cell phone [phone]

## 2021-07-26 NOTE — Telephone Encounter (Signed)
Spoke to pt. Reviewed procedure instructions. Patient verbalized understanding and agreeable to plan.

## 2021-07-27 NOTE — Anesthesia Preprocedure Evaluation (Addendum)
Anesthesia Evaluation  Patient identified by MRN, date of birth, ID band Patient awake    Reviewed: Allergy & Precautions, NPO status , Patient's Chart, lab work & pertinent test results, reviewed documented beta blocker date and time   History of Anesthesia Complications Negative for: history of anesthetic complications  Airway Mallampati: II  TM Distance: >3 FB Neck ROM: Full    Dental  (+) Dental Advisory Given, Partial Upper, Partial Lower   Pulmonary sleep apnea and Continuous Positive Airway Pressure Ventilation , former smoker,    Pulmonary exam normal        Cardiovascular hypertension, Pt. on medications and Pt. on home beta blockers + CAD  Normal cardiovascular exam+ dysrhythmias Atrial Fibrillation    '22 TTE - EF 55 to 60%. Mild left ventricular hypertrophy.  Left atrial size was mildly dilated. Mild MR and AI.    Neuro/Psych negative neurological ROS  negative psych ROS   GI/Hepatic Neg liver ROS, GERD  Medicated and Controlled,  Endo/Other  Hypothyroidism   Renal/GU CRFRenal disease     Musculoskeletal  (+) Arthritis ,   Abdominal   Peds  Hematology negative hematology ROS (+)  On eliquis   Anesthesia Other Findings   Reproductive/Obstetrics                            Anesthesia Physical Anesthesia Plan  ASA: 3  Anesthesia Plan: General   Post-op Pain Management:    Induction: Intravenous  PONV Risk Score and Plan: 3 and Treatment may vary due to age or medical condition, Ondansetron and Dexamethasone  Airway Management Planned: Oral ETT  Additional Equipment: None  Intra-op Plan:   Post-operative Plan: Extubation in OR  Informed Consent: I have reviewed the patients History and Physical, chart, labs and discussed the procedure including the risks, benefits and alternatives for the proposed anesthesia with the patient or authorized representative who has  indicated his/her understanding and acceptance.     Dental advisory given  Plan Discussed with: CRNA and Anesthesiologist  Anesthesia Plan Comments:        Anesthesia Quick Evaluation

## 2021-07-27 NOTE — Pre-Procedure Instructions (Signed)
Instructed patient on the following items: Arrival time 0530 Nothing to eat or drink after midnight No meds AM of procedure Responsible person to drive you home and stay with you for 24 hrs  Have you missed any doses of anti-coagulant Eliquis- hasn't missed any doses    

## 2021-07-28 ENCOUNTER — Other Ambulatory Visit: Payer: Self-pay

## 2021-07-28 ENCOUNTER — Ambulatory Visit (HOSPITAL_COMMUNITY): Payer: PPO | Admitting: Certified Registered Nurse Anesthetist

## 2021-07-28 ENCOUNTER — Encounter (HOSPITAL_COMMUNITY)
Admission: RE | Disposition: A | Discharge status: Home / Self Care | Payer: Self-pay | Source: Home / Self Care | Attending: Cardiology

## 2021-07-28 ENCOUNTER — Encounter (HOSPITAL_COMMUNITY): Payer: Self-pay | Admitting: Cardiology

## 2021-07-28 ENCOUNTER — Ambulatory Visit (HOSPITAL_COMMUNITY)
Admission: RE | Admit: 2021-07-28 | Discharge: 2021-07-28 | Disposition: A | Discharge status: Home / Self Care | Payer: PPO | Attending: Cardiology | Admitting: Cardiology

## 2021-07-28 DIAGNOSIS — I428 Other cardiomyopathies: Secondary | ICD-10-CM | POA: Diagnosis not present

## 2021-07-28 DIAGNOSIS — E785 Hyperlipidemia, unspecified: Secondary | ICD-10-CM | POA: Insufficient documentation

## 2021-07-28 DIAGNOSIS — Z87891 Personal history of nicotine dependence: Secondary | ICD-10-CM | POA: Insufficient documentation

## 2021-07-28 DIAGNOSIS — N189 Chronic kidney disease, unspecified: Secondary | ICD-10-CM | POA: Insufficient documentation

## 2021-07-28 DIAGNOSIS — Z885 Allergy status to narcotic agent status: Secondary | ICD-10-CM | POA: Insufficient documentation

## 2021-07-28 DIAGNOSIS — Z7901 Long term (current) use of anticoagulants: Secondary | ICD-10-CM | POA: Insufficient documentation

## 2021-07-28 DIAGNOSIS — I2510 Atherosclerotic heart disease of native coronary artery without angina pectoris: Secondary | ICD-10-CM | POA: Insufficient documentation

## 2021-07-28 DIAGNOSIS — Z8249 Family history of ischemic heart disease and other diseases of the circulatory system: Secondary | ICD-10-CM | POA: Insufficient documentation

## 2021-07-28 DIAGNOSIS — Z9989 Dependence on other enabling machines and devices: Secondary | ICD-10-CM | POA: Diagnosis not present

## 2021-07-28 DIAGNOSIS — Z888 Allergy status to other drugs, medicaments and biological substances status: Secondary | ICD-10-CM | POA: Insufficient documentation

## 2021-07-28 DIAGNOSIS — I129 Hypertensive chronic kidney disease with stage 1 through stage 4 chronic kidney disease, or unspecified chronic kidney disease: Secondary | ICD-10-CM | POA: Insufficient documentation

## 2021-07-28 DIAGNOSIS — G4733 Obstructive sleep apnea (adult) (pediatric): Secondary | ICD-10-CM | POA: Insufficient documentation

## 2021-07-28 DIAGNOSIS — I4819 Other persistent atrial fibrillation: Secondary | ICD-10-CM | POA: Insufficient documentation

## 2021-07-28 HISTORY — PX: ATRIAL FIBRILLATION ABLATION: EP1191

## 2021-07-28 LAB — POCT ACTIVATED CLOTTING TIME
Activated Clotting Time: 399 seconds
Activated Clotting Time: 347 seconds
Activated Clotting Time: 347 seconds

## 2021-07-28 SURGERY — ATRIAL FIBRILLATION ABLATION
Anesthesia: General

## 2021-07-28 MED ORDER — HEPARIN SODIUM (PORCINE) 1000 UNIT/ML IJ SOLN
INTRAMUSCULAR | Status: DC | PRN
  Administered 2021-07-28: 15000 [IU] via INTRAVENOUS
  Administered 2021-07-28 (×2): 1000 [IU] via INTRAVENOUS

## 2021-07-28 MED ORDER — ONDANSETRON HCL 4 MG/2ML IJ SOLN: 4.0000 mg | Freq: Four times a day (QID) | INTRAMUSCULAR | Status: DC | PRN

## 2021-07-28 MED ORDER — DOBUTAMINE INFUSION FOR EP/ECHO/NUC (1000 MCG/ML)
INTRAVENOUS | Status: AC
  Filled 2021-07-28: qty 250

## 2021-07-28 MED ORDER — HEPARIN (PORCINE) IN NACL 1000-0.9 UT/500ML-% IV SOLN
INTRAVENOUS | Status: DC | PRN
  Administered 2021-07-28 (×5): 500 mL

## 2021-07-28 MED ORDER — FENTANYL CITRATE (PF) 100 MCG/2ML IJ SOLN
INTRAMUSCULAR | Status: DC | PRN
  Administered 2021-07-28: 100 ug via INTRAVENOUS

## 2021-07-28 MED ORDER — ACETAMINOPHEN 325 MG PO TABS
650.0000 mg | ORAL_TABLET | ORAL | Status: DC | PRN
  Administered 2021-07-28: 650 mg via ORAL
  Filled 2021-07-28 (×3): qty 2

## 2021-07-28 MED ORDER — ONDANSETRON HCL 4 MG/2ML IJ SOLN
INTRAMUSCULAR | Status: DC | PRN
  Administered 2021-07-28: 4 mg via INTRAVENOUS

## 2021-07-28 MED ORDER — SODIUM CHLORIDE 0.9% FLUSH: 3.0000 mL | INTRAVENOUS | Status: DC | PRN

## 2021-07-28 MED ORDER — SODIUM CHLORIDE 0.9 % IV SOLN: 250.0000 mL | INTRAVENOUS | Status: DC | PRN

## 2021-07-28 MED ORDER — ROCURONIUM BROMIDE 10 MG/ML (PF) SYRINGE
PREFILLED_SYRINGE | INTRAVENOUS | Status: DC | PRN
  Administered 2021-07-28: 60 mg via INTRAVENOUS

## 2021-07-28 MED ORDER — HEPARIN SODIUM (PORCINE) 1000 UNIT/ML IJ SOLN
INTRAMUSCULAR | Status: AC
  Filled 2021-07-28: qty 1

## 2021-07-28 MED ORDER — APIXABAN 5 MG PO TABS
5.0000 mg | ORAL_TABLET | Freq: Once | ORAL | Status: AC
  Administered 2021-07-28: 5 mg via ORAL
  Filled 2021-07-28 (×2): qty 1

## 2021-07-28 MED ORDER — PHENYLEPHRINE HCL-NACL 20-0.9 MG/250ML-% IV SOLN
INTRAVENOUS | Status: DC | PRN
  Administered 2021-07-28: 40 ug/min via INTRAVENOUS

## 2021-07-28 MED ORDER — HEPARIN (PORCINE) IN NACL 1000-0.9 UT/500ML-% IV SOLN
INTRAVENOUS | Status: AC
  Filled 2021-07-28: qty 500

## 2021-07-28 MED ORDER — DOBUTAMINE IN D5W 4-5 MG/ML-% IV SOLN
INTRAVENOUS | Status: DC | PRN
  Administered 2021-07-28: 20 ug/kg/min via INTRAVENOUS

## 2021-07-28 MED ORDER — DEXAMETHASONE SODIUM PHOSPHATE 10 MG/ML IJ SOLN
INTRAMUSCULAR | Status: DC | PRN
  Administered 2021-07-28: 5 mg via INTRAVENOUS

## 2021-07-28 MED ORDER — LIDOCAINE 2% (20 MG/ML) 5 ML SYRINGE
INTRAMUSCULAR | Status: DC | PRN
  Administered 2021-07-28: 60 mg via INTRAVENOUS

## 2021-07-28 MED ORDER — HEPARIN SODIUM (PORCINE) 1000 UNIT/ML IJ SOLN
INTRAMUSCULAR | Status: DC | PRN
  Administered 2021-07-28: 1000 [IU] via INTRAVENOUS

## 2021-07-28 MED ORDER — SODIUM CHLORIDE 0.9% FLUSH: 3.0000 mL | Freq: Two times a day (BID) | INTRAVENOUS | Status: DC

## 2021-07-28 MED ORDER — SUGAMMADEX SODIUM 200 MG/2ML IV SOLN
INTRAVENOUS | Status: DC | PRN
  Administered 2021-07-28: 200 mg via INTRAVENOUS

## 2021-07-28 MED ORDER — PROTAMINE SULFATE 10 MG/ML IV SOLN
INTRAVENOUS | Status: DC | PRN
  Administered 2021-07-28: 40 mg via INTRAVENOUS

## 2021-07-28 MED ORDER — PROPOFOL 10 MG/ML IV BOLUS
INTRAVENOUS | Status: DC | PRN
  Administered 2021-07-28: 120 mg via INTRAVENOUS

## 2021-07-28 MED ORDER — SODIUM CHLORIDE 0.9 % IV SOLN: INTRAVENOUS | Status: DC

## 2021-07-28 SURGICAL SUPPLY — 20 items
BAG SNAP BAND KOVER 36X36 (MISCELLANEOUS) ×1 IMPLANT
BLANKET WARM UNDERBOD FULL ACC (MISCELLANEOUS) ×2 IMPLANT
CATH OCTARAY 2.0 F 3-3-3-3-3 (CATHETERS) ×1 IMPLANT
CATH S CIRCA THERM PROBE 10F (CATHETERS) ×1 IMPLANT
CATH SMTCH THERMOCOOL SF DF (CATHETERS) ×1 IMPLANT
CATH SOUNDSTAR ECO 8FR (CATHETERS) ×1 IMPLANT
CATH WEBSTER BI DIR CS D-F CRV (CATHETERS) ×1 IMPLANT
CLOSURE PERCLOSE PROSTYLE (VASCULAR PRODUCTS) ×4 IMPLANT
COVER SWIFTLINK CONNECTOR (BAG) ×2 IMPLANT
KIT VERSACROSS STEERABLE D1 (CATHETERS) ×1 IMPLANT
MAT PREVALON FULL STRYKER (MISCELLANEOUS) ×1 IMPLANT
PACK EP LATEX FREE (CUSTOM PROCEDURE TRAY) ×2
PACK EP LF (CUSTOM PROCEDURE TRAY) ×1 IMPLANT
PATCH CARTO3 (PAD) ×1 IMPLANT
SHEATH CARTO VIZIGO SM CVD (SHEATH) ×1 IMPLANT
SHEATH PINNACLE 7F 10CM (SHEATH) ×1 IMPLANT
SHEATH PINNACLE 8F 10CM (SHEATH) ×2 IMPLANT
SHEATH PINNACLE 9F 10CM (SHEATH) ×1 IMPLANT
SHEATH PROBE COVER 6X72 (BAG) ×1 IMPLANT
TUBING SMART ABLATE COOLFLOW (TUBING) ×1 IMPLANT

## 2021-07-28 NOTE — Transfer of Care (Signed)
Immediate Anesthesia Transfer of Care Note  Patient: Claudia Cantrell  Procedure(s) Performed: ATRIAL FIBRILLATION ABLATION  Patient Location: PACU  Anesthesia Type:General  Level of Consciousness: awake  Airway & Oxygen Therapy: Patient Spontanous Breathing and Patient connected to nasal cannula oxygen  Post-op Assessment: Report given to RN and Post -op Vital signs reviewed and stable  Post vital signs: Reviewed and stable  Last Vitals:  Vitals Value Taken Time  BP 136/62 07/28/21 1013  Temp 36.4 C 07/28/21 1013  Pulse 72 07/28/21 1013  Resp 21 07/28/21 1013  SpO2 94 % 07/28/21 1013  Vitals shown include unvalidated device data.  Last Pain:  Vitals:   07/28/21 1013  TempSrc: Temporal  PainSc: Asleep      Patients Stated Pain Goal: 3 ([id number])  Complications: There were no known notable events for this encounter.

## 2021-07-28 NOTE — H&P (Signed)
Electrophysiology Office Note   Date:  07/28/2021   ID:  Claudia Cantrell, DOB [birth year], MRN [id number]  PCP:  Girtha Rm, NP-C  Cardiologist:  Claiborne Billings Primary Electrophysiologist:  Iowa Kappes Meredith Leeds, MD    Chief Complaint: atrial fibrillation   History of Present Illness: Claudia Cantrell is a 75 y.o. female who is being seen today for the evaluation of atrial fibrillation at the request of No ref. provider found. Presenting today for electrophysiology evaluation.  She has a history significant for coronary artery disease, CKD, hypertension, hyperlipidemia, nonischemic cardiomyopathy, and atrial fibrillation.  She has a history of myocardial infarction in 19 [id number].  She also has obstructive sleep apnea on CPAP.  She developed atrial fibrillation in 2017.  She underwent TEE and cardioversion at the time.  She was started on Eliquis.  Today, denies symptoms of palpitations, chest pain, shortness of breath, orthopnea, PND, lower extremity edema, claudication, dizziness, presyncope, syncope, bleeding, or neurologic sequela. The patient is tolerating medications without difficulties. Plan ablation today.    Past Medical History:  Diagnosis Date   Arthritis    rheumatoid   CAD (coronary artery disease)    a. 1992 s/p MI and PTCA of unknown vessel;  b. 09/2010 Cath: LM nl, LAD 20p, LCX 90m RCA 178mRPL 20.   Cerv[place] ([facility]   Chronic kidney disease    Family history of anesthesia complication    daughter has difficulty waking    GERD (gastroesophageal reflux disease)    occ   Gout 10/08/2016   Hyperlipidemia    Hypertensive heart disease    Hypothyroidism    Nonischemic cardiomyopathy ([place]   a. 07/2016 Echo: EF 40-45%, mild LVH, inferior akinesis, moderately dilated left atrium, trivial AI and MR.   PAF (paroxysmal atrial fibrillation) ([facility]   a. 07/2016 Admitted w/ AF RVR-->CHA2DS2VASc = 5-->Eliquis;  b. 07/2016 successful TEE/DCCV.   Sleep apnea    a. Using CPAP.    Past Surgical History:  Procedure Laterality Date   [place]  09/[place]/2019   Procedure: BIOPSY;  Surgeon: MaRush LandmarkaTelford Nab MD;  [facility];  Service: Gastroenterology;;   BIOPSY  12/09/2019   Procedure: BIOPSY;  Surgeon: MaIrving Copas MD;  [facility];  Service: Gastroenterology;;   BIOPSY  03/08/2021   Procedure: BIOPSY;  Surgeon: MaIrving Copas MD;  [facility];  Service: Gastroenterology;;   [place] PT[place] BY DR CH[place]a/A 08/01/2016   Procedure: CARDIOVERSION;  Surgeon: BrLelon PerlaMD;  [facility];  Service: Cardiovascular;  Laterality: N/A;   CARDIOVERSION N/A 01/11/2020   Procedure: CARDIOVERSION;  Surgeon: NiJosue HectorMD;  [facility] Service: Cardiovascular;  Laterality: N/A;   CARDIOVERSION N/A 07/08/2020   Procedure: CARDIOVERSION;  Surgeon: ScDonato HeinzMD;  [facility] Service: Cardiovascular;  Laterality: N/A;   ENDOSCOPIC MUCOSAL RESECTION N/A 12/09/2019   Procedure: ENDOSCOPIC MUCOSAL RESECTION;  Surgeon: MaIrving Copas MD;  [facility];  Service: Gastroenterology;  Laterality: N/A;   ESOPHAGOGASTRODUODENOSCOPY N/A 03/08/2021   Procedure: ESOPHAGOGASTRODUODENOSCOPY (EGD);  Surgeon: MaIrving Copas MD;  [facility];  Service: Gastroenterology;  Laterality: N/A;   ESOPHAGOGASTRODUODENOSCOPY (EGD) WITH PROPOFOL N/A 09/02/2019   Procedure: ESOPHAGOGASTRODUODENOSCOPY (EGD) WITH PROPOFOL;  Surgeon: MaRush LandmarkaTelford Nab MD;  [facility];  Service: Gastroenterology;  Laterality: N/A;   ESOPHAGOGASTRODUODENOSCOPY (EGD) WITH PROPOFOL N/A 12/09/2019   Procedure: ESOPHAGOGASTRODUODENOSCOPY (EGD) WITH PROPOFOL;  Surgeon: Rush Landmark Telford Nab., MD;  [facility];  Service: Gastroenterology;  Laterality:  N/A;   EUS N/A 09/02/2019   Procedure: UPPER ENDOSCOPIC ULTRASOUND (EUS) RADIAL;  Surgeon: Irving Copas., MD;  [facility];  Service: Gastroenterology;  Laterality: N/A;  EUS radial/linear   EUS N/A 12/09/2019   Procedure: UPPER ENDOSCOPIC ULTRASOUND (EUS) RADIAL;  Surgeon: Irving Copas., MD;  [facility];  Service: Gastroenterology;  Laterality: N/A;   EUS  12/09/2019   Procedure: UPPER ENDOSCOPIC ULTRASOUND (EUS) LINEAR;  Surgeon: Irving Copas., MD;  [facility];  Service: Gastroenterology;;   EUS N/A 03/08/2021   Procedure: UPPER ENDOSCOPIC ULTRASOUND (EUS) RADIAL;  Surgeon: Irving Copas., MD;  [facility];  Service: Gastroenterology;  Laterality: N/A;   FINE NEEDLE ASPIRATION  09/02/2019   Procedure: FINE NEEDLE ASPIRATION (FNA) LINEAR;  Surgeon: Irving Copas., MD;  [facility];  Service: Gastroenterology;;   FINE NEEDLE ASPIRATION  12/09/2019   Procedure: FINE NEEDLE ASPIRATION (FNA) LINEAR;  Surgeon: Irving Copas., MD;  [facility];  Service: Gastroenterology;;   HEMOSTASIS CLIP PLACEMENT  12/09/2019   Procedure: HEMOSTASIS CLIP PLACEMENT;  Surgeon: Irving Copas., MD;  [facility];  Service: Gastroenterology;;   ORIF ANKLE FRACTURE Right 04/27/2015   Procedure: OPEN REDUCTION INTERNAL FIXATION (ORIF) RIGHT BIMALLEOLAR ANKLE FRACTURE WITH SYNDESMOSIS FIXATION;  Surgeon: Leandrew Koyanagi, MD;  Location: [place];  Service: Orthopedics;  Laterality: Right;   SUBMUCOSAL LIFTING INJECTION  12/09/2019   Procedure: SUBMUCOSAL LIFTING INJECTION;  Surgeon: Irving Copas., MD;  [facility];  Service: Gastroenterology;;   TEE WITHOUT CARDIOVERSION N/A 08/01/2016   Procedure: TRANSESOPHAGEAL ECHOCARDIOGRAM (TEE);  Surgeon: Lelon Perla, MD;  [facility];  Service: Cardiovascular;  Laterality: N/A;   THYROIDECTOMY  11/24/2013   DR  Dalbert Batman   THYROIDECTOMY N/A 11/24/2013   Procedure: TOTAL THYROIDECTOMY;  Surgeon: Adin Hector, MD;  Location: [place];  Service: General;  Laterality: N/A;   TRANSTHORACIC ECHOCARDIOGRAM  01/15/2013   EF 55% TO 65%. PROBABLE MILD HYPOKINESIS OF THE INFERIOR MYOCARDIUM. GRADE 1 DIASTOLIC DYSFUNCTION. TRIAL AR.LA IS MILDLY DILATED.     Current Facility-Administered Medications  Medication Dose Route Frequency Provider Last Rate Last Admin   0.9 %  sodium chloride infusion   Intravenous Continuous Constance Haw, MD 50 mL/hr at 07/28/21 0602 New Bag at 07/28/21 0602    Allergies:   Labetalol and Codeine   Social History:  The patient  reports that she quit smoking about 30 years ago. Her smoking use included cigarettes. She has a 15.00 pack-year smoking history. She has never used smokeless tobacco. She reports current alcohol use. She reports that she does not use drugs.   Family History:  The patient's family history includes Bone cancer in her sister; Diabetes in her father; Heart disease in her brother and mother; Melanoma in her brother; Stroke in her father; Sudden death in her brother and mother.   ROS:  Please see the history of present illness.   Otherwise, review of systems is positive for none.   All other systems are reviewed and negative.   PHYSICAL EXAM: VS:  BP 136/81   Pulse (!) 48   Temp 98.5 F (36.9 C) (Oral)   Ht '5\' 5"'$  (1.651 m)   Wt 98.4 kg   SpO2 100%   BMI  36.11 kg/m  , BMI Body mass index is 36.11 kg/m. GEN: Well nourished, well developed, in no acute distress  HEENT: normal  Neck: no JVD, carotid bruits, or masses Cardiac: irregular; no murmurs, rubs, or gallops,no edema  Respiratory:  clear to auscultation bilaterally, normal work of breathing GI: soft, nontender, nondistended, + BS MS: no deformity or atrophy  Skin: warm and dry Neuro:  Strength and sensation are intact Psych: euthymic mood, full affect  Recent Labs: 03/24/2021: ALT 16;  Magnesium 2.3; TSH 4.570 07/14/2021: BUN 12; Creatinine, Ser 0.93; Hemoglobin 15.9; Platelets 240; Potassium 4.1; Sodium 143    Lipid Panel     Component Value Date/Time   CHOL 150 03/24/2021 1113   TRIG 126 03/24/2021 1113   HDL 41 03/24/2021 1113   CHOLHDL 3.7 03/24/2021 1113   CHOLHDL 3.4 09/22/2019 0226   VLDL 19 09/22/2019 0226   LDLCALC 86 03/24/2021 1113     Wt Readings from Last 3 Encounters:  07/28/21 98.4 kg  05/25/21 98 kg  05/05/21 97.3 kg      Other studies Reviewed: Additional studies/ records that were reviewed today include: TTE 04/24/21  Review of the above records today demonstrates:   1. Left ventricular ejection fraction, by estimation, is 55 to 60%. The  left ventricle has normal function. The left ventricle has no regional  wall motion abnormalities. There is mild left ventricular hypertrophy.  Left ventricular diastolic parameters  are indeterminate.   2. Right ventricular systolic function is normal. The right ventricular  size is normal.   3. Left atrial size was mildly dilated.   4. The mitral valve is normal in structure. Mild mitral valve  regurgitation. No evidence of mitral stenosis.   5. The aortic valve is normal in structure. Aortic valve regurgitation is  mild. No aortic stenosis is present.   6. The inferior vena cava is normal in size with greater than 50%  respiratory variability, suggesting right atrial pressure of 3 mmHg.    ASSESSMENT AND PLAN:  1.  Persistent atrial fibrillation: CAIDENCE DEAS has presented today for surgery, with the diagnosis of atrial fibrillation.  The various methods of treatment have been discussed with the patient and family. After consideration of risks, benefits and other options for treatment, the patient has consented to  Procedure(s): Catheter ablation as a surgical intervention .  Risks include but not limited to complete heart block, stroke, esophageal damage, nerve damage, bleeding, vascular damage,  tamponade, perforation, MI, and death. The patient's history has been reviewed, patient examined, no change in status, stable for surgery.  I have reviewed the patient's chart and labs.  Questions were answered to the patient's satisfaction.    Schyler Counsell Curt Bears, MD 07/28/2021 7:11 AM

## 2021-07-28 NOTE — Anesthesia Procedure Notes (Signed)
Procedure Name: Intubation Date/Time: 07/28/2021 7:50 AM Performed by: Reece Agar, CRNA Pre-anesthesia Checklist: Patient identified, Emergency Drugs available, Suction available and Patient being monitored Patient Re-evaluated:Patient Re-evaluated prior to induction Oxygen Delivery Method: Circle System Utilized Preoxygenation: Pre-oxygenation with 100% oxygen Induction Type: IV induction Ventilation: Mask ventilation without difficulty and Oral airway inserted - appropriate to patient size Laryngoscope Size: Mac and 3 Grade View: Grade I Tube type: Oral Tube size: 7.0 mm Number of attempts: 1 Airway Equipment and Method: Stylet and Oral airway Placement Confirmation: ETT inserted through vocal cords under direct vision, positive ETCO2 and breath sounds checked- equal and bilateral Secured at: 22 cm Tube secured with: Tape Dental Injury: Teeth and Oropharynx as per pre-operative assessment

## 2021-07-28 NOTE — Anesthesia Postprocedure Evaluation (Signed)
Anesthesia Post Note  Patient: Claudia Cantrell  Procedure(s) Performed: ATRIAL FIBRILLATION ABLATION     Patient location during evaluation: PACU Anesthesia Type: General Level of consciousness: awake and alert Pain management: pain level controlled Vital Signs Assessment: post-procedure vital signs reviewed and stable Respiratory status: spontaneous breathing, nonlabored ventilation and respiratory function stable Cardiovascular status: stable and blood pressure returned to baseline Anesthetic complications: no   There were no known notable events for this encounter.  Last Vitals:  Vitals:   07/28/21 1043 07/28/21 1045  BP:  129/63  Pulse:  64  Resp:  12  Temp: (!) 36.3 C   SpO2:  95%    Last Pain:  Vitals:   07/28/21 1043  TempSrc: Temporal  PainSc: 0-No pain                 Audry Pili

## 2021-07-28 NOTE — Discharge Instructions (Addendum)
Post procedure care instructions No driving for 4 days. No lifting over 5 lbs for 1 week. No vigorous or sexual activity for 1 week. You may return to work/your usual activities on 08/05/21. Keep procedure site clean & dry. If you notice increased pain, swelling, bleeding or pus, call/return!  You may shower after 24 hours, but no soaking in baths/hot tubs/pools for 1 week.    You have an appointment set up with the [facility].  Multiple studies have shown that being followed by a dedicated atrial fibrillation clinic in addition to the standard care you receive from your other physicians improves health. We believe that enrollment in the atrial fibrillation clinic will allow [place] to better care for you.   The phone number to the [facility] is [phone]. The clinic is staffed Monday through Friday from 8:30am to 5pm.  Parking Directions: The clinic is located in the Heart and Vascular Building connected to [facility]. 1)From [address] turn on to [address] and go to the 3rd entrance  (Heart and Vascular entrance) on the right. 2)Look to the right for Heart &Vascular Parking Garage. 3)A code for the entrance is required, for Sept is 4455.   4)Take the elevators to the 1st floor. Registration is in the room with the glass walls at the end of the hallway.  If you have any trouble parking or locating the clinic, please don't hesitate to call [phone].   Cardiac Ablation, Care After  This sheet gives you information about how to care for yourself after your procedure. Your health care provider may also give you more specific instructions. If you have problems or questions, contact your health care provider. What can I expect after the procedure? After the procedure, it is common to have: Bruising around your puncture site. Tenderness around your puncture site. Skipped heartbeats. Tiredness (fatigue). Chest discomfort (dull,  pressure).  Follow these instructions at home: Puncture site care  Follow instructions from your health care provider about how to take care of your puncture site. Make sure you: If present, leave stitches (sutures), skin glue, or adhesive strips in place. These skin closures may need to stay in place for up to 2 weeks. If adhesive strip edges start to loosen and curl up, you may trim the loose edges. Do not remove adhesive strips completely unless your health care provider tells you to do that. If a large square bandage is present, this may be removed 24 hours after surgery.  Check your puncture site every day for signs of infection. Check for: Redness, swelling, or pain. Fluid or blood. If your puncture site starts to bleed, lie down on your back, apply firm pressure to the area, and contact your health care provider. Warmth. Pus or a bad smell. Driving Do not drive for at least 4 days after your procedure or however long your health care provider recommends. (Do not resume driving if you have previously been instructed not to drive for other health reasons.) Do not drive or use heavy machinery while taking prescription pain medicine. Activity Avoid activities that take a lot of effort for at least 7 days after your procedure. Do not lift anything that is heavier than 5 lb (4.5 kg) for one week.  No sexual activity for 1 week.  Return to your normal activities as told by your health care provider. Ask your health care provider what activities are safe for you. General instructions Take over-the-counter and prescription medicines only as told  by your health care provider. Do not use any products that contain nicotine or tobacco, such as cigarettes and e-cigarettes. If you need help quitting, ask your health care provider. You may shower after 24 hours, but Do not take baths, swim, or use a hot tub for 1 week.  Do not drink alcohol for 24 hours after your procedure. Keep all follow-up visits  as told by your health care provider. This is important. Contact a health care provider if: You have redness, mild swelling, or pain around your puncture site. You have fluid or blood coming from your puncture site that stops after applying firm pressure to the area. Your puncture site feels warm to the touch. You have pus or a bad smell coming from your puncture site. You have a fever. You have chest pain or discomfort that spreads to your neck, jaw, or arm. You are sweating a lot. You feel nauseous. You have a fast or irregular heartbeat. You have shortness of breath. You are dizzy or light-headed and feel the need to lie down. You have pain or numbness in the arm or leg closest to your puncture site. Get help right away if: Your puncture site suddenly swells. Your puncture site is bleeding and the bleeding does not stop after applying firm pressure to the area. These symptoms may represent a serious problem that is an emergency. Do not wait to see if the symptoms will go away. Get medical help right away. Call your local emergency services (911 in the [place] Do not drive yourself to the hospital. Summary After the procedure, it is normal to have bruising and tenderness at the puncture site in your groin, neck, or forearm. Check your puncture site every day for signs of infection. Get help right away if your puncture site is bleeding and the bleeding does not stop after applying firm pressure to the area. This is a medical emergency. This information is not intended to replace advice given to you by your health care provider. Make sure you discuss any questions you have with your health care provider.

## 2021-07-28 NOTE — Progress Notes (Addendum)
Pt bled while ambulating to restroom after bedrest. R groin site, level 1, dressing removed and no active bleeding noted. Pressure held for 5 minutes due to c/o "stinging pain" at site. Dressing changed. No more stinging just soreness/pain at site. Tylenol requested and given. Camnitz notified, RN instructed to watch pt for another hour. Will ambulate again in 1 hour.  Ambulated again at 1505. No bleeding or swelling. Pt dressing for D/C.

## 2021-07-30 ENCOUNTER — Encounter (HOSPITAL_COMMUNITY): Payer: Self-pay | Admitting: Cardiology

## 2021-08-03 ENCOUNTER — Telehealth: Payer: Self-pay | Admitting: Cardiology

## 2021-08-03 DIAGNOSIS — I480 Paroxysmal atrial fibrillation: Secondary | ICD-10-CM | POA: Diagnosis not present

## 2021-08-03 DIAGNOSIS — R7303 Prediabetes: Secondary | ICD-10-CM | POA: Diagnosis not present

## 2021-08-03 DIAGNOSIS — K319 Disease of stomach and duodenum, unspecified: Secondary | ICD-10-CM | POA: Diagnosis not present

## 2021-08-03 DIAGNOSIS — E559 Vitamin D deficiency, unspecified: Secondary | ICD-10-CM | POA: Diagnosis not present

## 2021-08-03 DIAGNOSIS — N182 Chronic kidney disease, stage 2 (mild): Secondary | ICD-10-CM | POA: Diagnosis not present

## 2021-08-03 DIAGNOSIS — R809 Proteinuria, unspecified: Secondary | ICD-10-CM | POA: Diagnosis not present

## 2021-08-03 DIAGNOSIS — I129 Hypertensive chronic kidney disease with stage 1 through stage 4 chronic kidney disease, or unspecified chronic kidney disease: Secondary | ICD-10-CM | POA: Diagnosis not present

## 2021-08-03 NOTE — Telephone Encounter (Signed)
Patient transferred into triage directly.  She states she had an ablation with Dr. Curt Bears last Friday 8/19 and has been doing well.  She has been resting and not doing to much since her procedure.  However today after her shower she cleaned the shower and after was SOB with burning in her chest.  After sitting down she feels fine and has an appointment at 11:15 this morning with [facility] that her daughter is taking her to.  As she is not having any problems at present she will go to the appointment and I let her know I would call here around 1pm.

## 2021-08-03 NOTE — Telephone Encounter (Signed)
Called patient after her MD visit.  She was very excited and received a great report.   She is feeling good now and will let us know if she has more problems.  She is aware of her appointment in the afib clinic.

## 2021-08-03 NOTE — Telephone Encounter (Signed)
Pt c/o of Chest Pain: STAT if CP now or developed within 24 hours  1. Are you having CP right now? Yes, tightness upon exertion   2. Are you experiencing any other symptoms (ex. SOB, nausea, vomiting, sweating)? SOB upon exertion feels she will pass out after a couple of steps  3. How long have you been experiencing CP? Started this morning  4. Is your CP continuous or coming and going? Coming and going upon exertion   5. Have you taken Nitroglycerin? No    ?

## 2021-08-18 ENCOUNTER — Ambulatory Visit (INDEPENDENT_AMBULATORY_CARE_PROVIDER_SITE_OTHER): Payer: PPO | Admitting: Family Medicine

## 2021-08-18 ENCOUNTER — Encounter: Payer: Self-pay | Admitting: Family Medicine

## 2021-08-18 ENCOUNTER — Other Ambulatory Visit: Payer: Self-pay

## 2021-08-18 VITALS — BP 124/82 | HR 62 | Temp 98.0°F | Wt 211.2 lb

## 2021-08-18 DIAGNOSIS — R7303 Prediabetes: Secondary | ICD-10-CM

## 2021-08-18 DIAGNOSIS — E890 Postprocedural hypothyroidism: Secondary | ICD-10-CM | POA: Diagnosis not present

## 2021-08-18 DIAGNOSIS — I10 Essential (primary) hypertension: Secondary | ICD-10-CM | POA: Diagnosis not present

## 2021-08-18 DIAGNOSIS — Z7901 Long term (current) use of anticoagulants: Secondary | ICD-10-CM

## 2021-08-18 DIAGNOSIS — Z9989 Dependence on other enabling machines and devices: Secondary | ICD-10-CM

## 2021-08-18 DIAGNOSIS — E785 Hyperlipidemia, unspecified: Secondary | ICD-10-CM | POA: Diagnosis not present

## 2021-08-18 DIAGNOSIS — G4733 Obstructive sleep apnea (adult) (pediatric): Secondary | ICD-10-CM | POA: Diagnosis not present

## 2021-08-18 NOTE — Progress Notes (Signed)
   Subjective:    Patient ID: Claudia Cantrell, female    DOB: January 18, 1946, 75 y.o.   MRN: [id number]  HPI Chief Complaint  Patient presents with   other    F/u pt. Stated had cardiac ablation on 07/28/21 was at home on bed rest, got really bad heartburn after having that done. Pt. Did not want flu shot at this time.   She is here to follow-up on chronic health conditions including hypothyroidism and prediabetes.  Recently had a cardiac ablation.  Reports being able to be more active without having dyspnea.  States she feels much better now that her heart is not normal sinus rhythm. She is followed by cardiology  Hypothyroidism-reports taking Synthroid 88 mcg daily on an empty stomach  Hypertension-reports taking medications without any concerns  She is on Crestor and doing fine  Denies fever, chills, dizziness, chest pain, palpitations, shortness of breath, abdominal pain, N/V/D, urinary symptoms, LE edema.     Review of Systems Pertinent positives and negatives in the history of present illness.     Objective:   Physical Exam BP 124/82   Pulse 62   Temp 98 F (36.7 C)   Wt 211 lb 3.2 oz (95.8 kg)   BMI 35.15 kg/m   Alert and in no distress.  Cardiac exam shows a regular rhythm without murmurs or gallops. Lungs are clear to auscultation.  Extremities without edema.  Skin is warm and dry.  Normal mood.       Assessment & Plan:  Postsurgical hypothyroidism - Plan: TSH  Prediabetes - Plan: Hemoglobin A1c  Chronic anticoagulation  Dyslipidemia, goal LDL below 70  Essential hypertension  OSA on CPAP  She is here today for a follow-up on hypothyroidism and prediabetes.  She is followed closely by cardiology.  Reports cardiopulmonary symptoms have improved since ablation. She will continue current medications.  Follow-up pending lab results.

## 2021-08-19 LAB — HEMOGLOBIN A1C
Est. average glucose Bld gHb Est-mCnc: 137 mg/dL
Hgb A1c MFr Bld: 6.4 % — ABNORMAL HIGH (ref 4.8–5.6)

## 2021-08-19 LAB — TSH: TSH: 1.2 u[IU]/mL (ref 0.450–4.500)

## 2021-08-20 NOTE — Progress Notes (Signed)
Her thyroid function is in normal range but her A1c is now closer to diabetes range. It was higher at 6.4% (up from 5.8%). cut back on sugar, carbohydrates such as bread, potatoes, pasta, rice, etc. I would like for her to see the endocrinologist as scheduled to get their input as well.

## 2021-08-21 ENCOUNTER — Other Ambulatory Visit: Payer: Self-pay | Admitting: Cardiovascular Disease

## 2021-08-25 ENCOUNTER — Telehealth: Payer: Self-pay

## 2021-08-25 NOTE — Telephone Encounter (Signed)
Pt informed of labs results and she will keep her appt with endocrinologist

## 2021-08-29 ENCOUNTER — Ambulatory Visit (HOSPITAL_COMMUNITY)
Admission: RE | Admit: 2021-08-29 | Discharge: 2021-08-29 | Disposition: A | Discharge status: Home / Self Care | Payer: PPO | Source: Ambulatory Visit | Attending: Nurse Practitioner | Admitting: Nurse Practitioner

## 2021-08-29 ENCOUNTER — Other Ambulatory Visit: Payer: Self-pay

## 2021-08-29 VITALS — BP 140/72 | HR 56 | Ht 65.0 in | Wt 214.4 lb

## 2021-08-29 DIAGNOSIS — I4819 Other persistent atrial fibrillation: Secondary | ICD-10-CM | POA: Diagnosis not present

## 2021-08-29 DIAGNOSIS — Z79890 Hormone replacement therapy (postmenopausal): Secondary | ICD-10-CM | POA: Insufficient documentation

## 2021-08-29 DIAGNOSIS — Z7984 Long term (current) use of oral hypoglycemic drugs: Secondary | ICD-10-CM | POA: Diagnosis not present

## 2021-08-29 DIAGNOSIS — Z8249 Family history of ischemic heart disease and other diseases of the circulatory system: Secondary | ICD-10-CM | POA: Insufficient documentation

## 2021-08-29 DIAGNOSIS — Z7901 Long term (current) use of anticoagulants: Secondary | ICD-10-CM | POA: Insufficient documentation

## 2021-08-29 DIAGNOSIS — I10 Essential (primary) hypertension: Secondary | ICD-10-CM | POA: Diagnosis not present

## 2021-08-29 DIAGNOSIS — D6869 Other thrombophilia: Secondary | ICD-10-CM

## 2021-08-29 DIAGNOSIS — Z79899 Other long term (current) drug therapy: Secondary | ICD-10-CM | POA: Insufficient documentation

## 2021-08-29 DIAGNOSIS — Z955 Presence of coronary angioplasty implant and graft: Secondary | ICD-10-CM | POA: Insufficient documentation

## 2021-08-29 DIAGNOSIS — Z87891 Personal history of nicotine dependence: Secondary | ICD-10-CM | POA: Diagnosis not present

## 2021-08-30 ENCOUNTER — Encounter (HOSPITAL_COMMUNITY): Payer: Self-pay | Admitting: Nurse Practitioner

## 2021-08-30 NOTE — Progress Notes (Signed)
Primary Care Physician: Girtha Rm, NP-C Referring Physician: Dr. Elmore Guise is a 75 y.o. female with a h/o afib that one month s/p ablation in the afib clinic for f/u. She is in SR and has not noted any afib. She feels well. No swallowing or groin issues.   Today, she denies symptoms of palpitations, chest pain, shortness of breath, orthopnea, PND, lower extremity edema, dizziness, presyncope, syncope, or neurologic sequela. The patient is tolerating medications without difficulties and is otherwise without complaint today.   Past Medical History:  Diagnosis Date   Arthritis    rheumatoid   CAD (coronary artery disease)    a. 1992 s/p MI and PTCA of unknown vessel;  b. 09/2010 Cath: LM nl, LAD 20p, LCX 62m, RCA 69m, RPL 20.   Cervical cancer ([place]) 1977   Chronic kidney disease    Family history of anesthesia complication    daughter has difficulty waking    GERD (gastroesophageal reflux disease)    occ   Gout 10/08/2016   Hyperlipidemia    Hypertensive heart disease    Hypothyroidism    Nonischemic cardiomyopathy ([place])    a. 07/2016 Echo: EF 40-45%, mild LVH, inferior akinesis, moderately dilated left atrium, trivial AI and MR.   PAF (paroxysmal atrial fibrillation) ([place])    a. 07/2016 Admitted w/ AF RVR-->CHA2DS2VASc = 5-->Eliquis;  b. 07/2016 successful TEE/DCCV.   Sleep apnea    a. Using CPAP.   Past Surgical History:  Procedure Laterality Date   ABDOMINAL HYSTERECTOMY  1988   ATRIAL FIBRILLATION ABLATION N/A 07/28/2021   Procedure: ATRIAL FIBRILLATION ABLATION;  Surgeon: Constance Haw, MD;  Location: [facility];  Service: Cardiovascular;  Laterality: N/A;   [place]   BIOPSY  09/02/2019   Procedure: BIOPSY;  Surgeon: Rush Landmark Telford Nab., MD;  Location: [facility];  Service: Gastroenterology;;   BIOPSY  12/09/2019   Procedure: BIOPSY;  Surgeon: Irving Copas., MD;  Location: [facility];  Service:  Gastroenterology;;   BIOPSY  03/08/2021   Procedure: BIOPSY;  Surgeon: Irving Copas., MD;  Location: [facility];  Service: Gastroenterology;;   [place]   PTCA BY DR [place] N/A 08/01/2016   Procedure: CARDIOVERSION;  Surgeon: Lelon Perla, MD;  Location: [facility];  Service: Cardiovascular;  Laterality: N/A;   CARDIOVERSION N/A 01/11/2020   Procedure: CARDIOVERSION;  Surgeon: Josue Hector, MD;  Location: [facility];  Service: Cardiovascular;  Laterality: N/A;   CARDIOVERSION N/A 07/08/2020   Procedure: CARDIOVERSION;  Surgeon: [name], MD;  Location: [facility];  Service: Cardiovascular;  Laterality: N/A;   ENDOSCOPIC MUCOSAL RESECTION N/A 12/09/2019   Procedure: ENDOSCOPIC MUCOSAL RESECTION;  Surgeon: Irving Copas., MD;  Location: [facility];  Service: Gastroenterology;  Laterality: N/A;   ESOPHAGOGASTRODUODENOSCOPY N/A 03/08/2021   Procedure: ESOPHAGOGASTRODUODENOSCOPY (EGD);  Surgeon: Irving Copas., MD;  Location: [facility];  Service: Gastroenterology;  Laterality: N/A;   ESOPHAGOGASTRODUODENOSCOPY (EGD) WITH PROPOFOL N/A 09/02/2019   Procedure: ESOPHAGOGASTRODUODENOSCOPY (EGD) WITH PROPOFOL;  Surgeon: Rush Landmark Telford Nab., MD;  Location: [facility];  Service: Gastroenterology;  Laterality: N/A;   ESOPHAGOGASTRODUODENOSCOPY (EGD) WITH PROPOFOL N/A 12/09/2019   Procedure: ESOPHAGOGASTRODUODENOSCOPY (EGD) WITH PROPOFOL;  Surgeon: Rush Landmark Telford Nab., MD;  Location: [facility];  Service: Gastroenterology;  Laterality: N/A;   EUS N/A 09/02/2019   Procedure: UPPER ENDOSCOPIC ULTRASOUND (EUS) RADIAL;  Surgeon: Irving Copas., MD;  Location: [facility];  Service: Gastroenterology;  Laterality: N/A;  EUS radial/linear   EUS N/A 12/09/2019   Procedure: UPPER ENDOSCOPIC ULTRASOUND (EUS) RADIAL;  Surgeon: Irving Copas., MD;  Location: [facility];  Service:  Gastroenterology;  Laterality: N/A;   EUS  12/09/2019   Procedure: UPPER ENDOSCOPIC ULTRASOUND (EUS) LINEAR;  Surgeon: Irving Copas., MD;  Location: [facility];  Service: Gastroenterology;;   EUS N/A 03/08/2021   Procedure: UPPER ENDOSCOPIC ULTRASOUND (EUS) RADIAL;  Surgeon: Irving Copas., MD;  Location: [facility];  Service: Gastroenterology;  Laterality: N/A;   FINE NEEDLE ASPIRATION  09/02/2019   Procedure: FINE NEEDLE ASPIRATION (FNA) LINEAR;  Surgeon: Irving Copas., MD;  Location: [facility];  Service: Gastroenterology;;   FINE NEEDLE ASPIRATION  12/09/2019   Procedure: FINE NEEDLE ASPIRATION (FNA) LINEAR;  Surgeon: Irving Copas., MD;  Location: [facility];  Service: Gastroenterology;;   HEMOSTASIS CLIP PLACEMENT  12/09/2019   Procedure: HEMOSTASIS CLIP PLACEMENT;  Surgeon: Irving Copas., MD;  Location: [facility];  Service: Gastroenterology;;   ORIF ANKLE FRACTURE Right 04/27/2015   Procedure: OPEN REDUCTION INTERNAL FIXATION (ORIF) RIGHT BIMALLEOLAR ANKLE FRACTURE WITH SYNDESMOSIS FIXATION;  Surgeon: Leandrew Koyanagi, MD;  Location: [place];  Service: Orthopedics;  Laterality: Right;   SUBMUCOSAL LIFTING INJECTION  12/09/2019   Procedure: SUBMUCOSAL LIFTING INJECTION;  Surgeon: Irving Copas., MD;  Location: [facility];  Service: Gastroenterology;;   TEE WITHOUT CARDIOVERSION N/A 08/01/2016   Procedure: TRANSESOPHAGEAL ECHOCARDIOGRAM (TEE);  Surgeon: Lelon Perla, MD;  Location: [facility];  Service: Cardiovascular;  Laterality: N/A;   THYROIDECTOMY  11/24/2013   DR Dalbert Batman   THYROIDECTOMY N/A 11/24/2013   Procedure: TOTAL THYROIDECTOMY;  Surgeon: Adin Hector, MD;  Location: [place];  Service: General;  Laterality: N/A;   TRANSTHORACIC ECHOCARDIOGRAM  01/15/2013   EF 55% TO 65%. PROBABLE MILD HYPOKINESIS OF THE INFERIOR MYOCARDIUM. GRADE 1 DIASTOLIC DYSFUNCTION. TRIAL AR.LA IS MILDLY DILATED.    Current Outpatient  Medications  Medication Sig Dispense Refill   acetaminophen (TYLENOL) 650 MG CR tablet Take 1,300 mg by mouth every 8 (eight) hours as needed for pain.      allopurinol (ZYLOPRIM) 100 MG tablet Take 100 mg by mouth in the morning.     apixaban (ELIQUIS) 5 MG TABS tablet Take 1 tablet (5 mg total) by mouth 2 (two) times daily. 180 tablet 1   carvedilol (COREG) 25 MG tablet Take 1 tablet (25 mg total) by mouth 2 (two) times daily with a meal. 180 tablet 3   FARXIGA 10 MG TABS tablet Take 10 mg by mouth in the morning.     hydrALAZINE (APRESOLINE) 10 MG tablet Take 1 tablet (10 mg total) by mouth 2 (two) times daily. 180 tablet 3   hydrochlorothiazide (MICROZIDE) 12.5 MG capsule Take 1 capsule by mouth in the morning (Patient taking differently: Take 12.5 mg by mouth daily.) 90 capsule 1   isosorbide dinitrate (ISORDIL) 5 MG tablet Take 1 tablet (5 mg total) by mouth 2 (two) times daily. 180 tablet 3   Magnesium 400 MG TABS Take 400 mg by mouth every evening.     Multiple Vitamins-Minerals (MULTIVITAMIN WITH MINERALS) tablet Take 1 tablet by mouth 3 (three) times a week.     olmesartan (BENICAR) 40 MG tablet Take 40 mg by mouth in the morning.     Probiotic Product (PROBIOTIC PO) Take 1 capsule by mouth at bedtime.     rosuvastatin (CRESTOR) 40 MG tablet Take 1 tablet (  40 mg total) by mouth daily. (Patient taking differently: Take 40 mg by mouth in the morning.) 90 tablet 1   SYNTHROID 88 MCG tablet Take 1 tablet by mouth once daily (Patient taking differently: Take 88 mcg by mouth daily before breakfast.) 90 tablet 0   Vitamin D3 (VITAMIN D) 25 MCG tablet Take 1,000 Units by mouth daily in the afternoon.     zinc gluconate 50 MG tablet Take 50 mg by mouth daily in the afternoon.     No current facility-administered medications for this encounter.    Allergies  Allergen Reactions   Labetalol     headaches   Codeine Anxiety    Social History   Socioeconomic History   Marital status:  Widowed    Spouse name: Not on file   Number of children: 3   Years of education: Not on file   Highest education level: Not on file  Occupational History   Occupation: retired  Tobacco Use   Smoking status: Former    Packs/day: 1.00    Years: 15.00    Pack years: 15.00    Types: Cigarettes    Quit date: 12/10/1990    Years since quitting: 30.7   Smokeless tobacco: Never  Vaping Use   Vaping Use: Never used  Substance and Sexual Activity   Alcohol use: Yes    Comment: very rare   Drug use: No   Sexual activity: Not on file  Other Topics Concern   Not on file  Social History Narrative   Right handed    Living  alone.   Social Determinants of Health   Financial Resource Strain: Not on file  Food Insecurity: Not on file  Transportation Needs: Not on file  Physical Activity: Not on file  Stress: Not on file  Social Connections: Not on file  Intimate Partner Violence: Not on file    Family History  Problem Relation Age of Onset   Heart disease Mother    Sudden death Mother    Diabetes Father    Stroke Father    Bone cancer Sister    Sudden death Brother    Melanoma Brother    Heart disease Brother    Colon cancer Neg Hx    Esophageal cancer Neg Hx    Inflammatory bowel disease Neg Hx    Liver disease Neg Hx    Pancreatic cancer Neg Hx    Rectal cancer Neg Hx    Stomach cancer Neg Hx     ROS- All systems are reviewed and negative except as per the HPI above  Physical Exam: Vitals:   08/29/21 1452  BP: 140/72  Pulse: (!) 56  Weight: 97.3 kg  Height: 5\' 5"  (1.651 m)   Wt Readings from Last 3 Encounters:  08/29/21 97.3 kg  08/18/21 95.8 kg  07/28/21 98.4 kg    Labs: Lab Results  Component Value Date   NA 143 07/14/2021   K 4.1 07/14/2021   CL 104 07/14/2021   CO2 24 07/14/2021   GLUCOSE 87 07/14/2021   BUN 12 07/14/2021   CREATININE 0.93 07/14/2021   CALCIUM 9.6 07/14/2021   MG 2.3 03/24/2021   Lab Results  Component Value Date   INR 1.3  (H) 08/18/2019   Lab Results  Component Value Date   CHOL 150 03/24/2021   HDL 41 03/24/2021   LDLCALC 86 03/24/2021   TRIG 126 03/24/2021     GEN- The patient is well appearing, alert and oriented  x 3 today.   Head- normocephalic, atraumatic Eyes-  Sclera clear, conjunctiva pink Ears- hearing intact Oropharynx- clear Neck- supple, no JVP Lymph- no cervical lymphadenopathy Lungs- Clear to ausculation bilaterally, normal work of breathing Heart- Regular rate and rhythm, no murmurs, rubs or gallops, PMI not laterally displaced GI- soft, NT, ND, + BS Extremities- no clubbing, cyanosis, or edema MS- no significant deformity or atrophy Skin- no rash or lesion Psych- euthymic mood, full affect Neuro- strength and sensation are intact  EKG-sinus brady at 56 bpm, pr int 118 ms, qrs int 99 ms, qtc 397 ms  Epic records reviewed    Assessment and Plan:  1. Afib In SR, no afib noted Doing well s/p ablation  Continue metoprolol at current dose   2. CHA2DS2VASc of at least 5 Continue xarelto 20 mg daily   3. HTN Stable  F/u with Dr. Curt Bears as scheduled in November    Zackry Deines C. Hermann Dottavio, [facility] [address] [place], [place] [id number] [phone]

## 2021-09-08 ENCOUNTER — Ambulatory Visit: Payer: PPO | Admitting: Internal Medicine

## 2021-09-20 ENCOUNTER — Other Ambulatory Visit: Payer: Self-pay | Admitting: Cardiovascular Disease

## 2021-10-04 NOTE — Progress Notes (Signed)
Office Visit Note  Patient: Claudia Cantrell             Date of Birth: [birth year]           MRN: [id number]             PCP: Davy Pique Referring: Girtha Rm, PA-C Visit Date: 10/18/2021 Occupation: @GUAROCC @  Subjective:  Medication monitoring   History of Present Illness: Claudia Cantrell is a 75 y.o. female with history of gout.  She is taking allopurinol 100 mg daily for management of gout.  She continues to tolerate allopurinol without any side effects.  She denies any signs or symptoms of a gout flare.  She avoids red meat, shellfish, and alcohol use.  She is been experiencing left stiffness in both hands.  She denies any joint swelling at this time.  She continues to try to work on weight loss.  She will be establishing care with endocrinology due to elevated hemoglobin A1c in September 2022.  Patient reports that she underwent a cardiac ablation in September 2022.  She has not returned to A. fib since then.  She remains on Eliquis as prescribed.  She has an upcoming appointment with her cardiologist on 11/01/2021.  She has noticed less edema in her hands and feet since having the ablation performed.    Activities of Daily Living:  Patient reports morning stiffness for 30 minutes.   Patient Denies nocturnal pain.  Difficulty dressing/grooming: Denies Difficulty climbing stairs: Reports Difficulty getting out of chair: Denies Difficulty using hands for taps, buttons, cutlery, and/or writing: Denies  Review of Systems  Constitutional:  Positive for fatigue.  HENT:  Negative for mouth sores, mouth dryness and nose dryness.   Eyes:  Negative for pain and dryness.  Respiratory:  Positive for shortness of breath. Negative for difficulty breathing.   Cardiovascular:  Negative for swelling in legs/feet.  Gastrointestinal:  Negative for constipation and diarrhea.  Endocrine: Negative for increased urination.  Genitourinary:  Positive for nocturia. Negative for painful  urination.  Musculoskeletal:  Positive for morning stiffness. Negative for joint pain, joint pain, joint swelling, myalgias, muscle tenderness and myalgias.  Skin:  Negative for color change, rash and redness.  Allergic/Immunologic: Negative for susceptible to infections.  Neurological:  Negative for dizziness, headaches and memory loss.  Hematological:  Negative for bruising/bleeding tendency.  Psychiatric/Behavioral:  Negative for confusion.    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   Dyslipidemia, goal LDL below 70 05/02/2020   Acute combined systolic and diastolic CHF, NYHA class 3 ([place]) 09/23/2019   Persistent atrial fibrillation ([place]) 09/21/2019   Submucosal lesion of stomach 07/19/2019   Abnormal CT of the abdomen 07/19/2019   Abnormal CT scan, stomach 06/17/2019   Advance directive declined by patient 01/14/2019   Chronic anticoagulation 01/14/2019   CRI (chronic renal insufficiency), stage 2 (mild) 01/14/2019   Prediabetes 01/14/2019   Persistent proteinuria 02/17/2018   Hyperuricemia 03/26/2017   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 1992 s/p MI and PTCA of unknown vessel;  b. 09/2010 Cath: LM nl, LAD 20p, LCX 19m, RCA 75m, RPL 20.   Cervical cancer ([place]) 1977   Chronic kidney disease    Family history of  anesthesia complication    daughter has difficulty waking    GERD (gastroesophageal reflux disease)    occ   Gout 10/08/2016   Hyperlipidemia    Hypertensive heart disease    Hypothyroidism    Nonischemic cardiomyopathy ([place])    a. 07/2016 Echo: EF 40-45%, mild LVH, inferior akinesis, moderately dilated left atrium, trivial AI and MR.   PAF (paroxysmal atrial fibrillation) ([place])    a. 07/2016 Admitted w/ AF RVR-->CHA2DS2VASc = 5-->Eliquis;  b. 07/2016 successful TEE/DCCV.   Sleep apnea    a. Using CPAP.    Family History  Problem Relation Age of Onset   Heart disease Mother    Sudden death Mother    Diabetes Father    Stroke Father    Bone cancer Sister    Sudden death Brother    Melanoma Brother    Heart disease Brother    Colon cancer Neg Hx    Esophageal cancer Neg Hx    Inflammatory bowel disease Neg Hx    Liver disease Neg Hx    Pancreatic cancer Neg Hx    Rectal cancer Neg Hx    Stomach cancer Neg Hx    Past Surgical History:  Procedure Laterality Date   ABDOMINAL HYSTERECTOMY  1988   ATRIAL FIBRILLATION ABLATION N/A 07/28/2021   Procedure: ATRIAL FIBRILLATION ABLATION;  Surgeon: Constance Haw, MD;  Location: [facility];  Service: Cardiovascular;  Laterality: N/A;   [place]   BIOPSY  09/02/2019   Procedure: BIOPSY;  Surgeon: Rush Landmark Telford Nab., MD;  Location: [facility];  Service: Gastroenterology;;   BIOPSY  12/09/2019   Procedure: BIOPSY;  Surgeon: Irving Copas., MD;  Location: [facility];  Service: Gastroenterology;;   BIOPSY  03/08/2021   Procedure: BIOPSY;  Surgeon: Irving Copas., MD;  Location: [facility];  Service: Gastroenterology;;   [place]   PTCA BY DR [place] N/A 08/01/2016   Procedure: CARDIOVERSION;  Surgeon: Lelon Perla, MD;  Location: [facility];  Service: Cardiovascular;  Laterality: N/A;   CARDIOVERSION N/A 01/11/2020   Procedure:  CARDIOVERSION;  Surgeon: Josue Hector, MD;  Location: [facility];  Service: Cardiovascular;  Laterality: N/A;   CARDIOVERSION N/A 07/08/2020   Procedure: CARDIOVERSION;  Surgeon: [name], MD;  Location: [place];  Service: Cardiovascular;  Laterality: N/A;   ENDOSCOPIC MUCOSAL RESECTION N/A 12/09/2019   Procedure: ENDOSCOPIC MUCOSAL RESECTION;  Surgeon: Irving Copas., MD;  Location: [facility];  Service: Gastroenterology;  Laterality: N/A;   ESOPHAGOGASTRODUODENOSCOPY N/A 03/08/2021   Procedure: ESOPHAGOGASTRODUODENOSCOPY (EGD);  Surgeon: Irving Copas., MD;  Location: [facility];  Service: Gastroenterology;  Laterality: N/A;   ESOPHAGOGASTRODUODENOSCOPY (EGD) WITH PROPOFOL N/A 09/02/2019   Procedure: ESOPHAGOGASTRODUODENOSCOPY (EGD) WITH PROPOFOL;  Surgeon: Rush Landmark Telford Nab., MD;  Location: [facility];  Service: Gastroenterology;  Laterality: N/A;   ESOPHAGOGASTRODUODENOSCOPY (EGD) WITH PROPOFOL N/A 12/09/2019   Procedure: ESOPHAGOGASTRODUODENOSCOPY (EGD) WITH PROPOFOL;  Surgeon: Rush Landmark Telford Nab., MD;  Location: [facility];  Service: Gastroenterology;  Laterality: N/A;  EUS N/A 09/02/2019   Procedure: UPPER ENDOSCOPIC ULTRASOUND (EUS) RADIAL;  Surgeon: Irving Copas., MD;  Location: [facility];  Service: Gastroenterology;  Laterality: N/A;  EUS radial/linear   EUS N/A 12/09/2019   Procedure: UPPER ENDOSCOPIC ULTRASOUND (EUS) RADIAL;  Surgeon: Irving Copas., MD;  Location: [facility];  Service: Gastroenterology;  Laterality: N/A;   EUS  12/09/2019   Procedure: UPPER ENDOSCOPIC ULTRASOUND (EUS) LINEAR;  Surgeon: Irving Copas., MD;  Location: [facility];  Service: Gastroenterology;;   EUS N/A 03/08/2021   Procedure: UPPER ENDOSCOPIC ULTRASOUND (EUS) RADIAL;  Surgeon: Irving Copas., MD;  Location: [facility];  Service: Gastroenterology;  Laterality: N/A;   FINE NEEDLE ASPIRATION  09/02/2019    Procedure: FINE NEEDLE ASPIRATION (FNA) LINEAR;  Surgeon: Irving Copas., MD;  Location: [facility];  Service: Gastroenterology;;   FINE NEEDLE ASPIRATION  12/09/2019   Procedure: FINE NEEDLE ASPIRATION (FNA) LINEAR;  Surgeon: Irving Copas., MD;  Location: [facility];  Service: Gastroenterology;;   HEMOSTASIS CLIP PLACEMENT  12/09/2019   Procedure: HEMOSTASIS CLIP PLACEMENT;  Surgeon: Irving Copas., MD;  Location: [facility];  Service: Gastroenterology;;   ORIF ANKLE FRACTURE Right 04/27/2015   Procedure: OPEN REDUCTION INTERNAL FIXATION (ORIF) RIGHT BIMALLEOLAR ANKLE FRACTURE WITH SYNDESMOSIS FIXATION;  Surgeon: Leandrew Koyanagi, MD;  Location: [facility];  Service: Orthopedics;  Laterality: Right;   SUBMUCOSAL LIFTING INJECTION  12/09/2019   Procedure: SUBMUCOSAL LIFTING INJECTION;  Surgeon: Irving Copas., MD;  Location: [facility];  Service: Gastroenterology;;   TEE WITHOUT CARDIOVERSION N/A 08/01/2016   Procedure: TRANSESOPHAGEAL ECHOCARDIOGRAM (TEE);  Surgeon: Lelon Perla, MD;  Location: [facility];  Service: Cardiovascular;  Laterality: N/A;   THYROIDECTOMY  11/24/2013   DR Dalbert Batman   THYROIDECTOMY N/A 11/24/2013   Procedure: TOTAL THYROIDECTOMY;  Surgeon: Adin Hector, MD;  Location: [place];  Service: General;  Laterality: N/A;   TRANSTHORACIC ECHOCARDIOGRAM  01/15/2013   EF 55% TO 65%. PROBABLE MILD HYPOKINESIS OF THE INFERIOR MYOCARDIUM. GRADE 1 DIASTOLIC DYSFUNCTION. TRIAL AR.LA IS MILDLY DILATED.   Social History   Social History Narrative   Right handed    Living  alone.   Immunization History  Administered Date(s) Administered   Fluad Quad(high Dose 65+) 08/22/2019, 09/20/2021   Influenza Nasal 11/25/2013   Influenza, High Dose Seasonal PF 12/30/2014, 10/14/2017, 11/01/2018   Influenza,inj,Quad PF,6+ Mos 11/25/2013   Influenza-Unspecified 12/30/2012, 11/09/2015, 12/24/2016, 09/16/2020   PFIZER(Purple Top)SARS-COV-2 Vaccination  12/29/2019, 01/18/2020, 09/16/2020   Pneumococcal Conjugate-13 12/30/2014, 10/14/2017   Pneumococcal Polysaccharide-23 11/25/2013, 08/22/2019   Zoster Recombinat (Shingrix) 10/14/2017, 01/21/2018   Zoster, Live 12/15/2008     Objective: Vital Signs: BP 122/72 (BP Location: Right Arm, Patient Position: Sitting, Cuff Size: Large)   Pulse (!) 56   Ht 5\' 5"  (1.651 m)   Wt 211 lb 9.6 oz (96 kg)   BMI 35.21 kg/m    Physical Exam Vitals and nursing note reviewed.  Constitutional:      Appearance: She is well-developed.  HENT:     Head: Normocephalic and atraumatic.  Eyes:     Conjunctiva/sclera: Conjunctivae normal.  Cardiovascular:     Rate and Rhythm: Normal rate and regular rhythm.     Heart sounds: Normal heart sounds.  Pulmonary:     Effort: Pulmonary effort is normal.     Breath sounds: Normal breath sounds.  Abdominal:     General: Bowel sounds are normal.     Palpations: Abdomen is soft.  Musculoskeletal:  Cervical back: Normal range of motion.  Skin:    General: Skin is warm and dry.     Capillary Refill: Capillary refill takes less than 2 seconds.  Neurological:     Mental Status: She is alert and oriented to person, place, and time.  Psychiatric:        Behavior: Behavior normal.     Musculoskeletal Exam: C-spine, thoracic spine, lumbar spine have good range of motion.  No midline spinal tenderness or SI joint tenderness.  Shoulder joints, elbow joints, wrist joints, MCPs, PIPs, DIPs have good range of motion with no synovitis.  She was able to make a complete fist bilaterally.  Hip joints have good range of motion with no discomfort.  Knee joints have good range of motion with no warmth or effusion.  Ankle joints have good range of motion with no joint tenderness.  Pedal edema noted bilaterally.  No tenderness over MTP joints.  CDAI Exam: CDAI Score: -- Patient Global: --; Provider Global: -- Swollen: --; Tender: -- Joint Exam 10/18/2021   No joint exam has  been documented for this visit   There is currently no information documented on the homunculus. Go to the Rheumatology activity and complete the homunculus joint exam.  Investigation: No additional findings.  Imaging: No results found.  Recent Labs: Lab Results  Component Value Date   WBC 3.2 (L) 07/14/2021   HGB 15.9 07/14/2021   PLT 240 07/14/2021   NA 143 07/14/2021   K 4.1 07/14/2021   CL 104 07/14/2021   CO2 24 07/14/2021   GLUCOSE 87 07/14/2021   BUN 12 07/14/2021   CREATININE 0.93 07/14/2021   BILITOT 0.5 03/24/2021   ALKPHOS 103 03/24/2021   AST 22 03/24/2021   ALT 16 03/24/2021   PROT 8.3 03/24/2021   ALBUMIN 4.9 (H) 03/24/2021   CALCIUM 9.6 07/14/2021   GFRAA 74 09/16/2020    Speciality Comments: No specialty comments available.  Procedures:  No procedures performed Allergies: Labetalol and Codeine   Assessment / Plan:     Visit Diagnoses: Idiopathic chronic gout of multiple sites without tophus -She has not had any signs or symptoms of a gout flare.  She is clinically doing well taking allopurinol 100 mg 1 tablet by mouth daily.  She has been tolerating allopurinol without any side effects.  Her uric acid was 4.2 on 04/18/2021.  Uric acid level will be rechecked today.  We will also check CBC with differential and CMP with GFR.  She has not been eating any red meat, shellfish, or drinking alcohol.  Discussed the importance of avoiding a high purine diet as well as high fructose corn syrup.  She was given a handout of information regarding the gout diet.  She continues to work on weight loss and has an upcoming appointment with endocrinology due to elevated hemoglobin A1c on 08/18/2021.   Discussed the importance of remaining compliant taking allopurinol as prescribed on a daily basis.  She was advised to notify us if she develops signs or symptoms of a gout flare.  She will follow-up in the office in 4 months.  Hyperuricemia - Allopurinol 100 mg 1 tablet by mouth  daily.  CBC, CMP, and uric acid will be updated today.- Plan: Uric acid, CBC with Differential/Platelet, COMPLETE METABOLIC PANEL WITH GFR  Medication monitoring encounter -CBC, CMP, and uric acid level will be checked today.  Plan: Uric acid, CBC with Differential/Platelet, COMPLETE METABOLIC PANEL WITH GFR  Trigger finger, left ring finger: Resolved.  History of vitamin D deficiency: She is taking vitamin D 1000 units daily.  Other medical conditions are listed as follows:   Elevated hemoglobin A1c: Hemoglobin A1c was 6.4% on 08/18/2021.  She continues to try to work on weight loss and has been trying to watch her sugar intake.  She has an upcoming appointment with endocrinology this month.  History of hyperlipidemia: She remains on Crestor as prescribed.  History of hypertension: Blood pressure was 122/72 today in the office.  She has medically appointment with her cardiologist on 11/01/2021.  History of atrial fibrillation - She underwent a cardiac ablation on 07/28/2021 performed by Dr. Curt Bears.  She has not had any recurrence of atrial fibrillation.  Regular rate and rhythm noted today on examination.  She remains on Eliquis as prescribed.  She has upcoming appointment with her cardiologist on 11/01/2021.  Chronic anticoagulation: She remains on Eliquis as prescribed.  History of hypothyroidism: She remains on Synthroid as prescribed.  History of coronary artery disease  OSA on CPAP   Orders: Orders Placed This Encounter  Procedures   Uric acid   CBC with Differential/Platelet   COMPLETE METABOLIC PANEL WITH GFR   No orders of the defined types were placed in this encounter.     Follow-Up Instructions: Return in about 4 months (around 02/15/2022) for Gout.   Ofilia Neas, PA-C  Note - This record has been created using Dragon software.  Chart creation errors have been sought, but may not always  have been located. Such creation errors do not reflect on  the standard  of medical care.

## 2021-10-18 ENCOUNTER — Other Ambulatory Visit: Payer: Self-pay

## 2021-10-18 ENCOUNTER — Ambulatory Visit: Payer: PPO | Admitting: Physician Assistant

## 2021-10-18 ENCOUNTER — Encounter: Payer: Self-pay | Admitting: Physician Assistant

## 2021-10-18 VITALS — BP 122/72 | HR 56 | Ht 65.0 in | Wt 211.6 lb

## 2021-10-18 DIAGNOSIS — E790 Hyperuricemia without signs of inflammatory arthritis and tophaceous disease: Secondary | ICD-10-CM | POA: Diagnosis not present

## 2021-10-18 DIAGNOSIS — M1A09X0 Idiopathic chronic gout, multiple sites, without tophus (tophi): Secondary | ICD-10-CM

## 2021-10-18 DIAGNOSIS — Z8639 Personal history of other endocrine, nutritional and metabolic disease: Secondary | ICD-10-CM

## 2021-10-18 DIAGNOSIS — G4733 Obstructive sleep apnea (adult) (pediatric): Secondary | ICD-10-CM

## 2021-10-18 DIAGNOSIS — Z7901 Long term (current) use of anticoagulants: Secondary | ICD-10-CM

## 2021-10-18 DIAGNOSIS — Z9989 Dependence on other enabling machines and devices: Secondary | ICD-10-CM

## 2021-10-18 DIAGNOSIS — M65342 Trigger finger, left ring finger: Secondary | ICD-10-CM

## 2021-10-18 DIAGNOSIS — R7309 Other abnormal glucose: Secondary | ICD-10-CM

## 2021-10-18 DIAGNOSIS — Z8679 Personal history of other diseases of the circulatory system: Secondary | ICD-10-CM | POA: Diagnosis not present

## 2021-10-18 DIAGNOSIS — Z5181 Encounter for therapeutic drug level monitoring: Secondary | ICD-10-CM | POA: Diagnosis not present

## 2021-10-18 NOTE — Patient Instructions (Signed)

## 2021-10-19 LAB — COMPLETE METABOLIC PANEL WITH GFR
AG Ratio: 1.3 (calc) (ref 1.0–2.5)
ALT: 11 U/L (ref 6–29)
AST: 17 U/L (ref 10–35)
Albumin: 4.4 g/dL (ref 3.6–5.1)
Alkaline phosphatase (APISO): 87 U/L (ref 37–153)
BUN: 18 mg/dL (ref 7–25)
CO2: 29 mmol/L (ref 20–32)
Calcium: 11 mg/dL — ABNORMAL HIGH (ref 8.6–10.4)
Chloride: 103 mmol/L (ref 98–110)
Creat: 0.91 mg/dL (ref 0.60–1.00)
Globulin: 3.3 g/dL (calc) (ref 1.9–3.7)
Glucose, Bld: 81 mg/dL (ref 65–99)
Potassium: 4.1 mmol/L (ref 3.5–5.3)
Sodium: 144 mmol/L (ref 135–146)
Total Bilirubin: 0.6 mg/dL (ref 0.2–1.2)
Total Protein: 7.7 g/dL (ref 6.1–8.1)
eGFR: 67 mL/min/{1.73_m2} (ref >=60)

## 2021-10-19 LAB — CBC WITH DIFFERENTIAL/PLATELET
Absolute Monocytes: 437 cells/uL (ref 200–950)
Basophils Absolute: 19 cells/uL (ref 0–200)
Basophils Relative: 0.5 %
Eosinophils Absolute: 179 cells/uL (ref 15–500)
Eosinophils Relative: 4.7 %
HCT: 48.6 % — ABNORMAL HIGH (ref 35.0–45.0)
Hemoglobin: 15.7 g/dL — ABNORMAL HIGH (ref 11.7–15.5)
Lymphs Abs: 1220 cells/uL (ref 850–3900)
MCH: 30.7 pg (ref 27.0–33.0)
MCHC: 32.3 g/dL (ref 32.0–36.0)
MCV: 95.1 fL (ref 80.0–100.0)
MPV: 9.7 fL (ref 7.5–12.5)
Monocytes Relative: 11.5 %
Neutro Abs: 1946 cells/uL (ref 1500–7800)
Neutrophils Relative %: 51.2 %
Platelets: 241 10*3/uL (ref 140–400)
RBC: 5.11 10*6/uL — ABNORMAL HIGH (ref 3.80–5.10)
RDW: 14.2 % (ref 11.0–15.0)
Total Lymphocyte: 32.1 %
WBC: 3.8 10*3/uL (ref 3.8–10.8)

## 2021-10-19 LAB — URIC ACID: Uric Acid, Serum: 3.7 mg/dL (ref 2.5–7.0)

## 2021-10-19 NOTE — Progress Notes (Signed)
Uric acid is within the desirable range.  Continue on current dose of allopurinol as prescribed.   CBC stable. Calcium is elevated.  Please clarify if she is taking a calcium or vitamin D supplement.  She should hold off on supplementation and excessive dietary intake at this time.   She has an upcoming appointment with endocrinology so I would recommend repeating calcium at that time.

## 2021-10-23 ENCOUNTER — Other Ambulatory Visit: Payer: Self-pay | Admitting: Cardiovascular Disease

## 2021-10-25 ENCOUNTER — Encounter: Payer: Self-pay | Admitting: *Deleted

## 2021-10-30 ENCOUNTER — Other Ambulatory Visit: Payer: Self-pay

## 2021-10-30 ENCOUNTER — Encounter: Payer: Self-pay | Admitting: Internal Medicine

## 2021-10-30 ENCOUNTER — Ambulatory Visit (INDEPENDENT_AMBULATORY_CARE_PROVIDER_SITE_OTHER): Payer: PPO | Admitting: Internal Medicine

## 2021-10-30 ENCOUNTER — Telehealth: Payer: Self-pay | Admitting: Internal Medicine

## 2021-10-30 VITALS — BP 140/82 | HR 80 | Ht 65.0 in | Wt 211.0 lb

## 2021-10-30 DIAGNOSIS — E890 Postprocedural hypothyroidism: Secondary | ICD-10-CM | POA: Diagnosis not present

## 2021-10-30 DIAGNOSIS — R7303 Prediabetes: Secondary | ICD-10-CM | POA: Diagnosis not present

## 2021-10-30 DIAGNOSIS — E039 Hypothyroidism, unspecified: Secondary | ICD-10-CM | POA: Insufficient documentation

## 2021-10-30 LAB — TSH: TSH: 2.68 u[IU]/mL (ref 0.35–5.50)

## 2021-10-30 LAB — T4, FREE: Free T4: 0.97 ng/dL (ref 0.60–1.60)

## 2021-10-30 MED ORDER — SYNTHROID 88 MCG PO TABS: 88.0000 ug | ORAL_TABLET | Freq: Every day | ORAL | 3 refills | Status: DC

## 2021-10-30 NOTE — Patient Instructions (Addendum)
You are on Synthroid - which is your thyroid hormone supplement. You MUST take this consistently.  You should take this first thing in the morning on an empty stomach with water. You should not take it with other medications. Wait 8min to 1hr prior to eating. If you are taking any vitamins - please take these 4 hours from Synthroid .   If you miss a dose, please take your missed dose the following day (double the dose for that day). You should have a pill box for ONLY Synthroid on your bedside table to help you remember to take your medications.   Please HOLD Biotin 2-3 days before any future thyroid checks

## 2021-10-30 NOTE — Telephone Encounter (Signed)
Vm left for patient to call back.

## 2021-10-30 NOTE — Progress Notes (Signed)
Name: Claudia Cantrell  MRN/ DOB: [id number], 1946/07/18    Age/ Sex: 75 y.o., female    PCP: Davy Pique   Reason for Endocrinology Evaluation: Hypothyroidism     Date of Initial Endocrinology Evaluation: 10/30/2021     HPI: [name] is a 75 y.o. female with a past medical history of postoperative hypothyroidism, HTN, dyslipidemia and A.Fib . The patient presented for initial endocrinology clinic visit on 10/30/2021 for consultative assistance with her Hypothyroidism.   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     She follows with rheumatology for gout Follows with cardiology for A.Fib , S/P ablation  Follows with neurology for cervical dystonia    Weight continues to fluctuate  Saw nephrology for proteinuria, LE edema  improving   Denies prior exposure to radiation   Over the years her TSH continues to fluctuate   She is not on Biotin  She had hair loss after thyroidectomy , has been using hot oil treatment which helped   Denies constipation or diarrhea    Of note, she has pre-diabetes  , she has been working on low carb diet   HISTORY:  Past Medical History:  Past Medical History:  Diagnosis Date   Arthritis    rheumatoid   CAD (coronary artery disease)    a. 1992 s/p MI and PTCA of unknown vessel;  b. 09/2010 Cath: LM nl, LAD 20p, LCX 44m, RCA 47m, RPL 20.   Cervical cancer ([place]) 1977   Chronic kidney disease    Family history of anesthesia complication    daughter has difficulty waking    GERD (gastroesophageal reflux disease)    occ   Gout 10/08/2016   Hyperlipidemia    Hypertensive heart disease    Hypothyroidism    Nonischemic cardiomyopathy ([place])    a. 07/2016 Echo: EF 40-45%, mild LVH, inferior akinesis, moderately dilated left atrium, trivial AI and MR.   PAF (paroxysmal atrial fibrillation) ([place])    a. 07/2016 Admitted w/ AF RVR-->CHA2DS2VASc = 5-->Eliquis;  b. 07/2016 successful TEE/DCCV.    Sleep apnea    a. Using CPAP.   Past Surgical History:  Past Surgical History:  Procedure Laterality Date   ABDOMINAL HYSTERECTOMY  1988   ATRIAL FIBRILLATION ABLATION N/A 07/28/2021   Procedure: ATRIAL FIBRILLATION ABLATION;  Surgeon: Constance Haw, MD;  Location: [facility];  Service: Cardiovascular;  Laterality: N/A;   [place]   BIOPSY  09/02/2019   Procedure: BIOPSY;  Surgeon: Rush Landmark Telford Nab., MD;  Location: [facility];  Service: Gastroenterology;;   BIOPSY  12/09/2019   Procedure: BIOPSY;  Surgeon: Irving Copas., MD;  Location: [facility];  Service: Gastroenterology;;   BIOPSY  03/08/2021   Procedure: BIOPSY;  Surgeon: Irving Copas., MD;  Location: [facility];  Service: Gastroenterology;;   [place]   PTCA BY DR [place] N/A 08/01/2016   Procedure: CARDIOVERSION;  Surgeon: Lelon Perla, MD;  Location: [facility];  Service: Cardiovascular;  Laterality: N/A;   CARDIOVERSION N/A 01/11/2020   Procedure: CARDIOVERSION;  Surgeon: Josue Hector, MD;  Location: [place];  Service: Cardiovascular;  Laterality: N/A;   CARDIOVERSION N/A 07/08/2020   Procedure: CARDIOVERSION;  Surgeon: [name], MD;  Location: [place];  Service: Cardiovascular;  Laterality: N/A;   ENDOSCOPIC MUCOSAL RESECTION N/A 12/09/2019   Procedure: ENDOSCOPIC MUCOSAL RESECTION;  Surgeon:  Mansouraty, Telford Nab., MD;  Location: [facility];  Service: Gastroenterology;  Laterality: N/A;   ESOPHAGOGASTRODUODENOSCOPY N/A 03/08/2021   Procedure: ESOPHAGOGASTRODUODENOSCOPY (EGD);  Surgeon: Irving Copas., MD;  Location: [facility];  Service: Gastroenterology;  Laterality: N/A;   ESOPHAGOGASTRODUODENOSCOPY (EGD) WITH PROPOFOL N/A 09/02/2019   Procedure: ESOPHAGOGASTRODUODENOSCOPY (EGD) WITH PROPOFOL;  Surgeon: Rush Landmark Telford Nab., MD;  Location: [facility];  Service:  Gastroenterology;  Laterality: N/A;   ESOPHAGOGASTRODUODENOSCOPY (EGD) WITH PROPOFOL N/A 12/09/2019   Procedure: ESOPHAGOGASTRODUODENOSCOPY (EGD) WITH PROPOFOL;  Surgeon: Rush Landmark Telford Nab., MD;  Location: [facility];  Service: Gastroenterology;  Laterality: N/A;   EUS N/A 09/02/2019   Procedure: UPPER ENDOSCOPIC ULTRASOUND (EUS) RADIAL;  Surgeon: Irving Copas., MD;  Location: [facility];  Service: Gastroenterology;  Laterality: N/A;  EUS radial/linear   EUS N/A 12/09/2019   Procedure: UPPER ENDOSCOPIC ULTRASOUND (EUS) RADIAL;  Surgeon: Irving Copas., MD;  Location: [facility];  Service: Gastroenterology;  Laterality: N/A;   EUS  12/09/2019   Procedure: UPPER ENDOSCOPIC ULTRASOUND (EUS) LINEAR;  Surgeon: Irving Copas., MD;  Location: [facility];  Service: Gastroenterology;;   EUS N/A 03/08/2021   Procedure: UPPER ENDOSCOPIC ULTRASOUND (EUS) RADIAL;  Surgeon: Irving Copas., MD;  Location: [facility];  Service: Gastroenterology;  Laterality: N/A;   FINE NEEDLE ASPIRATION  09/02/2019   Procedure: FINE NEEDLE ASPIRATION (FNA) LINEAR;  Surgeon: Irving Copas., MD;  Location: [facility];  Service: Gastroenterology;;   FINE NEEDLE ASPIRATION  12/09/2019   Procedure: FINE NEEDLE ASPIRATION (FNA) LINEAR;  Surgeon: Irving Copas., MD;  Location: [facility];  Service: Gastroenterology;;   HEMOSTASIS CLIP PLACEMENT  12/09/2019   Procedure: HEMOSTASIS CLIP PLACEMENT;  Surgeon: Irving Copas., MD;  Location: [facility];  Service: Gastroenterology;;   ORIF ANKLE FRACTURE Right 04/27/2015   Procedure: OPEN REDUCTION INTERNAL FIXATION (ORIF) RIGHT BIMALLEOLAR ANKLE FRACTURE WITH SYNDESMOSIS FIXATION;  Surgeon: Leandrew Koyanagi, MD;  Location: [place];  Service: Orthopedics;  Laterality: Right;   SUBMUCOSAL LIFTING INJECTION  12/09/2019   Procedure: SUBMUCOSAL LIFTING INJECTION;  Surgeon: Irving Copas., MD;  Location: [facility];  Service: Gastroenterology;;   TEE WITHOUT CARDIOVERSION N/A 08/01/2016   Procedure: TRANSESOPHAGEAL ECHOCARDIOGRAM (TEE);  Surgeon: Lelon Perla, MD;  Location: [facility];  Service: Cardiovascular;  Laterality: N/A;   THYROIDECTOMY  11/24/2013   DR Dalbert Batman   THYROIDECTOMY N/A 11/24/2013   Procedure: TOTAL THYROIDECTOMY;  Surgeon: Adin Hector, MD;  Location: [place];  Service: General;  Laterality: N/A;   TRANSTHORACIC ECHOCARDIOGRAM  01/15/2013   EF 55% TO 65%. PROBABLE MILD HYPOKINESIS OF THE INFERIOR MYOCARDIUM. GRADE 1 DIASTOLIC DYSFUNCTION. TRIAL AR.LA IS MILDLY DILATED.    Social History:  reports that she quit smoking about 30 years ago. Her smoking use included cigarettes. She has a 15.00 pack-year smoking history. She has never used smokeless tobacco. She reports current alcohol use. She reports that she does not use drugs. Family History: family history includes Bone cancer in her sister; Diabetes in her father; Heart disease in her brother and mother; Melanoma in her brother; Stroke in her father; Sudden death in her brother and mother.   HOME MEDICATIONS: Allergies as of 10/30/2021       Reactions   Labetalol    headaches   Codeine Anxiety        Medication List        Accurate as of October 30, 2021 10:05 AM. If you have any questions, ask your nurse or doctor.  acetaminophen 650 MG CR tablet Commonly known as: TYLENOL Take 1,300 mg by mouth every 8 (eight) hours as needed for pain.   allopurinol 100 MG tablet Commonly known as: ZYLOPRIM Take 100 mg by mouth in the morning.   amLODipine 5 MG tablet Commonly known as: NORVASC TAKE 1 & 1/2 (ONE & ONE-HALF) TABLETS BY MOUTH IN THE EVENING   carvedilol 25 MG tablet Commonly known as: COREG Take 1 tablet (25 mg total) by mouth 2 (two) times daily with a meal.   Eliquis 5 MG Tabs tablet Generic drug: apixaban Take 1 tablet (5 mg total) by mouth 2 (two) times daily.   Farxiga 10  MG Tabs tablet Generic drug: dapagliflozin propanediol Take 10 mg by mouth in the morning.   hydrALAZINE 10 MG tablet Commonly known as: APRESOLINE Take 1 tablet (10 mg total) by mouth 2 (two) times daily.   hydrochlorothiazide 12.5 MG capsule Commonly known as: MICROZIDE Take 1 capsule by mouth in the morning What changed: when to take this   isosorbide dinitrate 5 MG tablet Commonly known as: ISORDIL Take 1 tablet (5 mg total) by mouth 2 (two) times daily.   Magnesium 400 MG Tabs Take 400 mg by mouth every evening.   multivitamin with minerals tablet Take 1 tablet by mouth 3 (three) times a week.   olmesartan 40 MG tablet Commonly known as: BENICAR Take 40 mg by mouth in the morning.   PROBIOTIC PO Take 1 capsule by mouth at bedtime.   rosuvastatin 40 MG tablet Commonly known as: CRESTOR Take 1 tablet by mouth once daily   Synthroid 88 MCG tablet Generic drug: levothyroxine Take 1 tablet by mouth once daily What changed:  how much to take when to take this   Vitamin D3 25 MCG tablet Commonly known as: Vitamin D Take 1,000 Units by mouth daily in the afternoon.   zinc gluconate 50 MG tablet Take 50 mg by mouth daily in the afternoon.          REVIEW OF SYSTEMS: A comprehensive ROS was conducted with the patient and is negative except as per HPI    OBJECTIVE:  VS: BP 140/82 (BP Location: Right Arm, Patient Position: Sitting, Cuff Size: Small)   Pulse 80   Ht 5\' 5"  (1.651 m)   Wt 211 lb (95.7 kg)   BMI 35.11 kg/m    Wt Readings from Last 3 Encounters:  10/30/21 211 lb (95.7 kg)  10/18/21 211 lb 9.6 oz (96 kg)  08/29/21 214 lb 6.4 oz (97.3 kg)     EXAM: General: Pt appears well and is in NAD  Neck: General: Supple without adenopathy. Thyroid: Thyroid size normal.  No goiter or nodules appreciated.  Lungs: Clear with good BS bilat with no rales, rhonchi, or wheezes  Heart: Auscultation: RRR.  Abdomen: Normoactive bowel sounds, soft,  nontender, without masses or organomegaly palpable  Extremities:  BL LE: No pretibial edema normal ROM and strength.  Skin: Hair: Texture and amount normal with gender appropriate distribution Skin Inspection: No rashes Skin Palpation: Skin temperature, texture, and thickness normal to palpation  Mental Status: Judgment, insight: Intact Orientation: Oriented to time, place, and person Mood and affect: No depression, anxiety, or agitation     DATA REVIEWED:     ASSESSMENT/PLAN/RECOMMENDATIONS:   Postoperative Hypothyroidism    - Pt is  clinically euthyroid  - No local neck symptoms - - Pt educated extensively on the correct way to take levothyroxine (first thing in the  morning with water, 30 minutes before eating or taking other medications). - Pt encouraged to double dose the following day if she were to miss a dose given long half-life of levothyroxine.  Medications : Continue Synthroid 88 mcg daily     2. Pre-diabetes :  - We had a discussion about her high risk for developing diabetes, he last A1c 6.4 % , she apparently had A1c checked through rheumatology and it was lower, these records are not available  - We discussed avoiding sugar- sweetened beverages, portion control of fruits   F/U 3 months   Signed electronically by: Mack Guise, MD  [facility] Endocrinology  [facility] [address] [place], [place] [address] Phone: [phone] FAX: [phone]   CC: Davy Pique No address on file Phone: None Fax: None   Return to Endocrinology clinic as below: Future Appointments  Date Time Provider [place]  11/01/2021  3:00 PM Constance Haw, MD [facility]  01/29/2022  8:00 AM Truitt Merle, MD [facility] None  02/05/2022  8:50 AM Maisen Schmit, Melanie Crazier, MD [facility] None  02/14/2022  2:45 PM Bo Merino, MD [facility] None

## 2021-10-30 NOTE — Telephone Encounter (Signed)
Please let the pt know that her thyroid is normal and to continue on current dose of synthroid 88 mcg daily     Thanks

## 2021-10-30 NOTE — Telephone Encounter (Signed)
Patient has been notified and verbalized understanding 

## 2021-10-31 DIAGNOSIS — N182 Chronic kidney disease, stage 2 (mild): Secondary | ICD-10-CM | POA: Diagnosis not present

## 2021-10-31 DIAGNOSIS — R809 Proteinuria, unspecified: Secondary | ICD-10-CM | POA: Diagnosis not present

## 2021-10-31 DIAGNOSIS — E559 Vitamin D deficiency, unspecified: Secondary | ICD-10-CM | POA: Diagnosis not present

## 2021-10-31 DIAGNOSIS — R600 Localized edema: Secondary | ICD-10-CM | POA: Diagnosis not present

## 2021-10-31 DIAGNOSIS — I480 Paroxysmal atrial fibrillation: Secondary | ICD-10-CM | POA: Diagnosis not present

## 2021-10-31 DIAGNOSIS — K319 Disease of stomach and duodenum, unspecified: Secondary | ICD-10-CM | POA: Diagnosis not present

## 2021-10-31 DIAGNOSIS — I129 Hypertensive chronic kidney disease with stage 1 through stage 4 chronic kidney disease, or unspecified chronic kidney disease: Secondary | ICD-10-CM | POA: Diagnosis not present

## 2021-11-01 ENCOUNTER — Other Ambulatory Visit: Payer: Self-pay

## 2021-11-01 ENCOUNTER — Encounter: Payer: Self-pay | Admitting: Cardiology

## 2021-11-01 ENCOUNTER — Ambulatory Visit: Payer: PPO | Admitting: Cardiology

## 2021-11-01 VITALS — BP 110/70 | HR 62 | Ht 65.0 in | Wt 212.8 lb

## 2021-11-01 DIAGNOSIS — I4819 Other persistent atrial fibrillation: Secondary | ICD-10-CM | POA: Diagnosis not present

## 2021-11-01 MED ORDER — CARVEDILOL 12.5 MG PO TABS: 12.5000 mg | ORAL_TABLET | Freq: Two times a day (BID) | ORAL | 3 refills | Status: DC

## 2021-11-01 NOTE — Progress Notes (Signed)
Electrophysiology Office Note   Date:  11/01/2021   ID:  Claudia Cantrell, DOB [birth year], MRN [id number]  PCP:  Girtha Rm, PA-C  Cardiologist:  Claiborne Billings Primary Electrophysiologist:  Kristia Jupiter Meredith Leeds, MD    Chief Complaint: atrial fibrillation   History of Present Illness: Claudia Cantrell is a 75 y.o. female who is being seen today for the evaluation of atrial fibrillation at the request of Girtha Rm, PA-C. Presenting today for electrophysiology evaluation.  She has a history significant for coronary artery disease, CKD, hypertension, hyperlipidemia, nonischemic cardiomyopathy, atrial fibrillation. She has a history of myocardial infarction.  She also has sleep apnea on CPAP.  She developed atrial fibrillation in 2017.  She had a TEE cardioversion at that time.  She was started on Eliquis.  She unfortunately continued to have episodes of atrial fibrillation and is now status post ablation 07/28/2021.    Today, denies symptoms of palpitations, chest pain, shortness of breath, orthopnea, PND, lower extremity edema, claudication, dizziness, presyncope, syncope, bleeding, or neurologic sequela. The patient is tolerating medications without difficulties.  Since her ablation she has done well.  He has noted no further episodes of atrial fibrillation.  She is overall quite happy with her control.  She has essentially cut out all caffeine.  She is worried about going back into atrial fibrillation.  Despite that, she has had no palpitations or no hints of arrhythmia.   Past Medical History:  Diagnosis Date   Arthritis    rheumatoid   CAD (coronary artery disease)    a. 1992 s/p MI and PTCA of unknown vessel;  b. 09/2010 Cath: LM nl, LAD 20p, LCX 84m, RCA 38m, RPL 20.   Cervical cancer ([place]) 1977   Chronic kidney disease    Family history of anesthesia complication    daughter has difficulty waking    GERD (gastroesophageal reflux disease)    occ   Gout 10/08/2016    Hyperlipidemia    Hypertensive heart disease    Hypothyroidism    Nonischemic cardiomyopathy ([place])    a. 07/2016 Echo: EF 40-45%, mild LVH, inferior akinesis, moderately dilated left atrium, trivial AI and MR.   PAF (paroxysmal atrial fibrillation) ([place])    a. 07/2016 Admitted w/ AF RVR-->CHA2DS2VASc = 5-->Eliquis;  b. 07/2016 successful TEE/DCCV.   Sleep apnea    a. Using CPAP.   Past Surgical History:  Procedure Laterality Date   ABDOMINAL HYSTERECTOMY  1988   ATRIAL FIBRILLATION ABLATION N/A 07/28/2021   Procedure: ATRIAL FIBRILLATION ABLATION;  Surgeon: Constance Haw, MD;  Location: [facility];  Service: Cardiovascular;  Laterality: N/A;   [place]   BIOPSY  09/02/2019   Procedure: BIOPSY;  Surgeon: Rush Landmark Telford Nab., MD;  Location: [facility];  Service: Gastroenterology;;   BIOPSY  12/09/2019   Procedure: BIOPSY;  Surgeon: Irving Copas., MD;  Location: [facility];  Service: Gastroenterology;;   BIOPSY  03/08/2021   Procedure: BIOPSY;  Surgeon: Irving Copas., MD;  Location: [facility];  Service: Gastroenterology;;   [place]   PTCA BY DR [place] N/A 08/01/2016   Procedure: CARDIOVERSION;  Surgeon: Lelon Perla, MD;  Location: [facility];  Service: Cardiovascular;  Laterality: N/A;   CARDIOVERSION N/A 01/11/2020   Procedure: CARDIOVERSION;  Surgeon: Josue Hector, MD;  Location: [facility] ENDOSCOPY;  Service: Cardiovascular;  Laterality: N/A;   CARDIOVERSION N/A 07/08/2020   Procedure: CARDIOVERSION;  Surgeon:  [name], MD;  Location: [facility];  Service: Cardiovascular;  Laterality: N/A;   ENDOSCOPIC MUCOSAL RESECTION N/A 12/09/2019   Procedure: ENDOSCOPIC MUCOSAL RESECTION;  Surgeon: Rush Landmark Telford Nab., MD;  Location: [facility];  Service: Gastroenterology;  Laterality: N/A;   ESOPHAGOGASTRODUODENOSCOPY N/A 03/08/2021   Procedure:  ESOPHAGOGASTRODUODENOSCOPY (EGD);  Surgeon: Irving Copas., MD;  Location: [facility];  Service: Gastroenterology;  Laterality: N/A;   ESOPHAGOGASTRODUODENOSCOPY (EGD) WITH PROPOFOL N/A 09/02/2019   Procedure: ESOPHAGOGASTRODUODENOSCOPY (EGD) WITH PROPOFOL;  Surgeon: Rush Landmark Telford Nab., MD;  Location: [facility];  Service: Gastroenterology;  Laterality: N/A;   ESOPHAGOGASTRODUODENOSCOPY (EGD) WITH PROPOFOL N/A 12/09/2019   Procedure: ESOPHAGOGASTRODUODENOSCOPY (EGD) WITH PROPOFOL;  Surgeon: Rush Landmark Telford Nab., MD;  Location: [facility];  Service: Gastroenterology;  Laterality: N/A;   EUS N/A 09/02/2019   Procedure: UPPER ENDOSCOPIC ULTRASOUND (EUS) RADIAL;  Surgeon: Irving Copas., MD;  Location: [facility];  Service: Gastroenterology;  Laterality: N/A;  EUS radial/linear   EUS N/A 12/09/2019   Procedure: UPPER ENDOSCOPIC ULTRASOUND (EUS) RADIAL;  Surgeon: Irving Copas., MD;  Location: [facility];  Service: Gastroenterology;  Laterality: N/A;   EUS  12/09/2019   Procedure: UPPER ENDOSCOPIC ULTRASOUND (EUS) LINEAR;  Surgeon: Irving Copas., MD;  Location: [facility];  Service: Gastroenterology;;   EUS N/A 03/08/2021   Procedure: UPPER ENDOSCOPIC ULTRASOUND (EUS) RADIAL;  Surgeon: Irving Copas., MD;  Location: [facility];  Service: Gastroenterology;  Laterality: N/A;   FINE NEEDLE ASPIRATION  09/02/2019   Procedure: FINE NEEDLE ASPIRATION (FNA) LINEAR;  Surgeon: Irving Copas., MD;  Location: [facility];  Service: Gastroenterology;;   FINE NEEDLE ASPIRATION  12/09/2019   Procedure: FINE NEEDLE ASPIRATION (FNA) LINEAR;  Surgeon: Irving Copas., MD;  Location: [facility];  Service: Gastroenterology;;   HEMOSTASIS CLIP PLACEMENT  12/09/2019   Procedure: HEMOSTASIS CLIP PLACEMENT;  Surgeon: Irving Copas., MD;  Location: [facility];  Service: Gastroenterology;;   ORIF ANKLE FRACTURE Right 04/27/2015    Procedure: OPEN REDUCTION INTERNAL FIXATION (ORIF) RIGHT BIMALLEOLAR ANKLE FRACTURE WITH SYNDESMOSIS FIXATION;  Surgeon: Leandrew Koyanagi, MD;  Location: [facility];  Service: Orthopedics;  Laterality: Right;   SUBMUCOSAL LIFTING INJECTION  12/09/2019   Procedure: SUBMUCOSAL LIFTING INJECTION;  Surgeon: Irving Copas., MD;  Location: [facility];  Service: Gastroenterology;;   TEE WITHOUT CARDIOVERSION N/A 08/01/2016   Procedure: TRANSESOPHAGEAL ECHOCARDIOGRAM (TEE);  Surgeon: Lelon Perla, MD;  Location: [facility] ENDOSCOPY;  Service: Cardiovascular;  Laterality: N/A;   THYROIDECTOMY  11/24/2013   DR Dalbert Batman   THYROIDECTOMY N/A 11/24/2013   Procedure: TOTAL THYROIDECTOMY;  Surgeon: Adin Hector, MD;  Location: [place];  Service: General;  Laterality: N/A;   TRANSTHORACIC ECHOCARDIOGRAM  01/15/2013   EF 55% TO 65%. PROBABLE MILD HYPOKINESIS OF THE INFERIOR MYOCARDIUM. GRADE 1 DIASTOLIC DYSFUNCTION. TRIAL AR.LA IS MILDLY DILATED.     Current Outpatient Medications  Medication Sig Dispense Refill   acetaminophen (TYLENOL) 650 MG CR tablet Take 1,300 mg by mouth every 8 (eight) hours as needed for pain.      allopurinol (ZYLOPRIM) 100 MG tablet Take 100 mg by mouth in the morning.     apixaban (ELIQUIS) 5 MG TABS tablet Take 1 tablet (5 mg total) by mouth 2 (two) times daily. 180 tablet 1   carvedilol (COREG) 25 MG tablet Take 1 tablet (25 mg total) by mouth 2 (two) times daily with a meal. 180 tablet 3   FARXIGA 10 MG TABS tablet Take 10 mg by mouth  in the morning.     furosemide (LASIX) 20 MG tablet Take 20 mg by mouth daily.     hydrALAZINE (APRESOLINE) 10 MG tablet Take 1 tablet (10 mg total) by mouth 2 (two) times daily. 180 tablet 3   isosorbide dinitrate (ISORDIL) 5 MG tablet Take 1 tablet (5 mg total) by mouth 2 (two) times daily. 180 tablet 3   Magnesium 400 MG TABS Take 400 mg by mouth every evening.     Multiple Vitamins-Minerals (MULTIVITAMIN WITH MINERALS) tablet Take 1 tablet by  mouth 3 (three) times a week.     olmesartan (BENICAR) 40 MG tablet Take 40 mg by mouth in the morning.     Probiotic Product (PROBIOTIC PO) Take 1 capsule by mouth at bedtime.     rosuvastatin (CRESTOR) 40 MG tablet Take 1 tablet by mouth once daily 90 tablet 3   SYNTHROID 88 MCG tablet Take 1 tablet (88 mcg total) by mouth daily before breakfast. 90 tablet 3   zinc gluconate 50 MG tablet Take 50 mg by mouth daily in the afternoon.     amLODipine (NORVASC) 5 MG tablet TAKE 1 & 1/2 (ONE & ONE-HALF) TABLETS BY MOUTH IN THE EVENING (Patient not taking: Reported on 11/01/2021) 135 tablet 0   hydrochlorothiazide (MICROZIDE) 12.5 MG capsule Take 1 capsule by mouth in the morning (Patient not taking: Reported on 11/01/2021) 90 capsule 1   Vitamin D3 (VITAMIN D) 25 MCG tablet Take 1,000 Units by mouth daily in the afternoon. (Patient not taking: Reported on 11/01/2021)     No current facility-administered medications for this visit.    Allergies:   Labetalol and Codeine   Social History:  The patient  reports that she quit smoking about 30 years ago. Her smoking use included cigarettes. She has a 15.00 pack-year smoking history. She has never used smokeless tobacco. She reports current alcohol use. She reports that she does not use drugs.   Family History:  The patient's family history includes Bone cancer in her sister; Diabetes in her father; Heart disease in her brother and mother; Melanoma in her brother; Stroke in her father; Sudden death in her brother and mother.   ROS:  Please see the history of present illness.   Otherwise, review of systems is positive for none.   All other systems are reviewed and negative.   PHYSICAL EXAM: VS:  BP 110/70   Pulse 62   Ht 5\' 5"  (1.651 m)   Wt 212 lb 12.8 oz (96.5 kg)   SpO2 95%   BMI 35.41 kg/m  , BMI Body mass index is 35.41 kg/m. GEN: Well nourished, well developed, in no acute distress  HEENT: normal  Neck: no JVD, carotid bruits, or  masses Cardiac: RRR; no murmurs, rubs, or gallops,no edema  Respiratory:  clear to auscultation bilaterally, normal work of breathing GI: soft, nontender, nondistended, + BS MS: no deformity or atrophy  Skin: warm and dry Neuro:  Strength and sensation are intact Psych: euthymic mood, full affect  EKG:  EKG is ordered today. Personal review of the ekg ordered shows sinus rhythm, rate 62, PVC  Recent Labs: 03/24/2021: Magnesium 2.3 10/18/2021: ALT 11; BUN 18; Creat 0.91; Hemoglobin 15.7; Platelets 241; Potassium 4.1; Sodium 144 10/30/2021: TSH 2.68    Lipid Panel     Component Value Date/Time   CHOL 150 03/24/2021 1113   TRIG 126 03/24/2021 1113   HDL 41 03/24/2021 1113   CHOLHDL 3.7 03/24/2021 1113   CHOLHDL  3.4 09/22/2019 0226   VLDL 19 09/22/2019 0226   LDLCALC 86 03/24/2021 1113     Wt Readings from Last 3 Encounters:  11/01/21 212 lb 12.8 oz (96.5 kg)  10/30/21 211 lb (95.7 kg)  10/18/21 211 lb 9.6 oz (96 kg)      Other studies Reviewed: Additional studies/ records that were reviewed today include: TTE 04/24/21  Review of the above records today demonstrates:   1. Left ventricular ejection fraction, by estimation, is 55 to 60%. The  left ventricle has normal function. The left ventricle has no regional  wall motion abnormalities. There is mild left ventricular hypertrophy.  Left ventricular diastolic parameters  are indeterminate.   2. Right ventricular systolic function is normal. The right ventricular  size is normal.   3. Left atrial size was mildly dilated.   4. The mitral valve is normal in structure. Mild mitral valve  regurgitation. No evidence of mitral stenosis.   5. The aortic valve is normal in structure. Aortic valve regurgitation is  mild. No aortic stenosis is present.   6. The inferior vena cava is normal in size with greater than 50%  respiratory variability, suggesting right atrial pressure of 3 mmHg.    ASSESSMENT AND PLAN:  1.  Persistent  atrial fibrillation: Status post ablation 07/28/2021.  Currently on Eliquis 5 mg twice daily.  CHA2DS2-VASc of at least 5.  Since her ablation she has done well.  She has had no further episodes of atrial fibrillation.  She is overall happy with her control.  She has gone to taking her carvedilol once a day.  Due to that we Byron Peacock decrease to 12.5 mg twice daily.  2.  Coronary artery disease: No current chest pain.  Plan per primary cardiology.  3.  Obstructive sleep apnea: CPAP compliance encouraged   Current medicines are reviewed at length with the patient today.   The patient does not have concerns regarding her medicines.  The following changes were made today: Decrease carvedilol  Labs/ tests ordered today include:  Orders Placed This Encounter  Procedures   EKG 12-Lead       Disposition:   FU with Xin Klawitter 3 months  Signed, Lisett Dirusso Meredith Leeds, MD  11/01/2021 3:38 PM     [address] [place] [address] [phone] (office) [phone] (fax)

## 2021-11-01 NOTE — Patient Instructions (Signed)
Medication Instructions:  Your physician has recommended you make the following change in your medication:  DECREASE Carvedilol to 12.5 mg twice daily  *If you need a refill on your cardiac medications before your next appointment, please call your pharmacy*   Lab Work: None ordered    Testing/Procedures: None ordered   Follow-Up: At [facility], you and your health needs are our priority.  As part of our continuing mission to provide you with exceptional heart care, we have created designated Provider Care Teams.  These Care Teams include your primary Cardiologist (physician) and Advanced Practice Providers (APPs -  Physician Assistants and Nurse Practitioners) who all work together to provide you with the care you need, when you need it.   Your next appointment:   3 month(s)  The format for your next appointment:   In Person  Provider:   Allegra Lai, MD    Thank you for choosing [facility]!!   Trinidad Curet, RN [phone]   Other Instructions

## 2021-11-17 ENCOUNTER — Ambulatory Visit: Payer: PPO | Admitting: Family Medicine

## 2021-11-21 ENCOUNTER — Other Ambulatory Visit: Payer: Self-pay | Admitting: Cardiology

## 2021-11-21 DIAGNOSIS — I4819 Other persistent atrial fibrillation: Secondary | ICD-10-CM

## 2021-11-21 NOTE — Telephone Encounter (Signed)
Eliquis 5mg  refill request received. Patient is 75 years old, weight-96.5kg, Crea-0.91 on 10/18/2021, Diagnosis-Afib, and last seen by Dr. Curt Bears on 11/01/2021. Dose is appropriate based on dosing criteria. Will send in refill to requested pharmacy.

## 2021-11-24 DIAGNOSIS — I119 Hypertensive heart disease without heart failure: Secondary | ICD-10-CM | POA: Diagnosis not present

## 2021-11-24 DIAGNOSIS — I4891 Unspecified atrial fibrillation: Secondary | ICD-10-CM | POA: Diagnosis not present

## 2021-11-24 DIAGNOSIS — Z6835 Body mass index (BMI) 35.0-35.9, adult: Secondary | ICD-10-CM | POA: Diagnosis not present

## 2021-11-24 DIAGNOSIS — Z7901 Long term (current) use of anticoagulants: Secondary | ICD-10-CM | POA: Diagnosis not present

## 2021-11-24 DIAGNOSIS — R7303 Prediabetes: Secondary | ICD-10-CM | POA: Diagnosis not present

## 2021-11-24 DIAGNOSIS — D6869 Other thrombophilia: Secondary | ICD-10-CM | POA: Diagnosis not present

## 2021-11-24 DIAGNOSIS — I739 Peripheral vascular disease, unspecified: Secondary | ICD-10-CM | POA: Diagnosis not present

## 2021-11-24 DIAGNOSIS — E039 Hypothyroidism, unspecified: Secondary | ICD-10-CM | POA: Diagnosis not present

## 2021-11-24 DIAGNOSIS — E785 Hyperlipidemia, unspecified: Secondary | ICD-10-CM | POA: Diagnosis not present

## 2021-11-24 DIAGNOSIS — Z7984 Long term (current) use of oral hypoglycemic drugs: Secondary | ICD-10-CM | POA: Diagnosis not present

## 2021-11-24 DIAGNOSIS — Z79890 Hormone replacement therapy (postmenopausal): Secondary | ICD-10-CM | POA: Diagnosis not present

## 2021-12-12 DIAGNOSIS — N390 Urinary tract infection, site not specified: Secondary | ICD-10-CM | POA: Diagnosis not present

## 2021-12-12 DIAGNOSIS — N183 Chronic kidney disease, stage 3 unspecified: Secondary | ICD-10-CM | POA: Diagnosis not present

## 2021-12-23 ENCOUNTER — Other Ambulatory Visit: Payer: Self-pay | Admitting: Cardiovascular Disease

## 2022-01-08 ENCOUNTER — Ambulatory Visit: Payer: PPO | Admitting: Cardiology

## 2022-01-10 ENCOUNTER — Ambulatory Visit: Payer: PPO | Admitting: Student

## 2022-01-10 ENCOUNTER — Encounter: Payer: Self-pay | Admitting: Student

## 2022-01-10 ENCOUNTER — Other Ambulatory Visit: Payer: Self-pay

## 2022-01-10 VITALS — BP 128/86 | HR 66 | Ht 65.0 in | Wt 210.0 lb

## 2022-01-10 DIAGNOSIS — R600 Localized edema: Secondary | ICD-10-CM | POA: Diagnosis not present

## 2022-01-10 DIAGNOSIS — I4819 Other persistent atrial fibrillation: Secondary | ICD-10-CM

## 2022-01-10 DIAGNOSIS — I2510 Atherosclerotic heart disease of native coronary artery without angina pectoris: Secondary | ICD-10-CM | POA: Diagnosis not present

## 2022-01-10 DIAGNOSIS — K319 Disease of stomach and duodenum, unspecified: Secondary | ICD-10-CM | POA: Diagnosis not present

## 2022-01-10 DIAGNOSIS — M109 Gout, unspecified: Secondary | ICD-10-CM | POA: Diagnosis not present

## 2022-01-10 DIAGNOSIS — Z9989 Dependence on other enabling machines and devices: Secondary | ICD-10-CM

## 2022-01-10 DIAGNOSIS — G4733 Obstructive sleep apnea (adult) (pediatric): Secondary | ICD-10-CM | POA: Diagnosis not present

## 2022-01-10 DIAGNOSIS — R809 Proteinuria, unspecified: Secondary | ICD-10-CM | POA: Diagnosis not present

## 2022-01-10 DIAGNOSIS — N1831 Chronic kidney disease, stage 3a: Secondary | ICD-10-CM | POA: Diagnosis not present

## 2022-01-10 DIAGNOSIS — I129 Hypertensive chronic kidney disease with stage 1 through stage 4 chronic kidney disease, or unspecified chronic kidney disease: Secondary | ICD-10-CM | POA: Diagnosis not present

## 2022-01-10 DIAGNOSIS — E559 Vitamin D deficiency, unspecified: Secondary | ICD-10-CM | POA: Diagnosis not present

## 2022-01-10 DIAGNOSIS — I480 Paroxysmal atrial fibrillation: Secondary | ICD-10-CM | POA: Diagnosis not present

## 2022-01-10 LAB — BASIC METABOLIC PANEL
BUN/Creatinine Ratio: 16 (ref 12–28)
BUN: 18 mg/dL (ref 8–27)
CO2: 22 mmol/L (ref 20–29)
Calcium: 9.7 mg/dL (ref 8.7–10.3)
Chloride: 103 mmol/L (ref 96–106)
Creatinine, Ser: 1.12 mg/dL — ABNORMAL HIGH (ref 0.57–1.00)
Glucose: 86 mg/dL (ref 70–99)
Potassium: 4 mmol/L (ref 3.5–5.2)
Sodium: 141 mmol/L (ref 134–144)
eGFR: 52 mL/min/{1.73_m2} — ABNORMAL LOW (ref >59)

## 2022-01-10 MED ORDER — LOSARTAN POTASSIUM 25 MG PO TABS: 25.0000 mg | ORAL_TABLET | Freq: Every day | ORAL | 3 refills | Status: DC

## 2022-01-10 NOTE — Patient Instructions (Addendum)
Medication Instructions:  Your physician has recommended you make the following change in your medication:   DISCONTINUE: Olmesartan START: Losartan 25mg  daily at bedtime  *If you need a refill on your cardiac medications before your next appointment, please call your pharmacy*   Lab Work: TODAY: BMET BMET on 01/24/2022 (lab is open 7:30-4:30)  If you have labs (blood work) drawn today and your tests are completely normal, you will receive your results only by: [place] (if you have MyChart) OR A paper copy in the mail If you have any lab test that is abnormal or we need to change your treatment, we will call you to review the results.   Follow-Up: At [facility], you and your health needs are our priority.  As part of our continuing mission to provide you with exceptional heart care, we have created designated Provider Care Teams.  These Care Teams include your primary Cardiologist (physician) and Advanced Practice Providers (APPs -  Physician Assistants and Nurse Practitioners) who all work together to provide you with the care you need, when you need it.  We recommend signing up for the patient portal called "MyChart".  Sign up information is provided on this After Visit Summary.  MyChart is used to connect with patients for Virtual Visits (Telemedicine).  Patients are able to view lab/test results, encounter notes, upcoming appointments, etc.  Non-urgent messages can be sent to your provider as well.   To learn more about what you can do with MyChart, go to [email or web address].    Your next appointment:   6 month(s)  The format for your next appointment:   In Person  Provider:   You may see Will Meredith Leeds, MD or one of the following Advanced Practice Providers on your designated Care Team:   Tommye Standard, [place] Legrand Como "[facility]" [place], [place]

## 2022-01-10 NOTE — Progress Notes (Signed)
PCP:  Marcellina Millin Primary Cardiologist: Shelva Majestic, MD Electrophysiologist: Will Meredith Leeds, MD   Claudia Cantrell is a 76 y.o. female seen today for Will Meredith Leeds, MD for routine electrophysiology followup.  Since last being seen in our clinic the patient reports doing well. She has continued to adjust her BP medications. She has not been taking Olmesartan or hydralazine. She increased her amlodipine. BP at home not topping 130 per her notes.  she denies chest pain, palpitations, dyspnea, PND, orthopnea, nausea, vomiting, dizziness, syncope, edema, weight gain, or early satiety.  Past Medical History:  Diagnosis Date   Arthritis    rheumatoid   CAD (coronary artery disease)    a. 1992 s/p MI and PTCA of unknown vessel;  b. 09/2010 Cath: LM nl, LAD 20p, LCX 74m, RCA 56m, RPL 20.   Cervical cancer ([place]) 1977   Chronic kidney disease    Family history of anesthesia complication    daughter has difficulty waking    GERD (gastroesophageal reflux disease)    occ   Gout 10/08/2016   Hyperlipidemia    Hypertensive heart disease    Hypothyroidism    Nonischemic cardiomyopathy ([place])    a. 07/2016 Echo: EF 40-45%, mild LVH, inferior akinesis, moderately dilated left atrium, trivial AI and MR.   PAF (paroxysmal atrial fibrillation) ([place])    a. 07/2016 Admitted w/ AF RVR-->CHA2DS2VASc = 5-->Eliquis;  b. 07/2016 successful TEE/DCCV.   Sleep apnea    a. Using CPAP.   Past Surgical History:  Procedure Laterality Date   ABDOMINAL HYSTERECTOMY  1988   ATRIAL FIBRILLATION ABLATION N/A 07/28/2021   Procedure: ATRIAL FIBRILLATION ABLATION;  Surgeon: Constance Haw, MD;  Location: [facility];  Service: Cardiovascular;  Laterality: N/A;   [place]   BIOPSY  09/02/2019   Procedure: BIOPSY;  Surgeon: Rush Landmark Telford Nab., MD;  Location: [facility];  Service: Gastroenterology;;   BIOPSY  12/09/2019   Procedure: BIOPSY;  Surgeon: Irving Copas.,  MD;  Location: [facility];  Service: Gastroenterology;;   BIOPSY  03/08/2021   Procedure: BIOPSY;  Surgeon: Irving Copas., MD;  Location: [facility];  Service: Gastroenterology;;   [place]   PTCA BY DR [place] N/A 08/01/2016   Procedure: CARDIOVERSION;  Surgeon: Lelon Perla, MD;  Location: [facility];  Service: Cardiovascular;  Laterality: N/A;   CARDIOVERSION N/A 01/11/2020   Procedure: CARDIOVERSION;  Surgeon: Josue Hector, MD;  Location: [place] [facility];  Service: Cardiovascular;  Laterality: N/A;   CARDIOVERSION N/A 07/08/2020   Procedure: CARDIOVERSION;  Surgeon: [name], MD;  Location: [place];  Service: Cardiovascular;  Laterality: N/A;   ENDOSCOPIC MUCOSAL RESECTION N/A 12/09/2019   Procedure: ENDOSCOPIC MUCOSAL RESECTION;  Surgeon: Irving Copas., MD;  Location: [facility];  Service: Gastroenterology;  Laterality: N/A;   ESOPHAGOGASTRODUODENOSCOPY N/A 03/08/2021   Procedure: ESOPHAGOGASTRODUODENOSCOPY (EGD);  Surgeon: Irving Copas., MD;  Location: [facility];  Service: Gastroenterology;  Laterality: N/A;   ESOPHAGOGASTRODUODENOSCOPY (EGD) WITH PROPOFOL N/A 09/02/2019   Procedure: ESOPHAGOGASTRODUODENOSCOPY (EGD) WITH PROPOFOL;  Surgeon: Rush Landmark Telford Nab., MD;  Location: [facility];  Service: Gastroenterology;  Laterality: N/A;   ESOPHAGOGASTRODUODENOSCOPY (EGD) WITH PROPOFOL N/A 12/09/2019   Procedure: ESOPHAGOGASTRODUODENOSCOPY (EGD) WITH PROPOFOL;  Surgeon: Rush Landmark Telford Nab., MD;  Location: [facility];  Service: Gastroenterology;  Laterality: N/A;   EUS N/A 09/02/2019   Procedure: UPPER ENDOSCOPIC ULTRASOUND (EUS) RADIAL;  Surgeon: Rush Landmark,  Telford Nab., MD;  Location: [facility];  Service: Gastroenterology;  Laterality: N/A;  EUS radial/linear   EUS N/A 12/09/2019   Procedure: UPPER ENDOSCOPIC ULTRASOUND (EUS) RADIAL;  Surgeon: Irving Copas.,  MD;  Location: [facility];  Service: Gastroenterology;  Laterality: N/A;   EUS  12/09/2019   Procedure: UPPER ENDOSCOPIC ULTRASOUND (EUS) LINEAR;  Surgeon: Irving Copas., MD;  Location: [facility];  Service: Gastroenterology;;   EUS N/A 03/08/2021   Procedure: UPPER ENDOSCOPIC ULTRASOUND (EUS) RADIAL;  Surgeon: Irving Copas., MD;  Location: [facility];  Service: Gastroenterology;  Laterality: N/A;   FINE NEEDLE ASPIRATION  09/02/2019   Procedure: FINE NEEDLE ASPIRATION (FNA) LINEAR;  Surgeon: Irving Copas., MD;  Location: [facility];  Service: Gastroenterology;;   FINE NEEDLE ASPIRATION  12/09/2019   Procedure: FINE NEEDLE ASPIRATION (FNA) LINEAR;  Surgeon: Irving Copas., MD;  Location: [facility];  Service: Gastroenterology;;   HEMOSTASIS CLIP PLACEMENT  12/09/2019   Procedure: HEMOSTASIS CLIP PLACEMENT;  Surgeon: Irving Copas., MD;  Location: [facility];  Service: Gastroenterology;;   ORIF ANKLE FRACTURE Right 04/27/2015   Procedure: OPEN REDUCTION INTERNAL FIXATION (ORIF) RIGHT BIMALLEOLAR ANKLE FRACTURE WITH SYNDESMOSIS FIXATION;  Surgeon: Leandrew Koyanagi, MD;  Location: [facility];  Service: Orthopedics;  Laterality: Right;   SUBMUCOSAL LIFTING INJECTION  12/09/2019   Procedure: SUBMUCOSAL LIFTING INJECTION;  Surgeon: Irving Copas., MD;  Location: [facility];  Service: Gastroenterology;;   TEE WITHOUT CARDIOVERSION N/A 08/01/2016   Procedure: TRANSESOPHAGEAL ECHOCARDIOGRAM (TEE);  Surgeon: Lelon Perla, MD;  Location: [facility];  Service: Cardiovascular;  Laterality: N/A;   THYROIDECTOMY  11/24/2013   DR Dalbert Batman   THYROIDECTOMY N/A 11/24/2013   Procedure: TOTAL THYROIDECTOMY;  Surgeon: Adin Hector, MD;  Location: [facility];  Service: General;  Laterality: N/A;   TRANSTHORACIC ECHOCARDIOGRAM  01/15/2013   EF 55% TO 65%. PROBABLE MILD HYPOKINESIS OF THE INFERIOR MYOCARDIUM. GRADE 1 DIASTOLIC DYSFUNCTION. TRIAL AR.LA IS  MILDLY DILATED.    Current Outpatient Medications  Medication Sig Dispense Refill   acetaminophen (TYLENOL) 650 MG CR tablet Take 1,300 mg by mouth every 8 (eight) hours as needed for pain.      allopurinol (ZYLOPRIM) 100 MG tablet Take 100 mg by mouth in the morning.     amLODipine (NORVASC) 5 MG tablet TAKE 1 & 1/2 (ONE & ONE-HALF) TABLETS BY MOUTH IN THE EVENING 135 tablet 1   apixaban (ELIQUIS) 5 MG TABS tablet Take 1 tablet by mouth twice daily 180 tablet 1   carvedilol (COREG) 12.5 MG tablet Take 1 tablet (12.5 mg total) by mouth 2 (two) times daily. 180 tablet 3   FARXIGA 10 MG TABS tablet Take 10 mg by mouth in the morning.     furosemide (LASIX) 20 MG tablet Take 20 mg by mouth daily.     hydrALAZINE (APRESOLINE) 10 MG tablet Take 1 tablet (10 mg total) by mouth 2 (two) times daily. 180 tablet 3   hydrochlorothiazide (MICROZIDE) 12.5 MG capsule Take 1 capsule by mouth in the morning 90 capsule 1   isosorbide dinitrate (ISORDIL) 5 MG tablet Take 1 tablet (5 mg total) by mouth 2 (two) times daily. 180 tablet 3   Magnesium 400 MG TABS Take 400 mg by mouth every evening.     Multiple Vitamins-Minerals (MULTIVITAMIN WITH MINERALS) tablet Take 1 tablet by mouth 3 (three) times a week.     olmesartan (BENICAR) 40 MG tablet Take 40 mg by mouth in the morning.  Probiotic Product (PROBIOTIC PO) Take 1 capsule by mouth at bedtime.     rosuvastatin (CRESTOR) 40 MG tablet Take 1 tablet by mouth once daily 90 tablet 3   SYNTHROID 88 MCG tablet Take 1 tablet (88 mcg total) by mouth daily before breakfast. 90 tablet 3   Vitamin D3 (VITAMIN D) 25 MCG tablet Take 1,000 Units by mouth daily in the afternoon.     zinc gluconate 50 MG tablet Take 50 mg by mouth daily in the afternoon.     No current facility-administered medications for this visit.    Allergies  Allergen Reactions   Labetalol     headaches   Codeine Anxiety    Social History   Socioeconomic History   Marital status:  Widowed    Spouse name: Not on file   Number of children: 3   Years of education: Not on file   Highest education level: Not on file  Occupational History   Occupation: retired  Tobacco Use   Smoking status: Former    Packs/day: 1.00    Years: 15.00    Pack years: 15.00    Types: Cigarettes    Quit date: 12/10/1990    Years since quitting: 31.1   Smokeless tobacco: Never  Vaping Use   Vaping Use: Never used  Substance and Sexual Activity   Alcohol use: Yes    Comment: very rare   Drug use: No   Sexual activity: Not on file  Other Topics Concern   Not on file  Social History Narrative   Right handed    Living  alone.   Social Determinants of Health   Financial Resource Strain: Not on file  Food Insecurity: Not on file  Transportation Needs: Not on file  Physical Activity: Not on file  Stress: Not on file  Social Connections: Not on file  Intimate Partner Violence: Not on file     Review of Systems: All other systems reviewed and are otherwise negative except as noted above.  Physical Exam: Vitals:   01/10/22 0918  BP: 128/86  Pulse: 66  SpO2: 98%  Weight: 210 lb (95.3 kg)  Height: 5\' 5"  (1.651 m)    GEN- The patient is well appearing, alert and oriented x 3 today.   HEENT: normocephalic, atraumatic; sclera clear, conjunctiva pink; hearing intact; oropharynx clear; neck supple, no JVP Lymph- no cervical lymphadenopathy Lungs- Clear to ausculation bilaterally, normal work of breathing.  No wheezes, rales, rhonchi Heart- Regular rate and rhythm, no murmurs, rubs or gallops, PMI not laterally displaced GI- soft, non-tender, non-distended, bowel sounds present, no hepatosplenomegaly Extremities- no clubbing, cyanosis, or edema; DP/PT/radial pulses 2+ bilaterally MS- no significant deformity or atrophy Skin- warm and dry, no rash or lesion Psych- euthymic mood, full affect Neuro- strength and sensation are intact  EKG is not ordered.   Additional studies  reviewed include: Previous EP office notes.   Assessment and Plan:  1.  Persistent atrial fibrillation: Status post ablation 07/28/2021.   Continue Eliquis 5 mg twice daily for CHA2DS2-VASc of at least 5. Doing well post ablation with no further episodes of AF.  Overall happy with control.  Continue coreg 25 mg BID.    2.  Coronary artery disease: Denies s/s ischemia F/u per primary cardiology.  3. OSA Encouraged nightly CPAP  4. HTN Stop Olmesartan Start Losartan 25 mg qhs.  Discussed reno-protective properties of ARBs. OK to stay off hydralazine for now.  Titrate amlodipine down as needed.  Follow up with Dr. Curt Bears in 6 months   Claudia Cantrell, [place]  01/10/22 9:23 AM

## 2022-01-15 ENCOUNTER — Other Ambulatory Visit: Payer: Self-pay

## 2022-01-15 ENCOUNTER — Ambulatory Visit (INDEPENDENT_AMBULATORY_CARE_PROVIDER_SITE_OTHER): Payer: PPO | Admitting: Physician Assistant

## 2022-01-15 ENCOUNTER — Encounter: Payer: Self-pay | Admitting: Physician Assistant

## 2022-01-15 VITALS — BP 120/68 | HR 68 | Ht 64.0 in | Wt 214.2 lb

## 2022-01-15 DIAGNOSIS — R7303 Prediabetes: Secondary | ICD-10-CM | POA: Diagnosis not present

## 2022-01-15 DIAGNOSIS — E039 Hypothyroidism, unspecified: Secondary | ICD-10-CM

## 2022-01-15 DIAGNOSIS — I10 Essential (primary) hypertension: Secondary | ICD-10-CM

## 2022-01-15 LAB — POCT GLYCOSYLATED HEMOGLOBIN (HGB A1C): Hemoglobin A1C: 5.9 % — AB (ref 4.0–5.6)

## 2022-01-15 NOTE — Patient Instructions (Signed)
01/15/2022 hgb a1c 5.9 in office

## 2022-01-15 NOTE — Progress Notes (Signed)
Subjective:  Claudia Cantrell is a 76 y.o. female who presents for Chief Complaint  Patient presents with   Hypertension   Hyperlipidemia   Hyperglycemia    Pre-diabetic        The following portions of the patient's history were reviewed and updated as appropriate: allergies, current medications, past family history, past medical history, past social history, past surgical history and problem list.  ROS Otherwise as in subjective above  Objective: BP 120/68 (BP Location: Left Arm, Patient Position: Sitting)    Pulse 68    Ht 5\' 4"  (1.626 m)    Wt 214 lb 3.2 oz (97.2 kg)    SpO2 98%    BMI 36.77 kg/m   General appearance: alert, no distress, well developed, well nourished HEENT: normocephalic, sclerae anicteric, conjunctiva pink and moist Oral cavity: wearing a mask Neck: supple, no lymphadenopathy, no thyromegaly, no masses Heart: RRR, normal S1, S2, no murmurs Lungs: CTA bilaterally, no wheezes, rhonchi, or rales Abdomen: +bs, soft, non tender, non distended Pulses: 2+ radial pulses, 2+ pedal pulses, normal cap refill Ext: no edema   Assessment: Encounter Diagnoses  Name Primary?   Prediabetes Yes   Essential hypertension    Acquired hypothyroidism      Plan:  Prediabetes - Stable, continue current management, eat a low sugar, low carbohydrate diet, hgb a1c 5.9 today  Hypertensioin - Stable, continue current management, eat a low salt diet  Hypothyroidism - stable, continue to take thyroid medicine 1st thing in the morning on an empty stomach with water, but without food or other medicines or supplements    Claudia Cantrell was seen today for hypertension, hyperlipidemia and hyperglycemia.  Diagnoses and all orders for this visit:  Prediabetes -     HgB A1c  Essential hypertension  Acquired hypothyroidism    Follow up:  in 1 year for an annual exam with labs

## 2022-01-18 ENCOUNTER — Encounter: Payer: Self-pay | Admitting: Physician Assistant

## 2022-01-24 ENCOUNTER — Other Ambulatory Visit: Payer: Self-pay

## 2022-01-24 ENCOUNTER — Other Ambulatory Visit: Payer: PPO | Admitting: *Deleted

## 2022-01-24 DIAGNOSIS — I4819 Other persistent atrial fibrillation: Secondary | ICD-10-CM | POA: Diagnosis not present

## 2022-01-24 DIAGNOSIS — I2510 Atherosclerotic heart disease of native coronary artery without angina pectoris: Secondary | ICD-10-CM | POA: Diagnosis not present

## 2022-01-24 DIAGNOSIS — G4733 Obstructive sleep apnea (adult) (pediatric): Secondary | ICD-10-CM | POA: Diagnosis not present

## 2022-01-24 DIAGNOSIS — Z9989 Dependence on other enabling machines and devices: Secondary | ICD-10-CM | POA: Diagnosis not present

## 2022-01-24 LAB — BASIC METABOLIC PANEL
BUN/Creatinine Ratio: 9 — ABNORMAL LOW (ref 12–28)
BUN: 10 mg/dL (ref 8–27)
CO2: 26 mmol/L (ref 20–29)
Calcium: 9.6 mg/dL (ref 8.7–10.3)
Chloride: 104 mmol/L (ref 96–106)
Creatinine, Ser: 1.13 mg/dL — ABNORMAL HIGH (ref 0.57–1.00)
Glucose: 86 mg/dL (ref 70–99)
Potassium: 3.7 mmol/L (ref 3.5–5.2)
Sodium: 144 mmol/L (ref 134–144)
eGFR: 51 mL/min/{1.73_m2} — ABNORMAL LOW (ref >59)

## 2022-01-27 DIAGNOSIS — C49A2 Gastrointestinal stromal tumor of stomach: Secondary | ICD-10-CM | POA: Insufficient documentation

## 2022-01-29 ENCOUNTER — Inpatient Hospital Stay: Payer: PPO | Attending: Hematology | Admitting: Hematology

## 2022-01-29 ENCOUNTER — Encounter: Payer: Self-pay | Admitting: Hematology

## 2022-01-29 ENCOUNTER — Other Ambulatory Visit: Payer: Self-pay

## 2022-01-29 DIAGNOSIS — C49A2 Gastrointestinal stromal tumor of stomach: Secondary | ICD-10-CM

## 2022-01-29 DIAGNOSIS — B9681 Helicobacter pylori [H. pylori] as the cause of diseases classified elsewhere: Secondary | ICD-10-CM | POA: Insufficient documentation

## 2022-01-29 DIAGNOSIS — Z79899 Other long term (current) drug therapy: Secondary | ICD-10-CM | POA: Diagnosis not present

## 2022-01-29 NOTE — Progress Notes (Signed)
[place]   [phone] [phone]   Clinic Follow up Note   Patient Care Team: Marcellina Millin as PCP - General (Physician Assistant) Troy Sine, MD as PCP - Cardiology (Cardiology) Constance Haw, MD as PCP - Electrophysiology (Cardiology)  Date of Service:  01/29/2022  CHIEF COMPLAINT: f/u of GIST  CURRENT THERAPY:  Surveillance  ASSESSMENT & PLAN:  Claudia Cantrell is a 76 y.o. female with   1. Gastric GIST, cT1N0M0  -incidental finding on CT AP 05/07/19 during work up for proteinuria. EUS by Dr. Rush Landmark on 09/02/19 showed a 9 mm submucosal papule on greater curvature of gastric body. Resection was planned but aborted due to patient developing arrhythmia. Cytology from the mass was nondiagnostic, biopsy was positive for H.pylori. -repeat EUS 12/09/19 showed a 14.4 mm subepithelial lesion in body of stomach; pathology revealed spindle cell neoplasm, consistent with GIST. She went on surveillance.  Plan is to alternate EUS and CT scan every other year  -surveillance EGD/EUS 03/08/21 showed no significant change in size of GIST lesion. -she is clinically doing well.  She is asymptomatic, exam with negative. -we will plan for repeat CT to be done in the next few weeks; I will call her with the results.   PLAN: -CT AP to be done in next 2 weeks  -I will call her with the results -f/u in 2 years -she will see Dr. Rush Landmark next year for EUS surveillance.   No problem-specific Assessment & Plan notes found for this encounter.   INTERVAL HISTORY:  Claudia Cantrell is here for a follow up of GIST. She was last seen by Ned Card, NP on 01/30/21. She presents to the clinic alone. She was seen by my partner Dr. Benay Spice 2 years ago when she was diagnosed with gastric GIST.  She never had a surgery, is currently on surveillance.  She is doing well, denies any abdominal pain, bloating, nausea, or other symptoms.  She has normal appetite  and energy level.   All other systems were reviewed with the patient and are negative.  MEDICAL HISTORY:  Past Medical History:  Diagnosis Date   Arthritis    rheumatoid   CAD (coronary artery disease)    a. 1992 s/p MI and PTCA of unknown vessel;  b. 09/2010 Cath: LM nl, LAD 20p, LCX 17m, RCA 55m, RPL 20.   Cervical cancer ([place]) 1977   Chronic kidney disease    Family history of anesthesia complication    daughter has difficulty waking    GERD (gastroesophageal reflux disease)    occ   Gout 10/08/2016   Hyperlipidemia    Hypertensive heart disease    Hypothyroidism    Nonischemic cardiomyopathy ([place])    a. 07/2016 Echo: EF 40-45%, mild LVH, inferior akinesis, moderately dilated left atrium, trivial AI and MR.   PAF (paroxysmal atrial fibrillation) ([place])    a. 07/2016 Admitted w/ AF RVR-->CHA2DS2VASc = 5-->Eliquis;  b. 07/2016 successful TEE/DCCV.   Sleep apnea    a. Using CPAP.    SURGICAL HISTORY: Past Surgical History:  Procedure Laterality Date   ABDOMINAL HYSTERECTOMY  1988   ATRIAL FIBRILLATION ABLATION N/A 07/28/2021   Procedure: ATRIAL FIBRILLATION ABLATION;  Surgeon: Constance Haw, MD;  Location: [facility];  Service: Cardiovascular;  Laterality: N/A;   [place]   BIOPSY  09/02/2019   Procedure: BIOPSY;  Surgeon: Rush Landmark Telford Nab., MD;  Location: [facility];  Service:  Gastroenterology;;   BIOPSY  12/09/2019   Procedure: BIOPSY;  Surgeon: Irving Copas., MD;  Location: [facility];  Service: Gastroenterology;;   BIOPSY  03/08/2021   Procedure: BIOPSY;  Surgeon: Irving Copas., MD;  Location: [facility];  Service: Gastroenterology;;   [place]   PTCA BY DR Lee N/A 08/01/2016   Procedure: CARDIOVERSION;  Surgeon: Lelon Perla, MD;  Location: [facility] END[facility]COPY;  Service: Cardiovascular;  Laterality: N/A;   CARDIOVERSION N/A 01/11/2020   Procedure: CARDIOVERSION;   Surgeon: Josue Hector, MD;  Location: [facility] ENDOSCOPY;  Service: Cardiovascular;  Laterality: N/A;   CARDIOVERSION N/A 07/08/2020   Procedure: CARDIOVERSION;  Surgeon: [name], MD;  Location: [place];  Service: Cardiovascular;  Laterality: N/A;   ENDOSCOPIC MUCOSAL RESECTION N/A 12/09/2019   Procedure: ENDOSCOPIC MUCOSAL RESECTION;  Surgeon: Irving Copas., MD;  Location: [facility];  Service: Gastroenterology;  Laterality: N/A;   ESOPHAGOGASTRODUODENOSCOPY N/A 03/08/2021   Procedure: ESOPHAGOGASTRODUODENOSCOPY (EGD);  Surgeon: Irving Copas., MD;  Location: [facility];  Service: Gastroenterology;  Laterality: N/A;   ESOPHAGOGASTRODUODENOSCOPY (EGD) WITH PROPOFOL N/A 09/02/2019   Procedure: ESOPHAGOGASTRODUODENOSCOPY (EGD) WITH PROPOFOL;  Surgeon: Rush Landmark Telford Nab., MD;  Location: [facility];  Service: Gastroenterology;  Laterality: N/A;   ESOPHAGOGASTRODUODENOSCOPY (EGD) WITH PROPOFOL N/A 12/09/2019   Procedure: ESOPHAGOGASTRODUODENOSCOPY (EGD) WITH PROPOFOL;  Surgeon: Rush Landmark Telford Nab., MD;  Location: [facility];  Service: Gastroenterology;  Laterality: N/A;   EUS N/A 09/02/2019   Procedure: UPPER ENDOSCOPIC ULTRASOUND (EUS) RADIAL;  Surgeon: Irving Copas., MD;  Location: [facility];  Service: Gastroenterology;  Laterality: N/A;  EUS radial/linear   EUS N/A 12/09/2019   Procedure: UPPER ENDOSCOPIC ULTRASOUND (EUS) RADIAL;  Surgeon: Irving Copas., MD;  Location: [facility];  Service: Gastroenterology;  Laterality: N/A;   EUS  12/09/2019   Procedure: UPPER ENDOSCOPIC ULTRASOUND (EUS) LINEAR;  Surgeon: Irving Copas., MD;  Location: [facility];  Service: Gastroenterology;;   EUS N/A 03/08/2021   Procedure: UPPER ENDOSCOPIC ULTRASOUND (EUS) RADIAL;  Surgeon: Irving Copas., MD;  Location: [facility];  Service: Gastroenterology;  Laterality: N/A;   FINE NEEDLE ASPIRATION  09/02/2019   Procedure: FINE  NEEDLE ASPIRATION (FNA) LINEAR;  Surgeon: Irving Copas., MD;  Location: [facility];  Service: Gastroenterology;;   FINE NEEDLE ASPIRATION  12/09/2019   Procedure: FINE NEEDLE ASPIRATION (FNA) LINEAR;  Surgeon: Irving Copas., MD;  Location: [facility];  Service: Gastroenterology;;   HEMOSTASIS CLIP PLACEMENT  12/09/2019   Procedure: HEMOSTASIS CLIP PLACEMENT;  Surgeon: Irving Copas., MD;  Location: [facility];  Service: Gastroenterology;;   ORIF ANKLE FRACTURE Right 04/27/2015   Procedure: OPEN REDUCTION INTERNAL FIXATION (ORIF) RIGHT BIMALLEOLAR ANKLE FRACTURE WITH SYNDESMOSIS FIXATION;  Surgeon: Leandrew Koyanagi, MD;  Location: [place];  Service: Orthopedics;  Laterality: Right;   SUBMUCOSAL LIFTING INJECTION  12/09/2019   Procedure: SUBMUCOSAL LIFTING INJECTION;  Surgeon: Irving Copas., MD;  Location: [facility];  Service: Gastroenterology;;   TEE WITHOUT CARDIOVERSION N/A 08/01/2016   Procedure: TRANSESOPHAGEAL ECHOCARDIOGRAM (TEE);  Surgeon: Lelon Perla, MD;  Location: [facility] ENDOSCOPY;  Service: Cardiovascular;  Laterality: N/A;   THYROIDECTOMY  11/24/2013   DR Dalbert Batman   THYROIDECTOMY N/A 11/24/2013   Procedure: TOTAL THYROIDECTOMY;  Surgeon: Adin Hector, MD;  Location: [place];  Service: General;  Laterality: N/A;   TRANSTHORACIC ECHOCARDIOGRAM  01/15/2013   EF 55% TO 65%. PROBABLE MILD HYPOKINESIS OF THE INFERIOR MYOCARDIUM. GRADE  1 DIASTOLIC DYSFUNCTION. TRIAL AR.LA IS MILDLY DILATED.    I have reviewed the social history and family history with the patient and they are unchanged from previous note.  ALLERGIES:  is allergic to labetalol and codeine.  MEDICATIONS:  Current Outpatient Medications  Medication Sig Dispense Refill   acetaminophen (TYLENOL) 650 MG CR tablet Take 1,300 mg by mouth every 8 (eight) hours as needed for pain.      allopurinol (ZYLOPRIM) 100 MG tablet Take 100 mg by mouth in the morning. (Patient not taking: Reported  on 01/15/2022)     amLODipine (NORVASC) 5 MG tablet TAKE 1 & 1/2 (ONE & ONE-HALF) TABLETS BY MOUTH IN THE EVENING 135 tablet 1   apixaban (ELIQUIS) 5 MG TABS tablet Take 1 tablet by mouth twice daily 180 tablet 1   carvedilol (COREG) 12.5 MG tablet Take 1 tablet (12.5 mg total) by mouth 2 (two) times daily. 180 tablet 3   FARXIGA 10 MG TABS tablet Take 10 mg by mouth in the morning.     furosemide (LASIX) 20 MG tablet Take 20 mg by mouth daily.     hydrochlorothiazide (MICROZIDE) 12.5 MG capsule Take 1 capsule by mouth in the morning (Patient not taking: Reported on 01/15/2022) 90 capsule 1   isosorbide dinitrate (ISORDIL) 5 MG tablet Take 1 tablet (5 mg total) by mouth 2 (two) times daily. 180 tablet 3   losartan (COZAAR) 25 MG tablet Take 1 tablet (25 mg total) by mouth at bedtime. 90 tablet 3   Magnesium 400 MG TABS Take 400 mg by mouth every evening.     Multiple Vitamins-Minerals (MULTIVITAMIN WITH MINERALS) tablet Take 1 tablet by mouth 3 (three) times a week.     PFIZER COVID-19 VAC BIVALENT injection      Probiotic Product (PROBIOTIC PO) Take 1 capsule by mouth at bedtime.     rosuvastatin (CRESTOR) 40 MG tablet Take 1 tablet by mouth once daily 90 tablet 3   SYNTHROID 88 MCG tablet Take 1 tablet (88 mcg total) by mouth daily before breakfast. 90 tablet 3   Vitamin D3 (VITAMIN D) 25 MCG tablet Take 1,000 Units by mouth daily in the afternoon.     zinc gluconate 50 MG tablet Take 50 mg by mouth daily in the afternoon.     No current facility-administered medications for this visit.    PHYSICAL EXAMINATION: ECOG PERFORMANCE STATUS: 0 - Asymptomatic  Vitals:   01/29/22 0813  BP: (!) 145/97  Pulse: 82  Resp: 20  Temp: 98.8 F (37.1 C)  SpO2: 98%   Wt Readings from Last 3 Encounters:  01/29/22 211 lb 14.4 oz (96.1 kg)  01/15/22 214 lb 3.2 oz (97.2 kg)  01/10/22 210 lb (95.3 kg)     GENERAL:alert, no distress and comfortable SKIN: skin color, texture, turgor are normal, no  rashes or significant lesions EYES: normal, Conjunctiva are pink and non-injected, sclera clear NECK: supple, thyroid normal size, non-tender, without nodularity LYMPH:  no palpable lymphadenopathy in the cervical, axillary  LUNGS: clear to auscultation and percussion with normal breathing effort HEART: regular rate & rhythm and no murmurs and no lower extremity edema ABDOMEN:abdomen soft, non-tender and normal bowel sounds Musculoskeletal:no cyanosis of digits and no clubbing  NEURO: alert & oriented x 3 with fluent speech, no focal motor/sensory deficits  LABORATORY DATA:  I have reviewed the data as listed CBC Latest Ref Rng & Units 10/18/2021 07/14/2021 03/24/2021  WBC 3.8 - 10.8 Thousand/uL 3.8 3.2(L) 3.4  Hemoglobin 11.7 - 15.5 g/dL 15.7(H) 15.9 16.6(H)  Hematocrit 35.0 - 45.0 % 48.6(H) 50.1(H) 51.8(H)  Platelets 140 - 400 Thousand/uL 241 240 206     CMP Latest Ref Rng & Units 01/24/2022 01/10/2022 10/18/2021  Glucose 70 - 99 mg/dL 86 86 81  BUN 8 - 27 mg/dL 10 18 18   Creatinine 0.57 - 1.00 mg/dL 1.13(H) 1.12(H) 0.91  Sodium 134 - 144 mmol/L 144 141 144  Potassium 3.5 - 5.2 mmol/L 3.7 4.0 4.1  Chloride 96 - 106 mmol/L 104 103 103  CO2 20 - 29 mmol/L 26 22 29   Calcium 8.7 - 10.3 mg/dL 9.6 9.7 11.0(H)  Total Protein 6.1 - 8.1 g/dL - - 7.7  Total Bilirubin 0.2 - 1.2 mg/dL - - 0.6  Alkaline Phos 44 - 121 IU/L - - -  AST 10 - 35 U/L - - 17  ALT 6 - 29 U/L - - 11      RADIOGRAPHIC STUDIES: I have personally reviewed the radiological images as listed and agreed with the findings in the report. No results found.    Orders Placed This Encounter  Procedures   CT ABDOMEN PELVIS W CONTRAST    Standing Status:   Future    Standing Expiration Date:   01/29/2023    Order Specific Question:   If indicated for the ordered procedure, I authorize the administration of contrast media per Radiology protocol    Answer:   Yes    Order Specific Question:   Preferred imaging location?     Answer:   [facility]    Order Specific Question:   Release to patient    Answer:   Immediate    Order Specific Question:   Is Oral Contrast requested for this exam?    Answer:   Yes, Per Radiology protocol   All questions were answered. The patient knows to call the clinic with any problems, questions or concerns. No barriers to learning was detected. The total time spent in the appointment was 30 minutes.     Truitt Merle, MD 01/29/2022   I, Wilburn Mylar, am acting as scribe for Truitt Merle, MD.   I have reviewed the above documentation for accuracy and completeness, and I agree with the above.

## 2022-01-30 ENCOUNTER — Ambulatory Visit: Payer: PPO | Admitting: Oncology

## 2022-02-01 NOTE — Progress Notes (Signed)
Office Visit Note  Patient: Claudia Cantrell             Date of Birth: 1945-12-21           MRN: [id number]             PCP: Marcellina Millin Referring: Girtha Rm, PA-C Visit Date: 02/14/2022 Occupation: @GUAROCC @  Subjective:  Medication management  History of Present Illness: Claudia Cantrell is a 76 y.o. female with history of gout and osteoarthritis.  She has been to the allopurinol 100 mg p.o. daily.  She states she has not had a gout flare since she started taking allopurinol and 2021.  She did not have to take any colchicine.  She states recently she was switched from hydrochlorothiazide to Lasix.  This is helping with the pedal edema.  She continues to have some joint pain which she describes in her bilateral hands.  She feels some puffiness in her hands.  She takes Tylenol as needed joint pain.  Sometimes she experiences lower back pain.  Activities of Daily Living:  Patient reports morning stiffness for 1  hour.   Patient Denies nocturnal pain.  Difficulty dressing/grooming: Denies Difficulty climbing stairs: Denies Difficulty getting out of chair: Denies Difficulty using hands for taps, buttons, cutlery, and/or writing: Denies  Review of Systems  Constitutional:  Negative for fatigue.  HENT:  Negative for mouth sores, mouth dryness and nose dryness.   Eyes:  Positive for itching. Negative for pain and dryness.  Respiratory:  Negative for shortness of breath and difficulty breathing.   Cardiovascular:  Negative for chest pain and palpitations.  Gastrointestinal:  Negative for blood in stool, constipation and diarrhea.  Endocrine: Negative for increased urination.  Genitourinary:  Negative for difficulty urinating.  Musculoskeletal:  Positive for joint pain, joint pain, myalgias, muscle weakness, morning stiffness, muscle tenderness and myalgias. Negative for joint swelling.  Skin:  Negative for color change, rash and redness.  Allergic/Immunologic: Negative for  susceptible to infections.  Neurological:  Positive for numbness. Negative for dizziness and headaches.  Hematological:  Negative for bruising/bleeding tendency.  Psychiatric/Behavioral:  Negative for confusion.    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/27/2022   Acquired hypothyroidism 10/30/2021   Stromal tumor determined by gastric biopsy 06/24/2020   Iatrogenic hyperthyroidism 05/02/2020   Dyslipidemia, goal LDL below 70 05/02/2020   Acute combined systolic and diastolic CHF, NYHA class 3 ([place]) 09/23/2019   Persistent atrial fibrillation ([place]) 09/21/2019   Submucosal lesion of stomach 07/19/2019   Abnormal CT of the abdomen 07/19/2019   Abnormal CT scan, stomach 06/17/2019   Advance directive declined by patient 01/14/2019   Chronic anticoagulation 01/14/2019   CRI (chronic renal insufficiency), stage 2 (mild) 01/14/2019   Prediabetes 01/14/2019   Persistent proteinuria 02/17/2018   Hyperuricemia 03/26/2017   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 1992 s/p MI and PTCA of unknown vessel;  b. 09/2010 Cath: LM nl, LAD 20p, LCX 2m, RCA 72m, RPL 20.   Cervical cancer ([place]) 1977   Chronic kidney disease    Family history of anesthesia complication    daughter has difficulty waking    GERD (gastroesophageal reflux disease)    occ   Gout  10/08/2016   Hyperlipidemia    Hypertensive heart disease    Hypothyroidism    Nonischemic cardiomyopathy ([place])    a. 07/2016 Echo: EF 40-45%, mild LVH, inferior akinesis, moderately dilated left atrium, trivial AI and MR.   PAF (paroxysmal atrial fibrillation) ([place])    a. 07/2016 Admitted w/ AF RVR-->CHA2DS2VASc = 5-->Eliquis;  b. 07/2016 successful TEE/DCCV.   Sleep apnea    a. Using CPAP.    Family History  Problem Relation Age of Onset   Heart disease Mother    Sudden death Mother    Diabetes Father    Stroke Father    Bone cancer Sister    Sudden death Brother    Melanoma Brother    Heart disease Brother    Colon cancer Neg Hx    Esophageal cancer Neg Hx    Inflammatory bowel disease Neg Hx    Liver disease Neg Hx    Pancreatic cancer Neg Hx    Rectal cancer Neg Hx    Stomach cancer Neg Hx    Past Surgical History:  Procedure Laterality Date   ABDOMINAL HYSTERECTOMY  1988   ATRIAL FIBRILLATION ABLATION N/A 07/28/2021   Procedure: ATRIAL FIBRILLATION ABLATION;  Surgeon: Constance Haw, MD;  Location: [place] [facility];  Service: Cardiovascular;  Laterality: N/A;   [place]   BIOPSY  09/02/2019   Procedure: BIOPSY;  Surgeon: Rush Landmark Telford Nab., MD;  Location: [facility];  Service: Gastroenterology;;   BIOPSY  12/09/2019   Procedure: BIOPSY;  Surgeon: Irving Copas., MD;  Location: [facility];  Service: Gastroenterology;;   BIOPSY  03/08/2021   Procedure: BIOPSY;  Surgeon: Irving Copas., MD;  Location: [facility];  Service: Gastroenterology;;   [place]   PTCA BY DR [place] N/A 08/01/2016   Procedure: CARDIOVERSION;  Surgeon: Lelon Perla, MD;  Location: [facility];  Service: Cardiovascular;  Laterality: N/A;   CARDIOVERSION N/A 01/11/2020   Procedure: CARDIOVERSION;  Surgeon: Josue Hector, MD;  Location: [place];  Service: Cardiovascular;  Laterality: N/A;    CARDIOVERSION N/A 07/08/2020   Procedure: CARDIOVERSION;  Surgeon: [name], MD;  Location: [place];  Service: Cardiovascular;  Laterality: N/A;   ENDOSCOPIC MUCOSAL RESECTION N/A 12/09/2019   Procedure: ENDOSCOPIC MUCOSAL RESECTION;  Surgeon: Irving Copas., MD;  Location: [facility];  Service: Gastroenterology;  Laterality: N/A;   ESOPHAGOGASTRODUODENOSCOPY N/A 03/08/2021   Procedure: ESOPHAGOGASTRODUODENOSCOPY (EGD);  Surgeon: Irving Copas., MD;  Location: [facility];  Service: Gastroenterology;  Laterality: N/A;   ESOPHAGOGASTRODUODENOSCOPY (EGD) WITH PROPOFOL N/A 09/02/2019   Procedure: ESOPHAGOGASTRODUODENOSCOPY (EGD) WITH PROPOFOL;  Surgeon: Rush Landmark Telford Nab., MD;  Location: [facility];  Service: Gastroenterology;  Laterality: N/A;   ESOPHAGOGASTRODUODENOSCOPY (EGD) WITH PROPOFOL N/A 12/09/2019   Procedure: ESOPHAGOGASTRODUODENOSCOPY (EGD) WITH PROPOFOL;  Surgeon: Rush Landmark Telford Nab., MD;  Location: [facility];  Service: Gastroenterology;  Laterality: N/A;   EUS N/A 09/02/2019   Procedure: UPPER ENDOSCOPIC ULTRASOUND (EUS) RADIAL;  Surgeon: Irving Copas., MD;  Location: [facility];  Service: Gastroenterology;  Laterality: N/A;  EUS radial/linear   EUS N/A 12/09/2019   Procedure: UPPER ENDOSCOPIC ULTRASOUND (EUS) RADIAL;  Surgeon: Irving Copas., MD;  Location: [facility];  Service: Gastroenterology;  Laterality: N/A;   EUS  12/09/2019   Procedure: UPPER ENDOSCOPIC ULTRASOUND (EUS) LINEAR;  Surgeon: Irving Copas., MD;  Location: [facility];  Service: Gastroenterology;;   EUS N/A 03/08/2021   Procedure: UPPER ENDOSCOPIC ULTRASOUND (EUS) RADIAL;  Surgeon: Irving Copas., MD;  Location: [facility];  Service: Gastroenterology;  Laterality: N/A;   FINE NEEDLE ASPIRATION  09/02/2019   Procedure: FINE NEEDLE ASPIRATION (FNA) LINEAR;  Surgeon: Irving Copas., MD;  Location: [facility];   Service: Gastroenterology;;   FINE NEEDLE ASPIRATION  12/09/2019   Procedure: FINE NEEDLE ASPIRATION (FNA) LINEAR;  Surgeon: Irving Copas., MD;  Location: [facility];  Service: Gastroenterology;;   HEMOSTASIS CLIP PLACEMENT  12/09/2019   Procedure: HEMOSTASIS CLIP PLACEMENT;  Surgeon: Irving Copas., MD;  Location: [facility];  Service: Gastroenterology;;   ORIF ANKLE FRACTURE Right 04/27/2015   Procedure: OPEN REDUCTION INTERNAL FIXATION (ORIF) RIGHT BIMALLEOLAR ANKLE FRACTURE WITH SYNDESMOSIS FIXATION;  Surgeon: Leandrew Koyanagi, MD;  Location: [place];  Service: Orthopedics;  Laterality: Right;   SUBMUCOSAL LIFTING INJECTION  12/09/2019   Procedure: SUBMUCOSAL LIFTING INJECTION;  Surgeon: Irving Copas., MD;  Location: [facility];  Service: Gastroenterology;;   TEE WITHOUT CARDIOVERSION N/A 08/01/2016   Procedure: TRANSESOPHAGEAL ECHOCARDIOGRAM (TEE);  Surgeon: Lelon Perla, MD;  Location: [facility];  Service: Cardiovascular;  Laterality: N/A;   THYROIDECTOMY  11/24/2013   DR Dalbert Batman   THYROIDECTOMY N/A 11/24/2013   Procedure: TOTAL THYROIDECTOMY;  Surgeon: Adin Hector, MD;  Location: [place];  Service: General;  Laterality: N/A;   TRANSTHORACIC ECHOCARDIOGRAM  01/15/2013   EF 55% TO 65%. PROBABLE MILD HYPOKINESIS OF THE INFERIOR MYOCARDIUM. GRADE 1 DIASTOLIC DYSFUNCTION. TRIAL AR.LA IS MILDLY DILATED.   Social History   Social History Narrative   Right handed    Living  alone.   Immunization History  Administered Date(s) Administered   Fluad Quad(high Dose 65+) 08/22/2019, 09/20/2021   Influenza Nasal 11/25/2013   Influenza, High Dose Seasonal PF 12/30/2014, 10/14/2017, 11/01/2018   Influenza,inj,Quad PF,6+ Mos 11/25/2013   Influenza-Unspecified 12/30/2012, 11/09/2015, 12/24/2016, 09/16/2020   PFIZER(Purple Top)SARS-COV-2 Vaccination 12/29/2019, 01/18/2020, 09/16/2020   Pfizer Covid-19 Vaccine Bivalent Booster 5y-11y 10/21/2021   Pneumococcal  Conjugate-13 12/30/2014, 10/14/2017   Pneumococcal Polysaccharide-23 11/25/2013, 08/22/2019   Zoster Recombinat (Shingrix) 10/14/2017, 01/21/2018   Zoster, Live 12/15/2008     Objective: Vital Signs: BP 127/82 (BP Location: Right Arm, Patient Position: Sitting, Cuff Size: Large)    Pulse 80    Ht 5\' 5"  (1.651 m)    Wt 211 lb (95.7 kg)    BMI 35.11 kg/m    Physical Exam Vitals and nursing note reviewed.  Constitutional:      Appearance: She is well-developed.  HENT:     Head: Normocephalic and atraumatic.  Eyes:     Conjunctiva/sclera: Conjunctivae normal.  Cardiovascular:     Rate and Rhythm: Normal rate and regular rhythm.     Heart sounds: Normal heart sounds.  Pulmonary:     Effort: Pulmonary effort is normal.     Breath sounds: Normal breath sounds.  Abdominal:     General: Bowel sounds are normal.     Palpations: Abdomen is soft.  Musculoskeletal:     Cervical back: Normal range of motion.  Lymphadenopathy:     Cervical:  No cervical adenopathy.  Skin:    General: Skin is warm and dry.     Capillary Refill: Capillary refill takes less than 2 seconds.  Neurological:     Mental Status: She is alert and oriented to person, place, and time.  Psychiatric:        Behavior: Behavior normal.     Musculoskeletal Exam: C-spine was in good range of motion.  Shoulder joints, elbow joints, wrist joints, MCPs PIPs and DIPs with good range of motion with no synovitis.  Hip joints, knee joints, ankles, MTPs and PIPs with good range of motion with no synovitis.  CDAI Exam: CDAI Score: -- Patient Global: --; Provider Global: -- Swollen: --; Tender: -- Joint Exam 02/14/2022   No joint exam has been documented for this visit   There is currently no information documented on the homunculus. Go to the Rheumatology activity and complete the homunculus joint exam.  Investigation: No additional findings.  Imaging: CT ABDOMEN PELVIS W CONTRAST  Result Date: 02/12/2022 CLINICAL  DATA:  History of GIST, follow-up. EXAM: CT ABDOMEN AND PELVIS WITH CONTRAST TECHNIQUE: Multidetector CT imaging of the abdomen and pelvis was performed using the standard protocol following bolus administration of intravenous contrast. RADIATION DOSE REDUCTION: This exam was performed according to the departmental dose-optimization program which includes automated exposure control, adjustment of the mA and/or kV according to patient size and/or use of iterative reconstruction technique. CONTRAST:  128mL OMNIPAQUE IOHEXOL 300 MG/ML  SOLN COMPARISON:  February 16, 2021 FINDINGS: Lower chest: No acute abnormality. Hepatobiliary: Similar mild hepatomegaly and hepatic steatosis. Stable 2.2 cm cyst in the right lobe of the liver. Subcentimeter hypodense lesion in the left lobe of the liver on image 24/2 and inferior right lobe of the liver on image 36/2 are also stable and likely reflects cysts. Gallbladder is unremarkable. No biliary ductal dilation. Pancreas: No pancreatic ductal dilation or evidence of acute inflammation. Spleen: No splenomegaly or focal splenic lesion. Adrenals/Urinary Tract: Bilateral adrenal glands appear normal. No hydronephrosis. Left renal cysts are stable and measure up to 1.8 cm. Additional scattered tiny subcentimeter hypodense renal lesions are technically too small to accurately characterize. Urinary bladder is unremarkable for degree of distension. Stomach/Bowel: Radiopaque enteric contrast material traverses the hepatic flexure. Stomach is nondistended limiting evaluation. Stable partially calcified 13 mm exophytic nodule along the distal ventral stomach on image 18/2. No pathologic dilation of small or large bowel. The appendix and terminal ileum appear normal. Moderate volume of formed stool throughout the colon. Sigmoid colonic diverticulosis without findings of acute diverticulitis. Vascular/Lymphatic: Aortic and branch vessel atherosclerosis without abdominal aortic aneurysm. No  pathologically enlarged abdominal or pelvic lymph nodes. Reproductive: No acute or suspicious finding. Other: No significant abdominopelvic free fluid. Sequela of subcutaneous injections in the posterior gluteal soft tissues. Musculoskeletal: Multilevel degenerative changes spine. No aggressive lytic or blastic lesion of bone. IMPRESSION: 1. Stable partially calcified 13 mm exophytic nodule along the distal ventral stomach. 2. Similar mild hepatomegaly with hepatic steatosis. 3. Sigmoid colonic diverticulosis without findings of acute diverticulitis. 4. Moderate volume of formed stool throughout the colon. 5.  Aortic Atherosclerosis (ICD10-I70.0). Electronically Signed   By: Dahlia Bailiff M.D.   On: 02/12/2022 10:35    Recent Labs: Lab Results  Component Value Date   WBC 3.8 10/18/2021   HGB 15.7 (H) 10/18/2021   PLT 241 10/18/2021   NA 144 01/24/2022   K 3.7 01/24/2022   CL 104 01/24/2022   CO2 26 01/24/2022   GLUCOSE  86 01/24/2022   BUN 10 01/24/2022   CREATININE 1.13 (H) 01/24/2022   BILITOT 0.6 10/18/2021   ALKPHOS 103 03/24/2021   AST 17 10/18/2021   ALT 11 10/18/2021   PROT 7.7 10/18/2021   ALBUMIN 4.9 (H) 03/24/2021   CALCIUM 9.6 01/24/2022   GFRAA 74 09/16/2020    Speciality Comments: No specialty comments available.  Procedures:  No procedures performed Allergies: Labetalol and Codeine   Assessment / Plan:     Visit Diagnoses: Idiopathic chronic gout of multiple sites without tophus - uric acid: 3.7 on 10/18/2021  -patient denies any gout flare.  I placed her on allopurinol in 2019.  She has done well on allopurinol 100 mg p.o. daily since then.  Now allopurinol as prescribed by Dr. Carolin Sicks.  Patient complains of some stiffness in her joints but no synovitis was noted.  Plan: Uric acid  Hyperuricemia - Allopurinol 100 mg 1 tablet by mouth daily.    Medication management -I will check labs today.  Plan: CBC with Differential/Platelet, COMPLETE METABOLIC PANEL WITH  GFR  Pain in both hands-she complains of increased discomfort in her hands off and on and some joint stiffness.  No synovitis was noted.  Trigger finger, left ring finger-she has intermittent symptoms.  No flexor tendon thickening was noted.  Advised her to contact us in case her symptoms get worse.  Other medical problems are listed as follows:  History of vitamin D deficiency  History of hypertension  History of coronary artery disease  History of hyperlipidemia  History of atrial fibrillation - She underwent a cardiac ablation on 07/28/2021 performed by Dr. Curt Bears.  She is on Eliquis for atrial fibrillation.  History of gastrointestinal stromal tumor (GIST)-followed by oncology.  Chronic anticoagulation  Elevated hemoglobin A1c  History of hypothyroidism  OSA on CPAP  Orders: Orders Placed This Encounter  Procedures   CBC with Differential/Platelet   COMPLETE METABOLIC PANEL WITH GFR   Uric acid   No orders of the defined types were placed in this encounter.    Follow-Up Instructions: Return in about 6 months (around 08/17/2022) for Gout.   Bo Merino, MD  Note - This record has been created using Editor, commissioning.  Chart creation errors have been sought, but may not always  have been located. Such creation errors do not reflect on  the standard of medical care.

## 2022-02-05 ENCOUNTER — Ambulatory Visit: Payer: PPO | Admitting: Internal Medicine

## 2022-02-12 ENCOUNTER — Ambulatory Visit (HOSPITAL_COMMUNITY)
Admission: RE | Admit: 2022-02-12 | Discharge: 2022-02-12 | Disposition: A | Discharge status: Home / Self Care | Payer: Medicare Other | Source: Ambulatory Visit | Attending: Hematology | Admitting: Hematology

## 2022-02-12 ENCOUNTER — Other Ambulatory Visit: Payer: Self-pay

## 2022-02-12 DIAGNOSIS — C49A2 Gastrointestinal stromal tumor of stomach: Secondary | ICD-10-CM | POA: Insufficient documentation

## 2022-02-12 MED ORDER — IOHEXOL 300 MG/ML  SOLN
100.0000 mL | Freq: Once | INTRAMUSCULAR | Status: AC | PRN
  Administered 2022-02-12: 100 mL via INTRAVENOUS

## 2022-02-12 MED ORDER — SODIUM CHLORIDE (PF) 0.9 % IJ SOLN
INTRAMUSCULAR | Status: AC
  Filled 2022-02-12: qty 50

## 2022-02-13 ENCOUNTER — Telehealth: Payer: Self-pay

## 2022-02-13 NOTE — Telephone Encounter (Signed)
LVM informing pt that Dr. Burr Medico has reviewed her recent CT Scan results.  Pt's CT showed stable GIST Tumor with no disease progression or metastasis.  Instructed pt to please feel free to contact Dr. Ernestina Penna office should she have additional questions or concerns.  ?

## 2022-02-14 ENCOUNTER — Other Ambulatory Visit: Payer: Self-pay

## 2022-02-14 ENCOUNTER — Encounter: Payer: Self-pay | Admitting: Rheumatology

## 2022-02-14 ENCOUNTER — Ambulatory Visit: Payer: PPO | Admitting: Rheumatology

## 2022-02-14 VITALS — BP 127/82 | HR 80 | Ht 65.0 in | Wt 211.0 lb

## 2022-02-14 DIAGNOSIS — R7309 Other abnormal glucose: Secondary | ICD-10-CM

## 2022-02-14 DIAGNOSIS — Z8509 Personal history of malignant neoplasm of other digestive organs: Secondary | ICD-10-CM

## 2022-02-14 DIAGNOSIS — M1A09X0 Idiopathic chronic gout, multiple sites, without tophus (tophi): Secondary | ICD-10-CM

## 2022-02-14 DIAGNOSIS — G4733 Obstructive sleep apnea (adult) (pediatric): Secondary | ICD-10-CM

## 2022-02-14 DIAGNOSIS — Z8639 Personal history of other endocrine, nutritional and metabolic disease: Secondary | ICD-10-CM

## 2022-02-14 DIAGNOSIS — Z7901 Long term (current) use of anticoagulants: Secondary | ICD-10-CM

## 2022-02-14 DIAGNOSIS — E790 Hyperuricemia without signs of inflammatory arthritis and tophaceous disease: Secondary | ICD-10-CM | POA: Diagnosis not present

## 2022-02-14 DIAGNOSIS — Z79899 Other long term (current) drug therapy: Secondary | ICD-10-CM | POA: Diagnosis not present

## 2022-02-14 DIAGNOSIS — Z8679 Personal history of other diseases of the circulatory system: Secondary | ICD-10-CM

## 2022-02-14 DIAGNOSIS — M79641 Pain in right hand: Secondary | ICD-10-CM | POA: Diagnosis not present

## 2022-02-14 DIAGNOSIS — M79642 Pain in left hand: Secondary | ICD-10-CM

## 2022-02-14 DIAGNOSIS — M65342 Trigger finger, left ring finger: Secondary | ICD-10-CM

## 2022-02-14 DIAGNOSIS — Z9989 Dependence on other enabling machines and devices: Secondary | ICD-10-CM

## 2022-02-15 LAB — COMPLETE METABOLIC PANEL WITH GFR
AG Ratio: 1.3 (calc) (ref 1.0–2.5)
ALT: 20 U/L (ref 6–29)
AST: 17 U/L (ref 10–35)
Albumin: 4.4 g/dL (ref 3.6–5.1)
Alkaline phosphatase (APISO): 94 U/L (ref 37–153)
BUN/Creatinine Ratio: 11 (calc) (ref 6–22)
BUN: 12 mg/dL (ref 7–25)
CO2: 27 mmol/L (ref 20–32)
Calcium: 9.9 mg/dL (ref 8.6–10.4)
Chloride: 109 mmol/L (ref 98–110)
Creat: 1.13 mg/dL — ABNORMAL HIGH (ref 0.60–1.00)
Globulin: 3.3 g/dL (calc) (ref 1.9–3.7)
Glucose, Bld: 79 mg/dL (ref 65–99)
Potassium: 4.4 mmol/L (ref 3.5–5.3)
Sodium: 144 mmol/L (ref 135–146)
Total Bilirubin: 0.6 mg/dL (ref 0.2–1.2)
Total Protein: 7.7 g/dL (ref 6.1–8.1)
eGFR: 51 mL/min/{1.73_m2} — ABNORMAL LOW (ref >=60)

## 2022-02-15 LAB — CBC WITH DIFFERENTIAL/PLATELET
Absolute Monocytes: 361 cells/uL (ref 200–950)
Basophils Absolute: 19 cells/uL (ref 0–200)
Basophils Relative: 0.5 %
Eosinophils Absolute: 99 cells/uL (ref 15–500)
Eosinophils Relative: 2.6 %
HCT: 48.8 % — ABNORMAL HIGH (ref 35.0–45.0)
Hemoglobin: 15.9 g/dL — ABNORMAL HIGH (ref 11.7–15.5)
Lymphs Abs: 1231 cells/uL (ref 850–3900)
MCH: 31 pg (ref 27.0–33.0)
MCHC: 32.6 g/dL (ref 32.0–36.0)
MCV: 95.1 fL (ref 80.0–100.0)
MPV: 9.3 fL (ref 7.5–12.5)
Monocytes Relative: 9.5 %
Neutro Abs: 2090 cells/uL (ref 1500–7800)
Neutrophils Relative %: 55 %
Platelets: 230 10*3/uL (ref 140–400)
RBC: 5.13 10*6/uL — ABNORMAL HIGH (ref 3.80–5.10)
RDW: 13.9 % (ref 11.0–15.0)
Total Lymphocyte: 32.4 %
WBC: 3.8 10*3/uL (ref 3.8–10.8)

## 2022-02-15 LAB — URIC ACID: Uric Acid, Serum: 2.8 mg/dL (ref 2.5–7.0)

## 2022-02-15 NOTE — Progress Notes (Signed)
Hemoglobin is  high and stable.  Creatinine is elevated and stable.  Uric acid is in desirable range.

## 2022-02-22 ENCOUNTER — Other Ambulatory Visit: Payer: Self-pay

## 2022-02-22 ENCOUNTER — Encounter: Payer: Self-pay | Admitting: Internal Medicine

## 2022-02-22 ENCOUNTER — Ambulatory Visit (INDEPENDENT_AMBULATORY_CARE_PROVIDER_SITE_OTHER): Payer: Medicare Other | Admitting: Internal Medicine

## 2022-02-22 VITALS — BP 112/74 | HR 81 | Ht 65.0 in | Wt 212.0 lb

## 2022-02-22 DIAGNOSIS — E890 Postprocedural hypothyroidism: Secondary | ICD-10-CM | POA: Diagnosis not present

## 2022-02-22 DIAGNOSIS — R7303 Prediabetes: Secondary | ICD-10-CM | POA: Diagnosis not present

## 2022-02-22 LAB — TSH: TSH: 1.33 u[IU]/mL (ref 0.35–5.50)

## 2022-02-22 NOTE — Progress Notes (Signed)
? ? ?Name: Claudia Cantrell  ?MRN/ DOB: [id number], Apr 06, 1946    ?Age/ Sex: 76 y.o., female   ? ?PCP: Irene Pap, PA-C   ?Reason for Endocrinology Evaluation: Hypothyroidism  ?   ?Date of Initial Endocrinology Evaluation: 02/22/2022   ? ? ?HPI: ?[name] is a 76 y.o. female with a past medical history of postoperative hypothyroidism, HTN, dyslipidemia and A.Fib . The patient presented for initial endocrinology clinic visit on 02/22/2022 for consultative assistance with her Hypothyroidism.  ? 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 ? ?She follows with rheumatology for gout ?Follows with cardiology for A.Fib , S/P ablation  ?Follows with neurology for cervical dystonia  ? ?Denies prior exposure to radiation  ? ? ?SUBJECTIVE:  ? ? ?Today (02/22/22):  Ms. Savard is here for a follow up on postoperative hypothyroidism and Pre-diabetes.  ? ?Had a CT scan for an abdominal issue  ?Denies abdominal pain, occasional constipation  ? ?S/P cardiac ablation 07/2021 ? ? ?Weight stable  ?Has an episode of palpitations  ?Has noted fatigue with walking , which has been improving  ?  ?Of note, she has pre-diabetes  , she has been working on low carb diet  ? ? ? ?HISTORY:  ?Past Medical History:  ?Past Medical History:  ?Diagnosis Date  ? 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 CAD (coronary artery disease)   ? a. 1992 s/p MI and PTCA of unknown vessel;  b. 09/2010 Cath: LM nl, LAD 20p, LCX 78m RCA 117mRPL 20.  ? Cervical cancer ([place]  ? Chronic kidney disease   ? Family history of anesthesia complication   ? daughter has difficulty waking   ? GERD (gastroesophageal reflux disease)   ? occ  ? Gout 10/08/2016  ? Hyperlipidemia   ? Hypertensive heart disease   ? Hypothyroidism   ? Nonischemic cardiomyopathy ([facility]  ? a. 07/2016 Echo: EF 40-45%, mild LVH, inferior akinesis, moderately dilated left atrium, trivial AI and MR.  ? PAF (paroxysmal atrial fibrillation) ([place]  ? a. 07/2016 Admitted w/  AF RVR-->CHA2DS2VASc = 5-->Eliquis;  b. 07/2016 successful TEE/DCCV.  ? Sleep apnea   ? a. Using CPAP.  ? ?Past Surgical History:  ?Past Surgical History:  ?Procedure Laterality Date  ? ABDOMINAL HYSTERECTOMY  1988  ? ATRIAL FIBRILLATION ABLATION N/A 07/28/2021  ? Procedure: ATRIAL FIBRILLATION ABLATION;  Surgeon: CaConstance HawMD;  [facility];  Service: Cardiovascular;  Laterality: N/A;  ? [place] BI[place]SY  09/02/2019  ? Procedure: BIOPSY;  Surgeon: MaIrving Copas MD;  [facility];  Service: Gastroenterology;;  ? BIOPSY  12/09/2019  ? Procedure: BIOPSY;  Surgeon: MaIrving Copas MD;  [facility];  Service: Gastroenterology;;  ? BIOPSY  03/08/2021  ? Procedure: BIOPSY;  Surgeon: MaIrving Copas MD;  [facility];  Service: Gastroenterology;;  ? [place]? PTCA BY DR CHLamount Cohen? CARDIOVERSION N/A 08/01/2016  ? Procedure: CARDIOVERSION;  Surgeon: BrLelon PerlaMD;  [place] Service: Cardiovascular;  Laterality: N/A;  ? CARDIOVERSION N/A 01/11/2020  ? Procedure: CARDIOVERSION;  Surgeon: NiJosue HectorMD;  [facility];  Service: Cardiovascular;  Laterality: N/A;  ? CARDIOVERSION N/A 07/08/2020  ? Procedure: CARDIOVERSION;  Surgeon: ScDonato HeinzMD;  [facility] Service: Cardiovascular;  Laterality: N/A;  ? ENDOSCOPIC  MUCOSAL RESECTION N/A 12/09/2019  ? Procedure: ENDOSCOPIC MUCOSAL RESECTION;  Surgeon: Rush Landmark Telford Nab., MD;  [facility];  Service: Gastroenterology;  Laterality: N/A;  ? ESOPHAGOGASTRODUODENOSCOPY N/A 03/08/2021  ? Procedure: ESOPHAGOGASTRODUODENOSCOPY (EGD);  Surgeon: Irving Copas., MD;  [facility];  Service: Gastroenterology;  Laterality: N/A;  ? ESOPHAGOGASTRODUODENOSCOPY (EGD) WITH PROPOFOL N/A 09/02/2019  ? Procedure: ESOPHAGOGASTRODUODENOSCOPY (EGD) WITH PROPOFOL;  Surgeon:  Rush Landmark Telford Nab., MD;  [facility];  Service: Gastroenterology;  Laterality: N/A;  ? ESOPHAGOGASTRODUODENOSCOPY (EGD) WITH PROPOFOL N/A 12/09/2019  ? Procedure: ESOPHAGOGASTRODUODENOSCOPY (EGD) WITH PROPOFOL;  Surgeon: Rush Landmark Telford Nab., MD;  [facility];  Service: Gastroenterology;  Laterality: N/A;  ? EUS N/A 09/02/2019  ? Procedure: UPPER ENDOSCOPIC ULTRASOUND (EUS) RADIAL;  Surgeon: Rush Landmark Telford Nab., MD;  [facility];  Service: Gastroenterology;  Laterality: N/A;  EUS radial/linear  ? EUS N/A 12/09/2019  ? Procedure: UPPER ENDOSCOPIC ULTRASOUND (EUS) RADIAL;  Surgeon: Rush Landmark Telford Nab., MD;  [facility];  Service: Gastroenterology;  Laterality: N/A;  ? EUS  12/09/2019  ? Procedure: UPPER ENDOSCOPIC ULTRASOUND (EUS) LINEAR;  Surgeon: Irving Copas., MD;  [facility];  Service: Gastroenterology;;  ? EUS N/A 03/08/2021  ? Procedure: UPPER ENDOSCOPIC ULTRASOUND (EUS) RADIAL;  Surgeon: Rush Landmark Telford Nab., MD;  [facility];  Service: Gastroenterology;  Laterality: N/A;  ? FINE NEEDLE ASPIRATION  09/02/2019  ? Procedure: FINE NEEDLE ASPIRATION (FNA) LINEAR;  Surgeon: Irving Copas., MD;  [facility];  Service: Gastroenterology;;  ? FINE NEEDLE ASPIRATION  12/09/2019  ? Procedure: FINE NEEDLE ASPIRATION (FNA) LINEAR;  Surgeon: Irving Copas., MD;  [facility];  Service: Gastroenterology;;  ? HEMOSTASIS CLIP PLACEMENT  12/09/2019  ? Procedure: HEMOSTASIS CLIP PLACEMENT;  Surgeon: Irving Copas., MD;  [facility];  Service: Gastroenterology;;  ? ORIF ANKLE FRACTURE Right 04/27/2015  ? Procedure: OPEN REDUCTION INTERNAL FIXATION (ORIF) RIGHT BIMALLEOLAR ANKLE FRACTURE WITH SYNDESMOSIS FIXATION;  Surgeon: Leandrew Koyanagi, MD;  Location: [place];  Service: Orthopedics;  Laterality: Right;  ? SUBMUCOSAL LIFTING INJECTION  12/09/2019  ? Procedure: SUBMUCOSAL LIFTING  INJECTION;  Surgeon: Irving Copas., MD;  [facility];  Service: Gastroenterology;;  ? TEE WITHOUT CARDIOVERSION N/A 08/01/2016  ? Procedure: TRANSESOPHAGEAL ECHOCARDIOGRAM (TEE);  Surgeon: Lelon Perla, MD;  [facility];  Service: Cardiovascular;  Laterality: N/A;  ? THYROIDECTOMY  11/24/2013  ? DR Dalbert Batman  ? THYROIDECTOMY N/A 11/24/2013  ? Procedure: TOTAL THYROIDECTOMY;  Surgeon: Adin Hector, MD;  Location: [place];  Service: General;  Laterality: N/A;  ? TRANSTHORACIC ECHOCARDIOGRAM  01/15/2013  ? EF 55% TO 65%. PROBABLE MILD HYPOKINESIS OF THE INFERIOR MYOCARDIUM. GRADE 1 DIASTOLIC DYSFUNCTION. TRIAL AR.LA IS MILDLY DILATED.  ?  ?Social History:  reports that she quit smoking about 31 years ago. Her smoking use included cigarettes. She has a 15.00 pack-year smoking history. She has never been exposed to tobacco smoke. She has never used smokeless tobacco. She reports current alcohol use. She reports that she does not use drugs. ?Family History: family history includes Bone cancer in her sister; Diabetes in her father; Heart disease in her brother and mother; Melanoma in her brother; Stroke in her father; Sudden death in her brother and mother. ? ? ?HOME MEDICATIONS: ?Allergies as of 02/22/2022   ? ?   Reactions  ? Labetalol   ? headaches  ? Codeine Anxiety  ? ?  ? ?  ?Medication List  ?  ? ?  ?  Accurate as of February 22, 2022 10:26 AM. If you have any questions, ask your nurse or doctor.  ?  ?  ? ?  ? ?acetaminophen 650 MG CR tablet ?Commonly known as: TYLENOL ?Take 1,300 mg by mouth every 8 (eight) hours as needed for pain. ?  ?allopurinol 100 MG tablet ?Commonly known as: ZYLOPRIM ?Take 100 mg by mouth in the morning. ?  ?amLODipine 5 MG tablet ?Commonly known as: NORVASC ?TAKE 1 & 1/2 (ONE & ONE-HALF) TABLETS BY MOUTH IN THE EVENING ?  ?carvedilol 12.5 MG tablet ?Commonly known as: COREG ?Take 1 tablet (12.5 mg total) by mouth 2 (two) times daily. ?  ?Eliquis 5 MG Tabs  tablet ?Generic drug: apixaban ?Take 1 tablet by mouth twice daily ?  ?Farxiga 10 MG Tabs tablet ?Generic drug: dapagliflozin propanediol ?Take 10 mg by mouth in the morning. ?  ?furosemide 20 MG tablet ?Commonly kn

## 2022-02-22 NOTE — Patient Instructions (Signed)

## 2022-02-23 ENCOUNTER — Telehealth: Payer: Self-pay | Admitting: Internal Medicine

## 2022-02-23 MED ORDER — SYNTHROID 88 MCG PO TABS
88.0000 ug | ORAL_TABLET | Freq: Every day | ORAL | 3 refills | Status: DC
Start: 2022-02-23 — End: 2022-06-28

## 2022-02-23 NOTE — Telephone Encounter (Signed)
Please let her know thyroid function is normal and to continue current dose of Synthroid  ? ? ? ?Thanks  ?

## 2022-02-23 NOTE — Telephone Encounter (Signed)
Patient notified and verbalized understanding. 

## 2022-02-26 ENCOUNTER — Other Ambulatory Visit: Payer: Self-pay | Admitting: *Deleted

## 2022-02-26 MED ORDER — ALLOPURINOL 100 MG PO TABS: 100.0000 mg | ORAL_TABLET | Freq: Every morning | ORAL | 0 refills | Status: DC

## 2022-02-26 NOTE — Telephone Encounter (Signed)
Refill request received via fax ? ?Next Visit: 08/16/2022 ? ?Last Visit: 02/14/2022 ? ?DX:  Idiopathic chronic gout of multiple sites without tophus ? ?Current Dose per office note 02/14/2022: allopurinol 100 mg p.o. daily  ? ?Labs: 02/14/2022 Hemoglobin is  high and stable.  Creatinine is elevated and stable.  Uric acid is in desirable range. ? ?Okay to refill Allopurinol?  ?

## 2022-03-16 ENCOUNTER — Other Ambulatory Visit: Payer: Self-pay | Admitting: Medical

## 2022-03-24 ENCOUNTER — Other Ambulatory Visit: Payer: Self-pay | Admitting: Cardiovascular Disease

## 2022-03-24 ENCOUNTER — Emergency Department (HOSPITAL_BASED_OUTPATIENT_CLINIC_OR_DEPARTMENT_OTHER)
Admission: EM | Admit: 2022-03-24 | Discharge: 2022-03-24 | Disposition: A | Discharge status: Home / Self Care | Payer: Medicare Other | Attending: Emergency Medicine | Admitting: Emergency Medicine

## 2022-03-24 ENCOUNTER — Emergency Department (HOSPITAL_BASED_OUTPATIENT_CLINIC_OR_DEPARTMENT_OTHER): Payer: Medicare Other

## 2022-03-24 ENCOUNTER — Other Ambulatory Visit: Payer: Self-pay

## 2022-03-24 ENCOUNTER — Encounter (HOSPITAL_BASED_OUTPATIENT_CLINIC_OR_DEPARTMENT_OTHER): Payer: Self-pay

## 2022-03-24 DIAGNOSIS — I4891 Unspecified atrial fibrillation: Secondary | ICD-10-CM | POA: Insufficient documentation

## 2022-03-24 DIAGNOSIS — I110 Hypertensive heart disease with heart failure: Secondary | ICD-10-CM | POA: Diagnosis not present

## 2022-03-24 DIAGNOSIS — R0602 Shortness of breath: Secondary | ICD-10-CM | POA: Diagnosis present

## 2022-03-24 DIAGNOSIS — Z79899 Other long term (current) drug therapy: Secondary | ICD-10-CM | POA: Insufficient documentation

## 2022-03-24 DIAGNOSIS — Z7901 Long term (current) use of anticoagulants: Secondary | ICD-10-CM | POA: Insufficient documentation

## 2022-03-24 DIAGNOSIS — I509 Heart failure, unspecified: Secondary | ICD-10-CM | POA: Diagnosis not present

## 2022-03-24 DIAGNOSIS — I2510 Atherosclerotic heart disease of native coronary artery without angina pectoris: Secondary | ICD-10-CM | POA: Diagnosis not present

## 2022-03-24 DIAGNOSIS — U071 COVID-19: Secondary | ICD-10-CM

## 2022-03-24 LAB — COMPREHENSIVE METABOLIC PANEL
ALT: 16 U/L (ref 0–44)
AST: 21 U/L (ref 15–41)
Albumin: 4.6 g/dL (ref 3.5–5.0)
Alkaline Phosphatase: 78 U/L (ref 38–126)
Anion gap: 10 (ref 5–15)
BUN: 17 mg/dL (ref 8–23)
CO2: 30 mmol/L (ref 22–32)
Calcium: 10.1 mg/dL (ref 8.9–10.3)
Chloride: 100 mmol/L (ref 98–111)
Creatinine, Ser: 1.38 mg/dL — ABNORMAL HIGH (ref 0.44–1.00)
GFR, Estimated: 40 mL/min — ABNORMAL LOW (ref >60)
Glucose, Bld: 98 mg/dL (ref 70–99)
Potassium: 3.2 mmol/L — ABNORMAL LOW (ref 3.5–5.1)
Sodium: 140 mmol/L (ref 135–145)
Total Bilirubin: 0.6 mg/dL (ref 0.3–1.2)
Total Protein: 8.5 g/dL — ABNORMAL HIGH (ref 6.5–8.1)

## 2022-03-24 LAB — CBC WITH DIFFERENTIAL/PLATELET
Abs Immature Granulocytes: 0.01 10*3/uL (ref 0.00–0.07)
Basophils Absolute: 0 10*3/uL (ref 0.0–0.1)
Basophils Relative: 0 %
Eosinophils Absolute: 0.1 10*3/uL (ref 0.0–0.5)
Eosinophils Relative: 2 %
HCT: 50.4 % — ABNORMAL HIGH (ref 36.0–46.0)
Hemoglobin: 16 g/dL — ABNORMAL HIGH (ref 12.0–15.0)
Immature Granulocytes: 0 %
Lymphocytes Relative: 33 %
Lymphs Abs: 1.1 10*3/uL (ref 0.7–4.0)
MCH: 30 pg (ref 26.0–34.0)
MCHC: 31.7 g/dL (ref 30.0–36.0)
MCV: 94.4 fL (ref 80.0–100.0)
Monocytes Absolute: 0.3 10*3/uL (ref 0.1–1.0)
Monocytes Relative: 9 %
Neutro Abs: 1.9 10*3/uL (ref 1.7–7.7)
Neutrophils Relative %: 56 %
Platelets: 187 10*3/uL (ref 150–400)
RBC: 5.34 MIL/uL — ABNORMAL HIGH (ref 3.87–5.11)
RDW: 13.7 % (ref 11.5–15.5)
WBC: 3.4 10*3/uL — ABNORMAL LOW (ref 4.0–10.5)
nRBC: 0 % (ref 0.0–0.2)

## 2022-03-24 LAB — RESP PANEL BY RT-PCR (FLU A&B, COVID) ARPGX2
Influenza A by PCR: NEGATIVE
Influenza B by PCR: NEGATIVE
SARS Coronavirus 2 by RT PCR: POSITIVE — AB

## 2022-03-24 LAB — MAGNESIUM: Magnesium: 2.6 mg/dL — ABNORMAL HIGH (ref 1.7–2.4)

## 2022-03-24 LAB — TROPONIN I (HIGH SENSITIVITY)
Troponin I (High Sensitivity): 6 ng/L (ref <18)
Troponin I (High Sensitivity): 5 ng/L (ref <18)

## 2022-03-24 LAB — TSH: TSH: 4.058 u[IU]/mL (ref 0.350–4.500)

## 2022-03-24 LAB — BRAIN NATRIURETIC PEPTIDE: B Natriuretic Peptide: 161.2 pg/mL — ABNORMAL HIGH (ref 0.0–100.0)

## 2022-03-24 MED ORDER — ASPIRIN 81 MG PO CHEW
324.0000 mg | CHEWABLE_TABLET | Freq: Once | ORAL | Status: AC
  Administered 2022-03-24: 324 mg via ORAL
  Filled 2022-03-24: qty 4

## 2022-03-24 MED ORDER — POTASSIUM CHLORIDE 20 MEQ PO PACK: 40.0000 meq | PACK | Freq: Once | ORAL | Status: DC

## 2022-03-24 MED ORDER — MOLNUPIRAVIR EUA 200MG CAPSULE: 4.0000 | ORAL_CAPSULE | Freq: Two times a day (BID) | ORAL | 0 refills | Status: DC

## 2022-03-24 NOTE — ED Triage Notes (Signed)
Pt presents with a "cold". Pt reports she started with a cough yesterday. Did not call her PCP as she though it would pass. Last night pt began feeling fatigue, body aches and chest tightness. SOB that worsens with exertion  ?

## 2022-03-24 NOTE — ED Notes (Signed)
Dc instructions reviewed with patient. Patient voiced understanding. Dc with belongings.  °

## 2022-03-24 NOTE — Discharge Instructions (Signed)
A prescription for an antiviral medication was sent to pharmacy.  This is the antiviral medication that does not have the known interactions with the medications that you currently take.  Although there are not known interactions, allergic response and/or medication interactions are possible.  Discontinue use of this medication if you do experience any allergic symptoms or if you experience nausea, diarrhea, or dizziness.  You can continue to take Tylenol as well for relief of symptoms as needed.  If you develop worsening severity of symptoms from your COVID infection, please return to the emergency department. ?

## 2022-03-24 NOTE — ED Provider Notes (Signed)
?[facility] DE[facility] ?Provider Note ? ? ?CSN: [id number] ?Arrival date & time: 03/24/22  0750 ? ?  ? ?History ? ?Chief Complaint  ?Patient presents with  ? Chest Pain  ? ? ?Claudia Cantrell is a 76 y.o. female. ? ? ?Chest Pain ?Associated symptoms: cough and shortness of breath   ?Patient presenting for chest pain.  She had onset of a cough yesterday.  Overnight, she felt fatigue, body aches, and chest tightness.  She has also had exertional shortness of breath.  Medical history includes OSA, HTN, CAD, gout, prediabetes, atrial fibrillation, CHF, HLD, and GIST.  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  Last oncology visit was 2/20.  Chest tightness that began last night has continued this morning.  For this reason, she presents to the emergency department.  She has not taken her morning dose of Eliquis. ?  ? ?Home Medications ?Prior to Admission medications   ?Medication Sig Start Date End Date Taking? Authorizing Provider  ?molnupiravir EUA (LAGEVRIO) 200 mg CAPS capsule Take 4 capsules (800 mg total) by mouth 2 (two) times daily for 5 days. 03/24/22 03/29/22 Yes Godfrey Pick, MD  ?acetaminophen (TYLENOL) 650 MG CR tablet Take 1,300 mg by mouth every 8 (eight) hours as needed for pain.     [provider]  ?allopurinol (ZYLOPRIM) 100 MG tablet Take 1 tablet (100 mg total) by mouth in the morning. 02/26/22   Ofilia Neas, PA-C  ?amLODipine (NORVASC) 5 MG tablet TAKE 1 & 1/2 (ONE & ONE-HALF) TABLETS BY MOUTH IN THE EVENING 12/25/21   Troy Sine, MD  ?apixaban Arne [place]) 5 MG TABS tablet Take 1 tablet by mouth twice daily 11/21/21   Camnitz, Ocie Doyne, MD  ?carvedilol (COREG) 12.5 MG tablet Take 1 tablet (12.5 mg total) by mouth 2 (two) times daily. 11/01/21 02/14/22  Camnitz, Ocie Doyne, MD  ?FARXIGA 10 MG TABS tablet Take 10 mg by mouth in the morning. 01/13/21   [provider]  ?furosemide (LASIX) 20 MG tablet Take 20 mg by mouth  daily. 10/31/21   [provider]  ?isosorbide dinitrate (ISORDIL) 5 MG tablet Take 1 tablet (5 mg total) by mouth 2 (two) times daily. 06/20/21   Troy Sine, MD  ?losartan (COZAAR) 25 MG tablet Take 1 tablet (25 mg total) by mouth at bedtime. 01/10/22 04/10/22  Shirley Friar, PA-C  ?Magnesium 400 MG TABS Take 400 mg by mouth every evening.    [provider]  ?Multiple Vitamins-Minerals (MULTIVITAMIN WITH MINERALS) tablet Take 1 tablet by mouth 3 (three) times a week.    [provider]  ?olmesartan (BENICAR) 40 MG tablet Take 40 mg by mouth daily. 02/21/22   [provider]  ?Probiotic Product (PROBIOTIC PO) Take 1 capsule by mouth at bedtime.    [provider]  ?rosuvastatin (CRESTOR) 40 MG tablet Take 1 tablet by mouth once daily 10/23/21   Troy Sine, MD  ?SYNTHROID 88 MCG tablet Take 1 tablet (88 mcg total) by mouth daily before breakfast. 02/23/22   Shamleffer, Melanie Crazier, MD  ?Vitamin D3 (VITAMIN D) 25 MCG tablet Take 1,000 Units by mouth daily in the afternoon.    [provider]  ?zinc gluconate 50 MG tablet Take 50 mg by mouth daily in the afternoon.    [provider]  ?   ? ?Allergies    ?Labetalol and Codeine   ? ?Review of Systems   ?  Review of Systems  ?Respiratory:  Positive for cough, chest tightness and shortness of breath.   ?All other systems reviewed and are negative. ? ?Physical Exam ?Updated Vital Signs ?BP 127/89 (BP Location: Right Arm)   Pulse 74   Temp 98.6 ?F (37 ?C) (Oral)   Resp (!) 28   SpO2 94%  ?Physical Exam ?Vitals and nursing note reviewed.  ?Constitutional:   ?   General: She is not in acute distress. ?   Appearance: She is well-developed. She is not ill-appearing, toxic-appearing or diaphoretic.  ?HENT:  ?   Head: Normocephalic and atraumatic.  ?Eyes:  ?   Extraocular Movements: Extraocular movements intact.  ?   Conjunctiva/sclera: Conjunctivae normal.  ?Neck:  ?   Vascular: No JVD.   ?Cardiovascular:  ?   Rate and Rhythm: Normal rate and regular rhythm.  ?   Heart sounds: Normal heart sounds. No murmur heard. ?Pulmonary:  ?   Effort: Pulmonary effort is normal. No respiratory distress.  ?   Breath sounds: Normal breath sounds. No decreased breath sounds, wheezing, rhonchi or rales.  ?Chest:  ?   Chest wall: No tenderness or edema.  ?Abdominal:  ?   Palpations: Abdomen is soft.  ?   Tenderness: There is no abdominal tenderness.  ?Musculoskeletal:     ?   General: No swelling. Normal range of motion.  ?   Cervical back: Normal range of motion and neck supple.  ?   Right lower leg: No edema.  ?   Left lower leg: No edema.  ?Skin: ?   General: Skin is warm and dry.  ?   Capillary Refill: Capillary refill takes less than 2 seconds.  ?   Coloration: Skin is not cyanotic or pale.  ?Neurological:  ?   General: No focal deficit present.  ?   Mental Status: She is alert and oriented to person, place, and time.  ?Psychiatric:     ?   Mood and Affect: Mood normal.     ?   Behavior: Behavior normal.  ? ? ?ED Results / Procedures / Treatments   ?Labs ?(all labs ordered are listed, but only abnormal results are displayed) ?Labs Reviewed  ?RESP PANEL BY RT-PCR (FLU A&B, COVID) ARPGX2 - Abnormal; Notable for the following components:  ?    Result Value  ? SARS Coronavirus 2 by RT PCR POSITIVE (*)   ? All other components within normal limits  ?COMPREHENSIVE METABOLIC PANEL - Abnormal; Notable for the following components:  ? Potassium 3.2 (*)   ? Creatinine, Ser 1.38 (*)   ? Total Protein 8.5 (*)   ? GFR, Estimated 40 (*)   ? All other components within normal limits  ?CBC WITH DIFFERENTIAL/PLATELET - Abnormal; Notable for the following components:  ? WBC 3.4 (*)   ? RBC 5.34 (*)   ? Hemoglobin 16.0 (*)   ? HCT 50.4 (*)   ? All other components within normal limits  ?MAGNESIUM - Abnormal; Notable for the following components:  ? Magnesium 2.6 (*)   ? All other components within normal limits  ?BRAIN NATRIURETIC  PEPTIDE - Abnormal; Notable for the following components:  ? B Natriuretic Peptide 161.2 (*)   ? All other components within normal limits  ?TSH  ?TROPONIN I (HIGH SENSITIVITY)  ?TROPONIN I (HIGH SENSITIVITY)  ? ? ?EKG ?None ? ?Radiology ?DG Chest Portable 1 View ? ?Result Date: 03/24/2022 ?CLINICAL DATA:  Shortness of breath and cough since yesterday. EXAM: PORTABLE CHEST  1 VIEW COMPARISON:  September 21, 2019 FINDINGS: The heart size and mediastinal contours are stable. The aorta is tortuous. Both lungs are clear. The visualized skeletal structures are unremarkable. IMPRESSION: No active cardiopulmonary disease. Electronically Signed   By: Abelardo Diesel M.D.   On: 03/24/2022 09:05   ? ?Procedures ?Procedures  ? ? ?Medications Ordered in ED ?Medications  ?potassium chloride (KLOR-CON) packet 40 mEq (has no administration in time range)  ?aspirin chewable tablet 324 mg (324 mg Oral Given 03/24/22 0840)  ? ? ?ED Course/ Medical Decision Making/ A&P ?  ?                        ?Medical Decision Making ?Amount and/or Complexity of Data Reviewed ?Labs: ordered. ?Radiology: ordered. ? ?Risk ?OTC drugs. ?Prescription drug management. ? ? ?Patient is a 76 year old female who presents for onset of cough and chest tightness last night.  Differential diagnosis includes URI, pneumonia, exacerbation of undiagnosed reactive airway disease, ACS, PE.  Her evaluation is complicated by medical history of OSA, HTN, CAD, gout, prediabetes, atrial fibrillation, CHF, HLD, and GIST.  On arrival, vital signs notable for moderate hypertension.  On exam, patient is well-appearing.  Lungs are clear to auscultation.  Breathing is unlabored and SPO2 is normal on room air.  Given her history of CAD, including a recent cardiac CT with an elevated calcium score, patient to undergo ACS work-up.  ASA was given.  X-ray was obtained which showed no acute findings.  No evidence of ischemia on EKG.  Patient's lab work showed hypokalemia and replacement  potassium was given in the ED.  BNP was below baseline and troponins were normal.  Patient was found to be positive for COVID-19.  I do think that this explains the patient's symptoms.  Given her comorbidities, patient

## 2022-03-29 ENCOUNTER — Ambulatory Visit (INDEPENDENT_AMBULATORY_CARE_PROVIDER_SITE_OTHER): Payer: Medicare Other | Admitting: Physician Assistant

## 2022-03-29 ENCOUNTER — Encounter: Payer: Self-pay | Admitting: Physician Assistant

## 2022-03-29 VITALS — BP 127/89 | Wt 212.0 lb

## 2022-03-29 DIAGNOSIS — R058 Other specified cough: Secondary | ICD-10-CM

## 2022-03-29 DIAGNOSIS — Z8616 Personal history of COVID-19: Secondary | ICD-10-CM | POA: Diagnosis not present

## 2022-03-29 MED ORDER — BENZONATATE 200 MG PO CAPS: 200.0000 mg | ORAL_CAPSULE | Freq: Three times a day (TID) | ORAL | 0 refills | Status: DC | PRN

## 2022-03-29 MED ORDER — DOXYCYCLINE HYCLATE 100 MG PO TABS: 100.0000 mg | ORAL_TABLET | Freq: Two times a day (BID) | ORAL | 0 refills | Status: DC

## 2022-03-29 MED ORDER — METHYLPREDNISOLONE 4 MG PO TBPK: ORAL_TABLET | ORAL | 0 refills | Status: DC

## 2022-03-29 NOTE — Patient Instructions (Addendum)
You can take an OTC expectorant like guaifenesin or Mucinex to help decrease head and nasal congestion.  ? ?Rest, increase clear fluids, OTC Tylenol (acetamenophen) for body aches, headaches, fever, chills as needed.  ? ?OTC Tylenol for aches, pains, fever ? ? ? ?

## 2022-03-29 NOTE — Progress Notes (Signed)
Start time: 3:10 pm ?End time: 3:30 pm ? ?Virtual Visit via Video Note ? ? Patient ID: Claudia Cantrell, female    DOB: October 30, 1946, 76 y.o.   MRN: [id number] ? ?I connected with above patient on 03/30/22 by a video enabled telemedicine application and verified that I am speaking with the correct person using two identifiers. ? ?Location: ?Patient: phone ?Provider: office ?  ?I discussed the limitations of evaluation and management by telemedicine and the availability of in person appointments. The patient expressed understanding and agreed to proceed. ? ?History of Present Illness: ? ?Chief Complaint  ?Patient presents with  ? Acute Visit  ?  Phone visit- went to ER on 4/15 and was diagnosed with Covid.still feels like she has a lot of congestion in her chest.  ? ?Reports that she was diagnosed with COVID 5 days ago in the ED and took all of the lagevrio prescribed; presents today because she isn't feeling better and isn't feeling worse; cough productive of green and brown phlegm; has a decreased appetite but is drinking fluids; denies headache; denies fever, nausea, vomiting, diarrhea; denies recent travel; OTC Tylenol and Mucinexare helpful; denies tobacco use; denies asthma. ? ? ? ?  ?Observations/Objective: ? ?BP 127/89   Wt 212 lb (96.2 kg)   BMI 35.28 kg/m?  ? ? ?Assessment: ?Encounter Diagnoses  ?Name Primary?  ? Personal history of COVID-19 Yes  ? Cough productive of purulent sputum   ? ? ? ?Plan: ?Rest, increase clear fluids, OTC Tylenol (acetamenophen) for body aches, headaches, fever, chills as needed. OTC expectorant as needed.  ?Tessalon perles, medrol dosepak, and doxycycline  ? ?Claudia Cantrell was seen today for acute visit. ? ?Diagnoses and all orders for this visit: ? ?Personal history of COVID-19 ? ?Cough productive of purulent sputum ? ?Other orders ?-     benzonatate (TESSALON) 200 MG capsule; Take 1 capsule (200 mg total) by mouth 3 (three) times daily as needed for cough. ?-     doxycycline (VIBRA-TABS)  100 MG tablet; Take 1 tablet (100 mg total) by mouth 2 (two) times daily. ?-     methylPREDNISolone (MEDROL DOSEPAK) 4 MG TBPK tablet; Take by mouth as directed. ? ? ? ?Follow up: as already scheduled ? ? ?I discussed the assessment and treatment plan with the patient. The patient was provided an opportunity to ask questions and all were answered. The patient agreed with the plan and demonstrated an understanding of the instructions. ?  ?The patient was advised to call back or seek an in-person evaluation if the symptoms worsen or if the condition fails to improve as anticipated. For emergencies go to Urgent Care or the Emergency Department for immediate evaluation.  ? ?I spent 15 minutes dedicated to the care of this patient, including pre-visit review of records, face to face time, post-visit ordering of testing and documentation. ? ? ? ?Irene Pap, PA-C ?

## 2022-04-18 LAB — HM MAMMOGRAPHY

## 2022-04-20 ENCOUNTER — Encounter: Payer: Self-pay | Admitting: Physician Assistant

## 2022-04-25 ENCOUNTER — Encounter: Payer: Self-pay | Admitting: Physician Assistant

## 2022-04-30 ENCOUNTER — Other Ambulatory Visit: Payer: Self-pay | Admitting: Medical

## 2022-05-09 ENCOUNTER — Ambulatory Visit (INDEPENDENT_AMBULATORY_CARE_PROVIDER_SITE_OTHER): Payer: Medicare Other | Admitting: Physician Assistant

## 2022-05-09 ENCOUNTER — Encounter: Payer: Self-pay | Admitting: Physician Assistant

## 2022-05-09 VITALS — BP 120/80 | HR 44 | Ht 65.0 in | Wt 206.4 lb

## 2022-05-09 DIAGNOSIS — E039 Hypothyroidism, unspecified: Secondary | ICD-10-CM

## 2022-05-09 DIAGNOSIS — Z7901 Long term (current) use of anticoagulants: Secondary | ICD-10-CM

## 2022-05-09 DIAGNOSIS — E782 Mixed hyperlipidemia: Secondary | ICD-10-CM

## 2022-05-09 DIAGNOSIS — N3281 Overactive bladder: Secondary | ICD-10-CM

## 2022-05-09 DIAGNOSIS — R7303 Prediabetes: Secondary | ICD-10-CM

## 2022-05-09 DIAGNOSIS — Z0000 Encounter for general adult medical examination without abnormal findings: Secondary | ICD-10-CM

## 2022-05-09 DIAGNOSIS — E559 Vitamin D deficiency, unspecified: Secondary | ICD-10-CM

## 2022-05-09 MED ORDER — TOLTERODINE TARTRATE 1 MG PO TABS: 1.0000 mg | ORAL_TABLET | Freq: Two times a day (BID) | ORAL | 3 refills | Status: DC

## 2022-05-09 NOTE — Progress Notes (Signed)
Claudia Cantrell is a 76 y.o. female who presents for annual wellness visit and follow-up on chronic medical conditions.    She has the following concerns:  Reports she is generally feeling well; is eating a healthy diet; is sleeping well 6 - 7 hours; drinks about 60 ounces of water a day; is not exercising; Had COVID 03/24/2022; reports urinary frequency and urgency  Immunizations and Health Maintenance Immunization History  Administered Date(s) Administered   Fluad Quad(high Dose 65+) 08/22/2019, 09/20/2021   Influenza Nasal 11/25/2013   Influenza, High Dose Seasonal PF 12/30/2014, 10/14/2017, 11/01/2018   Influenza,inj,Quad PF,6+ Mos 11/25/2013   Influenza-Unspecified 12/30/2012, 11/09/2015, 12/24/2016, 09/16/2020   PFIZER(Purple Top)SARS-COV-2 Vaccination 12/29/2019, 01/18/2020, 09/16/2020   Pfizer Covid-19 Vaccine Bivalent Booster 5y-11y 10/21/2021   Pneumococcal Conjugate-13 12/30/2014, 10/14/2017   Pneumococcal Polysaccharide-23 11/25/2013, 08/22/2019   Zoster Recombinat (Shingrix) 10/14/2017, 01/21/2018   Zoster, Live 12/15/2008   Health Maintenance  Topic Date Due   TETANUS/TDAP  11/06/2022 (Originally 07/02/1965)   COLONOSCOPY (Pts 45-107yr Insurance coverage will need to be confirmed)  01/15/2023 (Originally 07/01/1993)   INFLUENZA VACCINE  07/10/2022   MAMMOGRAM  04/18/2024   Pneumonia Vaccine 76 Years old  Completed   DEXA SCAN  Completed   COVID-19 Vaccine  Completed   Hepatitis C Screening  Completed   Zoster Vaccines- Shingrix  Completed   HPV VACCINES  Aged Out     The 10-year ASCVD risk score (Arnett DK, et al., 2019) is: 8.6%  Dentist: Ophtho: Exercise:  Patient Care Team: WMarcellina Millinas PCP - General (Physician Assistant) KTroy Sine MD as PCP - Cardiology (Cardiology) CConstance Haw MD as PCP - Electrophysiology (Cardiology)   Advanced directives:    Depression screen:  See questionnaire below.     05/09/2022    2:49 PM  05/05/2021    9:23 AM 04/20/2020    1:48 PM 01/14/2019    9:46 AM 01/13/2018    9:39 AM  Depression screen PHQ 2/9  Decreased Interest 0 0 0 0 0  Down, Depressed, Hopeless 0 0 0 1 0  PHQ - 2 Score 0 0 0 1 0    Fall Risk Screen: see questionnaire below.    05/09/2022    2:49 PM 05/05/2021    9:23 AM 04/20/2020    1:48 PM 01/13/2018    9:39 AM 08/29/2016   11:37 AM  Fall Risk   Falls in the past year? 0 0 0 No No  Number falls in past yr: 0 0 0    Injury with Fall? 0 0 0    Risk for fall due to : No Fall Risks No Fall Risks     Follow up Falls evaluation completed Falls evaluation completed       ADL screen:  See questionnaire below Functional Status Survey:     Review of Systems  Constitutional:  Negative for activity change and fever.  HENT:  Negative for congestion, ear pain and voice change.   Eyes:  Negative for redness.  Respiratory:  Negative for cough.   Cardiovascular:  Negative for chest pain.  Gastrointestinal:  Negative for constipation and diarrhea.  Endocrine: Negative for polyuria.  Genitourinary:  Positive for frequency and urgency. Negative for dysuria and flank pain.  Musculoskeletal:  Negative for gait problem and neck stiffness.  Skin:  Negative for color change and rash.  Neurological:  Negative for dizziness.  Hematological:  Negative for adenopathy.  Psychiatric/Behavioral:  Negative for agitation, behavioral  problems and confusion.    BP 120/80   Pulse (!) 44   Ht '5\' 5"'$  (1.651 m)   Wt 206 lb 6.4 oz (93.6 kg)   SpO2 97%   BMI 34.35 kg/m   Physical Exam Vitals and nursing note reviewed.  Constitutional:      General: She is not in acute distress.    Appearance: Normal appearance. She is not ill-appearing.  HENT:     Head: Normocephalic and atraumatic.     Right Ear: Tympanic membrane, ear canal and external ear normal.     Left Ear: Tympanic membrane, ear canal and external ear normal.     Nose: No congestion.  Eyes:     Extraocular Movements:  Extraocular movements intact.     Conjunctiva/sclera: Conjunctivae normal.     Pupils: Pupils are equal, round, and reactive to light.  Neck:     Vascular: No carotid bruit.  Cardiovascular:     Rate and Rhythm: Normal rate and regular rhythm.     Pulses: Normal pulses.     Heart sounds: Normal heart sounds.  Pulmonary:     Effort: Pulmonary effort is normal.     Breath sounds: Normal breath sounds. No wheezing.  Abdominal:     General: Bowel sounds are normal.     Palpations: Abdomen is soft.  Musculoskeletal:        General: Normal range of motion.     Cervical back: Normal range of motion and neck supple.     Right lower leg: No edema.     Left lower leg: No edema.  Skin:    General: Skin is warm and dry.     Findings: No bruising.  Neurological:     General: No focal deficit present.     Mental Status: She is alert and oriented to person, place, and time.  Psychiatric:        Cantrell and Affect: Cantrell normal.        Behavior: Behavior normal.        Thought Content: Thought content normal.    ASSESSMENT/PLAN  Problem List Items Addressed This Visit       Endocrine   Acquired hypothyroidism    stable, continue to take thyroid medicine 1st thing in the morning on an empty stomach with water, but without food or other medicines or supplements          Genitourinary   Overactive bladder    Start detrol 1 mg bid         Other   Vitamin D deficiency    Stable, continue Vitamin D       Relevant Orders   VITAMIN D 25 Hydroxy (Vit-D Deficiency, Fractures) (Completed)   Chronic anticoagulation    Stable, continue current management       Prediabetes    controlled, eat a low sugar diet, avoid starchy food with a lot of carbohydrates, avoid fried and processed foods; last hgb a1c         Relevant Orders   Hemoglobin A1c (Completed)   Mixed hyperlipidemia     controlled, eat a low fat diet, increase fiber intake (Benefiber or Metamucil, Cherrios,  oatmeal,  beans, nuts, fruits and vegetables), limit saturated fats (in fried foods, red meat), can add OTC fish oil supplement, eat fish with Omega-3 fatty acids like salmon and tuna, exercise for 30 minutes 3 - 5 times a week, drink 8 - 10 glasses of water a day  Relevant Orders   Lipid panel (Completed)   Other Visit Diagnoses     Medicare annual wellness visit, subsequent    -  Primary   Relevant Orders   Hemoglobin A1c (Completed)   Lipid panel (Completed)   VITAMIN D 25 Hydroxy (Vit-D Deficiency, Fractures) (Completed)       Discussed monthly self breast exams and yearly mammograms; at least 30 minutes of aerobic activity at least 5 days/week and weight-bearing exercise 2x/week; proper sunscreen use reviewed; healthy diet, including goals of calcium and vitamin D intake and alcohol recommendations (less than or equal to 1 drink/day) reviewed; regular seatbelt use; changing batteries in smoke detectors.  Immunization recommendations discussed.  Colonoscopy recommendations reviewed   Medicare Attestation I have personally reviewed: The patient's medical and social history Their use of alcohol, tobacco or illicit drugs Their current medications and supplements The patient's functional ability including ADLs,fall risks, home safety risks, cognitive, and hearing and visual impairment Diet and physical activities Evidence for depression or Cantrell disorders  The patient's weight, height, and BMI have been recorded in the chart.  I have made referrals, counseling, and provided education to the patient based on review of the above and I have provided the patient with a written personalized care plan for preventive services.    Return in about 4 months (around 09/08/2022) for Return for a Follow Up Appointment Chronic Disease.    Irene Pap, PA-C   05/13/2022

## 2022-05-09 NOTE — Patient Instructions (Signed)
.Preventative Care for Adults - Female      MAINTAIN REGULAR HEALTH EXAMS: A routine yearly physical is a good way to check in with your primary care provider about your health and preventive screening. It is also an opportunity to share updates about your health and any concerns you have, and receive a thorough all-over exam.  Most health insurance companies pay for at least some preventative services.  Check with your health plan for specific coverages.  WHAT PREVENTATIVE SERVICES DO WOMEN NEED? Adult women should have their weight and blood pressure checked regularly.  Women age 35 and older should have their cholesterol levels checked regularly. Women should be screened for cervical cancer with a Pap smear and pelvic exam beginning at either age 21, or 3 years after they become sexually activity.   Breast cancer screening generally begins at age 40 with a mammogram and breast exam by your primary care provider.   Beginning at age 45 and continuing to age 75, women should be screened for colorectal cancer.  Certain people may need continued testing until age 85. Updating vaccinations is part of preventative care.  Vaccinations help protect against diseases such as the flu. Osteoporosis is a disease in which the bones lose minerals and strength as we age. Women ages 65 and over should discuss this with their caregivers, as should women after menopause who have other risk factors. Lab tests are generally done as part of preventative care to screen for anemia and blood disorders, to screen for problems with the kidneys and liver, to screen for bladder problems, to check blood sugar, and to check your cholesterol level. Preventative services generally include counseling about diet, exercise, avoiding tobacco, drugs, excessive alcohol consumption, and sexually transmitted infections.    GENERAL RECOMMENDATIONS FOR GOOD HEALTH:  Healthy diet: Eat a variety of foods, including fruit, vegetables,  animal or vegetable protein, such as meat, fish, chicken, and eggs, or beans, lentils, tofu, and grains, such as rice. Drink plenty of water daily (60 - 80 ounces or 8 - 10 glasses a day) Decrease saturated fat in the diet, avoid lots of red meat, processed foods, sweets, fast foods, and fried foods. For high cholesterol - Increase fiber intake (Benefiber or Metamucil, Cherrios,  oatmeal, beans, nuts, fruits and vegetables), limit saturated fats (in fried foods, red meat), can add OTC fish oil supplement, eat fish with Omega-3 fatty acids like salmon and tuna, exercise for 30 minutes 3 - 5 times a week, drink 8 - 10 glasses of water a day  Exercise: Aerobic exercise helps maintain good heart health. At least 30-40 minutes of moderate-intensity exercise is recommended. For example, a brisk walk that increases your heart rate and breathing. This should be done on most days of the week.  Find a type of exercise or a variety of exercises that you enjoy so that it becomes a part of your daily life.  Examples are running, walking, swimming, water aerobics, and biking.  For motivation and support, explore group exercise such as aerobic class, spin class, Zumba, Yoga,or  martial arts, etc.   Set exercise goals for yourself, such as a certain weight goal, walk or run in a race such as a 5k walk/run.  Speak to your primary care provider about exercise goals.  Disease prevention: If you smoke or chew tobacco, find out from your caregiver how to quit. It can literally save your life, no matter how long you have been a tobacco user. If you do not   use tobacco, never begin.  Maintain a healthy diet and normal weight. Increased weight leads to problems with blood pressure and diabetes.  The Body Mass Index or BMI is a way of measuring how much of your body is fat. Having a BMI above 27 increases the risk of heart disease, diabetes, hypertension, stroke and other problems related to obesity. Your caregiver can help  determine your BMI and based on it develop an exercise and dietary program to help you achieve or maintain this important measurement at a healthful level. High blood pressure causes heart and blood vessel problems.  Persistent high blood pressure should be treated with medicine if weight loss and exercise do not work.  Fat and cholesterol leaves deposits in your arteries that can block them. This causes heart disease and vessel disease elsewhere in your body.  If your cholesterol is found to be high, or if you have heart disease or certain other medical conditions, then you may need to have your cholesterol monitored frequently and be treated with medication.  Ask if you should have a cardiac stress test if your history suggests this. A stress test is a test done on a treadmill that looks for heart disease. This test can find disease prior to there being a problem. Menopause can be associated with physical symptoms and risks. Hormone replacement therapy is available to decrease these. You should talk to your caregiver about whether starting or continuing to take hormones is right for you.  Osteoporosis is a disease in which the bones lose minerals and strength as we age. This can result in serious bone fractures. Risk of osteoporosis can be identified using a bone density scan. Women ages 65 and over should discuss this with their caregivers, as should women after menopause who have other risk factors. Ask your caregiver whether you should be taking a calcium supplement and Vitamin D, to reduce the rate of osteoporosis.  Avoid drinking alcohol in excess (more than two drinks per day).  Avoid use of street drugs. Do not share needles with anyone. Ask for professional help if you need assistance or instructions on stopping the use of alcohol, cigarettes, and/or drugs. Brush your teeth twice a day with fluoride toothpaste, and floss once a day. Good oral hygiene prevents tooth decay and gum disease. The  problems can be painful, unattractive, and can cause other health problems. Visit your dentist for a routine oral and dental check up and preventive care every 6-12 months.  Look at your skin regularly.  Use a mirror to look at your back. Notify your caregivers of changes in moles, especially if there are changes in shapes, colors, a size larger than a pencil eraser, an irregular border, or development of new moles.  Safety: Use seatbelts 100% of the time, whether driving or as a passenger.  Use safety devices such as hearing protection if you work in environments with loud noise or significant background noise.  Use safety glasses when doing any work that could send debris in to the eyes.  Use a helmet if you ride a bike or motorcycle.  Use appropriate safety gear for contact sports.  Talk to your caregiver about gun safety. Use sunscreen with a SPF (or skin protection factor) of 15 or greater.  Lighter skinned people are at a greater risk of skin cancer. Don't forget to also wear sunglasses in order to protect your eyes from too much damaging sunlight. Damaging sunlight can accelerate cataract formation.  Practice safe sex. Use   condoms. Condoms are used for birth control and to help reduce the spread of sexually transmitted infections (or STIs).  Some of the STIs are gonorrhea (the clap), chlamydia, syphilis, trichomonas, herpes, HPV (human papilloma virus) and HIV (human immunodeficiency virus) which causes AIDS. The herpes, HIV and HPV are viral illnesses that have no cure. These can result in disability, cancer and death.  Keep carbon monoxide and smoke detectors in your home functioning at all times. Change the batteries every 6 months or use a model that plugs into the wall.   Vaccinations: Stay up to date with your tetanus shots and other required immunizations. You should have a booster for tetanus every 10 years. Be sure to get your flu shot every year, since 5%-20% of the [place] population comes  down with the flu. The flu vaccine changes each year, so being vaccinated once is not enough. Get your shot in the fall, before the flu season peaks.   Other vaccines to consider: Human Papilloma Virus or HPV causes cancer of the cervix, and other infections that can be transmitted from person to person. There is a vaccine for HPV, and females should get immunized between the ages of 11 and 26. It requires a series of 3 shots.  Pneumococcal vaccine to protect against certain types of pneumonia.  This is normally recommended for adults age 65 or older.  However, adults younger than 74 years old with certain underlying conditions such as diabetes, heart or lung disease should also receive the vaccine. Shingles vaccine to protect against Varicella Zoster if you are older than age 60, or younger than 74 years old with certain underlying illness. Hepatitis A vaccine to protect against a form of infection of the liver by a virus acquired from food. Hepatitis B vaccine to protect against a form of infection of the liver by a virus acquired from blood or body fluids, particularly if you work in health care. If you plan to travel internationally, check with your local health department for specific vaccination recommendations.  Cancer Screening: Breast cancer screening is essential to preventive care for women. All women age 20 and older should perform a breast self-exam every month. At age 40 and older, women should have their caregiver complete a breast exam each year. Women at ages 40 and older should have a mammogram (x-ray film) of the breasts. Your caregiver can discuss how often you need mammograms.   Cervical cancer screening includes taking a Pap smear (sample of cells examined under a microscope) from the cervix (end of the uterus). It also includes testing for HPV (Human Papilloma Virus, which can cause cervical cancer). Screening and a pelvic exam should begin at age 21, or 3 years after a woman  becomes sexually active. Screening should occur every year, with a Pap smear but no HPV testing, up to age 30. After age 30, you should have a Pap smear every 3 years with HPV testing, if no HPV was found previously.  Most routine colon cancer screening begins at the age of 50. On a yearly basis, doctors may provide special easy to use take-home tests to check for hidden blood in the stool. Sigmoidoscopy or colonoscopy can detect the earliest forms of colon cancer and is life saving. These tests use a small camera at the end of a tube to directly examine the colon. Speak to your caregiver about this at age 50, when routine screening begins (and is repeated every 5 years unless early forms of pre-cancerous polyps   or small growths are found).    

## 2022-05-10 LAB — LIPID PANEL
Chol/HDL Ratio: 3.4 ratio (ref 0.0–4.4)
Cholesterol, Total: 141 mg/dL (ref 100–199)
HDL: 42 mg/dL (ref >39)
LDL Chol Calc (NIH): 78 mg/dL (ref 0–99)
Triglycerides: 116 mg/dL (ref 0–149)
VLDL Cholesterol Cal: 21 mg/dL (ref 5–40)

## 2022-05-10 LAB — HEMOGLOBIN A1C
Est. average glucose Bld gHb Est-mCnc: 128 mg/dL
Hgb A1c MFr Bld: 6.1 % — ABNORMAL HIGH (ref 4.8–5.6)

## 2022-05-10 LAB — VITAMIN D 25 HYDROXY (VIT D DEFICIENCY, FRACTURES): Vit D, 25-Hydroxy: 55.8 ng/mL (ref 30.0–100.0)

## 2022-05-12 ENCOUNTER — Other Ambulatory Visit: Payer: Self-pay | Admitting: Cardiology

## 2022-05-12 DIAGNOSIS — I4819 Other persistent atrial fibrillation: Secondary | ICD-10-CM

## 2022-05-13 ENCOUNTER — Encounter: Payer: Self-pay | Admitting: Physician Assistant

## 2022-05-13 DIAGNOSIS — N3281 Overactive bladder: Secondary | ICD-10-CM | POA: Insufficient documentation

## 2022-05-13 NOTE — Assessment & Plan Note (Signed)
Stable, continue Vitamin D

## 2022-05-13 NOTE — Assessment & Plan Note (Signed)
controlled, eat a low sugar diet, avoid starchy food with a lot of carbohydrates, avoid fried and processed foods; last hgb a1c

## 2022-05-13 NOTE — Progress Notes (Signed)
Vitamin D level is normal  Cholesterol levels are fine, continue to eat a low fat diet  Blood sugar levels are fine, continue to eat a low sugar diet

## 2022-05-13 NOTE — Assessment & Plan Note (Signed)
Start detrol 1 mg bid

## 2022-05-13 NOTE — Assessment & Plan Note (Signed)
stable, continue to take thyroid medicine 1st thing in the morning on an empty stomach with water, but without food or other medicines or supplements

## 2022-05-13 NOTE — Assessment & Plan Note (Signed)
controlled, eat a low fat diet, increase fiber intake (Benefiber or Metamucil, Cherrios,  oatmeal, beans, nuts, fruits and vegetables), limit saturated fats (in fried foods, red meat), can add OTC fish oil supplement, eat fish with Omega-3 fatty acids like salmon and tuna, exercise for 30 minutes 3 - 5 times a week, drink 8 - 10 glasses of water a day ? ? ?

## 2022-05-13 NOTE — Assessment & Plan Note (Signed)
Stable, continue current management 

## 2022-05-14 NOTE — Telephone Encounter (Signed)
Prescription refill request for Eliquis received. Indication: Atrial Fib Last office visit: 01/10/22  Claudina Lick PA-C Scr: 1.11 on 04/20/22 Age: 76 Weight: 95.3  Based on above findings Eliquis '5mg'$  twice daily is the appropriate dose.  Refill approved.

## 2022-05-15 ENCOUNTER — Ambulatory Visit (INDEPENDENT_AMBULATORY_CARE_PROVIDER_SITE_OTHER): Payer: Medicare Other | Admitting: Cardiovascular Disease

## 2022-05-15 ENCOUNTER — Encounter: Payer: Self-pay | Admitting: Cardiovascular Disease

## 2022-05-15 VITALS — BP 140/82 | HR 87 | Ht 65.0 in | Wt 202.0 lb

## 2022-05-15 DIAGNOSIS — E785 Hyperlipidemia, unspecified: Secondary | ICD-10-CM | POA: Diagnosis not present

## 2022-05-15 DIAGNOSIS — I4819 Other persistent atrial fibrillation: Secondary | ICD-10-CM

## 2022-05-15 DIAGNOSIS — G4733 Obstructive sleep apnea (adult) (pediatric): Secondary | ICD-10-CM | POA: Diagnosis not present

## 2022-05-15 DIAGNOSIS — Z9989 Dependence on other enabling machines and devices: Secondary | ICD-10-CM | POA: Diagnosis not present

## 2022-05-15 DIAGNOSIS — I2510 Atherosclerotic heart disease of native coronary artery without angina pectoris: Secondary | ICD-10-CM | POA: Diagnosis not present

## 2022-05-15 DIAGNOSIS — Z9861 Coronary angioplasty status: Secondary | ICD-10-CM

## 2022-05-15 DIAGNOSIS — Z7901 Long term (current) use of anticoagulants: Secondary | ICD-10-CM

## 2022-05-15 NOTE — Progress Notes (Signed)
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 fibrillation.  She was completely unaware of this.  Retrospectively, she was in atrial fibrillation when she was evaluated at [facility] ER on March 24, 2022 and was not made aware of this.,  He has noticed retrospectively that she has been more fatigued over the past 2 months which most likely is related to her recurrent persistent atrial fibrillation.  She is on Eliquis for anticoagulation at 5 mg twice a day with her mild blood pressure elevation, I have recommended further titration of carvedilol from 12.5 mg twice a day to 18.75 mg twice a day for the next week and if heart rate is still in the 80s or above and she is in atrial fibrillation to further titrate to 25 mg twice a day.  She is anticoagulated on Eliquis 5 mg.  She continues to be on amlodipine 7.5 mg, losartan 25 mg  daily, and furosemide 20 mg daily in addition to isosorbide dinitrate 5 mg twice a day.  She is on levothyroxine for hypothyroidism currently at 88 mcg and is on Farxiga 10 mg.  She is on rosuvastatin 40 mg daily.  I will schedule her for an appointment for reevaluation with Dr. Curt Bears and if unable to then see atrial fibrillation clinic in light of her recurrent AF.  I will see her  in 3 to 4 months in follow-up of receiving her new CPAP machine.    Troy Sine, MD, [facility]  05/15/2022 1:39 PM

## 2022-05-15 NOTE — Patient Instructions (Signed)
Medication Instructions:   Increase Carvedilol to 1.5 tablets twice a day for 1 week, if HR continues to be elevated, increase to 25 mg twice a day.   *If you need a refill on your cardiac medications before your next appointment, please call your pharmacy*   Follow-Up: At [facility], you and your health needs are our priority.  As part of our continuing mission to provide you with exceptional heart care, we have created designated Provider Care Teams.  These Care Teams include your primary Cardiologist (physician) and Advanced Practice Providers (APPs -  Physician Assistants and Nurse Practitioners) who all work together to provide you with the care you need, when you need it.  We recommend signing up for the patient portal called "MyChart".  Sign up information is provided on this After Visit Summary.  MyChart is used to connect with patients for Virtual Visits (Telemedicine).  Patients are able to view lab/test results, encounter notes, upcoming appointments, etc.  Non-urgent messages can be sent to your provider as well.   To learn more about what you can do with MyChart, go to [email or web address].    Your next appointment:   After you get your new machine (let us know)  The format for your next appointment:   In Person  Provider:   Shelva Majestic, MD  (sleep)     Other Instructions We will reach out to [facility] office this week to see if we can get you in for a follow up. They will contact you.

## 2022-05-21 ENCOUNTER — Ambulatory Visit (HOSPITAL_COMMUNITY)
Admission: RE | Admit: 2022-05-21 | Discharge: 2022-05-21 | Disposition: A | Discharge status: Home / Self Care | Payer: Medicare Other | Source: Ambulatory Visit | Attending: Physician Assistant | Admitting: Physician Assistant

## 2022-05-21 ENCOUNTER — Encounter (HOSPITAL_COMMUNITY): Payer: Self-pay | Admitting: Physician Assistant

## 2022-05-21 VITALS — BP 122/70 | HR 78 | Ht 65.0 in | Wt 204.0 lb

## 2022-05-21 DIAGNOSIS — I4819 Other persistent atrial fibrillation: Secondary | ICD-10-CM | POA: Insufficient documentation

## 2022-05-21 DIAGNOSIS — I129 Hypertensive chronic kidney disease with stage 1 through stage 4 chronic kidney disease, or unspecified chronic kidney disease: Secondary | ICD-10-CM | POA: Insufficient documentation

## 2022-05-21 DIAGNOSIS — N189 Chronic kidney disease, unspecified: Secondary | ICD-10-CM | POA: Diagnosis not present

## 2022-05-21 DIAGNOSIS — I2510 Atherosclerotic heart disease of native coronary artery without angina pectoris: Secondary | ICD-10-CM | POA: Insufficient documentation

## 2022-05-21 DIAGNOSIS — D6869 Other thrombophilia: Secondary | ICD-10-CM

## 2022-05-21 DIAGNOSIS — Z7901 Long term (current) use of anticoagulants: Secondary | ICD-10-CM | POA: Insufficient documentation

## 2022-05-21 DIAGNOSIS — G4733 Obstructive sleep apnea (adult) (pediatric): Secondary | ICD-10-CM | POA: Diagnosis not present

## 2022-05-21 LAB — BASIC METABOLIC PANEL
Anion gap: 9 (ref 5–15)
BUN: 22 mg/dL (ref 8–23)
CO2: 27 mmol/L (ref 22–32)
Calcium: 10.1 mg/dL (ref 8.9–10.3)
Chloride: 106 mmol/L (ref 98–111)
Creatinine, Ser: 1.04 mg/dL — ABNORMAL HIGH (ref 0.44–1.00)
GFR, Estimated: 57 mL/min — ABNORMAL LOW (ref >60)
Glucose, Bld: 98 mg/dL (ref 70–99)
Potassium: 3.6 mmol/L (ref 3.5–5.1)
Sodium: 142 mmol/L (ref 135–145)

## 2022-05-21 LAB — CBC
HCT: 48.3 % — ABNORMAL HIGH (ref 36.0–46.0)
Hemoglobin: 15.6 g/dL — ABNORMAL HIGH (ref 12.0–15.0)
MCH: 31.4 pg (ref 26.0–34.0)
MCHC: 32.3 g/dL (ref 30.0–36.0)
MCV: 97.2 fL (ref 80.0–100.0)
Platelets: 195 10*3/uL (ref 150–400)
RBC: 4.97 MIL/uL (ref 3.87–5.11)
RDW: 15 % (ref 11.5–15.5)
WBC: 4.8 10*3/uL (ref 4.0–10.5)
nRBC: 0 % (ref 0.0–0.2)

## 2022-05-21 NOTE — Patient Instructions (Signed)
Night before cardioversion - go back to normal dosing of coreg  Cardioversion scheduled for Thursday, June 22nd  - Arrive at the [facility] and go to admitting at 930am  - Do not eat or drink anything after midnight the night prior to your procedure.  - Take all your morning medication (except diabetic medications) with a sip of water prior to arrival.  - You will not be able to drive home after your procedure.  - Do NOT miss any doses of your blood thinner - if you should miss a dose please notify our office immediately.  - If you feel as if you go back into normal rhythm prior to scheduled cardioversion, please notify our office immediately. If your procedure is canceled in the cardioversion suite you will be charged a cancellation fee.

## 2022-05-21 NOTE — H&P (View-Only) (Signed)
Primary Care Physician: Marcellina Millin Referring Physician: Dr. Roxine Caddy Claudia Cantrell is a 76 y.o. female with a h/o atrial fibrillation, CAD, OSA, HTN who presents for follow up in the [facility]. Patient is s/p afib ablation with Dr Curt Bears on 07/28/21. She had done well with no know episodes of afib until she presented to the ED on 03/24/22 with chest pain. She tested positive for COVID at that time. ECG showed rate controlled afib. Seen by Dr Claiborne Billings on 05/15/22 and she was still in afib, her carvedilol was increased.   On follow up today, patient reports that she remains fatigued with exertion despite good rate control. She denies any bleeding issues on anticoagulation.   Today, she denies symptoms of palpitations, chest pain, shortness of breath, orthopnea, PND, lower extremity edema, dizziness, presyncope, syncope, or neurologic sequela. The patient is tolerating medications without difficulties and is otherwise without complaint today.   Past Medical History:  Diagnosis Date   Arthritis    rheumatoid   CAD (coronary artery disease)    a. 1992 s/p MI and PTCA of unknown vessel;  b. 09/2010 Cath: LM nl, LAD 20p, LCX 43m RCA 138mRPL 20.   Cervical cancer ([place]   Chronic kidney disease    Family history of anesthesia complication    daughter has difficulty waking    GERD (gastroesophageal reflux disease)    occ   Gout 10/08/2016   Hyperlipidemia    Hypertensive heart disease    Hypothyroidism    Nonischemic cardiomyopathy ([place]   a. 07/2016 Echo: EF 40-45%, mild LVH, inferior akinesis, moderately dilated left atrium, trivial AI and MR.   PAF (paroxysmal atrial fibrillation) ([place]   a. 07/2016 Admitted w/ AF RVR-->CHA2DS2VASc = 5-->Eliquis;  b. 07/2016 successful TEE/DCCV.   Sleep apnea    a. Using CPAP.   Past Surgical History:  Procedure Laterality Date   ABDOMINAL HYSTERECTOMY  1988   ATRIAL FIBRILLATION ABLATION N/A 07/28/2021    Procedure: ATRIAL FIBRILLATION ABLATION;  Surgeon: CaConstance HawMD;  [facility];  Service: Cardiovascular;  Laterality: N/A;   [place]  09/02/2019   Procedure: BIOPSY;  Surgeon: MaRush LandmarkaTelford Nab MD;  [facility];  Service: Gastroenterology;;   BIOPSY  12/09/2019   Procedure: BIOPSY;  Surgeon: MaIrving Copas MD;  [facility];  Service: Gastroenterology;;   BIOPSY  03/08/2021   Procedure: BIOPSY;  Surgeon: MaIrving Copas MD;  [facility];  Service: Gastroenterology;;   [place] BY [place]/A 08/01/2016   Procedure: CARDIOVERSION;  Surgeon: BrLelon PerlaMD;  [facility];  Service: Cardiovascular;  Laterality: N/A;   CARDIOVERSION N/A 01/11/2020   Procedure: CARDIOVERSION;  Surgeon: NiJosue HectorMD;  [place] [facility] [place] Service: Cardiovascular;  Laterality: N/A;   CARDIOVERSION N/A 07/08/2020   Procedure: CARDIOVERSION;  Surgeon: ScDonato HeinzMD;  [facility] Service: Cardiovascular;  Laterality: N/A;   ENDOSCOPIC MUCOSAL RESECTION N/A 12/09/2019   Procedure: ENDOSCOPIC MUCOSAL RESECTION;  Surgeon: MaIrving Copas MD;  [facility];  Service: Gastroenterology;  Laterality: N/A;   ESOPHAGOGASTRODUODENOSCOPY N/A 03/08/2021   Procedure: ESOPHAGOGASTRODUODENOSCOPY (EGD);  Surgeon: MaIrving Copas MD;  [facility];  Service: Gastroenterology;  Laterality: N/A;   ESOPHAGOGASTRODUODENOSCOPY (EGD) WITH PROPOFOL N/A 09/02/2019   Procedure: ESOPHAGOGASTRODUODENOSCOPY (  EGD) WITH PROPOFOL;  Surgeon: Mansouraty, Telford Nab., MD;  [facility];  Service: Gastroenterology;  Laterality: N/A;   ESOPHAGOGASTRODUODENOSCOPY (EGD) WITH PROPOFOL N/A 12/09/2019   Procedure: ESOPHAGOGASTRODUODENOSCOPY (EGD) WITH PROPOFOL;  Surgeon: Rush Landmark Telford Nab., MD;   [facility];  Service: Gastroenterology;  Laterality: N/A;   EUS N/A 09/02/2019   Procedure: UPPER ENDOSCOPIC ULTRASOUND (EUS) RADIAL;  Surgeon: Irving Copas., MD;  [facility];  Service: Gastroenterology;  Laterality: N/A;  EUS radial/linear   EUS N/A 12/09/2019   Procedure: UPPER ENDOSCOPIC ULTRASOUND (EUS) RADIAL;  Surgeon: Irving Copas., MD;  [facility];  Service: Gastroenterology;  Laterality: N/A;   EUS  12/09/2019   Procedure: UPPER ENDOSCOPIC ULTRASOUND (EUS) LINEAR;  Surgeon: Irving Copas., MD;  [facility];  Service: Gastroenterology;;   EUS N/A 03/08/2021   Procedure: UPPER ENDOSCOPIC ULTRASOUND (EUS) RADIAL;  Surgeon: Irving Copas., MD;  [facility];  Service: Gastroenterology;  Laterality: N/A;   FINE NEEDLE ASPIRATION  09/02/2019   Procedure: FINE NEEDLE ASPIRATION (FNA) LINEAR;  Surgeon: Irving Copas., MD;  [facility];  Service: Gastroenterology;;   FINE NEEDLE ASPIRATION  12/09/2019   Procedure: FINE NEEDLE ASPIRATION (FNA) LINEAR;  Surgeon: Irving Copas., MD;  [facility];  Service: Gastroenterology;;   HEMOSTASIS CLIP PLACEMENT  12/09/2019   Procedure: HEMOSTASIS CLIP PLACEMENT;  Surgeon: Irving Copas., MD;  [facility];  Service: Gastroenterology;;   ORIF ANKLE FRACTURE Right 04/27/2015   Procedure: OPEN REDUCTION INTERNAL FIXATION (ORIF) RIGHT BIMALLEOLAR ANKLE FRACTURE WITH SYNDESMOSIS FIXATION;  Surgeon: Leandrew Koyanagi, MD;  Location: [place];  Service: Orthopedics;  Laterality: Right;   SUBMUCOSAL LIFTING INJECTION  12/09/2019   Procedure: SUBMUCOSAL LIFTING INJECTION;  Surgeon: Irving Copas., MD;  [facility];  Service: Gastroenterology;;   TEE WITHOUT CARDIOVERSION N/A 08/01/2016   Procedure: TRANSESOPHAGEAL ECHOCARDIOGRAM (TEE);  Surgeon: Lelon Perla, MD;  Location[facility] ENDOSCOPY;  Service:  Cardiovascular;  Laterality: N/A;   THYROIDECTOMY  11/24/2013   DR Dalbert Batman   THYROIDECTOMY N/A 11/24/2013   Procedure: TOTAL THYROIDECTOMY;  Surgeon: Adin Hector, MD;  Location: [place];  Service: General;  Laterality: N/A;   TRANSTHORACIC ECHOCARDIOGRAM  01/15/2013   EF 55% TO 65%. PROBABLE MILD HYPOKINESIS OF THE INFERIOR MYOCARDIUM. GRADE 1 DIASTOLIC DYSFUNCTION. TRIAL AR.LA IS MILDLY DILATED.    Current Outpatient Medications  Medication Sig Dispense Refill   acetaminophen (TYLENOL) 650 MG CR tablet Take 1,300 mg by mouth every 8 (eight) hours as needed for pain.      allopurinol (ZYLOPRIM) 100 MG tablet Take 1 tablet (100 mg total) by mouth in the morning. 90 tablet 0   amLODipine (NORVASC) 5 MG tablet TAKE 1 & 1/2 (ONE & ONE-HALF) TABLETS BY MOUTH IN THE EVENING 135 tablet 1   apixaban (ELIQUIS) 5 MG TABS tablet Take 1 tablet by mouth twice daily 180 tablet 1   carvedilol (COREG) 12.5 MG tablet Take 1 tablet (12.5 mg total) by mouth 2 (two) times daily. 180 tablet 3   FARXIGA 10 MG TABS tablet Take 10 mg by mouth in the morning.     furosemide (LASIX) 20 MG tablet Take 20 mg by mouth daily.     isosorbide dinitrate (ISORDIL) 5 MG tablet Take 1 tablet (5 mg total) by mouth 2 (two) times daily. 180 tablet 3   losartan (COZAAR) 25 MG tablet Take 1 tablet (25 mg total) by mouth at bedtime. 90 tablet 3  Magnesium 400 MG TABS Take 400 mg by mouth every evening.     Multiple Vitamins-Minerals (MULTIVITAMIN WITH MINERALS) tablet Take 1 tablet by mouth 3 (three) times a week.     Probiotic Product (PROBIOTIC PO) Take 1 capsule by mouth at bedtime.     rosuvastatin (CRESTOR) 40 MG tablet Take 1 tablet by mouth once daily 90 tablet 3   SYNTHROID 88 MCG tablet Take 1 tablet (88 mcg total) by mouth daily before breakfast. 90 tablet 3   tolterodine (DETROL) 1 MG tablet Take 1 tablet (1 mg total) by mouth 2 (two) times daily. 60 tablet 3   Vitamin D3 (VITAMIN D) 25 MCG tablet Take 1,000 Units by  mouth daily in the afternoon.     zinc gluconate 50 MG tablet Take 50 mg by mouth daily in the afternoon.     No current facility-administered medications for this encounter.    Allergies  Allergen Reactions   Labetalol     headaches   Codeine Anxiety    Social History   Socioeconomic History   Marital status: Widowed    Spouse name: Not on file   Number of children: 3   Years of education: Not on file   Highest education level: Not on file  Occupational History   Occupation: retired  Tobacco Use   Smoking status: Former    Packs/day: 1.00    Years: 15.00    Total pack years: 15.00    Types: Cigarettes    Quit date: 12/10/1990    Years since quitting: 31.4    Passive exposure: Never   Smokeless tobacco: Never   Tobacco comments:    Former smoker 05/21/22  Vaping Use   Vaping Use: Never used  Substance and Sexual Activity   Alcohol use: Yes    Comment: very rare   Drug use: No   Sexual activity: Not on file  Other Topics Concern   Not on file  Social History Narrative   Right handed    Living  alone.   Social Determinants of Health   Financial Resource Strain: Not on file  Food Insecurity: Not on file  Transportation Needs: Not on file  Physical Activity: Not on file  Stress: Not on file  Social Connections: Not on file  Intimate Partner Violence: Not on file    Family History  Problem Relation Age of Onset   Heart disease Mother    Sudden death Mother    Diabetes Father    Stroke Father    Bone cancer Sister    Sudden death Brother    Melanoma Brother    Heart disease Brother    Colon cancer Neg Hx    Esophageal cancer Neg Hx    Inflammatory bowel disease Neg Hx    Liver disease Neg Hx    Pancreatic cancer Neg Hx    Rectal cancer Neg Hx    Stomach cancer Neg Hx     ROS- All systems are reviewed and negative except as per the HPI above  Physical Exam: Vitals:   05/21/22 1417  BP: 122/70  Pulse: 78  Weight: 92.5 kg  Height: '5\' 5"'$   (1.651 m)   Wt Readings from Last 3 Encounters:  05/21/22 92.5 kg  05/15/22 91.6 kg  05/09/22 93.6 kg    Labs: Lab Results  Component Value Date   NA 140 03/24/2022   K 3.2 (L) 03/24/2022   CL 100 03/24/2022   CO2 30 03/24/2022   GLUCOSE  98 03/24/2022   BUN 17 03/24/2022   CREATININE 1.38 (H) 03/24/2022   CALCIUM 10.1 03/24/2022   MG 2.6 (H) 03/24/2022   Lab Results  Component Value Date   INR 1.3 (H) 08/18/2019   Lab Results  Component Value Date   CHOL 141 05/09/2022   HDL 42 05/09/2022   LDLCALC 78 05/09/2022   TRIG 116 05/09/2022     GEN- The patient is a well appearing obese female, alert and oriented x 3 today.   HEENT-head normocephalic, atraumatic, sclera clear, conjunctiva pink, hearing intact, trachea midline. Lungs- Clear to ausculation bilaterally, normal work of breathing Heart- irregular rate and rhythm, no murmurs, rubs or gallops  GI- soft, NT, ND, + BS Extremities- no clubbing, cyanosis, or edema MS- no significant deformity or atrophy Skin- no rash or lesion Psych- euthymic mood, full affect Neuro- strength and sensation are intact   EKG- Afib Vent. rate 78 BPM PR interval * ms QRS duration 86 ms QT/QTcB 332/378 ms   Epic records reviewed    Assessment and Plan:  1. Persistent Afib S/p afib ablation 07/28/21 Patient back in rate controlled afib. Suspect 2/2 recent COVID infection. We discussed rhythm control options today. Will plan for DCCV. Check bmet/cbc today. Continue carvedilol 25 mg BID until night prior to DCCV then decrease back to 12.5 mg BID. Continue Eliquis 5 mg BID, patient denies any missed doses in the past 3 weeks.   2. CHA2DS2VASc of at least 5 Continue Eliquis as above.  3. HTN Stable, no changes today.  4. CAD No anginal symptoms.  5. OSA The importance of adequate treatment of sleep apnea was discussed today in order to improve our ability to maintain sinus rhythm long term. Encouraged compliance with  CPAP therapy. Followed by Dr Claiborne Billings.   Follow up in the AF clinic post DCCV.    [facility] [address] [place], [place] [address] [phone]

## 2022-05-21 NOTE — Progress Notes (Signed)
Primary Care Physician: Marcellina Millin Referring Physician: Dr. Roxine Caddy Claudia Cantrell is a 76 y.o. female with a h/o atrial fibrillation, CAD, OSA, HTN who presents for follow up in the [facility]. Patient is s/p afib ablation with Dr Curt Bears on 07/28/21. She had done well with no know episodes of afib until she presented to the ED on 03/24/22 with chest pain. She tested positive for COVID at that time. ECG showed rate controlled afib. Seen by Dr Claiborne Billings on 05/15/22 and she was still in afib, her carvedilol was increased.   On follow up today, patient reports that she remains fatigued with exertion despite good rate control. She denies any bleeding issues on anticoagulation.   Today, she denies symptoms of palpitations, chest pain, shortness of breath, orthopnea, PND, lower extremity edema, dizziness, presyncope, syncope, or neurologic sequela. The patient is tolerating medications without difficulties and is otherwise without complaint today.   Past Medical History:  Diagnosis Date   Arthritis    rheumatoid   CAD (coronary artery disease)    a. 1992 s/p MI and PTCA of unknown vessel;  b. 09/2010 Cath: LM nl, LAD 20p, LCX 68m RCA 138mRPL 20.   [place]   Chronic kidney disease    Family history of anesthesia complication    daughter has difficulty waking    GERD (gastroesophageal reflux disease)    occ   Gout 10/08/2016   Hyperlipidemia    Hypertensive heart disease    Hypothyroidism    Nonischemic cardiomyopathy (HCE[place]   a. 07/2016 Echo: EF 40-45%, mild LVH, inferior akinesis, moderately dilated left atrium, trivial AI and MR.   PAF (paroxysmal atrial fibrillation) ([facility]   a. 07/2016 Admitted w/ AF RVR-->CHA2DS2VASc = 5-->Eliquis;  b. 07/2016 successful TEE/DCCV.   Sleep apnea    a. Using CPAP.   Past Surgical History:  Procedure Laterality Date   ABDOMINAL HYSTERECTOMY  1988   ATRIAL FIBRILLATION ABLATION N/A 07/28/2021    Procedure: ATRIAL FIBRILLATION ABLATION;  Surgeon: CaConstance HawMD;  [facility]: [facility];  Service: Cardiovascular;  Laterality: N/A;   [place] BI[place]  09/02/2019   Procedure: BIOPSY;  Surgeon: MaRush LandmarkaTelford Nab MD;  [facility];  Service: Gastroenterology;;   BIOPSY  12/09/2019   Procedure: BIOPSY;  Surgeon: MaIrving Copas MD;  [facility];  Service: Gastroenterology;;   BIOPSY  03/08/2021   Procedure: BIOPSY;  Surgeon: MaIrving Copas MD;  [facility];  Service: Gastroenterology;;   [place]/A 08/01/2016   Procedure: CARDIOVERSION;  Surgeon: BrLelon PerlaMD;  [facility];  Service: Cardiovascular;  Laterality: N/A;   CARDIOVERSION N/A 01/11/2020   Procedure: CARDIOVERSION;  Surgeon: NiJosue HectorMD;  [facility]: [facility] Service: Cardiovascular;  Laterality: N/A;   CARDIOVERSION N/A 07/08/2020   Procedure: CARDIOVERSION;  Surgeon: ScDonato HeinzMD;  [facility] Service: Cardiovascular;  Laterality: N/A;   ENDOSCOPIC MUCOSAL RESECTION N/A 12/09/2019   Procedure: ENDOSCOPIC MUCOSAL RESECTION;  Surgeon: MaIrving Copas MD;  [facility];  Service: Gastroenterology;  Laterality: N/A;   ESOPHAGOGASTRODUODENOSCOPY N/A 03/08/2021   Procedure: ESOPHAGOGASTRODUODENOSCOPY (EGD);  Surgeon: MaIrving Copas MD;  [facility];  Service: Gastroenterology;  Laterality: N/A;   ESOPHAGOGASTRODUODENOSCOPY (EGD) WITH PROPOFOL N/A 09/02/2019   Procedure: ESOPHAGOGASTRODUODENOSCOPY (  EGD) WITH PROPOFOL;  Surgeon: Mansouraty, Telford Nab., MD;  [facility];  Service: Gastroenterology;  Laterality: N/A;   ESOPHAGOGASTRODUODENOSCOPY (EGD) WITH PROPOFOL N/A 12/09/2019   Procedure: ESOPHAGOGASTRODUODENOSCOPY (EGD) WITH PROPOFOL;  Surgeon: Rush Landmark Telford Nab., MD;   [facility];  Service: Gastroenterology;  Laterality: N/A;   EUS N/A 09/02/2019   Procedure: UPPER ENDOSCOPIC ULTRASOUND (EUS) RADIAL;  Surgeon: Irving Copas., MD;  [facility];  Service: Gastroenterology;  Laterality: N/A;  EUS radial/linear   EUS N/A 12/09/2019   Procedure: UPPER ENDOSCOPIC ULTRASOUND (EUS) RADIAL;  Surgeon: Irving Copas., MD;  [facility];  Service: Gastroenterology;  Laterality: N/A;   EUS  12/09/2019   Procedure: UPPER ENDOSCOPIC ULTRASOUND (EUS) LINEAR;  Surgeon: Irving Copas., MD;  [facility];  Service: Gastroenterology;;   EUS N/A 03/08/2021   Procedure: UPPER ENDOSCOPIC ULTRASOUND (EUS) RADIAL;  Surgeon: Irving Copas., MD;  [facility];  Service: Gastroenterology;  Laterality: N/A;   FINE NEEDLE ASPIRATION  09/02/2019   Procedure: FINE NEEDLE ASPIRATION (FNA) LINEAR;  Surgeon: Irving Copas., MD;  [facility];  Service: Gastroenterology;;   FINE NEEDLE ASPIRATION  12/09/2019   Procedure: FINE NEEDLE ASPIRATION (FNA) LINEAR;  Surgeon: Irving Copas., MD;  [facility];  Service: Gastroenterology;;   HEMOSTASIS CLIP PLACEMENT  12/09/2019   Procedure: HEMOSTASIS CLIP PLACEMENT;  Surgeon: Irving Copas., MD;  [facility];  Service: Gastroenterology;;   ORIF ANKLE FRACTURE Right 04/27/2015   Procedure: OPEN REDUCTION INTERNAL FIXATION (ORIF) RIGHT BIMALLEOLAR ANKLE FRACTURE WITH SYNDESMOSIS FIXATION;  Surgeon: Leandrew Koyanagi, MD;  Location: [place];  Service: Orthopedics;  Laterality: Right;   SUBMUCOSAL LIFTING INJECTION  12/09/2019   Procedure: SUBMUCOSAL LIFTING INJECTION;  Surgeon: Irving Copas., MD;  [facility];  Service: Gastroenterology;;   TEE WITHOUT CARDIOVERSION N/A 08/01/2016   Procedure: TRANSESOPHAGEAL ECHOCARDIOGRAM (TEE);  Surgeon: Lelon Perla, MD;  [facility];  Service:  Cardiovascular;  Laterality: N/A;   THYROIDECTOMY  11/24/2013   DR Dalbert Batman   THYROIDECTOMY N/A 11/24/2013   Procedure: TOTAL THYROIDECTOMY;  Surgeon: Adin Hector, MD;  Location: [place];  Service: General;  Laterality: N/A;   TRANSTHORACIC ECHOCARDIOGRAM  01/15/2013   EF 55% TO 65%. PROBABLE MILD HYPOKINESIS OF THE INFERIOR MYOCARDIUM. GRADE 1 DIASTOLIC DYSFUNCTION. TRIAL AR.LA IS MILDLY DILATED.    Current Outpatient Medications  Medication Sig Dispense Refill   acetaminophen (TYLENOL) 650 MG CR tablet Take 1,300 mg by mouth every 8 (eight) hours as needed for pain.      allopurinol (ZYLOPRIM) 100 MG tablet Take 1 tablet (100 mg total) by mouth in the morning. 90 tablet 0   amLODipine (NORVASC) 5 MG tablet TAKE 1 & 1/2 (ONE & ONE-HALF) TABLETS BY MOUTH IN THE EVENING 135 tablet 1   apixaban (ELIQUIS) 5 MG TABS tablet Take 1 tablet by mouth twice daily 180 tablet 1   carvedilol (COREG) 12.5 MG tablet Take 1 tablet (12.5 mg total) by mouth 2 (two) times daily. 180 tablet 3   FARXIGA 10 MG TABS tablet Take 10 mg by mouth in the morning.     furosemide (LASIX) 20 MG tablet Take 20 mg by mouth daily.     isosorbide dinitrate (ISORDIL) 5 MG tablet Take 1 tablet (5 mg total) by mouth 2 (two) times daily. 180 tablet 3   losartan (COZAAR) 25 MG tablet Take 1 tablet (25 mg total) by mouth at bedtime. 90 tablet 3  Magnesium 400 MG TABS Take 400 mg by mouth every evening.     Multiple Vitamins-Minerals (MULTIVITAMIN WITH MINERALS) tablet Take 1 tablet by mouth 3 (three) times a week.     Probiotic Product (PROBIOTIC PO) Take 1 capsule by mouth at bedtime.     rosuvastatin (CRESTOR) 40 MG tablet Take 1 tablet by mouth once daily 90 tablet 3   SYNTHROID 88 MCG tablet Take 1 tablet (88 mcg total) by mouth daily before breakfast. 90 tablet 3   tolterodine (DETROL) 1 MG tablet Take 1 tablet (1 mg total) by mouth 2 (two) times daily. 60 tablet 3   Vitamin D3 (VITAMIN D) 25 MCG tablet Take 1,000 Units by  mouth daily in the afternoon.     zinc gluconate 50 MG tablet Take 50 mg by mouth daily in the afternoon.     No current facility-administered medications for this encounter.    Allergies  Allergen Reactions   Labetalol     headaches   Codeine Anxiety    Social History   Socioeconomic History   Marital status: Widowed    Spouse name: Not on file   Number of children: 3   Years of education: Not on file   Highest education level: Not on file  Occupational History   Occupation: retired  Tobacco Use   Smoking status: Former    Packs/day: 1.00    Years: 15.00    Total pack years: 15.00    Types: Cigarettes    Quit date: 12/10/1990    Years since quitting: 31.4    Passive exposure: Never   Smokeless tobacco: Never   Tobacco comments:    Former smoker 05/21/22  Vaping Use   Vaping Use: Never used  Substance and Sexual Activity   Alcohol use: Yes    Comment: very rare   Drug use: No   Sexual activity: Not on file  Other Topics Concern   Not on file  Social History Narrative   Right handed    Living  alone.   Social Determinants of Health   Financial Resource Strain: Not on file  Food Insecurity: Not on file  Transportation Needs: Not on file  Physical Activity: Not on file  Stress: Not on file  Social Connections: Not on file  Intimate Partner Violence: Not on file    Family History  Problem Relation Age of Onset   Heart disease Mother    Sudden death Mother    Diabetes Father    Stroke Father    Bone cancer Sister    Sudden death Brother    Melanoma Brother    Heart disease Brother    Colon cancer Neg Hx    Esophageal cancer Neg Hx    Inflammatory bowel disease Neg Hx    Liver disease Neg Hx    Pancreatic cancer Neg Hx    Rectal cancer Neg Hx    Stomach cancer Neg Hx     ROS- All systems are reviewed and negative except as per the HPI above  Physical Exam: Vitals:   05/21/22 1417  BP: 122/70  Pulse: 78  Weight: 92.5 kg  Height: '5\' 5"'$   (1.651 m)   Wt Readings from Last 3 Encounters:  05/21/22 92.5 kg  05/15/22 91.6 kg  05/09/22 93.6 kg    Labs: Lab Results  Component Value Date   NA 140 03/24/2022   K 3.2 (L) 03/24/2022   CL 100 03/24/2022   CO2 30 03/24/2022   GLUCOSE  98 03/24/2022   BUN 17 03/24/2022   CREATININE 1.38 (H) 03/24/2022   CALCIUM 10.1 03/24/2022   MG 2.6 (H) 03/24/2022   Lab Results  Component Value Date   INR 1.3 (H) 08/18/2019   Lab Results  Component Value Date   CHOL 141 05/09/2022   HDL 42 05/09/2022   LDLCALC 78 05/09/2022   TRIG 116 05/09/2022     GEN- The patient is a well appearing obese female, alert and oriented x 3 today.   HEENT-head normocephalic, atraumatic, sclera clear, conjunctiva pink, hearing intact, trachea midline. Lungs- Clear to ausculation bilaterally, normal work of breathing Heart- irregular rate and rhythm, no murmurs, rubs or gallops  GI- soft, NT, ND, + BS Extremities- no clubbing, cyanosis, or edema MS- no significant deformity or atrophy Skin- no rash or lesion Psych- euthymic mood, full affect Neuro- strength and sensation are intact   EKG- Afib Vent. rate 78 BPM PR interval * ms QRS duration 86 ms QT/QTcB 332/378 ms   Epic records reviewed    Assessment and Plan:  1. Persistent Afib S/p afib ablation 07/28/21 Patient back in rate controlled afib. Suspect 2/2 recent COVID infection. We discussed rhythm control options today. Will plan for DCCV. Check bmet/cbc today. Continue carvedilol 25 mg BID until night prior to DCCV then decrease back to 12.5 mg BID. Continue Eliquis 5 mg BID, patient denies any missed doses in the past 3 weeks.   2. CHA2DS2VASc of at least 5 Continue Eliquis as above.  3. HTN Stable, no changes today.  4. CAD No anginal symptoms.  5. OSA The importance of adequate treatment of sleep apnea was discussed today in order to improve our ability to maintain sinus rhythm long term. Encouraged compliance with  CPAP therapy. Followed by Dr Claiborne Billings.   Follow up in the AF clinic post DCCV.    [facility] [address] [place], [place] [address] [phone]

## 2022-05-29 ENCOUNTER — Encounter (HOSPITAL_COMMUNITY): Payer: Self-pay | Admitting: Internal Medicine

## 2022-05-31 ENCOUNTER — Encounter (HOSPITAL_COMMUNITY): Payer: Self-pay | Admitting: Internal Medicine

## 2022-05-31 ENCOUNTER — Other Ambulatory Visit: Payer: Self-pay

## 2022-05-31 ENCOUNTER — Ambulatory Visit (HOSPITAL_COMMUNITY)
Admission: RE | Admit: 2022-05-31 | Discharge: 2022-05-31 | Disposition: A | Discharge status: Home / Self Care | Payer: Medicare Other | Attending: Internal Medicine | Admitting: Internal Medicine

## 2022-05-31 ENCOUNTER — Ambulatory Visit (HOSPITAL_COMMUNITY): Payer: Medicare Other | Admitting: Anesthesiology

## 2022-05-31 ENCOUNTER — Encounter (HOSPITAL_COMMUNITY)
Admission: RE | Disposition: A | Discharge status: Home / Self Care | Payer: Self-pay | Source: Home / Self Care | Attending: Internal Medicine

## 2022-05-31 ENCOUNTER — Ambulatory Visit (HOSPITAL_BASED_OUTPATIENT_CLINIC_OR_DEPARTMENT_OTHER): Payer: Medicare Other | Admitting: Anesthesiology

## 2022-05-31 DIAGNOSIS — G4730 Sleep apnea, unspecified: Secondary | ICD-10-CM | POA: Diagnosis not present

## 2022-05-31 DIAGNOSIS — E039 Hypothyroidism, unspecified: Secondary | ICD-10-CM | POA: Insufficient documentation

## 2022-05-31 DIAGNOSIS — I10 Essential (primary) hypertension: Secondary | ICD-10-CM | POA: Insufficient documentation

## 2022-05-31 DIAGNOSIS — I4891 Unspecified atrial fibrillation: Secondary | ICD-10-CM | POA: Insufficient documentation

## 2022-05-31 DIAGNOSIS — K219 Gastro-esophageal reflux disease without esophagitis: Secondary | ICD-10-CM | POA: Diagnosis not present

## 2022-05-31 DIAGNOSIS — I2510 Atherosclerotic heart disease of native coronary artery without angina pectoris: Secondary | ICD-10-CM | POA: Diagnosis not present

## 2022-05-31 DIAGNOSIS — Z6834 Body mass index (BMI) 34.0-34.9, adult: Secondary | ICD-10-CM | POA: Diagnosis not present

## 2022-05-31 DIAGNOSIS — Z87891 Personal history of nicotine dependence: Secondary | ICD-10-CM | POA: Diagnosis not present

## 2022-05-31 DIAGNOSIS — I4819 Other persistent atrial fibrillation: Secondary | ICD-10-CM | POA: Diagnosis not present

## 2022-05-31 HISTORY — PX: CARDIOVERSION: SHX1299

## 2022-05-31 SURGERY — CARDIOVERSION
Anesthesia: General

## 2022-05-31 MED ORDER — PROPOFOL 10 MG/ML IV BOLUS
INTRAVENOUS | Status: DC | PRN
  Administered 2022-05-31: 100 mg via INTRAVENOUS

## 2022-05-31 MED ORDER — SODIUM CHLORIDE 0.9 % IV SOLN: INTRAVENOUS | Status: DC

## 2022-05-31 MED ORDER — LIDOCAINE 2% (20 MG/ML) 5 ML SYRINGE
INTRAMUSCULAR | Status: DC | PRN
  Administered 2022-05-31: 40 mg via INTRAVENOUS

## 2022-05-31 MED ORDER — APIXABAN 5 MG PO TABS
5.0000 mg | ORAL_TABLET | Freq: Once | ORAL | Status: AC
  Administered 2022-05-31: 5 mg via ORAL
  Filled 2022-05-31: qty 1

## 2022-05-31 MED ORDER — CARVEDILOL 12.5 MG PO TABS
ORAL_TABLET | ORAL | Status: AC
  Filled 2022-05-31: qty 2

## 2022-05-31 MED ORDER — CARVEDILOL 25 MG PO TABS
25.0000 mg | ORAL_TABLET | Freq: Once | ORAL | Status: AC
Start: 2022-05-31 — End: 2022-05-31
  Administered 2022-05-31: 25 mg via ORAL
  Filled 2022-05-31: qty 1

## 2022-05-31 NOTE — Anesthesia Postprocedure Evaluation (Signed)
Anesthesia Post Note  Patient: Claudia Cantrell  Procedure(s) Performed: CARDIOVERSION     Patient location during evaluation: Endoscopy Anesthesia Type: General Level of consciousness: awake and alert, patient cooperative and oriented Pain management: pain level controlled Vital Signs Assessment: post-procedure vital signs reviewed and stable Respiratory status: nonlabored ventilation, spontaneous breathing and respiratory function stable Cardiovascular status: blood pressure returned to baseline and stable Postop Assessment: no apparent nausea or vomiting and able to ambulate Anesthetic complications: no   No notable events documented.  Last Vitals:  Vitals:   05/31/22 1016 05/31/22 1020  BP: 133/75 (!) 113/93  Pulse: 83 71  Resp: (!) 25 (!) 23  Temp: (!) 36.3 C   SpO2: 99% 98%    Last Pain:  Vitals:   05/31/22 1020  TempSrc:   PainSc: 0-No pain                 Keanna Tugwell,E. Antwaun Buth

## 2022-05-31 NOTE — Anesthesia Preprocedure Evaluation (Addendum)
Anesthesia Evaluation  Patient identified by MRN, date of birth, ID band Patient awake    Reviewed: Allergy & Precautions, NPO status , Patient's Chart, lab work & pertinent test results  History of Anesthesia Complications Negative for: history of anesthetic complications  Airway Mallampati: I  TM Distance: >3 FB Neck ROM: Full    Dental  (+) Dental Advisory Given   Pulmonary sleep apnea and Continuous Positive Airway Pressure Ventilation , former smoker,    breath sounds clear to auscultation       Cardiovascular hypertension, Pt. on medications (-) angina+ CAD  + dysrhythmias Atrial Fibrillation  Rhythm:Irregular Rate:Normal  '22 ECHO: EF 55 to 60%. The LV has normal function, no regional wall motion abnormalities. There is mild LVH, mild MR, mild AI   Neuro/Psych negative neurological ROS     GI/Hepatic Neg liver ROS, GERD  Controlled,  Endo/Other  Hypothyroidism Morbid obesity  Renal/GU Renal InsufficiencyRenal disease     Musculoskeletal   Abdominal (+) + obese,   Peds  Hematology eliquis   Anesthesia Other Findings   Reproductive/Obstetrics                            Anesthesia Physical Anesthesia Plan  ASA: 3  Anesthesia Plan: General   Post-op Pain Management: Minimal or no pain anticipated   Induction:   PONV Risk Score and Plan: 3 and Treatment may vary due to age or medical condition  Airway Management Planned: Natural Airway and Mask  Additional Equipment: None  Intra-op Plan:   Post-operative Plan:   Informed Consent: I have reviewed the patients History and Physical, chart, labs and discussed the procedure including the risks, benefits and alternatives for the proposed anesthesia with the patient or authorized representative who has indicated his/her understanding and acceptance.     Dental advisory given  Plan Discussed with: CRNA and Surgeon  Anesthesia  Plan Comments:        Anesthesia Quick Evaluation

## 2022-05-31 NOTE — Transfer of Care (Signed)
Immediate Anesthesia Transfer of Care Note  Patient: Claudia Cantrell  Procedure(s) Performed: CARDIOVERSION  Patient Location: Endoscopy Unit  Anesthesia Type:General  Level of Consciousness: awake, alert  and oriented  Airway & Oxygen Therapy: Patient Spontanous Breathing  Post-op Assessment: Report given to RN and Post -op Vital signs reviewed and stable  Post vital signs: Reviewed and stable  Last Vitals:  Vitals Value Taken Time  BP    Temp    Pulse    Resp    SpO2      Last Pain:  Vitals:   05/31/22 0915  TempSrc: Temporal  PainSc: 0-No pain         Complications: No notable events documented.

## 2022-05-31 NOTE — Anesthesia Procedure Notes (Signed)
Procedure Name: General with mask airway Date/Time: 05/31/2022 10:00 AM  Performed by: Mariea Clonts, CRNAPre-anesthesia Checklist: Timeout performed, Emergency Drugs available, Patient identified, Suction available and Patient being monitored Patient Re-evaluated:Patient Re-evaluated prior to induction Oxygen Delivery Method: Ambu bag Preoxygenation: Pre-oxygenation with 100% oxygen Induction Type: IV induction

## 2022-05-31 NOTE — Discharge Instructions (Signed)

## 2022-05-31 NOTE — Interval H&P Note (Signed)
History and Physical Interval Note:  05/31/2022 9:46 AM  Claudia Cantrell  has presented today for surgery, with the diagnosis of Atrial fibrillation.  The various methods of treatment have been discussed with the patient and family. After consideration of risks, benefits and other options for treatment, the patient has consented to  Procedure(s): CARDIOVERSION (N/A) as a surgical intervention.  The patient's history has been reviewed, patient examined, no change in status, stable for surgery.  I have reviewed the patient's chart and labs.  Questions were answered to the patient's satisfaction.     [name]

## 2022-05-31 NOTE — CV Procedure (Signed)
   CARDIOVERSION NOTE  Procedure: Electrical Cardioversion Indications:  Atrial Fibrillation  Procedure Details:  Consent: Risks of procedure as well as the alternatives and risks of each were explained to the (patient/caregiver).  Consent for procedure obtained.  Time Out: Verified patient identification, verified procedure, site/side was marked, verified correct patient position, special equipment/implants available, medications/allergies/relevent history reviewed, required imaging and test results available.  Performed  Patient placed on cardiac monitor, pulse oximetry, supplemental oxygen as necessary.  Sedation given:  per anesthesia Pacer pads placed anterior and posterior chest.  Cardioverted  4  time(s).  Cardioverted at 150J and 200J x 3 with pressure and pad repositioning, biphasic.  Impression: Findings: Post procedure EKG shows: Atrial Fibrillation Complications: None Patient did tolerate procedure well.  Plan: Unsuccessful DCCV after 4 stacked shocks, pressure, pad repositioning -no sinus beats or pauses were noted. Recommend follow-up with afib clinic to consider anti-arrhythmic medication.  Time Spent Directly with the Patient:  30 minutes   [name], MD, [facility], [place] Director of the [facility] &  Cardiovascular Risk [facility] Clinic Diplomate of the American Board of Clinical Lipidology Attending Cardiologist  Direct Dial: [phone]  Fax: [phone]  Website:  [email or web address] 05/31/2022, 10:11 AM

## 2022-06-01 ENCOUNTER — Encounter (HOSPITAL_COMMUNITY): Payer: Self-pay | Admitting: Internal Medicine

## 2022-06-05 ENCOUNTER — Ambulatory Visit (HOSPITAL_COMMUNITY)
Admission: RE | Admit: 2022-06-05 | Discharge: 2022-06-05 | Disposition: A | Discharge status: Home / Self Care | Payer: Medicare Other | Source: Ambulatory Visit | Attending: Physician Assistant | Admitting: Physician Assistant

## 2022-06-05 ENCOUNTER — Encounter (HOSPITAL_COMMUNITY): Payer: Self-pay | Admitting: Physician Assistant

## 2022-06-05 VITALS — BP 116/88 | HR 80 | Ht 65.0 in | Wt 203.2 lb

## 2022-06-05 DIAGNOSIS — D6869 Other thrombophilia: Secondary | ICD-10-CM

## 2022-06-05 DIAGNOSIS — I2510 Atherosclerotic heart disease of native coronary artery without angina pectoris: Secondary | ICD-10-CM | POA: Insufficient documentation

## 2022-06-05 DIAGNOSIS — G4733 Obstructive sleep apnea (adult) (pediatric): Secondary | ICD-10-CM | POA: Diagnosis not present

## 2022-06-05 DIAGNOSIS — I4819 Other persistent atrial fibrillation: Secondary | ICD-10-CM | POA: Insufficient documentation

## 2022-06-05 DIAGNOSIS — I129 Hypertensive chronic kidney disease with stage 1 through stage 4 chronic kidney disease, or unspecified chronic kidney disease: Secondary | ICD-10-CM | POA: Insufficient documentation

## 2022-06-05 DIAGNOSIS — N189 Chronic kidney disease, unspecified: Secondary | ICD-10-CM | POA: Diagnosis not present

## 2022-06-05 DIAGNOSIS — Z7901 Long term (current) use of anticoagulants: Secondary | ICD-10-CM | POA: Diagnosis not present

## 2022-06-05 MED ORDER — AMIODARONE HCL 200 MG PO TABS: ORAL_TABLET | ORAL | 6 refills | Status: DC

## 2022-06-11 ENCOUNTER — Ambulatory Visit (HOSPITAL_COMMUNITY): Payer: Medicare Other | Admitting: Physician Assistant

## 2022-06-13 ENCOUNTER — Other Ambulatory Visit: Payer: Self-pay | Admitting: Cardiovascular Disease

## 2022-06-26 ENCOUNTER — Encounter (HOSPITAL_COMMUNITY): Payer: Self-pay

## 2022-06-26 ENCOUNTER — Inpatient Hospital Stay (HOSPITAL_COMMUNITY): Admission: RE | Admit: 2022-06-26 | Payer: Medicare Other | Source: Ambulatory Visit | Admitting: Physician Assistant

## 2022-06-27 ENCOUNTER — Encounter: Payer: Self-pay | Admitting: Internal Medicine

## 2022-06-27 ENCOUNTER — Telehealth (HOSPITAL_COMMUNITY): Payer: Self-pay | Admitting: Physician Assistant

## 2022-06-27 ENCOUNTER — Ambulatory Visit (INDEPENDENT_AMBULATORY_CARE_PROVIDER_SITE_OTHER): Payer: Medicare Other | Admitting: Internal Medicine

## 2022-06-27 VITALS — BP 128/70 | HR 60 | Ht 65.0 in | Wt 206.0 lb

## 2022-06-27 DIAGNOSIS — R7303 Prediabetes: Secondary | ICD-10-CM | POA: Diagnosis not present

## 2022-06-27 DIAGNOSIS — E890 Postprocedural hypothyroidism: Secondary | ICD-10-CM | POA: Diagnosis not present

## 2022-06-27 NOTE — Progress Notes (Signed)
Name: Claudia Cantrell  MRN/ DOB: [id number], Sep 22, 1946    Age/ Sex: 76 y.o., female    PCP: Marcellina Millin   Reason for Endocrinology Evaluation: Hypothyroidism     Date of Initial Endocrinology Evaluation: 06/27/2022     HPI: [name] is a 76 y.o. female with a past medical history of postoperative hypothyroidism, HTN, dyslipidemia and A.Fib . The patient presented for initial endocrinology clinic visit on 06/27/2022 for consultative assistance with her Hypothyroidism.   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 is here for a follow up on postoperative hypothyroidism and Pre-diabetes.   She was started on Amiodarone in 05/2022 Weight stable  Continue with A. Fib and palpitations  Recent attempt at ablation without success   She has been snacking and increasing CHO intake     HISTORY:  Past Medical History:  Past Medical History:  Diagnosis Date   Arthritis    rheumatoid   CAD (coronary artery disease)    a. 1992 s/p MI and PTCA of unknown vessel;  b. 09/2010 Cath: LM nl, LAD 20p, LCX 79m RCA 191mRPL 20.   [place] [facility] [place]   Chronic kidney disease    Family history of anesthesia complication    daughter has difficulty waking    GERD (gastroesophageal reflux disease)    occ   Gout 10/08/2016   Hyperlipidemia    Hypertensive heart disease    Hypothyroidism    Nonischemic cardiomyopathy ([facility]   a. 07/2016 Echo: EF 40-45%, mild LVH, inferior akinesis, moderately dilated left atrium, trivial AI and MR.   PAF (paroxysmal atrial fibrillation) ([facility]   a. 07/2016 Admitted w/ AF RVR-->CHA2DS2VASc = 5-->Eliquis;  b. 07/2016 successful TEE/DCCV.   Sleep apnea    a. Using CPAP.   Past  Surgical History:  Past Surgical History:  Procedure Laterality Date   ABDOMINAL HYSTERECTOMY  1988   ATRIAL FIBRILLATION ABLATION N/A 07/28/2021   Procedure: ATRIAL FIBRILLATION ABLATION;  Surgeon: CaConstance HawMD;  [facility];  Service: Cardiovascular;  Laterality: N/A;   [place] BIOPSY  09/02/2019   Procedure: BIOPSY;  Surgeon: MaRush LandmarkaTelford Nab MD;  [facility];  Service: Gastroenterology;;   BIOPSY  12/09/2019   Procedure: BIOPSY;  Surgeon: MaIrving Copas MD;  [facility];  Service: Gastroenterology;;   BIOPSY  03/08/2021   Procedure: BIOPSY;  Surgeon: MaIrving Copas MD;  [facility];  Service: Gastroenterology;;   [place] 08/01/2016   Procedure: CARDIOVERSION;  Surgeon: BrLelon PerlaMD;  [facility];  Service: Cardiovascular;  Laterality: N/A;   CARDIOVERSION N/A 01/11/2020   Procedure: CARDIOVERSION;  Surgeon: NiJosue HectorMD;  [facility];  Service: Cardiovascular;  Laterality: N/A;   CARDIOVERSION N/A 07/08/2020   Procedure: CARDIOVERSION;  Surgeon: ScDonato HeinzMD;  [facility]: Cardi[facility];  Laterality: N/A;   CARDIOVERSION N/A 05/31/2022   Procedure: CARDIOVERSION;  Surgeon: HiPixie CasinoMD;  [place] [facility] [place] [facility]: Cardiovascular;  Laterality: N/A;   ENDOSCOPIC MUCOSAL  RESECTION N/A 12/09/2019   Procedure: ENDOSCOPIC MUCOSAL RESECTION;  Surgeon: Rush Landmark Telford Nab., MD;  [facility];  Service: Gastroenterology;  Laterality: N/A;   ESOPHAGOGASTRODUODENOSCOPY N/A 03/08/2021   Procedure: ESOPHAGOGASTRODUODENOSCOPY (EGD);  Surgeon: Irving Copas., MD;  [facility];  Service: Gastroenterology;  Laterality: N/A;   ESOPHAGOGASTRODUODENOSCOPY (EGD) WITH PROPOFOL N/A 09/02/2019   Procedure:  ESOPHAGOGASTRODUODENOSCOPY (EGD) WITH PROPOFOL;  Surgeon: Rush Landmark Telford Nab., MD;  [facility];  Service: Gastroenterology;  Laterality: N/A;   ESOPHAGOGASTRODUODENOSCOPY (EGD) WITH PROPOFOL N/A 12/09/2019   Procedure: ESOPHAGOGASTRODUODENOSCOPY (EGD) WITH PROPOFOL;  Surgeon: Rush Landmark Telford Nab., MD;  [facility];  Service: Gastroenterology;  Laterality: N/A;   EUS N/A 09/02/2019   Procedure: UPPER ENDOSCOPIC ULTRASOUND (EUS) RADIAL;  Surgeon: Irving Copas., MD;  [facility];  Service: Gastroenterology;  Laterality: N/A;  EUS radial/linear   EUS N/A 12/09/2019   Procedure: UPPER ENDOSCOPIC ULTRASOUND (EUS) RADIAL;  Surgeon: Irving Copas., MD;  [facility];  Service: Gastroenterology;  Laterality: N/A;   EUS  12/09/2019   Procedure: UPPER ENDOSCOPIC ULTRASOUND (EUS) LINEAR;  Surgeon: Irving Copas., MD;  [facility];  Service: Gastroenterology;;   EUS N/A 03/08/2021   Procedure: UPPER ENDOSCOPIC ULTRASOUND (EUS) RADIAL;  Surgeon: Irving Copas., MD;  [facility];  Service: Gastroenterology;  Laterality: N/A;   FINE NEEDLE ASPIRATION  09/02/2019   Procedure: FINE NEEDLE ASPIRATION (FNA) LINEAR;  Surgeon: Irving Copas., MD;  [facility];  Service: Gastroenterology;;   FINE NEEDLE ASPIRATION  12/09/2019   Procedure: FINE NEEDLE ASPIRATION (FNA) LINEAR;  Surgeon: Irving Copas., MD;  [facility];  Service: Gastroenterology;;   HEMOSTASIS CLIP PLACEMENT  12/09/2019   Procedure: HEMOSTASIS CLIP PLACEMENT;  Surgeon: Irving Copas., MD;  [facility];  Service: Gastroenterology;;   ORIF ANKLE FRACTURE Right 04/27/2015   Procedure: OPEN REDUCTION INTERNAL FIXATION (ORIF) RIGHT BIMALLEOLAR ANKLE FRACTURE WITH SYNDESMOSIS FIXATION;  Surgeon: Leandrew Koyanagi, MD;  Location: [place];  Service: Orthopedics;  Laterality: Right;   SUBMUCOSAL LIFTING  INJECTION  12/09/2019   Procedure: SUBMUCOSAL LIFTING INJECTION;  Surgeon: Irving Copas., MD;  [facility];  Service: Gastroenterology;;   TEE WITHOUT CARDIOVERSION N/A 08/01/2016   Procedure: TRANSESOPHAGEAL ECHOCARDIOGRAM (TEE);  Surgeon: Lelon Perla, MD;  [facility];  Service: Cardiovascular;  Laterality: N/A;   THYROIDECTOMY  11/24/2013   DR Dalbert Batman   THYROIDECTOMY N/A 11/24/2013   Procedure: TOTAL THYROIDECTOMY;  Surgeon: Adin Hector, MD;  Location: [place];  Service: General;  Laterality: N/A;   TRANSTHORACIC ECHOCARDIOGRAM  01/15/2013   EF 55% TO 65%. PROBABLE MILD HYPOKINESIS OF THE INFERIOR MYOCARDIUM. GRADE 1 DIASTOLIC DYSFUNCTION. TRIAL AR.LA IS MILDLY DILATED.    Social History:  reports that she quit smoking about 31 years ago. Her smoking use included cigarettes. She has a 15.00 pack-year smoking history. She has never been exposed to tobacco smoke. She has never used smokeless tobacco. She reports current alcohol use. She reports that she does not use drugs. Family History: family history includes Bone cancer in her sister; Diabetes in her father; Heart disease in her brother and mother; Melanoma in her brother; Stroke in her father; Sudden death in her brother and mother.   HOME MEDICATIONS: Allergies as of 06/27/2022       Reactions   Labetalol    headaches   Codeine Anxiety        Medication List  Accurate as of June 27, 2022  2:59 PM. If you have any questions, ask your nurse or doctor.          acetaminophen 500 MG tablet Commonly known as: TYLENOL Take 1,000 mg by mouth every 6 (six) hours as needed for moderate pain.   allopurinol 100 MG tablet Commonly known as: ZYLOPRIM Take 1 tablet (100 mg total) by mouth in the morning.   amiodarone 200 MG tablet Commonly known as: Pacerone Take 1 tablet (200 mg total) by mouth 2 (two) times daily for 30 days, THEN 1 tablet (200 mg total) daily. Start taking on: June 05, 2022   amLODipine 5 MG tablet Commonly known as: NORVASC TAKE 1 & 1/2 (ONE & ONE-HALF) TABLETS BY MOUTH IN THE EVENING   carvedilol 12.5 MG tablet Commonly known as: COREG Take 1 tablet (12.5 mg total) by mouth 2 (two) times daily. What changed: how much to take   Eliquis 5 MG Tabs tablet Generic drug: apixaban Take 1 tablet by mouth twice daily   Farxiga 10 MG Tabs tablet Generic drug: dapagliflozin propanediol Take 10 mg by mouth in the morning.   furosemide 20 MG tablet Commonly known as: LASIX Take 20 mg by mouth daily.   isosorbide dinitrate 5 MG tablet Commonly known as: ISORDIL Take 1 tablet by mouth twice daily   losartan 25 MG tablet Commonly known as: COZAAR Take 1 tablet (25 mg total) by mouth at bedtime.   Magnesium 400 MG Tabs Take 400 mg by mouth 2 (two) times a week.   multivitamin with minerals tablet Take 1 tablet by mouth 2 (two) times a week.   PROBIOTIC PO Take 1 capsule by mouth 2 (two) times a week.   rosuvastatin 40 MG tablet Commonly known as: CRESTOR Take 1 tablet by mouth once daily   Synthroid 88 MCG tablet Generic drug: levothyroxine Take 1 tablet (88 mcg total) by mouth daily before breakfast.   tolterodine 1 MG tablet Commonly known as: DETROL Take 1 tablet (1 mg total) by mouth 2 (two) times daily.   Vitamin D3 25 MCG tablet Commonly known as: Vitamin D Take 1,000 Units by mouth 2 (two) times a week.   zinc gluconate 50 MG tablet Take 50 mg by mouth 2 (two) times a week.          REVIEW OF SYSTEMS: A comprehensive ROS was conducted with the patient and is negative except as per HPI    OBJECTIVE:  VS: BP 128/70 (BP Location: Left Arm, Patient Position: Sitting, Cuff Size: Large)   Pulse 60   Ht '5\' 5"'$  (1.651 m)   Wt 206 lb (93.4 kg)   SpO2 95%   BMI 34.28 kg/m   Wt Readings from Last 3 Encounters:  06/27/22 206 lb (93.4 kg)  06/05/22 203 lb 3.2 oz (92.2 kg)  05/31/22 205 lb (93 kg)      EXAM: General: Pt appears well and is in NAD  Neck: General: Supple without adenopathy. Thyroid: Thyroid size normal.  No goiter or nodules appreciated.  Lungs: Clear with good BS bilat with no rales, rhonchi, or wheezes  Heart: Auscultation: RRR.  Abdomen: Normoactive bowel sounds, soft, nontender, without masses or organomegaly palpable  Extremities:  BL LE: No pretibial edema normal ROM and strength.  Mental Status: Judgment, insight: Intact Orientation: Oriented to time, place, and person Mood and affect: No depression, anxiety, or agitation     DATA REVIEWED:  Latest Reference Range & Units 06/27/22  15:19  TSH 0.35 - 5.50 uIU/mL 8.64 (H)     Latest Reference Range & Units 05/21/22 15:12  Sodium 135 - 145 mmol/L 142  Potassium 3.5 - 5.1 mmol/L 3.6  Chloride 98 - 111 mmol/L 106  CO2 22 - 32 mmol/L 27  Glucose 70 - 99 mg/dL 98  BUN 8 - 23 mg/dL 22  Creatinine 0.44 - 1.00 mg/dL 1.04 (H)  Calcium 8.9 - 10.3 mg/dL 10.1  Anion gap 5 - 15  9  GFR, Estimated >60 mL/min 57 (L)      Latest Reference Range & Units 01/15/22 15:01  Hemoglobin A1C 4.0 - 5.6 % 5.9 !    ASSESSMENT/PLAN/RECOMMENDATIONS:   Postoperative Hypothyroidism    - Pt is  clinically euthyroid  - No local neck symptoms - TSH elevated, will increase levothyroxine dose - Pt educated extensively on the correct way to take levothyroxine (first thing in the morning with water, 30 minutes before eating or taking other medications). - Pt encouraged to double dose the following day if she were to miss a dose given long half-life of levothyroxine.  Medications : Stop Synthroid 88 mcg daily  Start Synthroid 100 mcg daily     2. Pre-diabetes :   - A1c stable 6.1 %  -I have encouraged her to continue with lifestyle changes   F/U 3 months   Signed electronically by: Mack Guise, MD  [facility]  [facility] [address] [place], [place]  [address] Phone: [phone] FAX: [phone]   CC: Irene Pap, PA-C No address on file Phone: None Fax: None   Return to Endocrinology clinic as below: Future Appointments  Date Time Provider [place]  07/10/2022  9:30 AM Oliver Barre, [place] [facility] None  08/03/2022  8:30 AM Constance Haw, [place] [facility]  08/16/2022  2:40 PM Ofilia Neas, PA-C [facility] None  01/27/2024 11:00 AM Truitt Merle, MD [facility] None

## 2022-06-27 NOTE — Patient Instructions (Signed)

## 2022-06-28 ENCOUNTER — Telehealth: Payer: Self-pay | Admitting: Internal Medicine

## 2022-06-28 DIAGNOSIS — E890 Postprocedural hypothyroidism: Secondary | ICD-10-CM | POA: Insufficient documentation

## 2022-06-28 LAB — TSH: TSH: 8.64 u[IU]/mL — ABNORMAL HIGH (ref 0.35–5.50)

## 2022-06-28 MED ORDER — LEVOTHYROXINE SODIUM 100 MCG PO TABS: 100.0000 ug | ORAL_TABLET | Freq: Every day | ORAL | 3 refills | Status: DC

## 2022-06-28 MED ORDER — SYNTHROID 100 MCG PO TABS: 100.0000 ug | ORAL_TABLET | Freq: Every day | ORAL | 3 refills | Status: DC

## 2022-06-28 NOTE — Telephone Encounter (Signed)
Please let the patient know that her thyroid test is off and I do not know if she has been missing any of her Synthroid on night But I would suggest stopping Synthroid 88 mcg   Start Synthroid 100 mcg daily  Thanks

## 2022-06-28 NOTE — Telephone Encounter (Signed)
Left a vm for patient to callback 

## 2022-06-29 NOTE — Telephone Encounter (Signed)
Patient notified and will pick up new medication.

## 2022-07-10 ENCOUNTER — Ambulatory Visit (HOSPITAL_COMMUNITY)
Admission: RE | Admit: 2022-07-10 | Discharge: 2022-07-10 | Disposition: A | Discharge status: Home / Self Care | Payer: Medicare Other | Source: Ambulatory Visit | Attending: Physician Assistant | Admitting: Physician Assistant

## 2022-07-10 VITALS — BP 116/76 | HR 64 | Ht 65.0 in | Wt 209.8 lb

## 2022-07-10 DIAGNOSIS — I2510 Atherosclerotic heart disease of native coronary artery without angina pectoris: Secondary | ICD-10-CM | POA: Diagnosis not present

## 2022-07-10 DIAGNOSIS — G4733 Obstructive sleep apnea (adult) (pediatric): Secondary | ICD-10-CM | POA: Diagnosis not present

## 2022-07-10 DIAGNOSIS — Z8616 Personal history of COVID-19: Secondary | ICD-10-CM | POA: Diagnosis not present

## 2022-07-10 DIAGNOSIS — Z79899 Other long term (current) drug therapy: Secondary | ICD-10-CM | POA: Insufficient documentation

## 2022-07-10 DIAGNOSIS — Z7901 Long term (current) use of anticoagulants: Secondary | ICD-10-CM | POA: Diagnosis not present

## 2022-07-10 DIAGNOSIS — D6869 Other thrombophilia: Secondary | ICD-10-CM | POA: Diagnosis not present

## 2022-07-10 DIAGNOSIS — I10 Essential (primary) hypertension: Secondary | ICD-10-CM | POA: Diagnosis not present

## 2022-07-10 DIAGNOSIS — I4819 Other persistent atrial fibrillation: Secondary | ICD-10-CM | POA: Insufficient documentation

## 2022-07-10 LAB — CBC
HCT: 48.8 % — ABNORMAL HIGH (ref 36.0–46.0)
Hemoglobin: 15.6 g/dL — ABNORMAL HIGH (ref 12.0–15.0)
MCH: 31.1 pg (ref 26.0–34.0)
MCHC: 32 g/dL (ref 30.0–36.0)
MCV: 97.4 fL (ref 80.0–100.0)
Platelets: 170 10*3/uL (ref 150–400)
RBC: 5.01 MIL/uL (ref 3.87–5.11)
RDW: 15 % (ref 11.5–15.5)
WBC: 2.8 10*3/uL — ABNORMAL LOW (ref 4.0–10.5)
nRBC: 0 % (ref 0.0–0.2)

## 2022-07-10 LAB — BASIC METABOLIC PANEL
Anion gap: 6 (ref 5–15)
BUN: 16 mg/dL (ref 8–23)
CO2: 27 mmol/L (ref 22–32)
Calcium: 9.5 mg/dL (ref 8.9–10.3)
Chloride: 109 mmol/L (ref 98–111)
Creatinine, Ser: 1.64 mg/dL — ABNORMAL HIGH (ref 0.44–1.00)
GFR, Estimated: 33 mL/min — ABNORMAL LOW (ref >60)
Glucose, Bld: 86 mg/dL (ref 70–99)
Potassium: 4.1 mmol/L (ref 3.5–5.1)
Sodium: 142 mmol/L (ref 135–145)

## 2022-07-10 MED ORDER — CARVEDILOL 12.5 MG PO TABS: 25.0000 mg | ORAL_TABLET | Freq: Two times a day (BID) | ORAL | Status: DC

## 2022-07-10 NOTE — Progress Notes (Signed)
Primary Care Physician: Marcellina Millin Referring Physician: Dr. Roxine Caddy Claudia Cantrell is a 76 y.o. female with a h/o atrial fibrillation, CAD, OSA, HTN who presents for follow up in the [facility]. Patient is s/p afib ablation with Dr Curt Bears on 07/28/21. She had done well with no know episodes of afib until she presented to the ED on 03/24/22 with chest pain. She tested positive for COVID at that time. ECG showed rate controlled afib. Seen by Dr Claiborne Billings on 05/15/22 and she was still in afib, her carvedilol was increased.   Patient is s/p DCCV 05/31/22 which was unsuccessful after 4 shocks. She has symptoms of fatigue and exercise intolerance when in afib. She was started on amiodarone as a bridge to possible repeat ablation.   On follow up today, patient reports that she has noticed a small improvement in her fatigue since starting amiodarone. She remains in rate controlled afib. No bleeding issues on anticoagulation.   Today, she denies symptoms of palpitations, chest pain, shortness of breath, orthopnea, PND, lower extremity edema, dizziness, presyncope, syncope, or neurologic sequela. The patient is tolerating medications without difficulties and is otherwise without complaint today.   Past Medical History:  Diagnosis Date   Arthritis    rheumatoid   CAD (coronary artery disease)    a. 1992 s/p MI and PTCA of unknown vessel;  b. 09/2010 Cath: LM nl, LAD 20p, LCX 14m RCA 192mRPL 20.   [place]   Chronic kidney disease    Family history of anesthesia complication    daughter has difficulty waking    GERD (gastroesophageal reflux disease)    occ   Gout 10/08/2016   Hyperlipidemia    Hypertensive heart disease    Hypothyroidism    Nonischemic cardiomyopathy ([facility]   a. 07/2016 Echo: EF 40-45%, mild LVH, inferior akinesis, moderately dilated left atrium, trivial AI and MR.   PAF (paroxysmal atrial fibrillation) ([facility]   a. 07/2016  Admitted w/ AF RVR-->CHA2DS2VASc = 5-->Eliquis;  b. 07/2016 successful TEE/DCCV.   Sleep apnea    a. Using CPAP.   Past Surgical History:  Procedure Laterality Date   ABDOMINAL HYSTERECTOMY  1988   ATRIAL FIBRILLATION ABLATION N/A 07/28/2021   Procedure: ATRIAL FIBRILLATION ABLATION;  Surgeon: CaConstance HawMD;  [facility];  Service: Cardiovascular;  Laterality: N/A;   [facility] BI[facility]SY  09/02/2019   Procedure: BIOPSY;  Surgeon: MaRush LandmarkaTelford Nab MD;  [facility];  Service: Gastroenterology;;   BIOPSY  12/09/2019   Procedure: BIOPSY;  Surgeon: MaIrving Copas MD;  [facility];  Service: Gastroenterology;;   BIOPSY  03/08/2021   Procedure: BIOPSY;  Surgeon: MaIrving Copas MD;  [facility];  Service: Gastroenterology;;   [place] PT[place] BY DR CH[place]/A 08/01/2016   Procedure: CARDIOVERSION;  Surgeon: BrLelon PerlaMD;  [facility];  Service: Cardiovascular;  Laterality: N/A;   CARDIOVERSION N/A 01/11/2020   Procedure: CARDIOVERSION;  Surgeon: NiJosue HectorMD;  [facility];  Service: Cardiovascular;  Laterality: N/A;   CARDIOVERSION N/A 07/08/2020   Procedure: CARDIOVERSION;  Surgeon: ScDonato HeinzMD;  [place] Service: Cardiovascular;  Laterality: N/A;   CARDIOVERSION N/A 05/31/2022   Procedure: CARDIOVERSION;  Surgeon: HiPixie CasinoMD;  [facility]: [facility]: Cardiovascular;  Laterality:  N/A;   ENDOSCOPIC MUCOSAL RESECTION N/A 12/09/2019   Procedure: ENDOSCOPIC MUCOSAL RESECTION;  Surgeon: Rush Landmark Telford Nab., MD;  [facility];  Service: Gastroenterology;  Laterality: N/A;   ESOPHAGOGASTRODUODENOSCOPY N/A 03/08/2021   Procedure: ESOPHAGOGASTRODUODENOSCOPY (EGD);  Surgeon: Irving Copas., MD;  [facility];  Service: Gastroenterology;   Laterality: N/A;   ESOPHAGOGASTRODUODENOSCOPY (EGD) WITH PROPOFOL N/A 09/02/2019   Procedure: ESOPHAGOGASTRODUODENOSCOPY (EGD) WITH PROPOFOL;  Surgeon: Rush Landmark Telford Nab., MD;  [facility];  Service: Gastroenterology;  Laterality: N/A;   ESOPHAGOGASTRODUODENOSCOPY (EGD) WITH PROPOFOL N/A 12/09/2019   Procedure: ESOPHAGOGASTRODUODENOSCOPY (EGD) WITH PROPOFOL;  Surgeon: Rush Landmark Telford Nab., MD;  [facility];  Service: Gastroenterology;  Laterality: N/A;   EUS N/A 09/02/2019   Procedure: UPPER ENDOSCOPIC ULTRASOUND (EUS) RADIAL;  Surgeon: Irving Copas., MD;  [facility];  Service: Gastroenterology;  Laterality: N/A;  EUS radial/linear   EUS N/A 12/09/2019   Procedure: UPPER ENDOSCOPIC ULTRASOUND (EUS) RADIAL;  Surgeon: Irving Copas., MD;  [facility];  Service: Gastroenterology;  Laterality: N/A;   EUS  12/09/2019   Procedure: UPPER ENDOSCOPIC ULTRASOUND (EUS) LINEAR;  Surgeon: Irving Copas., MD;  [facility];  Service: Gastroenterology;;   EUS N/A 03/08/2021   Procedure: UPPER ENDOSCOPIC ULTRASOUND (EUS) RADIAL;  Surgeon: Irving Copas., MD;  [facility];  Service: Gastroenterology;  Laterality: N/A;   FINE NEEDLE ASPIRATION  09/02/2019   Procedure: FINE NEEDLE ASPIRATION (FNA) LINEAR;  Surgeon: Irving Copas., MD;  [facility];  Service: Gastroenterology;;   FINE NEEDLE ASPIRATION  12/09/2019   Procedure: FINE NEEDLE ASPIRATION (FNA) LINEAR;  Surgeon: Irving Copas., MD;  [facility];  Service: Gastroenterology;;   HEMOSTASIS CLIP PLACEMENT  12/09/2019   Procedure: HEMOSTASIS CLIP PLACEMENT;  Surgeon: Irving Copas., MD;  [facility];  Service: Gastroenterology;;   ORIF ANKLE FRACTURE Right 04/27/2015   Procedure: OPEN REDUCTION INTERNAL FIXATION (ORIF) RIGHT BIMALLEOLAR ANKLE FRACTURE WITH SYNDESMOSIS FIXATION;  Surgeon: Leandrew Koyanagi, MD;  Location: [facility];  Service: Orthopedics;  Laterality: Right;   SUBMUCOSAL LIFTING INJECTION  12/09/2019   Procedure: SUBMUCOSAL LIFTING INJECTION;  Surgeon: Irving Copas., MD;  [facility];  Service: Gastroenterology;;   TEE WITHOUT CARDIOVERSION N/A 08/01/2016   Procedure: TRANSESOPHAGEAL ECHOCARDIOGRAM (TEE);  Surgeon: Lelon Perla, MD;  Location: [facility] ENDOSCOPY;  Service: Cardiovascular;  Laterality: N/A;   THYROIDECTOMY  11/24/2013   DR Dalbert Batman   THYROIDECTOMY N/A 11/24/2013   Procedure: TOTAL THYROIDECTOMY;  Surgeon: Adin Hector, MD;  Location: [place];  Service: General;  Laterality: N/A;   TRANSTHORACIC ECHOCARDIOGRAM  01/15/2013   EF 55% TO 65%. PROBABLE MILD HYPOKINESIS OF THE INFERIOR MYOCARDIUM. GRADE 1 DIASTOLIC DYSFUNCTION. TRIAL AR.LA IS MILDLY DILATED.    Current Outpatient Medications  Medication Sig Dispense Refill   acetaminophen (TYLENOL) 500 MG tablet Take 1,000 mg by mouth every 6 (six) hours as needed for moderate pain.     allopurinol (ZYLOPRIM) 100 MG tablet Take 1 tablet (100 mg total) by mouth in the morning. 90 tablet 0   amiodarone (PACERONE) 200 MG tablet Take 1 tablet (200 mg total) by mouth 2 (two) times daily for 30 days, THEN 1 tablet (200 mg total) daily. 60 tablet 6   amLODipine (NORVASC) 5 MG tablet TAKE 1 & 1/2 (ONE & ONE-HALF) TABLETS BY MOUTH IN THE EVENING 135 tablet 1   apixaban (ELIQUIS) 5 MG TABS tablet Take 1 tablet by mouth twice daily 180  tablet 1   FARXIGA 10 MG TABS tablet Take 10 mg by mouth in the morning.     furosemide (LASIX) 20 MG tablet Take 20 mg by mouth as needed.     hydrochlorothiazide (MICROZIDE) 12.5 MG capsule Take 12.5 mg by mouth every morning.     isosorbide dinitrate (ISORDIL) 5 MG tablet Take 1 tablet by mouth twice daily 180 tablet 1   Magnesium 400 MG TABS Take 400 mg by mouth 2 (two) times a week.     Multiple Vitamins-Minerals (MULTIVITAMIN WITH MINERALS) tablet Take 1 tablet by mouth  daily.     Probiotic Product (PROBIOTIC PO) Take 1 capsule by mouth 2 (two) times a week.     rosuvastatin (CRESTOR) 40 MG tablet Take 1 tablet by mouth once daily 90 tablet 3   SYNTHROID 100 MCG tablet Take 1 tablet (100 mcg total) by mouth daily before breakfast. 90 tablet 3   Vitamin D3 (VITAMIN D) 25 MCG tablet Take 1,000 Units by mouth 2 (two) times a week.     zinc gluconate 50 MG tablet Take 50 mg by mouth 2 (two) times a week.     carvedilol (COREG) 12.5 MG tablet Take 2 tablets (25 mg total) by mouth 2 (two) times daily.     losartan (COZAAR) 25 MG tablet Take 1 tablet (25 mg total) by mouth at bedtime. 90 tablet 3   tolterodine (DETROL) 1 MG tablet Take 1 tablet (1 mg total) by mouth 2 (two) times daily. (Patient not taking: Reported on 07/10/2022) 60 tablet 3   No current facility-administered medications for this encounter.    Allergies  Allergen Reactions   Labetalol     headaches   Codeine Anxiety    Social History   Socioeconomic History   Marital status: Widowed    Spouse name: Not on file   Number of children: 3   Years of education: Not on file   Highest education level: Not on file  Occupational History   Occupation: retired  Tobacco Use   Smoking status: Former    Packs/day: 1.00    Years: 15.00    Total pack years: 15.00    Types: Cigarettes    Quit date: 12/10/1990    Years since quitting: 31.6    Passive exposure: Never   Smokeless tobacco: Never   Tobacco comments:    Former smoker 05/21/22  Vaping Use   Vaping Use: Never used  Substance and Sexual Activity   Alcohol use: Yes    Comment: very rare   Drug use: No   Sexual activity: Not on file  Other Topics Concern   Not on file  Social History Narrative   Right handed    Living  alone.   Social Determinants of Health   Financial Resource Strain: Not on file  Food Insecurity: Not on file  Transportation Needs: Not on file  Physical Activity: Not on file  Stress: Not on file  Social  Connections: Not on file  Intimate Partner Violence: Not on file    Family History  Problem Relation Age of Onset   Heart disease Mother    Sudden death Mother    Diabetes Father    Stroke Father    Bone cancer Sister    Sudden death Brother    Melanoma Brother    Heart disease Brother    Colon cancer Neg Hx    Esophageal cancer Neg Hx    Inflammatory bowel disease Neg Hx  Liver disease Neg Hx    Pancreatic cancer Neg Hx    Rectal cancer Neg Hx    Stomach cancer Neg Hx     ROS- All systems are reviewed and negative except as per the HPI above  Physical Exam: Vitals:   07/10/22 0924  BP: 116/76  Pulse: 64  Weight: 95.2 kg  Height: '5\' 5"'$  (1.651 m)     Wt Readings from Last 3 Encounters:  07/10/22 95.2 kg  06/27/22 93.4 kg  06/05/22 92.2 kg    Labs: Lab Results  Component Value Date   NA 142 05/21/2022   K 3.6 05/21/2022   CL 106 05/21/2022   CO2 27 05/21/2022   GLUCOSE 98 05/21/2022   BUN 22 05/21/2022   CREATININE 1.04 (H) 05/21/2022   CALCIUM 10.1 05/21/2022   MG 2.6 (H) 03/24/2022   Lab Results  Component Value Date   INR 1.3 (H) 08/18/2019   Lab Results  Component Value Date   CHOL 141 05/09/2022   HDL 42 05/09/2022   LDLCALC 78 05/09/2022   TRIG 116 05/09/2022    GEN- The patient is a well appearing elderly obese female, alert and oriented x 3 today.   HEENT-head normocephalic, atraumatic, sclera clear, conjunctiva pink, hearing intact, trachea midline. Lungs- Clear to ausculation bilaterally, normal work of breathing Heart- irregular rate and rhythm, no murmurs, rubs or gallops  GI- soft, NT, ND, + BS Extremities- no clubbing, cyanosis, or edema MS- no significant deformity or atrophy Skin- no rash or lesion Psych- euthymic mood, full affect Neuro- strength and sensation are intact   EKG- Afib Vent. rate 64 BPM PR interval * ms QRS duration 92 ms QT/QTcB 402/414 ms   Epic records reviewed    Assessment and Plan:  1.  Persistent Afib S/p afib ablation 07/28/21 S/p DCCV 05/31/22 which was unsuccessful, now loaded on amiodarone. Will plan for repeat DCCV now that she is on amiodarone.  Continue amiodarone 200 mg daily She has follow up with Dr Curt Bears to discuss repeat ablation.  Continue carvedilol 25 mg BID Continue Eliquis 5 mg BID  2. CHA2DS2VASc of at least 5 Continue Eliquis as above.  3. HTN Stable, no changes today.  4. CAD No anginal symptoms.  5. OSA Followed by Dr Claiborne Billings. Encouraged compliance with CPAP therapy.   Follow up with Dr Curt Bears as scheduled.    [facility] [address] [place], [place] [phone]

## 2022-07-10 NOTE — Patient Instructions (Signed)
Cardioversion scheduled for Wednesday, August 9th  - Arrive at the [facility] and go to admitting at [place] not eat or drink anything after midnight the night prior to your procedure.  - Take all your morning medication (except diabetic medications) with a sip of water prior to arrival.  - You will not be able to drive home after your procedure.  - Do NOT miss any doses of your blood thinner - if you should miss a dose please notify our office immediately.  - If you feel as if you go back into normal rhythm prior to scheduled cardioversion, please notify our office immediately. If your procedure is canceled in the cardioversion suite you will be charged a cancellation fee.

## 2022-07-10 NOTE — H&P (View-Only) (Signed)
Primary Care Physician: Marcellina Millin Referring Physician: Dr. Roxine Caddy Claudia Cantrell is a 76 y.o. female with a h/o atrial fibrillation, CAD, OSA, HTN who presents for follow up in the [facility]. Patient is s/p afib ablation with Dr Curt Bears on 07/28/21. She had done well with no know episodes of afib until she presented to the ED on 03/24/22 with chest pain. She tested positive for COVID at that time. ECG showed rate controlled afib. Seen by Dr Claiborne Billings on 05/15/22 and she was still in afib, her carvedilol was increased.   Patient is s/p DCCV 05/31/22 which was unsuccessful after 4 shocks. She has symptoms of fatigue and exercise intolerance when in afib. She was started on amiodarone as a bridge to possible repeat ablation.   On follow up today, patient reports that she has noticed a small improvement in her fatigue since starting amiodarone. She remains in rate controlled afib. No bleeding issues on anticoagulation.   Today, she denies symptoms of palpitations, chest pain, shortness of breath, orthopnea, PND, lower extremity edema, dizziness, presyncope, syncope, or neurologic sequela. The patient is tolerating medications without difficulties and is otherwise without complaint today.   Past Medical History:  Diagnosis Date   Arthritis    rheumatoid   CAD (coronary artery disease)    a. 1992 s/p MI and PTCA of unknown vessel;  b. 09/2010 Cath: LM nl, LAD 20p, LCX 72m RCA 130mRPL 20.   Cervical cancer ([facility]   Chronic kidney disease    Family history of anesthesia complication    daughter has difficulty waking    GERD (gastroesophageal reflux disease)    occ   Gout 10/08/2016   Hyperlipidemia    Hypertensive heart disease    Hypothyroidism    Nonischemic cardiomyopathy ([facility]   a. 07/2016 Echo: EF 40-45%, mild LVH, inferior akinesis, moderately dilated left atrium, trivial AI and MR.   PAF (paroxysmal atrial fibrillation) ([facility]   a. 07/2016  Admitted w/ AF RVR-->CHA2DS2VASc = 5-->Eliquis;  b. 07/2016 successful TEE/DCCV.   Sleep apnea    a. Using CPAP.   Past Surgical History:  Procedure Laterality Date   ABDOMINAL HYSTERECTOMY  1988   ATRIAL FIBRILLATION ABLATION N/A 07/28/2021   Procedure: ATRIAL FIBRILLATION ABLATION;  Surgeon: CaConstance HawMD;  [facility];  Service: Cardiovascular;  Laterality: N/A;   [place]  09/02/2019   Procedure: BIOPSY;  Surgeon: MaRush LandmarkaTelford Nab MD;  [facility];  Service: Gastroenterology;;   BIOPSY  12/09/2019   Procedure: BIOPSY;  Surgeon: MaIrving Copas MD;  [facility];  Service: Gastroenterology;;   BIOPSY  03/08/2021   Procedure: BIOPSY;  Surgeon: MaIrving Copas MD;  [facility];  Service: Gastroenterology;;   [place] PTCA BY DR [place]/A 08/01/2016   Procedure: CARDIOVERSION;  Surgeon: BrLelon PerlaMD;  [facility];  Service: Cardiovascular;  Laterality: N/A;   CARDIOVERSION N/A 01/11/2020   Procedure: CARDIOVERSION;  Surgeon: NiJosue HectorMD;  [facility];  Service: Cardiovascular;  Laterality: N/A;   CARDIOVERSION N/A 07/08/2020   Procedure: CARDIOVERSION;  Surgeon: ScDonato HeinzMD;  [facility]: [facility];  Laterality: N/A;   CARDIOVERSION N/A 05/31/2022   Procedure: CARDIOVERSION;  Surgeon: HiPixie CasinoMD;  [facility] Service: Cardiovascular;  Laterality:  N/A;   ENDOSCOPIC MUCOSAL RESECTION N/A 12/09/2019   Procedure: ENDOSCOPIC MUCOSAL RESECTION;  Surgeon: Rush Landmark Telford Nab., MD;  [facility];  Service: Gastroenterology;  Laterality: N/A;   ESOPHAGOGASTRODUODENOSCOPY N/A 03/08/2021   Procedure: ESOPHAGOGASTRODUODENOSCOPY (EGD);  Surgeon: Irving Copas., MD;  [facility];  Service: Gastroenterology;   Laterality: N/A;   ESOPHAGOGASTRODUODENOSCOPY (EGD) WITH PROPOFOL N/A 09/02/2019   Procedure: ESOPHAGOGASTRODUODENOSCOPY (EGD) WITH PROPOFOL;  Surgeon: Rush Landmark Telford Nab., MD;  [facility];  Service: Gastroenterology;  Laterality: N/A;   ESOPHAGOGASTRODUODENOSCOPY (EGD) WITH PROPOFOL N/A 12/09/2019   Procedure: ESOPHAGOGASTRODUODENOSCOPY (EGD) WITH PROPOFOL;  Surgeon: Rush Landmark Telford Nab., MD;  [facility];  Service: Gastroenterology;  Laterality: N/A;   EUS N/A 09/02/2019   Procedure: UPPER ENDOSCOPIC ULTRASOUND (EUS) RADIAL;  Surgeon: Irving Copas., MD;  [facility];  Service: Gastroenterology;  Laterality: N/A;  EUS radial/linear   EUS N/A 12/09/2019   Procedure: UPPER ENDOSCOPIC ULTRASOUND (EUS) RADIAL;  Surgeon: Irving Copas., MD;  [facility];  Service: Gastroenterology;  Laterality: N/A;   EUS  12/09/2019   Procedure: UPPER ENDOSCOPIC ULTRASOUND (EUS) LINEAR;  Surgeon: Irving Copas., MD;  [facility];  Service: Gastroenterology;;   EUS N/A 03/08/2021   Procedure: UPPER ENDOSCOPIC ULTRASOUND (EUS) RADIAL;  Surgeon: Irving Copas., MD;  [facility];  Service: Gastroenterology;  Laterality: N/A;   FINE NEEDLE ASPIRATION  09/02/2019   Procedure: FINE NEEDLE ASPIRATION (FNA) LINEAR;  Surgeon: Irving Copas., MD;  [facility];  Service: Gastroenterology;;   FINE NEEDLE ASPIRATION  12/09/2019   Procedure: FINE NEEDLE ASPIRATION (FNA) LINEAR;  Surgeon: Irving Copas., MD;  [facility];  Service: Gastroenterology;;   HEMOSTASIS CLIP PLACEMENT  12/09/2019   Procedure: HEMOSTASIS CLIP PLACEMENT;  Surgeon: Irving Copas., MD;  [facility];  Service: Gastroenterology;;   ORIF ANKLE FRACTURE Right 04/27/2015   Procedure: OPEN REDUCTION INTERNAL FIXATION (ORIF) RIGHT BIMALLEOLAR ANKLE FRACTURE WITH SYNDESMOSIS FIXATION;  Surgeon: Leandrew Koyanagi, MD;  Location: [place];  Service: Orthopedics;  Laterality: Right;   SUBMUCOSAL LIFTING INJECTION  12/09/2019   Procedure: SUBMUCOSAL LIFTING INJECTION;  Surgeon: Irving Copas., MD;  [facility];  Service: Gastroenterology;;   TEE WITHOUT CARDIOVERSION N/A 08/01/2016   Procedure: TRANSESOPHAGEAL ECHOCARDIOGRAM (TEE);  Surgeon: Lelon Perla, MD;  Location[facility];  Service: Cardiovascular;  Laterality: N/A;   THYROIDECTOMY  11/24/2013   DR Dalbert Batman   THYROIDECTOMY N/A 11/24/2013   Procedure: TOTAL THYROIDECTOMY;  Surgeon: Adin Hector, MD;  Location: [place];  Service: General;  Laterality: N/A;   TRANSTHORACIC ECHOCARDIOGRAM  01/15/2013   EF 55% TO 65%. PROBABLE MILD HYPOKINESIS OF THE INFERIOR MYOCARDIUM. GRADE 1 DIASTOLIC DYSFUNCTION. TRIAL AR.LA IS MILDLY DILATED.    Current Outpatient Medications  Medication Sig Dispense Refill   acetaminophen (TYLENOL) 500 MG tablet Take 1,000 mg by mouth every 6 (six) hours as needed for moderate pain.     allopurinol (ZYLOPRIM) 100 MG tablet Take 1 tablet (100 mg total) by mouth in the morning. 90 tablet 0   amiodarone (PACERONE) 200 MG tablet Take 1 tablet (200 mg total) by mouth 2 (two) times daily for 30 days, THEN 1 tablet (200 mg total) daily. 60 tablet 6   amLODipine (NORVASC) 5 MG tablet TAKE 1 & 1/2 (ONE & ONE-HALF) TABLETS BY MOUTH IN THE EVENING 135 tablet 1   apixaban (ELIQUIS) 5 MG TABS tablet Take 1 tablet by mouth twice daily 180  tablet 1   FARXIGA 10 MG TABS tablet Take 10 mg by mouth in the morning.     furosemide (LASIX) 20 MG tablet Take 20 mg by mouth as needed.     hydrochlorothiazide (MICROZIDE) 12.5 MG capsule Take 12.5 mg by mouth every morning.     isosorbide dinitrate (ISORDIL) 5 MG tablet Take 1 tablet by mouth twice daily 180 tablet 1   Magnesium 400 MG TABS Take 400 mg by mouth 2 (two) times a week.     Multiple Vitamins-Minerals (MULTIVITAMIN WITH MINERALS) tablet Take 1 tablet by mouth  daily.     Probiotic Product (PROBIOTIC PO) Take 1 capsule by mouth 2 (two) times a week.     rosuvastatin (CRESTOR) 40 MG tablet Take 1 tablet by mouth once daily 90 tablet 3   SYNTHROID 100 MCG tablet Take 1 tablet (100 mcg total) by mouth daily before breakfast. 90 tablet 3   Vitamin D3 (VITAMIN D) 25 MCG tablet Take 1,000 Units by mouth 2 (two) times a week.     zinc gluconate 50 MG tablet Take 50 mg by mouth 2 (two) times a week.     carvedilol (COREG) 12.5 MG tablet Take 2 tablets (25 mg total) by mouth 2 (two) times daily.     losartan (COZAAR) 25 MG tablet Take 1 tablet (25 mg total) by mouth at bedtime. 90 tablet 3   tolterodine (DETROL) 1 MG tablet Take 1 tablet (1 mg total) by mouth 2 (two) times daily. (Patient not taking: Reported on 07/10/2022) 60 tablet 3   No current facility-administered medications for this encounter.    Allergies  Allergen Reactions   Labetalol     headaches   Codeine Anxiety    Social History   Socioeconomic History   Marital status: Widowed    Spouse name: Not on file   Number of children: 3   Years of education: Not on file   Highest education level: Not on file  Occupational History   Occupation: retired  Tobacco Use   Smoking status: Former    Packs/day: 1.00    Years: 15.00    Total pack years: 15.00    Types: Cigarettes    Quit date: 12/10/1990    Years since quitting: 31.6    Passive exposure: Never   Smokeless tobacco: Never   Tobacco comments:    Former smoker 05/21/22  Vaping Use   Vaping Use: Never used  Substance and Sexual Activity   Alcohol use: Yes    Comment: very rare   Drug use: No   Sexual activity: Not on file  Other Topics Concern   Not on file  Social History Narrative   Right handed    Living  alone.   Social Determinants of Health   Financial Resource Strain: Not on file  Food Insecurity: Not on file  Transportation Needs: Not on file  Physical Activity: Not on file  Stress: Not on file  Social  Connections: Not on file  Intimate Partner Violence: Not on file    Family History  Problem Relation Age of Onset   Heart disease Mother    Sudden death Mother    Diabetes Father    Stroke Father    Bone cancer Sister    Sudden death Brother    Melanoma Brother    Heart disease Brother    Colon cancer Neg Hx    Esophageal cancer Neg Hx    Inflammatory bowel disease Neg Hx  Liver disease Neg Hx    Pancreatic cancer Neg Hx    Rectal cancer Neg Hx    Stomach cancer Neg Hx     ROS- All systems are reviewed and negative except as per the HPI above  Physical Exam: Vitals:   07/10/22 0924  BP: 116/76  Pulse: 64  Weight: 95.2 kg  Height: '5\' 5"'$  (1.651 m)     Wt Readings from Last 3 Encounters:  07/10/22 95.2 kg  06/27/22 93.4 kg  06/05/22 92.2 kg    Labs: Lab Results  Component Value Date   NA 142 05/21/2022   K 3.6 05/21/2022   CL 106 05/21/2022   CO2 27 05/21/2022   GLUCOSE 98 05/21/2022   BUN 22 05/21/2022   CREATININE 1.04 (H) 05/21/2022   CALCIUM 10.1 05/21/2022   MG 2.6 (H) 03/24/2022   Lab Results  Component Value Date   INR 1.3 (H) 08/18/2019   Lab Results  Component Value Date   CHOL 141 05/09/2022   HDL 42 05/09/2022   LDLCALC 78 05/09/2022   TRIG 116 05/09/2022    GEN- The patient is a well appearing elderly obese female, alert and oriented x 3 today.   HEENT-head normocephalic, atraumatic, sclera clear, conjunctiva pink, hearing intact, trachea midline. Lungs- Clear to ausculation bilaterally, normal work of breathing Heart- irregular rate and rhythm, no murmurs, rubs or gallops  GI- soft, NT, ND, + BS Extremities- no clubbing, cyanosis, or edema MS- no significant deformity or atrophy Skin- no rash or lesion Psych- euthymic mood, full affect Neuro- strength and sensation are intact   EKG- Afib Vent. rate 64 BPM PR interval * ms QRS duration 92 ms QT/QTcB 402/414 ms   Epic records reviewed    Assessment and Plan:  1.  Persistent Afib S/p afib ablation 07/28/21 S/p DCCV 05/31/22 which was unsuccessful, now loaded on amiodarone. Will plan for repeat DCCV now that she is on amiodarone.  Continue amiodarone 200 mg daily She has follow up with Dr Curt Bears to discuss repeat ablation.  Continue carvedilol 25 mg BID Continue Eliquis 5 mg BID  2. CHA2DS2VASc of at least 5 Continue Eliquis as above.  3. HTN Stable, no changes today.  4. CAD No anginal symptoms.  5. OSA Followed by Dr Claiborne Billings. Encouraged compliance with CPAP therapy.   Follow up with Dr Curt Bears as scheduled.    [facility] [address] [place], [place] [address] [phone]

## 2022-07-11 ENCOUNTER — Encounter (HOSPITAL_COMMUNITY): Payer: Self-pay | Admitting: Cardiology

## 2022-07-11 NOTE — Progress Notes (Signed)
Attempted to obtain medical history via telephone, unable to reach at this time. HIPAA compliant voicemail message left requesting return call to pre surgical testing department. 

## 2022-07-12 ENCOUNTER — Telehealth: Payer: Self-pay

## 2022-07-12 NOTE — Telephone Encounter (Signed)
Received fax information from [employer] that advised they were unsuccessful in making contact with her for sleep supplies.   Will scan into chart.

## 2022-07-13 LAB — HM DEXA SCAN: HM Dexa Scan: NORMAL

## 2022-07-18 ENCOUNTER — Ambulatory Visit (HOSPITAL_COMMUNITY): Payer: Medicare Other | Admitting: Certified Registered Nurse Anesthetist

## 2022-07-18 ENCOUNTER — Other Ambulatory Visit: Payer: Self-pay

## 2022-07-18 ENCOUNTER — Ambulatory Visit (HOSPITAL_COMMUNITY)
Admission: RE | Admit: 2022-07-18 | Discharge: 2022-07-18 | Disposition: A | Discharge status: Home / Self Care | Payer: Medicare Other | Attending: Cardiology | Admitting: Cardiology

## 2022-07-18 ENCOUNTER — Ambulatory Visit (HOSPITAL_BASED_OUTPATIENT_CLINIC_OR_DEPARTMENT_OTHER): Payer: Medicare Other | Admitting: Certified Registered Nurse Anesthetist

## 2022-07-18 ENCOUNTER — Encounter (HOSPITAL_COMMUNITY)
Admission: RE | Disposition: A | Discharge status: Home / Self Care | Payer: Self-pay | Source: Home / Self Care | Attending: Cardiology

## 2022-07-18 ENCOUNTER — Encounter (HOSPITAL_COMMUNITY): Payer: Self-pay | Admitting: Cardiology

## 2022-07-18 DIAGNOSIS — Z7901 Long term (current) use of anticoagulants: Secondary | ICD-10-CM | POA: Insufficient documentation

## 2022-07-18 DIAGNOSIS — Z79899 Other long term (current) drug therapy: Secondary | ICD-10-CM | POA: Insufficient documentation

## 2022-07-18 DIAGNOSIS — I4819 Other persistent atrial fibrillation: Secondary | ICD-10-CM | POA: Diagnosis present

## 2022-07-18 DIAGNOSIS — I10 Essential (primary) hypertension: Secondary | ICD-10-CM | POA: Diagnosis not present

## 2022-07-18 DIAGNOSIS — I4891 Unspecified atrial fibrillation: Secondary | ICD-10-CM

## 2022-07-18 DIAGNOSIS — Z87891 Personal history of nicotine dependence: Secondary | ICD-10-CM | POA: Diagnosis not present

## 2022-07-18 DIAGNOSIS — G4733 Obstructive sleep apnea (adult) (pediatric): Secondary | ICD-10-CM | POA: Diagnosis not present

## 2022-07-18 DIAGNOSIS — I110 Hypertensive heart disease with heart failure: Secondary | ICD-10-CM

## 2022-07-18 DIAGNOSIS — I2510 Atherosclerotic heart disease of native coronary artery without angina pectoris: Secondary | ICD-10-CM | POA: Insufficient documentation

## 2022-07-18 DIAGNOSIS — I509 Heart failure, unspecified: Secondary | ICD-10-CM | POA: Diagnosis not present

## 2022-07-18 HISTORY — PX: CARDIOVERSION: SHX1299

## 2022-07-18 SURGERY — CARDIOVERSION
Anesthesia: Monitor Anesthesia Care

## 2022-07-18 MED ORDER — PROPOFOL 10 MG/ML IV BOLUS
INTRAVENOUS | Status: DC | PRN
  Administered 2022-07-18: 20 mg via INTRAVENOUS
  Administered 2022-07-18: 60 mg via INTRAVENOUS

## 2022-07-18 MED ORDER — AMIODARONE HCL 200 MG PO TABS: 200.0000 mg | ORAL_TABLET | Freq: Every day | ORAL | 3 refills | Status: DC

## 2022-07-18 MED ORDER — CARVEDILOL 6.25 MG PO TABS: 6.2500 mg | ORAL_TABLET | Freq: Two times a day (BID) | ORAL | 3 refills | Status: DC

## 2022-07-18 MED ORDER — LIDOCAINE 2% (20 MG/ML) 5 ML SYRINGE
INTRAMUSCULAR | Status: DC | PRN
  Administered 2022-07-18: 40 mg via INTRAVENOUS

## 2022-07-18 MED ORDER — SODIUM CHLORIDE 0.9 % IV SOLN: INTRAVENOUS | Status: DC | PRN

## 2022-07-18 NOTE — CV Procedure (Addendum)
Procedure:   DCCV  Indication:  Symptomatic atrial fibrillation  Procedure Note:  The patient signed informed consent.  They have had had therapeutic anticoagulation with Eliquis greater than 3 weeks.  Anesthesia was administered by Dr. Nyoka Cowden and Reather Laurence, CRNA.  Adequate airway was maintained throughout and vital followed per protocol.  They were cardioverted x 1 with 200J of biphasic synchronized energy.  They converted to sinus rhythm with rate 40s.  Will decrease coreg dose to 6.25 mg BID.  She has also been taking amiodarone '200mg'$  BID, though plan was to reduce to '200mg'$  once daily last month, will reduce to '200mg'$  daily  There were no apparent complications.  The patient had normal neuro status and respiratory status post procedure with vitals stable as recorded elsewhere.    Follow up:  They will continue on current medical therapy and follow up with cardiology as scheduled.  Oswaldo Milian, MD 07/18/2022 10:47 AM

## 2022-07-18 NOTE — Transfer of Care (Signed)
Immediate Anesthesia Transfer of Care Note  Patient: Claudia Cantrell  Procedure(s) Performed: CARDIOVERSION  Patient Location: Endoscopy Unit  Anesthesia Type:General  Level of Consciousness: drowsy and patient cooperative  Airway & Oxygen Therapy: Patient Spontanous Breathing  Post-op Assessment: Report given to RN and Post -op Vital signs reviewed and stable  Post vital signs: Reviewed and stable  Last Vitals:  Vitals Value Taken Time  BP 110/67   Temp    Pulse 45   Resp 15   SpO2 100%     Last Pain:  Vitals:   07/18/22 1004  TempSrc: Oral  PainSc: 0-No pain         Complications: No notable events documented.

## 2022-07-18 NOTE — Anesthesia Procedure Notes (Signed)
Procedure Name: General with mask airway Date/Time: 07/18/2022 10:38 AM  Performed by: Janene Harvey, CRNAPre-anesthesia Checklist: Patient identified, Emergency Drugs available, Suction available and Patient being monitored Patient Re-evaluated:Patient Re-evaluated prior to induction Oxygen Delivery Method: Ambu bag Induction Type: IV induction Placement Confirmation: positive ETCO2 Dental Injury: Teeth and Oropharynx as per pre-operative assessment

## 2022-07-18 NOTE — Anesthesia Postprocedure Evaluation (Signed)
Anesthesia Post Note  Patient: Claudia Cantrell  Procedure(s) Performed: CARDIOVERSION     Patient location during evaluation: Endoscopy Anesthesia Type: MAC Level of consciousness: awake Pain management: pain level controlled Vital Signs Assessment: post-procedure vital signs reviewed and stable Respiratory status: spontaneous breathing Cardiovascular status: stable Postop Assessment: no apparent nausea or vomiting Anesthetic complications: no   No notable events documented.  Last Vitals:  Vitals:   07/18/22 1112 07/18/22 1124  BP: 112/62 (!) 116/57  Pulse: (!) 45 (!) 45  Resp: 16 18  Temp:    SpO2: 95% 95%    Last Pain:  Vitals:   07/18/22 1124  TempSrc:   PainSc: 0-No pain                 Demtrius Rounds

## 2022-07-18 NOTE — Discharge Instructions (Signed)

## 2022-07-18 NOTE — Interval H&P Note (Signed)
History and Physical Interval Note:  07/18/2022 10:35 AM  Claudia Cantrell  has presented today for surgery, with the diagnosis of atrial fibrillation.  The various methods of treatment have been discussed with the patient and family. After consideration of risks, benefits and other options for treatment, the patient has consented to  Procedure(s): CARDIOVERSION (N/A) as a surgical intervention.  The patient's history has been reviewed, patient examined, no change in status, stable for surgery.  I have reviewed the patient's chart and labs.  Questions were answered to the patient's satisfaction.     [name]

## 2022-07-18 NOTE — Anesthesia Preprocedure Evaluation (Signed)
Anesthesia Evaluation  Patient identified by MRN, date of birth, ID band Patient awake    Reviewed: Allergy & Precautions, NPO status , Patient's Chart, lab work & pertinent test results  Airway Mallampati: II  TM Distance: >3 FB     Dental   Pulmonary former smoker,    breath sounds clear to auscultation       Cardiovascular hypertension, + CAD and +CHF   Rhythm:Irregular Rate:Normal     Neuro/Psych    GI/Hepatic GERD  ,  Endo/Other  Hypothyroidism   Renal/GU Renal disease     Musculoskeletal   Abdominal   Peds  Hematology   Anesthesia Other Findings   Reproductive/Obstetrics                             Anesthesia Physical Anesthesia Plan  ASA: 3  Anesthesia Plan: MAC   Post-op Pain Management:    Induction:   PONV Risk Score and Plan: Treatment may vary due to age or medical condition  Airway Management Planned: Nasal Cannula and Simple Face Mask  Additional Equipment:   Intra-op Plan:   Post-operative Plan:   Informed Consent: I have reviewed the patients History and Physical, chart, labs and discussed the procedure including the risks, benefits and alternatives for the proposed anesthesia with the patient or authorized representative who has indicated his/her understanding and acceptance.     Dental advisory given  Plan Discussed with: CRNA and Anesthesiologist  Anesthesia Plan Comments:         Anesthesia Quick Evaluation

## 2022-07-21 ENCOUNTER — Encounter (HOSPITAL_COMMUNITY): Payer: Self-pay | Admitting: Cardiology

## 2022-07-23 ENCOUNTER — Other Ambulatory Visit: Payer: Self-pay | Admitting: Cardiovascular Disease

## 2022-08-03 ENCOUNTER — Ambulatory Visit: Payer: Medicare Other | Admitting: Cardiology

## 2022-08-03 ENCOUNTER — Encounter: Payer: Self-pay | Admitting: Cardiology

## 2022-08-03 VITALS — BP 132/72 | HR 45 | Ht 64.0 in | Wt 206.0 lb

## 2022-08-03 DIAGNOSIS — D6869 Other thrombophilia: Secondary | ICD-10-CM | POA: Diagnosis not present

## 2022-08-03 DIAGNOSIS — I4819 Other persistent atrial fibrillation: Secondary | ICD-10-CM

## 2022-08-03 NOTE — Progress Notes (Signed)
Office Visit Note  Patient: Claudia Cantrell             Date of Birth: [birth year]           MRN: [id number]             PCP: Berneta Levins, DO Referring: Marcellina Millin Visit Date: 08/16/2022 Occupation: '@GUAROCC'$ @  Subjective:  Discuss restarting allopurinol   History of Present Illness: Claudia Cantrell is a 76 y.o. female with history of gout.  Patient was previously taking allopurinol 100 mg daily.  She discontinued allopurinol on her own after her last office visit on 02/14/2022.  Patient reports that she takes a lot of medications and was trying to reduce her medication list and discontinued allopurinol since she had not had a gout flare in over 4 years.  She denies any signs or symptoms of a gout flare since discontinuing allopurinol.  She has occasional discomfort in her left shoulder joint but denies any other joint pain or joint swelling at this time.  She has not needed to take prednisone and has not taking colchicine since 2019.    Activities of Daily Living:  Patient reports morning stiffness for a few minutes.   Patient Reports nocturnal pain.  Difficulty dressing/grooming: Denies Difficulty climbing stairs: Reports Difficulty getting out of chair: Denies Difficulty using hands for taps, buttons, cutlery, and/or writing: Denies  Review of Systems  Constitutional:  Negative for fatigue.  HENT:  Negative for mouth sores and mouth dryness.   Eyes:  Positive for dryness.  Respiratory:  Negative for shortness of breath.   Cardiovascular:  Negative for chest pain and palpitations.  Gastrointestinal:  Negative for blood in stool, constipation and diarrhea.  Endocrine: Negative for increased urination.  Genitourinary:  Negative for involuntary urination.  Musculoskeletal:  Positive for joint pain, joint pain, muscle weakness and morning stiffness. Negative for gait problem, joint swelling, myalgias, muscle tenderness and myalgias.  Skin:  Negative for color change,  rash, hair loss and sensitivity to sunlight.  Allergic/Immunologic: Negative for susceptible to infections.  Neurological:  Negative for dizziness and headaches.  Hematological:  Negative for swollen glands.  Psychiatric/Behavioral:  Negative for depressed mood and sleep disturbance. The patient is not nervous/anxious.     PMFS History:  Patient Active Problem List   Diagnosis Date Noted  . Postoperative hypothyroidism 06/28/2022  . Secondary hypercoagulable state ([place]) 05/21/2022  . Overactive bladder 05/13/2022  . Mixed hyperlipidemia 05/09/2022  . 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 Acquired hypothyroidism 10/30/2021  . 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 Iatrogenic hyperthyroidism 05/02/2020  . Dyslipidemia, goal LDL below 70 05/02/2020  . Acute combined systolic and diastolic CHF, NYHA class 3 ([place]) 09/23/2019  . Persistent atrial fibrillation ([place]) 09/21/2019  . Submucosal lesion of stomach 07/19/2019  . Abnormal CT of the abdomen 07/19/2019  . Abnormal CT scan, stomach 06/17/2019  . Advance directive declined by patient 01/14/2019  . Chronic anticoagulation 01/14/2019  . CRI (chronic renal insufficiency), stage 2 (mild) 01/14/2019  . Prediabetes 01/14/2019  . Persistent proteinuria 02/17/2018  . 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 Vitamin D deficiency 03/26/2017  . Medication monitoring encounter 03/26/2017  . Gout 10/08/2016  . Essential hypertension   . CAD S/P percutaneous coronary angioplasty   . Nonischemic cardiomyopathy ([place])   . Chest pain with moderate risk for cardiac etiology 08/02/2016  . Closed right  ankle fracture 04/24/2015  . Ankle fracture, bimalleolar, closed 04/24/2015  . 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 OSA on CPAP 10/09/2013    Past Medical History:  Diagnosis Date  . Arthritis    rheumatoid  . CAD (coronary artery disease)     a. 1992 s/p MI and PTCA of unknown vessel;  b. 09/2010 Cath: LM nl, LAD 20p, LCX 40m RCA 112mRPL 20.  . Marland Kitchenervical cancer ([place]  . Chronic kidney disease   . Family history of anesthesia complication    daughter has difficulty waking   . GERD (gastroesophageal reflux disease)    occ  . Gout 10/08/2016  . Hyperlipidemia   . Hypertensive heart disease   . Hypothyroidism   . Nonischemic cardiomyopathy ([place]   a. 07/2016 Echo: EF 40-45%, mild LVH, inferior akinesis, moderately dilated left atrium, trivial AI and MR.  . Marland KitchenAF (paroxysmal atrial fibrillation) ([place]   a. 07/2016 Admitted w/ AF RVR-->CHA2DS2VASc = 5-->Eliquis;  b. 07/2016 successful TEE/DCCV.  . Marland Kitchenleep apnea    a. Using CPAP.    Family History  Problem Relation Age of Onset  . Heart disease Mother   . Sudden death Mother   . Diabetes Father   . Stroke Father   . Bone cancer Sister   . Sudden death Brother   . Melanoma Brother   . Heart disease Brother   . Colon cancer Neg Hx   . Esophageal cancer Neg Hx   . Inflammatory bowel disease Neg Hx   . Liver disease Neg Hx   . Pancreatic cancer Neg Hx   . Rectal cancer Neg Hx   . Stomach cancer Neg Hx    Past Surgical History:  Procedure Laterality Date  . ABDOMINAL HYSTERECTOMY  1988  . ATRIAL FIBRILLATION ABLATION N/A 07/28/2021   Procedure: ATRIAL FIBRILLATION ABLATION;  Surgeon: CaConstance HawMD;  [facility];  Service: Cardiovascular;  Laterality: N/A;  . BACK SURGERY  1994  . BIOPSY  09/02/2019   Procedure: BIOPSY;  Surgeon: MaRush LandmarkaTelford Nab MD;  [facility];  Service: Gastroenterology;;  . BIOPSY  12/09/2019   Procedure: BIOPSY;  Surgeon: MaIrving Copas MD;  [facility];  Service: Gastroenterology;;  . BIOPSY  03/08/2021   Procedure: BIOPSY;  Surgeon: MaIrving Copas MD;  [facility];  Service: Gastroenterology;;  . CAH[place] PTCA BY DR CHLamount Cohen. CARDIOVERSION N/A 08/01/2016   Procedure: CARDIOVERSION;  Surgeon: BrLelon PerlaMD;  [place] Service: Cardiovascular;  Laterality: N/A;  . CARDIOVERSION N/A 01/11/2020   Procedure: CARDIOVERSION;  Surgeon: NiJosue HectorMD;  [facility];  Service: Cardiovascular;  Laterality: N/A;  . CARDIOVERSION N/A 07/08/2020   Procedure: CARDIOVERSION;  Surgeon: ScDonato HeinzMD;  [facility];  Service: Cardiovascular;  Laterality: N/A;  . CARDIOVERSION N/A 05/31/2022   Procedure: CARDIOVERSION;  Surgeon: HiPixie CasinoMD;  [facility];  Service: Cardiovascular;  Laterality: N/A;  . CARDIOVERSION N/A 07/18/2022   Procedure: CARDIOVERSION;  Surgeon: ScDonato HeinzMD;  [place] [facility] Service: Cardiovascular;  Laterality: N/A;  . ENDOSCOPIC MUCOSAL RESECTION N/A 12/09/2019   Procedure: ENDOSCOPIC MUCOSAL RESECTION;  Surgeon: MaIrving Copas MD;  [facility];  Service: Gastroenterology;  Laterality: N/A;  . ESOPHAGOGASTRODUODENOSCOPY N/A 03/08/2021   Procedure: ESOPHAGOGASTRODUODENOSCOPY (EGD);  Surgeon: MaIrving Copas MD;  [facility];  Service: Gastroenterology;  Laterality: N/A;  . ESOPHAGOGASTRODUODENOSCOPY (EGD) WITH PROPOFOL N/A 09/02/2019   Procedure: ESOPHAGOGASTRODUODENOSCOPY (EGD) WITH PROPOFOL;  Surgeon: Rush Landmark Telford Nab., MD;  [facility];  Service: Gastroenterology;  Laterality: N/A;  . ESOPHAGOGASTRODUODENOSCOPY (EGD) WITH PROPOFOL N/A 12/09/2019   Procedure: ESOPHAGOGASTRODUODENOSCOPY (EGD) WITH PROPOFOL;  Surgeon: Rush Landmark Telford Nab., MD;  [facility];  Service: Gastroenterology;  Laterality: N/A;  . EUS N/A 09/02/2019   Procedure: UPPER ENDOSCOPIC ULTRASOUND (EUS) RADIAL;  Surgeon: Rush Landmark Telford Nab., MD;  [facility];  Service: Gastroenterology;  Laterality: N/A;  EUS radial/linear  . EUS N/A 12/09/2019    Procedure: UPPER ENDOSCOPIC ULTRASOUND (EUS) RADIAL;  Surgeon: Irving Copas., MD;  [facility];  Service: Gastroenterology;  Laterality: N/A;  . EUS  12/09/2019   Procedure: UPPER ENDOSCOPIC ULTRASOUND (EUS) LINEAR;  Surgeon: Irving Copas., MD;  [facility];  Service: Gastroenterology;;  . EUS N/A 03/08/2021   Procedure: UPPER ENDOSCOPIC ULTRASOUND (EUS) RADIAL;  Surgeon: Irving Copas., MD;  [facility];  Service: Gastroenterology;  Laterality: N/A;  . FINE NEEDLE ASPIRATION  09/02/2019   Procedure: FINE NEEDLE ASPIRATION (FNA) LINEAR;  Surgeon: Irving Copas., MD;  [facility];  Service: Gastroenterology;;  . FINE NEEDLE ASPIRATION  12/09/2019   Procedure: FINE NEEDLE ASPIRATION (FNA) LINEAR;  Surgeon: Irving Copas., MD;  [facility];  Service: Gastroenterology;;  . HEMOSTASIS CLIP PLACEMENT  12/09/2019   Procedure: HEMOSTASIS CLIP PLACEMENT;  Surgeon: Irving Copas., MD;  [facility];  Service: Gastroenterology;;  . ORIF ANKLE FRACTURE Right 04/27/2015   Procedure: OPEN REDUCTION INTERNAL FIXATION (ORIF) RIGHT BIMALLEOLAR ANKLE FRACTURE WITH SYNDESMOSIS FIXATION;  Surgeon: Leandrew Koyanagi, MD;  Location: [place];  Service: Orthopedics;  Laterality: Right;  . SUBMUCOSAL LIFTING INJECTION  12/09/2019   Procedure: SUBMUCOSAL LIFTING INJECTION;  Surgeon: Rush Landmark Telford Nab., MD;  [facility];  Service: Gastroenterology;;  . TEE WITHOUT CARDIOVERSION N/A 08/01/2016   Procedure: TRANSESOPHAGEAL ECHOCARDIOGRAM (TEE);  Surgeon: Lelon Perla, MD;  [facility];  Service: Cardiovascular;  Laterality: N/A;  . THYROIDECTOMY  11/24/2013   DR Dalbert Batman  . THYROIDECTOMY N/A 11/24/2013   Procedure: TOTAL THYROIDECTOMY;  Surgeon: Adin Hector, MD;  Location: [place];  Service: General;  Laterality: N/A;  . TRANSTHORACIC ECHOCARDIOGRAM  01/15/2013   EF 55% TO 65%.  PROBABLE MILD HYPOKINESIS OF THE INFERIOR MYOCARDIUM. GRADE 1 DIASTOLIC DYSFUNCTION. TRIAL AR.LA IS MILDLY DILATED.   Social History   Social History Narrative   Right handed    Living  alone.   Immunization History  Administered Date(s) Administered  . Fluad Quad(high Dose 65+) 08/22/2019, 09/20/2021  . Influenza Nasal 11/25/2013  . Influenza, High Dose Seasonal PF 12/30/2014, 10/14/2017, 11/01/2018  . Influenza,inj,Quad PF,6+ Mos 11/25/2013  . Influenza-Unspecified 12/30/2012, 11/09/2015, 12/24/2016, 09/16/2020  . PFIZER(Purple Top)SARS-COV-2 Vaccination 12/29/2019, 01/18/2020, 09/16/2020  . Pension scheme manager 5y-11y 10/21/2021  . Pneumococcal Conjugate-13 12/30/2014, 10/14/2017  . Pneumococcal Polysaccharide-23 11/25/2013, 08/22/2019  . Zoster Recombinat (Shingrix) 10/14/2017, 01/21/2018  . Zoster, Live 12/15/2008     Objective: Vital Signs: BP (!) 145/86 (BP Location: Left Arm, Patient Position: Sitting, Cuff Size: Normal)   Pulse 60   Resp 15   Ht '5\' 4"'$  (1.626 m)   Wt 205 lb 6.4 oz (93.2 kg)   BMI 35.26 kg/m    Physical Exam Vitals and nursing note reviewed.  Constitutional:      Appearance: She is well-developed.  HENT:  Head: Normocephalic and atraumatic.  Eyes:     Conjunctiva/sclera: Conjunctivae normal.  Cardiovascular:     Rate and Rhythm: Normal rate and regular rhythm.     Heart sounds: Normal heart sounds.  Pulmonary:     Effort: Pulmonary effort is normal.     Breath sounds: Normal breath sounds.  Abdominal:     General: Bowel sounds are normal.     Palpations: Abdomen is soft.  Musculoskeletal:     Cervical back: Normal range of motion.  Skin:    General: Skin is warm and dry.     Capillary Refill: Capillary refill takes less than 2 seconds.  Neurological:     Mental Status: She is alert and oriented to person, place, and time.  Psychiatric:        Behavior: Behavior normal.     Musculoskeletal Exam: C-spine,  thoracic spine, and lumbar spine have good ROM.  No midline spinal tenderness or SI joint tenderness. Right shoulder has discomfort with ROM.  Left shoulder has full ROM with no discomfort.  Elbow joints, wrist joints, MCPs, PIPs, and DIPs good ROM with no synovitis.  Complete fist formation bilaterally.  Hip joints have good ROM with no groin pain.  Knee joints have good ROM with no warmth or effusion.  Ankle joints have good ROM with no tenderness or joint swelling.   CDAI Exam: CDAI Score: -- Patient Global: --; Provider Global: -- Swollen: --; Tender: -- Joint Exam 08/16/2022   No joint exam has been documented for this visit   There is currently no information documented on the homunculus. Go to the Rheumatology activity and complete the homunculus joint exam.  Investigation: No additional findings.  Imaging: No results found.  Recent Labs: Lab Results  Component Value Date   WBC 2.8 (L) 07/10/2022   HGB 15.6 (H) 07/10/2022   PLT 170 07/10/2022   NA 142 07/10/2022   K 4.1 07/10/2022   CL 109 07/10/2022   CO2 27 07/10/2022   GLUCOSE 86 07/10/2022   BUN 16 07/10/2022   CREATININE 1.64 (H) 07/10/2022   BILITOT 0.6 03/24/2022   ALKPHOS 78 03/24/2022   AST 21 03/24/2022   ALT 16 03/24/2022   PROT 8.5 (H) 03/24/2022   ALBUMIN 4.6 03/24/2022   CALCIUM 9.5 07/10/2022   GFRAA 74 09/16/2020    Speciality Comments: No specialty comments available.  Procedures:  No procedures performed Allergies: Labetalol and Codeine   Assessment / Plan:     Visit Diagnoses: Idiopathic chronic gout of multiple sites without tophus - Allopurinol-started 2019 : She has not had any signs or symptoms of a gout flare.  Her last gout flare was in 2019.  She was previously taking allopurinol 100 mg daily but discontinued after her last office visit on 02/14/2022.  She discontinued allopurinol on her own due to wanting to take less medications and since she had not had a gout flare in about 4 years.   She has not developed any signs or symptoms of a gout flare while off of allopurinol.  She has been trying to make dietary modifications and has been working on weight loss. She has not increased joint pain or inflammation on examination today.  Her uric acid was 2.8 on 02/14/2022.  We will recheck uric acid level today.  The plan is for her to reinitiate allopurinol as prophylaxis therapy. Discussed that there is a risk of having a flare while reinitiating allopurinol.  She is not a good candidate for  colchicine due to medication interaction with amiodarone. The plan is to resume low-dose allopurinol 50 mg daily for 1 week and then increase to 100 mg daily.  She will return for updated lab work including CBC, CMP, and uric acid in 1 month.  She will notify us if she develops signs or symptoms of a gout flare.  She will follow-up in the office in 3 months or sooner if needed.- Plan: Uric acid, CBC with Differential/Platelet, COMPLETE METABOLIC PANEL WITH GFR  Hyperuricemia - Uric acid will be rechecked today. Plan: Uric acid  Medication management - Allopurinol 100 mg 1 tablet by mouth daily.  CBC, CMP, and uric acid updated today.  She will return in 1 month to recheck lab work after reinitiating allopurinol as discussed above.   - Plan: CBC with Differential/Platelet, COMPLETE METABOLIC PANEL WITH GFR  Trigger finger, left ring finger: Resolved.   History of vitamin D deficiency: She is taking vitamin D 1000 units twice weekly.   Other medical conditions are listed as follows:   History of hypertension  History of coronary artery disease  Chronic anticoagulation  History of atrial fibrillation  History of hyperlipidemia  History of gastrointestinal stromal tumor (GIST)  History of hypothyroidism  OSA on CPAP  Orders: Orders Placed This Encounter  Procedures  . Uric acid  . CBC with Differential/Platelet  . COMPLETE METABOLIC PANEL WITH GFR   No orders of the defined types were  placed in this encounter.    Follow-Up Instructions: Return in about 3 months (around 11/15/2022) for Gout.   Ofilia Neas, PA-C  Note - This record has been created using Dragon software.  Chart creation errors have been sought, but may not always  have been located. Such creation errors do not reflect on  the standard of medical care.

## 2022-08-03 NOTE — Progress Notes (Signed)
Electrophysiology Office Note   Date:  08/03/2022   ID:  Claudia Cantrell, DOB [birth year], MRN [id number]  PCP:  Berneta Levins, DO  Cardiologist:  Claiborne Billings Primary Electrophysiologist:  Elizjah Noblet Meredith Leeds, MD    Chief Complaint: atrial fibrillation   History of Present Illness: Claudia Cantrell is a 76 y.o. female who is being seen today for the evaluation of atrial fibrillation at the request of Cantrell ref. provider found. Presenting today for electrophysiology evaluation.  She has a history of significant coronary artery disease, CKD, hypertension, hyperlipidemia, nonischemic cardiomyopathy, atrial fibrillation.  She has a history of MI.  She is post ablation for atrial fibrillation 07/28/2021.  Unfortunately back in atrial fibrillation.  She had an attempted cardioversion but did not convert.  She was loaded on amiodarone and had repeat cardioversion 07/18/2022.  She converted to sinus bradycardia.  Today, denies symptoms of palpitations, chest pain, shortness of breath, orthopnea, PND, lower extremity edema, claudication, dizziness, presyncope, syncope, bleeding, or neurologic sequela. The patient is tolerating medications without difficulties.    Past Medical History:  Diagnosis Date   Arthritis    rheumatoid   CAD (coronary artery disease)    a. 1992 s/p MI and PTCA of unknown vessel;  b. 09/2010 Cath: LM nl, LAD 20p, LCX 43m RCA 164mRPL 20.   [place] [facility] [place]   Chronic kidney disease    Family history of anesthesia complication    daughter has difficulty waking    GERD (gastroesophageal reflux disease)    occ   Gout 10/08/2016   Hyperlipidemia    Hypertensive heart disease    Hypothyroidism    Nonischemic cardiomyopathy ([facility]   a. 07/2016 Echo: EF 40-45%, mild LVH, inferior akinesis, moderately dilated left atrium, trivial AI and MR.   PAF ([place]oxysmal atrial fibrillation) [place] [place]   a. 07/2016 Admitted w/ AF RVR-->CHA2DS2VASc = 5-->Eliquis;  b. 07/2016 successful  TEE/DCCV.   Sleep apnea    a. Using CPAP.   Past Surgical History:  Procedure Laterality Date   ABDOMINAL HYSTERECTOMY  1988   ATRIAL FIBRILLATION ABLATION N/A 07/28/2021   Procedure: ATRIAL FIBRILLATION ABLATION;  Surgeon: CaConstance HawMD;  [place];  Service: Cardiovascular;  Laterality: N/A;   BADixon BIOPSY  09/02/2019   Procedure: BIOPSY;  Surgeon: MaRush LandmarkaTelford Nab MD;  [facility];  Service: Gastroenterology;;   BIOPSY  12/09/2019   Procedure: BIOPSY;  Surgeon: MaIrving Copas MD;  [facility];  Service: Gastroenterology;;   BIOPSY  03/08/2021   Procedure: BIOPSY;  Surgeon: MaIrving Copas MD;  [facility];  Service: Gastroenterology;;   CA[place]irk[place] PTCA BY DR CHBuckland/A 08/01/2016   Procedure: CARDIOVERSION;  Surgeon: BrLelon PerlaMD;  [facility];  Service: Cardiovascular;  Laterality: N/A;   CARDIOVERSION N/A 01/11/2020   Procedure: CARDIOVERSION;  Surgeon: NiJosue HectorMD;  [facility];  Service: Cardiovascular;  Laterality: N/A;   CARDIOVERSION N/A 07/08/2020   Procedure: CARDIOVERSION;  Surgeon: ScDonato HeinzMD;  [facility];  Service: Cardiovascular;  Laterality: N/A;   CARDIOVERSION N/A 05/31/2022   Procedure: CARDIOVERSION;  Surgeon: HiPixie CasinoMD;  [facility]: Cardiovascular;  Laterality: N/A;   CARDIOVERSION N/A 07/18/2022   Procedure: CARDIOVERSION;  Surgeon: ScDonato HeinzMD;  [facility] Service: Cardiovascular;  Laterality: N/A;  ENDOSCOPIC MUCOSAL RESECTION N/A 12/09/2019   Procedure: ENDOSCOPIC MUCOSAL RESECTION;  Surgeon: Rush Landmark Telford Nab., MD;  [facility];  Service: Gastroenterology;  Laterality: N/A;   ESOPHAGOGASTRODUODENOSCOPY N/A 03/08/2021   Procedure: ESOPHAGOGASTRODUODENOSCOPY (EGD);   Surgeon: Irving Copas., MD;  [facility];  Service: Gastroenterology;  Laterality: N/A;   ESOPHAGOGASTRODUODENOSCOPY (EGD) WITH PROPOFOL N/A 09/02/2019   Procedure: ESOPHAGOGASTRODUODENOSCOPY (EGD) WITH PROPOFOL;  Surgeon: Rush Landmark Telford Nab., MD;  [facility];  Service: Gastroenterology;  Laterality: N/A;   ESOPHAGOGASTRODUODENOSCOPY (EGD) WITH PROPOFOL N/A 12/09/2019   Procedure: ESOPHAGOGASTRODUODENOSCOPY (EGD) WITH PROPOFOL;  Surgeon: Rush Landmark Telford Nab., MD;  [facility];  Service: Gastroenterology;  Laterality: N/A;   EUS N/A 09/02/2019   Procedure: UPPER ENDOSCOPIC ULTRASOUND (EUS) RADIAL;  Surgeon: Irving Copas., MD;  [facility];  Service: Gastroenterology;  Laterality: N/A;  EUS radial/linear   EUS N/A 12/09/2019   Procedure: UPPER ENDOSCOPIC ULTRASOUND (EUS) RADIAL;  Surgeon: Irving Copas., MD;  [facility];  Service: Gastroenterology;  Laterality: N/A;   EUS  12/09/2019   Procedure: UPPER ENDOSCOPIC ULTRASOUND (EUS) LINEAR;  Surgeon: Irving Copas., MD;  [facility];  Service: Gastroenterology;;   EUS N/A 03/08/2021   Procedure: UPPER ENDOSCOPIC ULTRASOUND (EUS) RADIAL;  Surgeon: Irving Copas., MD;  [facility];  Service: Gastroenterology;  Laterality: N/A;   FINE NEEDLE ASPIRATION  09/02/2019   Procedure: FINE NEEDLE ASPIRATION (FNA) LINEAR;  Surgeon: Irving Copas., MD;  [facility];  Service: Gastroenterology;;   FINE NEEDLE ASPIRATION  12/09/2019   Procedure: FINE NEEDLE ASPIRATION (FNA) LINEAR;  Surgeon: Irving Copas., MD;  [facility];  Service: Gastroenterology;;   HEMOSTASIS CLIP PLACEMENT  12/09/2019   Procedure: HEMOSTASIS CLIP PLACEMENT;  Surgeon: Irving Copas., MD;  [facility];  Service: Gastroenterology;;   ORIF ANKLE FRACTURE Right 04/27/2015   Procedure: OPEN REDUCTION INTERNAL  FIXATION (ORIF) RIGHT BIMALLEOLAR ANKLE FRACTURE WITH SYNDESMOSIS FIXATION;  Surgeon: Leandrew Koyanagi, MD;  Location: [place];  Service: Orthopedics;  Laterality: Right;   SUBMUCOSAL LIFTING INJECTION  12/09/2019   Procedure: SUBMUCOSAL LIFTING INJECTION;  Surgeon: Irving Copas., MD;  [facility];  Service: Gastroenterology;;   TEE WITHOUT CARDIOVERSION N/A 08/01/2016   Procedure: TRANSESOPHAGEAL ECHOCARDIOGRAM (TEE);  Surgeon: Lelon Perla, MD;  [facility];  Service: Cardiovascular;  Laterality: N/A;   THYROIDECTOMY  11/24/2013   DR Dalbert Batman   THYROIDECTOMY N/A 11/24/2013   Procedure: TOTAL THYROIDECTOMY;  Surgeon: Adin Hector, MD;  Location: [place];  Service: General;  Laterality: N/A;   TRANSTHORACIC ECHOCARDIOGRAM  01/15/2013   EF 55% TO 65%. PROBABLE MILD HYPOKINESIS OF THE INFERIOR MYOCARDIUM. GRADE 1 DIASTOLIC DYSFUNCTION. TRIAL AR.LA IS MILDLY DILATED.     Current Outpatient Medications  Medication Sig Dispense Refill   acetaminophen (TYLENOL) 500 MG tablet Take 1,000 mg by mouth every 6 (six) hours as needed for moderate pain.     allopurinol (ZYLOPRIM) 100 MG tablet Take 1 tablet (100 mg total) by mouth in the morning. 90 tablet 0   amiodarone (PACERONE) 200 MG tablet Take 1 tablet (200 mg total) by mouth daily. 30 tablet 3   amLODipine (NORVASC) 5 MG tablet TAKE 1 & 1/2 (ONE & ONE-HALF) TABLETS BY MOUTH IN THE EVENING 135 tablet 1   apixaban (ELIQUIS) 5 MG TABS tablet Take 1 tablet by mouth twice daily 180 tablet 1   carvedilol (COREG) 6.25 MG tablet Take 1 tablet (6.25 mg total)  by mouth 2 (two) times daily with a meal. 60 tablet 3   FARXIGA 10 MG TABS tablet Take 10 mg by mouth in the morning.     furosemide (LASIX) 20 MG tablet Take 20 mg by mouth daily as needed for edema.     hydrochlorothiazide (MICROZIDE) 12.5 MG capsule Take 12.5 mg by mouth every morning.     isosorbide dinitrate (ISORDIL) 5 MG tablet Take 1 tablet by mouth twice daily 180  tablet 1   losartan (COZAAR) 25 MG tablet Take 25 mg by mouth at bedtime.     Magnesium 400 MG TABS Take 400 mg by mouth 2 (two) times a week.     Multiple Vitamins-Minerals (MULTIVITAMIN WITH MINERALS) tablet Take 1 tablet by mouth daily.     Probiotic Product (PROBIOTIC PO) Take 1 capsule by mouth 2 (two) times a week.     rosuvastatin (CRESTOR) 40 MG tablet Take 1 tablet by mouth once daily 90 tablet 0   SYNTHROID 100 MCG tablet Take 1 tablet (100 mcg total) by mouth daily before breakfast. 90 tablet 3   Vitamin D3 (VITAMIN D) 25 MCG tablet Take 1,000 Units by mouth 2 (two) times a week.     zinc gluconate 50 MG tablet Take 50 mg by mouth 2 (two) times a week.     Cantrell current facility-administered medications for this visit.    Allergies:   Labetalol and Codeine   Social History:  The patient  reports that she quit smoking about 31 years ago. Her smoking use included cigarettes. She has a 15.00 pack-year smoking history. She has never been exposed to tobacco smoke. She has never used smokeless tobacco. She reports current alcohol use. She reports that she does not use drugs.   Family History:  The patient's family history includes Bone cancer in her sister; Diabetes in her father; Heart disease in her brother and mother; Melanoma in her brother; Stroke in her father; Sudden death in her brother and mother.   ROS:  Please see the history of present illness.   Otherwise, review of systems is positive for none.   All other systems are reviewed and negative.   PHYSICAL EXAM: VS:  BP 132/72   Pulse (!) 45   Ht '5\' 4"'$  (1.626 m)   Wt 206 lb (93.4 kg)   SpO2 99%   BMI 35.36 kg/m  , BMI Body mass index is 35.36 kg/m. GEN: Well nourished, well developed, in Cantrell acute distress  HEENT: normal  Neck: Cantrell JVD, carotid bruits, or masses Cardiac: RRR; Cantrell murmurs, rubs, or gallops,Cantrell edema  Respiratory:  clear to auscultation bilaterally, normal work of breathing GI: soft, nontender, nondistended, +  BS MS: Cantrell deformity or atrophy  Skin: warm and dry Neuro:  Strength and sensation are intact Psych: euthymic mood, full affect  EKG:  EKG is not ordered today. Personal review of the ekg ordered 07/18/22 shows sinus rhythm, rate 45, inferior Q waves  Recent Labs: 03/24/2022: ALT 16; B Natriuretic Peptide 161.2; Magnesium 2.6 06/27/2022: TSH 8.64 07/10/2022: BUN 16; Creatinine, Ser 1.64; Hemoglobin 15.6; Platelets 170; Potassium 4.1; Sodium 142    Lipid Panel     Component Value Date/Time   CHOL 141 05/09/2022 1544   TRIG 116 05/09/2022 1544   HDL 42 05/09/2022 1544   CHOLHDL 3.4 05/09/2022 1544   CHOLHDL 3.4 09/22/2019 0226   VLDL 19 09/22/2019 0226   LDLCALC 78 05/09/2022 1544     Wt Readings from Last  3 Encounters:  08/03/22 206 lb (93.4 kg)  07/18/22 209 lb 14.1 oz (95.2 kg)  07/10/22 209 lb 12.8 oz (95.2 kg)      Other studies Reviewed: Additional studies/ records that were reviewed today include: TTE 04/24/21  Review of the above records today demonstrates:   1. Left ventricular ejection fraction, by estimation, is 55 to 60%. The  left ventricle has normal function. The left ventricle has Cantrell regional  wall motion abnormalities. There is mild left ventricular hypertrophy.  Left ventricular diastolic parameters  are indeterminate.   2. Right ventricular systolic function is normal. The right ventricular  size is normal.   3. Left atrial size was mildly dilated.   4. The mitral valve is normal in structure. Mild mitral valve  regurgitation. Cantrell evidence of mitral stenosis.   5. The aortic valve is normal in structure. Aortic valve regurgitation is  mild. Cantrell aortic stenosis is present.   6. The inferior vena cava is normal in size with greater than 50%  respiratory variability, suggesting right atrial pressure of 3 mmHg.    ASSESSMENT AND PLAN:  1.  Persistent atrial fibrillation: Status post ablation 07/28/2021.  Currently on Eliquis 5 mg twice daily.  CHA2DS2-VASc of  at least 5.  She has unfortunately had more frequent episodes of atrial fibrillation.  He has had more frequent episodes of atrial fibrillation and had a recent cardioversion.  Currently on amiodarone 200 mg daily.  High risk medication monitoring for amiodarone.  She would prefer ablation to get off of her amiodarone.  As she is mildly bradycardic, Claudia Cantrell stop carvedilol.  Risk, benefits, and alternatives to EP study and radiofrequency ablation for afib were also discussed in detail today. These risks include but are not limited to stroke, bleeding, vascular damage, tamponade, perforation, damage to the esophagus, lungs, and other structures, pulmonary vein stenosis, worsening renal function, and death. The patient understands these risk and wishes to proceed.  We Claudia Cantrell therefore proceed with catheter ablation at the next available time.  Carto, ICE, anesthesia are requested for the procedure.  Claudia Cantrell also obtain CT PV protocol prior to the procedure to exclude LAA thrombus and further evaluate atrial anatomy.   2.  Coronary artery disease: Cantrell current chest pain.  Plan per primary cardiology.  3.  Obstructive sleep apnea: CPAP compliance encouraged  4.  Secondary hypercoagulable state: Currently on Eliquis for atrial fibrillation as above  Current medicines are reviewed at length with the patient today.   The patient does not have concerns regarding her medicines.  The following changes were made today: Stop carvedilol  Labs/ tests ordered today include:  Cantrell orders of the defined types were placed in this encounter.      Disposition:   FU with Deshannon Hinchliffe 3 months  Signed, Claudia Gwynne Meredith Leeds, MD  08/03/2022 9:20 AM     [facility] [address] [place] [address] [phone] (office) [phone] (fax)

## 2022-08-03 NOTE — Patient Instructions (Addendum)
  Medication Instructions:  Your physician recommends that you continue on your current medications as directed. Please refer to the Current Medication list given to you today.  *If you need a refill on your cardiac medications before your next appointment, please call your pharmacy*   Lab Work: None ordered If you have labs (blood work) drawn today and your tests are completely normal, you will receive your results only by: [place] (if you have MyChart) OR A paper copy in the mail If you have any lab test that is abnormal or we need to change your treatment, we will call you to review the results.   Testing/Procedures: None ordered   Follow-Up: At [facility], you and your health needs are our priority.  As part of our continuing mission to provide you with exceptional heart care, we have created designated Provider Care Teams.  These Care Teams include your primary Cardiologist (physician) and Advanced Practice Providers (APPs -  Physician Assistants and Nurse Practitioners) who all work together to provide you with the care you need, when you need it.  We recommend signing up for the patient portal called "MyChart".  Sign up information is provided on this After Visit Summary.  MyChart is used to connect with patients for Virtual Visits (Telemedicine).  Patients are able to view lab/test results, encounter notes, upcoming appointments, etc.  Non-urgent messages can be sent to your provider as well.   To learn more about what you can do with MyChart, go to [email or web address].    Dr. Curt Bears recommends a repeat ablation.  We will schedule this 12/14/2021.  The office will call you at a later time to go over everything.   Thank you for choosing [facility]!!   Trinidad Curet, RN [phone]  Other Instructions   Important Information About Sugar

## 2022-08-16 ENCOUNTER — Ambulatory Visit: Payer: Medicare Other | Attending: Physician Assistant | Admitting: Physician Assistant

## 2022-08-16 ENCOUNTER — Encounter: Payer: Self-pay | Admitting: Physician Assistant

## 2022-08-16 VITALS — BP 145/86 | HR 60 | Resp 15 | Ht 64.0 in | Wt 205.4 lb

## 2022-08-16 DIAGNOSIS — M1A09X0 Idiopathic chronic gout, multiple sites, without tophus (tophi): Secondary | ICD-10-CM

## 2022-08-16 DIAGNOSIS — Z9989 Dependence on other enabling machines and devices: Secondary | ICD-10-CM

## 2022-08-16 DIAGNOSIS — Z79899 Other long term (current) drug therapy: Secondary | ICD-10-CM | POA: Diagnosis not present

## 2022-08-16 DIAGNOSIS — Z8679 Personal history of other diseases of the circulatory system: Secondary | ICD-10-CM

## 2022-08-16 DIAGNOSIS — Z7901 Long term (current) use of anticoagulants: Secondary | ICD-10-CM

## 2022-08-16 DIAGNOSIS — M65342 Trigger finger, left ring finger: Secondary | ICD-10-CM

## 2022-08-16 DIAGNOSIS — Z8509 Personal history of malignant neoplasm of other digestive organs: Secondary | ICD-10-CM

## 2022-08-16 DIAGNOSIS — E790 Hyperuricemia without signs of inflammatory arthritis and tophaceous disease: Secondary | ICD-10-CM | POA: Diagnosis not present

## 2022-08-16 DIAGNOSIS — G4733 Obstructive sleep apnea (adult) (pediatric): Secondary | ICD-10-CM

## 2022-08-16 DIAGNOSIS — Z8639 Personal history of other endocrine, nutritional and metabolic disease: Secondary | ICD-10-CM

## 2022-08-17 ENCOUNTER — Other Ambulatory Visit: Payer: Self-pay | Admitting: *Deleted

## 2022-08-17 ENCOUNTER — Telehealth: Payer: Self-pay

## 2022-08-17 DIAGNOSIS — Z79899 Other long term (current) drug therapy: Secondary | ICD-10-CM

## 2022-08-17 LAB — COMPLETE METABOLIC PANEL WITH GFR
AG Ratio: 1.7 (calc) (ref 1.0–2.5)
ALT: 17 U/L (ref 6–29)
AST: 19 U/L (ref 10–35)
Albumin: 4.8 g/dL (ref 3.6–5.1)
Alkaline phosphatase (APISO): 83 U/L (ref 37–153)
BUN/Creatinine Ratio: 11 (calc) (ref 6–22)
BUN: 18 mg/dL (ref 7–25)
CO2: 30 mmol/L (ref 20–32)
Calcium: 10.2 mg/dL (ref 8.6–10.4)
Chloride: 106 mmol/L (ref 98–110)
Creat: 1.58 mg/dL — ABNORMAL HIGH (ref 0.60–1.00)
Globulin: 2.9 g/dL (calc) (ref 1.9–3.7)
Glucose, Bld: 82 mg/dL (ref 65–99)
Potassium: 3.9 mmol/L (ref 3.5–5.3)
Sodium: 143 mmol/L (ref 135–146)
Total Bilirubin: 0.8 mg/dL (ref 0.2–1.2)
Total Protein: 7.7 g/dL (ref 6.1–8.1)
eGFR: 34 mL/min/{1.73_m2} — ABNORMAL LOW (ref >=60)

## 2022-08-17 LAB — CBC WITH DIFFERENTIAL/PLATELET
Absolute Monocytes: 399 cells/uL (ref 200–950)
Basophils Absolute: 11 cells/uL (ref 0–200)
Basophils Relative: 0.3 %
Eosinophils Absolute: 61 cells/uL (ref 15–500)
Eosinophils Relative: 1.6 %
HCT: 49.5 % — ABNORMAL HIGH (ref 35.0–45.0)
Hemoglobin: 16.4 g/dL — ABNORMAL HIGH (ref 11.7–15.5)
Lymphs Abs: 1307 cells/uL (ref 850–3900)
MCH: 31.7 pg (ref 27.0–33.0)
MCHC: 33.1 g/dL (ref 32.0–36.0)
MCV: 95.7 fL (ref 80.0–100.0)
MPV: 9 fL (ref 7.5–12.5)
Monocytes Relative: 10.5 %
Neutro Abs: 2022 cells/uL (ref 1500–7800)
Neutrophils Relative %: 53.2 %
Platelets: 196 10*3/uL (ref 140–400)
RBC: 5.17 10*6/uL — ABNORMAL HIGH (ref 3.80–5.10)
RDW: 14.3 % (ref 11.0–15.0)
Total Lymphocyte: 34.4 %
WBC: 3.8 10*3/uL (ref 3.8–10.8)

## 2022-08-17 LAB — URIC ACID: Uric Acid, Serum: 3.9 mg/dL (ref 2.5–7.0)

## 2022-08-17 MED ORDER — ALLOPURINOL 100 MG PO TABS: ORAL_TABLET | ORAL | 0 refills | Status: DC

## 2022-08-17 NOTE — Progress Notes (Signed)
Uric acid WNL.  Creatinine remains elevated-1.58 and GFR is low-34.   CBC stable.  Ok to restart allopurinol 50 mg daily x1 week then increase to 100 mg daily.  Recheck lab work in 72 month.

## 2022-08-17 NOTE — Telephone Encounter (Signed)
-----   Message from Ofilia Neas, PA-C sent at 08/17/2022 11:43 AM EDT ----- Uric acid WNL.  Creatinine remains elevated-1.58 and GFR is low-34.   CBC stable.  Ok to restart allopurinol 50 mg daily x1 week then increase to 100 mg daily.  Recheck lab work in 42 month.

## 2022-08-17 NOTE — Telephone Encounter (Signed)
-----   Message from Ofilia Neas, PA-C sent at 08/17/2022 11:43 AM EDT ----- Uric acid WNL.  Creatinine remains elevated-1.58 and GFR is low-34.   CBC stable.  Ok to restart allopurinol 50 mg daily x1 week then increase to 100 mg daily.  Recheck lab work in 64 month.

## 2022-08-23 IMAGING — CT CT ABD-PELV W/ CM
2 of 5 series · 16 of 46 positions shown, 18 images · IV contrast (OMNIPAQUE)
Comparison: February 16, 2021

CLINICAL DATA: History of GIST, follow-up.

EXAM:
CT ABDOMEN AND PELVIS WITH CONTRAST
TECHNIQUE: Multidetector CT imaging of the abdomen and pelvis was performed
using the standard protocol following bolus administration of
intravenous contrast.

[Series 2: axial st · axial · 0.86mm/px · z∈[+1208,+1598]mm · 13 of 92 slices shown, 15 images]
[im 7/92  soft-tissue]
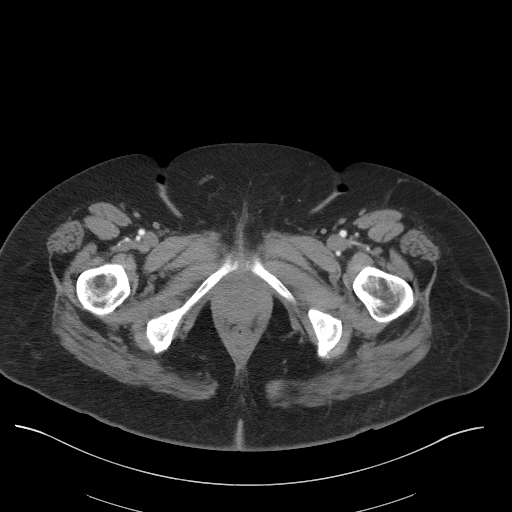
[im 7/92  bone]
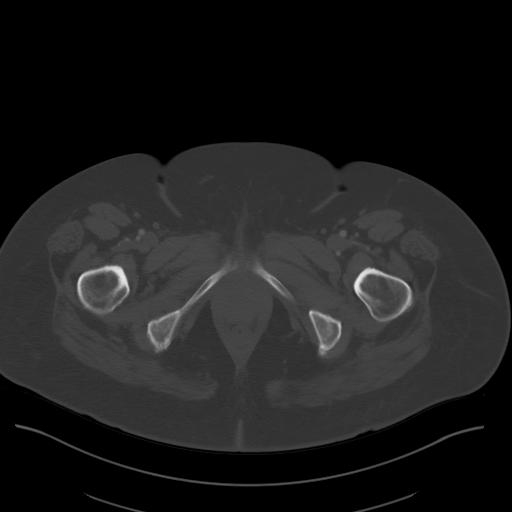
[im 14/92  soft-tissue]
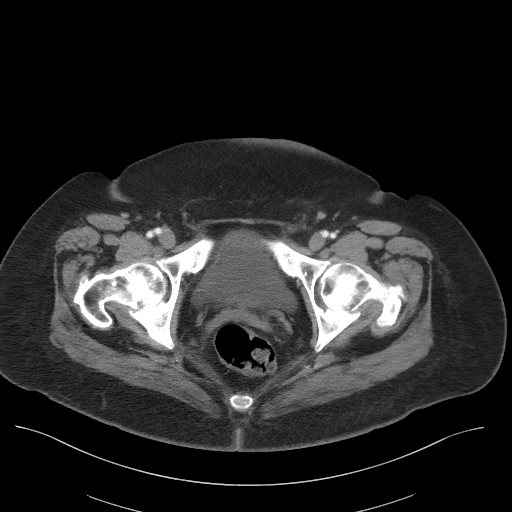
[im 20/92  soft-tissue]
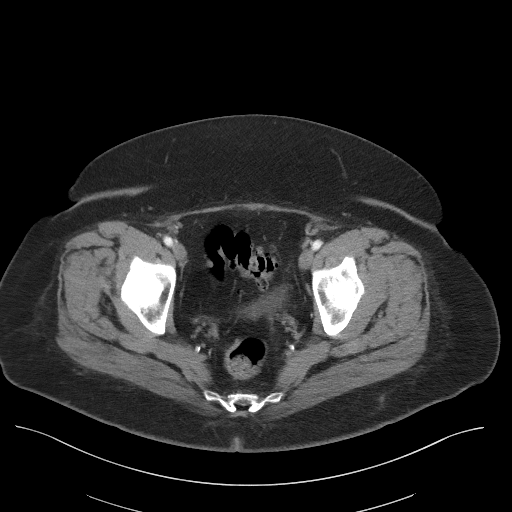
[im 27/92  soft-tissue]
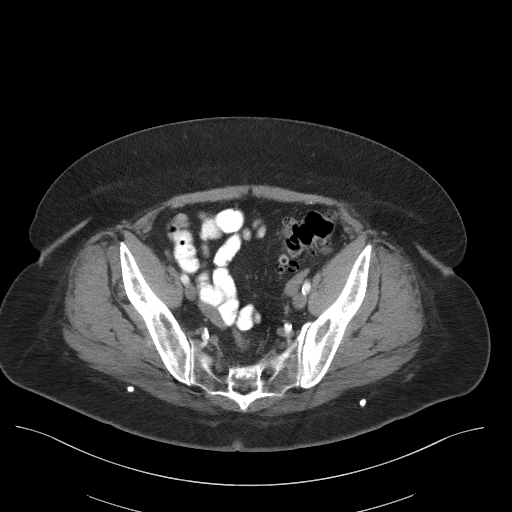
[im 33/92  soft-tissue]
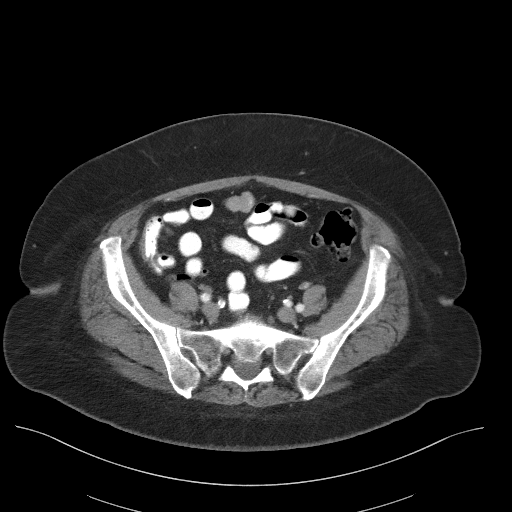
[im 40/92  soft-tissue]
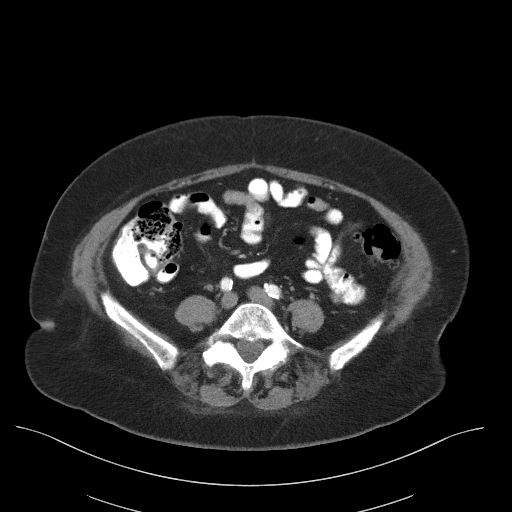
[im 46/92  soft-tissue]
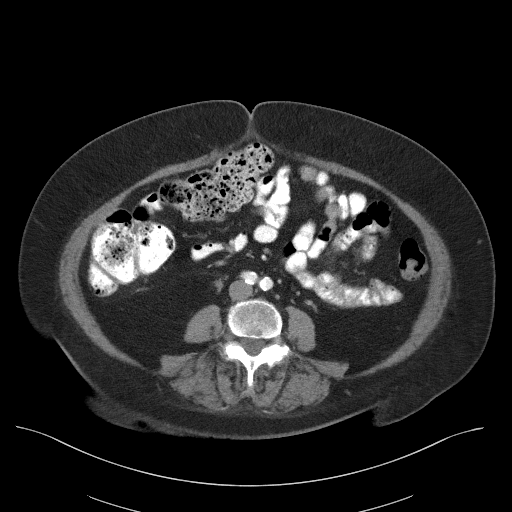
[im 53/92  soft-tissue]
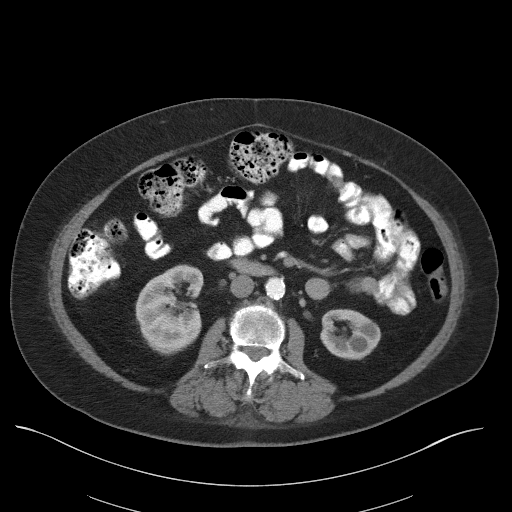
[im 59/92  soft-tissue]
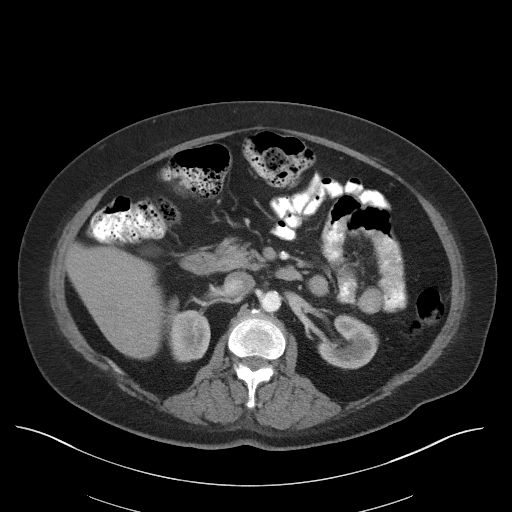
[im 59/92  bone]
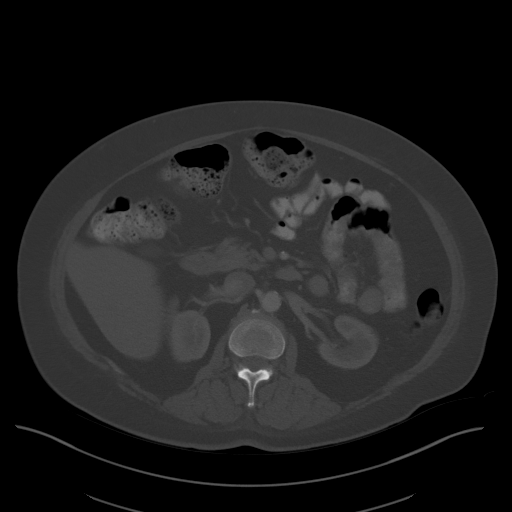
[im 66/92  soft-tissue]
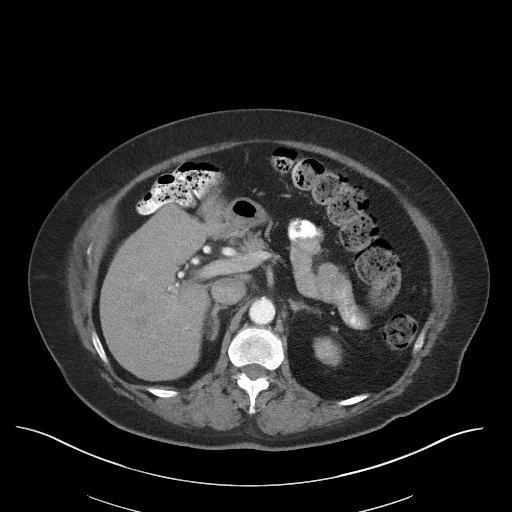
[im 72/92  soft-tissue]
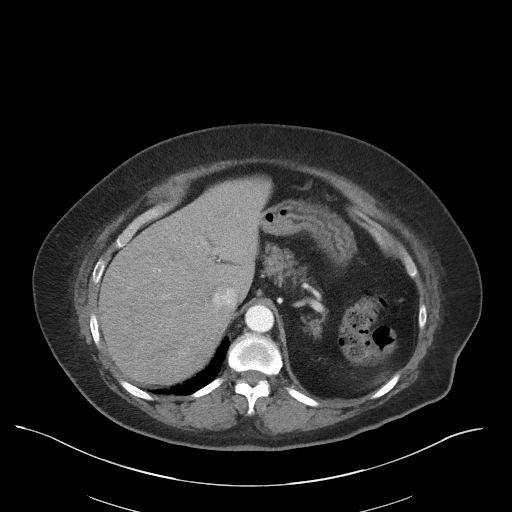
[im 79/92  soft-tissue]
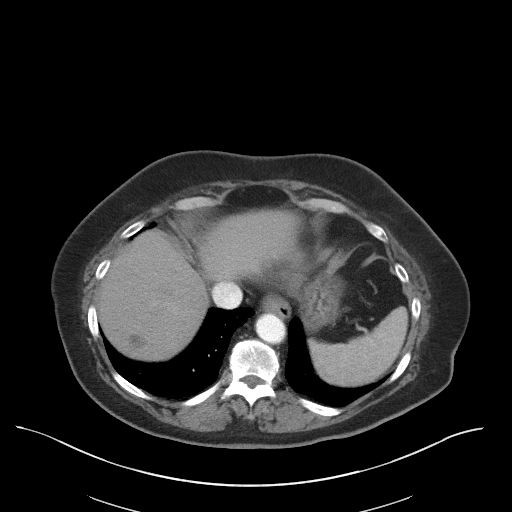
[im 85/92  soft-tissue]
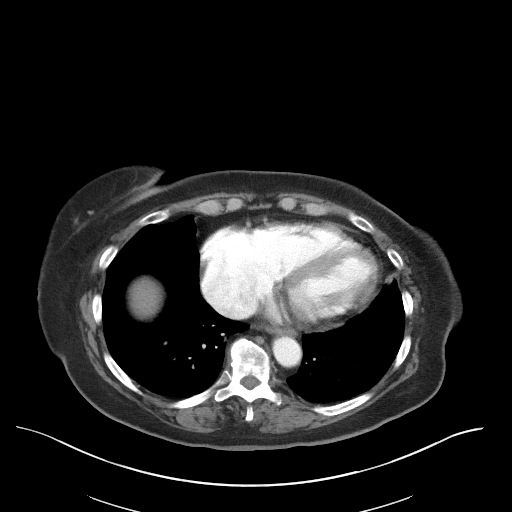

[Series 5: coronal st · coronal · 0.90mm/px · 3 of 87 slices shown]
[im 29/87  soft-tissue]
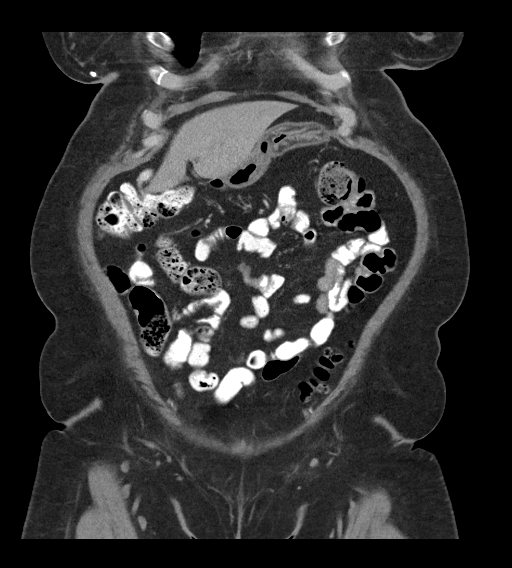
[im 39/87  soft-tissue]
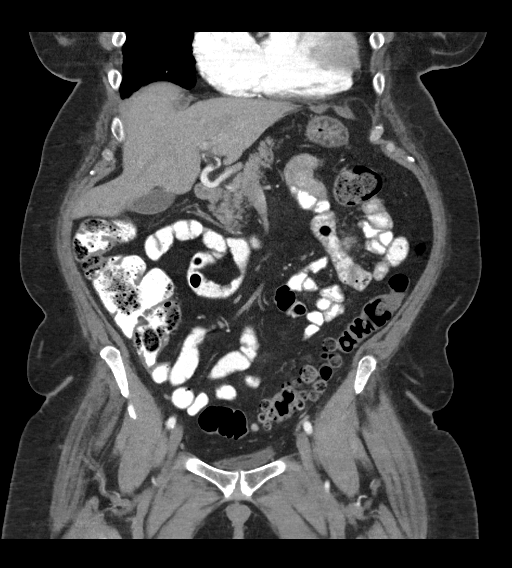
[im 48/87  soft-tissue]
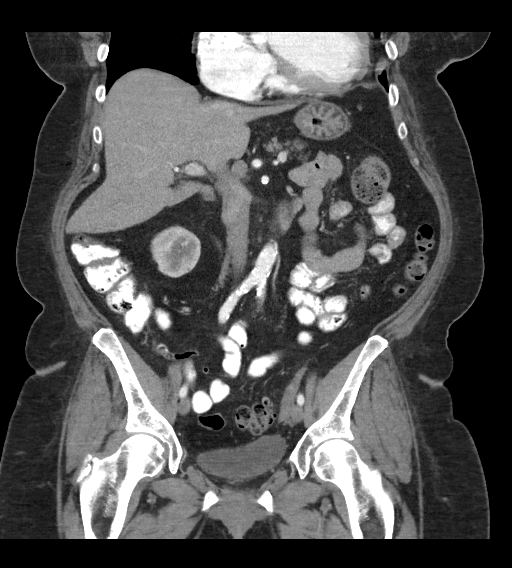

[16 of 46 positions shown; findings below may reference images not displayed]

RADIATION DOSE REDUCTION: This exam was performed according to the
departmental dose-optimization program which includes automated
exposure control, adjustment of the mA and/or kV according to
patient size and/or use of iterative reconstruction technique.

CONTRAST:  100mL OMNIPAQUE IOHEXOL 300 MG/ML  SOLN
FINDINGS: Lower chest: No acute abnormality.

Hepatobiliary: Similar mild hepatomegaly and hepatic steatosis.
Stable 2.2 cm cyst in the right lobe of the liver. Subcentimeter
hypodense lesion in the left lobe of the liver on image [DATE] and
inferior right lobe of the liver on image 36/2 are also stable and
likely reflects cysts. Gallbladder is unremarkable. No biliary
ductal dilation.

Pancreas: No pancreatic ductal dilation or evidence of acute
inflammation.

Spleen: No splenomegaly or focal splenic lesion.

Adrenals/Urinary Tract: Bilateral adrenal glands appear normal. No
hydronephrosis. Left renal cysts are stable and measure up to
cm. Additional scattered tiny subcentimeter hypodense renal lesions
are technically too small to accurately characterize. Urinary
bladder is unremarkable for degree of distension.

Stomach/Bowel: Radiopaque enteric contrast material traverses the
hepatic flexure. Stomach is nondistended limiting evaluation. Stable
partially calcified 13 mm exophytic nodule along the distal ventral
stomach on image [DATE]. No pathologic dilation of small or large
bowel. The appendix and terminal ileum appear normal. Moderate
volume of formed stool throughout the colon. Sigmoid colonic
diverticulosis without findings of acute diverticulitis.

Vascular/Lymphatic: Aortic and branch vessel atherosclerosis without
abdominal aortic aneurysm. No pathologically enlarged abdominal or
pelvic lymph nodes.

Reproductive: No acute or suspicious finding.

Other: No significant abdominopelvic free fluid. Sequela of
subcutaneous injections in the posterior gluteal soft tissues.

Musculoskeletal: Multilevel degenerative changes spine. No
aggressive lytic or blastic lesion of bone.
IMPRESSION: 1. Stable partially calcified 13 mm exophytic nodule along the
distal ventral stomach.
2. Similar mild hepatomegaly with hepatic steatosis.
3. Sigmoid colonic diverticulosis without findings of acute
diverticulitis.
4. Moderate volume of formed stool throughout the colon.
5.  Aortic Atherosclerosis (L0JC5-XTA.A).

## 2022-09-14 ENCOUNTER — Encounter: Payer: Medicare Other | Attending: Internal Medicine | Admitting: Dietician

## 2022-09-14 ENCOUNTER — Encounter: Payer: Self-pay | Admitting: Dietician

## 2022-09-14 ENCOUNTER — Other Ambulatory Visit: Payer: Self-pay | Admitting: Physician Assistant

## 2022-09-14 DIAGNOSIS — N189 Chronic kidney disease, unspecified: Secondary | ICD-10-CM | POA: Insufficient documentation

## 2022-09-14 DIAGNOSIS — M1A09X0 Idiopathic chronic gout, multiple sites, without tophus (tophi): Secondary | ICD-10-CM

## 2022-09-14 DIAGNOSIS — Z713 Dietary counseling and surveillance: Secondary | ICD-10-CM | POA: Diagnosis not present

## 2022-09-14 DIAGNOSIS — R7303 Prediabetes: Secondary | ICD-10-CM

## 2022-09-14 DIAGNOSIS — N1832 Chronic kidney disease, stage 3b: Secondary | ICD-10-CM

## 2022-09-14 DIAGNOSIS — E790 Hyperuricemia without signs of inflammatory arthritis and tophaceous disease: Secondary | ICD-10-CM

## 2022-09-14 NOTE — Patient Instructions (Signed)
Avoid any potassium containing salt substitutes. Use small portions of fresh rather than process meats. Move more.  Start slow and gradually increase.  Do you have Silver Sneakers?    Mindfulness:  Consistently scheduled meal - avoid skipping  Choices  Eat slowly  Away from distraction (sitting in kitchen or dining room)  Stop eating when satisfied  Before a snack ask, "Am I hungry or eating for another reason?"   "What can I do instead if I am not hungry?"  Try to find something every day that brings you joy!\   Eat more Non-Starchy Vegetables These include greens, broccoli, cauliflower, cabbage, carrots, beets, eggplant, peppers, squash and others. Minimize added sugars and refined grains Rethink what you drink.  Choose beverages without added sugar.  Look for 0 carbs on the label. See the list of whole grains below.  Find alternatives to usual sweet treats. Choose whole foods over processed. Make simple meals at home more often than eating out.  Tips to increase fiber in your diet: (All plants have fiber.  Eat a variety. There are more than are on this list.) Slowly increase the amount of fiber you eat to 25-35 grams per day.  (More is fine if you tolerate it.) Fiber from whole grains, nuts and seeds Quinoa, 1/2 cup = 5 grams Bulgur, 1/2 cup = 4.1 grams Popcorn, 3 cups = 3.6 grams Whole Wheat Spaghetti, 1/2 cup = 3.2 grams Barley, 1/2 cup = 3 grams Oatmeal, 1/2 cup = 2 grams Whole Wheat English Muffin = 3 grams Corn, 1/2 cup = 2.1 grams Brown Rice, 1/2 cup = 1.8 grams Flax seeds, 1 Tablespoon = 2.8 grams Chia seeds, 1 Tablespoon = 11 grams Almonds, 1 ounce = 3.5 grams fiber Fiber from legumes Kidney beans, 1/2 cup 7.9 grams Lentils, 1/2 cup = 7.8 grams Pinto beans, 1/2 cup = 7.7 grams Black beans, 1/2 cup = 7.6 grams Lima beans, 1/2 cup 6.4 grams Chick peas, 1/2 cup = 5.3 grams Black eyed peas, 1/2 cup = 4 grams Fiber from fruits and vegetables Pear, 6  grams Apple. 3.3 grams Raspberries or Blackberries, 3/4 cup = 6 grams Strawberries or Blueberries, 1 cup = 3.4 grams Baked sweet potato 3.8 grams fiber Baked potato with skin 4.4 grams  Peas, 1/2 cup = 4.4 grams  Spinach, 1/2 cup cooked = 3.5 grams  Avocado, 1/2 = 5 grams

## 2022-09-14 NOTE — Progress Notes (Signed)
Diabetes Self-Management Education  Visit Type: First/Initial  Appt. Start Time: 1015 Appt. End Time: 1130  09/14/2022  Claudia Cantrell, identified by name and date of birth, is a 76 y.o. female with a diagnosis of Diabetes: Pre-Diabetes.   ASSESSMENT Patient is here today alone.   Discussed the Diabetes Prevention Program and patient would prefer to continue a one to one visit with myself.  She states that she feels better when she does not eat the snacks. She states that she feels sluggish when she wakes in the am after eating the wrong foods.   Her biggest concern is newly diagnosed prediabetes. She seeds a nephrologist every 3-4 months.  History includes:  Prediabetes, CKD, afib, cervical cancer hx, HLD, HTN, GERD, hypothyroidism, OSA on c-pap Labs noted to include: A1C 6.1% 05/09/2022 and has been in the prediabetes since since 2019 or longer.  BUN 18, Creatinine 1.58, GFR 34, Potassium 3.9 08/16/2022, vitamin D 55 05/09/2022, proteinuria at times Medications include:  Farxiga, vitamin D, probiotic, lasix, allopurinol, MVI, synthroid Sleep 8 hours per night  Weight hx: 65" 210 lbs 09/14/2022 gained with increased snacking in the afternoon (chips and dip) UBW 202-206 lbs  Patient lives alone.  She is on disability and is retired. Has been unable to walk due to her heart condition.  She states she used to be more active when she was young (gardening, roller skating, biking).  Was less active as the children left home and began eating more "junk". 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  Dislikes beef, likes vegetables.  Stopped soda. Eats increased amounts of processed/high sodium meat.   Height '5\' 5"'$  (1.651 m), weight 210 lb (95.3 kg). Body mass index is 34.95 kg/m.   Diabetes Self-Management Education - 09/14/22 1013       Visit Information   Visit Type First/Initial      Initial Visit   Diabetes Type Pre-Diabetes    Date Diagnosed 2019    Are you currently following a meal plan? No     Are you taking your medications as prescribed? Yes      Health Coping   How would you rate your overall health? Fair      Psychosocial Assessment   Patient Belief/Attitude about Diabetes Motivated to manage diabetes    What is the hardest part about your diabetes right now, causing you the most concern, or is the most worrisome to you about your diabetes?   Making healty food and beverage choices    Self-care barriers None    Self-management support Doctor's office    Other persons present Patient    Patient Concerns Nutrition/Meal planning;Weight Control    Special Needs None    Preferred Learning Style No preference indicated    Learning Readiness Ready    How often do you need to have someone help you when you read instructions, pamphlets, or other written materials from your doctor or pharmacy? 1 - Never    What is the last grade level you completed in school? 2 years college      Pre-Education Assessment   Patient understands the diabetes disease and treatment process. Needs Instruction    Patient understands incorporating nutritional management into lifestyle. Needs Instruction    Patient undertands incorporating physical activity into lifestyle. Needs Instruction    Patient understands using medications safely. Needs Instruction    Patient understands monitoring blood glucose, interpreting and using results Needs Instruction    Patient understands prevention, detection, and treatment of acute complications.  Needs Instruction    Patient understands prevention, detection, and treatment of chronic complications. Needs Instruction    Patient understands how to develop strategies to address psychosocial issues. Needs Instruction    Patient understands how to develop strategies to promote health/change behavior. Needs Instruction      Complications   Last HgB A1C per patient/outside source 6.1 %   05/09/2022   How often do you check your blood sugar? 0 times/day (not testing)       Dietary Intake   Breakfast flavored yogurt with granola, fruit OR egg, cheese, spinach, toast OR occasional sausage sandwich on croisant    Snack (morning) grapes    Lunch rotissorie chicken, instant rice, cabbage, apple, occasional cookie OR 2 hot dogs plain, fries   1-2   Snack (afternoon) tries to avoid or raw vegetables    Dinner salad, sliced egg, grapes, apple, rotissorie chicken with [place] dressing, 4-5 crackers   5-7   Snack (evening) none    Beverage(s) Water, coffee with sweetened cream and splenda, selzer water, lemonade      Activity / Exercise   Activity / Exercise Type ADL's   since heart issues this year     Patient Education   Previous Diabetes Education No    Disease Pathophysiology Other (comment)   prediabetes and physiology of insulin resistance   Healthy Eating Other (comment);Role of diet in the treatment of diabetes and the relationship between the three main macronutrients and blood glucose level;Food label reading, portion sizes and measuring food.;Plate Method   low sodium   Being Active Role of exercise on diabetes management, blood pressure control and cardiac health.;Helped patient identify appropriate exercises in relation to his/her diabetes, diabetes complications and other health issue.    Medications Reviewed patients medication for diabetes, action, purpose, timing of dose and side effects.      Individualized Goals (developed by patient)   Nutrition General guidelines for healthy choices and portions discussed    Physical Activity Exercise 3-5 times per week;15 minutes per day    Medications take my medication as prescribed    Monitoring  Not Applicable    Problem Solving Addressing barriers to behavior change      Post-Education Assessment   Patient understands the diabetes disease and treatment process. Demonstrates understanding / competency    Patient understands incorporating nutritional management into lifestyle. Comprehends key points     Patient undertands incorporating physical activity into lifestyle. Comprehends key points    Patient understands using medications safely. Demonstrates understanding / competency    Patient understands monitoring blood glucose, interpreting and using results N/A    Patient understands prevention, detection, and treatment of acute complications. N/A    Patient understands prevention, detection, and treatment of chronic complications. Demonstrates understanding / competency    Patient understands how to develop strategies to address psychosocial issues. Comprehends key points    Patient understands how to develop strategies to promote health/change behavior. Comprehends key points      Outcomes   Expected Outcomes Demonstrated interest in learning. Expect positive outcomes    Future DMSE 2 months    Program Status Not Completed             Individualized Plan for Diabetes Self-Management Training:   Learning Objective:  Patient will have a greater understanding of diabetes self-management. Patient education plan is to attend individual and/or group sessions per assessed needs and concerns.   Plan:   Patient Instructions  Avoid any potassium containing salt  substitutes. Use small portions of fresh rather than process meats. Move more.  Start slow and gradually increase.  Do you have Silver Sneakers?    Mindfulness:  Consistently scheduled meal - avoid skipping  Choices  Eat slowly  Away from distraction (sitting in kitchen or dining room)  Stop eating when satisfied  Before a snack ask, "Am I hungry or eating for another reason?"   "What can I do instead if I am not hungry?"  Try to find something every day that brings you joy!\   Eat more Non-Starchy Vegetables These include greens, broccoli, cauliflower, cabbage, carrots, beets, eggplant, peppers, squash and others. Minimize added sugars and refined grains Rethink what you drink.  Choose beverages without added sugar.   Look for 0 carbs on the label. See the list of whole grains below.  Find alternatives to usual sweet treats. Choose whole foods over processed. Make simple meals at home more often than eating out.  Tips to increase fiber in your diet: (All plants have fiber.  Eat a variety. There are more than are on this list.) Slowly increase the amount of fiber you eat to 25-35 grams per day.  (More is fine if you tolerate it.) Fiber from whole grains, nuts and seeds Quinoa, 1/2 cup = 5 grams Bulgur, 1/2 cup = 4.1 grams Popcorn, 3 cups = 3.6 grams Whole Wheat Spaghetti, 1/2 cup = 3.2 grams Barley, 1/2 cup = 3 grams Oatmeal, 1/2 cup = 2 grams Whole Wheat English Muffin = 3 grams Corn, 1/2 cup = 2.1 grams Brown Rice, 1/2 cup = 1.8 grams Flax seeds, 1 Tablespoon = 2.8 grams Chia seeds, 1 Tablespoon = 11 grams Almonds, 1 ounce = 3.5 grams fiber Fiber from legumes Kidney beans, 1/2 cup 7.9 grams Lentils, 1/2 cup = 7.8 grams Pinto beans, 1/2 cup = 7.7 grams Black beans, 1/2 cup = 7.6 grams Lima beans, 1/2 cup 6.4 grams Chick peas, 1/2 cup = 5.3 grams Black eyed peas, 1/2 cup = 4 grams Fiber from fruits and vegetables Pear, 6 grams Apple. 3.3 grams Raspberries or Blackberries, 3/4 cup = 6 grams Strawberries or Blueberries, 1 cup = 3.4 grams Baked sweet potato 3.8 grams fiber Baked potato with skin 4.4 grams  Peas, 1/2 cup = 4.4 grams  Spinach, 1/2 cup cooked = 3.5 grams  Avocado, 1/2 = 5 grams      Expected Outcomes:  Demonstrated interest in learning. Expect positive outcomes  Education material provided: Food label handouts, Meal plan card, My Plate, Snack sheet, and Diabetes Resources  If problems or questions, patient to contact team via:  Phone  Future DSME appointment: 2 months

## 2022-09-17 ENCOUNTER — Other Ambulatory Visit: Payer: Self-pay

## 2022-09-17 DIAGNOSIS — E790 Hyperuricemia without signs of inflammatory arthritis and tophaceous disease: Secondary | ICD-10-CM

## 2022-09-17 DIAGNOSIS — Z79899 Other long term (current) drug therapy: Secondary | ICD-10-CM

## 2022-09-17 DIAGNOSIS — M1A09X0 Idiopathic chronic gout, multiple sites, without tophus (tophi): Secondary | ICD-10-CM

## 2022-09-17 NOTE — Telephone Encounter (Signed)
Next Visit: 11/28/2022  Last Visit: 08/16/2022  Last Fill: 08/17/2022  DX: Idiopathic chronic gout of multiple sites without tophus   Current Dose per office lab note: Ok to restart allopurinol 50 mg daily x1 week then increase to 100 mg daily.  Labs: 08/16/2022 Uric acid WNL.  Creatinine remains elevated-1.58 and GFR is low-34.   CBC stable.   Patient will come to update labs this afternoon. Patient states she has increased her Allopurinol to 1 tablet daily.   Okay to refill Allopurinol?

## 2022-09-18 LAB — COMPLETE METABOLIC PANEL WITH GFR
AG Ratio: 1.5 (calc) (ref 1.0–2.5)
ALT: 15 U/L (ref 6–29)
AST: 17 U/L (ref 10–35)
Albumin: 4.7 g/dL (ref 3.6–5.1)
Alkaline phosphatase (APISO): 84 U/L (ref 37–153)
BUN/Creatinine Ratio: 11 (calc) (ref 6–22)
BUN: 18 mg/dL (ref 7–25)
CO2: 28 mmol/L (ref 20–32)
Calcium: 9.9 mg/dL (ref 8.6–10.4)
Chloride: 106 mmol/L (ref 98–110)
Creat: 1.58 mg/dL — ABNORMAL HIGH (ref 0.60–1.00)
Globulin: 3.1 g/dL (calc) (ref 1.9–3.7)
Glucose, Bld: 93 mg/dL (ref 65–99)
Potassium: 4.2 mmol/L (ref 3.5–5.3)
Sodium: 144 mmol/L (ref 135–146)
Total Bilirubin: 0.6 mg/dL (ref 0.2–1.2)
Total Protein: 7.8 g/dL (ref 6.1–8.1)
eGFR: 34 mL/min/{1.73_m2} — ABNORMAL LOW (ref >=60)

## 2022-09-18 LAB — CBC WITH DIFFERENTIAL/PLATELET
Absolute Monocytes: 288 cells/uL (ref 200–950)
Basophils Absolute: 20 cells/uL (ref 0–200)
Basophils Relative: 0.5 %
Eosinophils Absolute: 48 cells/uL (ref 15–500)
Eosinophils Relative: 1.2 %
HCT: 46.6 % — ABNORMAL HIGH (ref 35.0–45.0)
Hemoglobin: 15.7 g/dL — ABNORMAL HIGH (ref 11.7–15.5)
Lymphs Abs: 1192 cells/uL (ref 850–3900)
MCH: 31.4 pg (ref 27.0–33.0)
MCHC: 33.7 g/dL (ref 32.0–36.0)
MCV: 93.2 fL (ref 80.0–100.0)
MPV: 9.1 fL (ref 7.5–12.5)
Monocytes Relative: 7.2 %
Neutro Abs: 2452 cells/uL (ref 1500–7800)
Neutrophils Relative %: 61.3 %
Platelets: 210 10*3/uL (ref 140–400)
RBC: 5 10*6/uL (ref 3.80–5.10)
RDW: 14.9 % (ref 11.0–15.0)
Total Lymphocyte: 29.8 %
WBC: 4 10*3/uL (ref 3.8–10.8)

## 2022-09-18 LAB — URIC ACID: Uric Acid, Serum: 3.3 mg/dL (ref 2.5–7.0)

## 2022-09-18 NOTE — Progress Notes (Signed)
Creatinine remains elevated but stable. GFR is low at 34.  Rest of CMP WNL. CBC stable.  Uric acid is WNL.   Continue on current dose of allopurinol.

## 2022-10-15 ENCOUNTER — Ambulatory Visit: Payer: Medicare Other | Admitting: Internal Medicine

## 2022-10-15 NOTE — Progress Notes (Deleted)
Name: LISANNE PONCE  MRN/ DOB: [id number], Feb 21, 1946    Age/ Sex: 76 y.o., female    PCP: Berneta Levins, DO   Reason for Endocrinology Evaluation: Hypothyroidism     Date of Initial Endocrinology Evaluation: 10/15/2022     HPI: Ms. JUWANA THORESON is a 76 y.o. female with a past medical history of postoperative hypothyroidism, HTN, dyslipidemia and A.Fib . The patient presented for initial endocrinology clinic visit on 10/15/2022 for consultative assistance with her Hypothyroidism.   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 Benko is here for a follow up on postoperative hypothyroidism and Pre-diabetes.    She was seen by our CDE on 09/14/2022 She was seen by rheumatology in September 2023 for gout She had a follow-up with cardiology on 08/03/2022, continues to be on amiodarone which was started in June 2023 Weight stable  Continue with A. Fib and palpitations   Synthroid 100 mcg daily   HISTORY:  Past Medical History:  Past Medical History:  Diagnosis Date   Arthritis    rheumatoid   CAD (coronary artery disease)    a. 1992 s/p MI and PTCA of unknown vessel;  b. 09/2010 Cath: LM nl, LAD 20p, LCX 38m RCA 166mRPL 20.   Cervical cancer ([facility]   Chronic kidney disease    Family history of anesthesia complication    daughter has difficulty waking    GERD (gastroesophageal reflux disease)    occ   Gout 10/08/2016   Hyperlipidemia    Hypertensive heart disease    Hypothyroidism    Nonischemic cardiomyopathy ([facility]   a. 07/2016 Echo: EF 40-45%, mild LVH, inferior akinesis, moderately dilated left atrium, trivial AI and MR.   PAF ([place]ox[place]mal [place] fibrillation) [place]   a. 07/2016 Admitted w/ AF  RVR-->CHA2DS2VASc = 5-->Eliquis;  b. 07/2016 successful TEE/DCCV.   Sleep apnea    a. Using CPAP.   Past Surgical History:  Past Surgical History:  Procedure Laterality Date   ABDOMINAL HYSTERECTOMY  1988   ATRIAL FIBRILLATION ABLATION N/A 07/28/2021   Procedure: ATRIAL FIBRILLATION ABLATION;  Surgeon: CaConstance HawMD;  [facility];  Service: Cardiovascular;  Laterality: N/A;   [place] BIOPSY  09/02/2019   Procedure: BIOPSY;  Surgeon: MaRush LandmarkaTelford Nab MD;  [facility];  Service: Gastroenterology;;   BIOPSY  12/09/2019   Procedure: BIOPSY;  Surgeon: MaIrving Copas MD;  [facility];  Service: Gastroenterology;;   BIOPSY  03/08/2021   Procedure: BIOPSY;  Surgeon: MaIrving Copas MD;  [facility];  Service: Gastroenterology;;   CAGates PTCA BY DR CHMalta/A 08/01/2016   Procedure: CARDIOVERSION;  Surgeon: BrLelon PerlaMD;  [facility];  Service: Cardiovascular;  Laterality: N/A;   CARDIOVERSION N/A 01/11/2020   Procedure: CARDIOVERSION;  Surgeon: NiJosue HectorMD;  [facility];  Service: Cardiovascular;  Laterality: N/A;   CARDIOVERSION N/A 07/08/2020   Procedure: CARDIOVERSION;  Surgeon: ScDonato HeinzMD;  [facility];  Service: Cardiovascular;  Laterality: N/A;   CARDIOVERSION N/A 05/31/2022   Procedure: CARDIOVERSION;  Surgeon:  [name], MD;  Location[facility] END[facility]COPY;  Service: Cardiovascular;  Laterality: N/A;   CARDIOVERSION N/A 07/18/2022   Procedure: CARDIOVERSION;  Surgeon: [name], MD;  Location: [place];  Service: Cardiovascular;  Laterality: N/A;   ENDOSCOPIC MUCOSAL RESECTION N/A 12/09/2019   Procedure: ENDOSCOPIC MUCOSAL RESECTION;  Surgeon: Rush Landmark Telford Nab., MD;  [facility];  Service: Gastroenterology;  Laterality: N/A;    ESOPHAGOGASTRODUODENOSCOPY N/A 03/08/2021   Procedure: ESOPHAGOGASTRODUODENOSCOPY (EGD);  Surgeon: Irving Copas., MD;  [facility];  Service: Gastroenterology;  Laterality: N/A;   ESOPHAGOGASTRODUODENOSCOPY (EGD) WITH PROPOFOL N/A 09/02/2019   Procedure: ESOPHAGOGASTRODUODENOSCOPY (EGD) WITH PROPOFOL;  Surgeon: Rush Landmark Telford Nab., MD;  [facility]: [facility];  Service: Gastroenterology;  Laterality: N/A;   ESOPHAGOGASTRODUODENOSCOPY (EGD) WITH PROPOFOL N/A 12/09/2019   Procedure: ESOPHAGOGASTRODUODENOSCOPY (EGD) WITH PROPOFOL;  Surgeon: Rush Landmark Telford Nab., MD;  [facility];  Service: Gastroenterology;  Laterality: N/A;   EUS N/A 09/02/2019   Procedure: UPPER ENDOSCOPIC ULTRASOUND (EUS) RADIAL;  Surgeon: Irving Copas., MD;  [facility];  Service: Gastroenterology;  Laterality: N/A;  EUS radial/linear   EUS N/A 12/09/2019   Procedure: UPPER ENDOSCOPIC ULTRASOUND (EUS) RADIAL;  Surgeon: Irving Copas., MD;  [facility];  Service: Gastroenterology;  Laterality: N/A;   EUS  12/09/2019   Procedure: UPPER ENDOSCOPIC ULTRASOUND (EUS) LINEAR;  Surgeon: Irving Copas., MD;  [facility];  Service: Gastroenterology;;   EUS N/A 03/08/2021   Procedure: UPPER ENDOSCOPIC ULTRASOUND (EUS) RADIAL;  Surgeon: Irving Copas., MD;  [facility];  Service: Gastroenterology;  Laterality: N/A;   FINE NEEDLE ASPIRATION  09/02/2019   Procedure: FINE NEEDLE ASPIRATION (FNA) LINEAR;  Surgeon: Irving Copas., MD;  [facility];  Service: Gastroenterology;;   FINE NEEDLE ASPIRATION  12/09/2019   Procedure: FINE NEEDLE ASPIRATION (FNA) LINEAR;  Surgeon: Irving Copas., MD;  [facility];  Service: Gastroenterology;;   HEMOSTASIS CLIP PLACEMENT  12/09/2019   Procedure: HEMOSTASIS CLIP PLACEMENT;  Surgeon: Irving Copas., MD;  [facility];  Service:  Gastroenterology;;   ORIF ANKLE FRACTURE Right 04/27/2015   Procedure: OPEN REDUCTION INTERNAL FIXATION (ORIF) RIGHT BIMALLEOLAR ANKLE FRACTURE WITH SYNDESMOSIS FIXATION;  Surgeon: Leandrew Koyanagi, MD;  Location: [place];  Service: Orthopedics;  Laterality: Right;   SUBMUCOSAL LIFTING INJECTION  12/09/2019   Procedure: SUBMUCOSAL LIFTING INJECTION;  Surgeon: Irving Copas., MD;  [facility];  Service: Gastroenterology;;   TEE WITHOUT CARDIOVERSION N/A 08/01/2016   Procedure: TRANSESOPHAGEAL ECHOCARDIOGRAM (TEE);  Surgeon: Lelon Perla, MD;  [facility];  Service: Cardiovascular;  Laterality: N/A;   THYROIDECTOMY  11/24/2013   DR Dalbert Batman   THYROIDECTOMY N/A 11/24/2013   Procedure: TOTAL THYROIDECTOMY;  Surgeon: Adin Hector, MD;  Location: [place];  Service: General;  Laterality: N/A;   TRANSTHORACIC ECHOCARDIOGRAM  01/15/2013   EF 55% TO 65%. PROBABLE MILD HYPOKINESIS OF THE INFERIOR MYOCARDIUM. GRADE 1 DIASTOLIC DYSFUNCTION. TRIAL AR.LA IS MILDLY DILATED.    Social History:  reports that she quit smoking about 31 years ago. Her smoking use included cigarettes. She has a 15.00 pack-year smoking history. She has never been exposed to tobacco smoke. She has never used smokeless tobacco. She reports current alcohol use. She reports that she does not use drugs. Family History: family history includes Bone cancer in her sister; Diabetes in her father; Heart disease in her brother and mother; Melanoma in her brother; Stroke in her father; Sudden death in her brother  and mother.   HOME MEDICATIONS: Allergies as of 10/15/2022       Reactions   Labetalol    headaches   Codeine Anxiety        Medication List        Accurate as of October 15, 2022 11:14 AM. If you have any questions, ask your nurse or doctor.          acetaminophen 500 MG tablet Commonly known as: TYLENOL Take 1,000 mg by mouth every 6 (six) hours as needed for moderate pain.   allopurinol  100 MG tablet Commonly known as: ZYLOPRIM Take 1 tablet (100 mg total) by mouth daily.   amiodarone 200 MG tablet Commonly known as: Pacerone Take 1 tablet (200 mg total) by mouth daily.   amLODipine 5 MG tablet Commonly known as: NORVASC TAKE 1 & 1/2 (ONE & ONE-HALF) TABLETS BY MOUTH IN THE EVENING   carvedilol 6.25 MG tablet Commonly known as: COREG Take 1 tablet (6.25 mg total) by mouth 2 (two) times daily with a meal.   Eliquis 5 MG Tabs tablet Generic drug: apixaban Take 1 tablet by mouth twice daily   Farxiga 10 MG Tabs tablet Generic drug: dapagliflozin propanediol Take 10 mg by mouth in the morning.   furosemide 20 MG tablet Commonly known as: LASIX Take 20 mg by mouth daily as needed for edema.   hydrochlorothiazide 12.5 MG capsule Commonly known as: MICROZIDE Take 12.5 mg by mouth every morning.   isosorbide dinitrate 5 MG tablet Commonly known as: ISORDIL Take 1 tablet by mouth twice daily   losartan 25 MG tablet Commonly known as: COZAAR Take 25 mg by mouth at bedtime.   Magnesium 400 MG Tabs Take 400 mg by mouth 2 (two) times a week.   multivitamin with minerals tablet Take 1 tablet by mouth daily.   PROBIOTIC PO Take 1 capsule by mouth 2 (two) times a week.   rosuvastatin 40 MG tablet Commonly known as: CRESTOR Take 1 tablet by mouth once daily   Synthroid 100 MCG tablet Generic drug: levothyroxine Take 1 tablet (100 mcg total) by mouth daily before breakfast.   vitamin D3 25 MCG tablet Commonly known as: CHOLECALCIFEROL Take 1,000 Units by mouth 2 (two) times a week.   zinc gluconate 50 MG tablet Take 50 mg by mouth 2 (two) times a week.          REVIEW OF SYSTEMS: A comprehensive ROS was conducted with the patient and is negative except as per HPI    OBJECTIVE:  VS: There were no vitals taken for this visit.  Wt Readings from Last 3 Encounters:  09/14/22 210 lb (95.3 kg)  08/16/22 205 lb 6.4 oz (93.2 kg)  08/03/22 206 lb  (93.4 kg)     EXAM: General: Pt appears well and is in NAD  Neck: General: Supple without adenopathy. Thyroid: Thyroid size normal.  No goiter or nodules appreciated.  Lungs: Clear with good BS bilat with no rales, rhonchi, or wheezes  Heart: Auscultation: RRR.  Abdomen: Normoactive bowel sounds, soft, nontender, without masses or organomegaly palpable  Extremities:  BL LE: No pretibial edema normal ROM and strength.  Mental Status: Judgment, insight: Intact Orientation: Oriented to time, place, and person Mood and affect: No depression, anxiety, or agitation     DATA REVIEWED:  Latest Reference Range & Units 06/27/22 15:19  TSH 0.35 - 5.50 uIU/mL 8.64 (H)     Latest Reference Range & Units 05/21/22 15:12  Sodium  135 - 145 mmol/L 142  Potassium 3.5 - 5.1 mmol/L 3.6  Chloride 98 - 111 mmol/L 106  CO2 22 - 32 mmol/L 27  Glucose 70 - 99 mg/dL 98  BUN 8 - 23 mg/dL 22  Creatinine 0.44 - 1.00 mg/dL 1.04 (H)  Calcium 8.9 - 10.3 mg/dL 10.1  Anion gap 5 - 15  9  GFR, Estimated >60 mL/min 57 (L)      Latest Reference Range & Units 01/15/22 15:01  Hemoglobin A1C 4.0 - 5.6 % 5.9 !    ASSESSMENT/PLAN/RECOMMENDATIONS:   Postoperative Hypothyroidism    - Pt is  clinically euthyroid  - No local neck symptoms - TSH elevated, will increase levothyroxine dose - Pt educated extensively on the correct way to take levothyroxine (first thing in the morning with water, 30 minutes before eating or taking other medications). - Pt encouraged to double dose the following day if she were to miss a dose given long half-life of levothyroxine.  Medications : Stop Synthroid 88 mcg daily  Start Synthroid 100 mcg daily     2. Pre-diabetes :   - A1c stable 6.1 %  -I have encouraged her to continue with lifestyle changes   F/U 3 months   Signed electronically by: Mack Guise, MD  [facility]  [facility] [address] [place],  [place] [address] Phone: [phone] FAX: [phone]   CC: Berneta Levins, [place] [address] Phone: [phone] Fax: [phone]   Return to Endocrinology clinic as below: Future Appointments  Date Time Provider [place]  10/15/2022  3:00 PM Lindwood Mogel, Melanie Crazier, MD [facility] None  10/22/2022  7:30 AM Marrisa Kimber, Melanie Crazier, MD [facility] None  11/09/2022  9:45 AM Clydell Hakim, RD [place] NDM  11/23/2022  9:40 AM Girtha Rm, NP-C [facility] None  11/28/2022  3:00 PM Ofilia Neas, PA-C [facility] None  01/27/2024 11:00 AM Truitt Merle, MD [facility] None

## 2022-10-22 ENCOUNTER — Encounter: Payer: Self-pay | Admitting: Internal Medicine

## 2022-10-22 ENCOUNTER — Ambulatory Visit: Payer: Medicare Other | Admitting: Internal Medicine

## 2022-10-22 VITALS — BP 120/74 | HR 82 | Ht 65.0 in | Wt 212.0 lb

## 2022-10-22 DIAGNOSIS — R7303 Prediabetes: Secondary | ICD-10-CM | POA: Diagnosis not present

## 2022-10-22 DIAGNOSIS — E890 Postprocedural hypothyroidism: Secondary | ICD-10-CM | POA: Diagnosis not present

## 2022-10-22 LAB — POCT GLUCOSE (DEVICE FOR HOME USE): Glucose Fasting, POC: 81 mg/dL (ref 70–99)

## 2022-10-22 LAB — POCT GLYCOSYLATED HEMOGLOBIN (HGB A1C): Hemoglobin A1C: 5.7 % — AB (ref 4.0–5.6)

## 2022-10-22 LAB — T4, FREE: Free T4: 1.32 ng/dL (ref 0.60–1.60)

## 2022-10-22 LAB — TSH: TSH: 4.15 u[IU]/mL (ref 0.35–5.50)

## 2022-10-22 MED ORDER — SYNTHROID 100 MCG PO TABS: 100.0000 ug | ORAL_TABLET | Freq: Every day | ORAL | 3 refills | Status: DC

## 2022-10-22 NOTE — Progress Notes (Signed)
Name: Claudia Cantrell  MRN/ DOB: [id number], Apr 24, 1946    Age/ Sex: 76 y.o., female    PCP: Berneta Levins, DO   Reason for Endocrinology Evaluation: Hypothyroidism     Date of Initial Endocrinology Evaluation: 10/22/2022     HPI: Claudia Cantrell is a 76 y.o. female with a past medical history of postoperative hypothyroidism, HTN, dyslipidemia and A.Fib . The patient presented for initial endocrinology clinic visit on 10/22/2022 for consultative assistance with her Hypothyroidism.   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    She was seen by our CDE on 09/14/2022 She was seen by rheumatology in September 2023 for gout follow up  She had a follow-up with cardiology on 08/03/2022, continues to be on amiodarone which was started in June 2023 Weight stable  She is S/p cardioversion for  A. Fib , palpitations resolved  She has been drinking sugar-sweetened beverages    Synthroid 100 mcg daily      HISTORY:  Past Medical History:  Past Medical History:  Diagnosis Date   Arthritis    rheumatoid   CAD (coronary artery disease)    a. 1992 s/p MI and PTCA of unknown vessel;  b. 09/2010 Cath: LM nl, LAD 20p, LCX 71m RCA 127mRPL 20.   Cervical cancer ([facility]   Chronic kidney disease    Family history of anesthesia complication    daughter has difficulty waking    GERD (gastroesophageal reflux disease)    occ   Gout 10/08/2016   Hyperlipidemia    Hypertensive heart disease    Hypothyroidism    Nonischemic cardiomyopathy ([facility]   a. 07/2016 Echo: EF 40-45%, mild LVH, inferior akinesis, moderately dilated left atrium, trivial AI  and MR.   PAF (paroxysmal atrial fibrillation) ([facility]   a. 07/2016 Admitted w/ AF RVR-->CHA2DS2VASc = 5-->Eliquis;  b. 07/2016 successful TEE/DCCV.   Sleep apnea    a. Using CPAP.   Past Surgical History:  Past Surgical History:  Procedure Laterality Date   ABDOMINAL HYSTERECTOMY  1988   ATRIAL FIBRILLATION ABLATION N/A 07/28/2021   Procedure: ATRIAL FIBRILLATION ABLATION;  Surgeon: CaConstance HawMD;  [facility];  Service: Cardiovascular;  Laterality: N/A;   [place] BIOPSY  09/02/2019   Procedure: BIOPSY;  Surgeon: MaRush LandmarkaTelford Nab MD;  [facility];  Service: Gastroenterology;;   BIOPSY  12/09/2019   Procedure: BIOPSY;  Surgeon: MaIrving Copas MD;  [facility];  Service: Gastroenterology;;   BIOPSY  03/08/2021   Procedure: BIOPSY;  Surgeon: MaIrving Copas MD;  [facility];  Service: Gastroenterology;;   [place] PTCA BY DR CHCamp/A 08/01/2016   Procedure: CARDIOVERSION;  Surgeon: BrLelon PerlaMD;  [facility];  Service: Cardiovascular;  Laterality: N/A;   CARDIOVERSION N/A 01/11/2020   Procedure: CARDIOVERSION;  Surgeon: NiJosue HectorMD;  [facility];  Service: Cardiovascular;  Laterality: N/A;   CARDIOVERSION N/A 07/08/2020   Procedure: CARDIOVERSION;  Surgeon: ScDonato HeinzMD;  Location:  [place] ENDOSCOPY;  Service: Cardiovascular;  Laterality: N/A;   CARDIOVERSION N/A 05/31/2022   Procedure: CARDIOVERSION;  Surgeon: [name], MD;  Location: [facility] END[facility]COPY;  Service: Cardiovascular;  Laterality: N/A;   CARDIOVERSION N/A 07/18/2022   Procedure: CARDIOVERSION;  Surgeon: [name], MD;  Location: [place];  Service: Cardiovascular;  Laterality: N/A;   ENDOSCOPIC MUCOSAL RESECTION N/A 12/09/2019   Procedure: ENDOSCOPIC MUCOSAL RESECTION;  Surgeon: Rush Landmark Telford Nab., MD;   [facility];  Service: Gastroenterology;  Laterality: N/A;   ESOPHAGOGASTRODUODENOSCOPY N/A 03/08/2021   Procedure: ESOPHAGOGASTRODUODENOSCOPY (EGD);  Surgeon: Irving Copas., MD;  [facility];  Service: Gastroenterology;  Laterality: N/A;   ESOPHAGOGASTRODUODENOSCOPY (EGD) WITH PROPOFOL N/A 09/02/2019   Procedure: ESOPHAGOGASTRODUODENOSCOPY (EGD) WITH PROPOFOL;  Surgeon: Rush Landmark Telford Nab., MD;  [facility];  Service: Gastroenterology;  Laterality: N/A;   ESOPHAGOGASTRODUODENOSCOPY (EGD) WITH PROPOFOL N/A 12/09/2019   Procedure: ESOPHAGOGASTRODUODENOSCOPY (EGD) WITH PROPOFOL;  Surgeon: Rush Landmark Telford Nab., MD;  [facility];  Service: Gastroenterology;  Laterality: N/A;   EUS N/A 09/02/2019   Procedure: UPPER ENDOSCOPIC ULTRASOUND (EUS) RADIAL;  Surgeon: Irving Copas., MD;  [facility];  Service: Gastroenterology;  Laterality: N/A;  EUS radial/linear   EUS N/A 12/09/2019   Procedure: UPPER ENDOSCOPIC ULTRASOUND (EUS) RADIAL;  Surgeon: Irving Copas., MD;  [facility];  Service: Gastroenterology;  Laterality: N/A;   EUS  12/09/2019   Procedure: UPPER ENDOSCOPIC ULTRASOUND (EUS) LINEAR;  Surgeon: Irving Copas., MD;  [facility];  Service: Gastroenterology;;   EUS N/A 03/08/2021   Procedure: UPPER ENDOSCOPIC ULTRASOUND (EUS) RADIAL;  Surgeon: Irving Copas., MD;  [facility];  Service: Gastroenterology;  Laterality: N/A;   FINE NEEDLE ASPIRATION  09/02/2019   Procedure: FINE NEEDLE ASPIRATION (FNA) LINEAR;  Surgeon: Irving Copas., MD;  [facility];  Service: Gastroenterology;;   FINE NEEDLE ASPIRATION  12/09/2019   Procedure: FINE NEEDLE ASPIRATION (FNA) LINEAR;  Surgeon: Irving Copas., MD;  [facility];  Service: Gastroenterology;;   HEMOSTASIS CLIP PLACEMENT  12/09/2019   Procedure: HEMOSTASIS CLIP PLACEMENT;  Surgeon:  Irving Copas., MD;  [facility];  Service: Gastroenterology;;   ORIF ANKLE FRACTURE Right 04/27/2015   Procedure: OPEN REDUCTION INTERNAL FIXATION (ORIF) RIGHT BIMALLEOLAR ANKLE FRACTURE WITH SYNDESMOSIS FIXATION;  Surgeon: Leandrew Koyanagi, MD;  Location: [place];  Service: Orthopedics;  Laterality: Right;   SUBMUCOSAL LIFTING INJECTION  12/09/2019   Procedure: SUBMUCOSAL LIFTING INJECTION;  Surgeon: Irving Copas., MD;  [facility];  Service: Gastroenterology;;   TEE WITHOUT CARDIOVERSION N/A 08/01/2016   Procedure: TRANSESOPHAGEAL ECHOCARDIOGRAM (TEE);  Surgeon: Lelon Perla, MD;  Location[facility] END[facility]COPY;  Service: Cardiovascular;  Laterality: N/A;   THYROIDECTOMY  11/24/2013   DR Dalbert Batman   THYROIDECTOMY N/A 11/24/2013   Procedure: TOTAL THYROIDECTOMY;  Surgeon: Adin Hector, MD;  Location: [place];  Service: General;  Laterality: N/A;   TRANSTHORACIC ECHOCARDIOGRAM  01/15/2013   EF 55% TO 65%. PROBABLE MILD HYPOKINESIS OF THE INFERIOR MYOCARDIUM. GRADE 1 DIASTOLIC DYSFUNCTION. TRIAL AR.LA IS MILDLY DILATED.    Social History:  reports that she quit smoking about 31 years ago. Her smoking use included cigarettes. She has a 15.00 pack-year smoking history. She has never been exposed to tobacco smoke. She has never used smokeless tobacco. She reports current alcohol use. She reports that she does not use drugs. Family History: family history includes Bone cancer in her sister; Diabetes in her father; Heart  disease in her brother and mother; Melanoma in her brother; Stroke in her father; Sudden death in her brother and mother.   HOME MEDICATIONS: Allergies as of 10/22/2022       Reactions   Labetalol    headaches   Codeine Anxiety        Medication List        Accurate as of October 22, 2022  7:56 AM. If you have any questions, ask your nurse or doctor.          acetaminophen 500 MG tablet Commonly known as: TYLENOL Take 1,000 mg by mouth  every 6 (six) hours as needed for moderate pain.   allopurinol 100 MG tablet Commonly known as: ZYLOPRIM Take 1 tablet (100 mg total) by mouth daily.   amiodarone 200 MG tablet Commonly known as: Pacerone Take 1 tablet (200 mg total) by mouth daily.   amLODipine 5 MG tablet Commonly known as: NORVASC TAKE 1 & 1/2 (ONE & ONE-HALF) TABLETS BY MOUTH IN THE EVENING   carvedilol 6.25 MG tablet Commonly known as: COREG Take 1 tablet (6.25 mg total) by mouth 2 (two) times daily with a meal.   Eliquis 5 MG Tabs tablet Generic drug: apixaban Take 1 tablet by mouth twice daily   Farxiga 10 MG Tabs tablet Generic drug: dapagliflozin propanediol Take 10 mg by mouth in the morning.   furosemide 20 MG tablet Commonly known as: LASIX Take 20 mg by mouth daily as needed for edema.   hydrochlorothiazide 12.5 MG capsule Commonly known as: MICROZIDE Take 12.5 mg by mouth every morning.   isosorbide dinitrate 5 MG tablet Commonly known as: ISORDIL Take 1 tablet by mouth twice daily   losartan 25 MG tablet Commonly known as: COZAAR Take 25 mg by mouth at bedtime.   Magnesium 400 MG Tabs Take 400 mg by mouth 2 (two) times a week.   multivitamin with minerals tablet Take 1 tablet by mouth daily.   PROBIOTIC PO Take 1 capsule by mouth 2 (two) times a week.   rosuvastatin 40 MG tablet Commonly known as: CRESTOR Take 1 tablet by mouth once daily   Synthroid 100 MCG tablet Generic drug: levothyroxine Take 1 tablet (100 mcg total) by mouth daily before breakfast.   vitamin D3 25 MCG tablet Commonly known as: CHOLECALCIFEROL Take 1,000 Units by mouth 2 (two) times a week.   zinc gluconate 50 MG tablet Take 50 mg by mouth 2 (two) times a week.          REVIEW OF SYSTEMS: A comprehensive ROS was conducted with the patient and is negative except as per HPI    OBJECTIVE:  VS: BP 120/74 (BP Location: Left Arm, Patient Position: Sitting, Cuff Size: Large)   Pulse 82   Ht  _0  (1.651 m)   Wt 212 lb (96.2 kg)   SpO2 94%   BMI 35.28 kg/m   Wt Readings from Last 3 Encounters:  10/22/22 212 lb (96.2 kg)  09/14/22 210 lb (95.3 kg)  08/16/22 205 lb 6.4 oz (93.2 kg)     EXAM: General: Pt appears well and is in NAD  Neck: General: Supple without adenopathy. Thyroid: Thyroid size normal.  No goiter or nodules appreciated.  Lungs: Clear with good BS bilat with no rales, rhonchi, or wheezes  Heart: Auscultation: RRR.  Abdomen: Normoactive bowel sounds, soft, nontender, without masses or organomegaly palpable  Extremities:  BL LE: No pretibial edema normal ROM and strength.  Mental Status: Judgment,  insight: Intact Orientation: Oriented to time, place, and person Mood and affect: No depression, anxiety, or agitation     DATA REVIEWED:   Latest Reference Range & Units 10/22/22 08:07  TSH 0.35 - 5.50 uIU/mL 4.15  T4,Free(Direct) 0.60 - 1.60 ng/dL 1.32     Latest Reference Range & Units 09/17/22 14:36  Sodium 135 - 146 mmol/L 144  Potassium 3.5 - 5.3 mmol/L 4.2  Chloride 98 - 110 mmol/L 106  CO2 20 - 32 mmol/L 28  Glucose 65 - 99 mg/dL 93  BUN 7 - 25 mg/dL 18  Creatinine 0.60 - 1.00 mg/dL 1.58 (H)  Calcium 8.6 - 10.4 mg/dL 9.9  BUN/Creatinine Ratio 6 - 22 (calc) 11  eGFR > OR = 60 mL/min/1.39m 34 (L)  AG Ratio 1.0 - 2.5 (calc) 1.5  Uric Acid, Serum 2.5 - 7.0 mg/dL 3.3  AST 10 - 35 U/L 17  ALT 6 - 29 U/L 15  Total Protein 6.1 - 8.1 g/dL 7.8  Total Bilirubin 0.2 - 1.2 mg/dL 0.6    ASSESSMENT/PLAN/RECOMMENDATIONS:   Postoperative Hypothyroidism    - Pt is  clinically euthyroid  - No local neck symptoms - TSH is normal    Medications :  Synthroid 100 mcg daily     2. Pre-diabetes :   - A1c stable 5.7 %  down from 6.2%  -I have encouraged her to continue with lifestyle changes      F/U 4 months   Signed electronically by: AMack Guise MD  [facility]  [address], [place] [address]Phone: [phone]: [phone]  CC: CBerneta Levins DOak Grove[place]Phone: [phone]: [phone]  Return to Endocrinology clinic as below: Future Appointments  Date Time Provider DJunction 11/09/2022  9:45 AM JClydell Hakim RD [facility]  11/23/2022  9:40 AM HGirtha Rm NP-C [facility] None  11/28/2022  3:00 PM DOfilia Neas PA-C [facility] None  01/27/2024 11:00 AM FTruitt Merle MD [facility]

## 2022-10-22 NOTE — Patient Instructions (Signed)

## 2022-10-26 ENCOUNTER — Telehealth: Payer: Self-pay

## 2022-10-26 NOTE — Telephone Encounter (Signed)
Patient given lab results and verbalized understanding

## 2022-11-03 ENCOUNTER — Other Ambulatory Visit: Payer: Self-pay | Admitting: Cardiovascular Disease

## 2022-11-03 DIAGNOSIS — I4819 Other persistent atrial fibrillation: Secondary | ICD-10-CM

## 2022-11-05 NOTE — Telephone Encounter (Signed)
Prescription refill request for Eliquis received. Indication:afib Last office visit:8/23 Scr:1.5 Age: 76 Weight:96.2  kg  Prescription refilled

## 2022-11-09 ENCOUNTER — Encounter: Payer: Self-pay | Admitting: Dietician

## 2022-11-09 ENCOUNTER — Encounter: Payer: Medicare Other | Attending: Geriatric Medicine | Admitting: Dietician

## 2022-11-09 VITALS — Wt 213.0 lb

## 2022-11-09 DIAGNOSIS — Z713 Dietary counseling and surveillance: Secondary | ICD-10-CM | POA: Diagnosis not present

## 2022-11-09 DIAGNOSIS — R7303 Prediabetes: Secondary | ICD-10-CM

## 2022-11-09 NOTE — Progress Notes (Signed)
.   Diabetes Self-Management Education  Visit Type: Follow-up  Appt. Start Time: 0950 Appt. End Time: 1030  11/09/2022  [name], identified by name and date of birth, is a 76 y.o. female with a diagnosis of Diabetes: Pre-Diabetes.   ASSESSMENT Patient is here today alone.  She was last seen by myself on 09/14/2022 for prediabetes.  She would prefer to do a one on one visit with this RD rather than the Diabetes Prevention Program. She sees a nephrologist every 2-3 months.   Primary concerns today: Patient would like to learn more about nutrition and how to change her behavior to improve her health, lose weight, and avoid diabetes.  She states that she struggles with sugar cravings.  Has a lot of guilt over dietary indiscretion. Hasn't been exercising due to feeling more fatigued and nervous to exercise due to afib. Changed from regular milk to almond milk.  History includes:  Prediabetes, CKD, afib, cervical cancer hx, HLD, HTN, GERD, hypothyroidism, OSA on c-pap Labs noted to include: A1C 6.1% 05/09/2022 and has been in the prediabetes since since 2019 or longer.  BUN 18, Creatinine 1.58, GFR 34, Potassium 3.9 09/17/2022, vitamin D 55 05/09/2022, proteinuria at times Medications include:  Farxiga, vitamin D, probiotic, lasix, allopurinol, MVI, synthroid Sleep 8 hours per night   Weight hx: 65" 213 lbs 11/09/2022.  States that she had lost to 203-206 lbs until 3 weeks ago when she started overeating with holidays. 210 lbs 09/14/2022 gained with increased snacking in the afternoon (chips and dip) UBW 202-206 lbs   Patient lives alone.  She is on disability and is retired. Has been unable to walk due to her heart condition.  She states she used to be more active when she was young (gardening, roller skating, biking).  Was less active as the children left home and began eating more "junk". 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  Dislikes beef, likes vegetables.  Stopped soda. Eats increased amounts of  processed/high sodium meat  Weight 213 lb (96.6 kg). Body mass index is 35.45 kg/m.   Diabetes Self-Management Education - 11/09/22 1740       Visit Information   Visit Type Follow-up      Initial Visit   Diabetes Type Pre-Diabetes    Are you taking your medications as prescribed? Not on Medications      Psychosocial Assessment   Self-care barriers None    Other persons present Patient    Patient Concerns Nutrition/Meal planning;Weight Control;Healthy Lifestyle    Special Needs None      Pre-Education Assessment   Patient understands the diabetes disease and treatment process. Comprehends key points    Patient understands incorporating nutritional management into lifestyle. Comprehends key points    Patient undertands incorporating physical activity into lifestyle. Comprehends key points    Patient understands using medications safely. Comprehends key points    Patient understands monitoring blood glucose, interpreting and using results Comprehends key points    Patient understands prevention, detection, and treatment of acute complications. Comprehends key points    Patient understands prevention, detection, and treatment of chronic complications. Compreheands key points    Patient understands how to develop strategies to address psychosocial issues. Comprehends key points    Patient understands how to develop strategies to promote health/change behavior. Needs Review      Complications   How often do you check your blood sugar? Not recommended by provider      Dietary Intake   Breakfast plain yogurt, granola,  apple    Snack (morning) grapes    Lunch greek salad    Dinner broccoli, frozen alfredo meal, rotisserie chicken, small salad    Snack (evening) none    Beverage(s) tea with lemon, lemon water with cinnamon, selzer water, occasional coffee      Activity / Exercise   Activity / Exercise Type ADL's      Patient Education   Previous Diabetes Education Yes (please  comment)   09/14/2022   Healthy Eating Plate Method;Meal options for control of blood glucose level and chronic complications.    Being Active Role of exercise on diabetes management, blood pressure control and cardiac health.;Helped patient identify appropriate exercises in relation to his/her diabetes, diabetes complications and other health issue.      Individualized Goals (developed by patient)   Nutrition General guidelines for healthy choices and portions discussed    Physical Activity Exercise 3-5 times per week;15 minutes per day    Medications Not Applicable    Monitoring  Not Applicable    Problem Solving Eating Pattern      Patient Self-Evaluation of Goals - Patient rates self as meeting previously set goals (% of time)   Nutrition 50 - 75 % (half of the time)    Physical Activity < 25% (hardly ever/never)    Medications Not Applicable    Monitoring Not Applicable    Reducing Risk (treating acute and chronic complications) 25 - 96% (sometimes)    Health Coping 25 - 50% (sometimes)      Post-Education Assessment   Patient understands the diabetes disease and treatment process. Comprehends key points    Patient understands incorporating nutritional management into lifestyle. Comprehends key points    Patient undertands incorporating physical activity into lifestyle. Comprehends key points    Patient understands using medications safely. Comphrehends key points    Patient understands monitoring blood glucose, interpreting and using results Comprehends key points    Patient understands prevention, detection, and treatment of acute complications. Comprehends key points    Patient understands prevention, detection, and treatment of chronic complications. Comprehends key points    Patient understands how to develop strategies to address psychosocial issues. Comprehends key points    Patient understands how to develop strategies to promote health/change behavior. Needs Review       Outcomes   Expected Outcomes Demonstrated interest in learning but significant barriers to change    Future DMSE 2 months    Program Status Not Completed      Subsequent Visit   Since your last visit have you continued or begun to take your medications as prescribed? Not on Medications    Since your last visit have you experienced any weight changes? Gain    Weight Gain (lbs) 3             Individualized Plan for Diabetes Self-Management Training:   Learning Objective:  Patient will have a greater understanding of diabetes self-management. Patient education plan is to attend individual and/or group sessions per assessed needs and concerns.   Plan:   Patient Instructions  Breakfast ideas: Steel cut oats (5 minute variety) - these easily reheat, nuts, fruit Plain yogurt, 1/4 cup granola, fruit  Consider ground Flaxseeds (1-2 Tablespoons) with your breakfast.  This can help your constipation.  Start this slowly. (Store in the freezer.)    Ask your doctor if you would be a candidate for Cardiac Rehab. [place] gym ([place] site) Walk at the mall.  Every little bit  adds up.  (Ask your doctor what is safe for you)  Resources:   Full Plate Living.org Diabetes Food [employer].hu  Mindfulness:  Consistently scheduled meal - avoid skipping  Choices - make these choices that match your goals in the grocery store - Nutrition Quality  Eat slowly  Away from distraction (sitting in kitchen or dining room)  Stop eating when satisfied  Before a snack ask, "Am I hungry or eating for another reason?"   "What can I do instead if I am not hungry?"  Try to find something every day that brings you joy!    Expected Outcomes:  Demonstrated interest in learning but significant barriers to change  Education material provided:   If problems or questions, patient to contact team via:  Phone  Future DSME appointment: 2 months

## 2022-11-09 NOTE — Patient Instructions (Addendum)
Breakfast ideas: Steel cut oats (5 minute variety) - these easily reheat, nuts, fruit Plain yogurt, 1/4 cup granola, fruit  Consider ground Flaxseeds (1-2 Tablespoons) with your breakfast.  This can help your constipation.  Start this slowly. (Store in the freezer.)    Ask your doctor if you would be a candidate for Cardiac Rehab. [place] gym ([place] site) Walk at the mall.  Every little bit adds up.  (Ask your doctor what is safe for you)  Resources:   Full Plate Living.org Diabetes Food [employer]hu  Mindfulness:  Consistently scheduled meal - avoid skipping  Choices - make these choices that match your goals in the grocery store - Nutrition Quality  Eat slowly  Away from distraction (sitting in kitchen or dining room)  Stop eating when satisfied  Before a snack ask, "Am I hungry or eating for another reason?"   "What can I do instead if I am not hungry?"  Try to find something every day that brings you joy!

## 2022-11-12 ENCOUNTER — Other Ambulatory Visit: Payer: Self-pay | Admitting: Cardiovascular Disease

## 2022-11-15 NOTE — Progress Notes (Deleted)
Office Visit Note  Patient: Claudia Cantrell             Date of Birth: 1946/05/27           MRN: [id number]             PCP: Berneta Levins, DO Referring: Berneta Levins, * Visit Date: 11/28/2022 Occupation: '@GUAROCC'$ @  Subjective:    History of Present Illness: Claudia Cantrell is a 76 y.o. female with history of gout. She is taking allopurinol 100 mg daily for management of gout. Allopurinol was restarted after her last office visit 08/16/22.    Uric acid was within the desirable range: 3.3 on 09/17/22.  Uric acid will be rechecked today along with CBC and CMP.    She is scheduled for a TEE on 12/13/22 and an atrial fibrillation ablation on 12/14/22.    Activities of Daily Living:  Patient reports morning stiffness for *** {minute/hour:19697}.   Patient {ACTIONS;DENIES/REPORTS:21021675::"Denies"} nocturnal pain.  Difficulty dressing/grooming: {ACTIONS;DENIES/REPORTS:21021675::"Denies"} Difficulty climbing stairs: {ACTIONS;DENIES/REPORTS:21021675::"Denies"} Difficulty getting out of chair: {ACTIONS;DENIES/REPORTS:21021675::"Denies"} Difficulty using hands for taps, buttons, cutlery, and/or writing: {ACTIONS;DENIES/REPORTS:21021675::"Denies"}  No Rheumatology ROS completed.   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/27/2022   Acquired hypothyroidism 10/30/2021   Stromal tumor determined by gastric biopsy 06/24/2020   Iatrogenic hyperthyroidism 05/02/2020   Dyslipidemia, goal LDL below 70 05/02/2020   Acute combined systolic and diastolic CHF, NYHA class 3 ([place]) 09/23/2019   Persistent atrial fibrillation ([place]) 09/21/2019   Submucosal lesion of stomach 07/19/2019   Abnormal CT of the abdomen 07/19/2019   Abnormal CT scan,  stomach 06/17/2019   Advance directive declined by patient 01/14/2019   Chronic anticoagulation 01/14/2019   CRI (chronic renal insufficiency), stage 2 (mild) 01/14/2019   Prediabetes 01/14/2019   Persistent proteinuria 02/17/2018   Hyperuricemia 03/26/2017   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   Vitamin D deficiency 03/26/2017   Medication monitoring encounter 03/26/2017   Gout 10/08/2016   Essential hypertension    CAD S/P percutaneous coronary angioplasty    Nonischemic cardiomyopathy ([place])    Chest pain with moderate risk for cardiac etiology 08/02/2016   Closed right ankle fracture 04/24/2015   Ankle fracture, bimalleolar, closed 04/24/2015   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   OSA on CPAP 10/09/2013    Past Medical History:  Diagnosis Date   Arthritis    rheumatoid   CAD (coronary artery disease)    a. 1992 s/p MI and PTCA of unknown vessel;  b. 09/2010 Cath: LM nl, LAD 20p, LCX 10m RCA 168mRPL 20.   Cervical cancer ([facility]   Chronic kidney disease    Family history of anesthesia complication    daughter has difficulty waking    GERD (gastroesophageal reflux disease)    occ   Gout 10/08/2016   Hyperlipidemia    Hypertensive heart disease    Hypothyroidism    Nonischemic cardiomyopathy ([facility]   a. 07/2016 Echo: EF 40-45%, mild LVH, inferior akinesis, moderately dilated left atrium, trivial AI and MR.   PAF (paroxysmal atrial fibrillation) ([facility]   a. 07/2016 Admitted w/ AF RVR-->CHA2DS2VASc = 5-->Eliquis;  b. 07/2016 successful TEE/DCCV.   Sleep apnea    a. Using CPAP.    Family History  Problem Relation  Age of Onset   Heart disease Mother    Sudden death Mother    Diabetes Father    Stroke Father    Bone cancer Sister    Sudden death Brother    Melanoma Brother    Heart disease Brother    Colon cancer Neg Hx    Esophageal cancer Neg Hx    Inflammatory bowel disease Neg Hx    Liver disease Neg Hx    Pancreatic cancer Neg Hx     Rectal cancer Neg Hx    Stomach cancer Neg Hx    Past Surgical History:  Procedure Laterality Date   ABDOMINAL HYSTERECTOMY  1988   ATRIAL FIBRILLATION ABLATION N/A 07/28/2021   Procedure: ATRIAL FIBRILLATION ABLATION;  Surgeon: Constance Haw, MD;  Location: [facility];  Service: Cardiovascular;  Laterality: N/A;   [place]   BIOPSY  09/02/2019   Procedure: BIOPSY;  Surgeon: Rush Landmark Telford Nab., MD;  Location: [facility];  Service: Gastroenterology;;   BIOPSY  12/09/2019   Procedure: BIOPSY;  Surgeon: Irving Copas., MD;  Location: [facility];  Service: Gastroenterology;;   BIOPSY  03/08/2021   Procedure: BIOPSY;  Surgeon: Irving Copas., MD;  Location: [facility];  Service: Gastroenterology;;   [place]   PTCA BY DR Potosi N/A 08/01/2016   Procedure: CARDIOVERSION;  Surgeon: Lelon Perla, MD;  Location[facility] END[facility]COPY;  Service: Cardiovascular;  Laterality: N/A;   CARDIOVERSION N/A 01/11/2020   Procedure: CARDIOVERSION;  Surgeon: Josue Hector, MD;  Location: [facility] ENDOSCOPY;  Service: Cardiovascular;  Laterality: N/A;   CARDIOVERSION N/A 07/08/2020   Procedure: CARDIOVERSION;  Surgeon: [name], MD;  Location: [facility] ENDOSCOPY;  Service: Cardiovascular;  Laterality: N/A;   CARDIOVERSION N/A 05/31/2022   Procedure: CARDIOVERSION;  Surgeon: [name], MD;  Location: [place];  Service: Cardiovascular;  Laterality: N/A;   CARDIOVERSION N/A 07/18/2022   Procedure: CARDIOVERSION;  Surgeon: [name], MD;  Location: [place];  Service: Cardiovascular;  Laterality: N/A;   ENDOSCOPIC MUCOSAL RESECTION N/A 12/09/2019   Procedure: ENDOSCOPIC MUCOSAL RESECTION;  Surgeon: Irving Copas., MD;  Location: [facility] ENDOSCOPY;  Service: Gastroenterology;  Laterality: N/A;   ESOPHAGOGASTRODUODENOSCOPY N/A 03/08/2021   Procedure: ESOPHAGOGASTRODUODENOSCOPY (EGD);   Surgeon: Irving Copas., MD;  Location: [facility];  Service: Gastroenterology;  Laterality: N/A;   ESOPHAGOGASTRODUODENOSCOPY (EGD) WITH PROPOFOL N/A 09/02/2019   Procedure: ESOPHAGOGASTRODUODENOSCOPY (EGD) WITH PROPOFOL;  Surgeon: Rush Landmark Telford Nab., MD;  Location: [facility]OSCOPY;  Service: Gastroenterology;  Laterality: N/A;   ESOPHAGOGASTRODUODENOSCOPY (EGD) WITH PROPOFOL N/A 12/09/2019   Procedure: ESOPHAGOGASTRODUODENOSCOPY (EGD) WITH PROPOFOL;  Surgeon: Rush Landmark Telford Nab., MD;  Location: [facility] END[facility];  Service: Gastroenterology;  Laterality: N/A;   EUS N/A 09/02/2019   Procedure: UPPER ENDOSCOPIC ULTRASOUND (EUS) RADIAL;  Surgeon: Irving Copas., MD;  Location: [facility];  Service: Gastroenterology;  Laterality: N/A;  EUS radial/linear   EUS N/A 12/09/2019   Procedure: UPPER ENDOSCOPIC ULTRASOUND (EUS) RADIAL;  Surgeon: Irving Copas., MD;  Location: [facility];  Service: Gastroenterology;  Laterality: N/A;   EUS  12/09/2019   Procedure: UPPER ENDOSCOPIC ULTRASOUND (EUS) LINEAR;  Surgeon: Irving Copas., MD;  Location: [facility];  Service: Gastroenterology;;   EUS N/A 03/08/2021   Procedure: UPPER ENDOSCOPIC ULTRASOUND (EUS) RADIAL;  Surgeon: Irving Copas., MD;  Location: [facility];  Service: Gastroenterology;  Laterality: N/A;   FINE NEEDLE ASPIRATION  09/02/2019  Procedure: FINE NEEDLE ASPIRATION (FNA) LINEAR;  Surgeon: Irving Copas., MD;  Location: [facility];  Service: Gastroenterology;;   FINE NEEDLE ASPIRATION  12/09/2019   Procedure: FINE NEEDLE ASPIRATION (FNA) LINEAR;  Surgeon: Irving Copas., MD;  Location: [facility];  Service: Gastroenterology;;   HEMOSTASIS CLIP PLACEMENT  12/09/2019   Procedure: HEMOSTASIS CLIP PLACEMENT;  Surgeon: Irving Copas., MD;  Location: [facility];  Service: Gastroenterology;;   ORIF ANKLE FRACTURE Right 04/27/2015   Procedure: OPEN REDUCTION INTERNAL  FIXATION (ORIF) RIGHT BIMALLEOLAR ANKLE FRACTURE WITH SYNDESMOSIS FIXATION;  Surgeon: Leandrew Koyanagi, MD;  Location: [place];  Service: Orthopedics;  Laterality: Right;   SUBMUCOSAL LIFTING INJECTION  12/09/2019   Procedure: SUBMUCOSAL LIFTING INJECTION;  Surgeon: Irving Copas., MD;  Location: [facility];  Service: Gastroenterology;;   TEE WITHOUT CARDIOVERSION N/A 08/01/2016   Procedure: TRANSESOPHAGEAL ECHOCARDIOGRAM (TEE);  Surgeon: Lelon Perla, MD;  Location: [facility] ENDOSCOPY;  Service: Cardiovascular;  Laterality: N/A;   THYROIDECTOMY  11/24/2013   DR Dalbert Batman   THYROIDECTOMY N/A 11/24/2013   Procedure: TOTAL THYROIDECTOMY;  Surgeon: Adin Hector, MD;  Location: [place];  Service: General;  Laterality: N/A;   TRANSTHORACIC ECHOCARDIOGRAM  01/15/2013   EF 55% TO 65%. PROBABLE MILD HYPOKINESIS OF THE INFERIOR MYOCARDIUM. GRADE 1 DIASTOLIC DYSFUNCTION. TRIAL AR.LA IS MILDLY DILATED.   Social History   Social History Narrative   Right handed    Living  alone.   Immunization History  Administered Date(s) Administered   Fluad Quad(high Dose 65+) 08/22/2019, 09/20/2021   Influenza Nasal 11/25/2013   Influenza, High Dose Seasonal PF 12/30/2014, 10/14/2017, 11/01/2018   Influenza,inj,Quad PF,6+ Mos 11/25/2013   Influenza-Unspecified 12/30/2012, 11/09/2015, 12/24/2016, 09/16/2020   PFIZER(Purple Top)SARS-COV-2 Vaccination 12/29/2019, 01/18/2020, 09/16/2020   Pfizer Covid-19 Vaccine Bivalent Booster 5y-11y 10/21/2021   Pneumococcal Conjugate-13 12/30/2014, 10/14/2017   Pneumococcal Polysaccharide-23 11/25/2013, 08/22/2019   Zoster Recombinat (Shingrix) 10/14/2017, 01/21/2018   Zoster, Live 12/15/2008     Objective: Vital Signs: There were no vitals taken for this visit.   Physical Exam Vitals and nursing note reviewed.  Constitutional:      Appearance: She is well-developed.  HENT:     Head: Normocephalic and atraumatic.  Eyes:     Conjunctiva/sclera: Conjunctivae normal.   Cardiovascular:     Rate and Rhythm: Normal rate and regular rhythm.     Heart sounds: Normal heart sounds.  Pulmonary:     Effort: Pulmonary effort is normal.     Breath sounds: Normal breath sounds.  Abdominal:     General: Bowel sounds are normal.     Palpations: Abdomen is soft.  Musculoskeletal:     Cervical back: Normal range of motion.  Skin:    General: Skin is warm and dry.     Capillary Refill: Capillary refill takes less than 2 seconds.  Neurological:     Mental Status: She is alert and oriented to person, place, and time.  Psychiatric:        Behavior: Behavior normal.      Musculoskeletal Exam: ***  CDAI Exam: CDAI Score: -- Patient Global: --; Provider Global: -- Swollen: --; Tender: -- Joint Exam 11/28/2022   No joint exam has been documented for this visit   There is currently no information documented on the homunculus. Go to the Rheumatology activity and complete the homunculus joint exam.  Investigation: No additional findings.  Imaging: No results found.  Recent Labs: Lab Results  Component Value Date   WBC 4.0  09/17/2022   HGB 15.7 (H) 09/17/2022   PLT 210 09/17/2022   NA 144 09/17/2022   K 4.2 09/17/2022   CL 106 09/17/2022   CO2 28 09/17/2022   GLUCOSE 93 09/17/2022   BUN 18 09/17/2022   CREATININE 1.58 (H) 09/17/2022   BILITOT 0.6 09/17/2022   ALKPHOS 78 03/24/2022   AST 17 09/17/2022   ALT 15 09/17/2022   PROT 7.8 09/17/2022   ALBUMIN 4.6 03/24/2022   CALCIUM 9.9 09/17/2022   GFRAA 74 09/16/2020    Speciality Comments: No specialty comments available.  Procedures:  No procedures performed Allergies: Labetalol and Codeine   Assessment / Plan:     Visit Diagnoses: No diagnosis found.  Orders: No orders of the defined types were placed in this encounter.  No orders of the defined types were placed in this encounter.   Face-to-face time spent with patient was *** minutes. Greater than 50% of time was spent in  counseling and coordination of care.  Follow-Up Instructions: No follow-ups on file.   Earnestine Mealing, CMA  Note - This record has been created using Editor, commissioning.  Chart creation errors have been sought, but may not always  have been located. Such creation errors do not reflect on  the standard of medical care.

## 2022-11-23 ENCOUNTER — Ambulatory Visit: Payer: Medicare Other | Admitting: Family Medicine

## 2022-11-27 ENCOUNTER — Encounter: Payer: Self-pay | Admitting: *Deleted

## 2022-11-28 ENCOUNTER — Ambulatory Visit: Payer: Medicare Other | Attending: Physician Assistant | Admitting: Physician Assistant

## 2022-11-28 DIAGNOSIS — M65342 Trigger finger, left ring finger: Secondary | ICD-10-CM

## 2022-11-28 DIAGNOSIS — Z8679 Personal history of other diseases of the circulatory system: Secondary | ICD-10-CM

## 2022-11-28 DIAGNOSIS — E790 Hyperuricemia without signs of inflammatory arthritis and tophaceous disease: Secondary | ICD-10-CM

## 2022-11-28 DIAGNOSIS — Z8639 Personal history of other endocrine, nutritional and metabolic disease: Secondary | ICD-10-CM

## 2022-11-28 DIAGNOSIS — Z7901 Long term (current) use of anticoagulants: Secondary | ICD-10-CM

## 2022-11-28 DIAGNOSIS — G4733 Obstructive sleep apnea (adult) (pediatric): Secondary | ICD-10-CM

## 2022-11-28 DIAGNOSIS — Z8509 Personal history of malignant neoplasm of other digestive organs: Secondary | ICD-10-CM

## 2022-11-28 DIAGNOSIS — Z79899 Other long term (current) drug therapy: Secondary | ICD-10-CM

## 2022-11-28 DIAGNOSIS — M1A09X0 Idiopathic chronic gout, multiple sites, without tophus (tophi): Secondary | ICD-10-CM

## 2022-11-29 ENCOUNTER — Ambulatory Visit: Payer: Medicare Other | Admitting: Cardiology

## 2022-11-29 NOTE — Progress Notes (Deleted)
Electrophysiology Office Note   Date:  11/29/2022   ID:  GLENYS SNADER, DOB 08/11/1946, MRN [id number]  PCP:  Berneta Levins, DO  Cardiologist:  Claiborne Billings Primary Electrophysiologist:  Elazar Argabright Meredith Leeds, MD    Chief Complaint: atrial fibrillation   History of Present Illness: Claudia Cantrell is a 76 y.o. female who is being seen today for the evaluation of atrial fibrillation at the request of Combs, Diona Browner, *. Presenting today for electrophysiology evaluation.  He has a history seen for coronary artery disease, CKD, hypertension, hyperlipidemia, nonischemic cardiomyopathy, atrial fibrillation.  She is post ablation 07/28/2021.  She is unfortunate gone back into atrial fibrillation.  She is scheduled for repeat ablation 12/14/2021.  Today, denies symptoms of palpitations, chest pain, shortness of breath, orthopnea, PND, lower extremity edema, claudication, dizziness, presyncope, syncope, bleeding, or neurologic sequela. The patient is tolerating medications without difficulties. ***   Past Medical History:  Diagnosis Date   Arthritis    rheumatoid   CAD (coronary artery disease)    a. 1992 s/p MI and PTCA of unknown vessel;  b. 09/2010 Cath: LM nl, LAD 20p, LCX 14m RCA 175mRPL 20.   Cervical cancer ([facility]   Chronic kidney disease    Family history of anesthesia complication    daughter has difficulty waking    GERD (gastroesophageal reflux disease)    occ   Gout 10/08/2016   Hyperlipidemia    Hypertensive heart disease    Hypothyroidism    Nonischemic cardiomyopathy ([facility] [place]   a. 07/2016 Echo: EF 40-45%, mild LVH, inferior akinesis, moderately dilated left atrium, trivial AI and MR.   PAF ([place]oxysmal [place] fibrillation) [place] [place]   a. 07/2016 Admitted w/ AF RVR-->CHA2DS2VASc = 5-->Eliquis;  b. 07/2016 successful TEE/DCCV.   Sleep apnea    a. Using CPAP.   Past Surgical History:  Procedure Laterality Date   ABDOMINAL HYSTERECTOMY  1988   ATRIAL FIBRILLATION  ABLATION N/A 07/28/2021   Procedure: ATRIAL FIBRILLATION ABLATION;  Surgeon: CaConstance HawMD;  [facility];  Service: Cardiovascular;  Laterality: N/A;   BA[place]on BIOPSY  09/02/2019   Procedure: BIOPSY;  Surgeon: MaRush LandmarkaTelford Nab MD;  [facility];  Service: Gastroenterology;;   BIOPSY  12/09/2019   Procedure: BIOPSY;  Surgeon: MaIrving Copas MD;  [facility];  Service: Gastroenterology;;   BIOPSY  03/08/2021   Procedure: BIOPSY;  Surgeon: MaIrving Copas MD;  [facility];  Service: Gastroenterology;;   CABret Harte PTCA BY DR CHMorrison/A 08/01/2016   Procedure: CARDIOVERSION;  Surgeon: BrLelon PerlaMD;  [facility];  Service: Cardiovascular;  Laterality: N/A;   CARDIOVERSION N/A 01/11/2020   Procedure: CARDIOVERSION;  Surgeon: NiJosue HectorMD;  [facility];  Service: Cardiovascular;  Laterality: N/A;   CARDIOVERSION N/A 07/08/2020   Procedure: CARDIOVERSION;  Surgeon: ScDonato HeinzMD;  [facility];  Service: Cardiovascular;  Laterality: N/A;   CARDIOVERSION N/A 05/31/2022   Procedure: CARDIOVERSION;  Surgeon: HiPixie CasinoMD;  [place] [facility]: Cardiovascular;  Laterality: N/A;   CARDIOVERSION N/A 07/18/2022   Procedure: CARDIOVERSION;  Surgeon: ScDonato HeinzMD;  [facility] Service: Cardiovascular;  Laterality: N/A;   ENDOSCOPIC MUCOSAL RESECTION N/A 12/09/2019   Procedure: ENDOSCOPIC MUCOSAL RESECTION;  Surgeon: MaIrving Copas MD;  [facility];  Service: Gastroenterology;  Laterality: N/A;   ESOPHAGOGASTRODUODENOSCOPY N/A 03/08/2021   Procedure: ESOPHAGOGASTRODUODENOSCOPY (EGD);  Surgeon: Irving Copas., MD;  [facility];  Service: Gastroenterology;  Laterality: N/A;   ESOPHAGOGASTRODUODENOSCOPY (EGD) WITH PROPOFOL  N/A 09/02/2019   Procedure: ESOPHAGOGASTRODUODENOSCOPY (EGD) WITH PROPOFOL;  Surgeon: Rush Landmark Telford Nab., MD;  [facility];  Service: Gastroenterology;  Laterality: N/A;   ESOPHAGOGASTRODUODENOSCOPY (EGD) WITH PROPOFOL N/A 12/09/2019   Procedure: ESOPHAGOGASTRODUODENOSCOPY (EGD) WITH PROPOFOL;  Surgeon: Rush Landmark Telford Nab., MD;  [facility];  Service: Gastroenterology;  Laterality: N/A;   EUS N/A 09/02/2019   Procedure: UPPER ENDOSCOPIC ULTRASOUND (EUS) RADIAL;  Surgeon: Irving Copas., MD;  [facility];  Service: Gastroenterology;  Laterality: N/A;  EUS radial/linear   EUS N/A 12/09/2019   Procedure: UPPER ENDOSCOPIC ULTRASOUND (EUS) RADIAL;  Surgeon: Irving Copas., MD;  [facility];  Service: Gastroenterology;  Laterality: N/A;   EUS  12/09/2019   Procedure: UPPER ENDOSCOPIC ULTRASOUND (EUS) LINEAR;  Surgeon: Irving Copas., MD;  [facility];  Service: Gastroenterology;;   EUS N/A 03/08/2021   Procedure: UPPER ENDOSCOPIC ULTRASOUND (EUS) RADIAL;  Surgeon: Irving Copas., MD;  [facility];  Service: Gastroenterology;  Laterality: N/A;   FINE NEEDLE ASPIRATION  09/02/2019   Procedure: FINE NEEDLE ASPIRATION (FNA) LINEAR;  Surgeon: Irving Copas., MD;  [facility];  Service: Gastroenterology;;   FINE NEEDLE ASPIRATION  12/09/2019   Procedure: FINE NEEDLE ASPIRATION (FNA) LINEAR;  Surgeon: Irving Copas., MD;  [facility];  Service: Gastroenterology;;   HEMOSTASIS CLIP PLACEMENT  12/09/2019   Procedure: HEMOSTASIS CLIP PLACEMENT;  Surgeon: Irving Copas., MD;  [facility];  Service: Gastroenterology;;   ORIF ANKLE FRACTURE Right 04/27/2015   Procedure: OPEN REDUCTION INTERNAL FIXATION (ORIF) RIGHT BIMALLEOLAR ANKLE FRACTURE WITH SYNDESMOSIS FIXATION;  Surgeon: Leandrew Koyanagi, MD;  Location: [place];  Service: Orthopedics;  Laterality:  Right;   SUBMUCOSAL LIFTING INJECTION  12/09/2019   Procedure: SUBMUCOSAL LIFTING INJECTION;  Surgeon: Irving Copas., MD;  [facility];  Service: Gastroenterology;;   TEE WITHOUT CARDIOVERSION N/A 08/01/2016   Procedure: TRANSESOPHAGEAL ECHOCARDIOGRAM (TEE);  Surgeon: Lelon Perla, MD;  [facility];  Service: Cardiovascular;  Laterality: N/A;   THYROIDECTOMY  11/24/2013   DR Dalbert Batman   THYROIDECTOMY N/A 11/24/2013   Procedure: TOTAL THYROIDECTOMY;  Surgeon: Adin Hector, MD;  Location: [place];  Service: General;  Laterality: N/A;   TRANSTHORACIC ECHOCARDIOGRAM  01/15/2013   EF 55% TO 65%. PROBABLE MILD HYPOKINESIS OF THE INFERIOR MYOCARDIUM. GRADE 1 DIASTOLIC DYSFUNCTION. TRIAL AR.LA IS MILDLY DILATED.     Current Outpatient Medications  Medication Sig Dispense Refill   acetaminophen (TYLENOL) 500 MG tablet Take 1,000 mg by mouth every 6 (six) hours as needed for moderate pain.     allopurinol (ZYLOPRIM) 100 MG tablet Take 1 tablet (100 mg total) by mouth daily. 30 tablet 2   amiodarone (PACERONE) 200 MG tablet Take 1 tablet (200 mg total) by mouth daily. 30 tablet 3   amLODipine (NORVASC) 5 MG tablet TAKE 1 & 1/2 (ONE & ONE-HALF) TABLETS BY MOUTH IN THE EVENING 135 tablet 1   apixaban (ELIQUIS) 5 MG TABS tablet Take 1 tablet by mouth twice daily 180 tablet 1   carvedilol (COREG) 6.25 MG tablet Take 1 tablet (6.25 mg total) by mouth 2 (two) times daily with a meal. 60 tablet 3   FARXIGA 10 MG TABS tablet Take 10 mg by mouth in the morning.  furosemide (LASIX) 20 MG tablet Take 20 mg by mouth daily as needed for edema.     hydrochlorothiazide (MICROZIDE) 12.5 MG capsule Take 12.5 mg by mouth every morning.     isosorbide dinitrate (ISORDIL) 5 MG tablet Take 1 tablet by mouth twice daily 180 tablet 1   losartan (COZAAR) 25 MG tablet Take 25 mg by mouth at bedtime.     Magnesium 400 MG TABS Take 400 mg by mouth 2 (two) times a week.     Multiple  Vitamins-Minerals (MULTIVITAMIN WITH MINERALS) tablet Take 1 tablet by mouth daily.     Probiotic Product (PROBIOTIC PO) Take 1 capsule by mouth 2 (two) times a week.     rosuvastatin (CRESTOR) 40 MG tablet Take 1 tablet by mouth once daily 90 tablet 1   SYNTHROID 100 MCG tablet Take 1 tablet (100 mcg total) by mouth daily before breakfast. 90 tablet 3   Vitamin D3 (VITAMIN D) 25 MCG tablet Take 1,000 Units by mouth 2 (two) times a week.     zinc gluconate 50 MG tablet Take 50 mg by mouth 2 (two) times a week.     No current facility-administered medications for this visit.    Allergies:   Labetalol and Codeine   Social History:  The patient  reports that she quit smoking about 31 years ago. Her smoking use included cigarettes. She has a 15.00 pack-year smoking history. She has never been exposed to tobacco smoke. She has never used smokeless tobacco. She reports current alcohol use. She reports that she does not use drugs.   Family History:  The patient's family history includes Bone cancer in her sister; Diabetes in her father; Heart disease in her brother and mother; Melanoma in her brother; Stroke in her father; Sudden death in her brother and mother.   ROS:  Please see the history of present illness.   Otherwise, review of systems is positive for none.   All other systems are reviewed and negative.   PHYSICAL EXAM: VS:  There were no vitals taken for this visit. , BMI There is no height or weight on file to calculate BMI. GEN: Well nourished, well developed, in no acute distress  HEENT: normal  Neck: no JVD, carotid bruits, or masses Cardiac: ***RRR; no murmurs, rubs, or gallops,no edema  Respiratory:  clear to auscultation bilaterally, normal work of breathing GI: soft, nontender, nondistended, + BS MS: no deformity or atrophy  Skin: warm and dry Neuro:  Strength and sensation are intact Psych: euthymic mood, full affect  EKG:  EKG {ACTION; IS/IS [id number]} ordered  today. Personal review of the ekg ordered *** shows ***   Recent Labs: 03/24/2022: B Natriuretic Peptide 161.2; Magnesium 2.6 09/17/2022: ALT 15; BUN 18; Creat 1.58; Hemoglobin 15.7; Platelets 210; Potassium 4.2; Sodium 144 10/22/2022: TSH 4.15    Lipid Panel     Component Value Date/Time   CHOL 141 05/09/2022 1544   TRIG 116 05/09/2022 1544   HDL 42 05/09/2022 1544   CHOLHDL 3.4 05/09/2022 1544   CHOLHDL 3.4 09/22/2019 0226   VLDL 19 09/22/2019 0226   LDLCALC 78 05/09/2022 1544     Wt Readings from Last 3 Encounters:  11/09/22 213 lb (96.6 kg)  10/22/22 212 lb (96.2 kg)  09/14/22 210 lb (95.3 kg)      Other studies Reviewed: Additional studies/ records that were reviewed today include: TTE 04/24/21  Review of the above records today demonstrates:   1. Left ventricular ejection fraction, by  estimation, is 55 to 60%. The  left ventricle has normal function. The left ventricle has no regional  wall motion abnormalities. There is mild left ventricular hypertrophy.  Left ventricular diastolic parameters  are indeterminate.   2. Right ventricular systolic function is normal. The right ventricular  size is normal.   3. Left atrial size was mildly dilated.   4. The mitral valve is normal in structure. Mild mitral valve  regurgitation. No evidence of mitral stenosis.   5. The aortic valve is normal in structure. Aortic valve regurgitation is  mild. No aortic stenosis is present.   6. The inferior vena cava is normal in size with greater than 50%  respiratory variability, suggesting right atrial pressure of 3 mmHg.    ASSESSMENT AND PLAN:  1.  Persistent atrial fibrillation: Status post ablation 07/28/2021.  Currently on Eliquis 5 mg twice daily, amiodarone 200 mg daily.  CHA2DS2-VASc of at least 5.  Has plans for repeat ablation 12/14/2021.  2.  Coronary artery disease: No current chest pain.  Plan per primary cardiology.  3.  Obstructive sleep apnea: CPAP compliance  encouraged  4.  Second hypercoagulable state: Currently on Eliquis for atrial fibrillation as above  5.  High risk medication monitoring: Currently on amiodarone for atrial fibrillation as above.   Current medicines are reviewed at length with the patient today.   The patient does not have concerns regarding her medicines.  The following changes were made today: ***  Labs/ tests ordered today include:  No orders of the defined types were placed in this encounter.      Disposition:   FU *** months  Signed, Jamayia Croker Meredith Leeds, MD  11/29/2022 8:48 AM     [facility] [address] [place] [address] [phone] (office) [phone] (fax)

## 2022-11-30 ENCOUNTER — Ambulatory Visit: Payer: Medicare Other | Attending: Cardiology | Admitting: Cardiology

## 2022-11-30 ENCOUNTER — Encounter: Payer: Self-pay | Admitting: Cardiology

## 2022-11-30 VITALS — BP 160/94 | HR 62 | Ht 65.0 in | Wt 213.0 lb

## 2022-11-30 DIAGNOSIS — I4819 Other persistent atrial fibrillation: Secondary | ICD-10-CM | POA: Diagnosis not present

## 2022-11-30 DIAGNOSIS — Z79899 Other long term (current) drug therapy: Secondary | ICD-10-CM | POA: Diagnosis not present

## 2022-11-30 DIAGNOSIS — D6869 Other thrombophilia: Secondary | ICD-10-CM

## 2022-11-30 NOTE — Progress Notes (Signed)
Electrophysiology Office Note   Date:  11/30/2022   ID:  Claudia Cantrell, DOB [birth year], MRN [id number]  PCP:  Berneta Levins, DO  Cardiologist:  Claiborne Billings Primary Electrophysiologist:  Prisha Hiley Meredith Leeds, MD    Chief Complaint: atrial fibrillation   History of Present Illness: Claudia Cantrell is a 76 y.o. female who is being seen today for the evaluation of atrial fibrillation at the request of Combs, Diona Browner, *. Presenting today for electrophysiology evaluation.  He has a history seen for coronary artery disease, CKD, hypertension, hyperlipidemia, nonischemic cardiomyopathy, atrial fibrillation.  She is post ablation 07/28/2021.  She is unfortunate gone back into atrial fibrillation.  She is scheduled for repeat ablation 12/14/2021.  Today, denies symptoms of palpitations, chest pain, shortness of breath, orthopnea, PND, lower extremity edema, claudication, dizziness, presyncope, syncope, bleeding, or neurologic sequela. The patient is tolerating medications without difficulties.    Past Medical History:  Diagnosis Date   Arthritis    rheumatoid   CAD (coronary artery disease)    a. 1992 s/p MI and PTCA of unknown vessel;  b. 09/2010 Cath: LM nl, LAD 20p, LCX 71m RCA 126mRPL 20.   [place]   Chronic kidney disease    Family history of anesthesia complication    daughter has difficulty waking    GERD (gastroesophageal reflux disease)    occ   Gout 10/08/2016   Hyperlipidemia    Hypertensive heart disease    Hypothyroidism    Nonischemic cardiomyopathy ([place]   a. 07/2016 Echo: EF 40-45%, mild LVH, inferior akinesis, moderately dilated left atrium, trivial AI and MR.   PAF (paroxysmal atrial fibrillation) ([place]   a. 07/2016 Admitted w/ AF RVR-->CHA2DS2VASc = 5-->Eliquis;  b. 07/2016 successful TEE/DCCV.   Sleep apnea    a. Using CPAP.   Past Surgical History:  Procedure Laterality Date   ABDOMINAL HYSTERECTOMY  1988   ATRIAL FIBRILLATION  ABLATION N/A 07/28/2021   Procedure: ATRIAL FIBRILLATION ABLATION;  Surgeon: CaConstance HawMD;  [facility];  Service: Cardiovascular;  Laterality: N/A;   [place]  09/02/2019   Procedure: BIOPSY;  Surgeon: MaRush LandmarkaTelford Nab MD;  [facility];  Service: Gastroenterology;;   BIOPSY  12/09/2019   Procedure: BIOPSY;  Surgeon: MaIrving Copas MD;  [facility];  Service: Gastroenterology;;   BIOPSY  03/08/2021   Procedure: BIOPSY;  Surgeon: MaIrving Copas MD;  [facility];  Service: Gastroenterology;;   [place] 08/01/2016   Procedure: CARDIOVERSION;  Surgeon: BrLelon PerlaMD;  [facility];  Service: Cardiovascular;  Laterality: N/A;   CARDIOVERSION N/A 01/11/2020   Procedure: CARDIOVERSION;  Surgeon: NiJosue HectorMD;  [facility];  Service: Cardiovascular;  Laterality: N/A;   CARDIOVERSION N/A 07/08/2020   Procedure: CARDIOVERSION;  Surgeon: ScDonato HeinzMD;  [facility];  Service: Cardiovascular;  Laterality: N/A;   CARDIOVERSION N/A 05/31/2022   Procedure: CARDIOVERSION;  Surgeon: HiPixie CasinoMD;  [place] [facility]: Cardi[facility];  Laterality: N/A;   CARDIOVERSION N/A 07/18/2022   Procedure: CARDIOVERSION;  Surgeon: ScDonato HeinzMD;  [place]: Cardiovascular;  Laterality: N/A;   ENDOSCOPIC MUCOSAL RESECTION N/A 12/09/2019   Procedure: ENDOSCOPIC MUCOSAL RESECTION;  Surgeon: MaIrving Copas MD;  [facility];  Service: Gastroenterology;  Laterality: N/A;   ESOPHAGOGASTRODUODENOSCOPY N/A 03/08/2021   Procedure: ESOPHAGOGASTRODUODENOSCOPY (EGD);  Surgeon: Irving Copas., MD;  [facility];  Service: Gastroenterology;  Laterality: N/A;   ESOPHAGOGASTRODUODENOSCOPY (EGD) WITH PROPOFOL  N/A 09/02/2019   Procedure: ESOPHAGOGASTRODUODENOSCOPY (EGD) WITH PROPOFOL;  Surgeon: Rush Landmark Telford Nab., MD;  [facility];  Service: Gastroenterology;  Laterality: N/A;   ESOPHAGOGASTRODUODENOSCOPY (EGD) WITH PROPOFOL N/A 12/09/2019   Procedure: ESOPHAGOGASTRODUODENOSCOPY (EGD) WITH PROPOFOL;  Surgeon: Rush Landmark Telford Nab., MD;  [facility];  Service: Gastroenterology;  Laterality: N/A;   EUS N/A 09/02/2019   Procedure: UPPER ENDOSCOPIC ULTRASOUND (EUS) RADIAL;  Surgeon: Irving Copas., MD;  [facility];  Service: Gastroenterology;  Laterality: N/A;  EUS radial/linear   EUS N/A 12/09/2019   Procedure: UPPER ENDOSCOPIC ULTRASOUND (EUS) RADIAL;  Surgeon: Irving Copas., MD;  [facility];  Service: Gastroenterology;  Laterality: N/A;   EUS  12/09/2019   Procedure: UPPER ENDOSCOPIC ULTRASOUND (EUS) LINEAR;  Surgeon: Irving Copas., MD;  [facility];  Service: Gastroenterology;;   EUS N/A 03/08/2021   Procedure: UPPER ENDOSCOPIC ULTRASOUND (EUS) RADIAL;  Surgeon: Irving Copas., MD;  [facility];  Service: Gastroenterology;  Laterality: N/A;   FINE NEEDLE ASPIRATION  09/02/2019   Procedure: FINE NEEDLE ASPIRATION (FNA) LINEAR;  Surgeon: Irving Copas., MD;  [facility];  Service: Gastroenterology;;   FINE NEEDLE ASPIRATION  12/09/2019   Procedure: FINE NEEDLE ASPIRATION (FNA) LINEAR;  Surgeon: Irving Copas., MD;  [facility];  Service: Gastroenterology;;   HEMOSTASIS CLIP PLACEMENT  12/09/2019   Procedure: HEMOSTASIS CLIP PLACEMENT;  Surgeon: Irving Copas., MD;  [facility];  Service: Gastroenterology;;   ORIF ANKLE FRACTURE Right 04/27/2015   Procedure: OPEN REDUCTION INTERNAL FIXATION (ORIF) RIGHT BIMALLEOLAR ANKLE FRACTURE WITH SYNDESMOSIS FIXATION;  Surgeon: Leandrew Koyanagi, MD;  Location: [place];  Service: Orthopedics;  Laterality:  Right;   SUBMUCOSAL LIFTING INJECTION  12/09/2019   Procedure: SUBMUCOSAL LIFTING INJECTION;  Surgeon: Irving Copas., MD;  [facility];  Service: Gastroenterology;;   TEE WITHOUT CARDIOVERSION N/A 08/01/2016   Procedure: TRANSESOPHAGEAL ECHOCARDIOGRAM (TEE);  Surgeon: Lelon Perla, MD;  [facility];  Service: Cardiovascular;  Laterality: N/A;   THYROIDECTOMY  11/24/2013   DR Dalbert Batman   THYROIDECTOMY N/A 11/24/2013   Procedure: TOTAL THYROIDECTOMY;  Surgeon: Adin Hector, MD;  Location: [place];  Service: General;  Laterality: N/A;   TRANSTHORACIC ECHOCARDIOGRAM  01/15/2013   EF 55% TO 65%. PROBABLE MILD HYPOKINESIS OF THE INFERIOR MYOCARDIUM. GRADE 1 DIASTOLIC DYSFUNCTION. TRIAL AR.LA IS MILDLY DILATED.     Current Outpatient Medications  Medication Sig Dispense Refill   acetaminophen (TYLENOL) 500 MG tablet Take 1,000 mg by mouth every 6 (six) hours as needed for moderate pain.     allopurinol (ZYLOPRIM) 100 MG tablet Take 1 tablet (100 mg total) by mouth daily. 30 tablet 2   amLODipine (NORVASC) 5 MG tablet TAKE 1 & 1/2 (ONE & ONE-HALF) TABLETS BY MOUTH IN THE EVENING 135 tablet 1   apixaban (ELIQUIS) 5 MG TABS tablet Take 1 tablet by mouth twice daily 180 tablet 1   FARXIGA 10 MG TABS tablet Take 10 mg by mouth in the morning.     furosemide (LASIX) 20 MG tablet Take 20 mg by mouth daily as needed for edema.     hydrochlorothiazide (MICROZIDE) 12.5 MG capsule Take 12.5 mg by mouth every morning.     isosorbide dinitrate (ISORDIL) 5 MG  tablet Take 1 tablet by mouth twice daily 180 tablet 1   losartan (COZAAR) 25 MG tablet Take 25 mg by mouth at bedtime.     Magnesium 400 MG TABS Take 400 mg by mouth 2 (two) times a week.     Multiple Vitamins-Minerals (MULTIVITAMIN WITH MINERALS) tablet Take 1 tablet by mouth daily.     ofloxacin (OCUFLOX) 0.3 % ophthalmic solution      prednisoLONE acetate (PRED FORTE) 1 % ophthalmic suspension SMARTSIG:In Eye(s)      Probiotic Product (PROBIOTIC PO) Take 1 capsule by mouth 2 (two) times a week.     rosuvastatin (CRESTOR) 40 MG tablet Take 1 tablet by mouth once daily 90 tablet 1   SYNTHROID 100 MCG tablet Take 1 tablet (100 mcg total) by mouth daily before breakfast. 90 tablet 3   Vitamin D3 (VITAMIN D) 25 MCG tablet Take 1,000 Units by mouth 2 (two) times a week.     zinc gluconate 50 MG tablet Take 50 mg by mouth 2 (two) times a week.     amiodarone (PACERONE) 200 MG tablet Take 1 tablet (200 mg total) by mouth daily. 30 tablet 3   carvedilol (COREG) 6.25 MG tablet Take 1 tablet (6.25 mg total) by mouth 2 (two) times daily with a meal. 60 tablet 3   No current facility-administered medications for this visit.    Allergies:   Labetalol and Codeine   Social History:  The patient  reports that she quit smoking about 31 years ago. Her smoking use included cigarettes. She has a 15.00 pack-year smoking history. She has never been exposed to tobacco smoke. She has never used smokeless tobacco. She reports current alcohol use. She reports that she does not use drugs.   Family History:  The patient's family history includes Bone cancer in her sister; Diabetes in her father; Heart disease in her brother and mother; Melanoma in her brother; Stroke in her father; Sudden death in her brother and mother.   ROS:  Please see the history of present illness.   Otherwise, review of systems is positive for none.   All other systems are reviewed and negative.   PHYSICAL EXAM: VS:  BP (!) 160/94   Pulse 62   Ht '5\' 5"'$  (1.651 m)   Wt 213 lb (96.6 kg)   SpO2 97%   BMI 35.45 kg/m  , BMI Body mass index is 35.45 kg/m. GEN: Well nourished, well developed, in no acute distress  HEENT: normal  Neck: no JVD, carotid bruits, or masses Cardiac: RRR; no murmurs, rubs, or gallops,no edema  Respiratory:  clear to auscultation bilaterally, normal work of breathing GI: soft, nontender, nondistended, + BS MS: no deformity or  atrophy  Skin: warm and dry Neuro:  Strength and sensation are intact Psych: euthymic mood, full affect  EKG:  EKG is ordered today. Personal review of the ekg ordered shows sinus rhythm   Recent Labs: 03/24/2022: B Natriuretic Peptide 161.2; Magnesium 2.6 09/17/2022: ALT 15; BUN 18; Creat 1.58; Hemoglobin 15.7; Platelets 210; Potassium 4.2; Sodium 144 10/22/2022: TSH 4.15    Lipid Panel     Component Value Date/Time   CHOL 141 05/09/2022 1544   TRIG 116 05/09/2022 1544   HDL 42 05/09/2022 1544   CHOLHDL 3.4 05/09/2022 1544   CHOLHDL 3.4 09/22/2019 0226   VLDL 19 09/22/2019 0226   LDLCALC 78 05/09/2022 1544     Wt Readings from Last 3 Encounters:  11/30/22 213 lb (96.6 kg)  11/09/22 213 lb (96.6 kg)  10/22/22 212 lb (96.2 kg)      Other studies Reviewed: Additional studies/ records that were reviewed today include: TTE 04/24/21  Review of the above records today demonstrates:   1. Left ventricular ejection fraction, by estimation, is 55 to 60%. The  left ventricle has normal function. The left ventricle has no regional  wall motion abnormalities. There is mild left ventricular hypertrophy.  Left ventricular diastolic parameters  are indeterminate.   2. Right ventricular systolic function is normal. The right ventricular  size is normal.   3. Left atrial size was mildly dilated.   4. The mitral valve is normal in structure. Mild mitral valve  regurgitation. No evidence of mitral stenosis.   5. The aortic valve is normal in structure. Aortic valve regurgitation is  mild. No aortic stenosis is present.   6. The inferior vena cava is normal in size with greater than 50%  respiratory variability, suggesting right atrial pressure of 3 mmHg.    ASSESSMENT AND PLAN:  1.  Persistent atrial fibrillation: Status post ablation 07/28/2021.  Currently on Eliquis 5 mg twice daily, amiodarone 200 mg daily.  CHA2DS2-VASc of at least 5.  Has plans for repeat ablation 12/14/2021.  2.   Coronary artery disease: No current chest pain.  Plan per primary cardiology.  3.  Obstructive sleep apnea: CPAP compliance encouraged  4.  Second hypercoagulable state: Currently on Eliquis for atrial fibrillation as above  5.  High risk medication monitoring: Currently on amiodarone for atrial fibrillation as above.   Current medicines are reviewed at length with the patient today.   The patient does not have concerns regarding her medicines.  The following changes were made today: none  Labs/ tests ordered today include:  Orders Placed This Encounter  Procedures   EKG 12-Lead       Disposition:   FU 3 months  Signed, Lakedra Washington Meredith Leeds, MD  11/30/2022 4:41 PM     [address] [place] [id number] [phone] (office) [phone] (fax)

## 2022-11-30 NOTE — H&P (View-Only) (Signed)
Electrophysiology Office Note   Date:  11/30/2022   ID:  Claudia Cantrell, DOB 10-23-1946, MRN [id number]  PCP:  Berneta Levins, DO  Cardiologist:  Claiborne Billings Primary Electrophysiologist:  Jeanean Hollett Meredith Leeds, MD    Chief Complaint: atrial fibrillation   History of Present Illness: Claudia Cantrell is a 76 y.o. female who is being seen today for the evaluation of atrial fibrillation at the request of Combs, Diona Browner, *. Presenting today for electrophysiology evaluation.  He has a history seen for coronary artery disease, CKD, hypertension, hyperlipidemia, nonischemic cardiomyopathy, atrial fibrillation.  She is post ablation 07/28/2021.  She is unfortunate gone back into atrial fibrillation.  She is scheduled for repeat ablation 12/14/2021.  Today, denies symptoms of palpitations, chest pain, shortness of breath, orthopnea, PND, lower extremity edema, claudication, dizziness, presyncope, syncope, bleeding, or neurologic sequela. The patient is tolerating medications without difficulties.    Past Medical History:  Diagnosis Date   Arthritis    rheumatoid   CAD (coronary artery disease)    a. 1992 s/p MI and PTCA of unknown vessel;  b. 09/2010 Cath: LM nl, LAD 20p, LCX 51m RCA 15mRPL 20.   [place] [facility] [place]   Chronic kidney disease    Family history of anesthesia complication    daughter has difficulty waking    GERD (gastroesophageal reflux disease)    occ   Gout 10/08/2016   Hyperlipidemia    Hypertensive heart disease    Hypothyroidism    Nonischemic cardiomyopathy ([facility] [place]   a. 07/2016 Echo: EF 40-45%, mild LVH, inferior akinesis, moderately dilated left atrium, trivial AI and MR.   PAF (paroxysmal atrial fibrillation) ([place]   a. 07/2016 Admitted w/ AF RVR-->CHA2DS2VASc = 5-->Eliquis;  b. 07/2016 successful TEE/DCCV.   Sleep apnea    a. Using CPAP.   Past Surgical History:  Procedure Laterality Date   ABDOMINAL HYSTERECTOMY  1988   ATRIAL FIBRILLATION  ABLATION N/A 07/28/2021   Procedure: ATRIAL FIBRILLATION ABLATION;  Surgeon: CaConstance HawMD;  [facility];  Service: Cardiovascular;  Laterality: N/A;   BASan Patricio BIOPSY  09/02/2019   Procedure: BIOPSY;  Surgeon: MaRush LandmarkaTelford Nab MD;  [facility];  Service: Gastroenterology;;   BIOPSY  12/09/2019   Procedure: BIOPSY;  Surgeon: MaIrving Copas MD;  [facility];  Service: Gastroenterology;;   BIOPSY  03/08/2021   Procedure: BIOPSY;  Surgeon: MaIrving Copas MD;  [facility];  Service: Gastroenterology;;   C[place] PTCA [place] DR [place]/A 08/01/2016   Procedure: CARDIOVERSION;  Surgeon: BrLelon PerlaMD;  [facility];  Service: Cardiovascular;  Laterality: N/A;   CARDIOVERSION N/A 01/11/2020   Procedure: CARDIOVERSION;  Surgeon: NiJosue HectorMD;  [facility];  Service: Cardiovascular;  Laterality: N/A;   CARDIOVERSION N/A 07/08/2020   Procedure: CARDIOVERSION;  Surgeon: ScDonato HeinzMD;  [facility];  Service: Cardiovascular;  Laterality: N/A;   CARDIOVERSION N/A 05/31/2022   Procedure: CARDIOVERSION;  Surgeon: HiPixie CasinoMD;  [facility] Service: Cardiovascular;  Laterality: N/A;   CARDIOVERSION N/A 07/18/2022   Procedure: CARDIOVERSION;  Surgeon: ScDonato HeinzMD;  [facility] Service: Cardiovascular;  Laterality: N/A;   ENDOSCOPIC MUCOSAL RESECTION N/A 12/09/2019   Procedure: ENDOSCOPIC MUCOSAL RESECTION;  Surgeon: MaIrving Copas MD;  [facility];  Service: Gastroenterology;  Laterality: N/A;   ESOPHAGOGASTRODUODENOSCOPY N/A 03/08/2021   Procedure: ESOPHAGOGASTRODUODENOSCOPY (EGD);  Surgeon: Irving Copas., MD;  [facility];  Service: Gastroenterology;  Laterality: N/A;   ESOPHAGOGASTRODUODENOSCOPY (EGD) WITH PROPOFOL  N/A 09/02/2019   Procedure: ESOPHAGOGASTRODUODENOSCOPY (EGD) WITH PROPOFOL;  Surgeon: Rush Landmark Telford Nab., MD;  [facility];  Service: Gastroenterology;  Laterality: N/A;   ESOPHAGOGASTRODUODENOSCOPY (EGD) WITH PROPOFOL N/A 12/09/2019   Procedure: ESOPHAGOGASTRODUODENOSCOPY (EGD) WITH PROPOFOL;  Surgeon: Rush Landmark Telford Nab., MD;  [facility];  Service: Gastroenterology;  Laterality: N/A;   EUS N/A 09/02/2019   Procedure: UPPER ENDOSCOPIC ULTRASOUND (EUS) RADIAL;  Surgeon: Irving Copas., MD;  [facility];  Service: Gastroenterology;  Laterality: N/A;  EUS radial/linear   EUS N/A 12/09/2019   Procedure: UPPER ENDOSCOPIC ULTRASOUND (EUS) RADIAL;  Surgeon: Irving Copas., MD;  [facility];  Service: Gastroenterology;  Laterality: N/A;   EUS  12/09/2019   Procedure: UPPER ENDOSCOPIC ULTRASOUND (EUS) LINEAR;  Surgeon: Irving Copas., MD;  [facility];  Service: Gastroenterology;;   EUS N/A 03/08/2021   Procedure: UPPER ENDOSCOPIC ULTRASOUND (EUS) RADIAL;  Surgeon: Irving Copas., MD;  [facility];  Service: Gastroenterology;  Laterality: N/A;   FINE NEEDLE ASPIRATION  09/02/2019   Procedure: FINE NEEDLE ASPIRATION (FNA) LINEAR;  Surgeon: Irving Copas., MD;  [facility];  Service: Gastroenterology;;   FINE NEEDLE ASPIRATION  12/09/2019   Procedure: FINE NEEDLE ASPIRATION (FNA) LINEAR;  Surgeon: Irving Copas., MD;  [facility];  Service: Gastroenterology;;   HEMOSTASIS CLIP PLACEMENT  12/09/2019   Procedure: HEMOSTASIS CLIP PLACEMENT;  Surgeon: Irving Copas., MD;  [facility];  Service: Gastroenterology;;   ORIF ANKLE FRACTURE Right 04/27/2015   Procedure: OPEN REDUCTION INTERNAL FIXATION (ORIF) RIGHT BIMALLEOLAR ANKLE FRACTURE WITH SYNDESMOSIS FIXATION;  Surgeon: Leandrew Koyanagi, MD;  Location: [place];  Service: Orthopedics;  Laterality:  Right;   SUBMUCOSAL LIFTING INJECTION  12/09/2019   Procedure: SUBMUCOSAL LIFTING INJECTION;  Surgeon: Irving Copas., MD;  [facility];  Service: Gastroenterology;;   TEE WITHOUT CARDIOVERSION N/A 08/01/2016   Procedure: TRANSESOPHAGEAL ECHOCARDIOGRAM (TEE);  Surgeon: Lelon Perla, MD;  [facility];  Service: Cardiovascular;  Laterality: N/A;   THYROIDECTOMY  11/24/2013   DR Dalbert Batman   THYROIDECTOMY N/A 11/24/2013   Procedure: TOTAL THYROIDECTOMY;  Surgeon: Adin Hector, MD;  Location: [place];  Service: General;  Laterality: N/A;   TRANSTHORACIC ECHOCARDIOGRAM  01/15/2013   EF 55% TO 65%. PROBABLE MILD HYPOKINESIS OF THE INFERIOR MYOCARDIUM. GRADE 1 DIASTOLIC DYSFUNCTION. TRIAL AR.LA IS MILDLY DILATED.     Current Outpatient Medications  Medication Sig Dispense Refill   acetaminophen (TYLENOL) 500 MG tablet Take 1,000 mg by mouth every 6 (six) hours as needed for moderate pain.     allopurinol (ZYLOPRIM) 100 MG tablet Take 1 tablet (100 mg total) by mouth daily. 30 tablet 2   amLODipine (NORVASC) 5 MG tablet TAKE 1 & 1/2 (ONE & ONE-HALF) TABLETS BY MOUTH IN THE EVENING 135 tablet 1   apixaban (ELIQUIS) 5 MG TABS tablet Take 1 tablet by mouth twice daily 180 tablet 1   FARXIGA 10 MG TABS tablet Take 10 mg by mouth in the morning.     furosemide (LASIX) 20 MG tablet Take 20 mg by mouth daily as needed for edema.     hydrochlorothiazide (MICROZIDE) 12.5 MG capsule Take 12.5 mg by mouth every morning.     isosorbide dinitrate (ISORDIL) 5 MG  tablet Take 1 tablet by mouth twice daily 180 tablet 1   losartan (COZAAR) 25 MG tablet Take 25 mg by mouth at bedtime.     Magnesium 400 MG TABS Take 400 mg by mouth 2 (two) times a week.     Multiple Vitamins-Minerals (MULTIVITAMIN WITH MINERALS) tablet Take 1 tablet by mouth daily.     ofloxacin (OCUFLOX) 0.3 % ophthalmic solution      prednisoLONE acetate (PRED FORTE) 1 % ophthalmic suspension SMARTSIG:In Eye(s)      Probiotic Product (PROBIOTIC PO) Take 1 capsule by mouth 2 (two) times a week.     rosuvastatin (CRESTOR) 40 MG tablet Take 1 tablet by mouth once daily 90 tablet 1   SYNTHROID 100 MCG tablet Take 1 tablet (100 mcg total) by mouth daily before breakfast. 90 tablet 3   Vitamin D3 (VITAMIN D) 25 MCG tablet Take 1,000 Units by mouth 2 (two) times a week.     zinc gluconate 50 MG tablet Take 50 mg by mouth 2 (two) times a week.     amiodarone (PACERONE) 200 MG tablet Take 1 tablet (200 mg total) by mouth daily. 30 tablet 3   carvedilol (COREG) 6.25 MG tablet Take 1 tablet (6.25 mg total) by mouth 2 (two) times daily with a meal. 60 tablet 3   No current facility-administered medications for this visit.    Allergies:   Labetalol and Codeine   Social History:  The patient  reports that she quit smoking about 31 years ago. Her smoking use included cigarettes. She has a 15.00 pack-year smoking history. She has never been exposed to tobacco smoke. She has never used smokeless tobacco. She reports current alcohol use. She reports that she does not use drugs.   Family History:  The patient's family history includes Bone cancer in her sister; Diabetes in her father; Heart disease in her brother and mother; Melanoma in her brother; Stroke in her father; Sudden death in her brother and mother.   ROS:  Please see the history of present illness.   Otherwise, review of systems is positive for none.   All other systems are reviewed and negative.   PHYSICAL EXAM: VS:  BP (!) 160/94   Pulse 62   Ht '5\' 5"'$  (1.651 m)   Wt 213 lb (96.6 kg)   SpO2 97%   BMI 35.45 kg/m  , BMI Body mass index is 35.45 kg/m. GEN: Well nourished, well developed, in no acute distress  HEENT: normal  Neck: no JVD, carotid bruits, or masses Cardiac: RRR; no murmurs, rubs, or gallops,no edema  Respiratory:  clear to auscultation bilaterally, normal work of breathing GI: soft, nontender, nondistended, + BS MS: no deformity or  atrophy  Skin: warm and dry Neuro:  Strength and sensation are intact Psych: euthymic mood, full affect  EKG:  EKG is ordered today. Personal review of the ekg ordered shows sinus rhythm   Recent Labs: 03/24/2022: B Natriuretic Peptide 161.2; Magnesium 2.6 09/17/2022: ALT 15; BUN 18; Creat 1.58; Hemoglobin 15.7; Platelets 210; Potassium 4.2; Sodium 144 10/22/2022: TSH 4.15    Lipid Panel     Component Value Date/Time   CHOL 141 05/09/2022 1544   TRIG 116 05/09/2022 1544   HDL 42 05/09/2022 1544   CHOLHDL 3.4 05/09/2022 1544   CHOLHDL 3.4 09/22/2019 0226   VLDL 19 09/22/2019 0226   LDLCALC 78 05/09/2022 1544     Wt Readings from Last 3 Encounters:  11/30/22 213 lb (96.6 kg)  11/09/22 213 lb (96.6 kg)  10/22/22 212 lb (96.2 kg)      Other studies Reviewed: Additional studies/ records that were reviewed today include: TTE 04/24/21  Review of the above records today demonstrates:   1. Left ventricular ejection fraction, by estimation, is 55 to 60%. The  left ventricle has normal function. The left ventricle has no regional  wall motion abnormalities. There is mild left ventricular hypertrophy.  Left ventricular diastolic parameters  are indeterminate.   2. Right ventricular systolic function is normal. The right ventricular  size is normal.   3. Left atrial size was mildly dilated.   4. The mitral valve is normal in structure. Mild mitral valve  regurgitation. No evidence of mitral stenosis.   5. The aortic valve is normal in structure. Aortic valve regurgitation is  mild. No aortic stenosis is present.   6. The inferior vena cava is normal in size with greater than 50%  respiratory variability, suggesting right atrial pressure of 3 mmHg.    ASSESSMENT AND PLAN:  1.  Persistent atrial fibrillation: Status post ablation 07/28/2021.  Currently on Eliquis 5 mg twice daily, amiodarone 200 mg daily.  CHA2DS2-VASc of at least 5.  Has plans for repeat ablation 12/14/2021.  2.   Coronary artery disease: No current chest pain.  Plan per primary cardiology.  3.  Obstructive sleep apnea: CPAP compliance encouraged  4.  Second hypercoagulable state: Currently on Eliquis for atrial fibrillation as above  5.  High risk medication monitoring: Currently on amiodarone for atrial fibrillation as above.   Current medicines are reviewed at length with the patient today.   The patient does not have concerns regarding her medicines.  The following changes were made today: none  Labs/ tests ordered today include:  Orders Placed This Encounter  Procedures   EKG 12-Lead       Disposition:   FU 3 months  Signed, Ahmad Vanwey Meredith Leeds, MD  11/30/2022 4:41 PM     [place] [address] [place] [address] [phone] (office) [phone] (fax)

## 2022-12-07 ENCOUNTER — Telehealth: Payer: Self-pay

## 2022-12-07 NOTE — Telephone Encounter (Signed)
Called pt to get lab results for her upcoming Ablation procedure. She will call her Rheumatologist to have them faxed to [place].

## 2022-12-10 HISTORY — PX: CATARACT EXTRACTION: SUR2

## 2022-12-11 ENCOUNTER — Other Ambulatory Visit: Payer: Self-pay

## 2022-12-11 DIAGNOSIS — Z79899 Other long term (current) drug therapy: Secondary | ICD-10-CM

## 2022-12-11 DIAGNOSIS — M1A09X0 Idiopathic chronic gout, multiple sites, without tophus (tophi): Secondary | ICD-10-CM

## 2022-12-11 NOTE — Telephone Encounter (Signed)
Pt called letting us know she had labwork done today and the results will be in Epic.

## 2022-12-11 NOTE — Progress Notes (Signed)
Office Visit Note  Patient: Claudia Cantrell             Date of Birth: 1946-12-01           MRN: [id number]             PCP: Avanell Shackleton, NP-C Referring: Theodis Shove, * Visit Date: 12/12/2022 Occupation: @GUAROCC @  Subjective:  Medication monitoring   History of Present Illness: Claudia Cantrell is a 77 y.o. female with history of gout.  Patient restarted on allopurinol after her last office visit.  She has been taking allopurinol 100 mg daily and has been tolerating it without any side effects.  She has not missed any doses of allopurinol recently.  She denies any signs or symptoms of a gout flare.  She has been experiencing some increased discomfort in her right shoulder joint.  She previously had a right shoulder joint cortisone injection on 04/15/2020 which provided significant relief but her symptoms have returned.  She has not been experiencing any nocturnal pain but has stiffness in the right shoulder lasting all day.  If her symptoms persist or worsen she would like to return for a right shoulder joint cortisone injection in the future.  She denies any discomfort in the left shoulder at this time.  She denies any other joint pain or joint swelling currently. Patient reports that she is scheduled for an ablation on 12/14/2022 for persistent A-fib.    Activities of Daily Living:  Patient reports morning stiffness for all day in right shoulder. Patient Denies nocturnal pain.  Difficulty dressing/grooming: Denies Difficulty climbing stairs: Denies Difficulty getting out of chair: Denies Difficulty using hands for taps, buttons, cutlery, and/or writing: Denies  Review of Systems  Constitutional:  Negative for fatigue.  HENT:  Negative for mouth sores, mouth dryness and nose dryness.   Eyes:  Negative for pain, visual disturbance and dryness.  Respiratory:  Negative for cough, hemoptysis, shortness of breath and difficulty breathing.   Cardiovascular:  Negative for chest pain,  palpitations, hypertension and swelling in legs/feet.  Gastrointestinal:  Negative for blood in stool, constipation and diarrhea.  Endocrine: Negative for increased urination.  Genitourinary:  Negative for painful urination and involuntary urination.  Musculoskeletal:  Positive for joint pain, joint pain and morning stiffness. Negative for gait problem, joint swelling, myalgias, muscle weakness, muscle tenderness and myalgias.  Skin:  Negative for color change, pallor, rash, hair loss, nodules/bumps, skin tightness, ulcers and sensitivity to sunlight.  Allergic/Immunologic: Negative for susceptible to infections.  Neurological:  Negative for dizziness, numbness, headaches and weakness.  Hematological:  Negative for swollen glands.  Psychiatric/Behavioral:  Negative for depressed mood and sleep disturbance. The patient is not nervous/anxious.     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/2022   Acquired hypothyroidism 10/30/2021   Stromal tumor determined by gastric biopsy 06/24/2020   Iatrogenic hyperthyroidism 05/02/2020   Dyslipidemia, goal LDL below 70 05/02/2020   Acute combined systolic and diastolic CHF, NYHA class 3 (HCC) 09/23/2019   Persistent atrial fibrillation (HCC) 09/21/2019   Submucosal lesion of stomach 07/19/2019   Abnormal CT of the abdomen 07/19/2019   Abnormal CT scan, stomach 06/17/2019   Advance directive declined by patient 01/14/2019   Chronic anticoagulation 01/14/2019   CRI (  chronic renal insufficiency), stage 2 (mild) 01/14/2019   Prediabetes 01/14/2019   Persistent proteinuria 02/17/2018   Hyperuricemia 03/26/2017   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   Vitamin D deficiency 03/26/2017    Medication monitoring encounter 03/26/2017   Gout 10/08/2016   Essential hypertension    CAD S/P percutaneous coronary angioplasty    Nonischemic cardiomyopathy (HCC)    Chest pain with moderate risk for cardiac etiology 08/02/2016   Closed right ankle fracture 04/24/2015   Ankle fracture, bimalleolar, closed 04/24/2015   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   OSA on CPAP 10/09/2013    Past Medical History:  Diagnosis Date   Arthritis    rheumatoid   CAD (coronary artery disease)    a. 1992 s/p MI and PTCA of unknown vessel;  b. 09/2010 Cath: LM nl, LAD 20p, LCX 91m, RCA 70m, RPL 20.   Cervical cancer (HCC) 1977   Chronic kidney disease    Family history of anesthesia complication    daughter has difficulty waking    GERD (gastroesophageal reflux disease)    occ   Gout 10/08/2016   Hyperlipidemia    Hypertensive heart disease    Hypothyroidism    Nonischemic cardiomyopathy (HCC)    a. 07/2016 Echo: EF 40-45%, mild LVH, inferior akinesis, moderately dilated left atrium, trivial AI and MR.   PAF (paroxysmal atrial fibrillation) (HCC)    a. 07/2016 Admitted w/ AF RVR-->CHA2DS2VASc = 5-->Eliquis;  b. 07/2016 successful TEE/DCCV.   Sleep apnea    a. Using CPAP.    Family History  Problem Relation Age of Onset   Heart disease Mother    Sudden death Mother    Diabetes Father    Stroke Father    Bone cancer Sister    Sudden death Brother    Melanoma Brother    Heart disease Brother    Colon cancer Neg Hx    Esophageal cancer Neg Hx    Inflammatory bowel disease Neg Hx    Liver disease Neg Hx    Pancreatic cancer Neg Hx    Rectal cancer Neg Hx    Stomach cancer Neg Hx    Past Surgical History:  Procedure Laterality Date   ABDOMINAL HYSTERECTOMY  1988   ATRIAL FIBRILLATION ABLATION N/A 07/28/2021   Procedure: ATRIAL FIBRILLATION ABLATION;  Surgeon: Regan Lemming, MD;  Location: [facility];  Service: Cardiovascular;  Laterality: N/A;   BACK SURGERY  1994    BIOPSY  09/02/2019   Procedure: BIOPSY;  Surgeon: Meridee Score Netty Starring., MD;  Location: [facility];  Service: Gastroenterology;;   BIOPSY  12/09/2019   Procedure: BIOPSY;  Surgeon: Lemar Lofty., MD;  Location: [facility];  Service: Gastroenterology;;   BIOPSY  03/08/2021   Procedure: BIOPSY;  Surgeon: Lemar Lofty., MD;  Location: [facility];  Service: Gastroenterology;;   CARDIAC CATHETERIZATION  1991 AND 1992   PTCA BY DR Meryl Crutch   CARDIOVERSION N/A 08/01/2016   Procedure: CARDIOVERSION;  Surgeon: Lewayne Bunting, MD;  Location: [facility] ENDOSCOPY;  Service: Cardiovascular;  Laterality: N/A;   CARDIOVERSION N/A 01/11/2020   Procedure: CARDIOVERSION;  Surgeon: Wendall Stade, MD;  Location: [facility] ENDOSCOPY;  Service: Cardiovascular;  Laterality: N/A;   CARDIOVERSION N/A 07/08/2020   Procedure: CARDIOVERSION;  Surgeon: Little Ishikawa, MD;  Location: [facility] ENDOSCOPY;  Service: Cardiovascular;  Laterality: N/A;   CARDIOVERSION N/A 05/31/2022   Procedure: CARDIOVERSION;  Surgeon: Chrystie Nose, MD;  Location: [facility] ENDOSCOPY;  Service: Cardiovascular;  Laterality:  N/A;   CARDIOVERSION N/A 07/18/2022   Procedure: CARDIOVERSION;  Surgeon: Little Ishikawa, MD;  Location: [facility] ENDOSCOPY;  Service: Cardiovascular;  Laterality: N/A;   ENDOSCOPIC MUCOSAL RESECTION N/A 12/09/2019   Procedure: ENDOSCOPIC MUCOSAL RESECTION;  Surgeon: Meridee Score Netty Starring., MD;  Location: [facility] ENDOSCOPY;  Service: Gastroenterology;  Laterality: N/A;   ESOPHAGOGASTRODUODENOSCOPY N/A 03/08/2021   Procedure: ESOPHAGOGASTRODUODENOSCOPY (EGD);  Surgeon: Lemar Lofty., MD;  Location: [facility];  Service: Gastroenterology;  Laterality: N/A;   ESOPHAGOGASTRODUODENOSCOPY (EGD) WITH PROPOFOL N/A 09/02/2019   Procedure: ESOPHAGOGASTRODUODENOSCOPY (EGD) WITH PROPOFOL;  Surgeon: Meridee Score Netty Starring., MD;  Location: [facility] ENDOSCOPY;  Service: Gastroenterology;  Laterality: N/A;    ESOPHAGOGASTRODUODENOSCOPY (EGD) WITH PROPOFOL N/A 12/09/2019   Procedure: ESOPHAGOGASTRODUODENOSCOPY (EGD) WITH PROPOFOL;  Surgeon: Meridee Score Netty Starring., MD;  Location: [facility] ENDOSCOPY;  Service: Gastroenterology;  Laterality: N/A;   EUS N/A 09/02/2019   Procedure: UPPER ENDOSCOPIC ULTRASOUND (EUS) RADIAL;  Surgeon: Lemar Lofty., MD;  Location: [facility] ENDOSCOPY;  Service: Gastroenterology;  Laterality: N/A;  EUS radial/linear   EUS N/A 12/09/2019   Procedure: UPPER ENDOSCOPIC ULTRASOUND (EUS) RADIAL;  Surgeon: Lemar Lofty., MD;  Location: [facility] ENDOSCOPY;  Service: Gastroenterology;  Laterality: N/A;   EUS  12/09/2019   Procedure: UPPER ENDOSCOPIC ULTRASOUND (EUS) LINEAR;  Surgeon: Lemar Lofty., MD;  Location: [facility];  Service: Gastroenterology;;   EUS N/A 03/08/2021   Procedure: UPPER ENDOSCOPIC ULTRASOUND (EUS) RADIAL;  Surgeon: Lemar Lofty., MD;  Location: [facility];  Service: Gastroenterology;  Laterality: N/A;   FINE NEEDLE ASPIRATION  09/02/2019   Procedure: FINE NEEDLE ASPIRATION (FNA) LINEAR;  Surgeon: Lemar Lofty., MD;  Location: [facility];  Service: Gastroenterology;;   FINE NEEDLE ASPIRATION  12/09/2019   Procedure: FINE NEEDLE ASPIRATION (FNA) LINEAR;  Surgeon: Lemar Lofty., MD;  Location: [facility];  Service: Gastroenterology;;   HEMOSTASIS CLIP PLACEMENT  12/09/2019   Procedure: HEMOSTASIS CLIP PLACEMENT;  Surgeon: Lemar Lofty., MD;  Location: [facility];  Service: Gastroenterology;;   ORIF ANKLE FRACTURE Right 04/27/2015   Procedure: OPEN REDUCTION INTERNAL FIXATION (ORIF) RIGHT BIMALLEOLAR ANKLE FRACTURE WITH SYNDESMOSIS FIXATION;  Surgeon: Tarry Kos, MD;  Location: [facility] OR;  Service: Orthopedics;  Laterality: Right;   SUBMUCOSAL LIFTING INJECTION  12/09/2019   Procedure: SUBMUCOSAL LIFTING INJECTION;  Surgeon: Lemar Lofty., MD;  Location: [facility] ENDOSCOPY;  Service: Gastroenterology;;    TEE WITHOUT CARDIOVERSION N/A 08/01/2016   Procedure: TRANSESOPHAGEAL ECHOCARDIOGRAM (TEE);  Surgeon: Lewayne Bunting, MD;  Location: [facility] ENDOSCOPY;  Service: Cardiovascular;  Laterality: N/A;   THYROIDECTOMY  11/24/2013   DR Derrell Lolling   THYROIDECTOMY N/A 11/24/2013   Procedure: TOTAL THYROIDECTOMY;  Surgeon: Ernestene Mention, MD;  Location: [facility] OR;  Service: General;  Laterality: N/A;   TRANSTHORACIC ECHOCARDIOGRAM  01/15/2013   EF 55% TO 65%. PROBABLE MILD HYPOKINESIS OF THE INFERIOR MYOCARDIUM. GRADE 1 DIASTOLIC DYSFUNCTION. TRIAL AR.LA IS MILDLY DILATED.   Social History   Social History Narrative   Right handed    Living  alone.   Immunization History  Administered Date(s) Administered   Fluad Quad(high Dose 65+) 08/22/2019, 09/20/2021   Influenza Nasal 11/25/2013   Influenza, High Dose Seasonal PF 12/30/2014, 10/14/2017, 11/01/2018   Influenza,inj,Quad PF,6+ Mos 11/25/2013   Influenza-Unspecified 12/30/2012, 11/09/2015, 12/24/2016, 09/16/2020   PFIZER(Purple Top)SARS-COV-2 Vaccination 12/29/2019, 01/18/2020, 09/16/2020   Pfizer Covid-19 Vaccine Bivalent Booster 5y-11y 10/21/2021   Pneumococcal Conjugate-13 12/30/2014, 10/14/2017   Pneumococcal Polysaccharide-23 11/25/2013, 08/22/2019   Zoster Recombinat (Shingrix) 10/14/2017, 01/21/2018  Zoster, Live 12/15/2008     Objective: Vital Signs: BP (!) 151/79 (BP Location: Left Arm, Patient Position: Sitting, Cuff Size: Large)   Pulse (!) 58   Resp 16   Ht 5\' 5"  (1.651 m)   Wt 216 lb 9.6 oz (98.2 kg)   BMI 36.04 kg/m    Physical Exam Vitals and nursing note reviewed.  Constitutional:      Appearance: She is well-developed.  HENT:     Head: Normocephalic and atraumatic.  Eyes:     Conjunctiva/sclera: Conjunctivae normal.  Cardiovascular:     Rate and Rhythm: Normal rate and regular rhythm.     Heart sounds: Normal heart sounds.  Pulmonary:     Effort: Pulmonary effort is normal.     Breath sounds: Normal breath sounds.   Abdominal:     General: Bowel sounds are normal.     Palpations: Abdomen is soft.  Musculoskeletal:     Cervical back: Normal range of motion.  Skin:    General: Skin is warm and dry.     Capillary Refill: Capillary refill takes less than 2 seconds.  Neurological:     Mental Status: She is alert and oriented to person, place, and time.  Psychiatric:        Behavior: Behavior normal.      Musculoskeletal Exam: C-spine has good range of motion with no discomfort.  Some postural thoracic kyphosis noted.  Some right trapezius muscle tension and tenderness.  Painful range of motion of the right shoulder joint with limited internal rotation.  Left shoulder has good range of motion with no warmth or tenderness.  Elbow joints, wrist joints, MCPs, PIPs, DIPs have good range of motion with no synovitis.  Complete fist formation bilaterally.  PIP and DIP thickening noted.  Hip joints have good range of motion with no groin pain.  Knee joints have good range of motion with no warmth or effusion.  Ankle joints have good range of motion with no tenderness or synovitis.  CDAI Exam: CDAI Score: -- Patient Global: --; Provider Global: -- Swollen: --; Tender: -- Joint Exam 12/12/2022   No joint exam has been documented for this visit   There is currently no information documented on the homunculus. Go to the Rheumatology activity and complete the homunculus joint exam.  Investigation: No additional findings.  Imaging: No results found.  Recent Labs: Lab Results  Component Value Date   WBC 3.7 (L) 12/11/2022   HGB 16.8 (H) 12/11/2022   PLT 213 12/11/2022   NA 147 (H) 12/11/2022   K 4.0 12/11/2022   CL 107 12/11/2022   CO2 29 12/11/2022   GLUCOSE 82 12/11/2022   BUN 13 12/11/2022   CREATININE 1.45 (H) 12/11/2022   BILITOT 0.7 12/11/2022   ALKPHOS 78 03/24/2022   AST 19 12/11/2022   ALT 18 12/11/2022   PROT 8.0 12/11/2022   ALBUMIN 4.6 03/24/2022   CALCIUM 9.7 12/11/2022   GFRAA 74  09/16/2020    Speciality Comments: No specialty comments available.  Procedures:  No procedures performed Allergies: Labetalol and Codeine     Assessment / Plan:     Visit Diagnoses: Idiopathic chronic gout of multiple sites without tophus - Allopurinol-initially started 2019: She has not had any signs or symptoms of a gout flare.  She has been taking allopurinol 100 mg 1 tablet by mouth daily.  She restarted on allopurinol after her last office visit on 08/16/2022.  She has not missed any doses of allopurinol  recently. Uric acid was 2.4--within desirable range yesterday on 12/11/22.  CBC and CMP updated on 12/11/22. She will remain on allopurinol as prescribed. She was advised to notify us if she develops signs or symptoms of a gout flare.  She will follow-up in the office in 6 months or sooner if needed.  Hyperuricemia - Uric acid: 2.4 on 12/11/2022.  Medication management - Allopurinol 100 mg 1 tablet by mouth daily.  CBC, CMP, and uric acid level were updated yesterday on 12/11/2022. Uric acid is within the desirable range.   Chronic right shoulder pain -Patient presents today with increased right shoulder joint pain which started several months ago.  She has not had any injury or fall prior to the recurrence of symptoms.  She previously had a right shoulder joint cortisone injection on 04/15/2020 which provided significant relief but her symptoms have returned.  She will notify us if her symptoms persist or worsen at which time she would like to return for a right shoulder cortisone injection in the future.  X-rays of the right shoulder were updated today for further evaluation. She plans on working on range of motion and strengthening exercises at home.  Plan: XR Shoulder Right  Other medical conditions are listed as follows:  Trigger finger, left ring finger - Resolved.  History of hypertension: Blood pressure was 151/79 today in the office.  Blood pressure was rechecked prior to the patient  leaving.  She was advised to monitor her blood pressure closely.   Chronic anticoagulation: She remains on Eliquis as prescribed.  History of coronary artery disease  History of hyperlipidemia  History of atrial fibrillation: Scheduled for an ablation on 12/14/2022.  History of gastrointestinal stromal tumor (GIST)  OSA on CPAP  History of hypothyroidism  History of vitamin D deficiency     Orders: Orders Placed This Encounter  Procedures   XR Shoulder Right   No orders of the defined types were placed in this encounter.    Follow-Up Instructions: Return in about 6 months (around 06/12/2023) for Gout.   Gearldine Bienenstock, PA-C  Note - This record has been created using Dragon software.  Chart creation errors have been sought, but may not always  have been located. Such creation errors do not reflect on  the standard of medical care.

## 2022-12-12 ENCOUNTER — Encounter: Payer: Self-pay | Admitting: Physician Assistant

## 2022-12-12 ENCOUNTER — Ambulatory Visit: Payer: Medicare PPO | Attending: Physician Assistant | Admitting: Physician Assistant

## 2022-12-12 ENCOUNTER — Ambulatory Visit (INDEPENDENT_AMBULATORY_CARE_PROVIDER_SITE_OTHER): Payer: Medicare PPO

## 2022-12-12 VITALS — BP 151/79 | HR 58 | Resp 16 | Ht 65.0 in | Wt 216.6 lb

## 2022-12-12 DIAGNOSIS — M25511 Pain in right shoulder: Secondary | ICD-10-CM | POA: Diagnosis not present

## 2022-12-12 DIAGNOSIS — E790 Hyperuricemia without signs of inflammatory arthritis and tophaceous disease: Secondary | ICD-10-CM | POA: Diagnosis not present

## 2022-12-12 DIAGNOSIS — M1A09X0 Idiopathic chronic gout, multiple sites, without tophus (tophi): Secondary | ICD-10-CM | POA: Diagnosis not present

## 2022-12-12 DIAGNOSIS — Z8639 Personal history of other endocrine, nutritional and metabolic disease: Secondary | ICD-10-CM | POA: Diagnosis not present

## 2022-12-12 DIAGNOSIS — M65342 Trigger finger, left ring finger: Secondary | ICD-10-CM

## 2022-12-12 DIAGNOSIS — Z79899 Other long term (current) drug therapy: Secondary | ICD-10-CM | POA: Diagnosis not present

## 2022-12-12 DIAGNOSIS — G8929 Other chronic pain: Secondary | ICD-10-CM

## 2022-12-12 DIAGNOSIS — G4733 Obstructive sleep apnea (adult) (pediatric): Secondary | ICD-10-CM

## 2022-12-12 DIAGNOSIS — Z8509 Personal history of malignant neoplasm of other digestive organs: Secondary | ICD-10-CM

## 2022-12-12 DIAGNOSIS — Z7901 Long term (current) use of anticoagulants: Secondary | ICD-10-CM

## 2022-12-12 DIAGNOSIS — Z8679 Personal history of other diseases of the circulatory system: Secondary | ICD-10-CM

## 2022-12-12 LAB — COMPLETE METABOLIC PANEL WITH GFR
AG Ratio: 1.6 (calc) (ref 1.0–2.5)
ALT: 18 U/L (ref 6–29)
AST: 19 U/L (ref 10–35)
Albumin: 4.9 g/dL (ref 3.6–5.1)
Alkaline phosphatase (APISO): 105 U/L (ref 37–153)
BUN/Creatinine Ratio: 9 (calc) (ref 6–22)
BUN: 13 mg/dL (ref 7–25)
CO2: 29 mmol/L (ref 20–32)
Calcium: 9.7 mg/dL (ref 8.6–10.4)
Chloride: 107 mmol/L (ref 98–110)
Creat: 1.45 mg/dL — ABNORMAL HIGH (ref 0.60–1.00)
Globulin: 3.1 g/dL (calc) (ref 1.9–3.7)
Glucose, Bld: 82 mg/dL (ref 65–99)
Potassium: 4 mmol/L (ref 3.5–5.3)
Sodium: 147 mmol/L — ABNORMAL HIGH (ref 135–146)
Total Bilirubin: 0.7 mg/dL (ref 0.2–1.2)
Total Protein: 8 g/dL (ref 6.1–8.1)
eGFR: 38 mL/min/{1.73_m2} — ABNORMAL LOW (ref >=60)

## 2022-12-12 LAB — CBC WITH DIFFERENTIAL/PLATELET
Absolute Monocytes: 329 cells/uL (ref 200–950)
Basophils Absolute: 19 cells/uL (ref 0–200)
Basophils Relative: 0.5 %
Eosinophils Absolute: 59 cells/uL (ref 15–500)
Eosinophils Relative: 1.6 %
HCT: 50.2 % — ABNORMAL HIGH (ref 35.0–45.0)
Hemoglobin: 16.8 g/dL — ABNORMAL HIGH (ref 11.7–15.5)
Lymphs Abs: 1573 cells/uL (ref 850–3900)
MCH: 32.4 pg (ref 27.0–33.0)
MCHC: 33.5 g/dL (ref 32.0–36.0)
MCV: 96.9 fL (ref 80.0–100.0)
MPV: 9.4 fL (ref 7.5–12.5)
Monocytes Relative: 8.9 %
Neutro Abs: 1721 cells/uL (ref 1500–7800)
Neutrophils Relative %: 46.5 %
Platelets: 213 10*3/uL (ref 140–400)
RBC: 5.18 10*6/uL — ABNORMAL HIGH (ref 3.80–5.10)
RDW: 13.9 % (ref 11.0–15.0)
Total Lymphocyte: 42.5 %
WBC: 3.7 10*3/uL — ABNORMAL LOW (ref 3.8–10.8)

## 2022-12-12 LAB — URIC ACID: Uric Acid, Serum: 2.4 mg/dL — ABNORMAL LOW (ref 2.5–7.0)

## 2022-12-12 NOTE — Progress Notes (Signed)
Uric acid is in desirable range.  Creatinine is elevated and stable.  Sodium is elevated.  White cell count is low and stable.  Hemoglobin is elevated and stable.  Please forward results to her PCP.

## 2022-12-12 NOTE — Progress Notes (Signed)
X-rays of the right shoulder are consistent with [facility] joint arthritis.  Please notify the patient.  She will notify us when and if she would like to return for a right shoulder injection in the future.

## 2022-12-13 ENCOUNTER — Ambulatory Visit (HOSPITAL_COMMUNITY): Admit: 2022-12-13 | Payer: Medicare Other | Admitting: Cardiology

## 2022-12-13 ENCOUNTER — Encounter (HOSPITAL_COMMUNITY): Payer: Self-pay

## 2022-12-13 SURGERY — ECHOCARDIOGRAM, TRANSESOPHAGEAL
Anesthesia: Moderate Sedation

## 2022-12-13 NOTE — Anesthesia Preprocedure Evaluation (Addendum)
Anesthesia Evaluation  Patient identified by MRN, date of birth, ID band Patient awake    Reviewed: Allergy & Precautions, NPO status , Patient's Chart, lab work & pertinent test results  History of Anesthesia Complications (+) Family history of anesthesia reaction and history of anesthetic complications (daughter with difficulty waking up)  Airway Mallampati: III  TM Distance: >3 FB Neck ROM: Full   Comment: Previous grade I view with Mac 3, mask ventilation with OPA Dental  (+) Partial Lower, Partial Upper, Dental Advisory Given   Pulmonary neg shortness of breath, sleep apnea and Continuous Positive Airway Pressure Ventilation , neg COPD, neg recent URI, former smoker   Pulmonary exam normal breath sounds clear to auscultation       Cardiovascular hypertension (amlodipine, carvedilol, furosemide, ISDN, losartan), Pt. on medications and Pt. on home beta blockers (-) angina + CAD, + Past MI (1994) and +CHF (NICM)  (-) Cardiac Stents and (-) CABG + dysrhythmias (on amiodarone and Eliquis) Atrial Fibrillation  Rhythm:Regular Rate:Normal  HLD  TTE 04/24/2021: IMPRESSIONS     1. Left ventricular ejection fraction, by estimation, is 55 to 60%. The  left ventricle has normal function. The left ventricle has no regional  wall motion abnormalities. There is mild left ventricular hypertrophy.  Left ventricular diastolic parameters  are indeterminate.   2. Right ventricular systolic function is normal. The right ventricular  size is normal.   3. Left atrial size was mildly dilated.   4. The mitral valve is normal in structure. Mild mitral valve  regurgitation. No evidence of mitral stenosis.   5. The aortic valve is normal in structure. Aortic valve regurgitation is  mild. No aortic stenosis is present.   6. The inferior vena cava is normal in size with greater than 50%  respiratory variability, suggesting right atrial pressure of 3  mmHg.     Neuro/Psych negative neurological ROS     GI/Hepatic Neg liver ROS,GERD  ,,  Endo/Other  Hypothyroidism  Pre-diabetes (Hgb A1c 5.7)  Renal/GU CRFRenal disease     Musculoskeletal  (+) Arthritis , Rheumatoid disorders,    Abdominal  (+) + obese  Peds  Hematology negative hematology ROS (+)   Anesthesia Other Findings Gout  Last Eliquis: 12/13/2022 at 9:30 pm  EKG this morning shows NSR  Reproductive/Obstetrics H/o cervical cancer in 1977                             Anesthesia Physical Anesthesia Plan  ASA: 3  Anesthesia Plan: General   Post-op Pain Management:    Induction: Intravenous  PONV Risk Score and Plan: 3 and Ondansetron, Dexamethasone and Treatment may vary due to age or medical condition  Airway Management Planned: Oral ETT  Additional Equipment:   Intra-op Plan:   Post-operative Plan: Extubation in OR  Informed Consent: I have reviewed the patients History and Physical, chart, labs and discussed the procedure including the risks, benefits and alternatives for the proposed anesthesia with the patient or authorized representative who has indicated his/her understanding and acceptance.     Dental advisory given  Plan Discussed with: Anesthesiologist and CRNA  Anesthesia Plan Comments: (Risks of general anesthesia discussed including, but not limited to, sore throat, hoarse voice, chipped/damaged teeth, injury to vocal cords, nausea and vomiting, allergic reactions, lung infection, heart attack, stroke, and death. All questions answered. )        Anesthesia Quick Evaluation

## 2022-12-13 NOTE — Pre-Procedure Instructions (Signed)
Instructed patient on the following items: Arrival time 0515 Nothing to eat or drink after midnight No meds AM of procedure Responsible person to drive you home and stay with you for 24 hrs  Have you missed any doses of anti-coagulant Eliquis- hasn't missed any doses   

## 2022-12-14 ENCOUNTER — Other Ambulatory Visit: Payer: Self-pay

## 2022-12-14 ENCOUNTER — Encounter (HOSPITAL_COMMUNITY): Payer: Self-pay | Admitting: Cardiology

## 2022-12-14 ENCOUNTER — Ambulatory Visit (HOSPITAL_COMMUNITY): Payer: Medicare PPO | Admitting: Certified Registered"

## 2022-12-14 ENCOUNTER — Encounter (HOSPITAL_COMMUNITY)
Admission: RE | Disposition: A | Discharge status: Home / Self Care | Payer: Medicare Other | Source: Home / Self Care | Attending: Cardiology

## 2022-12-14 ENCOUNTER — Ambulatory Visit (HOSPITAL_BASED_OUTPATIENT_CLINIC_OR_DEPARTMENT_OTHER): Payer: Medicare PPO | Admitting: Certified Registered"

## 2022-12-14 ENCOUNTER — Ambulatory Visit (HOSPITAL_COMMUNITY)
Admission: RE | Admit: 2022-12-14 | Discharge: 2022-12-14 | Disposition: A | Discharge status: Home / Self Care | Payer: Medicare PPO | Attending: Cardiology | Admitting: Cardiology

## 2022-12-14 DIAGNOSIS — E785 Hyperlipidemia, unspecified: Secondary | ICD-10-CM | POA: Diagnosis not present

## 2022-12-14 DIAGNOSIS — I252 Old myocardial infarction: Secondary | ICD-10-CM | POA: Diagnosis not present

## 2022-12-14 DIAGNOSIS — N189 Chronic kidney disease, unspecified: Secondary | ICD-10-CM | POA: Diagnosis not present

## 2022-12-14 DIAGNOSIS — I130 Hypertensive heart and chronic kidney disease with heart failure and stage 1 through stage 4 chronic kidney disease, or unspecified chronic kidney disease: Secondary | ICD-10-CM | POA: Diagnosis not present

## 2022-12-14 DIAGNOSIS — I428 Other cardiomyopathies: Secondary | ICD-10-CM | POA: Diagnosis not present

## 2022-12-14 DIAGNOSIS — D6859 Other primary thrombophilia: Secondary | ICD-10-CM | POA: Diagnosis not present

## 2022-12-14 DIAGNOSIS — Z87891 Personal history of nicotine dependence: Secondary | ICD-10-CM

## 2022-12-14 DIAGNOSIS — I129 Hypertensive chronic kidney disease with stage 1 through stage 4 chronic kidney disease, or unspecified chronic kidney disease: Secondary | ICD-10-CM | POA: Diagnosis not present

## 2022-12-14 DIAGNOSIS — I4819 Other persistent atrial fibrillation: Secondary | ICD-10-CM

## 2022-12-14 DIAGNOSIS — Z79899 Other long term (current) drug therapy: Secondary | ICD-10-CM | POA: Insufficient documentation

## 2022-12-14 DIAGNOSIS — G4733 Obstructive sleep apnea (adult) (pediatric): Secondary | ICD-10-CM | POA: Insufficient documentation

## 2022-12-14 DIAGNOSIS — Z7901 Long term (current) use of anticoagulants: Secondary | ICD-10-CM | POA: Insufficient documentation

## 2022-12-14 DIAGNOSIS — K219 Gastro-esophageal reflux disease without esophagitis: Secondary | ICD-10-CM | POA: Insufficient documentation

## 2022-12-14 DIAGNOSIS — I4891 Unspecified atrial fibrillation: Secondary | ICD-10-CM | POA: Diagnosis not present

## 2022-12-14 DIAGNOSIS — I2510 Atherosclerotic heart disease of native coronary artery without angina pectoris: Secondary | ICD-10-CM | POA: Insufficient documentation

## 2022-12-14 DIAGNOSIS — E039 Hypothyroidism, unspecified: Secondary | ICD-10-CM | POA: Diagnosis not present

## 2022-12-14 DIAGNOSIS — I509 Heart failure, unspecified: Secondary | ICD-10-CM

## 2022-12-14 HISTORY — PX: ATRIAL FIBRILLATION ABLATION: EP1191

## 2022-12-14 LAB — POCT ACTIVATED CLOTTING TIME: Activated Clotting Time: 396 seconds

## 2022-12-14 SURGERY — ATRIAL FIBRILLATION ABLATION
Anesthesia: General

## 2022-12-14 MED ORDER — HEPARIN SODIUM (PORCINE) 1000 UNIT/ML IJ SOLN
INTRAMUSCULAR | Status: AC
  Filled 2022-12-14: qty 10

## 2022-12-14 MED ORDER — HEPARIN SODIUM (PORCINE) 1000 UNIT/ML IJ SOLN
INTRAMUSCULAR | Status: DC | PRN
  Administered 2022-12-14: 1000 [IU] via INTRAVENOUS

## 2022-12-14 MED ORDER — FENTANYL CITRATE (PF) 250 MCG/5ML IJ SOLN
INTRAMUSCULAR | Status: DC | PRN
  Administered 2022-12-14: 100 ug via INTRAVENOUS

## 2022-12-14 MED ORDER — HEPARIN (PORCINE) IN NACL 1000-0.9 UT/500ML-% IV SOLN
INTRAVENOUS | Status: DC | PRN
  Administered 2022-12-14 (×4): 500 mL

## 2022-12-14 MED ORDER — SODIUM CHLORIDE 0.9 % IV SOLN: 250.0000 mL | INTRAVENOUS | Status: DC | PRN

## 2022-12-14 MED ORDER — ACETAMINOPHEN 325 MG PO TABS: 650.0000 mg | ORAL_TABLET | ORAL | Status: DC | PRN

## 2022-12-14 MED ORDER — LACTATED RINGERS IV SOLN: INTRAVENOUS | Status: DC | PRN

## 2022-12-14 MED ORDER — ROCURONIUM BROMIDE 10 MG/ML (PF) SYRINGE
PREFILLED_SYRINGE | INTRAVENOUS | Status: DC | PRN
  Administered 2022-12-14: 60 mg via INTRAVENOUS

## 2022-12-14 MED ORDER — DEXAMETHASONE SODIUM PHOSPHATE 10 MG/ML IJ SOLN
INTRAMUSCULAR | Status: DC | PRN
  Administered 2022-12-14: 10 mg via INTRAVENOUS

## 2022-12-14 MED ORDER — PROPOFOL 10 MG/ML IV BOLUS
INTRAVENOUS | Status: DC | PRN
  Administered 2022-12-14: 150 mg via INTRAVENOUS

## 2022-12-14 MED ORDER — PHENYLEPHRINE HCL-NACL 20-0.9 MG/250ML-% IV SOLN
INTRAVENOUS | Status: DC | PRN
  Administered 2022-12-14: 30 ug/min via INTRAVENOUS

## 2022-12-14 MED ORDER — SODIUM CHLORIDE 0.9 % IV SOLN: INTRAVENOUS | Status: DC

## 2022-12-14 MED ORDER — HEPARIN (PORCINE) IN NACL 1000-0.9 UT/500ML-% IV SOLN
INTRAVENOUS | Status: AC
  Filled 2022-12-14: qty 2000

## 2022-12-14 MED ORDER — DOBUTAMINE INFUSION FOR EP/ECHO/NUC (1000 MCG/ML)
INTRAVENOUS | Status: DC | PRN
  Administered 2022-12-14: 20 ug/kg/min via INTRAVENOUS

## 2022-12-14 MED ORDER — HEPARIN SODIUM (PORCINE) 1000 UNIT/ML IJ SOLN
INTRAMUSCULAR | Status: DC | PRN
  Administered 2022-12-14: 14000 [IU] via INTRAVENOUS

## 2022-12-14 MED ORDER — PROTAMINE SULFATE 10 MG/ML IV SOLN
INTRAVENOUS | Status: DC | PRN
  Administered 2022-12-14: 20 mg via INTRAVENOUS
  Administered 2022-12-14: 40 mg via INTRAVENOUS

## 2022-12-14 MED ORDER — LIDOCAINE 2% (20 MG/ML) 5 ML SYRINGE
INTRAMUSCULAR | Status: DC | PRN
  Administered 2022-12-14: 60 mg via INTRAVENOUS

## 2022-12-14 MED ORDER — SUGAMMADEX SODIUM 200 MG/2ML IV SOLN
INTRAVENOUS | Status: DC | PRN
  Administered 2022-12-14: 200 mg via INTRAVENOUS

## 2022-12-14 MED ORDER — ONDANSETRON HCL 4 MG/2ML IJ SOLN
INTRAMUSCULAR | Status: DC | PRN
  Administered 2022-12-14: 4 mg via INTRAVENOUS

## 2022-12-14 MED ORDER — SODIUM CHLORIDE 0.9% FLUSH: 3.0000 mL | INTRAVENOUS | Status: DC | PRN

## 2022-12-14 MED ORDER — PHENYLEPHRINE 80 MCG/ML (10ML) SYRINGE FOR IV PUSH (FOR BLOOD PRESSURE SUPPORT)
PREFILLED_SYRINGE | INTRAVENOUS | Status: DC | PRN
  Administered 2022-12-14: 160 ug via INTRAVENOUS
  Administered 2022-12-14: 80 ug via INTRAVENOUS

## 2022-12-14 MED ORDER — DOBUTAMINE INFUSION FOR EP/ECHO/NUC (1000 MCG/ML)
INTRAVENOUS | Status: AC
  Filled 2022-12-14: qty 250

## 2022-12-14 MED ORDER — ACETAMINOPHEN 500 MG PO TABS
1000.0000 mg | ORAL_TABLET | Freq: Once | ORAL | Status: AC
  Administered 2022-12-14: 1000 mg via ORAL
  Filled 2022-12-14: qty 2

## 2022-12-14 MED ORDER — ONDANSETRON HCL 4 MG/2ML IJ SOLN: 4.0000 mg | Freq: Four times a day (QID) | INTRAMUSCULAR | Status: DC | PRN

## 2022-12-14 MED ORDER — EPHEDRINE SULFATE-NACL 50-0.9 MG/10ML-% IV SOSY
PREFILLED_SYRINGE | INTRAVENOUS | Status: DC | PRN
  Administered 2022-12-14 (×2): 10 mg via INTRAVENOUS
  Administered 2022-12-14: 5 mg via INTRAVENOUS

## 2022-12-14 MED ORDER — MIDAZOLAM HCL 2 MG/2ML IJ SOLN
INTRAMUSCULAR | Status: DC | PRN
  Administered 2022-12-14: 2 mg via INTRAVENOUS

## 2022-12-14 SURGICAL SUPPLY — 20 items
BAG SNAP BAND KOVER 36X36 (MISCELLANEOUS) IMPLANT
BLANKET WARM UNDERBOD FULL ACC (MISCELLANEOUS) ×2 IMPLANT
CATH ABLAT QDOT MICRO BI TC DF (CATHETERS) IMPLANT
CATH OCTARAY 2.0 F 3-3-3-3-3 (CATHETERS) IMPLANT
CATH PIGTAIL STEERABLE D1 8.7 (WIRE) IMPLANT
CATH S-M CIRCA TEMP PROBE (CATHETERS) IMPLANT
CATH SOUNDSTAR ECO 8FR (CATHETERS) IMPLANT
CATH WEB BI DIR CSDF CRV REPRO (CATHETERS) IMPLANT
CLOSURE PERCLOSE PROSTYLE (VASCULAR PRODUCTS) IMPLANT
COVER SWIFTLINK CONNECTOR (BAG) ×2 IMPLANT
PACK EP LATEX FREE (CUSTOM PROCEDURE TRAY) ×1
PACK EP LF (CUSTOM PROCEDURE TRAY) ×2 IMPLANT
PAD DEFIB RADIO PHYSIO CONN (PAD) ×2 IMPLANT
PATCH CARTO3 (PAD) IMPLANT
SHEATH CARTO VIZIGO SM CVD (SHEATH) IMPLANT
SHEATH PINNACLE 7F 10CM (SHEATH) IMPLANT
SHEATH PINNACLE 8F 10CM (SHEATH) IMPLANT
SHEATH PINNACLE 9F 10CM (SHEATH) IMPLANT
SHEATH PROBE COVER 6X72 (BAG) IMPLANT
TUBING SMART ABLATE COOLFLOW (TUBING) IMPLANT

## 2022-12-14 NOTE — Anesthesia Procedure Notes (Signed)
Procedure Name: Intubation Date/Time: 12/14/2022 7:44 AM  Performed by: Mosetta Pigeon, CRNAPre-anesthesia Checklist: Patient identified, Emergency Drugs available, Suction available and Patient being monitored Patient Re-evaluated:Patient Re-evaluated prior to induction Oxygen Delivery Method: Circle System Utilized Preoxygenation: Pre-oxygenation with 100% oxygen Induction Type: IV induction Ventilation: Mask ventilation without difficulty Laryngoscope Size: Mac and 3 Grade View: Grade I Tube type: Oral Tube size: 7.0 mm Number of attempts: 1 Airway Equipment and Method: Stylet and Oral airway Placement Confirmation: ETT inserted through vocal cords under direct vision, positive ETCO2 and breath sounds checked- equal and bilateral Secured at: 21 cm Tube secured with: Tape Dental Injury: Teeth and Oropharynx as per pre-operative assessment

## 2022-12-14 NOTE — Discharge Instructions (Signed)

## 2022-12-14 NOTE — Transfer of Care (Signed)
Immediate Anesthesia Transfer of Care Note  Patient: Claudia Cantrell  Procedure(s) Performed: ATRIAL FIBRILLATION ABLATION  Patient Location: Cath Lab  Anesthesia Type:General  Level of Consciousness: drowsy and patient cooperative  Airway & Oxygen Therapy: Patient Spontanous Breathing and Patient connected to nasal cannula oxygen  Post-op Assessment: Report given to RN and Post -op Vital signs reviewed and stable  Post vital signs: stable  Last Vitals:  Vitals Value Taken Time  BP 155/71 12/14/22 0936  Temp    Pulse 65 12/14/22 0938  Resp 16 12/14/22 0938  SpO2 91 % 12/14/22 0938  Vitals shown include unvalidated device data.  Last Pain:  Vitals:   12/14/22 0548  TempSrc:   PainSc: 0-No pain         Complications: There were no known notable events for this encounter.

## 2022-12-14 NOTE — Interval H&P Note (Signed)
History and Physical Interval Note:  12/14/2022 7:14 AM  Claudia Cantrell  has presented today for surgery, with the diagnosis of afib.  The various methods of treatment have been discussed with the patient and family. After consideration of risks, benefits and other options for treatment, the patient has consented to  Procedure(s): ATRIAL FIBRILLATION ABLATION (N/A) as a surgical intervention.  The patient's history has been reviewed, patient examined, no change in status, stable for surgery.  I have reviewed the patient's chart and labs.  Questions were answered to the patient's satisfaction.     Aleksey Newbern [employer]

## 2022-12-14 NOTE — Anesthesia Postprocedure Evaluation (Signed)
Anesthesia Post Note  Patient: ALONNIE BIEKER  Procedure(s) Performed: ATRIAL FIBRILLATION ABLATION     Patient location during evaluation: PACU Anesthesia Type: General Level of consciousness: awake Pain management: pain level controlled Vital Signs Assessment: post-procedure vital signs reviewed and stable Respiratory status: spontaneous breathing, nonlabored ventilation and respiratory function stable Cardiovascular status: blood pressure returned to baseline and stable Postop Assessment: no apparent nausea or vomiting Anesthetic complications: no   There were no known notable events for this encounter.  Last Vitals:  Vitals:   12/14/22 0956 12/14/22 1000  BP: (!) 135/55 (!) 146/67  Pulse: 66 65  Resp: 19 12  Temp: 36.7 C   SpO2: 95% 95%    Last Pain:  Vitals:   12/14/22 1011  TempSrc:   PainSc: 0-No pain   Pain Goal:                   Nilda Simmer

## 2022-12-28 ENCOUNTER — Encounter: Payer: Self-pay | Admitting: Family Medicine

## 2022-12-28 ENCOUNTER — Ambulatory Visit: Payer: Medicare PPO | Admitting: Family Medicine

## 2022-12-28 VITALS — BP 110/58 | HR 60 | Temp 97.6°F | Ht 65.0 in | Wt 214.0 lb

## 2022-12-28 DIAGNOSIS — E669 Obesity, unspecified: Secondary | ICD-10-CM

## 2022-12-28 DIAGNOSIS — G4733 Obstructive sleep apnea (adult) (pediatric): Secondary | ICD-10-CM | POA: Diagnosis not present

## 2022-12-28 DIAGNOSIS — R7303 Prediabetes: Secondary | ICD-10-CM

## 2022-12-28 DIAGNOSIS — E039 Hypothyroidism, unspecified: Secondary | ICD-10-CM | POA: Diagnosis not present

## 2022-12-28 DIAGNOSIS — E782 Mixed hyperlipidemia: Secondary | ICD-10-CM

## 2022-12-28 DIAGNOSIS — Z7901 Long term (current) use of anticoagulants: Secondary | ICD-10-CM

## 2022-12-28 DIAGNOSIS — I10 Essential (primary) hypertension: Secondary | ICD-10-CM

## 2022-12-28 NOTE — Progress Notes (Signed)
New Patient Office Visit  Subjective    Patient ID: Claudia Cantrell, female    DOB: 27-Jun-1946  Age: 77 y.o. MRN: [id number]  CC:  Chief Complaint  Patient presents with   Establish Care    Just had cardiac ablation, since the last time she has seen Latica Hohmann vision has gotten worse. Is having shoulder pain but was told this is due to developing arthritis.      HPI TECIA CINNAMON presents to establish care She was my patient at my old practice. Last saw her 08/2021  Other providers: Dr. Kelly/Dr. Curt Bears- Cardiology Dr. Rush Landmark- GI Dr. Burr Medico -oncology Dr. Estanislado Pandy- Rheumatology Nephrologist-  Dr. Carles Collet- Neurology Neurologist ruled out PD in past. Saw them for tremors in past.  Ophthalmologist- needs cataract surgery   She sees cardiology for HTN, A-fib, CHF, HL and other cardiac issues.  On Eliquis and no bleeding concerns   Recent cardiac ablation. She is having some shortness of breath which is improving.   OSA on CPAP  Prediabetes - last A1c 5.7% on 10/22/2022   Outpatient Encounter Medications as of 12/28/2022  Medication Sig   allopurinol (ZYLOPRIM) 100 MG tablet Take 1 tablet (100 mg total) by mouth daily.   amiodarone (PACERONE) 200 MG tablet Take 1 tablet (200 mg total) by mouth daily.   amLODipine (NORVASC) 5 MG tablet TAKE 1 & 1/2 (ONE & ONE-HALF) TABLETS BY MOUTH IN THE EVENING   apixaban (ELIQUIS) 5 MG TABS tablet Take 1 tablet by mouth twice daily   carvedilol (COREG) 12.5 MG tablet Take 12.5 mg by mouth 2 (two) times daily with a meal.   cyanocobalamin (VITAMIN B12) 1000 MCG tablet Take 1,000 mcg by mouth daily.   FARXIGA 10 MG TABS tablet Take 10 mg by mouth in the morning.   furosemide (LASIX) 20 MG tablet Take 20 mg by mouth daily as needed for edema.   isosorbide dinitrate (ISORDIL) 5 MG tablet Take 1 tablet by mouth twice daily   losartan (COZAAR) 25 MG tablet Take 25 mg by mouth at bedtime.   Multiple Vitamins-Minerals (OCUVITE ADULT 50+ PO) Take by  mouth.   olmesartan (BENICAR) 40 MG tablet Take 40 mg by mouth daily.   rosuvastatin (CRESTOR) 40 MG tablet Take 1 tablet by mouth once daily   SYNTHROID 100 MCG tablet Take 1 tablet (100 mcg total) by mouth daily before breakfast.   Vitamin D3 (VITAMIN D) 25 MCG tablet Take 1,000 Units by mouth 2 (two) times a week.   acetaminophen (TYLENOL) 500 MG tablet Take 1,000 mg by mouth every 6 (six) hours as needed for moderate pain. (Patient not taking: Reported on 12/28/2022)   Probiotic Product (PROBIOTIC PO) Take 1 capsule by mouth 2 (two) times a week. (Patient not taking: Reported on 12/12/2022)   [DISCONTINUED] carvedilol (COREG) 6.25 MG tablet Take 1 tablet (6.25 mg total) by mouth 2 (two) times daily with a meal. (Patient not taking: Reported on 12/28/2022)   [DISCONTINUED] hydrochlorothiazide (MICROZIDE) 12.5 MG capsule Take 12.5 mg by mouth every morning. (Patient not taking: Reported on 12/12/2022)   [DISCONTINUED] Magnesium 400 MG TABS Take 400 mg by mouth 2 (two) times a week. (Patient not taking: Reported on 12/12/2022)   [DISCONTINUED] Multiple Vitamins-Minerals (MULTIVITAMIN WITH MINERALS) tablet Take 1 tablet by mouth daily. (Patient not taking: Reported on 12/12/2022)   [DISCONTINUED] ofloxacin (OCUFLOX) 0.3 % ophthalmic solution  (Patient not taking: Reported on 12/12/2022)   [DISCONTINUED] prednisoLONE acetate (PRED FORTE) 1 % ophthalmic  suspension SMARTSIG:In Eye(s) (Patient not taking: Reported on 12/12/2022)   [DISCONTINUED] zinc gluconate 50 MG tablet Take 50 mg by mouth 2 (two) times a week. (Patient not taking: Reported on 12/12/2022)   No facility-administered encounter medications on file as of 12/28/2022.    Past Medical History:  Diagnosis Date   Arthritis    rheumatoid   CAD (coronary artery disease)    a. 1992 s/p MI and PTCA of unknown vessel;  b. 09/2010 Cath: LM nl, LAD 20p, LCX 51m RCA 157mRPL 20.   [place]   Chronic kidney disease    Family history of  anesthesia complication    daughter has difficulty waking    GERD (gastroesophageal reflux disease)    occ   Gout 10/08/2016   Hyperlipidemia    Hypertensive heart disease    Hypothyroidism    Nonischemic cardiomyopathy ([facility]   a. 07/2016 Echo: EF 40-45%, mild LVH, inferior akinesis, moderately dilated left atrium, trivial AI and MR.   PAF (paroxysmal atrial fibrillation) ([facility]   a. 07/2016 Admitted w/ AF RVR-->CHA2DS2VASc = 5-->Eliquis;  b. 07/2016 successful TEE/DCCV.   Sleep apnea    a. Using CPAP.    Past Surgical History:  Procedure Laterality Date   ABDOMINAL HYSTERECTOMY  1988   ATRIAL FIBRILLATION ABLATION N/A 07/28/2021   Procedure: ATRIAL FIBRILLATION ABLATION;  Surgeon: CaConstance HawMD;  [facility];  Service: Cardiovascular;  Laterality: N/A;   ATRIAL FIBRILLATION ABLATION N/A 12/14/2022   Procedure: ATRIAL FIBRILLATION ABLATION;  Surgeon: CaConstance HawMD;  [facility];  Service: Cardiovascular;  Laterality: N/A;   BAPlantation BIOPSY  09/02/2019   Procedure: BIOPSY;  Surgeon: MaRush LandmarkaTelford Nab MD;  [facility];  Service: Gastroenterology;;   BIOPSY  12/09/2019   Procedure: BIOPSY;  Surgeon: MaIrving Copas MD;  [facility];  Service: Gastroenterology;;   BIOPSY  03/08/2021   Procedure: BIOPSY;  Surgeon: MaIrving Copas MD;  [facility];  Service: Gastroenterology;;   [place] PT[place]/A 08/01/2016   Procedure: CARDIOVERSION;  Surgeon: BrLelon PerlaMD;  [facility];  Service: Cardiovascular;  Laterality: N/A;   CARDIOVERSION N/A 01/11/2020   Procedure: CARDIOVERSION;  Surgeon: NiJosue HectorMD;  [facility];  Service: Cardiovascular;  Laterality: N/A;   CARDIOVERSION N/A 07/08/2020   Procedure: CARDIOVERSION;  Surgeon: ScDonato HeinzMD;  [facility];  Service: Cardiovascular;  Laterality: N/A;   CARDIOVERSION N/A 05/31/2022   Procedure: CARDIOVERSION;  Surgeon: HiPixie CasinoMD;  [place] Service: Cardiovascular;  Laterality: N/A;   CARDIOVERSION N/A 07/18/2022   Procedure: CARDIOVERSION;  Surgeon: ScDonato HeinzMD;  Location[facility] Service: Cardiovascular;  Laterality: N/A;   ENDOSCOPIC MUCOSAL RESECTION N/A 12/09/2019   Procedure: ENDOSCOPIC MUCOSAL RESECTION;  Surgeon: MaIrving Copas MD;  [facility];  Service: Gastroenterology;  Laterality: N/A;   ESOPHAGOGASTRODUODENOSCOPY N/A 03/08/2021   Procedure: ESOPHAGOGASTRODUODENOSCOPY (EGD);  Surgeon: MaIrving Copas MD;  [facility];  Service: Gastroenterology;  Laterality: N/A;   ESOPHAGOGASTRODUODENOSCOPY (EGD) WITH PROPOFOL N/A 09/02/2019   Procedure: ESOPHAGOGASTRODUODENOSCOPY (EGD) WITH PROPOFOL;  Surgeon: MaRush LandmarkaTelford Nab MD;  [facility];  Service: Gastroenterology;  Laterality: N/A;   ESOPHAGOGASTRODUODENOSCOPY (EGD) WITH PROPOFOL N/A 12/09/2019   Procedure: ESOPHAGOGASTRODUODENOSCOPY (EGD) WITH PROPOFOL;  Surgeon:  Mansouraty, Telford Nab., MD;  [facility];  Service: Gastroenterology;  Laterality: N/A;   EUS N/A 09/02/2019   Procedure: UPPER ENDOSCOPIC ULTRASOUND (EUS) RADIAL;  Surgeon: Irving Copas., MD;  [facility];  Service: Gastroenterology;  Laterality: N/A;  EUS radial/linear   EUS N/A 12/09/2019   Procedure: UPPER ENDOSCOPIC ULTRASOUND (EUS) RADIAL;  Surgeon: Irving Copas., MD;  [facility];  Service: Gastroenterology;  Laterality: N/A;   EUS  12/09/2019   Procedure: UPPER ENDOSCOPIC ULTRASOUND (EUS) LINEAR;  Surgeon: Irving Copas., MD;  [facility];  Service: Gastroenterology;;   EUS N/A 03/08/2021   Procedure: UPPER ENDOSCOPIC ULTRASOUND (EUS) RADIAL;  Surgeon: Irving Copas., MD;  [facility];  Service: Gastroenterology;  Laterality: N/A;   FINE NEEDLE ASPIRATION  09/02/2019   Procedure: FINE NEEDLE ASPIRATION (FNA) LINEAR;  Surgeon: Irving Copas., MD;  [facility];  Service: Gastroenterology;;   FINE NEEDLE ASPIRATION  12/09/2019   Procedure: FINE NEEDLE ASPIRATION (FNA) LINEAR;  Surgeon: Irving Copas., MD;  [facility];  Service: Gastroenterology;;   HEMOSTASIS CLIP PLACEMENT  12/09/2019   Procedure: HEMOSTASIS CLIP PLACEMENT;  Surgeon: Irving Copas., MD;  [facility];  Service: Gastroenterology;;   ORIF ANKLE FRACTURE Right 04/27/2015   Procedure: OPEN REDUCTION INTERNAL FIXATION (ORIF) RIGHT BIMALLEOLAR ANKLE FRACTURE WITH SYNDESMOSIS FIXATION;  Surgeon: Leandrew Koyanagi, MD;  Location: [place];  Service: Orthopedics;  Laterality: Right;   SUBMUCOSAL LIFTING INJECTION  12/09/2019   Procedure: SUBMUCOSAL LIFTING INJECTION;  Surgeon: Irving Copas., MD;  [facility];  Service: Gastroenterology;;   TEE WITHOUT CARDIOVERSION N/A 08/01/2016   Procedure: TRANSESOPHAGEAL ECHOCARDIOGRAM (TEE);  Surgeon: Lelon Perla, MD;  [facility];  Service: Cardiovascular;  Laterality: N/A;   THYROIDECTOMY  11/24/2013   DR Dalbert Batman   THYROIDECTOMY N/A 11/24/2013   Procedure: TOTAL THYROIDECTOMY;  Surgeon: Adin Hector, MD;  Location: [place];  Service: General;  Laterality: N/A;   TRANSTHORACIC ECHOCARDIOGRAM  01/15/2013   EF 55% TO 65%. PROBABLE MILD HYPOKINESIS OF THE INFERIOR MYOCARDIUM. GRADE 1 DIASTOLIC DYSFUNCTION. TRIAL AR.LA IS MILDLY DILATED.    Family History  Problem Relation Age of Onset   Heart disease Mother    Sudden death Mother    Diabetes Father    Stroke Father    Bone cancer Sister    Sudden death Brother    Melanoma Brother    Heart disease Brother    Colon cancer Neg Hx    Esophageal cancer Neg Hx    Inflammatory bowel disease Neg Hx    Liver disease Neg Hx     Pancreatic cancer Neg Hx    Rectal cancer Neg Hx    Stomach cancer Neg Hx     Social History   Socioeconomic History   Marital status: Widowed    Spouse name: Not on file   Number of children: 3   Years of education: Not on file   Highest education level: Not on file  Occupational History   Occupation: retired  Tobacco Use   Smoking status: Former    Packs/day: 1.00    Years: 15.00    Total pack years: 15.00    Types: Cigarettes    Quit date: 12/10/1990    Years since quitting: 32.0    Passive exposure: Never   Smokeless tobacco: Never   Tobacco comments:    Former smoker 05/21/22  Vaping Use   Vaping Use: Never used  Substance and Sexual Activity   Alcohol use: Yes    Comment: very rare   Drug use: No   Sexual activity: Not on file  Other Topics Concern   Not on file  Social History Narrative   Right handed    Living  alone.   Social Determinants of Health   Financial Resource Strain: Not on file  Food Insecurity: Not on file  Transportation Needs: Not on file  Physical Activity: Not on file  Stress: Not on file  Social Connections: Not on file  Intimate Partner Violence: Not on file    ROS      Objective    BP (!) 110/58 (BP Location: Right Arm, Patient Position: Sitting, Cuff Size: Large)   Pulse 60   Temp 97.6 F (36.4 C) (Temporal)   Ht '5\' 5"'$  (1.651 m)   Wt 214 lb (97.1 kg)   SpO2 98%   BMI 35.61 kg/m   Physical Exam Constitutional:      General: She is not in acute distress.    Appearance: She is not ill-appearing.  Cardiovascular:     Rate and Rhythm: Normal rate and regular rhythm.  Pulmonary:     Effort: Pulmonary effort is normal.     Breath sounds: Normal breath sounds.  Skin:    General: Skin is warm and dry.  Neurological:     General: No focal deficit present.     Mental Status: She is alert and oriented to person, place, and time.  Psychiatric:        Mood and Affect: Mood normal.        Behavior: Behavior normal.         Thought Content: Thought content normal.         Assessment & Plan:   Problem List Items Addressed This Visit       Cardiovascular and Mediastinum   Essential hypertension   Relevant Medications   carvedilol (COREG) 12.5 MG tablet   Other Relevant Orders   Amb Ref to Medical Weight Management     Respiratory   OSA on CPAP   Relevant Orders   Amb Ref to Medical Weight Management     Endocrine   Acquired hypothyroidism   Relevant Medications   carvedilol (COREG) 12.5 MG tablet   Other Relevant Orders   Amb Ref to Medical Weight Management     Other   Chronic anticoagulation   Mixed hyperlipidemia   Relevant Medications   carvedilol (COREG) 12.5 MG tablet   Prediabetes - Primary   Relevant Orders   Amb Ref to Medical Weight Management   Other Visit Diagnoses     Obesity (BMI 30-39.9)       Relevant Orders   Amb Ref to Medical Weight Management      She is here to re-establish care with me. Last saw her in 2022.  She sees several specialists which we reviewed today.  Reviewed lab results from previous visits and notes from cardiologist.  She would like help with weight loss. Referral to [facility] Continue current medications.  Follow up with me in 3 months or sooner if needed.   Return in about 3 months (around 03/29/2023).   Harland Dingwall, NP-C

## 2023-01-07 ENCOUNTER — Other Ambulatory Visit: Payer: Self-pay | Admitting: Cardiovascular Disease

## 2023-01-10 ENCOUNTER — Ambulatory Visit: Payer: Medicare Other | Admitting: Dietician

## 2023-01-11 ENCOUNTER — Ambulatory Visit (HOSPITAL_COMMUNITY): Payer: Medicare Other | Admitting: Physician Assistant

## 2023-01-16 ENCOUNTER — Ambulatory Visit (HOSPITAL_COMMUNITY)
Admission: RE | Admit: 2023-01-16 | Discharge: 2023-01-16 | Disposition: A | Discharge status: Home / Self Care | Payer: Medicare PPO | Source: Ambulatory Visit | Attending: Physician Assistant | Admitting: Physician Assistant

## 2023-01-16 ENCOUNTER — Encounter (HOSPITAL_COMMUNITY): Payer: Self-pay | Admitting: Physician Assistant

## 2023-01-16 VITALS — BP 144/90 | HR 53 | Ht 65.0 in | Wt 213.4 lb

## 2023-01-16 DIAGNOSIS — G4733 Obstructive sleep apnea (adult) (pediatric): Secondary | ICD-10-CM | POA: Diagnosis not present

## 2023-01-16 DIAGNOSIS — I2510 Atherosclerotic heart disease of native coronary artery without angina pectoris: Secondary | ICD-10-CM | POA: Insufficient documentation

## 2023-01-16 DIAGNOSIS — I4819 Other persistent atrial fibrillation: Secondary | ICD-10-CM

## 2023-01-16 DIAGNOSIS — Z8616 Personal history of COVID-19: Secondary | ICD-10-CM | POA: Insufficient documentation

## 2023-01-16 DIAGNOSIS — D6869 Other thrombophilia: Secondary | ICD-10-CM

## 2023-01-16 DIAGNOSIS — I10 Essential (primary) hypertension: Secondary | ICD-10-CM | POA: Diagnosis not present

## 2023-01-16 DIAGNOSIS — Z7901 Long term (current) use of anticoagulants: Secondary | ICD-10-CM | POA: Insufficient documentation

## 2023-01-16 DIAGNOSIS — Z79899 Other long term (current) drug therapy: Secondary | ICD-10-CM | POA: Diagnosis not present

## 2023-01-16 NOTE — Progress Notes (Signed)
Primary Care Physician: Girtha Rm, NP-C Referring Physician: Dr. Elmore Guise is a 77 y.o. female with a h/o atrial fibrillation, CAD, OSA, HTN who presents for follow up in the [facility]. Patient is s/p afib ablation with Dr Curt Bears on 07/28/21. She had done well with no know episodes of afib until she presented to the ED on 03/24/22 with chest pain. She tested positive for COVID at that time. ECG showed rate controlled afib. Seen by Dr Claiborne Billings on 05/15/22 and she was still in afib, her carvedilol was increased.   Patient is s/p DCCV 05/31/22 which was unsuccessful after 4 shocks. She has symptoms of fatigue and exercise intolerance when in afib. She was started on amiodarone as a bridge to possible repeat ablation. S/p DCCV on 07/18/22.  On follow up today, patient is s/p afib ablation with Dr Curt Bears on 12/14/22. She reports that since the ablation she has more energy and is able to walk further. She does have some mild groin discomfort but this is improving. No chest pain or swallowing pain.   Today, she denies symptoms of palpitations, chest pain, shortness of breath, orthopnea, PND, lower extremity edema, dizziness, presyncope, syncope, or neurologic sequela. The patient is tolerating medications without difficulties and is otherwise without complaint today.   Past Medical History:  Diagnosis Date   Arthritis    rheumatoid   CAD (coronary artery disease)    a. 1992 s/p MI and PTCA of unknown vessel;  b. 09/2010 Cath: LM nl, LAD 20p, LCX 70m RCA 169mRPL 20.   Cervical cancer ([facility]   Chronic kidney disease    Family history of anesthesia complication    daughter has difficulty waking    GERD (gastroesophageal reflux disease)    occ   Gout 10/08/2016   Hyperlipidemia    Hypertensive heart disease    Hypothyroidism    Nonischemic cardiomyopathy ([facility]   a. 07/2016 Echo: EF 40-45%, mild LVH, inferior akinesis, moderately dilated left atrium,  trivial AI and MR.   PAF (paroxysmal atrial fibrillation) ([facility]   a. 07/2016 Admitted w/ AF RVR-->CHA2DS2VASc = 5-->Eliquis;  b. 07/2016 successful TEE/DCCV.   Sleep apnea    a. Using CPAP.   Past Surgical History:  Procedure Laterality Date   ABDOMINAL HYSTERECTOMY  1988   ATRIAL FIBRILLATION ABLATION N/A 07/28/2021   Procedure: ATRIAL FIBRILLATION ABLATION;  Surgeon: CaConstance HawMD;  [facility];  Service: Cardiovascular;  Laterality: N/A;   ATRIAL FIBRILLATION ABLATION N/A 12/14/2022   Procedure: ATRIAL FIBRILLATION ABLATION;  Surgeon: CaConstance HawMD;  [facility];  Service: Cardiovascular;  Laterality: N/A;   [place] BI[place]SY  09/02/2019   Procedure: BIOPSY;  Surgeon: MaRush LandmarkaTelford Nab MD;  [facility];  Service: Gastroenterology;;   BIOPSY  12/09/2019   Procedure: BIOPSY;  Surgeon: MaIrving Copas MD;  [facility];  Service: Gastroenterology;;   BIOPSY  03/08/2021   Procedure: BIOPSY;  Surgeon: MaIrving Copas MD;  [facility];  Service: Gastroenterology;;   [place] PTCA BY DR CHDale/A 08/01/2016   Procedure: CARDIOVERSION;  Surgeon: BrLelon PerlaMD;  [facility];  Service: Cardiovascular;  Laterality: N/A;   CARDIOVERSION N/A 01/11/2020   Procedure: CARDIOVERSION;  Surgeon: NiJosue HectorMD;  [facility] Service: Cardiovascular;  Laterality: N/A;   CARDIOVERSION N/A 07/08/2020   Procedure: CARDIOVERSION;  Surgeon: [name], MD;  Location: [facility] ENDOSCOPY;  Service: Cardiovascular;  Laterality: N/A;   CARDIOVERSION N/A 05/31/2022   Procedure: CARDIOVERSION;  Surgeon: [name], MD;  Location: [facility] ENDOSCOPY;  Service: Cardiovascular;  Laterality: N/A;   CARDIOVERSION N/A 07/18/2022   Procedure: CARDIOVERSION;  Surgeon: [name], MD;  Location: [place];  Service: Cardiovascular;  Laterality: N/A;   ENDOSCOPIC MUCOSAL RESECTION N/A 12/09/2019   Procedure: ENDOSCOPIC MUCOSAL RESECTION;  Surgeon: Rush Landmark Telford Nab., MD;  [facility]: [facility];  Service: Gastroenterology;  Laterality: N/A;   ESOPHAGOGASTRODUODENOSCOPY N/A 03/08/2021   Procedure: ESOPHAGOGASTRODUODENOSCOPY (EGD);  Surgeon: Irving Copas., MD;  [facility];  Service: Gastroenterology;  Laterality: N/A;   ESOPHAGOGASTRODUODENOSCOPY (EGD) WITH PROPOFOL N/A 09/02/2019   Procedure: ESOPHAGOGASTRODUODENOSCOPY (EGD) WITH PROPOFOL;  Surgeon: Rush Landmark Telford Nab., MD;  Location: [facility] ENDOSCOPY;  Service: Gastroenterology;  Laterality: N/A;   ESOPHAGOGASTRODUODENOSCOPY (EGD) WITH PROPOFOL N/A 12/09/2019   Procedure: ESOPHAGOGASTRODUODENOSCOPY (EGD) WITH PROPOFOL;  Surgeon: Rush Landmark Telford Nab., MD;  Location: [facility] ENDOSCOPY;  Service: Gastroenterology;  Laterality: N/A;   EUS N/A 09/02/2019   Procedure: UPPER ENDOSCOPIC ULTRASOUND (EUS) RADIAL;  Surgeon: Irving Copas., MD;  Location: [facility];  Service: Gastroenterology;  Laterality: N/A;  EUS radial/linear   EUS N/A 12/09/2019   Procedure: UPPER ENDOSCOPIC ULTRASOUND (EUS) RADIAL;  Surgeon: Irving Copas., MD;  Location: [facility] ENDOS[facility];  Service: Gastroenterology;  Laterality: N/A;   EUS  12/09/2019   Procedure: UPPER ENDOSCOPIC ULTRASOUND (EUS) LINEAR;  Surgeon: Irving Copas., MD;  [facility];  Service: Gastroenterology;;   EUS N/A 03/08/2021   Procedure: UPPER ENDOSCOPIC ULTRASOUND (EUS) RADIAL;  Surgeon: Irving Copas., MD;  [facility];  Service: Gastroenterology;  Laterality: N/A;   FINE NEEDLE ASPIRATION  09/02/2019   Procedure: FINE NEEDLE ASPIRATION (FNA) LINEAR;  Surgeon: Irving Copas., MD;  [facility];  Service: Gastroenterology;;   FINE NEEDLE ASPIRATION  12/09/2019   Procedure: FINE NEEDLE ASPIRATION (FNA) LINEAR;   Surgeon: Irving Copas., MD;  Location: [facility];  Service: Gastroenterology;;   HEMOSTASIS CLIP PLACEMENT  12/09/2019   Procedure: HEMOSTASIS CLIP PLACEMENT;  Surgeon: Irving Copas., MD;  [facility];  Service: Gastroenterology;;   ORIF ANKLE FRACTURE Right 04/27/2015   Procedure: OPEN REDUCTION INTERNAL FIXATION (ORIF) RIGHT BIMALLEOLAR ANKLE FRACTURE WITH SYNDESMOSIS FIXATION;  Surgeon: Leandrew Koyanagi, MD;  Location: [place];  Service: Orthopedics;  Laterality: Right;   SUBMUCOSAL LIFTING INJECTION  12/09/2019   Procedure: SUBMUCOSAL LIFTING INJECTION;  Surgeon: Irving Copas., MD;  [facility];  Service: Gastroenterology;;   TEE WITHOUT CARDIOVERSION N/A 08/01/2016   Procedure: TRANSESOPHAGEAL ECHOCARDIOGRAM (TEE);  Surgeon: Lelon Perla, MD;  [facility];  Service: Cardiovascular;  Laterality: N/A;   THYROIDECTOMY  11/24/2013   DR Dalbert Batman   THYROIDECTOMY N/A 11/24/2013   Procedure: TOTAL THYROIDECTOMY;  Surgeon: Adin Hector, MD;  Location: [place];  Service: General;  Laterality: N/A;   TRANSTHORACIC ECHOCARDIOGRAM  01/15/2013   EF 55% TO 65%. PROBABLE MILD HYPOKINESIS OF THE INFERIOR MYOCARDIUM. GRADE 1 DIASTOLIC DYSFUNCTION. TRIAL AR.LA IS MILDLY DILATED.    Current Outpatient Medications  Medication Sig Dispense Refill   acetaminophen (TYLENOL) 500 MG tablet Take 1,000 mg by mouth every 6 (six) hours as needed for moderate pain.     allopurinol (ZYLOPRIM) 100 MG tablet Take 1 tablet (100 mg total) by mouth daily.  30 tablet 2   amiodarone (PACERONE) 200 MG tablet Take 1 tablet (200 mg total) by mouth daily. 30 tablet 3   amLODipine (NORVASC) 5 MG tablet TAKE 1 & 1/2 (ONE & ONE-HALF) TABLETS BY MOUTH IN THE EVENING 135 tablet 0   apixaban (ELIQUIS) 5 MG TABS tablet Take 1 tablet by mouth twice daily 180 tablet 1   carvedilol (COREG) 12.5 MG tablet Take 12.5 mg by mouth 2 (two) times daily with a meal.     cyanocobalamin  (VITAMIN B12) 1000 MCG tablet Take 1,000 mcg by mouth daily.     FARXIGA 10 MG TABS tablet Take 10 mg by mouth in the morning.     furosemide (LASIX) 20 MG tablet Take 20 mg by mouth daily as needed for edema.     isosorbide dinitrate (ISORDIL) 5 MG tablet Take 1 tablet by mouth twice daily 180 tablet 0   losartan (COZAAR) 25 MG tablet Take 25 mg by mouth at bedtime.     Multiple Vitamins-Minerals (OCUVITE ADULT 50+ PO) Take by mouth.     olmesartan (BENICAR) 40 MG tablet Take 40 mg by mouth daily.     Probiotic Product (PROBIOTIC PO) Take 1 capsule by mouth 2 (two) times a week.     rosuvastatin (CRESTOR) 40 MG tablet Take 1 tablet by mouth once daily 90 tablet 1   SYNTHROID 100 MCG tablet Take 1 tablet (100 mcg total) by mouth daily before breakfast. 90 tablet 3   Vitamin D3 (VITAMIN D) 25 MCG tablet Take 1,000 Units by mouth 2 (two) times a week.     No current facility-administered medications for this encounter.    Allergies  Allergen Reactions   Labetalol     headaches   Codeine Anxiety    Social History   Socioeconomic History   Marital status: Widowed    Spouse name: Not on file   Number of children: 3   Years of education: Not on file   Highest education level: Not on file  Occupational History   Occupation: retired  Tobacco Use   Smoking status: Former    Packs/day: 1.00    Years: 15.00    Total pack years: 15.00    Types: Cigarettes    Quit date: 12/10/1990    Years since quitting: 32.1    Passive exposure: Never   Smokeless tobacco: Never   Tobacco comments:    Former smoker 05/21/22  Vaping Use   Vaping Use: Never used  Substance and Sexual Activity   Alcohol use: Yes    Comment: very rare   Drug use: No   Sexual activity: Not on file  Other Topics Concern   Not on file  Social History Narrative   Right handed    Living  alone.   Social Determinants of Health   Financial Resource Strain: Not on file  Food Insecurity: Not on file  Transportation  Needs: Not on file  Physical Activity: Not on file  Stress: Not on file  Social Connections: Not on file  Intimate Partner Violence: Not on file    Family History  Problem Relation Age of Onset   Heart disease Mother    Sudden death Mother    Diabetes Father    Stroke Father    Bone cancer Sister    Sudden death Brother    Melanoma Brother    Heart disease Brother    Colon cancer Neg Hx    Esophageal cancer Neg Hx  Inflammatory bowel disease Neg Hx    Liver disease Neg Hx    Pancreatic cancer Neg Hx    Rectal cancer Neg Hx    Stomach cancer Neg Hx     ROS- All systems are reviewed and negative except as per the HPI above  Physical Exam: Vitals:   01/16/23 1450  BP: (!) 144/90  Pulse: (!) 53  Weight: 96.8 kg  Height: '5\' 5"'$  (1.651 m)    Wt Readings from Last 3 Encounters:  01/16/23 96.8 kg  12/28/22 97.1 kg  12/14/22 98 kg    Labs: Lab Results  Component Value Date   NA 147 (H) 12/11/2022   K 4.0 12/11/2022   CL 107 12/11/2022   CO2 29 12/11/2022   GLUCOSE 82 12/11/2022   BUN 13 12/11/2022   CREATININE 1.45 (H) 12/11/2022   CALCIUM 9.7 12/11/2022   MG 2.6 (H) 03/24/2022   Lab Results  Component Value Date   INR 1.3 (H) 08/18/2019   Lab Results  Component Value Date   CHOL 141 05/09/2022   HDL 42 05/09/2022   LDLCALC 78 05/09/2022   TRIG 116 05/09/2022    GEN- The patient is a well appearing obese female, alert and oriented x 3 today.   HEENT-head normocephalic, atraumatic, sclera clear, conjunctiva pink, hearing intact, trachea midline. Lungs- Clear to ausculation bilaterally, normal work of breathing Heart- Regular rate and rhythm, no murmurs, rubs or gallops  GI- soft, NT, ND, + BS Extremities- no clubbing, cyanosis, or edema MS- no significant deformity or atrophy Skin- no rash or lesion Psych- euthymic mood, full affect Neuro- strength and sensation are intact   EKG- SB Vent. rate 53 BPM PR interval 184 ms QRS duration 88  ms QT/QTcB 448/420 ms   Epic records reviewed   CHA2DS2-VASc Score = 5  The patient's score is based upon: CHF History: 0 HTN History: 1 Diabetes History: 1 Stroke History: 0 Vascular Disease History: 1 Age Score: 1 Gender Score: 1       ASSESSMENT AND PLAN: 1. Persistent Atrial Fibrillation (ICD10:  I48.19) The patient's CHA2DS2-VASc score is 5, indicating a 7.2% annual risk of stroke.   S/p afib ablation 07/28/21 with repeat ablation 12/14/22 Patient appears to be maintaining SR. Continue amiodarone 200 mg daily Continue carvedilol 12.5 mg BID Continue Eliquis 5 mg BID with no missed doses for 3 months post ablation.   2. Secondary Hypercoagulable State (ICD10:  D68.69) The patient is at significant risk for stroke/thromboembolism based upon her CHA2DS2-VASc Score of 5.  Continue Apixaban (Eliquis).   3. HTN Stable, no changes today.  4. CAD No anginal symptoms.  5. OSA Followed by Dr Claiborne Billings. Encouraged compliance with CPAP therapy.   Follow up with Dr Curt Bears as scheduled.    [facility] [address] [place], [place] [address] [phone]

## 2023-01-17 ENCOUNTER — Encounter (HOSPITAL_COMMUNITY): Payer: Self-pay | Admitting: *Deleted

## 2023-01-17 ENCOUNTER — Ambulatory Visit: Payer: Self-pay | Admitting: Dietician

## 2023-02-18 ENCOUNTER — Ambulatory Visit (INDEPENDENT_AMBULATORY_CARE_PROVIDER_SITE_OTHER): Payer: Medicare PPO | Admitting: Nurse Practitioner

## 2023-02-18 VITALS — BP 154/81 | HR 59 | Temp 98.0°F | Ht 65.0 in | Wt 203.0 lb

## 2023-02-18 DIAGNOSIS — Z6833 Body mass index (BMI) 33.0-33.9, adult: Secondary | ICD-10-CM

## 2023-02-18 DIAGNOSIS — E669 Obesity, unspecified: Secondary | ICD-10-CM

## 2023-02-18 DIAGNOSIS — I10 Essential (primary) hypertension: Secondary | ICD-10-CM

## 2023-02-18 DIAGNOSIS — Z0289 Encounter for other administrative examinations: Secondary | ICD-10-CM

## 2023-02-18 NOTE — Progress Notes (Signed)
Office: [phone]  /  Fax: [phone]   Initial Visit  Claudia Cantrell was seen in clinic today to evaluate for obesity. She is interested in losing weight to improve overall health and reduce the risk of weight related complications. She presents today to review program treatment options, initial physical assessment, and evaluation.     She was referred by: PCP  When asked what else they would like to accomplish? She states: Adopt healthier eating patterns, Improve energy levels and physical activity, Improve existing medical conditions, Reduce number of medications, Improve quality of life, Improve self-confidence, and Lose a target amount of weight : Goal weight 150-175lbs  Weight history: She started gaining weight 10-15 years ago-in her 66s.  When asked how has your weight affected you? She states: Contributed to medical problems, Having fatigue, Having poor endurance, and Problems with eating patterns  Some associated conditions: Congestive heart failure, CAD, hypertension, atrial fibrillation, cardiomyopathy, obstructive sleep apnea syndrome on CPAP, hypothyroidism, overactive bladder, chronic renal insufficiency stage II, dyslipidemia, gout, mixed hyperlipidemia, prediabetes, vitamin D deficiency, MI  Contributing factors: Family history, Nutritional, Medications, and Life event  Weight promoting medications identified: Other: unsure  Current nutrition plan: Low-carb  Current level of physical activity: Walking and Step counting  Current or previous pharmacotherapy: None  Response to medication: Never tried medications   Past medical history includes:   Past Medical History:  Diagnosis Date   Arthritis    rheumatoid   CAD (coronary artery disease)    a. 1992 s/p MI and PTCA of unknown vessel;  b. 09/2010 Cath: LM nl, LAD 20p, LCX 38m RCA 176mRPL 20.   Cervical cancer ([facility]1977   Chronic kidney disease    Family history of anesthesia complication    daughter has  difficulty waking    GERD (gastroesophageal reflux disease)    occ   Gout 10/08/2016   Hyperlipidemia    Hypertensive heart disease    Hypothyroidism    [place]ischemic cardiomyopathy ([place]   a. 07/2016 Echo: EF 40-45%, mild LVH, inferior akinesis, moderately dilated left atrium, trivial AI and MR.   PAF (paroxysmal [place] fibrillation) ([place]   a. 07/2016 Admitted w/ AF RVR-->CHA2DS2VASc = 5-->Eliquis;  b. 07/2016 successful TEE/DCCV.   Sleep apnea    a. Using CPAP.     Objective:   BP (!) 154/81   Pulse (!) 59   Temp 98 F (36.7 C)   Ht '5\' 5"'$  (1.651 m)   Wt 203 lb (92.1 kg)   SpO2 98%   BMI 33.78 kg/m  She was weighed on the bioimpedance scale: Body mass index is 33.78 kg/m.  Peak Weight:230 lbs , Body Fat%:46.1, Visceral Fat Rating:14, Weight trend over the last 12 months: Decreasing  General:  Alert, oriented and cooperative. Patient is in no acute distress.  Respiratory: Normal respiratory effort, no problems with respiration noted   Gait: able to ambulate independently  Mental Status: Normal mood and affect. Normal behavior. Normal judgment and thought content.   DIAGNOSTIC DATA REVIEWED:  BMET    Component Value Date/Time   NA 147 (H) 12/11/2022 1204   NA 144 01/24/2022 0911   K 4.0 12/11/2022 1204   CL 107 12/11/2022 1204   CO2 29 12/11/2022 1204   GLUCOSE 82 12/11/2022 1204   BUN 13 12/11/2022 1204   BUN 10 01/24/2022 0911   CREATININE 1.45 (H) 12/11/2022 1204   CALCIUM 9.7 12/11/2022 1204   GFRNONAA 33 (L) 07/10/2022 1111  GFRNONAA 56 (L) 02/16/2021 0710   GFRNONAA 58 (L) 12/19/2018 1414   GFRAA 74 09/16/2020 1205   GFRAA 67 12/19/2018 1414   Lab Results  Component Value Date   HGBA1C 5.7 (A) 10/22/2022   HGBA1C 6.0 (H) 01/13/2018   No results found for: "INSULIN" CBC    Component Value Date/Time   WBC 3.7 (L) 12/11/2022 1204   RBC 5.18 (H) 12/11/2022 1204   HGB 16.8 (H) 12/11/2022 1204   HGB 15.9 07/14/2021 0918   HCT 50.2 (H) 12/11/2022 1204    HCT 50.1 (H) 07/14/2021 0918   PLT 213 12/11/2022 1204   PLT 240 07/14/2021 0918   MCV 96.9 12/11/2022 1204   MCV 92 07/14/2021 0918   MCH 32.4 12/11/2022 1204   MCHC 33.5 12/11/2022 1204   RDW 13.9 12/11/2022 1204   RDW 14.9 07/14/2021 0918   Iron/TIBC/Ferritin/ %Sat No results found for: "IRON", "TIBC", "FERRITIN", "IRONPCTSAT" Lipid Panel     Component Value Date/Time   CHOL 141 05/09/2022 1544   TRIG 116 05/09/2022 1544   HDL 42 05/09/2022 1544   CHOLHDL 3.4 05/09/2022 1544   CHOLHDL 3.4 09/22/2019 0226   VLDL 19 09/22/2019 0226   LDLCALC 78 05/09/2022 1544   Hepatic Function Panel     Component Value Date/Time   PROT 8.0 12/11/2022 1204   PROT 8.3 03/24/2021 1113   ALBUMIN 4.6 03/24/2022 0815   ALBUMIN 4.9 (H) 03/24/2021 1113   AST 19 12/11/2022 1204   ALT 18 12/11/2022 1204   ALKPHOS 78 03/24/2022 0815   BILITOT 0.7 12/11/2022 1204   BILITOT 0.5 03/24/2021 1113   BILIDIR 0.12 09/20/2017 1202      Component Value Date/Time   TSH 4.15 10/22/2022 0807     Assessment and Plan:   Essential hypertension Continue to follow up with cardiology. Continue medications as directed  Generalized obesity  BMI 33.0-33.9,adult        Obesity Treatment / Action Plan:  Patient will work on garnering support from family and friends to begin weight loss journey. Will work on eliminating or reducing the presence of highly palatable, calorie dense foods in the home. Will complete provided nutritional and psychosocial assessment questionnaire before the next appointment. Will be scheduled for indirect calorimetry to determine resting energy expenditure in a fasting state.  This will allow [place] to create a reduced calorie, high-protein meal plan to promote loss of fat mass while preserving muscle mass.  Obesity Education Performed Today:  She was weighed on the bioimpedance scale and results were discussed and documented in the synopsis.  We discussed obesity as a  disease and the importance of a more detailed evaluation of all the factors contributing to the disease.  We discussed the importance of long term lifestyle changes which include nutrition, exercise and behavioral modifications as well as the importance of customizing this to her specific health and social needs.  We discussed the benefits of reaching a healthier weight to alleviate the symptoms of existing conditions and reduce the risks of the biomechanical, metabolic and psychological effects of obesity.  Claudia Cantrell appears to be in the action stage of change and states they are ready to start intensive lifestyle modifications and behavioral modifications.  30 minutes was spent today on this visit including the above counseling, pre-visit chart review, and post-visit documentation.  Reviewed by clinician on day of visit: allergies, medications, problem list, medical history, surgical history, family history, social history, and previous encounter notes pertinent to obesity  diagnosis.    Ailene Rud Claudia Freiman FNP-C

## 2023-02-20 ENCOUNTER — Encounter: Payer: Self-pay | Admitting: Internal Medicine

## 2023-02-20 ENCOUNTER — Ambulatory Visit: Payer: Medicare PPO | Admitting: Internal Medicine

## 2023-02-20 VITALS — BP 130/82 | HR 80 | Ht 65.0 in | Wt 205.0 lb

## 2023-02-20 DIAGNOSIS — R7303 Prediabetes: Secondary | ICD-10-CM

## 2023-02-20 DIAGNOSIS — E890 Postprocedural hypothyroidism: Secondary | ICD-10-CM | POA: Diagnosis not present

## 2023-02-20 LAB — TSH: TSH: 11.58 u[IU]/mL — ABNORMAL HIGH (ref 0.35–5.50)

## 2023-02-20 LAB — HEMOGLOBIN A1C: Hgb A1c MFr Bld: 6 % (ref 4.6–6.5)

## 2023-02-20 NOTE — Progress Notes (Signed)
Name: Claudia Cantrell  MRN/ DOB: [id number], 1946-09-26    Age/ Sex: 77 y.o., female    PCP: Girtha Rm, NP-C   Reason for Endocrinology Evaluation: Hypothyroidism     Date of Initial Endocrinology Evaluation: 02/20/2023     HPI: [name] is a 77 y.o. female with a past medical history of postoperative hypothyroidism, HTN, dyslipidemia and A.Fib . The patient presented for initial endocrinology clinic visit on 02/20/2023 for consultative assistance with her Hypothyroidism.   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 Stockmann is here for a follow up on postoperative hypothyroidism and Pre-diabetes.    She was seen by rheumatology in September 2023 for gout follow up  She had a follow-up with cardiology on 08/03/2022, continues to be on amiodarone which was started in June 2023 She is S/P cardio ablation 12/2022  Pt has been noted with weight loss  She was started on Ozempic through nephrology 01/2023 She has been referred to the [facility]  3/11th  She continues to work on reducing sugar-sweetened beverages   Denies local neck swelling  Denies constipation or diarrhea   Synthroid 100 mcg daily      HISTORY:  Past Medical History:  Past Medical History:  Diagnosis Date   Arthritis    rheumatoid   CAD (coronary artery disease)    a. 1992 s/p MI and PTCA of unknown vessel;  b. 09/2010 Cath: LM nl, LAD 20p, LCX 61m RCA 150mRPL 20.   Cervical [facility] ([facility]   Chronic kidney disease    Family history of anesthesia complication    daughter has difficulty waking    GERD (gastroesophageal reflux disease)    occ   Gout 10/08/2016   Hyperlipidemia    Hypertensive heart disease     Hypothyroidism    Nonischemic cardiomyopathy ([facility]   a. 07/2016 Echo: EF 40-45%, mild LVH, inferior akinesis, moderately dilated left atrium, trivial AI and MR.   PAF (paroxysmal atrial fibrillation) ([facility]   a. 07/2016 Admitted w/ AF RVR-->CHA2DS2VASc = 5-->Eliquis;  b. 07/2016 successful TEE/DCCV.   Sleep apnea    a. Using CPAP.   Past Surgical History:  Past Surgical History:  Procedure Laterality Date   ABDOMINAL HYSTERECTOMY  1988   ATRIAL FIBRILLATION ABLATION N/A 07/28/2021   Procedure: ATRIAL FIBRILLATION ABLATION;  Surgeon: CaConstance HawMD;  [facility];  Service: Cardiovascular;  Laterality: N/A;   ATRIAL FIBRILLATION ABLATION N/A 12/14/2022   Procedure: ATRIAL FIBRILLATION ABLATION;  Surgeon: CaConstance HawMD;  [facility];  Service: Cardiovascular;  Laterality: N/A;   [place] BIOPSY  09/02/2019   Procedure: BIOPSY;  Surgeon: MaRush LandmarkaTelford Nab MD;  [facility];  Service: Gastroenterology;;   BIOPSY  12/09/2019   Procedure: BIOPSY;  Surgeon: MaIrving Copas MD;  [facility];  Service: Gastroenterology;;   BIOPSY  03/08/2021   Procedure: BIOPSY;  Surgeon: MaIrving Copas MD;  [facility];  Service: Gastroenterology;;   [place] PTCA BY DR CH[place]/A 08/01/2016   Procedure: CARDIOVERSION;  Surgeon: Lelon Perla, MD;  Location: [place];  Service: Cardiovascular;  Laterality: N/A;   CARDIOVERSION N/A 01/11/2020   Procedure: CARDIOVERSION;  Surgeon: Josue Hector, MD;  Location: [facility] ENDOSCOPY;  Service: Cardiovascular;  Laterality: N/A;   CARDIOVERSION N/A 07/08/2020   Procedure: CARDIOVERSION;  Surgeon: [name], MD;  Location: [facility] ENDOSCOPY;  Service: Cardiovascular;  Laterality: N/A;   CARDIOVERSION N/A 05/31/2022   Procedure: CARDIOVERSION;  Surgeon: [name], MD;  Location: [facility]  ENDOSCOPY;  Service: Cardiovascular;  Laterality: N/A;   CARDIOVERSION N/A 07/18/2022   Procedure: CARDIOVERSION;  Surgeon: [name], MD;  Location: [place];  Service: Cardiovascular;  Laterality: N/A;   ENDOSCOPIC MUCOSAL RESECTION N/A 12/09/2019   Procedure: ENDOSCOPIC MUCOSAL RESECTION;  Surgeon: Irving Copas., MD;  [facility];  Service: Gastroenterology;  Laterality: N/A;   ESOPHAGOGASTRODUODENOSCOPY N/A 03/08/2021   Procedure: ESOPHAGOGASTRODUODENOSCOPY (EGD);  Surgeon: Irving Copas., MD;  [facility];  Service: Gastroenterology;  Laterality: N/A;   ESOPHAGOGASTRODUODENOSCOPY (EGD) WITH PROPOFOL N/A 09/02/2019   Procedure: ESOPHAGOGASTRODUODENOSCOPY (EGD) WITH PROPOFOL;  Surgeon: Rush Landmark Telford Nab., MD;  [facility];  Service: Gastroenterology;  Laterality: N/A;   ESOPHAGOGASTRODUODENOSCOPY (EGD) WITH PROPOFOL N/A 12/09/2019   Procedure: ESOPHAGOGASTRODUODENOSCOPY (EGD) WITH PROPOFOL;  Surgeon: Rush Landmark Telford Nab., MD;  [facility];  Service: Gastroenterology;  Laterality: N/A;   EUS N/A 09/02/2019   Procedure: UPPER ENDOSCOPIC ULTRASOUND (EUS) RADIAL;  Surgeon: Irving Copas., MD;  [facility];  Service: Gastroenterology;  Laterality: N/A;  EUS radial/linear   EUS N/A 12/09/2019   Procedure: UPPER ENDOSCOPIC ULTRASOUND (EUS) RADIAL;  Surgeon: Irving Copas., MD;  [facility];  Service: Gastroenterology;  Laterality: N/A;   EUS  12/09/2019   Procedure: UPPER ENDOSCOPIC ULTRASOUND (EUS) LINEAR;  Surgeon: Irving Copas., MD;  [facility];  Service: Gastroenterology;;   EUS N/A 03/08/2021   Procedure: UPPER ENDOSCOPIC ULTRASOUND (EUS) RADIAL;  Surgeon: Irving Copas., MD;  [facility];  Service: Gastroenterology;  Laterality: N/A;   FINE NEEDLE ASPIRATION  09/02/2019   Procedure: FINE NEEDLE ASPIRATION (FNA) LINEAR;  Surgeon:  Irving Copas., MD;  [facility];  Service: Gastroenterology;;   FINE NEEDLE ASPIRATION  12/09/2019   Procedure: FINE NEEDLE ASPIRATION (FNA) LINEAR;  Surgeon: Irving Copas., MD;  [facility];  Service: Gastroenterology;;   HEMOSTASIS CLIP PLACEMENT  12/09/2019   Procedure: HEMOSTASIS CLIP PLACEMENT;  Surgeon: Irving Copas., MD;  [facility];  Service: Gastroenterology;;   ORIF ANKLE FRACTURE Right 04/27/2015   Procedure: OPEN REDUCTION INTERNAL FIXATION (ORIF) RIGHT BIMALLEOLAR ANKLE FRACTURE WITH SYNDESMOSIS FIXATION;  Surgeon: Leandrew Koyanagi, MD;  Location: [place];  Service: Orthopedics;  Laterality: Right;   SUBMUCOSAL LIFTING INJECTION  12/09/2019   Procedure: SUBMUCOSAL LIFTING INJECTION;  Surgeon: Irving Copas., MD;  [facility];  Service: Gastroenterology;;   TEE WITHOUT CARDIOVERSION N/A 08/01/2016   Procedure: TRANSESOPHAGEAL ECHOCARDIOGRAM (TEE);  Surgeon: Lelon Perla, MD;  Location[facility] ENDOSCOPY;  Service: Cardiovascular;  Laterality: N/A;   THYROIDECTOMY  11/24/2013   DR Dalbert Batman   THYROIDECTOMY N/A 11/24/2013   Procedure: TOTAL THYROIDECTOMY;  Surgeon: Adin Hector, MD;  Location: [place];  Service: General;  Laterality: N/A;   TRANSTHORACIC ECHOCARDIOGRAM  01/15/2013   EF 55% TO 65%. PROBABLE MILD HYPOKINESIS OF THE INFERIOR MYOCARDIUM. GRADE 1 DIASTOLIC DYSFUNCTION. TRIAL AR.LA IS MILDLY DILATED.    Social History:  reports that she quit smoking about  32 years ago. Her smoking use included cigarettes. She has a 15.00 pack-year smoking history. She has never been exposed to tobacco smoke. She has never used smokeless tobacco. She reports current alcohol use. She reports that she does not use drugs. Family History: family history includes Bone cancer in her sister; Diabetes in her father; Heart disease in her brother and mother; Melanoma in her brother; Stroke in her father; Sudden death in her brother  and mother.   HOME MEDICATIONS: Allergies as of 02/20/2023       Reactions   Labetalol    headaches   Codeine Anxiety        Medication List        Accurate as of February 20, 2023  9:11 AM. If you have any questions, ask your nurse or doctor.          acetaminophen 500 MG tablet Commonly known as: TYLENOL Take 1,000 mg by mouth every 6 (six) hours as needed for moderate pain.   allopurinol 100 MG tablet Commonly known as: ZYLOPRIM Take 1 tablet (100 mg total) by mouth daily.   amiodarone 200 MG tablet Commonly known as: Pacerone Take 1 tablet (200 mg total) by mouth daily.   amLODipine 5 MG tablet Commonly known as: NORVASC TAKE 1 & 1/2 (ONE & ONE-HALF) TABLETS BY MOUTH IN THE EVENING   carvedilol 12.5 MG tablet Commonly known as: COREG Take 12.5 mg by mouth 2 (two) times daily with a meal.   cyanocobalamin 1000 MCG tablet Commonly known as: VITAMIN B12 Take 1,000 mcg by mouth daily.   Eliquis 5 MG Tabs tablet Generic drug: apixaban Take 1 tablet by mouth twice daily   Farxiga 10 MG Tabs tablet Generic drug: dapagliflozin propanediol Take 10 mg by mouth in the morning.   furosemide 20 MG tablet Commonly known as: LASIX Take 20 mg by mouth daily as needed for edema.   isosorbide dinitrate 5 MG tablet Commonly known as: ISORDIL Take 1 tablet by mouth twice daily   losartan 25 MG tablet Commonly known as: COZAAR Take 25 mg by mouth at bedtime.   OCUVITE ADULT 50+ PO Take by mouth.   olmesartan 40 MG tablet Commonly known as: BENICAR Take 40 mg by mouth daily.   Ozempic (0.25 or 0.5 MG/DOSE) 2 MG/3ML Sopn Generic drug: Semaglutide(0.25 or 0.'5MG'$ /DOS) Inject 0.5 mg into the skin once a week.   PROBIOTIC PO Take 1 capsule by mouth 2 (two) times a week.   rosuvastatin 40 MG tablet Commonly known as: CRESTOR Take 1 tablet by mouth once daily   Synthroid 100 MCG tablet Generic drug: levothyroxine Take 1 tablet (100 mcg total) by mouth daily  before breakfast.   vitamin D3 25 MCG (1000 UT) tablet Generic drug: Cholecalciferol Take 1,000 Units by mouth 2 (two) times a week.          REVIEW OF SYSTEMS: A comprehensive ROS was conducted with the patient and is negative except as per HPI    OBJECTIVE:  VS: BP 130/82 (BP Location: Left Arm, Patient Position: Sitting, Cuff Size: Large)   Pulse 80   Ht '5\' 5"'$  (1.651 m)   Wt 205 lb (93 kg)   SpO2 95%   BMI 34.11 kg/m   Wt Readings from Last 3 Encounters:  02/20/23 205 lb (93 kg)  02/18/23 203 lb (92.1 kg)  01/16/23 213 lb 6.4 oz (96.8 kg)     EXAM: General: Pt appears well and is in NAD  Neck:  General: Supple without adenopathy. Thyroid: Thyroid size normal.  No goiter or nodules appreciated.  Lungs: Clear with good BS bilat with no rales, rhonchi, or wheezes  Heart: Auscultation: RRR.  Abdomen: Normoactive bowel sounds, soft, nontender, without masses or organomegaly palpable  Extremities:  BL LE: No pretibial edema normal ROM and strength.  Mental Status: Judgment, insight: Intact Orientation: Oriented to time, place, and person Mood and affect: No depression, anxiety, or agitation     DATA REVIEWED:   Latest Reference Range & Units 02/20/23 09:22  Hemoglobin A1C 4.6 - 6.5 % 6.0  TSH 0.35 - 5.50 uIU/mL 11.58 (H)  (H): Data is abnormally high  Latest Reference Range & Units 12/11/22 12:04  Sodium 135 - 146 mmol/L 147 (H)  Potassium 3.5 - 5.3 mmol/L 4.0  Chloride 98 - 110 mmol/L 107  CO2 20 - 32 mmol/L 29  Glucose 65 - 99 mg/dL 82  BUN 7 - 25 mg/dL 13  Creatinine 0.60 - 1.00 mg/dL 1.45 (H)  Calcium 8.6 - 10.4 mg/dL 9.7  BUN/Creatinine Ratio 6 - 22 (calc) 9  eGFR > OR = 60 mL/min/1.69m2 38 (L)  AG Ratio 1.0 - 2.5 (calc) 1.6  Uric Acid, Serum 2.5 - 7.0 mg/dL 2.4 (L)  AST 10 - 35 U/L 19  ALT 6 - 29 U/L 18  Total Protein 6.1 - 8.1 g/dL 8.0  Total Bilirubin 0.2 - 1.2 mg/dL 0.7    ASSESSMENT/PLAN/RECOMMENDATIONS:   Postoperative Hypothyroidism     - Pt is  clinically euthyroid  - No local neck symptoms - TSH is elevated, will increase Synthroid as below   Medications :  Stop Synthroid 100 mcg daily  Start Synthroid 112 mcg daily    2. Pre-diabetes :  -A1c has increased from 5.7% to 6.0% -She is on [place] through cardiology -She was started on Ozempic through the weight loss program   F/U 6 months   Signed electronically by: Mack Guise, MD  [facility] Endocrinology  [facility] [address], [place] [address] Phone: [phone] FAX: [phone]   CC: Girtha Rm, NP-C [place] [address] Phone: [phone] Fax: [phone]   Return to Endocrinology clinic as below: Future Appointments  Date Time Provider [place]  03/11/2023 10:00 AM Jearld Lesch A, DO [facility] None  03/13/2023 11:30 AM Constance Haw, MD [facility]  03/25/2023 10:00 AM Jearld Lesch A, DO [facility] None  03/29/2023  2:20 PM Girtha Rm, NP-C [facility] None  06/18/2023  2:00 PM Bo Merino, MD [facility] None  01/27/2024 11:00 AM Truitt Merle, MD [facility] None

## 2023-02-20 NOTE — Patient Instructions (Signed)

## 2023-02-22 ENCOUNTER — Telehealth: Payer: Self-pay | Admitting: Internal Medicine

## 2023-02-22 MED ORDER — SYNTHROID 112 MCG PO TABS: 112.0000 ug | ORAL_TABLET | Freq: Every day | ORAL | 2 refills | Status: DC

## 2023-02-22 NOTE — Telephone Encounter (Signed)
Patient advised and verbalized understanding and will pick up new dose.

## 2023-02-22 NOTE — Telephone Encounter (Signed)
Please let the patient know that her TSH is elevated, which means that the current Synthroid is not enough for her.   Please stop Synthroid 100 mcg daily and start Synthroid 112 mcg daily    Let her know that her A1c is 6.0% which is still within the prediabetes range  She already started following up with the weight loss clinic, and that we will continue to work with her on lifestyle changes

## 2023-02-28 ENCOUNTER — Telehealth: Payer: Self-pay | Admitting: Physician Assistant

## 2023-02-28 NOTE — Telephone Encounter (Signed)
Patient called stating she is experiencing left shoulder pain and would like to schedule an appointment next week with Lovena Le for a cortisone injection.  Patient states Dr. Estanislado Pandy has given her the shoulder injections in the past, but has been taking a lot of Tylenol and doesn't want to wait 2-3 weeks for an appointment. Please advise.

## 2023-02-28 NOTE — Telephone Encounter (Signed)
Dr. Estanislado Pandy will be back in the office in 1 week.  Ok to schedule Felis on my schedule and we can perform a cortisone injection at that time.

## 2023-03-11 ENCOUNTER — Encounter: Payer: Self-pay | Admitting: Bariatrics

## 2023-03-11 ENCOUNTER — Ambulatory Visit (INDEPENDENT_AMBULATORY_CARE_PROVIDER_SITE_OTHER): Payer: Medicare PPO | Admitting: Bariatrics

## 2023-03-11 VITALS — BP 144/75 | HR 54 | Temp 97.9°F | Ht 65.0 in | Wt 200.0 lb

## 2023-03-11 DIAGNOSIS — Z1331 Encounter for screening for depression: Secondary | ICD-10-CM | POA: Diagnosis not present

## 2023-03-11 DIAGNOSIS — E782 Mixed hyperlipidemia: Secondary | ICD-10-CM

## 2023-03-11 DIAGNOSIS — R0602 Shortness of breath: Secondary | ICD-10-CM | POA: Diagnosis not present

## 2023-03-11 DIAGNOSIS — E559 Vitamin D deficiency, unspecified: Secondary | ICD-10-CM | POA: Diagnosis not present

## 2023-03-11 DIAGNOSIS — R5383 Other fatigue: Secondary | ICD-10-CM | POA: Diagnosis not present

## 2023-03-11 DIAGNOSIS — E669 Obesity, unspecified: Secondary | ICD-10-CM

## 2023-03-11 DIAGNOSIS — Z0000 Encounter for general adult medical examination without abnormal findings: Secondary | ICD-10-CM

## 2023-03-11 DIAGNOSIS — R7303 Prediabetes: Secondary | ICD-10-CM | POA: Diagnosis not present

## 2023-03-11 DIAGNOSIS — I10 Essential (primary) hypertension: Secondary | ICD-10-CM

## 2023-03-11 DIAGNOSIS — Z6833 Body mass index (BMI) 33.0-33.9, adult: Secondary | ICD-10-CM

## 2023-03-11 DIAGNOSIS — Z6832 Body mass index (BMI) 32.0-32.9, adult: Secondary | ICD-10-CM | POA: Insufficient documentation

## 2023-03-12 ENCOUNTER — Ambulatory Visit: Payer: Medicare PPO | Attending: Rheumatology | Admitting: Rheumatology

## 2023-03-12 ENCOUNTER — Encounter: Payer: Self-pay | Admitting: Rheumatology

## 2023-03-12 ENCOUNTER — Other Ambulatory Visit: Payer: Self-pay | Admitting: Student

## 2023-03-12 VITALS — BP 129/75 | HR 62 | Resp 16 | Ht 65.0 in | Wt 202.0 lb

## 2023-03-12 DIAGNOSIS — Z7901 Long term (current) use of anticoagulants: Secondary | ICD-10-CM | POA: Diagnosis not present

## 2023-03-12 DIAGNOSIS — M25511 Pain in right shoulder: Secondary | ICD-10-CM | POA: Diagnosis not present

## 2023-03-12 DIAGNOSIS — Z8509 Personal history of malignant neoplasm of other digestive organs: Secondary | ICD-10-CM | POA: Diagnosis not present

## 2023-03-12 DIAGNOSIS — G4733 Obstructive sleep apnea (adult) (pediatric): Secondary | ICD-10-CM | POA: Diagnosis not present

## 2023-03-12 DIAGNOSIS — Z8639 Personal history of other endocrine, nutritional and metabolic disease: Secondary | ICD-10-CM | POA: Diagnosis not present

## 2023-03-12 DIAGNOSIS — R7309 Other abnormal glucose: Secondary | ICD-10-CM

## 2023-03-12 DIAGNOSIS — E790 Hyperuricemia without signs of inflammatory arthritis and tophaceous disease: Secondary | ICD-10-CM

## 2023-03-12 DIAGNOSIS — M25512 Pain in left shoulder: Secondary | ICD-10-CM

## 2023-03-12 DIAGNOSIS — G8929 Other chronic pain: Secondary | ICD-10-CM | POA: Diagnosis not present

## 2023-03-12 DIAGNOSIS — M1A09X0 Idiopathic chronic gout, multiple sites, without tophus (tophi): Secondary | ICD-10-CM

## 2023-03-12 DIAGNOSIS — R260 Ataxic gait: Secondary | ICD-10-CM

## 2023-03-12 DIAGNOSIS — Z8679 Personal history of other diseases of the circulatory system: Secondary | ICD-10-CM

## 2023-03-12 MED ORDER — TRIAMCINOLONE ACETONIDE 40 MG/ML IJ SUSP
40.0000 mg | INTRAMUSCULAR | Status: AC | PRN
  Administered 2023-03-12: 40 mg via INTRA_ARTICULAR

## 2023-03-12 MED ORDER — LIDOCAINE HCL 1 % IJ SOLN
1.5000 mL | INTRAMUSCULAR | Status: AC | PRN
  Administered 2023-03-12: 1.5 mL

## 2023-03-12 NOTE — Progress Notes (Signed)
Office Visit Note  Patient: Claudia Cantrell             Date of Birth: 06-23-1946           MRN: [id number]             PCP: Girtha Rm, NP-C Referring: Girtha Rm, NP-C Visit Date: 03/12/2023 Occupation: @GUAROCC @  Subjective:  Pain of the Right Shoulder and Pain of the Left Shoulder   History of Present Illness: Claudia Cantrell is a 77 y.o. female with history of OT arthropathy and osteoarthritis.  She states she has been having pain and discomfort in her bilateral shoulders and have being difficulty sleeping on her side.  She also has difficulty raising her arm and getting dressed.  She would like to have cortisone injection to her shoulders.  She denies having a gout flare.  She states she resumed allopurinol 100 mg p.o. daily which has been controlling her symptoms well.  None of the other joints are painful.  She states she had ablation for atrial fibrillation and had a good response to it.  She did not take her blood pressure medication this morning.  She states her blood pressure has been running within normal limits.    Activities of Daily Living:  Patient reports morning stiffness for 30 minutes.   Patient Reports nocturnal pain.  Difficulty dressing/grooming: Denies Difficulty climbing stairs: Reports Difficulty getting out of chair: Denies Difficulty using hands for taps, buttons, cutlery, and/or writing: Denies  Review of Systems  Constitutional:  Positive for fatigue.  HENT:  Positive for mouth dryness. Negative for mouth sores.   Eyes:  Positive for dryness.  Respiratory:  Negative for difficulty breathing.   Cardiovascular:  Negative for chest pain and palpitations.  Gastrointestinal:  Negative for blood in stool, constipation and diarrhea.  Endocrine: Negative for increased urination.  Genitourinary:  Negative for involuntary urination.  Musculoskeletal:  Positive for joint pain, joint pain, myalgias, morning stiffness, muscle tenderness and myalgias.  Negative for gait problem, joint swelling and muscle weakness.  Skin:  Positive for hair loss and sensitivity to sunlight. Negative for color change and rash.  Allergic/Immunologic: Negative for susceptible to infections.  Neurological:  Negative for dizziness and headaches.  Hematological:  Negative for swollen glands.  Psychiatric/Behavioral:  Positive for sleep disturbance. Negative for depressed mood. The patient is nervous/anxious.     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 01/27/2022   Acquired hypothyroidism 10/30/2021   Stromal tumor determined by gastric biopsy 06/24/2020   Iatrogenic hyperthyroidism 05/02/2020   Dyslipidemia, goal LDL below 70 05/02/2020   Acute combined systolic and diastolic CHF, NYHA class 3 09/23/2019   Persistent atrial fibrillation 09/21/2019   Submucosal lesion of stomach 07/19/2019   Abnormal CT of the abdomen 07/19/2019   Abnormal CT scan, stomach 06/17/2019   Advance directive declined by patient 01/14/2019   Chronic anticoagulation 01/14/2019   CRI (chronic renal insufficiency), stage 2 (mild) 01/14/2019   Prediabetes 01/14/2019   Persistent proteinuria 02/17/2018   Hyperuricemia 03/26/2017   History of juvenile rheumatoid arthritis  03/26/2017   Vitamin D deficiency 03/26/2017   Medication monitoring encounter 03/26/2017   Gout 10/08/2016   Essential hypertension    CAD S/P percutaneous coronary angioplasty    Nonischemic cardiomyopathy    Chest pain with moderate risk for cardiac etiology  08/02/2016   Closed right ankle fracture 04/24/2015   Ankle fracture, bimalleolar, closed 04/24/2015   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   OSA on CPAP 10/09/2013    Past Medical History:  Diagnosis Date   Arthritis    rheumatoid   Back pain    CAD (coronary artery disease)    a. 1992 s/p MI and PTCA of unknown vessel;  b. 09/2010 Cath: LM nl, LAD 20p, LCX 36m, RCA 33m, RPL 20.   Cancer    Cervical cancer 1977   Chest pain    Chronic kidney disease    Constipation    Family history of anesthesia complication    daughter has difficulty waking    GERD (gastroesophageal reflux disease)    occ   Gout 10/08/2016   History of heart attack    Hyperlipidemia    Hypertensive heart disease    Hypothyroidism    Joint pain    Nonischemic cardiomyopathy    a. 07/2016 Echo: EF 40-45%, mild LVH, inferior akinesis, moderately dilated left atrium, trivial AI and MR.   PAF (paroxysmal atrial fibrillation)    a. 07/2016 Admitted w/ AF RVR-->CHA2DS2VASc = 5-->Eliquis;  b. 07/2016 successful TEE/DCCV.   Pre-diabetes    Sleep apnea    a. Using CPAP.   SOB (shortness of breath)    Vitamin D deficiency     Family History  Problem Relation Age of Onset   Heart disease Mother    Sudden death Mother    Diabetes Father    Stroke Father    Bone cancer Sister    Sudden death Brother    Melanoma Brother    Heart disease Brother    Colon cancer Neg Hx    Esophageal cancer Neg Hx    Inflammatory bowel disease Neg Hx    Liver disease Neg Hx    Pancreatic cancer Neg Hx    Rectal cancer Neg Hx    Stomach cancer Neg Hx    Past Surgical History:  Procedure Laterality Date   ABDOMINAL HYSTERECTOMY  1988   ATRIAL FIBRILLATION ABLATION N/A 07/28/2021   Procedure: ATRIAL FIBRILLATION ABLATION;  Surgeon: Constance Haw, MD;  Location: [place] [facility];  Service: Cardiovascular;  Laterality: N/A;   ATRIAL FIBRILLATION ABLATION N/A 12/14/2022   Procedure: ATRIAL FIBRILLATION ABLATION;   Surgeon: Constance Haw, MD;  Location: [place] [facility];  Service: Cardiovascular;  Laterality: N/A;   [place]   BIOPSY  09/02/2019   Procedure: BIOPSY;  Surgeon: Rush Landmark Telford Nab., MD;  Location: [facility];  Service: Gastroenterology;;   BIOPSY  12/09/2019   Procedure: BIOPSY;  Surgeon: Irving Copas., MD;  Location: [facility];  Service: Gastroenterology;;   BIOPSY  03/08/2021   Procedure: BIOPSY;  Surgeon: Irving Copas., MD;  Location: [facility];  Service: Gastroenterology;;   [place]   PTCA BY DR Clay N/A 08/01/2016   Procedure: CARDIOVERSION;  Surgeon: Lelon Perla, MD;  Location: [facility] ENDOSCOPY;  Service: Cardiovascular;  Laterality: N/A;   CARDIOVERSION N/A 01/11/2020   Procedure: CARDIOVERSION;  Surgeon: Josue Hector, MD;  Location: [place];  Service: Cardiovascular;  Laterality: N/A;  CARDIOVERSION N/A 07/08/2020   Procedure: CARDIOVERSION;  Surgeon: [name], MD;  Location: [facility] ENDOSCOPY;  Service: Cardiovascular;  Laterality: N/A;   CARDIOVERSION N/A 05/31/2022   Procedure: CARDIOVERSION;  Surgeon: [name], MD;  Location: [facility] ENDOSCOPY;  Service: Cardiovascular;  Laterality: N/A;   CARDIOVERSION N/A 07/18/2022   Procedure: CARDIOVERSION;  Surgeon: [name], MD;  Location: [place];  Service: Cardiovascular;  Laterality: N/A;   ENDOSCOPIC MUCOSAL RESECTION N/A 12/09/2019   Procedure: ENDOSCOPIC MUCOSAL RESECTION;  Surgeon: Rush Landmark Telford Nab., MD;  Location: [facility]OS[facility];  Service: Gastroenterology;  Laterality: N/A;   ESOPHAGOGASTRODUODENOSCOPY N/A 03/08/2021   Procedure: ESOPHAGOGASTRODUODENOSCOPY (EGD);  Surgeon: Irving Copas., MD;  Location: [facility];  Service: Gastroenterology;  Laterality: N/A;   ESOPHAGOGASTRODUODENOSCOPY (EGD) WITH PROPOFOL N/A 09/02/2019   Procedure: ESOPHAGOGASTRODUODENOSCOPY (EGD) WITH  PROPOFOL;  Surgeon: Rush Landmark Telford Nab., MD;  Location: [facility];  Service: Gastroenterology;  Laterality: N/A;   ESOPHAGOGASTRODUODENOSCOPY (EGD) WITH PROPOFOL N/A 12/09/2019   Procedure: ESOPHAGOGASTRODUODENOSCOPY (EGD) WITH PROPOFOL;  Surgeon: Rush Landmark Telford Nab., MD;  Location: [facility];  Service: Gastroenterology;  Laterality: N/A;   EUS N/A 09/02/2019   Procedure: UPPER ENDOSCOPIC ULTRASOUND (EUS) RADIAL;  Surgeon: Irving Copas., MD;  Location: [facility];  Service: Gastroenterology;  Laterality: N/A;  EUS radial/linear   EUS N/A 12/09/2019   Procedure: UPPER ENDOSCOPIC ULTRASOUND (EUS) RADIAL;  Surgeon: Irving Copas., MD;  Location: [facility];  Service: Gastroenterology;  Laterality: N/A;   EUS  12/09/2019   Procedure: UPPER ENDOSCOPIC ULTRASOUND (EUS) LINEAR;  Surgeon: Irving Copas., MD;  Location: [facility];  Service: Gastroenterology;;   EUS N/A 03/08/2021   Procedure: UPPER ENDOSCOPIC ULTRASOUND (EUS) RADIAL;  Surgeon: Irving Copas., MD;  Location: [facility];  Service: Gastroenterology;  Laterality: N/A;   FINE NEEDLE ASPIRATION  09/02/2019   Procedure: FINE NEEDLE ASPIRATION (FNA) LINEAR;  Surgeon: Irving Copas., MD;  Location: [facility];  Service: Gastroenterology;;   FINE NEEDLE ASPIRATION  12/09/2019   Procedure: FINE NEEDLE ASPIRATION (FNA) LINEAR;  Surgeon: Irving Copas., MD;  Location: [facility];  Service: Gastroenterology;;   HEMOSTASIS CLIP PLACEMENT  12/09/2019   Procedure: HEMOSTASIS CLIP PLACEMENT;  Surgeon: Irving Copas., MD;  Location: [facility];  Service: Gastroenterology;;   ORIF ANKLE FRACTURE Right 04/27/2015   Procedure: OPEN REDUCTION INTERNAL FIXATION (ORIF) RIGHT BIMALLEOLAR ANKLE FRACTURE WITH SYNDESMOSIS FIXATION;  Surgeon: Leandrew Koyanagi, MD;  Location: [place];  Service: Orthopedics;  Laterality: Right;   SUBMUCOSAL LIFTING INJECTION  12/09/2019   Procedure:  SUBMUCOSAL LIFTING INJECTION;  Surgeon: Irving Copas., MD;  Location: [facility];  Service: Gastroenterology;;   TEE WITHOUT CARDIOVERSION N/A 08/01/2016   Procedure: TRANSESOPHAGEAL ECHOCARDIOGRAM (TEE);  Surgeon: Lelon Perla, MD;  Location: [facility] ENDOSCOPY;  Service: Cardiovascular;  Laterality: N/A;   THYROIDECTOMY  11/24/2013   DR Dalbert Batman   THYROIDECTOMY N/A 11/24/2013   Procedure: TOTAL THYROIDECTOMY;  Surgeon: Adin Hector, MD;  Location: [place];  Service: General;  Laterality: N/A;   TRANSTHORACIC ECHOCARDIOGRAM  01/15/2013   EF 55% TO 65%. PROBABLE MILD HYPOKINESIS OF THE INFERIOR MYOCARDIUM. GRADE 1 DIASTOLIC DYSFUNCTION. TRIAL AR.LA IS MILDLY DILATED.   Social History   Social History Narrative   Right handed    Living  alone.   Immunization History  Administered Date(s) Administered   Fluad Quad(high Dose 65+) 08/22/2019, 09/20/2021   Influenza Nasal 11/25/2013   Influenza, High Dose Seasonal PF 12/30/2014, 10/14/2017, 11/01/2018   Influenza,inj,Quad PF,6+  Mos 11/25/2013   Influenza-Unspecified 12/30/2012, 11/09/2015, 12/24/2016, 09/16/2020   PFIZER(Purple Top)SARS-COV-2 Vaccination 12/29/2019, 01/18/2020, 09/16/2020   Pfizer Covid-19 Vaccine Bivalent Booster 5y-11y 10/21/2021   Pneumococcal Conjugate-13 12/30/2014, 10/14/2017   Pneumococcal Polysaccharide-23 11/25/2013, 08/22/2019   Zoster Recombinat (Shingrix) 10/14/2017, 01/21/2018   Zoster, Live 12/15/2008     Objective: Vital Signs: BP 129/75 (BP Location: Right Arm, Patient Position: Standing, Cuff Size: Normal)   Pulse 62   Resp 16   Ht 5\' 5"  (1.651 m)   Wt 202 lb (91.6 kg)   BMI 33.61 kg/m    Physical Exam Vitals and nursing note reviewed.  Constitutional:      Appearance: She is well-developed.  HENT:     Head: Normocephalic and atraumatic.  Eyes:     Conjunctiva/sclera: Conjunctivae normal.  Cardiovascular:     Rate and Rhythm: Normal rate and regular rhythm.     Heart sounds:  Normal heart sounds.  Pulmonary:     Effort: Pulmonary effort is normal.     Breath sounds: Normal breath sounds.  Abdominal:     General: Bowel sounds are normal.     Palpations: Abdomen is soft.  Musculoskeletal:     Cervical back: Normal range of motion.  Lymphadenopathy:     Cervical: No cervical adenopathy.  Skin:    General: Skin is warm and dry.     Capillary Refill: Capillary refill takes less than 2 seconds.  Neurological:     Mental Status: She is alert and oriented to person, place, and time.  Psychiatric:        Behavior: Behavior normal.      Musculoskeletal Exam: Cervical spine was in good range of motion.  Thoracic kyphosis was noted.  She had painful abduction and internal rotation of her bilateral shoulders.  No effusion was noted.  Elbow joints, wrist joints, MCPs PIPs and DIPs were in good range of motion with no synovitis.  Mild PIP and DIP thickening was noted.  Hip joints and knee joints were in good range of motion.  She had no tenderness over ankles or MTPs.  CDAI Exam: CDAI Score: -- Patient Global: --; Provider Global: -- Swollen: --; Tender: -- Joint Exam 03/12/2023   No joint exam has been documented for this visit   There is currently no information documented on the homunculus. Go to the Rheumatology activity and complete the homunculus joint exam.  Investigation: No additional findings.  Imaging: No results found.  Recent Labs: Lab Results  Component Value Date   WBC 3.7 (L) 12/11/2022   HGB 16.8 (H) 12/11/2022   PLT 213 12/11/2022   NA 147 (H) 12/11/2022   K 4.0 12/11/2022   CL 107 12/11/2022   CO2 29 12/11/2022   GLUCOSE 82 12/11/2022   BUN 13 12/11/2022   CREATININE 1.45 (H) 12/11/2022   BILITOT 0.7 12/11/2022   ALKPHOS 78 03/24/2022   AST 19 12/11/2022   ALT 18 12/11/2022   PROT 8.0 12/11/2022   ALBUMIN 4.6 03/24/2022   CALCIUM 9.7 12/11/2022   GFRAA 74 09/16/2020    Speciality Comments: No specialty comments  available.  Procedures:  Large Joint Inj: bilateral glenohumeral on 03/12/2023 11:11 AM Indications: pain Details: 27 G 1.5 in needle, posterior approach  Arthrogram: No  Medications (Right): 1.5 mL lidocaine 1 %; 40 mg triamcinolone acetonide 40 MG/ML Aspirate (Right): 0 mL Medications (Left): 1.5 mL lidocaine 1 %; 40 mg triamcinolone acetonide 40 MG/ML Aspirate (Left): 0 mL Outcome: tolerated well, no immediate complications  Procedure, treatment alternatives, risks and benefits explained, specific risks discussed. Consent was given by the patient. Immediately prior to procedure a time out was called to verify the correct patient, procedure, equipment, support staff and site/side marked as required. Patient was prepped and draped in the usual sterile fashion.     Allergies: Labetalol and Codeine   Assessment / Plan:     Visit Diagnoses: Idiopathic chronic gout of multiple sites without tophus-patient denies having gout flare.  She has been taking allopurinol 100 mg p.o. daily.  Her last uric acid was 2.4 on December 11, 2022.  Hyperuricemia-uric acid is in desirable range.  Chronic pain of both shoulders-she had pain and discomfort in her bilateral shoulders.  She is having difficulty raising her arms and also putting her clothes on.  X-rays obtained in the past were reviewed.  After informed consent was obtained bilateral glenohumeral joints were injected with lidocaine and Kenalog as described above.  Patient tolerated the procedure well.  Postprocedure instructions were given.  She declined physical therapy.  A handout on exercises was placed in the AVS.  History of hypertension-blood pressure was elevated today at 146/80.  Patient states she did not take her blood pressure medication today.  She was advised to take blood pressure medication on a regular basis.  She was also advised to monitor blood pressure closely and follow-up with her PCP.  Chronic anticoagulation-she remains on  Eliquis for atrial fibrillation.  Other medical problems listed as follows:  History of coronary artery disease  History of atrial fibrillation  History of hyperlipidemia  History of gastrointestinal stromal tumor (GIST)  OSA on CPAP  Elevated hemoglobin A1c  History of vitamin D deficiency  Titubation  Orders: Orders Placed This Encounter  Procedures   Large Joint Inj: bilateral glenohumeral   No orders of the defined types were placed in this encounter.    Follow-Up Instructions: Return in about 6 months (around 09/11/2023) for Gout.   Bo Merino, MD  Note - This record has been created using Editor, commissioning.  Chart creation errors have been sought, but may not always  have been located. Such creation errors do not reflect on  the standard of medical care.

## 2023-03-12 NOTE — Patient Instructions (Signed)

## 2023-03-12 NOTE — Progress Notes (Unsigned)
Chief Complaint:   Claudia Cantrell Cantrell (MR# [id number]) is a 77 y.o. female who presents for evaluation and treatment of Claudia Cantrell and related comorbidities. Current BMI is Body mass index is 33.28 kg/m. Claudia Cantrell Cantrell has been struggling with her weight for many years and has been unsuccessful in either losing weight, maintaining weight loss, or reaching her healthy weight goal.  Claudia Cantrell Cantrell is here for her initial visit.  She was here on 02/18/2023 and met with Everardo Pacific, FNP for an information session.  She craves sweets.  Claudia Cantrell Cantrell is currently in the action stage of change and ready to dedicate time achieving and maintaining a healthier weight. Claudia Cantrell Cantrell is interested in becoming our patient and working on intensive lifestyle modifications including (but not limited to) diet and exercise for weight loss.  Claudia Cantrell Cantrell's habits were reviewed today and are as follows: her desired weight loss is 50 lbs, she has been heavy most of her life, she started gaining weight 10 years ago, her heaviest weight ever was 280 pounds, she has significant food cravings issues, she snacks frequently in the evenings, she is frequently drinking liquids with calories, she frequently makes poor food choices, she frequently eats larger portions than normal, and she struggles with emotional eating.  Depression Screen Claudia Cantrell Cantrell's Food and Mood (modified PHQ-9) score was 18.  Subjective:   1. Other fatigue Claudia Cantrell Cantrell admits to daytime somnolence and admits to waking up still tired. Patient has a history of symptoms of daytime fatigue and morning fatigue. Claudia Cantrell Cantrell generally gets 8 hours of sleep per night, and states that she has nightime awakenings. Snoring is present. Apneic episodes are not present. Epworth Sleepiness Score is 3.  2. SOB (shortness of breath) on exertion Claudia Cantrell notes increasing shortness of breath with exercising and seems to be worsening over time with weight gain. She notes getting out of breath sooner with activity than  she used to. This has not gotten worse recently. Claudia Cantrell Cantrell denies shortness of breath at rest or orthopnea.  3. Prediabetes Patient is taking Ozempic and [place].  She has reactive hypoglycemia.  She has decreased appetite with Ozempic.  Last A1c was 6.0.  4. Essential hypertension Patient is taking Benicar, Cozaar, Coreg.  5. Vitamin D deficiency Patient is taking vitamin D.  6. Health care maintenance Claudia Cantrell.  7. Mixed hyperlipidemia Patient is taking Crestor.  Assessment/Plan:   1. Other fatigue Claudia Cantrell Cantrell does feel that her weight is causing her energy to be lower than it should be. Fatigue may be related to Claudia Cantrell, depression or many other causes. Labs will be ordered, and in the meanwhile, Claudia Cantrell will focus on self care including making healthy food choices, increasing physical activity and focusing on stress reduction.  - TSH+T4F+T3Free  2. SOB (shortness of breath) on exertion Claudia Cantrell Cantrell does feel that she gets out of breath more easily that she used to when she exercises. Claudia Cantrell Cantrell's shortness of breath appears to be Claudia Cantrell related and exercise induced. She has agreed to work on weight loss and gradually increase exercise to treat her exercise induced shortness of breath. Will continue to monitor closely.  - TSH+T4F+T3Free  3. Prediabetes Continue Ozempic.  Check labs today.  4. Essential hypertension Continue medications.  5. Vitamin D deficiency Continue vitamin D.  Check labs today. - VITAMIN D 25 Hydroxy (Vit-D Deficiency, Fractures)  6. Health care maintenance Check labs, IC, EKG today.  - Insulin, random - TSH+T4F+T3Free - VITAMIN D 25 Hydroxy (Vit-D Deficiency, Fractures) - Lipid Panel With LDL/HDL Ratio  7.  Mixed hyperlipidemia Continue Crestor.  Check labs today.  - Lipid Panel With LDL/HDL Ratio  8. Depression screening Claudia Cantrell had a positive depression screening. Depression is commonly associated with Claudia Cantrell and often results in emotional eating behaviors. We  will monitor this closely and work on CBT to help improve the non-hunger eating patterns. Referral to Psychology may be required if no improvement is seen as she continues in our clinic.  9. Generalized Claudia Cantrell  10. BMI 33.0-33.9,adult Claudia Cantrell Cantrell is currently in the action stage of change and her goal is to continue with weight loss efforts. I recommend Claudia Cantrell begin the structured treatment plan as follows:  She has agreed to the Category 1 Plan.  1.  Meal plan. 2.  Mindful eating. 3.  Minimize meal skipping. 4.  Reviewed labs from 12/11/2022, BMP, A1c, CBC. 5.  Decrease weight. 6.  Protein shakes as snacks.   Exercise goals:  As is.     Behavioral modification strategies: increasing lean protein intake, decreasing simple carbohydrates, increasing vegetables, increasing water intake, decreasing eating out, no skipping meals, meal planning and cooking strategies, keeping healthy foods in the home, and planning for success.  She was informed of the importance of frequent follow-up visits to maximize her success with intensive lifestyle modifications for her multiple health conditions. She was informed we would discuss her lab results at her next visit unless there is a critical issue that needs to be addressed sooner. Claudia Cantrell Cantrell agreed to keep her next visit at the agreed upon time to discuss these results.  Objective:   Blood pressure (!) 144/75, pulse (!) 54, temperature 97.9 F (36.6 C), height 5\' 5"  (1.651 m), weight 200 lb (90.7 kg), SpO2 97 %. Body mass index is 33.28 kg/m.  EKG: Normal sinus rhythm, rate 53 bpm.   Indirect Calorimeter completed today shows a VO2 of 162 and a REE of 1109.  Her calculated basal metabolic rate is [id number] thus her basal metabolic rate is worse than expected.  General: Cooperative, alert, well developed, in no acute distress. HEENT: Conjunctivae and lids unremarkable. Cardiovascular: Regular rhythm.  Lungs: Normal work of breathing. Neurologic: No focal deficits.    Lab Results  Component Value Date   CREATININE 1.45 (H) 12/11/2022   BUN 13 12/11/2022   NA 147 (H) 12/11/2022   K 4.0 12/11/2022   CL 107 12/11/2022   CO2 29 12/11/2022   Lab Results  Component Value Date   ALT 18 12/11/2022   AST 19 12/11/2022   ALKPHOS 78 03/24/2022   BILITOT 0.7 12/11/2022   Lab Results  Component Value Date   HGBA1C 6.0 02/20/2023   HGBA1C 5.7 (A) 10/22/2022   HGBA1C 6.1 (H) 05/09/2022   HGBA1C 5.9 (A) 01/15/2022   HGBA1C 6.4 (H) 08/18/2021   No results found for: "INSULIN" Lab Results  Component Value Date   TSH 11.58 (H) 02/20/2023   Lab Results  Component Value Date   CHOL 141 05/09/2022   HDL 42 05/09/2022   LDLCALC 78 05/09/2022   TRIG 116 05/09/2022   CHOLHDL 3.4 05/09/2022   Lab Results  Component Value Date   WBC 3.7 (L) 12/11/2022   HGB 16.8 (H) 12/11/2022   HCT 50.2 (H) 12/11/2022   MCV 96.9 12/11/2022   PLT 213 12/11/2022   No results found for: "IRON", "TIBC", "FERRITIN"  Attestation Statements:   Reviewed by clinician on day of visit: allergies, medications, problem list, medical history, surgical history, family history, social history, and previous encounter notes.  Time spent on visit including pre-visit chart review and post-visit charting and care was  45 minutes.    Delfina Redwood, am acting as Location manager for [employer], DO.  I have reviewed the above documentation for accuracy and completeness, and I agree with the above. Jearld Lesch, DO

## 2023-03-13 ENCOUNTER — Encounter: Payer: Self-pay | Admitting: Cardiology

## 2023-03-13 ENCOUNTER — Encounter (INDEPENDENT_AMBULATORY_CARE_PROVIDER_SITE_OTHER): Payer: Self-pay | Admitting: Bariatrics

## 2023-03-13 ENCOUNTER — Ambulatory Visit: Payer: Medicare PPO | Attending: Cardiology | Admitting: Cardiology

## 2023-03-13 VITALS — BP 148/76 | HR 56 | Ht 65.0 in | Wt 200.6 lb

## 2023-03-13 DIAGNOSIS — G4733 Obstructive sleep apnea (adult) (pediatric): Secondary | ICD-10-CM

## 2023-03-13 DIAGNOSIS — I10 Essential (primary) hypertension: Secondary | ICD-10-CM

## 2023-03-13 DIAGNOSIS — I4819 Other persistent atrial fibrillation: Secondary | ICD-10-CM | POA: Diagnosis not present

## 2023-03-13 DIAGNOSIS — E88819 Insulin resistance, unspecified: Secondary | ICD-10-CM | POA: Insufficient documentation

## 2023-03-13 DIAGNOSIS — D6869 Other thrombophilia: Secondary | ICD-10-CM | POA: Diagnosis not present

## 2023-03-13 LAB — LIPID PANEL WITH LDL/HDL RATIO
Cholesterol, Total: 148 mg/dL (ref 100–199)
HDL: 55 mg/dL (ref >39)
LDL Chol Calc (NIH): 77 mg/dL (ref 0–99)
LDL/HDL Ratio: 1.4 ratio (ref 0.0–3.2)
Triglycerides: 81 mg/dL (ref 0–149)
VLDL Cholesterol Cal: 16 mg/dL (ref 5–40)

## 2023-03-13 LAB — TSH+T4F+T3FREE
Free T4: 2.54 ng/dL — ABNORMAL HIGH (ref 0.82–1.77)
T3, Free: 1.2 pg/mL — ABNORMAL LOW (ref 2.0–4.4)
TSH: 2.25 u[IU]/mL (ref 0.450–4.500)

## 2023-03-13 LAB — INSULIN, RANDOM: INSULIN: 14.2 u[IU]/mL (ref 2.6–24.9)

## 2023-03-13 LAB — VITAMIN D 25 HYDROXY (VIT D DEFICIENCY, FRACTURES): Vit D, 25-Hydroxy: 39.8 ng/mL (ref 30.0–100.0)

## 2023-03-13 MED ORDER — LOSARTAN POTASSIUM 25 MG PO TABS: 25.0000 mg | ORAL_TABLET | Freq: Every day | ORAL | 3 refills | Status: DC

## 2023-03-13 NOTE — Progress Notes (Signed)
Electrophysiology Office Note   Date:  03/13/2023   ID:  Claudia Cantrell, DOB [birth year], MRN [id number]  PCP:  Girtha Rm, NP-C  Cardiologist:  Claiborne Billings Primary Electrophysiologist:  Annalena Piatt Meredith Leeds, MD    Chief Complaint: atrial fibrillation   History of Present Illness: Claudia Cantrell is a 77 y.o. female who is being seen today for the evaluation of atrial fibrillation at the request of Combs, Diona Browner, *. Presenting today for electrophysiology evaluation.  She has a history of significant for coronary artery disease, CKD, hypertension, hyperlipidemia, nonischemic cardiomyopathy, atrial fibrillation.  She is post ablation 07/28/2021 with repeat ablation 12/14/2021.  Since her repeat ablation she has done well.  She has noted no further episodes of atrial fibrillation.  Today, denies symptoms of palpitations, chest pain, shortness of breath, orthopnea, PND, lower extremity edema, claudication, dizziness, presyncope, syncope, bleeding, or neurologic sequela. The patient is tolerating medications without difficulties.     Past Medical History:  Diagnosis Date   Arthritis    rheumatoid   Back pain    CAD (coronary artery disease)    a. 1992 s/p MI and PTCA of unknown vessel;  b. 09/2010 Cath: LM nl, LAD 20p, LCX 15m, RCA 77m, RPL 20.   Cancer    Cervical cancer 1977   Chest pain    Chronic kidney disease    Constipation    Family history of anesthesia complication    daughter has difficulty waking    GERD (gastroesophageal reflux disease)    occ   Gout 10/08/2016   History of heart attack    Hyperlipidemia    Hypertensive heart disease    Hypothyroidism    Joint pain    Nonischemic cardiomyopathy    a. 07/2016 Echo: EF 40-45%, mild LVH, inferior akinesis, moderately dilated left atrium, trivial AI and MR.   PAF (paroxysmal atrial fibrillation)    a. 07/2016 Admitted w/ AF RVR-->CHA2DS2VASc = 5-->Eliquis;  b. 07/2016 successful TEE/DCCV.   Pre-diabetes    Sleep  apnea    a. Using CPAP.   SOB (shortness of breath)    Vitamin D deficiency    Past Surgical History:  Procedure Laterality Date   ABDOMINAL HYSTERECTOMY  1988   ATRIAL FIBRILLATION ABLATION N/A 07/28/2021   Procedure: ATRIAL FIBRILLATION ABLATION;  Surgeon: Constance Haw, MD;  Location: [place] [facility];  Service: Cardiovascular;  Laterality: N/A;   ATRIAL FIBRILLATION ABLATION N/A 12/14/2022   Procedure: ATRIAL FIBRILLATION ABLATION;  Surgeon: Constance Haw, MD;  Location: [place] [facility];  Service: Cardiovascular;  Laterality: N/A;   [place]   BIOPSY  09/02/2019   Procedure: BIOPSY;  Surgeon: Rush Landmark Telford Nab., MD;  Location: [facility];  Service: Gastroenterology;;   BIOPSY  12/09/2019   Procedure: BIOPSY;  Surgeon: Irving Copas., MD;  Location: [facility];  Service: Gastroenterology;;   BIOPSY  03/08/2021   Procedure: BIOPSY;  Surgeon: Irving Copas., MD;  Location: [facility];  Service: Gastroenterology;;   [place]   PTCA BY DR [place] Y N/A 08/01/2016   Procedure: CARDIOVERSION;  Surgeon: Lelon Perla, MD;  Location: [facility] ENDOSCOPY;  Service: Cardiovascular;  Laterality: N/A;   CARDIOVERSION N/A 01/11/2020   Procedure: CARDIOVERSION;  Surgeon: Josue Hector, MD;  Location: [facility] ENDOSCOPY;  Service: Cardiovascular;  Laterality: N/A;   CARDIOVERSION N/A 07/08/2020   Procedure: CARDIOVERSION;  Surgeon: [name], MD;  Location: [facility]  ENDOSCOPY;  Service: Cardiovascular;  Laterality: N/A;   CARDIOVERSION N/A 05/31/2022   Procedure: CARDIOVERSION;  Surgeon: [name], MD;  Location: [facility] ENDOSCOPY;  Service: Cardiovascular;  Laterality: N/A;   CARDIOVERSION N/A 07/18/2022   Procedure: CARDIOVERSION;  Surgeon: [name], MD;  Location: [place];  Service: Cardiovascular;  Laterality: N/A;   ENDOSCOPIC MUCOSAL RESECTION N/A 12/09/2019   Procedure:  ENDOSCOPIC MUCOSAL RESECTION;  Surgeon: Rush Landmark Telford Nab., MD;  Location: [facility] ENDOSCOPY;  Service: Gastroenterology;  Laterality: N/A;   ESOPHAGOGASTRODUODENOSCOPY N/A 03/08/2021   Procedure: ESOPHAGOGASTRODUODENOSCOPY (EGD);  Surgeon: Irving Copas., MD;  Location: [facility];  Service: Gastroenterology;  Laterality: N/A;   ESOPHAGOGASTRODUODENOSCOPY (EGD) WITH PROPOFOL N/A 09/02/2019   Procedure: ESOPHAGOGASTRODUODENOSCOPY (EGD) WITH PROPOFOL;  Surgeon: Rush Landmark Telford Nab., MD;  Location: [facility] ENDOSCOPY;  Service: Gastroenterology;  Laterality: N/A;   ESOPHAGOGASTRODUODENOSCOPY (EGD) WITH PROPOFOL N/A 12/09/2019   Procedure: ESOPHAGOGASTRODUODENOSCOPY (EGD) WITH PROPOFOL;  Surgeon: Rush Landmark Telford Nab., MD;  Location: [facility] ENDOSCOPY;  Service: Gastroenterology;  Laterality: N/A;   EUS N/A 09/02/2019   Procedure: UPPER ENDOSCOPIC ULTRASOUND (EUS) RADIAL;  Surgeon: Irving Copas., MD;  Location: [facility];  Service: Gastroenterology;  Laterality: N/A;  EUS radial/linear   EUS N/A 12/09/2019   Procedure: UPPER ENDOSCOPIC ULTRASOUND (EUS) RADIAL;  Surgeon: Irving Copas., MD;  Location: [facility];  Service: Gastroenterology;  Laterality: N/A;   EUS  12/09/2019   Procedure: UPPER ENDOSCOPIC ULTRASOUND (EUS) LINEAR;  Surgeon: Irving Copas., MD;  Location: [facility];  Service: Gastroenterology;;   EUS N/A 03/08/2021   Procedure: UPPER ENDOSCOPIC ULTRASOUND (EUS) RADIAL;  Surgeon: Irving Copas., MD;  Location: [facility];  Service: Gastroenterology;  Laterality: N/A;   FINE NEEDLE ASPIRATION  09/02/2019   Procedure: FINE NEEDLE ASPIRATION (FNA) LINEAR;  Surgeon: Irving Copas., MD;  Location: [facility];  Service: Gastroenterology;;   FINE NEEDLE ASPIRATION  12/09/2019   Procedure: FINE NEEDLE ASPIRATION (FNA) LINEAR;  Surgeon: Irving Copas., MD;  Location: [facility];  Service: Gastroenterology;;   HEMOSTASIS CLIP  PLACEMENT  12/09/2019   Procedure: HEMOSTASIS CLIP PLACEMENT;  Surgeon: Irving Copas., MD;  Location: [facility];  Service: Gastroenterology;;   ORIF ANKLE FRACTURE Right 04/27/2015   Procedure: OPEN REDUCTION INTERNAL FIXATION (ORIF) RIGHT BIMALLEOLAR ANKLE FRACTURE WITH SYNDESMOSIS FIXATION;  Surgeon: Leandrew Koyanagi, MD;  Location: [place];  Service: Orthopedics;  Laterality: Right;   SUBMUCOSAL LIFTING INJECTION  12/09/2019   Procedure: SUBMUCOSAL LIFTING INJECTION;  Surgeon: Irving Copas., MD;  Location: [facility];  Service: Gastroenterology;;   TEE WITHOUT CARDIOVERSION N/A 08/01/2016   Procedure: TRANSESOPHAGEAL ECHOCARDIOGRAM (TEE);  Surgeon: Lelon Perla, MD;  Location: [facility] ENDOSCOPY;  Service: Cardiovascular;  Laterality: N/A;   THYROIDECTOMY  11/24/2013   DR Dalbert Batman   THYROIDECTOMY N/A 11/24/2013   Procedure: TOTAL THYROIDECTOMY;  Surgeon: Adin Hector, MD;  Location: [place];  Service: General;  Laterality: N/A;   TRANSTHORACIC ECHOCARDIOGRAM  01/15/2013   EF 55% TO 65%. PROBABLE MILD HYPOKINESIS OF THE INFERIOR MYOCARDIUM. GRADE 1 DIASTOLIC DYSFUNCTION. TRIAL AR.LA IS MILDLY DILATED.     Current Outpatient Medications  Medication Sig Dispense Refill   acetaminophen (TYLENOL) 500 MG tablet Take 1,000 mg by mouth every 6 (six) hours as needed for moderate pain.     allopurinol (ZYLOPRIM) 100 MG tablet Take 1 tablet (100 mg total) by mouth daily. 30 tablet 2   amLODipine (NORVASC) 5 MG tablet TAKE 1 & 1/2 (ONE & ONE-HALF) TABLETS BY  MOUTH IN THE EVENING 135 tablet 0   apixaban (ELIQUIS) 5 MG TABS tablet Take 1 tablet by mouth twice daily 180 tablet 1   carvedilol (COREG) 12.5 MG tablet Take 12.5 mg by mouth 2 (two) times daily with a meal.     cyanocobalamin (VITAMIN B12) 1000 MCG tablet Take 1,000 mcg by mouth daily.     FARXIGA 10 MG TABS tablet Take 10 mg by mouth in the morning.     furosemide (LASIX) 20 MG tablet Take 20 mg by mouth daily as needed for  edema.     isosorbide dinitrate (ISORDIL) 5 MG tablet Take 1 tablet by mouth twice daily 180 tablet 0   losartan (COZAAR) 25 MG tablet Take 25 mg by mouth at bedtime.     Multiple Vitamins-Minerals (OCUVITE ADULT 50+ PO) Take by mouth.     olmesartan (BENICAR) 40 MG tablet Take 40 mg by mouth daily.     OZEMPIC, 0.25 OR 0.5 MG/DOSE, 2 MG/3ML SOPN Inject 0.5 mg into the skin once a week.     Probiotic Product (PROBIOTIC PO) Take 1 capsule by mouth 2 (two) times a week.     rosuvastatin (CRESTOR) 40 MG tablet Take 1 tablet by mouth once daily 90 tablet 1   SYNTHROID 112 MCG tablet Take 1 tablet (112 mcg total) by mouth daily before breakfast. 90 tablet 2   Vitamin D3 (VITAMIN D) 25 MCG tablet Take 1,000 Units by mouth 2 (two) times a week.     No current facility-administered medications for this visit.    Allergies:   Labetalol and Codeine   Social History:  The patient  reports that she quit smoking about 32 years ago. Her smoking use included cigarettes. She has a 15.00 pack-year smoking history. She has never been exposed to tobacco smoke. She has never used smokeless tobacco. She reports current alcohol use. She reports that she does not use drugs.   Family History:  The patient's family history includes Bone cancer in her sister; Diabetes in her father; Heart disease in her brother and mother; Melanoma in her brother; Stroke in her father; Sudden death in her brother and mother.   ROS:  Please see the history of present illness.   Otherwise, review of systems is positive for none.   All other systems are reviewed and negative.   PHYSICAL EXAM: VS:  BP (!) 148/76   Pulse (!) 56   Ht 5\' 5"  (1.651 m)   Wt 200 lb 9.6 oz (91 kg)   SpO2 93%   BMI 33.38 kg/m  , BMI Body mass index is 33.38 kg/m. GEN: Well nourished, well developed, in no acute distress  HEENT: normal  Neck: no JVD, carotid bruits, or masses Cardiac: RRR; no murmurs, rubs, or gallops,no edema  Respiratory:  clear to  auscultation bilaterally, normal work of breathing GI: soft, nontender, nondistended, + BS MS: no deformity or atrophy  Skin: warm and dry Neuro:  Strength and sensation are intact Psych: euthymic mood, full affect  EKG:  EKG is ordered today. Personal review of the ekg ordered shows sinus rhythm  Recent Labs: 03/24/2022: B Natriuretic Peptide 161.2; Magnesium 2.6 12/11/2022: ALT 18; BUN 13; Creat 1.45; Hemoglobin 16.8; Platelets 213; Potassium 4.0; Sodium 147 03/11/2023: TSH 2.250    Lipid Panel     Component Value Date/Time   CHOL 148 03/11/2023 1043   TRIG 81 03/11/2023 1043   HDL 55 03/11/2023 1043   CHOLHDL 3.4 05/09/2022 1544  CHOLHDL 3.4 09/22/2019 0226   VLDL 19 09/22/2019 0226   LDLCALC 77 03/11/2023 1043     Wt Readings from Last 3 Encounters:  03/13/23 200 lb 9.6 oz (91 kg)  03/12/23 202 lb (91.6 kg)  03/11/23 200 lb (90.7 kg)      Other studies Reviewed: Additional studies/ records that were reviewed today include: TTE 04/24/21  Review of the above records today demonstrates:   1. Left ventricular ejection fraction, by estimation, is 55 to 60%. The  left ventricle has normal function. The left ventricle has no regional  wall motion abnormalities. There is mild left ventricular hypertrophy.  Left ventricular diastolic parameters  are indeterminate.   2. Right ventricular systolic function is normal. The right ventricular  size is normal.   3. Left atrial size was mildly dilated.   4. The mitral valve is normal in structure. Mild mitral valve  regurgitation. No evidence of mitral stenosis.   5. The aortic valve is normal in structure. Aortic valve regurgitation is  mild. No aortic stenosis is present.   6. The inferior vena cava is normal in size with greater than 50%  respiratory variability, suggesting right atrial pressure of 3 mmHg.    ASSESSMENT AND PLAN:  1.  Persistent atrial fibrillation: Status post ablation 07/28/2021 with repeat ablation  12/14/2021.  CHA2DS2-VASc of 5.  Currently on Eliquis and amiodarone.  She has remained in sinus rhythm since her most recent ablation.  Heavin Sebree stop amiodarone today.  2.  Coronary artery disease: No current chest pain.  Plan per primary cardiology.  3.  Obstructive sleep apnea: CPAP compliance encouraged  4.  Secondary hypercoagulable state: Currently on Eliquis for atrial fibrillation   Current medicines are reviewed at length with the patient today.   The patient does not have concerns regarding her medicines.  The following changes were made today: Stop amiodarone  Labs/ tests ordered today include:  Orders Placed This Encounter  Procedures   EKG 12-Lead       Disposition:   FU 6 months  Signed, Jasir Rother Meredith Leeds, MD  03/13/2023 11:02 AM     [facility] [address] [place] [address] [phone] (office) [phone] (fax)

## 2023-03-13 NOTE — Addendum Note (Signed)
Addended by: Molli Barrows on: 03/13/2023 12:22 PM   Modules accepted: Orders

## 2023-03-13 NOTE — Patient Instructions (Signed)
Medication Instructions:  Your physician has recommended you make the following change in your medication:   1) STOP amiodarone  *If you need a refill on your cardiac medications before your next appointment, please call your pharmacy*  Lab Work: None  Testing/Procedures: None  Follow-Up: At [facility], you and your health needs are our priority.  As part of our continuing mission to provide you with exceptional heart care, we have created designated Provider Care Teams.  These Care Teams include your primary Cardiologist (physician) and Advanced Practice Providers (APPs -  Physician Assistants and Nurse Practitioners) who all work together to provide you with the care you need, when you need it.  Your next appointment:   6 month(s) *call in June to schedule appointment in October**  Provider:   Will Meredith Leeds, MD

## 2023-03-15 DIAGNOSIS — N182 Chronic kidney disease, stage 2 (mild): Secondary | ICD-10-CM | POA: Diagnosis not present

## 2023-03-20 DIAGNOSIS — I480 Paroxysmal atrial fibrillation: Secondary | ICD-10-CM | POA: Diagnosis not present

## 2023-03-20 DIAGNOSIS — N1832 Chronic kidney disease, stage 3b: Secondary | ICD-10-CM | POA: Diagnosis not present

## 2023-03-20 DIAGNOSIS — R809 Proteinuria, unspecified: Secondary | ICD-10-CM | POA: Diagnosis not present

## 2023-03-20 DIAGNOSIS — E559 Vitamin D deficiency, unspecified: Secondary | ICD-10-CM | POA: Diagnosis not present

## 2023-03-20 DIAGNOSIS — E669 Obesity, unspecified: Secondary | ICD-10-CM | POA: Diagnosis not present

## 2023-03-20 DIAGNOSIS — I129 Hypertensive chronic kidney disease with stage 1 through stage 4 chronic kidney disease, or unspecified chronic kidney disease: Secondary | ICD-10-CM | POA: Diagnosis not present

## 2023-03-20 DIAGNOSIS — N2581 Secondary hyperparathyroidism of renal origin: Secondary | ICD-10-CM | POA: Diagnosis not present

## 2023-03-21 DIAGNOSIS — G4733 Obstructive sleep apnea (adult) (pediatric): Secondary | ICD-10-CM | POA: Diagnosis not present

## 2023-03-21 DIAGNOSIS — I10 Essential (primary) hypertension: Secondary | ICD-10-CM | POA: Diagnosis not present

## 2023-03-25 ENCOUNTER — Ambulatory Visit (INDEPENDENT_AMBULATORY_CARE_PROVIDER_SITE_OTHER): Payer: Medicare PPO | Admitting: Bariatrics

## 2023-03-25 ENCOUNTER — Encounter: Payer: Self-pay | Admitting: Bariatrics

## 2023-03-25 VITALS — BP 149/69 | HR 56 | Temp 97.8°F | Ht 65.0 in | Wt 197.0 lb

## 2023-03-25 DIAGNOSIS — E88819 Insulin resistance, unspecified: Secondary | ICD-10-CM

## 2023-03-25 DIAGNOSIS — E559 Vitamin D deficiency, unspecified: Secondary | ICD-10-CM | POA: Diagnosis not present

## 2023-03-25 DIAGNOSIS — E669 Obesity, unspecified: Secondary | ICD-10-CM

## 2023-03-25 DIAGNOSIS — Z6832 Body mass index (BMI) 32.0-32.9, adult: Secondary | ICD-10-CM

## 2023-03-25 MED ORDER — VITAMIN D (ERGOCALCIFEROL) 1.25 MG (50000 UNIT) PO CAPS: 50000.0000 [IU] | ORAL_CAPSULE | ORAL | 0 refills | Status: DC

## 2023-03-26 ENCOUNTER — Encounter: Payer: Self-pay | Admitting: Bariatrics

## 2023-03-26 NOTE — Progress Notes (Signed)
Chief Complaint:   OBESITY Claudia Cantrell is here to discuss her progress with her obesity treatment plan along with follow-up of her obesity related diagnoses. Claudia Cantrell is on the Category 1 Plan and states she is following her eating plan approximately 90% of the time. Claudia Cantrell states she is doing 0 minutes 0 times per week.  Today's visit was #: 2 Starting weight: 200 lbs Starting date: 03/11/2023 Today's weight: 197 lbs Today's date: 03/25/2023 Total lbs lost to date: 3 Total lbs lost since last in-office visit: 3  Interim History: Claudia Cantrell is down 3 pounds since her first visit.  She stays out of the kitchen and her hunger has been up.  Subjective:   1. Insulin resistance Claudia Cantrell's recent insulin level was 14.2.  2. Vitamin D insufficiency Claudia Cantrell's recent vitamin D level was 39.8.  She notes more energy in the morning.  Assessment/Plan:   1. Insulin resistance Handouts on insulin resistance and prediabetes were given to the patient today.  Claudia Cantrell will use protein shakes in her office.  2. Vitamin D insufficiency Claudia Cantrell agreed to start prescription vitamin D 50,000 IU once weekly with no refills.  Goal vitamin D level is 50.  - Vitamin D, Ergocalciferol, (DRISDOL) 1.25 MG (50000 UNIT) CAPS capsule; Take 1 capsule (50,000 Units total) by mouth every 7 (seven) days.  Dispense: 5 capsule; Refill: 0  3. Generalized obesity  4. BMI 32.0-32.9,adult Claudia Cantrell is currently in the action stage of change. As such, her goal is to continue with weight loss efforts. She has agreed to the Category 1 Plan.   Meal planning was discussed.  Review labs with the patient from 03/21/2023, insulin, thyroid panel, lipid.  Increase water intake.  Protein cereal was discussed.  Exercise goals: No exercise has been prescribed at this time.  Behavioral modification strategies: increasing lean protein intake, decreasing simple carbohydrates, increasing vegetables, increasing water intake, decreasing eating out, no  skipping meals, meal planning and cooking strategies, keeping healthy foods in the home, and planning for success.  Claudia Cantrell has agreed to follow-up with our clinic in 2 weeks. She was informed of the importance of frequent follow-up visits to maximize her success with intensive lifestyle modifications for her multiple health conditions.   Objective:   Blood pressure (!) 149/69, pulse (!) 56, temperature 97.8 F (36.6 C), height 5\' 5"  (1.651 m), weight 197 lb (89.4 kg), SpO2 97 %. Body mass index is 32.78 kg/m.  General: Cooperative, alert, well developed, in no acute distress. HEENT: Conjunctivae and lids unremarkable. Cardiovascular: Regular rhythm.  Lungs: Normal work of breathing. Neurologic: No focal deficits.   Lab Results  Component Value Date   CREATININE 1.45 (H) 12/11/2022   BUN 13 12/11/2022   NA 147 (H) 12/11/2022   K 4.0 12/11/2022   CL 107 12/11/2022   CO2 29 12/11/2022   Lab Results  Component Value Date   ALT 18 12/11/2022   AST 19 12/11/2022   ALKPHOS 78 03/24/2022   BILITOT 0.7 12/11/2022   Lab Results  Component Value Date   HGBA1C 6.0 02/20/2023   HGBA1C 5.7 (A) 10/22/2022   HGBA1C 6.1 (H) 05/09/2022   HGBA1C 5.9 (A) 01/15/2022   HGBA1C 6.4 (H) 08/18/2021   Lab Results  Component Value Date   INSULIN 14.2 03/11/2023   Lab Results  Component Value Date   TSH 2.250 03/11/2023   Lab Results  Component Value Date   CHOL 148 03/11/2023   HDL 55 03/11/2023   LDLCALC 77 03/11/2023  TRIG 81 03/11/2023   CHOLHDL 3.4 05/09/2022   Lab Results  Component Value Date   VD25OH 39.8 03/11/2023   VD25OH 55.8 05/09/2022   VD25OH 53.7 04/21/2020   Lab Results  Component Value Date   WBC 3.7 (L) 12/11/2022   HGB 16.8 (H) 12/11/2022   HCT 50.2 (H) 12/11/2022   MCV 96.9 12/11/2022   PLT 213 12/11/2022   No results found for: "IRON", "TIBC", "FERRITIN"  Attestation Statements:   Reviewed by clinician on day of visit: allergies, medications,  problem list, medical history, surgical history, family history, social history, and previous encounter notes.   Trude Mcburney, am acting as Energy manager for [employer], DO.  I have reviewed the above documentation for accuracy and completeness, and I agree with the above. -   Current Outpatient Medications  Medication Sig Dispense Refill   acetaminophen (TYLENOL) 500 MG tablet Take 1,000 mg by mouth every 6 (six) hours as needed for moderate pain.     allopurinol (ZYLOPRIM) 100 MG tablet Take 1 tablet (100 mg total) by mouth daily. 30 tablet 2   amLODipine (NORVASC) 5 MG tablet TAKE 1 & 1/2 (ONE & ONE-HALF) TABLETS BY MOUTH IN THE EVENING 135 tablet 0   apixaban (ELIQUIS) 5 MG TABS tablet Take 1 tablet by mouth twice daily 180 tablet 1   carvedilol (COREG) 12.5 MG tablet Take 12.5 mg by mouth 2 (two) times daily with a meal.     cyanocobalamin (VITAMIN B12) 1000 MCG tablet Take 1,000 mcg by mouth daily.     FARXIGA 10 MG TABS tablet Take 10 mg by mouth in the morning.     furosemide (LASIX) 20 MG tablet Take 20 mg by mouth daily as needed for edema.     isosorbide dinitrate (ISORDIL) 5 MG tablet Take 1 tablet by mouth twice daily 180 tablet 0   losartan (COZAAR) 25 MG tablet Take 1 tablet (25 mg total) by mouth at bedtime. 90 tablet 3   Multiple Vitamins-Minerals (HAIR SKIN NAILS PO) Take by mouth.     Multiple Vitamins-Minerals (OCUVITE ADULT 50+ PO) Take by mouth.     olmesartan (BENICAR) 40 MG tablet Take 40 mg by mouth daily.     OZEMPIC, 0.25 OR 0.5 MG/DOSE, 2 MG/3ML SOPN Inject 0.5 mg into the skin once a week.     Probiotic Product (CULTURELLE METABOLISM-WEIGHT) CAPS Take by mouth.     Probiotic Product (PROBIOTIC PO) Take 1 capsule by mouth 2 (two) times a week.     rosuvastatin (CRESTOR) 40 MG tablet Take 1 tablet by mouth once daily 90 tablet 1   SYNTHROID 112 MCG tablet Take 1 tablet (112 mcg total) by mouth daily before breakfast. 90 tablet 2   Vitamin D,  Ergocalciferol, (DRISDOL) 1.25 MG (50000 UNIT) CAPS capsule Take 1 capsule (50,000 Units total) by mouth every 7 (seven) days. 5 capsule 0   Vitamin D3 (VITAMIN D) 25 MCG tablet Take 1,000 Units by mouth 2 (two) times a week.     No current facility-administered medications for this visit.

## 2023-03-29 ENCOUNTER — Ambulatory Visit (INDEPENDENT_AMBULATORY_CARE_PROVIDER_SITE_OTHER): Payer: Medicare PPO | Admitting: Family Medicine

## 2023-03-29 ENCOUNTER — Encounter: Payer: Self-pay | Admitting: Family Medicine

## 2023-03-29 VITALS — BP 148/84 | HR 55 | Temp 97.5°F | Ht 65.0 in | Wt 200.0 lb

## 2023-03-29 DIAGNOSIS — E559 Vitamin D deficiency, unspecified: Secondary | ICD-10-CM | POA: Diagnosis not present

## 2023-03-29 DIAGNOSIS — R7303 Prediabetes: Secondary | ICD-10-CM | POA: Diagnosis not present

## 2023-03-29 DIAGNOSIS — I10 Essential (primary) hypertension: Secondary | ICD-10-CM

## 2023-03-29 NOTE — Progress Notes (Signed)
Subjective:     Patient ID: Claudia Cantrell, female    DOB: Mar 09, 1946, 77 y.o.   MRN: [id number]  Chief Complaint  Patient presents with   Medical Management of Chronic Issues    3 month f/u    HPI Patient is in today for follow up.   She has been seeing [facility]. Losing weight and feeling better.  Under the care of specialists.   No new concerns today.   Health Maintenance Due  Topic Date Due   DTaP/Tdap/Td (1 - Tdap) Never done   COLONOSCOPY (Pts 45-72yrs Insurance coverage will need to be confirmed)  Never done   [employer] (AW[facility])  05/10/2023    Past Medical History:  Diagnosis Date   Arthritis    rheumatoid   Back pain    CAD (coronary artery disease)    a. 1992 s/p MI and PTCA of unknown vessel;  b. 09/2010 Cath: LM nl, LAD 20p, LCX 43m, RCA 45m, RPL 20.   Cancer    Cervical cancer 1977   Chest pain    Chronic kidney disease    Constipation    Family history of anesthesia complication    daughter has difficulty waking    GERD (gastroesophageal reflux disease)    occ   Gout 10/08/2016   History of heart attack    Hyperlipidemia    Hypertensive heart disease    Hypothyroidism    Joint pain    Nonischemic cardiomyopathy    a. 07/2016 Echo: EF 40-45%, mild LVH, inferior akinesis, moderately dilated left atrium, trivial AI and MR.   PAF (paroxysmal atrial fibrillation)    a. 07/2016 Admitted w/ AF RVR-->CHA2DS2VASc = 5-->Eliquis;  b. 07/2016 successful TEE/DCCV.   Pre-diabetes    Sleep apnea    a. Using CPAP.   SOB (shortness of breath)    Vitamin D deficiency     Past Surgical History:  Procedure Laterality Date   ABDOMINAL HYSTERECTOMY  1988   ATRIAL FIBRILLATION ABLATION N/A 07/28/2021   Procedure: ATRIAL FIBRILLATION ABLATION;  Surgeon: Regan Lemming, MD;  Location: [facility];  Service: Cardiovascular;  Laterality: N/A;   ATRIAL FIBRILLATION ABLATION N/A 12/14/2022   Procedure: ATRIAL FIBRILLATION ABLATION;  Surgeon: Regan Lemming, MD;  Location: [facility];  Service: Cardiovascular;  Laterality: N/A;   BACK SURGERY  1994   BIOPSY  09/02/2019   Procedure: BIOPSY;  Surgeon: Meridee Score Netty Starring., MD;  Location: [facility];  Service: Gastroenterology;;   BIOPSY  12/09/2019   Procedure: BIOPSY;  Surgeon: Lemar Lofty., MD;  Location: [facility];  Service: Gastroenterology;;   BIOPSY  03/08/2021   Procedure: BIOPSY;  Surgeon: Lemar Lofty., MD;  Location: [facility];  Service: Gastroenterology;;   CARDIAC CATHETERIZATION  1991 AND 1992   PTCA BY DR Meryl Crutch   CARDIOVERSION N/A 08/01/2016   Procedure: CARDIOVERSION;  Surgeon: Lewayne Bunting, MD;  Location: [facility] ENDOSCOPY;  Service: Cardiovascular;  Laterality: N/A;   CARDIOVERSION N/A 01/11/2020   Procedure: CARDIOVERSION;  Surgeon: Wendall Stade, MD;  Location: [facility] ENDOSCOPY;  Service: Cardiovascular;  Laterality: N/A;   CARDIOVERSION N/A 07/08/2020   Procedure: CARDIOVERSION;  Surgeon: Little Ishikawa, MD;  Location: [facility] ENDOSCOPY;  Service: Cardiovascular;  Laterality: N/A;   CARDIOVERSION N/A 05/31/2022   Procedure: CARDIOVERSION;  Surgeon: Chrystie Nose, MD;  Location: [facility] ENDOSCOPY;  Service: Cardiovascular;  Laterality: N/A;   CARDIOVERSION N/A 07/18/2022   Procedure: CARDIOVERSION;  Surgeon:  Little Ishikawa, MD;  Location: [facility] ENDOSCOPY;  Service: Cardiovascular;  Laterality: N/A;   ENDOSCOPIC MUCOSAL RESECTION N/A 12/09/2019   Procedure: ENDOSCOPIC MUCOSAL RESECTION;  Surgeon: Meridee Score Netty Starring., MD;  Location: [facility];  Service: Gastroenterology;  Laterality: N/A;   ESOPHAGOGASTRODUODENOSCOPY N/A 03/08/2021   Procedure: ESOPHAGOGASTRODUODENOSCOPY (EGD);  Surgeon: Lemar Lofty., MD;  Location: [facility];  Service: Gastroenterology;  Laterality: N/A;   ESOPHAGOGASTRODUODENOSCOPY (EGD) WITH PROPOFOL N/A 09/02/2019   Procedure: ESOPHAGOGASTRODUODENOSCOPY (EGD) WITH PROPOFOL;  Surgeon:  Meridee Score Netty Starring., MD;  Location: [facility] ENDOSCOPY;  Service: Gastroenterology;  Laterality: N/A;   ESOPHAGOGASTRODUODENOSCOPY (EGD) WITH PROPOFOL N/A 12/09/2019   Procedure: ESOPHAGOGASTRODUODENOSCOPY (EGD) WITH PROPOFOL;  Surgeon: Meridee Score Netty Starring., MD;  Location: [facility] ENDOSCOPY;  Service: Gastroenterology;  Laterality: N/A;   EUS N/A 09/02/2019   Procedure: UPPER ENDOSCOPIC ULTRASOUND (EUS) RADIAL;  Surgeon: Lemar Lofty., MD;  Location: [facility] ENDOSCOPY;  Service: Gastroenterology;  Laterality: N/A;  EUS radial/linear   EUS N/A 12/09/2019   Procedure: UPPER ENDOSCOPIC ULTRASOUND (EUS) RADIAL;  Surgeon: Lemar Lofty., MD;  Location: [facility] ENDOSCOPY;  Service: Gastroenterology;  Laterality: N/A;   EUS  12/09/2019   Procedure: UPPER ENDOSCOPIC ULTRASOUND (EUS) LINEAR;  Surgeon: Lemar Lofty., MD;  Location: [facility];  Service: Gastroenterology;;   EUS N/A 03/08/2021   Procedure: UPPER ENDOSCOPIC ULTRASOUND (EUS) RADIAL;  Surgeon: Lemar Lofty., MD;  Location: [facility];  Service: Gastroenterology;  Laterality: N/A;   FINE NEEDLE ASPIRATION  09/02/2019   Procedure: FINE NEEDLE ASPIRATION (FNA) LINEAR;  Surgeon: Lemar Lofty., MD;  Location: [facility] ENDOSCOPY;  Service: Gastroenterology;;   FINE NEEDLE ASPIRATION  12/09/2019   Procedure: FINE NEEDLE ASPIRATION (FNA) LINEAR;  Surgeon: Lemar Lofty., MD;  Location: [facility]COPY;  Service: Gastroenterology;;   HEMOSTASIS CLIP PLACEMENT  12/09/2019   Procedure: HEMOSTASIS CLIP PLACEMENT;  Surgeon: Lemar Lofty., MD;  Location: [facility]COPY;  Service: Gastroenterology;;   ORIF ANKLE FRACTURE Right 04/27/2015   Procedure: OPEN REDUCTION INTERNAL FIXATION (ORIF) RIGHT BIMALLEOLAR ANKLE FRACTURE WITH SYNDESMOSIS FIXATION;  Surgeon: Tarry Kos, MD;  Location: [facility] OR;  Service: Orthopedics;  Laterality: Right;   SUBMUCOSAL LIFTING INJECTION  12/09/2019   Procedure: SUBMUCOSAL LIFTING  INJECTION;  Surgeon: Lemar Lofty., MD;  Location: [facility] ENDOSCOPY;  Service: Gastroenterology;;   TEE WITHOUT CARDIOVERSION N/A 08/01/2016   Procedure: TRANSESOPHAGEAL ECHOCARDIOGRAM (TEE);  Surgeon: Lewayne Bunting, MD;  Location: [facility] ENDOSCOPY;  Service: Cardiovascular;  Laterality: N/A;   THYROIDECTOMY  11/24/2013   DR Derrell Lolling   THYROIDECTOMY N/A 11/24/2013   Procedure: TOTAL THYROIDECTOMY;  Surgeon: Ernestene Mention, MD;  Location: [facility] OR;  Service: General;  Laterality: N/A;   TRANSTHORACIC ECHOCARDIOGRAM  01/15/2013   EF 55% TO 65%. PROBABLE MILD HYPOKINESIS OF THE INFERIOR MYOCARDIUM. GRADE 1 DIASTOLIC DYSFUNCTION. TRIAL AR.LA IS MILDLY DILATED.    Family History  Problem Relation Age of Onset   Heart disease Mother    Sudden death Mother    Diabetes Father    Stroke Father    Bone cancer Sister    Sudden death Brother    Melanoma Brother    Heart disease Brother    Colon cancer Neg Hx    Esophageal cancer Neg Hx    Inflammatory bowel disease Neg Hx    Liver disease Neg Hx    Pancreatic cancer Neg Hx    Rectal cancer Neg Hx    Stomach cancer Neg Hx     Social History  Socioeconomic History   Marital status: Widowed    Spouse name: Not on file   Number of children: 3   Years of education: Not on file   Highest education level: Not on file  Occupational History   Occupation: retired  Tobacco Use   Smoking status: Former    Packs/day: 1.00    Years: 15.00    Additional pack years: 0.00    Total pack years: 15.00    Types: Cigarettes    Quit date: 12/10/1990    Years since quitting: 32.3    Passive exposure: Never   Smokeless tobacco: Never   Tobacco comments:    Former smoker 05/21/22  Vaping Use   Vaping Use: Never used  Substance and Sexual Activity   Alcohol use: Yes    Comment: very rare   Drug use: No   Sexual activity: Not on file  Other Topics Concern   Not on file  Social History Narrative   Right handed    Living  alone.   Social  Determinants of Health   Financial Resource Strain: Not on file  Food Insecurity: Not on file  Transportation Needs: Not on file  Physical Activity: Not on file  Stress: Not on file  Social Connections: Not on file  Intimate Partner Violence: Not on file    Outpatient Medications Prior to Visit  Medication Sig Dispense Refill   acetaminophen (TYLENOL) 500 MG tablet Take 1,000 mg by mouth every 6 (six) hours as needed for moderate pain.     allopurinol (ZYLOPRIM) 100 MG tablet Take 1 tablet (100 mg total) by mouth daily. 30 tablet 2   amLODipine (NORVASC) 5 MG tablet TAKE 1 & 1/2 (ONE & ONE-HALF) TABLETS BY MOUTH IN THE EVENING 135 tablet 0   apixaban (ELIQUIS) 5 MG TABS tablet Take 1 tablet by mouth twice daily 180 tablet 1   carvedilol (COREG) 12.5 MG tablet Take 12.5 mg by mouth 2 (two) times daily with a meal.     cyanocobalamin (VITAMIN B12) 1000 MCG tablet Take 1,000 mcg by mouth daily.     FARXIGA 10 MG TABS tablet Take 10 mg by mouth in the morning.     furosemide (LASIX) 20 MG tablet Take 20 mg by mouth daily as needed for edema.     isosorbide dinitrate (ISORDIL) 5 MG tablet Take 1 tablet by mouth twice daily 180 tablet 0   Multiple Vitamins-Minerals (HAIR SKIN NAILS PO) Take by mouth.     Multiple Vitamins-Minerals (OCUVITE ADULT 50+ PO) Take by mouth.     olmesartan (BENICAR) 40 MG tablet Take 40 mg by mouth daily.     OZEMPIC, 0.25 OR 0.5 MG/DOSE, 2 MG/3ML SOPN Inject 0.5 mg into the skin once a week.     Probiotic Product (PROBIOTIC PO) Take 1 capsule by mouth 2 (two) times a week.     rosuvastatin (CRESTOR) 40 MG tablet Take 1 tablet by mouth once daily 90 tablet 1   SYNTHROID 112 MCG tablet Take 1 tablet (112 mcg total) by mouth daily before breakfast. 90 tablet 2   UNABLE TO FIND Med Name: Advanced Metabolism Booster     Vitamin D, Ergocalciferol, (DRISDOL) 1.25 MG (50000 UNIT) CAPS capsule Take 1 capsule (50,000 Units total) by mouth every 7 (seven) days. 5 capsule 0    Vitamin D3 (VITAMIN D) 25 MCG tablet Take 1,000 Units by mouth 2 (two) times a week.     losartan (COZAAR) 25 MG tablet Take 1  tablet (25 mg total) by mouth at bedtime. (Patient not taking: Reported on 03/29/2023) 90 tablet 3   Probiotic Product (CULTURELLE METABOLISM-WEIGHT) CAPS Take by mouth.     No facility-administered medications prior to visit.    Allergies  Allergen Reactions   Labetalol     headaches   Codeine Anxiety    Review of Systems  Constitutional:  Negative for chills and fever.  Respiratory:  Negative for shortness of breath.   Cardiovascular:  Negative for chest pain, palpitations and leg swelling.  Gastrointestinal:  Negative for abdominal pain, constipation, diarrhea, nausea and vomiting.  Genitourinary:  Negative for dysuria, frequency and urgency.  Neurological:  Negative for dizziness.       Objective:    Physical Exam Constitutional:      General: She is not in acute distress.    Appearance: She is not ill-appearing.  Eyes:     Extraocular Movements: Extraocular movements intact.     Conjunctiva/sclera: Conjunctivae normal.  Cardiovascular:     Rate and Rhythm: Normal rate.  Pulmonary:     Effort: Pulmonary effort is normal.  Musculoskeletal:     Cervical back: Normal range of motion and neck supple.  Skin:    General: Skin is warm and dry.  Neurological:     General: No focal deficit present.     Mental Status: She is alert and oriented to person, place, and time.  Psychiatric:        Mood and Affect: Mood normal.        Behavior: Behavior normal.        Thought Content: Thought content normal.     BP (!) 148/84 (BP Location: Left Arm, Patient Position: Sitting, Cuff Size: Large)   Pulse (!) 55   Temp (!) 97.5 F (36.4 C) (Temporal)   Ht  (1.651 m)   Wt 200 lb (90.7 kg)   SpO2 98%   BMI 33.28 kg/m  Wt Readings from Last 3 Encounters:  03/29/23 200 lb (90.7 kg)  03/25/23 197 lb (89.4 kg)  03/13/23 200 lb 9.6 oz (91 kg)        Assessment & Plan:   Problem List Items Addressed This Visit       Cardiovascular and Mediastinum   Essential hypertension - Primary     Other   Prediabetes   Vitamin D deficiency   Continue current medications. Monitor BP at home.  She will be in close follow up with [facility] team and specialists.  Discussed stopping high dose Biotin supplement due to interaction with thyroid medication. Recommend she continue MVI.    I have discontinued Zyia Kaneko. Sagun's losartan and Culturelle Metabolism-Weight. I am also having her maintain her vitamin D3, Farxiga, Probiotic Product (PROBIOTIC PO), furosemide, acetaminophen, allopurinol, Eliquis, rosuvastatin, olmesartan, carvedilol, cyanocobalamin, Multiple Vitamins-Minerals (OCUVITE ADULT 50+ PO), amLODipine, isosorbide dinitrate, Ozempic (0.25 or 0.5 MG/DOSE), Synthroid, Multiple Vitamins-Minerals (HAIR SKIN NAILS PO), Vitamin D (Ergocalciferol), and UNABLE TO FIND.  No orders of the defined types were placed in this encounter.

## 2023-03-29 NOTE — Patient Instructions (Signed)
Keep up the good work

## 2023-04-08 ENCOUNTER — Other Ambulatory Visit: Payer: Self-pay

## 2023-04-08 ENCOUNTER — Telehealth: Payer: Self-pay

## 2023-04-08 DIAGNOSIS — K319 Disease of stomach and duodenum, unspecified: Secondary | ICD-10-CM

## 2023-04-08 DIAGNOSIS — C49A2 Gastrointestinal stromal tumor of stomach: Secondary | ICD-10-CM

## 2023-04-08 DIAGNOSIS — C49A0 Gastrointestinal stromal tumor, unspecified site: Secondary | ICD-10-CM

## 2023-04-08 NOTE — Telephone Encounter (Signed)
Patient has been scheduled for office visit 06/28/23 at 2:30 pm with Dr Rhetta Mura. Patient has been scheduled for EUS on 07/08/23 @ [facility]. Patient will be given instructions at office visit. I will send clearance to hold Eliquis after patient has been seen at her visit.

## 2023-04-20 DIAGNOSIS — I10 Essential (primary) hypertension: Secondary | ICD-10-CM | POA: Diagnosis not present

## 2023-04-20 DIAGNOSIS — G4733 Obstructive sleep apnea (adult) (pediatric): Secondary | ICD-10-CM | POA: Diagnosis not present

## 2023-04-22 ENCOUNTER — Ambulatory Visit (INDEPENDENT_AMBULATORY_CARE_PROVIDER_SITE_OTHER): Payer: Medicare PPO | Admitting: Bariatrics

## 2023-04-22 ENCOUNTER — Encounter: Payer: Self-pay | Admitting: Bariatrics

## 2023-04-22 VITALS — BP 125/74 | HR 58 | Temp 97.9°F | Ht 65.0 in | Wt 196.0 lb

## 2023-04-22 DIAGNOSIS — I10 Essential (primary) hypertension: Secondary | ICD-10-CM | POA: Diagnosis not present

## 2023-04-22 DIAGNOSIS — Z6832 Body mass index (BMI) 32.0-32.9, adult: Secondary | ICD-10-CM | POA: Diagnosis not present

## 2023-04-22 DIAGNOSIS — E669 Obesity, unspecified: Secondary | ICD-10-CM

## 2023-04-22 DIAGNOSIS — E559 Vitamin D deficiency, unspecified: Secondary | ICD-10-CM | POA: Diagnosis not present

## 2023-04-22 MED ORDER — VITAMIN D (ERGOCALCIFEROL) 1.25 MG (50000 UNIT) PO CAPS: 50000.0000 [IU] | ORAL_CAPSULE | ORAL | 0 refills | Status: DC

## 2023-04-23 NOTE — Progress Notes (Unsigned)
Chief Complaint:   OBESITY Natyia is here to discuss her progress with her obesity treatment plan along with follow-up of her obesity related diagnoses. Ashrita is on the Category 1 Plan and states she is following her eating plan approximately 90% of the time. Atyana states she is doing 0 minutes 0 times per week.  Today's visit was #: 3 Starting weight: 200 lbs Starting date: 03/11/2023 Today's weight: 196 lbs Today's date: 04/22/2023 Total lbs lost to date: 4 Total lbs lost since last in-office visit: 1  Interim History: Bri is down 1 pound since her last visit.  She was doing well.  She is drinking her protein shake.  She has not had enough water.  She had 2 slices of cake.  Subjective:   1. Vitamin D insufficiency Jahnya is taking vitamin D as directed.  2. Essential hypertension Aviyanna is taking Benicar, Isordil, Coreg, and Norvasc.  Her blood pressure is controlled.  Assessment/Plan:   1. Vitamin D insufficiency We will refill prescription vitamin D for 1 month.  - Vitamin D, Ergocalciferol, (DRISDOL) 1.25 MG (50000 UNIT) CAPS capsule; Take 1 capsule (50,000 Units total) by mouth every 7 (seven) days.  Dispense: 5 capsule; Refill: 0  2. Essential hypertension Sajdah will continue her medications, and we discussed salt substitutions.  3. Generalized obesity  4. BMI 32.0-32.9,adult Latifah is currently in the action stage of change. As such, her goal is to continue with weight loss efforts. She has agreed to the Category 1 Plan.   Meal planning and intentional eating were discussed.  Low calorie 50-calories per glass.  She will minimize all candies and cakes in the home, and will not skip meals.  Exercise goals: No exercise has been prescribed at this time.  Behavioral modification strategies: increasing lean protein intake, decreasing simple carbohydrates, increasing vegetables, increasing water intake, decreasing eating out, no skipping meals, meal planning and cooking  strategies, keeping healthy foods in the home, and planning for success.  Meliyah has agreed to follow-up with our clinic in 2 weeks. She was informed of the importance of frequent follow-up visits to maximize her success with intensive lifestyle modifications for her multiple health conditions.   Objective:   Blood pressure 125/74, pulse (!) 58, temperature 97.9 F (36.6 C), height 5\' 5"  (1.651 m), weight 196 lb (88.9 kg), SpO2 96 %. Body mass index is 32.62 kg/m.  General: Cooperative, alert, well developed, in no acute distress. HEENT: Conjunctivae and lids unremarkable. Cardiovascular: Regular rhythm.  Lungs: Normal work of breathing. Neurologic: No focal deficits.   Lab Results  Component Value Date   CREATININE 1.45 (H) 12/11/2022   BUN 13 12/11/2022   NA 147 (H) 12/11/2022   K 4.0 12/11/2022   CL 107 12/11/2022   CO2 29 12/11/2022   Lab Results  Component Value Date   ALT 18 12/11/2022   AST 19 12/11/2022   ALKPHOS 78 03/24/2022   BILITOT 0.7 12/11/2022   Lab Results  Component Value Date   HGBA1C 6.0 02/20/2023   HGBA1C 5.7 (A) 10/22/2022   HGBA1C 6.1 (H) 05/09/2022   HGBA1C 5.9 (A) 01/15/2022   HGBA1C 6.4 (H) 08/18/2021   Lab Results  Component Value Date   INSULIN 14.2 03/11/2023   Lab Results  Component Value Date   TSH 2.250 03/11/2023   Lab Results  Component Value Date   CHOL 148 03/11/2023   HDL 55 03/11/2023   LDLCALC 77 03/11/2023   TRIG 81 03/11/2023  CHOLHDL 3.4 05/09/2022   Lab Results  Component Value Date   VD25OH 39.8 03/11/2023   VD25OH 55.8 05/09/2022   VD25OH 53.7 04/21/2020   Lab Results  Component Value Date   WBC 3.7 (L) 12/11/2022   HGB 16.8 (H) 12/11/2022   HCT 50.2 (H) 12/11/2022   MCV 96.9 12/11/2022   PLT 213 12/11/2022   No results found for: "IRON", "TIBC", "FERRITIN"  Attestation Statements:   Reviewed by clinician on day of visit: allergies, medications, problem list, medical history, surgical history,  family history, social history, and previous encounter notes.   Trude Mcburney, am acting as Energy manager for [employer], DO.  I have reviewed the above documentation for accuracy and completeness, and I agree with the above. Corinna Capra, DO

## 2023-04-24 DIAGNOSIS — Z1231 Encounter for screening mammogram for malignant neoplasm of breast: Secondary | ICD-10-CM | POA: Diagnosis not present

## 2023-04-24 LAB — HM MAMMOGRAPHY

## 2023-04-25 ENCOUNTER — Encounter: Payer: Self-pay | Admitting: Family Medicine

## 2023-05-07 ENCOUNTER — Ambulatory Visit (INDEPENDENT_AMBULATORY_CARE_PROVIDER_SITE_OTHER): Payer: Medicare PPO | Admitting: Nurse Practitioner

## 2023-05-07 ENCOUNTER — Encounter: Payer: Self-pay | Admitting: Nurse Practitioner

## 2023-05-07 VITALS — BP 125/83 | HR 57 | Temp 98.2°F | Ht 65.0 in | Wt 196.0 lb

## 2023-05-07 DIAGNOSIS — E669 Obesity, unspecified: Secondary | ICD-10-CM

## 2023-05-07 DIAGNOSIS — Z6832 Body mass index (BMI) 32.0-32.9, adult: Secondary | ICD-10-CM | POA: Diagnosis not present

## 2023-05-07 DIAGNOSIS — I10 Essential (primary) hypertension: Secondary | ICD-10-CM

## 2023-05-07 NOTE — Progress Notes (Signed)
Office: [phone]  /  Fax: [phone]  WEIGHT SUMMARY AND BIOMETRICS  Weight Lost Since Last Visit: 0lb  Weight Gained Since Last Visit: 0lb   Vitals Temp: 98.2 F (36.8 C) BP: 125/83 Pulse Rate: (!) 57 SpO2: 97 %   Anthropometric Measurements Height: 5\' 5"  (1.651 m) Weight: 196 lb (88.9 kg) BMI (Calculated): 32.62 Weight at Last Visit: 196lb Weight Lost Since Last Visit: 0lb Weight Gained Since Last Visit: 0lb Starting Weight: 200lb Total Weight Loss (lbs): 4 lb (1.814 kg)   Body Composition  Body Fat %: 46.6 % Fat Mass (lbs): 91.6 lbs Muscle Mass (lbs): 99.6 lbs Total Body Water (lbs): 74 lbs Visceral Fat Rating : 14   Other Clinical Data Fasting: No Labs: No Today's Visit #: 4 Starting Date: 03/11/23     HPI  Chief Complaint: OBESITY  Claudia Cantrell is here to discuss her progress with her obesity treatment plan. She is on the the Category 1 Plan and states she is following her eating plan approximately 80-85 % of the time. She states she is exercising 20 minutes 3 days per week.   Interval History:  Since last office visit she has maintained her weight.  She celebrated [employer] Day several [employer]. Her protein includes: chicken, [place], fish.  She doesn't eat red meat.  She has decreased her sodium intake. She is drinking a protein shake daily.    Pharmacotherapy for weight loss: She is not currently taking medications  for medical weight loss.    Previous pharmacotherapy for medical weight loss:  None  Bariatric surgery:  Patient has not had bariatric surgery.    Hypertension Hypertension looks better. She saw cardiology last on 03/13/23 Medication(s): Norvasc 5mg , Coreg 12.5mg  BID, lasix 20mg  PRN, isosorbide 5mg , Benicar 40mg  Denies chest pain, palpitations and SOB.  BP Readings from Last 3 Encounters:  05/07/23 125/83  04/22/23 125/74  03/29/23 (!) 148/84   Lab Results  Component Value Date   CREATININE 1.45 (H) 12/11/2022    CREATININE 1.58 (H) 09/17/2022   CREATININE 1.58 (H) 08/16/2022      PHYSICAL EXAM:  Blood pressure 125/83, pulse (!) 57, temperature 98.2 F (36.8 C), height 5\' 5"  (1.651 m), weight 196 lb (88.9 kg), SpO2 97 %. Body mass index is 32.62 kg/m.  General: She is overweight, cooperative, alert, well developed, and in no acute distress. PSYCH: Has normal mood, affect and thought process.   Extremities: No edema.  Neurologic: No gross sensory or motor deficits. No tremors or fasciculations noted.    DIAGNOSTIC DATA REVIEWED:  BMET    Component Value Date/Time   NA 147 (H) 12/11/2022 1204   NA 144 01/24/2022 0911   K 4.0 12/11/2022 1204   CL 107 12/11/2022 1204   CO2 29 12/11/2022 1204   GLUCOSE 82 12/11/2022 1204   BUN 13 12/11/2022 1204   BUN 10 01/24/2022 0911   CREATININE 1.45 (H) 12/11/2022 1204   CALCIUM 9.7 12/11/2022 1204   GFRNONAA 33 (L) 07/10/2022 1111   GFRNONAA 56 (L) 02/16/2021 0710   GFRNONAA 58 (L) 12/19/2018 1414   GFRAA 74 09/16/2020 1205   GFRAA 67 12/19/2018 1414   Lab Results  Component Value Date   HGBA1C 6.0 02/20/2023   HGBA1C 6.0 (H) 01/13/2018   Lab Results  Component Value Date   INSULIN 14.2 03/11/2023   Lab Results  Component Value Date   TSH 2.250 03/11/2023   CBC    Component Value Date/Time   WBC 3.7 (  L) 12/11/2022 1204   RBC 5.18 (H) 12/11/2022 1204   HGB 16.8 (H) 12/11/2022 1204   HGB 15.9 07/14/2021 0918   HCT 50.2 (H) 12/11/2022 1204   HCT 50.1 (H) 07/14/2021 0918   PLT 213 12/11/2022 1204   PLT 240 07/14/2021 0918   MCV 96.9 12/11/2022 1204   MCV 92 07/14/2021 0918   MCH 32.4 12/11/2022 1204   MCHC 33.5 12/11/2022 1204   RDW 13.9 12/11/2022 1204   RDW 14.9 07/14/2021 0918   Iron Studies No results found for: "IRON", "TIBC", "FERRITIN", "IRONPCTSAT" Lipid Panel     Component Value Date/Time   CHOL 148 03/11/2023 1043   TRIG 81 03/11/2023 1043   HDL 55 03/11/2023 1043   CHOLHDL 3.4 05/09/2022 1544   CHOLHDL  3.4 09/22/2019 0226   VLDL 19 09/22/2019 0226   LDLCALC 77 03/11/2023 1043   Hepatic Function Panel     Component Value Date/Time   PROT 8.0 12/11/2022 1204   PROT 8.3 03/24/2021 1113   ALBUMIN 4.6 03/24/2022 0815   ALBUMIN 4.9 (H) 03/24/2021 1113   AST 19 12/11/2022 1204   ALT 18 12/11/2022 1204   ALKPHOS 78 03/24/2022 0815   BILITOT 0.7 12/11/2022 1204   BILITOT 0.5 03/24/2021 1113   BILIDIR 0.12 09/20/2017 1202      Component Value Date/Time   TSH 2.250 03/11/2023 1043   Nutritional Lab Results  Component Value Date   VD25OH 39.8 03/11/2023   VD25OH 55.8 05/09/2022   VD25OH 53.7 04/21/2020     ASSESSMENT AND PLAN  TREATMENT PLAN FOR OBESITY:  Recommended Dietary Goals  Claudia Cantrell is currently in the action stage of change. As such, her goal is to continue weight management plan. She has agreed to the Category 1 Plan.  Behavioral Intervention  We discussed the following Behavioral Modification Strategies today: increasing lean protein intake, decreasing simple carbohydrates , increasing vegetables, increasing lower glycemic fruits, increasing fiber rich foods, avoiding skipping meals, increasing water intake, continue to practice mindfulness when eating, and planning for success.  Additional resources provided today: multiple handouts given to patient today-smart fruit choices, protein options, numerous recipes, etc.     Recommended Physical Activity Goals  Claudia Cantrell has been advised to work up to 150 minutes of moderate intensity aerobic activity a week and strengthening exercises 2-3 times per week for cardiovascular health, weight loss maintenance and preservation of muscle mass.   She has agreed to Continue current level of physical activity , Think about ways to increase daily physical activity and overcoming barriers to exercise, and Increase physical activity in their day and reduce sedentary time (increase NEAT).    ASSOCIATED CONDITIONS ADDRESSED  TODAY  Action/Plan  Essential hypertension Continue to follow up with PCP and cardiology.  Continue medications as directed.   Generalized obesity  BMI 32.0-32.9,adult     She is not a candidate for Phentermine, Contrave or Qsymia due to cardiac history-a fib, HTN, CADm cardiomyopathy, history of MI.  Would consider Wegovy in the future with better coverage.      Return in about 4 weeks (around 06/04/2023).Marland Kitchen She was informed of the importance of frequent follow up visits to maximize her success with intensive lifestyle modifications for her multiple health conditions.   ATTESTASTION STATEMENTS:  Reviewed by clinician on day of visit: allergies, medications, problem list, medical history, surgical history, family history, social history, and previous encounter notes.   Time spent on visit including pre-visit chart review and post-visit care and charting was 30  minutes.    Theodis Sato. Jamarco Zaldivar FNP-C

## 2023-05-15 ENCOUNTER — Other Ambulatory Visit: Payer: Self-pay | Admitting: Bariatrics

## 2023-05-15 DIAGNOSIS — E559 Vitamin D deficiency, unspecified: Secondary | ICD-10-CM

## 2023-05-16 DIAGNOSIS — H2512 Age-related nuclear cataract, left eye: Secondary | ICD-10-CM | POA: Diagnosis not present

## 2023-05-18 ENCOUNTER — Other Ambulatory Visit: Payer: Self-pay | Admitting: Cardiology

## 2023-05-18 ENCOUNTER — Other Ambulatory Visit: Payer: Self-pay | Admitting: Cardiovascular Disease

## 2023-05-18 DIAGNOSIS — I4819 Other persistent atrial fibrillation: Secondary | ICD-10-CM

## 2023-05-20 NOTE — Telephone Encounter (Signed)
Prescription refill request for Eliquis received. Indication:afib Last office visit:4/24 Scr:1.45  1/24 Age: 77 Weight:88.9  kg  Prescription refilled

## 2023-05-21 DIAGNOSIS — I10 Essential (primary) hypertension: Secondary | ICD-10-CM | POA: Diagnosis not present

## 2023-05-21 DIAGNOSIS — G4733 Obstructive sleep apnea (adult) (pediatric): Secondary | ICD-10-CM | POA: Diagnosis not present

## 2023-06-03 ENCOUNTER — Ambulatory Visit: Payer: Medicare PPO | Admitting: Nurse Practitioner

## 2023-06-04 ENCOUNTER — Ambulatory Visit (INDEPENDENT_AMBULATORY_CARE_PROVIDER_SITE_OTHER): Payer: Medicare PPO | Admitting: Nurse Practitioner

## 2023-06-04 ENCOUNTER — Encounter: Payer: Self-pay | Admitting: Nurse Practitioner

## 2023-06-04 VITALS — BP 133/81 | HR 57 | Temp 98.2°F | Ht 65.0 in | Wt 190.0 lb

## 2023-06-04 DIAGNOSIS — E785 Hyperlipidemia, unspecified: Secondary | ICD-10-CM

## 2023-06-04 DIAGNOSIS — Z6831 Body mass index (BMI) 31.0-31.9, adult: Secondary | ICD-10-CM | POA: Diagnosis not present

## 2023-06-04 DIAGNOSIS — E669 Obesity, unspecified: Secondary | ICD-10-CM | POA: Diagnosis not present

## 2023-06-04 DIAGNOSIS — R531 Weakness: Secondary | ICD-10-CM

## 2023-06-04 DIAGNOSIS — R2689 Other abnormalities of gait and mobility: Secondary | ICD-10-CM

## 2023-06-04 NOTE — Progress Notes (Deleted)
Office Visit Note  Patient: Claudia Cantrell             Date of Birth: Feb 17, 1946           MRN: [id number]             PCP: Avanell Shackleton, NP-C Referring: Avanell Shackleton, NP-C Visit Date: 06/18/2023 Occupation: @GUAROCC @  Subjective:  No chief complaint on file.   History of Present Illness: Claudia Cantrell is a 77 y.o. female ***     Activities of Daily Living:  Patient reports morning stiffness for *** {minute/hour:19697}.   Patient {ACTIONS;DENIES/REPORTS:21021675::"Denies"} nocturnal pain.  Difficulty dressing/grooming: {ACTIONS;DENIES/REPORTS:21021675::"Denies"} Difficulty climbing stairs: {ACTIONS;DENIES/REPORTS:21021675::"Denies"} Difficulty getting out of chair: {ACTIONS;DENIES/REPORTS:21021675::"Denies"} Difficulty using hands for taps, buttons, cutlery, and/or writing: {ACTIONS;DENIES/REPORTS:21021675::"Denies"}  No Rheumatology ROS completed.   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/2022   Acquired hypothyroidism 10/30/2021   Stromal tumor determined by gastric biopsy 06/24/2020   Iatrogenic hyperthyroidism 05/02/2020   Dyslipidemia, goal LDL below 70 05/02/2020   Acute combined systolic and diastolic CHF, NYHA class 3 (HCC) 09/23/2019   Persistent atrial fibrillation (HCC) 09/21/2019   Submucosal lesion of stomach 07/19/2019   Abnormal CT of the abdomen 07/19/2019   Abnormal CT scan, stomach 06/17/2019   Advance directive  declined by patient 01/14/2019   Chronic anticoagulation 01/14/2019   CRI (chronic renal insufficiency), stage 2 (mild) 01/14/2019   Prediabetes 01/14/2019   Persistent proteinuria 02/17/2018   Hyperuricemia 03/26/2017   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   Vitamin D deficiency 03/26/2017   Medication monitoring encounter 03/26/2017   Gout 10/08/2016   Essential hypertension    CAD S/P percutaneous coronary angioplasty    Nonischemic cardiomyopathy (HCC)    Chest pain with moderate risk for cardiac etiology 08/02/2016   Closed right ankle fracture 04/24/2015   Ankle fracture, bimalleolar, closed 04/24/2015   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   OSA on CPAP 10/09/2013    Past Medical History:  Diagnosis Date   Arthritis    rheumatoid   Back pain    CAD (coronary artery disease)    a. 1992 s/p MI and PTCA of unknown vessel;  b. 09/2010 Cath: LM nl, LAD 20p, LCX 87m, RCA 62m, RPL 20.   Cancer [facility])    Cervical cancer (HCC) 1977   Chest pain    Chronic kidney disease    Constipation    Family history of anesthesia complication    daughter has difficulty waking    GERD (gastroesophageal reflux disease)    occ   Gout 10/08/2016   History of heart attack    Hyperlipidemia    Hypertensive heart disease    Hypothyroidism    Joint pain    Nonischemic cardiomyopathy (HCC)    a. 07/2016 Echo: EF 40-45%, mild LVH, inferior akinesis, moderately dilated left atrium, trivial AI and MR.   PAF (paroxysmal atrial fibrillation) (HCC)    a. 07/2016 Admitted w/ AF RVR-->CHA2DS2VASc = 5-->Eliquis;  b. 07/2016 successful TEE/DCCV.  Pre-diabetes    Sleep apnea    a. Using CPAP.   SOB (shortness of breath)    Vitamin D deficiency     Family History  Problem Relation Age of Onset   Heart disease Mother    Sudden death Mother    Diabetes Father    Stroke Father    Bone cancer Sister    Sudden death Brother    Melanoma Brother    Heart disease Brother    Colon  cancer Neg Hx    Esophageal cancer Neg Hx    Inflammatory bowel disease Neg Hx    Liver disease Neg Hx    Pancreatic cancer Neg Hx    Rectal cancer Neg Hx    Stomach cancer Neg Hx    Past Surgical History:  Procedure Laterality Date   ABDOMINAL HYSTERECTOMY  1988   ATRIAL FIBRILLATION ABLATION N/A 07/28/2021   Procedure: ATRIAL FIBRILLATION ABLATION;  Surgeon: Regan Lemming, MD;  Location: [facility];  Service: Cardiovascular;  Laterality: N/A;   ATRIAL FIBRILLATION ABLATION N/A 12/14/2022   Procedure: ATRIAL FIBRILLATION ABLATION;  Surgeon: Regan Lemming, MD;  Location: [facility];  Service: Cardiovascular;  Laterality: N/A;   BACK SURGERY  1994   BIOPSY  09/02/2019   Procedure: BIOPSY;  Surgeon: Meridee Score Netty Starring., MD;  Location: [facility];  Service: Gastroenterology;;   BIOPSY  12/09/2019   Procedure: BIOPSY;  Surgeon: Lemar Lofty., MD;  Location: [facility];  Service: Gastroenterology;;   BIOPSY  03/08/2021   Procedure: BIOPSY;  Surgeon: Lemar Lofty., MD;  Location: [facility];  Service: Gastroenterology;;   CARDIAC CATHETERIZATION  1991 AND 1992   PTCA BY DR Meryl Crutch   CARDIOVERSION N/A 08/01/2016   Procedure: CARDIOVERSION;  Surgeon: Lewayne Bunting, MD;  Location: [facility] ENDOSCOPY;  Service: Cardiovascular;  Laterality: N/A;   CARDIOVERSION N/A 01/11/2020   Procedure: CARDIOVERSION;  Surgeon: Wendall Stade, MD;  Location: [facility] ENDOSCOPY;  Service: Cardiovascular;  Laterality: N/A;   CARDIOVERSION N/A 07/08/2020   Procedure: CARDIOVERSION;  Surgeon: Little Ishikawa, MD;  Location: [facility] ENDOSCOPY;  Service: Cardiovascular;  Laterality: N/A;   CARDIOVERSION N/A 05/31/2022   Procedure: CARDIOVERSION;  Surgeon: Chrystie Nose, MD;  Location: [facility] ENDOSCOPY;  Service: Cardiovascular;  Laterality: N/A;   CARDIOVERSION N/A 07/18/2022   Procedure: CARDIOVERSION;  Surgeon: Little Ishikawa, MD;  Location: [facility] ENDOSCOPY;   Service: Cardiovascular;  Laterality: N/A;   ENDOSCOPIC MUCOSAL RESECTION N/A 12/09/2019   Procedure: ENDOSCOPIC MUCOSAL RESECTION;  Surgeon: Lemar Lofty., MD;  Location: [facility] ENDOSCOPY;  Service: Gastroenterology;  Laterality: N/A;   ESOPHAGOGASTRODUODENOSCOPY N/A 03/08/2021   Procedure: ESOPHAGOGASTRODUODENOSCOPY (EGD);  Surgeon: Lemar Lofty., MD;  Location: [facility];  Service: Gastroenterology;  Laterality: N/A;   ESOPHAGOGASTRODUODENOSCOPY (EGD) WITH PROPOFOL N/A 09/02/2019   Procedure: ESOPHAGOGASTRODUODENOSCOPY (EGD) WITH PROPOFOL;  Surgeon: Meridee Score Netty Starring., MD;  Location: [facility] ENDOSCOPY;  Service: Gastroenterology;  Laterality: N/A;   ESOPHAGOGASTRODUODENOSCOPY (EGD) WITH PROPOFOL N/A 12/09/2019   Procedure: ESOPHAGOGASTRODUODENOSCOPY (EGD) WITH PROPOFOL;  Surgeon: Meridee Score Netty Starring., MD;  Location: [facility] ENDOSCOPY;  Service: Gastroenterology;  Laterality: N/A;   EUS N/A 09/02/2019   Procedure: UPPER ENDOSCOPIC ULTRASOUND (EUS) RADIAL;  Surgeon: Lemar Lofty., MD;  Location: [facility] ENDOSCOPY;  Service: Gastroenterology;  Laterality: N/A;  EUS radial/linear   EUS N/A 12/09/2019   Procedure: UPPER ENDOSCOPIC ULTRASOUND (EUS) RADIAL;  Surgeon: Lemar Lofty., MD;  Location: [facility] ENDOSCOPY;  Service: Gastroenterology;  Laterality: N/A;  EUS  12/09/2019   Procedure: UPPER ENDOSCOPIC ULTRASOUND (EUS) LINEAR;  Surgeon: Lemar Lofty., MD;  Location: [facility];  Service: Gastroenterology;;   EUS N/A 03/08/2021   Procedure: UPPER ENDOSCOPIC ULTRASOUND (EUS) RADIAL;  Surgeon: Lemar Lofty., MD;  Location: [facility];  Service: Gastroenterology;  Laterality: N/A;   FINE NEEDLE ASPIRATION  09/02/2019   Procedure: FINE NEEDLE ASPIRATION (FNA) LINEAR;  Surgeon: Lemar Lofty., MD;  Location: [facility] ENDOSCOPY;  Service: Gastroenterology;;   FINE NEEDLE ASPIRATION  12/09/2019   Procedure: FINE NEEDLE ASPIRATION (FNA) LINEAR;  Surgeon:  Lemar Lofty., MD;  Location: [facility];  Service: Gastroenterology;;   HEMOSTASIS CLIP PLACEMENT  12/09/2019   Procedure: HEMOSTASIS CLIP PLACEMENT;  Surgeon: Lemar Lofty., MD;  Location: [facility]COPY;  Service: Gastroenterology;;   ORIF ANKLE FRACTURE Right 04/27/2015   Procedure: OPEN REDUCTION INTERNAL FIXATION (ORIF) RIGHT BIMALLEOLAR ANKLE FRACTURE WITH SYNDESMOSIS FIXATION;  Surgeon: Tarry Kos, MD;  Location: [facility] OR;  Service: Orthopedics;  Laterality: Right;   SUBMUCOSAL LIFTING INJECTION  12/09/2019   Procedure: SUBMUCOSAL LIFTING INJECTION;  Surgeon: Lemar Lofty., MD;  Location: [facility] ENDOSCOPY;  Service: Gastroenterology;;   TEE WITHOUT CARDIOVERSION N/A 08/01/2016   Procedure: TRANSESOPHAGEAL ECHOCARDIOGRAM (TEE);  Surgeon: Lewayne Bunting, MD;  Location: [facility] ENDOSCOPY;  Service: Cardiovascular;  Laterality: N/A;   THYROIDECTOMY  11/24/2013   DR Derrell Lolling   THYROIDECTOMY N/A 11/24/2013   Procedure: TOTAL THYROIDECTOMY;  Surgeon: Ernestene Mention, MD;  Location: [facility] OR;  Service: General;  Laterality: N/A;   TRANSTHORACIC ECHOCARDIOGRAM  01/15/2013   EF 55% TO 65%. PROBABLE MILD HYPOKINESIS OF THE INFERIOR MYOCARDIUM. GRADE 1 DIASTOLIC DYSFUNCTION. TRIAL AR.LA IS MILDLY DILATED.   Social History   Social History Narrative   Right handed    Living  alone.   Immunization History  Administered Date(s) Administered   Fluad Quad(high Dose 65+) 08/22/2019, 09/20/2021   Influenza Nasal 11/25/2013   Influenza, High Dose Seasonal PF 12/30/2014, 10/14/2017, 11/01/2018   Influenza,inj,Quad PF,6+ Mos 11/25/2013   Influenza-Unspecified 12/30/2012, 11/09/2015, 12/24/2016, 09/16/2020   PFIZER(Purple Top)SARS-COV-2 Vaccination 12/29/2019, 01/18/2020, 09/16/2020   Pfizer Covid-19 Vaccine Bivalent Booster 5y-11y 10/21/2021   Pneumococcal Conjugate-13 12/30/2014, 10/14/2017   Pneumococcal Polysaccharide-23 11/25/2013, 08/22/2019   Zoster Recombinat (Shingrix)  10/14/2017, 01/21/2018   Zoster, Live 12/15/2008     Objective: Vital Signs: There were no vitals taken for this visit.   Physical Exam   Musculoskeletal Exam: ***  CDAI Exam: CDAI Score: -- Patient Global: --; Provider Global: -- Swollen: --; Tender: -- Joint Exam 06/18/2023   No joint exam has been documented for this visit   There is currently no information documented on the homunculus. Go to the Rheumatology activity and complete the homunculus joint exam.  Investigation: No additional findings.  Imaging: No results found.  Recent Labs: Lab Results  Component Value Date   WBC 3.7 (L) 12/11/2022   HGB 16.8 (H) 12/11/2022   PLT 213 12/11/2022   NA 147 (H) 12/11/2022   K 4.0 12/11/2022   CL 107 12/11/2022   CO2 29 12/11/2022   GLUCOSE 82 12/11/2022   BUN 13 12/11/2022   CREATININE 1.45 (H) 12/11/2022   BILITOT 0.7 12/11/2022   ALKPHOS 78 03/24/2022   AST 19 12/11/2022   ALT 18 12/11/2022   PROT 8.0 12/11/2022   ALBUMIN 4.6 03/24/2022   CALCIUM 9.7 12/11/2022   GFRAA 74 09/16/2020    Speciality Comments: No specialty comments available.  Procedures:  No procedures  performed Allergies: Labetalol and Codeine   Assessment / Plan:     Visit Diagnoses: No diagnosis found.  Orders: No orders of the defined types were placed in this encounter.  No orders of the defined types were placed in this encounter.   Face-to-face time spent with patient was *** minutes. Greater than 50% of time was spent in counseling and coordination of care.  Follow-Up Instructions: No follow-ups on file.   Ellen Henri, CMA  Note - This record has been created using Animal nutritionist.  Chart creation errors have been sought, but may not always  have been located. Such creation errors do not reflect on  the standard of medical care.

## 2023-06-04 NOTE — Progress Notes (Signed)
Office: [phone]  /  Fax: [phone]  WEIGHT SUMMARY AND BIOMETRICS  Weight Lost Since Last Visit: 6lb  No data recorded  Vitals Temp: 98.2 F (36.8 C) BP: 133/81 Pulse Rate: (!) 57 SpO2: 96 %   Anthropometric Measurements Height: 5\' 5"  (1.651 m) Weight: 190 lb (86.2 kg) BMI (Calculated): 31.62 Weight at Last Visit: 196lb Weight Lost Since Last Visit: 6lb Starting Weight: 200lb Total Weight Loss (lbs): 10 lb (4.536 kg)   Body Composition  Body Fat %: 45.1 % Fat Mass (lbs): 86 lbs Muscle Mass (lbs): 99.2 lbs Total Body Water (lbs): 70.2 lbs Visceral Fat Rating : 13   Other Clinical Data Fasting: Yes Labs: No Today's Visit #: 5 Starting Date: 03/11/23     HPI  Chief Complaint: OBESITY  Claudia Cantrell is here to discuss her progress with her obesity treatment plan. She is on the the Category 1 Plan and states she is following her eating plan approximately 80 % of the time. She states she is exercising 30 minutes 3 days per week.   Interval History:  Since last office visit she has lost 6 pounds. She has been eating more vegetables, aiming to eat more protein and making healthier choices.  She is drinking a protein shake daily for breakfast, lunch:  salad with protein, ham and [place] and dinner: protein and vegetables.  She stopped coffee and juices since her last visit.  She is drinking water daily.  She is cravings sweets in the evenings.  She reports she overall feels better.  Notes weakness at times.  She is struggling with strength in her legs-can find that her legs give out and is concerned about falling.    She is having cataract surgery prior to her next visit.   She stopped her Ozempic 3 weeks ago (prescribed by nephrology).  Just wanted to see how she would do without it.   Pharmacotherapy for weight loss: She is not currently taking medications  for medical weight loss.    Previous pharmacotherapy for medical weight loss:  none  Bariatric surgery:   Has not had bariatric surgery.     Hyperlipidemia Medication(s): Crestor 40mg . Denies side effects.    Lab Results  Component Value Date   CHOL 148 03/11/2023   HDL 55 03/11/2023   LDLCALC 77 03/11/2023   TRIG 81 03/11/2023   CHOLHDL 3.4 05/09/2022   Lab Results  Component Value Date   ALT 18 12/11/2022   AST 19 12/11/2022   ALKPHOS 78 03/24/2022   BILITOT 0.7 12/11/2022   The ASCVD Risk score (Arnett DK, et al., 2019) failed to calculate for the following reasons:   The patient has a prior MI or stroke diagnosis    PHYSICAL EXAM:  Blood pressure 133/81, pulse (!) 57, temperature 98.2 F (36.8 C), height 5\' 5"  (1.651 m), weight 190 lb (86.2 kg), SpO2 96 %. Body mass index is 31.62 kg/m.  General: She is overweight, cooperative, alert, well developed, and in no acute distress. PSYCH: Has normal mood, affect and thought process.   Extremities: No edema.  Neurologic: No gross sensory or motor deficits. No tremors or fasciculations noted.    DIAGNOSTIC DATA REVIEWED:  BMET    Component Value Date/Time   NA 147 (H) 12/11/2022 1204   NA 144 01/24/2022 0911   K 4.0 12/11/2022 1204   CL 107 12/11/2022 1204   CO2 29 12/11/2022 1204   GLUCOSE 82 12/11/2022 1204   BUN 13 12/11/2022 1204  BUN 10 01/24/2022 0911   CREATININE 1.45 (H) 12/11/2022 1204   CALCIUM 9.7 12/11/2022 1204   GFRNONAA 33 (L) 07/10/2022 1111   GFRNONAA 56 (L) 02/16/2021 0710   GFRNONAA 58 (L) 12/19/2018 1414   GFRAA 74 09/16/2020 1205   GFRAA 67 12/19/2018 1414   Lab Results  Component Value Date   HGBA1C 6.0 02/20/2023   HGBA1C 6.0 (H) 01/13/2018   Lab Results  Component Value Date   INSULIN 14.2 03/11/2023   Lab Results  Component Value Date   TSH 2.250 03/11/2023   CBC    Component Value Date/Time   WBC 3.7 (L) 12/11/2022 1204   RBC 5.18 (H) 12/11/2022 1204   HGB 16.8 (H) 12/11/2022 1204   HGB 15.9 07/14/2021 0918   HCT 50.2 (H) 12/11/2022 1204   HCT 50.1 (H) 07/14/2021 0918    PLT 213 12/11/2022 1204   PLT 240 07/14/2021 0918   MCV 96.9 12/11/2022 1204   MCV 92 07/14/2021 0918   MCH 32.4 12/11/2022 1204   MCHC 33.5 12/11/2022 1204   RDW 13.9 12/11/2022 1204   RDW 14.9 07/14/2021 0918   Iron Studies No results found for: "IRON", "TIBC", "FERRITIN", "IRONPCTSAT" Lipid Panel     Component Value Date/Time   CHOL 148 03/11/2023 1043   TRIG 81 03/11/2023 1043   HDL 55 03/11/2023 1043   CHOLHDL 3.4 05/09/2022 1544   CHOLHDL 3.4 09/22/2019 0226   VLDL 19 09/22/2019 0226   LDLCALC 77 03/11/2023 1043   Hepatic Function Panel     Component Value Date/Time   PROT 8.0 12/11/2022 1204   PROT 8.3 03/24/2021 1113   ALBUMIN 4.6 03/24/2022 0815   ALBUMIN 4.9 (H) 03/24/2021 1113   AST 19 12/11/2022 1204   ALT 18 12/11/2022 1204   ALKPHOS 78 03/24/2022 0815   BILITOT 0.7 12/11/2022 1204   BILITOT 0.5 03/24/2021 1113   BILIDIR 0.12 09/20/2017 1202      Component Value Date/Time   TSH 2.250 03/11/2023 1043   Nutritional Lab Results  Component Value Date   VD25OH 39.8 03/11/2023   VD25OH 55.8 05/09/2022   VD25OH 53.7 04/21/2020     ASSESSMENT AND PLAN  TREATMENT PLAN FOR OBESITY:  Recommended Dietary Goals  Claudia Cantrell is currently in the action stage of change. As such, her goal is to continue weight management plan. She has agreed to the Category 1 Plan.  Behavioral Intervention  We discussed the following Behavioral Modification Strategies today: increasing lean protein intake, decreasing simple carbohydrates , increasing vegetables, increasing lower glycemic fruits, increasing fiber rich foods, avoiding skipping meals, increasing water intake, continue to practice mindfulness when eating, and planning for success.  Additional resources provided today: recipes and seasonings.    Recommended Physical Activity Goals   She has agreed to referral to PT.     ASSOCIATED CONDITIONS ADDRESSED TODAY  Action/Plan  Dyslipidemia, goal LDL below  70 Continue to follow up with PCP and cardiology.  Continue meds as directed.   Balance problem -     Ambulatory referral to Physical Therapy  Weakness -     Ambulatory referral to Physical Therapy  Generalized obesity  BMI 31.0-31.9,adult     She is not a candidate for Phentermine, Contrave or Qsymia due to cardiac history-a fib, HTN, CADm cardiomyopathy, history of MI. Would consider Wegovy in the future with better coverage.     Return in about 4 weeks (around 07/02/2023).Marland Kitchen She was informed of the importance of frequent follow up visits  to maximize her success with intensive lifestyle modifications for her multiple health conditions.   ATTESTASTION STATEMENTS:  Reviewed by clinician on day of visit: allergies, medications, problem list, medical history, surgical history, family history, social history, and previous encounter notes.     Theodis Sato. Doree Kuehne FNP-C

## 2023-06-11 DIAGNOSIS — Z01818 Encounter for other preprocedural examination: Secondary | ICD-10-CM | POA: Diagnosis not present

## 2023-06-11 DIAGNOSIS — H11823 Conjunctivochalasis, bilateral: Secondary | ICD-10-CM | POA: Diagnosis not present

## 2023-06-11 DIAGNOSIS — H2512 Age-related nuclear cataract, left eye: Secondary | ICD-10-CM | POA: Diagnosis not present

## 2023-06-12 ENCOUNTER — Ambulatory Visit: Payer: Medicare PPO | Attending: Nurse Practitioner | Admitting: Physical Therapy

## 2023-06-12 VITALS — BP 130/78 | HR 55

## 2023-06-12 DIAGNOSIS — R531 Weakness: Secondary | ICD-10-CM | POA: Diagnosis not present

## 2023-06-12 DIAGNOSIS — M6281 Muscle weakness (generalized): Secondary | ICD-10-CM | POA: Diagnosis not present

## 2023-06-12 DIAGNOSIS — R2689 Other abnormalities of gait and mobility: Secondary | ICD-10-CM | POA: Diagnosis not present

## 2023-06-12 NOTE — Therapy (Signed)
OUTPATIENT PHYSICAL THERAPY NEURO EVALUATION   Patient Name: Claudia Cantrell MRN: [id number] DOB:[birth year], 77 y.o., female Today's Date: 06/12/2023   PCP: Avanell Shackleton, NP-C REFERRING PROVIDER: Irene Limbo, FNP  END OF SESSION:  PT End of Session - 06/12/23 1104     Visit Number 1    Number of Visits 7    Date for PT Re-Evaluation 08/07/23   Due to potential delay in scheduling   Authorization Type [employer]    PT Start Time 1103    PT Stop Time 1145    PT Time Calculation (min) 42 min    Activity Tolerance Patient tolerated treatment well    Behavior During Therapy [facility] for tasks assessed/performed             Past Medical History:  Diagnosis Date   Arthritis    rheumatoid   Back pain    CAD (coronary artery disease)    a. 1992 s/p MI and PTCA of unknown vessel;  b. 09/2010 Cath: LM nl, LAD 20p, LCX 68m, RCA 25m, RPL 20.   Cancer [facility])    Cervical cancer (HCC) 1977   Chest pain    Chronic kidney disease    Constipation    Family history of anesthesia complication    daughter has difficulty waking    GERD (gastroesophageal reflux disease)    occ   Gout 10/08/2016   History of heart attack    Hyperlipidemia    Hypertensive heart disease    Hypothyroidism    Joint pain    Nonischemic cardiomyopathy (HCC)    a. 07/2016 Echo: EF 40-45%, mild LVH, inferior akinesis, moderately dilated left atrium, trivial AI and MR.   PAF (paroxysmal atrial fibrillation) (HCC)    a. 07/2016 Admitted w/ AF RVR-->CHA2DS2VASc = 5-->Eliquis;  b. 07/2016 successful TEE/DCCV.   Pre-diabetes    Sleep apnea    a. Using CPAP.   SOB (shortness of breath)    Vitamin D deficiency    Past Surgical History:  Procedure Laterality Date   ABDOMINAL HYSTERECTOMY  1988   ATRIAL FIBRILLATION ABLATION N/A 07/28/2021   Procedure: ATRIAL FIBRILLATION ABLATION;  Surgeon: Regan Lemming, MD;  Location: [facility] INVASIVE CV [facility];  Service: Cardiovascular;  Laterality: N/A;   ATRIAL  FIBRILLATION ABLATION N/A 12/14/2022   Procedure: ATRIAL FIBRILLATION ABLATION;  Surgeon: Regan Lemming, MD;  Location: [facility] INVASIVE CV LAB;  Service: Cardiovascular;  Laterality: N/A;   BACK SURGERY  1994   BIOPSY  09/02/2019   Procedure: BIOPSY;  Surgeon: Meridee Score Netty Starring., MD;  Location: [facility] ENDOSCOPY;  Service: Gastroenterology;;   BIOPSY  12/09/2019   Procedure: BIOPSY;  Surgeon: Lemar Lofty., MD;  Location: [facility] ENDOSCOPY;  Service: Gastroenterology;;   BIOPSY  03/08/2021   Procedure: BIOPSY;  Surgeon: Lemar Lofty., MD;  Location: [facility]OSCOPY;  Service: Gastroenterology;;   CARDIAC CATHETERIZATION  1991 AND 1992   PTCA BY DR Meryl Crutch   CARDIOVERSION N/A 08/01/2016   Procedure: CARDIOVERSION;  Surgeon: Lewayne Bunting, MD;  Location: [facility] ENDOSCOPY;  Service: Cardiovascular;  Laterality: N/A;   CARDIOVERSION N/A 01/11/2020   Procedure: CARDIOVERSION;  Surgeon: Wendall Stade, MD;  Location: [facility] ENDOSCOPY;  Service: Cardiovascular;  Laterality: N/A;   CARDIOVERSION N/A 07/08/2020   Procedure: CARDIOVERSION;  Surgeon: Little Ishikawa, MD;  Location: [facility] ENDOSCOPY;  Service: Cardiovascular;  Laterality: N/A;   CARDIOVERSION N/A 05/31/2022   Procedure: CARDIOVERSION;  Surgeon: Chrystie Nose, MD;  Location: [facility] ENDOSCOPY;  Service: Cardiovascular;  Laterality: N/A;   CARDIOVERSION N/A 07/18/2022   Procedure: CARDIOVERSION;  Surgeon: Little Ishikawa, MD;  Location: [facility] ENDOSCOPY;  Service: Cardiovascular;  Laterality: N/A;   ENDOSCOPIC MUCOSAL RESECTION N/A 12/09/2019   Procedure: ENDOSCOPIC MUCOSAL RESECTION;  Surgeon: Meridee Score Netty Starring., MD;  Location: [facility] ENDOSCOPY;  Service: Gastroenterology;  Laterality: N/A;   ESOPHAGOGASTRODUODENOSCOPY N/A 03/08/2021   Procedure: ESOPHAGOGASTRODUODENOSCOPY (EGD);  Surgeon: Lemar Lofty., MD;  Location: [facility] ENDOSCOPY;  Service: Gastroenterology;  Laterality: N/A;   ESOPHAGOGASTRODUODENOSCOPY (EGD)  WITH PROPOFOL N/A 09/02/2019   Procedure: ESOPHAGOGASTRODUODENOSCOPY (EGD) WITH PROPOFOL;  Surgeon: Meridee Score Netty Starring., MD;  Location: [facility] ENDOSCOPY;  Service: Gastroenterology;  Laterality: N/A;   ESOPHAGOGASTRODUODENOSCOPY (EGD) WITH PROPOFOL N/A 12/09/2019   Procedure: ESOPHAGOGASTRODUODENOSCOPY (EGD) WITH PROPOFOL;  Surgeon: Meridee Score Netty Starring., MD;  Location: [facility] ENDOSCOPY;  Service: Gastroenterology;  Laterality: N/A;   EUS N/A 09/02/2019   Procedure: UPPER ENDOSCOPIC ULTRASOUND (EUS) RADIAL;  Surgeon: Lemar Lofty., MD;  Location: [facility] ENDOSCOPY;  Service: Gastroenterology;  Laterality: N/A;  EUS radial/linear   EUS N/A 12/09/2019   Procedure: UPPER ENDOSCOPIC ULTRASOUND (EUS) RADIAL;  Surgeon: Lemar Lofty., MD;  Location: [facility] ENDOSCOPY;  Service: Gastroenterology;  Laterality: N/A;   EUS  12/09/2019   Procedure: UPPER ENDOSCOPIC ULTRASOUND (EUS) LINEAR;  Surgeon: Lemar Lofty., MD;  Location: [facility] ENDOSCOPY;  Service: Gastroenterology;;   EUS N/A 03/08/2021   Procedure: UPPER ENDOSCOPIC ULTRASOUND (EUS) RADIAL;  Surgeon: Lemar Lofty., MD;  Location: [facility] ENDOSCOPY;  Service: Gastroenterology;  Laterality: N/A;   FINE NEEDLE ASPIRATION  09/02/2019   Procedure: FINE NEEDLE ASPIRATION (FNA) LINEAR;  Surgeon: Lemar Lofty., MD;  Location: [facility] ENDOSCOPY;  Service: Gastroenterology;;   FINE NEEDLE ASPIRATION  12/09/2019   Procedure: FINE NEEDLE ASPIRATION (FNA) LINEAR;  Surgeon: Lemar Lofty., MD;  Location: [facility] ENDOSCOPY;  Service: Gastroenterology;;   HEMOSTASIS CLIP PLACEMENT  12/09/2019   Procedure: HEMOSTASIS CLIP PLACEMENT;  Surgeon: Lemar Lofty., MD;  Location: [facility] ENDOSCOPY;  Service: Gastroenterology;;   ORIF ANKLE FRACTURE Right 04/27/2015   Procedure: OPEN REDUCTION INTERNAL FIXATION (ORIF) RIGHT BIMALLEOLAR ANKLE FRACTURE WITH SYNDESMOSIS FIXATION;  Surgeon: Tarry Kos, MD;  Location: [facility] OR;  Service: Orthopedics;   Laterality: Right;   SUBMUCOSAL LIFTING INJECTION  12/09/2019   Procedure: SUBMUCOSAL LIFTING INJECTION;  Surgeon: Lemar Lofty., MD;  Location: [facility] ENDOSCOPY;  Service: Gastroenterology;;   TEE WITHOUT CARDIOVERSION N/A 08/01/2016   Procedure: TRANSESOPHAGEAL ECHOCARDIOGRAM (TEE);  Surgeon: Lewayne Bunting, MD;  Location: [facility] ENDOSCOPY;  Service: Cardiovascular;  Laterality: N/A;   THYROIDECTOMY  11/24/2013   DR Derrell Lolling   THYROIDECTOMY N/A 11/24/2013   Procedure: TOTAL THYROIDECTOMY;  Surgeon: Ernestene Mention, MD;  Location: [facility] OR;  Service: General;  Laterality: N/A;   TRANSTHORACIC ECHOCARDIOGRAM  01/15/2013   EF 55% TO 65%. PROBABLE MILD HYPOKINESIS OF THE INFERIOR MYOCARDIUM. GRADE 1 DIASTOLIC DYSFUNCTION. TRIAL AR.LA IS MILDLY DILATED.   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/2022   Acquired hypothyroidism 10/30/2021  Stromal tumor determined by gastric biopsy 06/24/2020   Iatrogenic hyperthyroidism 05/02/2020   Dyslipidemia, goal LDL below 70 05/02/2020   Acute combined systolic and diastolic CHF, NYHA class 3 (HCC) 09/23/2019   Persistent atrial fibrillation (HCC) 09/21/2019   Submucosal lesion of stomach 07/19/2019   Abnormal CT of the abdomen 07/19/2019   Abnormal CT scan, stomach 06/17/2019   Advance directive declined by patient 01/14/2019   Chronic anticoagulation 01/14/2019   CRI (chronic renal insufficiency), stage 2 (mild) 01/14/2019    Prediabetes 01/14/2019   Persistent proteinuria 02/17/2018   Hyperuricemia 03/26/2017   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   Vitamin D deficiency 03/26/2017   Medication monitoring encounter 03/26/2017   Gout 10/08/2016   Essential hypertension    CAD S/P percutaneous coronary angioplasty    Nonischemic cardiomyopathy (HCC)    Chest pain with moderate risk for cardiac etiology 08/02/2016   Closed right ankle fracture 04/24/2015   Ankle fracture, bimalleolar, closed 04/24/2015   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   OSA on CPAP 10/09/2013    ONSET DATE: 06/04/2023 (referral)  REFERRING DIAG: R26.89 (ICD-10-CM) - Balance problem R53.1 (ICD-10-CM) - Weakness  THERAPY DIAG:  Muscle weakness (generalized)  Other abnormalities of gait and mobility  Rationale for Evaluation and Treatment: Rehabilitation  SUBJECTIVE:                                                                                                                                                                                             SUBJECTIVE STATEMENT: "The problem that I am having is, as I have gotten older, my strength is leaving me". Denies falls or near misses. Pt enjoys gardening and going to garden centers and looking at plants. "That helps me, it is stress-reducing". Has lost 15lbs since starting Ozempic.    Pt accompanied by: self  PERTINENT HISTORY: Cardiac Ablation on 12/14/22, scheduled for cataract surgery on 06/26/23. Scheduled for GI endoscopy on 07/08/23  PAIN:  Are you having pain? No  PRECAUTIONS: Fall  WEIGHT BEARING RESTRICTIONS: No  FALLS: Has patient fallen in last 6 months? No  LIVING ENVIRONMENT: Lives with: lives alone Lives in: House/apartment Stairs: Yes: External: 5-7 steps; bilateral but cannot reach both Has following equipment at home: Walker - 2 wheeled and shower chair  PLOF: Independent  PATIENT GOALS: "just to be able to strengthen my muscles"    VITALS  Vitals:   06/12/23 1121  BP: 130/78  Pulse: (!) 55     OBJECTIVE:   DIAGNOSTIC FINDINGS: None that apply to POC  COGNITION: Overall cognitive status: Within functional limits for tasks assessed   SENSATION: Pt  denies numbness/tingling   COORDINATION: Heel to shin test: WNL bilaterally   EDEMA: Chronic swelling in L ankle, does not take diuretic every day   POSTURE: rounded shoulders, forward head, and flexed trunk    LOWER EXTREMITY MMT:  Tested in seated position   MMT Right Eval Left Eval  Hip flexion 3+ 3+  Hip extension    Hip abduction 4 4  Hip adduction 3+ 3+  Hip internal rotation    Hip external rotation    Knee flexion 4 4  Knee extension 4+ 4+  Ankle dorsiflexion 4+ 4+  Ankle plantarflexion    Ankle inversion    Ankle eversion    (Blank rows = not tested)  BED MOBILITY:  Independent per pt  TRANSFERS: Assistive device utilized: None  Sit to stand: Modified independence Stand to sit: Modified independence   GAIT: Gait pattern: WFL Distance walked: Various clinic distances  Assistive device utilized: None Level of assistance: Complete Independence   FUNCTIONAL TESTS:   [facility] PT Assessment - 06/12/23 1133       Transfers   Five time sit to stand comments  32.5s   Hands braced on knees     Ambulation/Gait   Gait velocity 32.8' over 9.63s = 3.41 ft/s no AD   Independent              TODAY'S TREATMENT:       Next Session                                                                                                                           PATIENT EDUCATION: Education details: POC, encouragement to join [employer] to transition to group classes to [employer] strength gains and improve endurance, eval findings  Person educated: Patient Education method: Explanation Education comprehension: verbalized understanding  HOME EXERCISE PROGRAM: To be established   GOALS: Goals reviewed with patient? Yes  SHORT TERM GOALS:  Target date: 07/10/2023   Pt will be independent with initial HEP for improved strength, transfers and gait.  Baseline: not established on eval  Goal status: INITIAL  2.  Pt will improve 5 x STS to less than or equal to 28 seconds without UE support to demonstrate improved functional strength and transfer efficiency.   Baseline: 32.5s w/hands braced on knees Goal status: INITIAL  3.  to be assessed and LTG updated  Baseline:  Goal status: INITIAL   LONG TERM GOALS: Target date: 07/24/2023   Pt will transition to community-based gym [facility]) to participate in group classes and perform gym-based HEP for maintained functional gains and endurance  Baseline: pt to join local [employer] on 7/3 Goal status: INITIAL  2.  goal Baseline:  Goal status: INITIAL  3.  Pt will improve 5 x STS to less than or equal to 25 seconds without UE support to demonstrate improved functional strength and transfer efficiency.   Baseline: 32.5s Goal status: INITIAL   ASSESSMENT:  CLINICAL IMPRESSION: Patient is  a 77 year old female referred to Neuro OPPT for impaired balance and weakness. Pt's PMH is significant for: atrial fibrillation, CAD, OSA, HTN. The following deficits were present during the exam: decreased bilateral hip strength, decreased confidence in mobility and decreased activity tolerance.  Pt would benefit from skilled PT to address these impairments and functional limitations to maximize functional mobility independence.    OBJECTIVE IMPAIRMENTS: decreased activity tolerance, decreased endurance, decreased mobility, difficulty walking, decreased strength, and obesity  ACTIVITY LIMITATIONS: carrying, bending, stairs, and locomotion level  PARTICIPATION LIMITATIONS: shopping, community activity, and yard work  PERSONAL FACTORS: Age, Past/current experiences, and 1 comorbidity: HTN  are also affecting patient's functional outcome.   REHAB POTENTIAL: Good  CLINICAL DECISION MAKING:  Stable/uncomplicated  EVALUATION COMPLEXITY: Low  PLAN:  PT FREQUENCY: 1x/week  PT DURATION: 8 weeks (pt scheduled for 6 weeks, POC written for 8 due to potential scheduling delay)   PLANNED INTERVENTIONS: Therapeutic exercises, Therapeutic activity, Neuromuscular re-education, Balance training, Gait training, Patient/Family education, Self Care, Joint mobilization, Stair training, Aquatic Therapy, Dry Needling, Manual therapy, and Re-evaluation  PLAN FOR NEXT SESSION: and update goal. Did she join [employer]? Establish HEP for BLE strength, endurance. Work on floor transfers to imitate gardening.    Jill Alexanders Joal Eakle, PT, DPT 06/12/2023, 11:52 AM

## 2023-06-17 ENCOUNTER — Other Ambulatory Visit: Payer: Self-pay | Admitting: Bariatrics

## 2023-06-17 DIAGNOSIS — E559 Vitamin D deficiency, unspecified: Secondary | ICD-10-CM

## 2023-06-18 ENCOUNTER — Ambulatory Visit: Payer: Medicare Other | Admitting: Rheumatology

## 2023-06-18 DIAGNOSIS — E790 Hyperuricemia without signs of inflammatory arthritis and tophaceous disease: Secondary | ICD-10-CM

## 2023-06-18 DIAGNOSIS — Z8679 Personal history of other diseases of the circulatory system: Secondary | ICD-10-CM

## 2023-06-18 DIAGNOSIS — G8929 Other chronic pain: Secondary | ICD-10-CM

## 2023-06-18 DIAGNOSIS — Z8639 Personal history of other endocrine, nutritional and metabolic disease: Secondary | ICD-10-CM

## 2023-06-18 DIAGNOSIS — G4733 Obstructive sleep apnea (adult) (pediatric): Secondary | ICD-10-CM

## 2023-06-18 DIAGNOSIS — Z7901 Long term (current) use of anticoagulants: Secondary | ICD-10-CM

## 2023-06-18 DIAGNOSIS — R7309 Other abnormal glucose: Secondary | ICD-10-CM

## 2023-06-18 DIAGNOSIS — M1A09X0 Idiopathic chronic gout, multiple sites, without tophus (tophi): Secondary | ICD-10-CM

## 2023-06-18 DIAGNOSIS — R260 Ataxic gait: Secondary | ICD-10-CM

## 2023-06-18 DIAGNOSIS — Z8509 Personal history of malignant neoplasm of other digestive organs: Secondary | ICD-10-CM

## 2023-06-20 DIAGNOSIS — G4733 Obstructive sleep apnea (adult) (pediatric): Secondary | ICD-10-CM | POA: Diagnosis not present

## 2023-06-20 DIAGNOSIS — I10 Essential (primary) hypertension: Secondary | ICD-10-CM | POA: Diagnosis not present

## 2023-06-21 ENCOUNTER — Ambulatory Visit: Payer: Medicare PPO | Admitting: Family Medicine

## 2023-06-21 ENCOUNTER — Encounter: Payer: Self-pay | Admitting: Family Medicine

## 2023-06-21 ENCOUNTER — Ambulatory Visit (INDEPENDENT_AMBULATORY_CARE_PROVIDER_SITE_OTHER): Payer: Medicare PPO | Admitting: *Deleted

## 2023-06-21 VITALS — BP 124/76 | HR 60 | Temp 97.6°F | Ht 65.0 in | Wt 193.0 lb

## 2023-06-21 DIAGNOSIS — E039 Hypothyroidism, unspecified: Secondary | ICD-10-CM

## 2023-06-21 DIAGNOSIS — Z7901 Long term (current) use of anticoagulants: Secondary | ICD-10-CM

## 2023-06-21 DIAGNOSIS — E785 Hyperlipidemia, unspecified: Secondary | ICD-10-CM | POA: Diagnosis not present

## 2023-06-21 DIAGNOSIS — R7303 Prediabetes: Secondary | ICD-10-CM

## 2023-06-21 DIAGNOSIS — Z0000 Encounter for general adult medical examination without abnormal findings: Secondary | ICD-10-CM

## 2023-06-21 DIAGNOSIS — I10 Essential (primary) hypertension: Secondary | ICD-10-CM | POA: Diagnosis not present

## 2023-06-21 DIAGNOSIS — E559 Vitamin D deficiency, unspecified: Secondary | ICD-10-CM | POA: Diagnosis not present

## 2023-06-21 NOTE — Patient Instructions (Addendum)
Talk to Dr. Meridee Score, your gastroenterologist, about a colonoscopy since you have never had one.  Consider updating your Tdap (tetanus, diphtheria, and pertussis) vaccine as well as your COVID booster this fall.  Keep up the good work!  Keep seeing your specialists as recommended.

## 2023-06-21 NOTE — Patient Instructions (Signed)

## 2023-06-21 NOTE — Progress Notes (Signed)
Subjective:     Patient ID: Claudia Cantrell, female    DOB: Jan 12, 1946, 77 y.o.   MRN: [id number]  Chief Complaint  Patient presents with   Medical Management of Chronic Issues    3 month f/u    HPI  Discussed the use of AI scribe software for clinical note transcription with the patient, who gave verbal consent to proceed.  History of Present Illness         Here for follow up on chronic health conditions. Under the care of [facility] and other specialists.   She is exercising more. On Ozempic. Eating better.   Breathing better. Working on leg strength.   Cataract procedure soon with Dr. Clelia Croft      Health Maintenance Due  Topic Date Due   DTaP/Tdap/Td (1 - Tdap) Never done   Colonoscopy  Never done   COVID-19 Vaccine (5 - 2023-24 season) 08/10/2022    Past Medical History:  Diagnosis Date   Arthritis    rheumatoid   Back pain    CAD (coronary artery disease)    a. 1992 s/p MI and PTCA of unknown vessel;  b. 09/2010 Cath: LM nl, LAD 20p, LCX 110m, RCA 63m, RPL 20.   Cancer [facility])    Cervical cancer (HCC) 1977   Chest pain    Chronic kidney disease    Constipation    Family history of anesthesia complication    daughter has difficulty waking    GERD (gastroesophageal reflux disease)    occ   Gout 10/08/2016   History of heart attack    Hyperlipidemia    Hypertensive heart disease    Hypothyroidism    Joint pain    Nonischemic cardiomyopathy (HCC)    a. 07/2016 Echo: EF 40-45%, mild LVH, inferior akinesis, moderately dilated left atrium, trivial AI and MR.   PAF (paroxysmal atrial fibrillation) (HCC)    a. 07/2016 Admitted w/ AF RVR-->CHA2DS2VASc = 5-->Eliquis;  b. 07/2016 successful TEE/DCCV.   Pre-diabetes    Sleep apnea    a. Using CPAP.   SOB (shortness of breath)    Vitamin D deficiency     Past Surgical History:  Procedure Laterality Date   ABDOMINAL HYSTERECTOMY  1988   ATRIAL FIBRILLATION ABLATION N/A 07/28/2021   Procedure: ATRIAL FIBRILLATION  ABLATION;  Surgeon: Regan Lemming, MD;  Location: [facility];  Service: Cardiovascular;  Laterality: N/A;   ATRIAL FIBRILLATION ABLATION N/A 12/14/2022   Procedure: ATRIAL FIBRILLATION ABLATION;  Surgeon: Regan Lemming, MD;  Location: [facility] CV [facility];  Service: Cardiovascular;  Laterality: N/A;   BACK SURGERY  1994   BIOPSY  09/02/2019   Procedure: BIOPSY;  Surgeon: Meridee Score Netty Starring., MD;  Location: [facility] ENDOSCOPY;  Service: Gastroenterology;;   BIOPSY  12/09/2019   Procedure: BIOPSY;  Surgeon: Lemar Lofty., MD;  Location: [facility] ENDOSCOPY;  Service: Gastroenterology;;   BIOPSY  03/08/2021   Procedure: BIOPSY;  Surgeon: Lemar Lofty., MD;  Location: [facility];  Service: Gastroenterology;;   CARDIAC CATHETERIZATION  1991 AND 1992   PTCA BY DR Meryl Crutch   CARDIOVERSION N/A 08/01/2016   Procedure: CARDIOVERSION;  Surgeon: Lewayne Bunting, MD;  Location: [facility] ENDOSCOPY;  Service: Cardiovascular;  Laterality: N/A;   CARDIOVERSION N/A 01/11/2020   Procedure: CARDIOVERSION;  Surgeon: Wendall Stade, MD;  Location: [facility] ENDOSCOPY;  Service: Cardiovascular;  Laterality: N/A;   CARDIOVERSION N/A 07/08/2020   Procedure: CARDIOVERSION;  Surgeon: Little Ishikawa, MD;  Location:  [facility] ENDOSCOPY;  Service: Cardiovascular;  Laterality: N/A;   CARDIOVERSION N/A 05/31/2022   Procedure: CARDIOVERSION;  Surgeon: Chrystie Nose, MD;  Location: [facility] ENDOSCOPY;  Service: Cardiovascular;  Laterality: N/A;   CARDIOVERSION N/A 07/18/2022   Procedure: CARDIOVERSION;  Surgeon: Little Ishikawa, MD;  Location: [facility] ENDOSCOPY;  Service: Cardiovascular;  Laterality: N/A;   ENDOSCOPIC MUCOSAL RESECTION N/A 12/09/2019   Procedure: ENDOSCOPIC MUCOSAL RESECTION;  Surgeon: Meridee Score Netty Starring., MD;  Location: [facility] ENDOSCOPY;  Service: Gastroenterology;  Laterality: N/A;   ESOPHAGOGASTRODUODENOSCOPY N/A 03/08/2021   Procedure: ESOPHAGOGASTRODUODENOSCOPY (EGD);  Surgeon:  Lemar Lofty., MD;  Location: [facility]OSCOPY;  Service: Gastroenterology;  Laterality: N/A;   ESOPHAGOGASTRODUODENOSCOPY (EGD) WITH PROPOFOL N/A 09/02/2019   Procedure: ESOPHAGOGASTRODUODENOSCOPY (EGD) WITH PROPOFOL;  Surgeon: Meridee Score Netty Starring., MD;  Location: [facility] ENDOSCOPY;  Service: Gastroenterology;  Laterality: N/A;   ESOPHAGOGASTRODUODENOSCOPY (EGD) WITH PROPOFOL N/A 12/09/2019   Procedure: ESOPHAGOGASTRODUODENOSCOPY (EGD) WITH PROPOFOL;  Surgeon: Meridee Score Netty Starring., MD;  Location: [facility] ENDOSCOPY;  Service: Gastroenterology;  Laterality: N/A;   EUS N/A 09/02/2019   Procedure: UPPER ENDOSCOPIC ULTRASOUND (EUS) RADIAL;  Surgeon: Lemar Lofty., MD;  Location: [facility] ENDOSCOPY;  Service: Gastroenterology;  Laterality: N/A;  EUS radial/linear   EUS N/A 12/09/2019   Procedure: UPPER ENDOSCOPIC ULTRASOUND (EUS) RADIAL;  Surgeon: Lemar Lofty., MD;  Location: [facility] ENDOSCOPY;  Service: Gastroenterology;  Laterality: N/A;   EUS  12/09/2019   Procedure: UPPER ENDOSCOPIC ULTRASOUND (EUS) LINEAR;  Surgeon: Lemar Lofty., MD;  Location: [facility] ENDOSCOPY;  Service: Gastroenterology;;   EUS N/A 03/08/2021   Procedure: UPPER ENDOSCOPIC ULTRASOUND (EUS) RADIAL;  Surgeon: Lemar Lofty., MD;  Location: [facility] ENDOSCOPY;  Service: Gastroenterology;  Laterality: N/A;   FINE NEEDLE ASPIRATION  09/02/2019   Procedure: FINE NEEDLE ASPIRATION (FNA) LINEAR;  Surgeon: Lemar Lofty., MD;  Location: [facility] ENDOSCOPY;  Service: Gastroenterology;;   FINE NEEDLE ASPIRATION  12/09/2019   Procedure: FINE NEEDLE ASPIRATION (FNA) LINEAR;  Surgeon: Lemar Lofty., MD;  Location: [facility] ENDOSCOPY;  Service: Gastroenterology;;   HEMOSTASIS CLIP PLACEMENT  12/09/2019   Procedure: HEMOSTASIS CLIP PLACEMENT;  Surgeon: Lemar Lofty., MD;  Location: [facility] ENDOSCOPY;  Service: Gastroenterology;;   ORIF ANKLE FRACTURE Right 04/27/2015   Procedure: OPEN REDUCTION INTERNAL FIXATION  (ORIF) RIGHT BIMALLEOLAR ANKLE FRACTURE WITH SYNDESMOSIS FIXATION;  Surgeon: Tarry Kos, MD;  Location: [facility] OR;  Service: Orthopedics;  Laterality: Right;   SUBMUCOSAL LIFTING INJECTION  12/09/2019   Procedure: SUBMUCOSAL LIFTING INJECTION;  Surgeon: Lemar Lofty., MD;  Location: [facility] ENDOSCOPY;  Service: Gastroenterology;;   TEE WITHOUT CARDIOVERSION N/A 08/01/2016   Procedure: TRANSESOPHAGEAL ECHOCARDIOGRAM (TEE);  Surgeon: Lewayne Bunting, MD;  Location: [facility] ENDOSCOPY;  Service: Cardiovascular;  Laterality: N/A;   THYROIDECTOMY  11/24/2013   DR Derrell Lolling   THYROIDECTOMY N/A 11/24/2013   Procedure: TOTAL THYROIDECTOMY;  Surgeon: Ernestene Mention, MD;  Location: [facility] OR;  Service: General;  Laterality: N/A;   TRANSTHORACIC ECHOCARDIOGRAM  01/15/2013   EF 55% TO 65%. PROBABLE MILD HYPOKINESIS OF THE INFERIOR MYOCARDIUM. GRADE 1 DIASTOLIC DYSFUNCTION. TRIAL AR.LA IS MILDLY DILATED.    Family History  Problem Relation Age of Onset   Heart disease Mother    Sudden death Mother    Diabetes Father    Stroke Father    Bone cancer Sister    Sudden death Brother    Melanoma Brother    Heart disease Brother    Colon cancer Neg Hx    Esophageal cancer Neg Hx  Inflammatory bowel disease Neg Hx    Liver disease Neg Hx    Pancreatic cancer Neg Hx    Rectal cancer Neg Hx    Stomach cancer Neg Hx     Social History   Socioeconomic History   Marital status: Widowed    Spouse name: Not on file   Number of children: 3   Years of education: Not on file   Highest education level: Not on file  Occupational History   Occupation: retired  Tobacco Use   Smoking status: Former    Current packs/day: 0.00    Average packs/day: 1 pack/day for 15.0 years (15.0 ttl pk-yrs)    Types: Cigarettes    Start date: 12/11/1975    Quit date: 12/10/1990    Years since quitting: 32.5    Passive exposure: Never   Smokeless tobacco: Never   Tobacco comments:    Former smoker 05/21/22  Vaping Use    Vaping status: Never Used  Substance and Sexual Activity   Alcohol use: Yes    Comment: very rare   Drug use: No   Sexual activity: Not on file  Other Topics Concern   Not on file  Social History Narrative   Right handed    Living  alone.   Social Determinants of Health   Financial Resource Strain: Low Risk  (06/21/2023)   Overall Financial Resource Strain (CARDIA)    Difficulty of Paying Living Expenses: Not very hard  Food Insecurity: No Food Insecurity (06/21/2023)   Hunger Vital Sign    Worried About Running Out of Food in the Last Year: Never true    Ran Out of Food in the Last Year: Never true  Transportation Needs: No Transportation Needs (06/21/2023)   PRAPARE - Administrator, Civil Service (Medical): No    Lack of Transportation (Non-Medical): No  Physical Activity: Insufficiently Active (06/21/2023)   Exercise Vital Sign    Days of Exercise per Week: 5 days    Minutes of Exercise per Session: 20 min  Stress: No Stress Concern Present (06/21/2023)   [employer] of Occupational Health - Occupational Stress Questionnaire    Feeling of Stress : Not at all  Social Connections: Socially Isolated (06/21/2023)   Social Connection and Isolation Panel [NHANES]    Frequency of Communication with Friends and Family: Twice a week    Frequency of Social Gatherings with Friends and Family: Twice a week    Attends Religious Services: Never    Database administrator or Organizations: No    Attends Banker Meetings: Never    Marital Status: Widowed  Intimate Partner Violence: Not At Risk (06/21/2023)   Humiliation, Afraid, Rape, and Kick questionnaire    Fear of Current or Ex-Partner: No    Emotionally Abused: No    Physically Abused: No    Sexually Abused: No    Outpatient Medications Prior to Visit  Medication Sig Dispense Refill   acetaminophen (TYLENOL) 500 MG tablet Take 1,000 mg by mouth every 6 (six) hours as needed for moderate pain.      allopurinol (ZYLOPRIM) 100 MG tablet Take 1 tablet (100 mg total) by mouth daily. 30 tablet 2   amLODipine (NORVASC) 5 MG tablet TAKE 1 & 1/2 (ONE & ONE-HALF) TABLETS BY MOUTH IN THE EVENING 135 tablet 0   apixaban (ELIQUIS) 5 MG TABS tablet Take 1 tablet by mouth twice daily 180 tablet 1   carvedilol (COREG) 12.5 MG tablet Take 12.5  mg by mouth 2 (two) times daily with a meal.     cyanocobalamin (VITAMIN B12) 1000 MCG tablet Take 1,000 mcg by mouth daily.     FARXIGA 10 MG TABS tablet Take 10 mg by mouth in the morning.     furosemide (LASIX) 20 MG tablet Take 20 mg by mouth daily as needed for edema.     isosorbide dinitrate (ISORDIL) 5 MG tablet Take 1 tablet by mouth twice daily 180 tablet 0   Multiple Vitamins-Minerals (OCUVITE ADULT 50+ PO) Take by mouth.     olmesartan (BENICAR) 40 MG tablet Take 40 mg by mouth daily.     OZEMPIC, 0.25 OR 0.5 MG/DOSE, 2 MG/3ML SOPN Inject 0.5 mg into the skin once a week.     Probiotic Product (PROBIOTIC PO) Take 1 capsule by mouth 2 (two) times a week.     rosuvastatin (CRESTOR) 40 MG tablet Take 1 tablet by mouth once daily 90 tablet 3   SYNTHROID 112 MCG tablet Take 1 tablet (112 mcg total) by mouth daily before breakfast. 90 tablet 2   UNABLE TO FIND Med Name: Advanced Metabolism Booster     Vitamin D, Ergocalciferol, (DRISDOL) 1.25 MG (50000 UNIT) CAPS capsule Take 1 capsule by mouth once a week 5 capsule 0   Vitamin D3 (VITAMIN D) 25 MCG tablet Take 1,000 Units by mouth 2 (two) times a week.     Multiple Vitamins-Minerals (HAIR SKIN NAILS PO) Take by mouth. (Patient not taking: Reported on 06/21/2023)     No facility-administered medications prior to visit.    Allergies  Allergen Reactions   Labetalol     headaches   Codeine Anxiety    Review of Systems  Constitutional:  Negative for chills and fever.  Respiratory:  Negative for shortness of breath.   Cardiovascular:  Negative for chest pain, palpitations and leg swelling.   Gastrointestinal:  Negative for abdominal pain, constipation, diarrhea, nausea and vomiting.  Genitourinary:  Negative for dysuria, frequency and urgency.  Neurological:  Negative for dizziness.       Objective:    Physical Exam Constitutional:      General: She is not in acute distress.    Appearance: She is not ill-appearing.  Eyes:     Extraocular Movements: Extraocular movements intact.     Conjunctiva/sclera: Conjunctivae normal.  Cardiovascular:     Rate and Rhythm: Normal rate and regular rhythm.  Pulmonary:     Effort: Pulmonary effort is normal.     Breath sounds: Normal breath sounds.  Musculoskeletal:     Cervical back: Normal range of motion and neck supple.     Right lower leg: No edema.     Left lower leg: No edema.  Skin:    General: Skin is warm and dry.  Neurological:     General: No focal deficit present.     Mental Status: She is alert and oriented to person, place, and time.  Psychiatric:        Mood and Affect: Mood normal.        Behavior: Behavior normal.        Thought Content: Thought content normal.      BP 124/76 (BP Location: Left Arm, Patient Position: Sitting, Cuff Size: Large)   Pulse 60   Temp 97.6 F (36.4 C) (Temporal)   Ht 5\' 5"  (1.651 m)   Wt 193 lb (87.5 kg)   SpO2 96%   BMI 32.12 kg/m  Wt Readings from Last 3  Encounters:  06/21/23 193 lb (87.5 kg)  06/21/23 193 lb (87.5 kg)  06/04/23 190 lb (86.2 kg)       Assessment & Plan:   Problem List Items Addressed This Visit       Cardiovascular and Mediastinum   Essential hypertension     Endocrine   Acquired hypothyroidism     Other   Chronic anticoagulation   Dyslipidemia, goal LDL below 70 - Primary   Prediabetes   Vitamin D deficiency   Reviewed notes from specialists. She appears to be doing well overall.  No changes to medications. Continue close follow up with specialists.  Follow up here in 3 months.   I have discontinued Shrena Belmarez. Keefe's Multiple  Vitamins-Minerals (HAIR SKIN NAILS PO). I am also having her maintain her vitamin D3, Farxiga, Probiotic Product (PROBIOTIC PO), furosemide, acetaminophen, allopurinol, olmesartan, carvedilol, cyanocobalamin, Multiple Vitamins-Minerals (OCUVITE ADULT 50+ PO), amLODipine, isosorbide dinitrate, Ozempic (0.25 or 0.5 MG/DOSE), Synthroid, UNABLE TO FIND, Eliquis, rosuvastatin, and Vitamin D (Ergocalciferol).  No orders of the defined types were placed in this encounter.

## 2023-06-21 NOTE — Progress Notes (Signed)
Subjective:   Claudia Cantrell is a 77 y.o. female who presents for Medicare Annual (Subsequent) preventive examination.    Patient Medicare AWV questionnaire was  not completed by the patient.   Review of Systems    Defer to PCP   Cardiac Risk Factors include: advanced age (>72men, >70 women);obesity (BMI >30kg/m2);hypertension    Please see problem list for additional risk factors Objective:    Today's Vitals   06/21/23 1432  BP: 124/76  Pulse: 60  Temp: 97.6 F (36.4 C)  TempSrc: Oral  SpO2: 96%  Weight: 193 lb (87.5 kg)  Height: 5\' 5"  (1.651 m)   Body mass index is 32.12 kg/m.     06/21/2023    3:04 PM 06/12/2023   11:04 AM 12/14/2022    6:08 AM 09/14/2022   10:32 AM 07/18/2022    9:58 AM 05/31/2022    9:11 AM 03/24/2022    8:02 AM  Advanced Directives  Does Patient Have a Medical Advance Directive? No No No No No No No  Would patient like information on creating a medical advance directive? No - Patient declined No - Patient declined No - Patient declined Yes (MAU/Ambulatory/Procedural Areas - Information given)  No - Patient declined No - Patient declined    Current Medications (verified) Outpatient Encounter Medications as of 06/21/2023  Medication Sig   acetaminophen (TYLENOL) 500 MG tablet Take 1,000 mg by mouth every 6 (six) hours as needed for moderate pain.   allopurinol (ZYLOPRIM) 100 MG tablet Take 1 tablet (100 mg total) by mouth daily.   amLODipine (NORVASC) 5 MG tablet TAKE 1 & 1/2 (ONE & ONE-HALF) TABLETS BY MOUTH IN THE EVENING   apixaban (ELIQUIS) 5 MG TABS tablet Take 1 tablet by mouth twice daily   carvedilol (COREG) 12.5 MG tablet Take 12.5 mg by mouth 2 (two) times daily with a meal.   cyanocobalamin (VITAMIN B12) 1000 MCG tablet Take 1,000 mcg by mouth daily.   FARXIGA 10 MG TABS tablet Take 10 mg by mouth in the morning.   furosemide (LASIX) 20 MG tablet Take 20 mg by mouth daily as needed for edema.   isosorbide dinitrate (ISORDIL) 5 MG tablet  Take 1 tablet by mouth twice daily   Multiple Vitamins-Minerals (OCUVITE ADULT 50+ PO) Take by mouth.   olmesartan (BENICAR) 40 MG tablet Take 40 mg by mouth daily.   OZEMPIC, 0.25 OR 0.5 MG/DOSE, 2 MG/3ML SOPN Inject 0.5 mg into the skin once a week.   Probiotic Product (PROBIOTIC PO) Take 1 capsule by mouth 2 (two) times a week.   rosuvastatin (CRESTOR) 40 MG tablet Take 1 tablet by mouth once daily   SYNTHROID 112 MCG tablet Take 1 tablet (112 mcg total) by mouth daily before breakfast.   UNABLE TO FIND Med Name: Advanced Metabolism Booster   Vitamin D, Ergocalciferol, (DRISDOL) 1.25 MG (50000 UNIT) CAPS capsule Take 1 capsule by mouth once a week   Vitamin D3 (VITAMIN D) 25 MCG tablet Take 1,000 Units by mouth 2 (two) times a week.   No facility-administered encounter medications on file as of 06/21/2023.    Allergies (verified) Labetalol and Codeine   History: Past Medical History:  Diagnosis Date   Arthritis    rheumatoid   Back pain    CAD (coronary artery disease)    a. 1992 s/p MI and PTCA of unknown vessel;  b. 09/2010 Cath: LM nl, LAD 20p, LCX 8m, RCA 33m, RPL 20.   Cancer (HCC)  Cervical cancer (HCC) 1977   Chest pain    Chronic kidney disease    Constipation    Family history of anesthesia complication    daughter has difficulty waking    GERD (gastroesophageal reflux disease)    occ   Gout 10/08/2016   History of heart attack    Hyperlipidemia    Hypertensive heart disease    Hypothyroidism    Joint pain    Nonischemic cardiomyopathy (HCC)    a. 07/2016 Echo: EF 40-45%, mild LVH, inferior akinesis, moderately dilated left atrium, trivial AI and MR.   PAF (paroxysmal atrial fibrillation) (HCC)    a. 07/2016 Admitted w/ AF RVR-->CHA2DS2VASc = 5-->Eliquis;  b. 07/2016 successful TEE/DCCV.   Pre-diabetes    Sleep apnea    a. Using CPAP.   SOB (shortness of breath)    Vitamin D deficiency    Past Surgical History:  Procedure Laterality Date   ABDOMINAL  HYSTERECTOMY  1988   ATRIAL FIBRILLATION ABLATION N/A 07/28/2021   Procedure: ATRIAL FIBRILLATION ABLATION;  Surgeon: Regan Lemming, MD;  Location: [facility];  Service: Cardiovascular;  Laterality: N/A;   ATRIAL FIBRILLATION ABLATION N/A 12/14/2022   Procedure: ATRIAL FIBRILLATION ABLATION;  Surgeon: Regan Lemming, MD;  Location: [facility];  Service: Cardiovascular;  Laterality: N/A;   BACK SURGERY  1994   BIOPSY  09/02/2019   Procedure: BIOPSY;  Surgeon: Meridee Score Netty Starring., MD;  Location: [facility];  Service: Gastroenterology;;   BIOPSY  12/09/2019   Procedure: BIOPSY;  Surgeon: Lemar Lofty., MD;  Location: [facility];  Service: Gastroenterology;;   BIOPSY  03/08/2021   Procedure: BIOPSY;  Surgeon: Lemar Lofty., MD;  Location: [facility];  Service: Gastroenterology;;   CARDIAC CATHETERIZATION  1991 AND 1992   PTCA BY DR Meryl Crutch   CARDIOVERSION N/A 08/01/2016   Procedure: CARDIOVERSION;  Surgeon: Lewayne Bunting, MD;  Location: [facility] ENDOSCOPY;  Service: Cardiovascular;  Laterality: N/A;   CARDIOVERSION N/A 01/11/2020   Procedure: CARDIOVERSION;  Surgeon: Wendall Stade, MD;  Location: [facility] ENDOSCOPY;  Service: Cardiovascular;  Laterality: N/A;   CARDIOVERSION N/A 07/08/2020   Procedure: CARDIOVERSION;  Surgeon: Little Ishikawa, MD;  Location: [facility] ENDOSCOPY;  Service: Cardiovascular;  Laterality: N/A;   CARDIOVERSION N/A 05/31/2022   Procedure: CARDIOVERSION;  Surgeon: Chrystie Nose, MD;  Location: [facility] ENDOSCOPY;  Service: Cardiovascular;  Laterality: N/A;   CARDIOVERSION N/A 07/18/2022   Procedure: CARDIOVERSION;  Surgeon: Little Ishikawa, MD;  Location: [facility] ENDOSCOPY;  Service: Cardiovascular;  Laterality: N/A;   ENDOSCOPIC MUCOSAL RESECTION N/A 12/09/2019   Procedure: ENDOSCOPIC MUCOSAL RESECTION;  Surgeon: Lemar Lofty., MD;  Location: [facility] ENDOSCOPY;  Service: Gastroenterology;  Laterality: N/A;    ESOPHAGOGASTRODUODENOSCOPY N/A 03/08/2021   Procedure: ESOPHAGOGASTRODUODENOSCOPY (EGD);  Surgeon: Lemar Lofty., MD;  Location: [facility];  Service: Gastroenterology;  Laterality: N/A;   ESOPHAGOGASTRODUODENOSCOPY (EGD) WITH PROPOFOL N/A 09/02/2019   Procedure: ESOPHAGOGASTRODUODENOSCOPY (EGD) WITH PROPOFOL;  Surgeon: Meridee Score Netty Starring., MD;  Location: [facility] ENDOSCOPY;  Service: Gastroenterology;  Laterality: N/A;   ESOPHAGOGASTRODUODENOSCOPY (EGD) WITH PROPOFOL N/A 12/09/2019   Procedure: ESOPHAGOGASTRODUODENOSCOPY (EGD) WITH PROPOFOL;  Surgeon: Meridee Score Netty Starring., MD;  Location: [facility] ENDOSCOPY;  Service: Gastroenterology;  Laterality: N/A;   EUS N/A 09/02/2019   Procedure: UPPER ENDOSCOPIC ULTRASOUND (EUS) RADIAL;  Surgeon: Lemar Lofty., MD;  Location: [facility] ENDOSCOPY;  Service: Gastroenterology;  Laterality: N/A;  EUS radial/linear   EUS N/A 12/09/2019   Procedure: UPPER ENDOSCOPIC ULTRASOUND (  EUS) RADIAL;  Surgeon: Meridee Score Netty Starring., MD;  Location: [facility] ENDOSCOPY;  Service: Gastroenterology;  Laterality: N/A;   EUS  12/09/2019   Procedure: UPPER ENDOSCOPIC ULTRASOUND (EUS) LINEAR;  Surgeon: Lemar Lofty., MD;  Location: [facility] ENDOSCOPY;  Service: Gastroenterology;;   EUS N/A 03/08/2021   Procedure: UPPER ENDOSCOPIC ULTRASOUND (EUS) RADIAL;  Surgeon: Lemar Lofty., MD;  Location: [facility] ENDOSCOPY;  Service: Gastroenterology;  Laterality: N/A;   FINE NEEDLE ASPIRATION  09/02/2019   Procedure: FINE NEEDLE ASPIRATION (FNA) LINEAR;  Surgeon: Lemar Lofty., MD;  Location: [facility] ENDOSCOPY;  Service: Gastroenterology;;   FINE NEEDLE ASPIRATION  12/09/2019   Procedure: FINE NEEDLE ASPIRATION (FNA) LINEAR;  Surgeon: Lemar Lofty., MD;  Location: [facility] ENDOSCOPY;  Service: Gastroenterology;;   HEMOSTASIS CLIP PLACEMENT  12/09/2019   Procedure: HEMOSTASIS CLIP PLACEMENT;  Surgeon: Lemar Lofty., MD;  Location: [facility] ENDOSCOPY;  Service:  Gastroenterology;;   ORIF ANKLE FRACTURE Right 04/27/2015   Procedure: OPEN REDUCTION INTERNAL FIXATION (ORIF) RIGHT BIMALLEOLAR ANKLE FRACTURE WITH SYNDESMOSIS FIXATION;  Surgeon: Tarry Kos, MD;  Location: [facility] OR;  Service: Orthopedics;  Laterality: Right;   SUBMUCOSAL LIFTING INJECTION  12/09/2019   Procedure: SUBMUCOSAL LIFTING INJECTION;  Surgeon: Lemar Lofty., MD;  Location: [facility] ENDOSCOPY;  Service: Gastroenterology;;   TEE WITHOUT CARDIOVERSION N/A 08/01/2016   Procedure: TRANSESOPHAGEAL ECHOCARDIOGRAM (TEE);  Surgeon: Lewayne Bunting, MD;  Location: [facility] ENDOSCOPY;  Service: Cardiovascular;  Laterality: N/A;   THYROIDECTOMY  11/24/2013   DR Derrell Lolling   THYROIDECTOMY N/A 11/24/2013   Procedure: TOTAL THYROIDECTOMY;  Surgeon: Ernestene Mention, MD;  Location: [facility] OR;  Service: General;  Laterality: N/A;   TRANSTHORACIC ECHOCARDIOGRAM  01/15/2013   EF 55% TO 65%. PROBABLE MILD HYPOKINESIS OF THE INFERIOR MYOCARDIUM. GRADE 1 DIASTOLIC DYSFUNCTION. TRIAL AR.LA IS MILDLY DILATED.   Family History  Problem Relation Age of Onset   Heart disease Mother    Sudden death Mother    Diabetes Father    Stroke Father    Bone cancer Sister    Sudden death Brother    Melanoma Brother    Heart disease Brother    Colon cancer Neg Hx    Esophageal cancer Neg Hx    Inflammatory bowel disease Neg Hx    Liver disease Neg Hx    Pancreatic cancer Neg Hx    Rectal cancer Neg Hx    Stomach cancer Neg Hx    Social History   Socioeconomic History   Marital status: Widowed    Spouse name: Not on file   Number of children: 3   Years of education: Not on file   Highest education level: Not on file  Occupational History   Occupation: retired  Tobacco Use   Smoking status: Former    Current packs/day: 0.00    Average packs/day: 1 pack/day for 15.0 years (15.0 ttl pk-yrs)    Types: Cigarettes    Start date: 12/11/1975    Quit date: 12/10/1990    Years since quitting: 32.5    Passive exposure:  Never   Smokeless tobacco: Never   Tobacco comments:    Former smoker 05/21/22  Vaping Use   Vaping status: Never Used  Substance and Sexual Activity   Alcohol use: Yes    Comment: very rare   Drug use: No   Sexual activity: Not on file  Other Topics Concern   Not on file  Social History Narrative   Right handed    Living  alone.  Social Determinants of Health   Financial Resource Strain: Low Risk  (06/21/2023)   Overall Financial Resource Strain (CARDIA)    Difficulty of Paying Living Expenses: Not very hard  Food Insecurity: No Food Insecurity (06/21/2023)   Hunger Vital Sign    Worried About Running Out of Food in the Last Year: Never true    Ran Out of Food in the Last Year: Never true  Transportation Needs: No Transportation Needs (06/21/2023)   PRAPARE - Administrator, Civil Service (Medical): No    Lack of Transportation (Non-Medical): No  Physical Activity: Insufficiently Active (06/21/2023)   Exercise Vital Sign    Days of Exercise per Week: 5 days    Minutes of Exercise per Session: 20 min  Stress: No Stress Concern Present (06/21/2023)   [employer] of Occupational Health - Occupational Stress Questionnaire    Feeling of Stress : Not at all  Social Connections: Socially Isolated (06/21/2023)   Social Connection and Isolation Panel [NHANES]    Frequency of Communication with Friends and Family: Twice a week    Frequency of Social Gatherings with Friends and Family: Twice a week    Attends Religious Services: Never    Database administrator or Organizations: No    Attends Banker Meetings: Never    Marital Status: Widowed    Tobacco Counseling Counseling given: Not Answered Tobacco comments: Former smoker 05/21/22   Clinical Intake:  Pre-visit preparation completed: Yes  Pain : No/denies pain     Nutritional Status: BMI > 30  Obese Nutritional Risks: None Diabetes: No  How often do you need to have someone help you  when you read instructions, pamphlets, or other written materials from your doctor or pharmacy?: 1 - Never What is the last grade level you completed in school?: college  Interpreter Needed?: No      Activities of Daily Living    06/21/2023    3:03 PM 06/21/2023    3:01 PM  In your present state of health, do you have any difficulty performing the following activities:  Hearing?  0  Vision? 0   Difficulty concentrating or making decisions?  0  Walking or climbing stairs?  0  Dressing or bathing?  0  Doing errands, shopping?  0  Preparing Food and eating ? N N  Using the Toilet? N N  In the past six months, have you accidently leaked urine? N N  Do you have problems with loss of bowel control?  N  Managing your Medications? N N  Managing your Finances? N N  Housekeeping or managing your Housekeeping? N N    Patient Care Team: Avanell Shackleton, NP-C as PCP - General (Family Medicine) Regan Lemming, MD as PCP - Cardiology (Cardiology)  Indicate any recent Medical Services you may have received from other than [facility] providers in the past year (date may be approximate).     Assessment:   This is a routine wellness examination for Daffne.  Hearing/Vision screen Vision Screening - Comments:: Annual eye exam, Dr. Earlene Plater   Dietary issues and exercise activities discussed:     Goals Addressed   None   Depression Screen    06/21/2023    3:11 PM 06/21/2023    3:04 PM 03/29/2023    2:21 PM 11/09/2022    9:59 AM 09/14/2022   10:32 AM 05/09/2022    2:49 PM 05/05/2021    9:23 AM  PHQ 2/9 Scores  PHQ -  2 Score 0 0 0 1 0 0 0    Fall Risk    06/21/2023    2:58 PM 03/29/2023    2:21 PM 11/09/2022    9:59 AM 09/14/2022   10:27 AM 05/09/2022    2:49 PM  Fall Risk   Falls in the past year? 0 0 0 0 0  Number falls in past yr: 0 0   0  Injury with Fall? 0 0   0  Risk for fall due to : No Fall Risks No Fall Risks   No Fall Risks  Follow up Falls evaluation completed Falls  evaluation completed   Falls evaluation completed    MEDICARE RISK AT HOME:  Medicare Risk at Home - 06/21/23 1512     Any stairs in or around the home? Yes    If so, are there any without handrails? No    Home free of loose throw rugs in walkways, pet beds, electrical cords, etc? Yes    Adequate lighting in your home to reduce risk of falls? Yes    Life alert? No    Use of a cane, walker or w/c? No    Grab bars in the bathroom? No    Shower chair or bench in shower? No    Elevated toilet seat or a handicapped toilet? No             TIMED UP AND GO:  Was the test performed?  Yes  Length of time to ambulate 10 feet: 6 sec Gait steady and fast with assistive device    Cognitive Function:        06/21/2023    2:59 PM  6CIT Screen  What Year? 0 points  What month? 0 points  What time? 0 points  Count back from 20 0 points  Months in reverse 0 points  Repeat phrase 0 points  Total Score 0 points    Immunizations Immunization History  Administered Date(s) Administered   Fluad Quad(high Dose 65+) 08/22/2019, 09/20/2021   Influenza Nasal 11/25/2013   Influenza, High Dose Seasonal PF 12/30/2014, 10/14/2017, 11/01/2018   Influenza,inj,Quad PF,6+ Mos 11/25/2013   Influenza-Unspecified 12/30/2012, 11/09/2015, 12/24/2016, 09/16/2020   PFIZER(Purple Top)SARS-COV-2 Vaccination 12/29/2019, 01/18/2020, 09/16/2020   Pfizer Covid-19 Vaccine Bivalent Booster 5y-11y 10/21/2021   Pneumococcal Conjugate-13 12/30/2014, 10/14/2017   Pneumococcal Polysaccharide-23 11/25/2013, 08/22/2019   Zoster Recombinant(Shingrix) 10/14/2017, 01/21/2018   Zoster, Live 12/15/2008    TDAP status: Due, Education has been provided regarding the importance of this vaccine. Advised may receive this vaccine at local pharmacy or Health Dept. Aware to provide a copy of the vaccination record if obtained from local pharmacy or Health Dept. Verbalized acceptance and understanding.  Flu Vaccine status: Up  to date  Pneumococcal vaccine status: Up to date  Covid-19 vaccine status: Completed vaccines  Qualifies for Shingles Vaccine? Yes   Zostavax completed Yes   Shingrix Completed?: Yes  Screening Tests Health Maintenance  Topic Date Due   DTaP/Tdap/Td (1 - Tdap) Never done   Colonoscopy  Never done   COVID-19 Vaccine (5 - 2023-24 season) 08/10/2022   INFLUENZA VACCINE  07/11/2023   Medicare Annual Wellness (AWV)  06/20/2024   MAMMOGRAM  04/23/2025   Pneumonia Vaccine 62+ Years old  Completed   DEXA SCAN  Completed   Hepatitis C Screening  Completed   Zoster Vaccines- Shingrix  Completed   HPV VACCINES  Aged Out    Health Maintenance  Health Maintenance Due  Topic Date Due  DTaP/Tdap/Td (1 - Tdap) Never done   Colonoscopy  Never done   COVID-19 Vaccine (5 - 2023-24 season) 08/10/2022    Colorectal cancer screening: No longer required.   Mammogram status: Completed 04/18/2022. Repeat every year  Bone Density status: Completed 07/13/2022. Results reflect: Bone density results: NORMAL. Repeat every 2 years.  Lung Cancer Screening: (Low Dose CT Chest recommended if Age 33-80 years, 20 pack-year currently smoking OR have quit w/in 15years.) does not qualify.   Lung Cancer Screening Referral: n/a  Additional Screening:  Hepatitis C Screening: does qualify; Completed 01/13/2018  Vision Screening: Recommended annual ophthalmology exams for early detection of glaucoma and other disorders of the eye. Is the patient up to date with their annual eye exam?  Yes  Who is the provider or what is the name of the office in which the patient attends annual eye exams? Dr. Earlene Plater If pt is not established with a provider, would they like to be referred to a provider to establish care? No .   Dental Screening: Recommended annual dental exams for proper oral hygiene  Diabetic Foot Exam: Diabetic Foot Exam: Overdue, Pt has been advised about the importance in completing this exam. Pt is  scheduled for diabetic foot exam on n/a.  Community Resource Referral / Chronic Care Management: CRR required this visit?  No   CCM required this visit?  No     Plan:     I have personally reviewed and noted the following in the patient's chart:   Medical and social history Use of alcohol, tobacco or illicit drugs  Current medications and supplements including opioid prescriptions. Patient is not currently taking opioid prescriptions. Functional ability and status Nutritional status Physical activity Advanced directives List of other physicians Hospitalizations, surgeries, and ER visits in previous 12 months Vitals Screenings to include cognitive, depression, and falls Referrals and appointments  In addition, I have reviewed and discussed with patient certain preventive protocols, quality metrics, and best practice recommendations. A written personalized care plan for preventive services as well as general preventive health recommendations were provided to patient.     Tamela Oddi, CMA   06/21/2023   After Visit Summary: (Pick Up) Due to this being a telephonic visit, with patients personalized plan was offered to patient and patient has requested to Pick up at office.  Nurse Notes:   Ms. Kesling , Thank you for taking time to come for your Medicare Wellness Visit. I appreciate your ongoing commitment to your health goals. Please review the following plan we discussed and let me know if I can assist you in the future.   These are the goals we discussed:  Goals   None     This is a list of the screening recommended for you and due dates:  Health Maintenance  Topic Date Due   DTaP/Tdap/Td vaccine (1 - Tdap) Never done   Colon Cancer Screening  Never done   COVID-19 Vaccine (5 - 2023-24 season) 08/10/2022   Flu Shot  07/11/2023   Medicare Annual Wellness Visit  06/20/2024   Mammogram  04/23/2025   Pneumonia Vaccine  Completed   DEXA scan (bone density  measurement)  Completed   Hepatitis C Screening  Completed   Zoster (Shingles) Vaccine  Completed   HPV Vaccine  Aged Out

## 2023-06-25 ENCOUNTER — Ambulatory Visit: Payer: Medicare PPO | Admitting: Physical Therapy

## 2023-06-25 NOTE — Progress Notes (Addendum)
Anesthesia Review:  PCP: Hetty Blend, NO LOV 06/21/23  Cardiologist : Will Camnitz- LOV 03/13/23  Chest x-ray : EKG : 03/13/23  CT Card- 2022  Echo : 2022  Stress test: 2020  Cardiac Cath :  Activity level:  Sleep Study/ CPAP : has cpap  Fasting Blood Sugar :      / Checks Blood Sugar -- times a day:   Blood Thinner/ Instructions /Last Dose: ASA / Instructions/ Last Dose :    Eliquis-    Prediabetrfes    Farxiga- hold for 72 hours prior to procedure  Ozempic- hold for 7 days prior to procedure    12/14/22- afib  ablaton    PT reports at time of preop phone cal she has had death in family and was going to Call DR Mansouraty and reschedule.  Instructed pt to call them on 06/25/23.

## 2023-06-26 DIAGNOSIS — H2512 Age-related nuclear cataract, left eye: Secondary | ICD-10-CM | POA: Diagnosis not present

## 2023-06-26 DIAGNOSIS — H11822 Conjunctivochalasis, left eye: Secondary | ICD-10-CM | POA: Diagnosis not present

## 2023-06-26 DIAGNOSIS — H11823 Conjunctivochalasis, bilateral: Secondary | ICD-10-CM | POA: Diagnosis not present

## 2023-06-27 ENCOUNTER — Encounter (HOSPITAL_COMMUNITY): Payer: Self-pay | Admitting: Gastroenterology

## 2023-06-28 ENCOUNTER — Ambulatory Visit (INDEPENDENT_AMBULATORY_CARE_PROVIDER_SITE_OTHER): Payer: Medicare PPO | Admitting: Gastroenterology

## 2023-06-28 ENCOUNTER — Telehealth: Payer: Self-pay

## 2023-06-28 ENCOUNTER — Encounter: Payer: Self-pay | Admitting: Gastroenterology

## 2023-06-28 VITALS — BP 128/70 | HR 55 | Ht 65.0 in | Wt 193.6 lb

## 2023-06-28 DIAGNOSIS — Z1211 Encounter for screening for malignant neoplasm of colon: Secondary | ICD-10-CM

## 2023-06-28 DIAGNOSIS — C49A0 Gastrointestinal stromal tumor, unspecified site: Secondary | ICD-10-CM | POA: Diagnosis not present

## 2023-06-28 MED ORDER — NA SULFATE-K SULFATE-MG SULF 17.5-3.13-1.6 GM/177ML PO SOLN: 1.0000 | ORAL | 0 refills | Status: DC

## 2023-06-28 NOTE — H&P (View-Only) (Signed)
GASTROENTEROLOGY OUTPATIENT CLINIC VISIT   Primary Care Provider Avanell Shackleton, NP-C [address] [place] [address] [phone]  Patient Profile: Claudia Cantrell is a 77 y.o. female with a pmh significant for paroxysmal atrial fibrillation (on anticoagulation), rheumatoid arthritis, ? 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  The patient presents to the [facility] for an evaluation and management of problem(s) noted below:  Problem List 1. Gastrointestinal stromal tumor (GIST) (HCC)   2. Colon cancer screening     History of Present Illness Please see prior notes for full details of HPI.  Interval History The patient returns for scheduled follow-up.  She has been doing well overall.  She has been working on weight loss and as a result of that is having improvement in many of her other issues and symptoms.  She has not had any new abdominal pain or discomfort.  She is interested in moving forward with colon cancer screening as well, but recalls her 2014 incomplete colonoscopy done elsewhere and wonders whether other modalities exist for screening.  GI Review of Systems Positive as above Negative for dysphagia, odynophagia, nausea, vomiting, change in bowel habits  Review of Systems General: Denies fevers/chills HEENT: Denies oral lesions Cardiovascular: Denies chest pain/palpitations Pulmonary: Denies shortness of breath/nocturnal cough Gastroenterological: See HPI Genitourinary: Denies darkened urine Hematological: Positive for easy bruising/bleeding as result of anticoagulation Dermatological: Denies jaundice Psychological: Mood is stable   Medications Current Outpatient Medications  Medication Sig Dispense Refill   acetaminophen (TYLENOL) 500 MG tablet Take 1,000 mg by mouth every 6 (six) hours as needed for moderate pain.      allopurinol (ZYLOPRIM) 100 MG tablet Take 1 tablet (100 mg total) by mouth daily. 30 tablet 2   amLODipine (NORVASC) 5 MG tablet TAKE 1 & 1/2 (ONE & ONE-HALF) TABLETS BY MOUTH IN THE EVENING 135 tablet 0   apixaban (ELIQUIS) 5 MG TABS tablet Take 1 tablet by mouth twice daily 180 tablet 1   carvedilol (COREG) 12.5 MG tablet Take 12.5 mg by mouth 2 (two) times daily with a meal.     cyanocobalamin (VITAMIN B12) 1000 MCG tablet Take 1,000 mcg by mouth daily.     FARXIGA 10 MG TABS tablet Take 10 mg by mouth in the morning.     furosemide (LASIX) 20 MG tablet Take 20 mg by mouth daily as needed for edema.     isosorbide dinitrate (ISORDIL) 5 MG tablet Take 1 tablet by mouth twice daily 180 tablet 0   Multiple Vitamins-Minerals (OCUVITE ADULT 50+ PO) Take by mouth.     Na Sulfate-K Sulfate-Mg Sulf (SUPREP BOWEL PREP KIT) 17.5-3.13-1.6 GM/177ML SOLN Take 1 kit by mouth as directed. For colonoscopy prep 354 mL 0   olmesartan (BENICAR) 40 MG tablet Take 40 mg by mouth daily.     OZEMPIC, 0.25 OR 0.5 MG/DOSE, 2 MG/3ML SOPN Inject 0.5 mg into the skin once a week.     Probiotic Product (PROBIOTIC PO) Take 1 capsule by mouth 2 (two) times a week.     rosuvastatin (CRESTOR) 40 MG tablet Take 1 tablet by mouth once daily 90 tablet 3   SYNTHROID 112 MCG tablet Take 1 tablet (112 mcg total) by mouth daily before breakfast. 90 tablet 2   UNABLE TO FIND Med Name: Advanced Metabolism Booster     Vitamin D, Ergocalciferol, (DRISDOL) 1.25 MG (50000 UNIT) CAPS capsule Take 1 capsule by mouth once  a week 5 capsule 0   Vitamin D3 (VITAMIN D) 25 MCG tablet Take 1,000 Units by mouth 2 (two) times a week.     No current facility-administered medications for this visit.    Allergies Allergies  Allergen Reactions   Labetalol     headaches   Codeine Anxiety    Histories Past Medical History:  Diagnosis Date   Arthritis    rheumatoid   Back pain    CAD (coronary artery disease)    a. 1992 s/p MI and PTCA of  unknown vessel;  b. 09/2010 Cath: LM nl, LAD 20p, LCX 4m, RCA 60m, RPL 20.   Cancer [facility])    Cervical cancer (HCC) 1977   Chest pain    Chronic kidney disease    Constipation    Family history of anesthesia complication    daughter has difficulty waking    GERD (gastroesophageal reflux disease)    occ   Gout 10/08/2016   History of heart attack    Hyperlipidemia    Hypertensive heart disease    Hypothyroidism    Joint pain    Nonischemic cardiomyopathy (HCC)    a. 07/2016 Echo: EF 40-45%, mild LVH, inferior akinesis, moderately dilated left atrium, trivial AI and MR.   PAF (paroxysmal atrial fibrillation) (HCC)    a. 07/2016 Admitted w/ AF RVR-->CHA2DS2VASc = 5-->Eliquis;  b. 07/2016 successful TEE/DCCV.   Pre-diabetes    Sleep apnea    a. Using CPAP.   SOB (shortness of breath)    Vitamin D deficiency    Past Surgical History:  Procedure Laterality Date   ABDOMINAL HYSTERECTOMY  1988   ATRIAL FIBRILLATION ABLATION N/A 07/28/2021   Procedure: ATRIAL FIBRILLATION ABLATION;  Surgeon: Regan Lemming, MD;  [facility]: [facility];  Service: Cardiovascular;  Laterality: N/A;   ATRIAL FIBRILLATION ABLATION N/A 12/14/2022   Procedure: ATRIAL FIBRILLATION ABLATION;  Surgeon: Regan Lemming, MD;  Location: [facility];  Service: Cardiovascular;  Laterality: N/A;   BACK SURGERY  1994   BIOPSY  09/02/2019   Procedure: BIOPSY;  Surgeon: Meridee Score Netty Starring., MD;  Location: [facility]COPY;  Service: Gastroenterology;;   BIOPSY  12/09/2019   Procedure: BIOPSY;  Surgeon: Lemar Lofty., MD;  Location: [facility];  Service: Gastroenterology;;   BIOPSY  03/08/2021   Procedure: BIOPSY;  Surgeon: Lemar Lofty., MD;  Location: [facility];  Service: Gastroenterology;;   CARDIAC CATHETERIZATION  1991 AND 1992   PTCA BY DR Meryl Crutch   CARDIOVERSION N/A 08/01/2016   Procedure: CARDIOVERSION;  Surgeon: Lewayne Bunting, MD;  Location: [facility] ENDOSCOPY;   Service: Cardiovascular;  Laterality: N/A;   CARDIOVERSION N/A 01/11/2020   Procedure: CARDIOVERSION;  Surgeon: Wendall Stade, MD;  Location: [facility] ENDOSCOPY;  Service: Cardiovascular;  Laterality: N/A;   CARDIOVERSION N/A 07/08/2020   Procedure: CARDIOVERSION;  Surgeon: Little Ishikawa, MD;  Location: [facility] ENDOSCOPY;  Service: Cardiovascular;  Laterality: N/A;   CARDIOVERSION N/A 05/31/2022   Procedure: CARDIOVERSION;  Surgeon: Chrystie Nose, MD;  Location: [facility] ENDOSCOPY;  Service: Cardiovascular;  Laterality: N/A;   CARDIOVERSION N/A 07/18/2022   Procedure: CARDIOVERSION;  Surgeon: Little Ishikawa, MD;  Location: [facility] ENDOSCOPY;  Service: Cardiovascular;  Laterality: N/A;   ENDOSCOPIC MUCOSAL RESECTION N/A 12/09/2019   Procedure: ENDOSCOPIC MUCOSAL RESECTION;  Surgeon: Lemar Lofty., MD;  Location: [facility] ENDOSCOPY;  Service: Gastroenterology;  Laterality: N/A;   ESOPHAGOGASTRODUODENOSCOPY N/A 03/08/2021   Procedure: ESOPHAGOGASTRODUODENOSCOPY (EGD);  Surgeon: Lemar Lofty.,  MD;  Location: [facility] ENDOSCOPY;  Service: Gastroenterology;  Laterality: N/A;   ESOPHAGOGASTRODUODENOSCOPY (EGD) WITH PROPOFOL N/A 09/02/2019   Procedure: ESOPHAGOGASTRODUODENOSCOPY (EGD) WITH PROPOFOL;  Surgeon: Meridee Score Netty Starring., MD;  Location: [facility] ENDOSCOPY;  Service: Gastroenterology;  Laterality: N/A;   ESOPHAGOGASTRODUODENOSCOPY (EGD) WITH PROPOFOL N/A 12/09/2019   Procedure: ESOPHAGOGASTRODUODENOSCOPY (EGD) WITH PROPOFOL;  Surgeon: Meridee Score Netty Starring., MD;  Location: [facility] ENDOSCOPY;  Service: Gastroenterology;  Laterality: N/A;   EUS N/A 09/02/2019   Procedure: UPPER ENDOSCOPIC ULTRASOUND (EUS) RADIAL;  Surgeon: Lemar Lofty., MD;  Location: [facility] ENDOSCOPY;  Service: Gastroenterology;  Laterality: N/A;  EUS radial/linear   EUS N/A 12/09/2019   Procedure: UPPER ENDOSCOPIC ULTRASOUND (EUS) RADIAL;  Surgeon: Lemar Lofty., MD;  Location: [facility] ENDOSCOPY;  Service: Gastroenterology;   Laterality: N/A;   EUS  12/09/2019   Procedure: UPPER ENDOSCOPIC ULTRASOUND (EUS) LINEAR;  Surgeon: Lemar Lofty., MD;  Location: [facility] ENDOSCOPY;  Service: Gastroenterology;;   EUS N/A 03/08/2021   Procedure: UPPER ENDOSCOPIC ULTRASOUND (EUS) RADIAL;  Surgeon: Lemar Lofty., MD;  Location: [facility] ENDOSCOPY;  Service: Gastroenterology;  Laterality: N/A;   FINE NEEDLE ASPIRATION  09/02/2019   Procedure: FINE NEEDLE ASPIRATION (FNA) LINEAR;  Surgeon: Lemar Lofty., MD;  Location: [facility] ENDOSCOPY;  Service: Gastroenterology;;   FINE NEEDLE ASPIRATION  12/09/2019   Procedure: FINE NEEDLE ASPIRATION (FNA) LINEAR;  Surgeon: Lemar Lofty., MD;  Location: [facility] ENDOSCOPY;  Service: Gastroenterology;;   HEMOSTASIS CLIP PLACEMENT  12/09/2019   Procedure: HEMOSTASIS CLIP PLACEMENT;  Surgeon: Lemar Lofty., MD;  Location: [facility] ENDOSCOPY;  Service: Gastroenterology;;   ORIF ANKLE FRACTURE Right 04/27/2015   Procedure: OPEN REDUCTION INTERNAL FIXATION (ORIF) RIGHT BIMALLEOLAR ANKLE FRACTURE WITH SYNDESMOSIS FIXATION;  Surgeon: Tarry Kos, MD;  Location: [facility] OR;  Service: Orthopedics;  Laterality: Right;   SUBMUCOSAL LIFTING INJECTION  12/09/2019   Procedure: SUBMUCOSAL LIFTING INJECTION;  Surgeon: Lemar Lofty., MD;  Location: [facility] ENDOSCOPY;  Service: Gastroenterology;;   TEE WITHOUT CARDIOVERSION N/A 08/01/2016   Procedure: TRANSESOPHAGEAL ECHOCARDIOGRAM (TEE);  Surgeon: Lewayne Bunting, MD;  Location: [facility] ENDOSCOPY;  Service: Cardiovascular;  Laterality: N/A;   THYROIDECTOMY  11/24/2013   DR Derrell Lolling   THYROIDECTOMY N/A 11/24/2013   Procedure: TOTAL THYROIDECTOMY;  Surgeon: Ernestene Mention, MD;  Location: [facility] OR;  Service: General;  Laterality: N/A;   TRANSTHORACIC ECHOCARDIOGRAM  01/15/2013   EF 55% TO 65%. PROBABLE MILD HYPOKINESIS OF THE INFERIOR MYOCARDIUM. GRADE 1 DIASTOLIC DYSFUNCTION. TRIAL AR.LA IS MILDLY DILATED.   Social History   Socioeconomic History    Marital status: Widowed    Spouse name: Not on file   Number of children: 3   Years of education: Not on file   Highest education level: Not on file  Occupational History   Occupation: retired  Tobacco Use   Smoking status: Former    Current packs/day: 0.00    Average packs/day: 1 pack/day for 15.0 years (15.0 ttl pk-yrs)    Types: Cigarettes    Start date: 12/11/1975    Quit date: 12/10/1990    Years since quitting: 32.5    Passive exposure: Never   Smokeless tobacco: Never   Tobacco comments:    Former smoker 05/21/22  Vaping Use   Vaping status: Never Used  Substance and Sexual Activity   Alcohol use: Yes    Comment: very rare   Drug use: No   Sexual activity: Not on file  Other Topics Concern   Not on file  Social History  Narrative   Right handed    Living  alone.   Social Determinants of Health   Financial Resource Strain: Low Risk  (06/21/2023)   Overall Financial Resource Strain (CARDIA)    Difficulty of Paying Living Expenses: Not very hard  Food Insecurity: No Food Insecurity (06/21/2023)   Hunger Vital Sign    Worried About Running Out of Food in the Last Year: Never true    Ran Out of Food in the Last Year: Never true  Transportation Needs: No Transportation Needs (06/21/2023)   PRAPARE - Administrator, Civil Service (Medical): No    Lack of Transportation (Non-Medical): No  Physical Activity: Insufficiently Active (06/21/2023)   Exercise Vital Sign    Days of Exercise per Week: 5 days    Minutes of Exercise per Session: 20 min  Stress: No Stress Concern Present (06/21/2023)   [employer] of Occupational Health - Occupational Stress Questionnaire    Feeling of Stress : Not at all  Social Connections: Socially Isolated (06/21/2023)   Social Connection and Isolation Panel [NHANES]    Frequency of Communication with Friends and Family: Twice a week    Frequency of Social Gatherings with Friends and Family: Twice a week    Attends Religious  Services: Never    Database administrator or Organizations: No    Attends Banker Meetings: Never    Marital Status: Widowed  Intimate Partner Violence: Not At Risk (06/21/2023)   Humiliation, Afraid, Rape, and Kick questionnaire    Fear of Current or Ex-Partner: No    Emotionally Abused: No    Physically Abused: No    Sexually Abused: No   Family History  Problem Relation Age of Onset   Heart disease Mother    Sudden death Mother    Diabetes Father    Stroke Father    Bone cancer Sister    Sudden death Brother    Melanoma Brother    Heart disease Brother    Colon cancer Neg Hx    Esophageal cancer Neg Hx    Inflammatory bowel disease Neg Hx    Liver disease Neg Hx    Pancreatic cancer Neg Hx    Rectal cancer Neg Hx    Stomach cancer Neg Hx    I have reviewed her medical, social, and family history in detail and updated the electronic medical record as necessary.    PHYSICAL EXAMINATION  BP 128/70   Pulse (!) 55   Ht 5\' 5"  (1.651 m)   Wt 193 lb 9.6 oz (87.8 kg)   SpO2 100%   BMI 32.22 kg/m  Wt Readings from Last 3 Encounters:  06/28/23 193 lb 9.6 oz (87.8 kg)  06/21/23 193 lb (87.5 kg)  06/21/23 193 lb (87.5 kg)  GEN: NAD, appears stated age, doesn't appear chronically ill PSYCH: Cooperative, without pressured speech EYE: Conjunctivae pink, sclerae anicteric ENT: MMM NECK: Supple CV: Non-tachycardic RESP: No audible wheezing GI: Soft, protuberant, rounded, nontender, no rebound MSK/EXT: No significant lower extremity edema SKIN: No jaundice NEURO:  Alert & Oriented x 3, no focal deficits   REVIEW OF DATA  I reviewed the following data at the time of this encounter:  GI Procedures and Studies  March 2022 upper EUS EGD Impression: - No gross lesions in esophagus. Z-line regular, 43 cm from the incisors. - 1 cm hiatal hernia. - Erythematous mucosa in the gastric fundus and gastric body. Stomach biopsied to rule out HP. - Gastric  Subepithelial  lesion on the greater curvature of the stomach (GIST based on prior biopsies). - No gross lesions in the duodenal bulb, in the first portion of the duodenum and in the second portion of the duodenum. EUS Impression: - An intramural (subepithelial) lesion was found in the body of the stomach. The lesion appeared to originate from within the muscularis propria (Layer 4). A tissue diagnosis was obtained prior to this exam in 2020. This is consistent with a stromal cell (smooth muscle) neoplasm. No significant changes in size over the last 1.5 years. CT scan showed this area to be approximately 13 mm in size. Overall, a good correlation with the CT scan. - No malignant-appearing lymph nodes were visualized in the paracardial region (level 16)left gastric region (level 17), gastrohepatic ligament (level 18), celiac region (level 20) and perigastric region.  Laboratory Studies  Reviewed in epic  Imaging Studies  March 2023 CT abdomen pelvis IMPRESSION: 1. Stable partially calcified 13 mm exophytic nodule along the distal ventral stomach. 2. Similar mild hepatomegaly with hepatic steatosis. 3. Sigmoid colonic diverticulosis without findings of acute diverticulitis. 4. Moderate volume of formed stool throughout the colon. 5.  Aortic Atherosclerosis (ICD10-I70.0).   ASSESSMENT  Ms. Millward is a 77 y.o. female with a pmh significant for paroxysmal atrial fibrillation (on anticoagulation), rheumatoid arthritis, ? 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  The patient is seen today for evaluation and management of:  1. Gastrointestinal stromal tumor (GIST) (HCC)   2. Colon cancer screening    The patient is clinically and hemodynamically stable.  Will plan to proceed with upper EUS for surveillance of her gastric GIST.  The risks of an EUS including intestinal perforation, bleeding, infection,  aspiration, and medication effects were discussed as was the possibility it may not give a definitive diagnosis if a biopsy is performed.  When a biopsy of the pancreas is done as part of the EUS, there is an additional risk of pancreatitis at the rate of about 1-2%.  It was explained that procedure related pancreatitis is typically mild, although it can be severe and even life threatening, which is why we do not perform random pancreatic biopsies and only biopsy a lesion/area we feel is concerning enough to warrant the risk.  The patient is due for colon cancer screening and although she had an incomplete colonoscopy previously, but it is worth [place] attempting and we have our pediatric colonoscope or ultrasound colonoscope that we can utilize if needed.  The risks and benefits of endoscopic evaluation were discussed with the patient; these include but are not limited to the risk of perforation, infection, bleeding, missed lesions, lack of diagnosis, severe illness requiring hospitalization, as well as anesthesia and sedation related illnesses.  The patient and/or family is agreeable to proceed.  We did discuss consideration of Cologuard testing but she is willing to move forward with colonoscopy.  She is already scheduled for her EUS and we will move forward with that but her colonoscopy will be scheduled in the coming months separately.  All patient questions were answered to the best of my ability, and the patient agrees to the aforementioned plan of action with follow-up as indicated.   PLAN  Proceed with already scheduled upper EUS for follow-up of GIST Proceed with scheduling colonoscopy for colon cancer screening Follow-up to be dictated based on results of above of these statins   Orders Placed This Encounter  Procedures   Ambulatory  referral to Gastroenterology    New Prescriptions   NA SULFATE-K SULFATE-MG SULF (SUPREP BOWEL PREP KIT) 17.5-3.13-1.6 GM/177ML SOLN    Take 1 kit by mouth as  directed. For colonoscopy prep   Modified Medications   No medications on file    Planned Follow Up No follow-ups on file.   Total Time in Face-to-Face and in Coordination of Care for patient including independent/personal interpretation/review of prior testing, medical history, examination, medication adjustment, communicating results with the patient directly, and documentation within the EHR is 25 minutes.   Corliss Parish, MD [facility] Advanced Endoscopy Office # [phone]

## 2023-06-28 NOTE — Telephone Encounter (Signed)
Request for surgical clearance:     Endoscopy Procedure  What type of surgery is being performed?     EUS   When is this surgery scheduled?     07/08/23  What type of clearance is required ?   Pharmacy  Are there any medications that need to be held prior to surgery and how long? Eliquis x2 days   Practice name and name of physician performing surgery?      [facility]-Dr Meridee Score Sharene Skeans B, CMA   What is your office phone and fax number?      Phone- [phone]  Fax- [phone]  Anesthesia type (None, local, MAC, general) ?       MAC

## 2023-06-28 NOTE — Telephone Encounter (Signed)
Patient with diagnosis of afib on Eliquis for anticoagulation.    Procedure: endoscopy  Date of procedure: 07/08/23  CHA2DS2-VASc Score = 6  This indicates a 9.7% annual risk of stroke. The patient's score is based upon: CHF History: 1 HTN History: 1 Diabetes History: 1 Stroke History: 0 Vascular Disease History: 1 Age Score: 1 Gender Score: 1   Afib ablation 12/14/22.  CrCl 57mL/min Platelet count 213K  CHADS2VASc score is currently 6, going to be 7 in a few days when she turns 77 which is before her procedure. Per office protocol, patient can hold Eliquis for 2 days prior to procedure as requested.  **This guidance is not considered finalized until pre-operative APP has relayed final recommendations.**

## 2023-06-28 NOTE — Patient Instructions (Signed)
We have sent the following medications to your pharmacy for you to pick up at your convenience: Suprep   You have been scheduled for a colonoscopy. Please follow written instructions given to you at your visit today.   Please pick up your prep supplies at the pharmacy within the next 1-3 days.  If you use inhalers (even only as needed), please bring them with you on the day of your procedure.  DO NOT TAKE 7 DAYS PRIOR TO TEST- Trulicity (dulaglutide) Ozempic, Wegovy (semaglutide) Mounjaro (tirzepatide) Bydureon Bcise (exanatide extended release)  DO NOT TAKE 1 DAY PRIOR TO YOUR TEST Rybelsus (semaglutide) Adlyxin (lixisenatide) Victoza (liraglutide) Byetta (exanatide) ___________________________________________________________________________  Bonita Quin will be contaced by our office prior to your procedure for directions on holding your Eliquis.  If you do not hear from our office 1 week prior to your scheduled procedure, please call [phone] to discuss.   Due to recent changes in healthcare laws, you may see the results of your imaging and laboratory studies on MyChart before your provider has had a chance to review them.  We understand that in some cases there may be results that are confusing or concerning to you. Not all laboratory results come back in the same time frame and the provider may be waiting for multiple results in order to interpret others.  Please give [place] 48 hours in order for your provider to thoroughly review all the results before contacting the office for clarification of your results.   _______________________________________________________  If your blood pressure at your visit was 140/90 or greater, please contact your primary care physician to follow up on this.  _______________________________________________________  If you are age 75 or older, your body mass index should be between 23-30. Your Body mass index is 32.22 kg/m. If this is out of the  aforementioned range listed, please consider follow up with your Primary Care Provider.  If you are age 75 or younger, your body mass index should be between 19-25. Your Body mass index is 32.22 kg/m. If this is out of the aformentioned range listed, please consider follow up with your Primary Care Provider.   ________________________________________________________  The [facility] providers would like to encourage you to use [facility] to communicate with providers for non-urgent requests or questions.  Due to long hold times on the telephone, sending your provider a message by [facility] may be a faster and more efficient way to get a response.  Please allow 48 business hours for a response.  Please remember that this is for non-urgent requests.  _______________________________________________________  Thank you for choosing me and [facility].  Dr. Meridee Score

## 2023-06-28 NOTE — Progress Notes (Unsigned)
GASTROENTEROLOGY OUTPATIENT CLINIC VISIT   Primary Care Provider Avanell Shackleton, NP-C [address] [place] [address] [phone]  Referring Provider Avanell Shackleton, NP-C [address] [place],  [place] [address] [phone]  Patient Profile: Claudia Cantrell is a 77 y.o. female with a pmh significant for paroxysmal atrial fibrillation (on anticoagulation), rheumatoid arthritis, ? Nonischemic cardiomyopathy hypertension, hyperlipidemia, GERD, hypothyroidism, kidney lesion, diverticulosis.  The patient presents to the [facility] for an evaluation and management of problem(s) noted below:  Problem List No diagnosis found.   History of Present Illness This is the patient's first visit to the outpatient of our GI clinic.  She was referred after finding on CT scan suggested a lesion abutting or within the stomach mucosa, somewhat exophytic as noted on cross-sectional imaging noted below.  She has not had any significant issues.  She notes daily bowel movements however infrequently will have constipation.  She infrequently has bloating.  She has had intentional weight loss as a result of trying to stay healthy and optimize her health.  She has had dysphagia in the past when she was dealing with her thyroid however after thyroid resection she no longer has dysphagia.  She denies any hematochezia or melena.  No abdominal pain.  The patient has had a prior colonoscopy which was reported as incomplete in 2014.  She may have had an upper endoscopy but that was greater than 15 to 20 years ago per report.  GI Review of Systems Positive as above including dietary pyrosis during indiscretions Negative for odynophagia, nausea, vomiting, change in bowel habits  Review of Systems General: Denies fevers/chills HEENT: Denies oral lesions Cardiovascular: Denies chest pain/palpitations Pulmonary: Denies shortness of breath/nocturnal cough Gastroenterological: See  HPI Genitourinary: Denies darkened urine Hematological: Positive for easy bruising/bleeding as result of anticoagulation Dermatological: Denies jaundice Psychological: Mood is stable   Medications Current Outpatient Medications  Medication Sig Dispense Refill   acetaminophen (TYLENOL) 500 MG tablet Take 1,000 mg by mouth every 6 (six) hours as needed for moderate pain.     allopurinol (ZYLOPRIM) 100 MG tablet Take 1 tablet (100 mg total) by mouth daily. 30 tablet 2   amLODipine (NORVASC) 5 MG tablet TAKE 1 & 1/2 (ONE & ONE-HALF) TABLETS BY MOUTH IN THE EVENING 135 tablet 0   apixaban (ELIQUIS) 5 MG TABS tablet Take 1 tablet by mouth twice daily 180 tablet 1   carvedilol (COREG) 12.5 MG tablet Take 12.5 mg by mouth 2 (two) times daily with a meal.     cyanocobalamin (VITAMIN B12) 1000 MCG tablet Take 1,000 mcg by mouth daily.     FARXIGA 10 MG TABS tablet Take 10 mg by mouth in the morning.     furosemide (LASIX) 20 MG tablet Take 20 mg by mouth daily as needed for edema.     isosorbide dinitrate (ISORDIL) 5 MG tablet Take 1 tablet by mouth twice daily 180 tablet 0   Multiple Vitamins-Minerals (OCUVITE ADULT 50+ PO) Take by mouth.     olmesartan (BENICAR) 40 MG tablet Take 40 mg by mouth daily.     OZEMPIC, 0.25 OR 0.5 MG/DOSE, 2 MG/3ML SOPN Inject 0.5 mg into the skin once a week.     Probiotic Product (PROBIOTIC PO) Take 1 capsule by mouth 2 (two) times a week.     rosuvastatin (CRESTOR) 40 MG tablet Take 1 tablet by mouth once daily 90 tablet 3   SYNTHROID 112 MCG tablet Take 1 tablet (112  mcg total) by mouth daily before breakfast. 90 tablet 2   UNABLE TO FIND Med Name: Advanced Metabolism Booster     Vitamin D, Ergocalciferol, (DRISDOL) 1.25 MG (50000 UNIT) CAPS capsule Take 1 capsule by mouth once a week 5 capsule 0   Vitamin D3 (VITAMIN D) 25 MCG tablet Take 1,000 Units by mouth 2 (two) times a week.     No current facility-administered medications for this visit.     Allergies Allergies  Allergen Reactions   Labetalol     headaches   Codeine Anxiety    Histories Past Medical History:  Diagnosis Date   Arthritis    rheumatoid   Back pain    CAD (coronary artery disease)    a. 1992 s/p MI and PTCA of unknown vessel;  b. 09/2010 Cath: LM nl, LAD 20p, LCX 31m, RCA 58m, RPL 20.   Cancer [facility])    Cervical cancer (HCC) 1977   Chest pain    Chronic kidney disease    Constipation    Family history of anesthesia complication    daughter has difficulty waking    GERD (gastroesophageal reflux disease)    occ   Gout 10/08/2016   History of heart attack    Hyperlipidemia    Hypertensive heart disease    Hypothyroidism    Joint pain    Nonischemic cardiomyopathy (HCC)    a. 07/2016 Echo: EF 40-45%, mild LVH, inferior akinesis, moderately dilated left atrium, trivial AI and MR.   PAF (paroxysmal atrial fibrillation) (HCC)    a. 07/2016 Admitted w/ AF RVR-->CHA2DS2VASc = 5-->Eliquis;  b. 07/2016 successful TEE/DCCV.   Pre-diabetes    Sleep apnea    a. Using CPAP.   SOB (shortness of breath)    Vitamin D deficiency    Past Surgical History:  Procedure Laterality Date   ABDOMINAL HYSTERECTOMY  1988   ATRIAL FIBRILLATION ABLATION N/A 07/28/2021   Procedure: ATRIAL FIBRILLATION ABLATION;  Surgeon: Regan Lemming, MD;  Location: [facility];  Service: Cardiovascular;  Laterality: N/A;   ATRIAL FIBRILLATION ABLATION N/A 12/14/2022   Procedure: ATRIAL FIBRILLATION ABLATION;  Surgeon: Regan Lemming, MD;  Location: [facility];  Service: Cardiovascular;  Laterality: N/A;   BACK SURGERY  1994   BIOPSY  09/02/2019   Procedure: BIOPSY;  Surgeon: Meridee Score Netty Starring., MD;  Location: [facility];  Service: Gastroenterology;;   BIOPSY  12/09/2019   Procedure: BIOPSY;  Surgeon: Lemar Lofty., MD;  Location: [facility];  Service: Gastroenterology;;   BIOPSY  03/08/2021   Procedure: BIOPSY;  Surgeon: Lemar Lofty., MD;  Location: [facility];  Service: Gastroenterology;;   CARDIAC CATHETERIZATION  1991 AND 1992   PTCA BY DR Meryl Crutch   CARDIOVERSION N/A 08/01/2016   Procedure: CARDIOVERSION;  Surgeon: Lewayne Bunting, MD;  Location: [facility] ENDOSCOPY;  Service: Cardiovascular;  Laterality: N/A;   CARDIOVERSION N/A 01/11/2020   Procedure: CARDIOVERSION;  Surgeon: Wendall Stade, MD;  Location: [facility] ENDOSCOPY;  Service: Cardiovascular;  Laterality: N/A;   CARDIOVERSION N/A 07/08/2020   Procedure: CARDIOVERSION;  Surgeon: Little Ishikawa, MD;  Location: [facility] ENDOSCOPY;  Service: Cardiovascular;  Laterality: N/A;   CARDIOVERSION N/A 05/31/2022   Procedure: CARDIOVERSION;  Surgeon: Chrystie Nose, MD;  Location: [facility] ENDOSCOPY;  Service: Cardiovascular;  Laterality: N/A;   CARDIOVERSION N/A 07/18/2022   Procedure: CARDIOVERSION;  Surgeon: Little Ishikawa, MD;  Location: [facility] ENDOSCOPY;  Service: Cardiovascular;  Laterality: N/A;   ENDOSCOPIC MUCOSAL  RESECTION N/A 12/09/2019   Procedure: ENDOSCOPIC MUCOSAL RESECTION;  Surgeon: Meridee Score Netty Starring., MD;  Location: [facility];  Service: Gastroenterology;  Laterality: N/A;   ESOPHAGOGASTRODUODENOSCOPY N/A 03/08/2021   Procedure: ESOPHAGOGASTRODUODENOSCOPY (EGD);  Surgeon: Lemar Lofty., MD;  Location: [facility];  Service: Gastroenterology;  Laterality: N/A;   ESOPHAGOGASTRODUODENOSCOPY (EGD) WITH PROPOFOL N/A 09/02/2019   Procedure: ESOPHAGOGASTRODUODENOSCOPY (EGD) WITH PROPOFOL;  Surgeon: Meridee Score Netty Starring., MD;  Location: [facility] ENDOSCOPY;  Service: Gastroenterology;  Laterality: N/A;   ESOPHAGOGASTRODUODENOSCOPY (EGD) WITH PROPOFOL N/A 12/09/2019   Procedure: ESOPHAGOGASTRODUODENOSCOPY (EGD) WITH PROPOFOL;  Surgeon: Meridee Score Netty Starring., MD;  Location: [facility] ENDOSCOPY;  Service: Gastroenterology;  Laterality: N/A;   EUS N/A 09/02/2019   Procedure: UPPER ENDOSCOPIC ULTRASOUND (EUS) RADIAL;  Surgeon: Lemar Lofty., MD;   Location: [facility] ENDOSCOPY;  Service: Gastroenterology;  Laterality: N/A;  EUS radial/linear   EUS N/A 12/09/2019   Procedure: UPPER ENDOSCOPIC ULTRASOUND (EUS) RADIAL;  Surgeon: Lemar Lofty., MD;  Location: [facility] ENDOSCOPY;  Service: Gastroenterology;  Laterality: N/A;   EUS  12/09/2019   Procedure: UPPER ENDOSCOPIC ULTRASOUND (EUS) LINEAR;  Surgeon: Lemar Lofty., MD;  Location: [facility];  Service: Gastroenterology;;   EUS N/A 03/08/2021   Procedure: UPPER ENDOSCOPIC ULTRASOUND (EUS) RADIAL;  Surgeon: Lemar Lofty., MD;  Location: [facility];  Service: Gastroenterology;  Laterality: N/A;   FINE NEEDLE ASPIRATION  09/02/2019   Procedure: FINE NEEDLE ASPIRATION (FNA) LINEAR;  Surgeon: Lemar Lofty., MD;  Location: [facility]COPY;  Service: Gastroenterology;;   FINE NEEDLE ASPIRATION  12/09/2019   Procedure: FINE NEEDLE ASPIRATION (FNA) LINEAR;  Surgeon: Lemar Lofty., MD;  Location: [facility];  Service: Gastroenterology;;   HEMOSTASIS CLIP PLACEMENT  12/09/2019   Procedure: HEMOSTASIS CLIP PLACEMENT;  Surgeon: Lemar Lofty., MD;  Location: [facility];  Service: Gastroenterology;;   ORIF ANKLE FRACTURE Right 04/27/2015   Procedure: OPEN REDUCTION INTERNAL FIXATION (ORIF) RIGHT BIMALLEOLAR ANKLE FRACTURE WITH SYNDESMOSIS FIXATION;  Surgeon: Tarry Kos, MD;  Location: [facility] OR;  Service: Orthopedics;  Laterality: Right;   SUBMUCOSAL LIFTING INJECTION  12/09/2019   Procedure: SUBMUCOSAL LIFTING INJECTION;  Surgeon: Lemar Lofty., MD;  Location: [facility] ENDOSCOPY;  Service: Gastroenterology;;   TEE WITHOUT CARDIOVERSION N/A 08/01/2016   Procedure: TRANSESOPHAGEAL ECHOCARDIOGRAM (TEE);  Surgeon: Lewayne Bunting, MD;  Location: [facility] ENDOSCOPY;  Service: Cardiovascular;  Laterality: N/A;   THYROIDECTOMY  11/24/2013   DR Derrell Lolling   THYROIDECTOMY N/A 11/24/2013   Procedure: TOTAL THYROIDECTOMY;  Surgeon: Ernestene Mention, MD;  Location: [facility] OR;   Service: General;  Laterality: N/A;   TRANSTHORACIC ECHOCARDIOGRAM  01/15/2013   EF 55% TO 65%. PROBABLE MILD HYPOKINESIS OF THE INFERIOR MYOCARDIUM. GRADE 1 DIASTOLIC DYSFUNCTION. TRIAL AR.LA IS MILDLY DILATED.   Social History   Socioeconomic History   Marital status: Widowed    Spouse name: Not on file   Number of children: 3   Years of education: Not on file   Highest education level: Not on file  Occupational History   Occupation: retired  Tobacco Use   Smoking status: Former    Current packs/day: 0.00    Average packs/day: 1 pack/day for 15.0 years (15.0 ttl pk-yrs)    Types: Cigarettes    Start date: 12/11/1975    Quit date: 12/10/1990    Years since quitting: 32.5    Passive exposure: Never   Smokeless tobacco: Never   Tobacco comments:    Former smoker 05/21/22  Vaping Use   Vaping status: Never Used  Substance and Sexual Activity   Alcohol use: Yes    Comment: very rare   Drug use: No   Sexual activity: Not on file  Other Topics Concern   Not on file  Social History Narrative   Right handed    Living  alone.   Social Determinants of Health   Financial Resource Strain: Low Risk  (06/21/2023)   Overall Financial Resource Strain (CARDIA)    Difficulty of Paying Living Expenses: Not very hard  Food Insecurity: No Food Insecurity (06/21/2023)   Hunger Vital Sign    Worried About Running Out of Food in the Last Year: Never true    Ran Out of Food in the Last Year: Never true  Transportation Needs: No Transportation Needs (06/21/2023)   PRAPARE - Administrator, Civil Service (Medical): No    Lack of Transportation (Non-Medical): No  Physical Activity: Insufficiently Active (06/21/2023)   Exercise Vital Sign    Days of Exercise per Week: 5 days    Minutes of Exercise per Session: 20 min  Stress: No Stress Concern Present (06/21/2023)   [employer] of Occupational Health - Occupational Stress Questionnaire    Feeling of Stress : Not at all  Social  Connections: Socially Isolated (06/21/2023)   Social Connection and Isolation Panel [NHANES]    Frequency of Communication with Friends and Family: Twice a week    Frequency of Social Gatherings with Friends and Family: Twice a week    Attends Religious Services: Never    Database administrator or Organizations: No    Attends Banker Meetings: Never    Marital Status: Widowed  Intimate Partner Violence: Not At Risk (06/21/2023)   Humiliation, Afraid, Rape, and Kick questionnaire    Fear of Current or Ex-Partner: No    Emotionally Abused: No    Physically Abused: No    Sexually Abused: No   Family History  Problem Relation Age of Onset   Heart disease Mother    Sudden death Mother    Diabetes Father    Stroke Father    Bone cancer Sister    Sudden death Brother    Melanoma Brother    Heart disease Brother    Colon cancer Neg Hx    Esophageal cancer Neg Hx    Inflammatory bowel disease Neg Hx    Liver disease Neg Hx    Pancreatic cancer Neg Hx    Rectal cancer Neg Hx    Stomach cancer Neg Hx    I have reviewed her medical, social, and family history in detail and updated the electronic medical record as necessary.    PHYSICAL EXAMINATION  BP 128/70   Pulse (!) 55   Ht 5\' 5"  (1.651 m)   Wt 193 lb 9.6 oz (87.8 kg)   SpO2 100%   BMI 32.22 kg/m  Wt Readings from Last 3 Encounters:  06/28/23 193 lb 9.6 oz (87.8 kg)  06/21/23 193 lb (87.5 kg)  06/21/23 193 lb (87.5 kg)  GEN: NAD, appears stated age, doesn't appear chronically ill PSYCH: Cooperative, without pressured speech EYE: Conjunctivae pink, sclerae anicteric ENT: MMM NECK: Supple, enlarged neck girth CV: Non-tachycardic RESP: No wheezing apparent GI: Soft, protuberant MSK/EXT: No significant lower extremity edema SKIN: No jaundice NEURO:  Alert & Oriented x 3, no focal deficits   REVIEW OF DATA  I reviewed the following data at the time of this encounter:  GI Procedures and Studies  2014  colonoscopy Do  not have access to report however noted to be incomplete leading to barium enema as noted below  Laboratory Studies  Reviewed in epic  Imaging Studies  May 2020 CT abdomen with/without contrast IMPRESSION: 1. Benign Bosniak category 2 left renal cyst, which corresponds with the lesion seen on recent ultrasound. No evidence of renal mass or hydronephrosis. 2. 1.4 cm enhancing soft tissue nodule along the serosal surface of the distal gastric body, suspicious for GI stromal tumor. Recommend gastroenterology consultation, and consideration of endoscopic ultrasound for further evaluation. Aortic Atherosclerosis (ICD10-I70.0).  February 2014 barium enema IMPRESSION: Significant diverticular disease of the distal descending colon and rectosigmoid colon with spasm.  No persistent polypoid or constricting lesion is evident.  This study is somewhat compromised as described above.   ASSESSMENT  Ms. Danek is a 77 y.o. female with a pmh significant for paroxysmal atrial fibrillation (on anticoagulation), rheumatoid arthritis, ? Nonischemic cardiomyopathy hypertension, hyperlipidemia, GERD, hypothyroidism, kidney lesion, diverticulosis.  The patient is seen today for evaluation and management of:  No diagnosis found.  The patient is hemodynamically and clinically stable.  No significant GI complaints.  Incidental finding of a submucosal/exophytic lesion of the stomach.  I reviewed the imaging this may end up being a leiomyoma or gist.  It remains very small.  Worthwhile for an attempt at EUS evaluation and possible sampling via FNA/FNB if able to be visualized.  If unable to be visualized with EUS or samples, may require surveillance.  The risks and benefits of endoscopic evaluation were discussed with the patient; these include but are not limited to the risk of perforation, infection, bleeding, missed lesions, lack of diagnosis, severe illness requiring hospitalization, as well as  anesthesia and sedation related illnesses.  The patient is agreeable to proceed.  The risks and benefits of endoscopic evaluation were discussed with the patient; these include but are not limited to the risk of perforation, infection, bleeding, missed lesions, lack of diagnosis, severe illness requiring hospitalization, as well as anesthesia and sedation related illnesses.  The patient is agreeable to proceed.    PLAN  Laboratories as outlined below before EUS Schedule EUS with possible FNB of gastric submucosal/exophytic lesion Further recommendations after EUS   No orders of the defined types were placed in this encounter.   New Prescriptions   No medications on file   Modified Medications   No medications on file    Planned Follow Up No follow-ups on file.   Corliss Parish, MD [facility] Office # [phone]

## 2023-06-28 NOTE — Telephone Encounter (Signed)
   Patient Name: Claudia Cantrell  DOB: [birth year] MRN: [id number]  Primary Cardiologist: Will Jorja Loa, MD  Clinical pharmacists have reviewed the patient's past medical history, labs, and current medications as part of preoperative protocol coverage. The following recommendations have been made:  Patient with diagnosis of afib on Eliquis for anticoagulation.     Procedure: endoscopy   Date of procedure: 07/08/23   CHA2DS2-VASc Score = 6  This indicates a 9.7% annual risk of stroke. The patient's score is based upon: CHF History: 1 HTN History: 1 Diabetes History: 1 Stroke History: 0 Vascular Disease History: 1 Age Score: 1 Gender Score: 1   Afib ablation 12/14/22.   CrCl 1mL/min Platelet count 213K   CHADS2VASc score is currently 6, going to be 7 in a few days when she turns 77 which is before her procedure. Per office protocol, patient can hold Eliquis for 2 days prior to procedure as requested. Please resume Eliquis as soon as possible postprocedure, at the discretion of the surgeon.     I will route this recommendation to the requesting party via Epic fax function and remove from pre-op pool.  Please call with questions.  Joylene Grapes, NP 06/28/2023, 4:17 PM

## 2023-06-30 ENCOUNTER — Encounter: Payer: Self-pay | Admitting: Gastroenterology

## 2023-07-01 DIAGNOSIS — N1832 Chronic kidney disease, stage 3b: Secondary | ICD-10-CM | POA: Diagnosis not present

## 2023-07-01 DIAGNOSIS — R7303 Prediabetes: Secondary | ICD-10-CM | POA: Diagnosis not present

## 2023-07-01 NOTE — Telephone Encounter (Signed)
Patient has been informed ok to hold Eliquis x2 days prior to procedure per Cardiology. Patient voiced understanding.

## 2023-07-02 ENCOUNTER — Ambulatory Visit: Payer: Medicare PPO | Admitting: Physical Therapy

## 2023-07-02 DIAGNOSIS — M6281 Muscle weakness (generalized): Secondary | ICD-10-CM | POA: Diagnosis not present

## 2023-07-02 DIAGNOSIS — R2689 Other abnormalities of gait and mobility: Secondary | ICD-10-CM

## 2023-07-02 DIAGNOSIS — R531 Weakness: Secondary | ICD-10-CM | POA: Diagnosis not present

## 2023-07-02 NOTE — Progress Notes (Signed)
Attempted to obtain medical history via telephone, unable to reach at this time. HIPAA compliant voicemail message left requesting return call to pre surgical testing department. 

## 2023-07-02 NOTE — Therapy (Signed)
OUTPATIENT PHYSICAL THERAPY NEURO TREATMENT   Patient Name: Claudia Cantrell MRN: [id number] DOB:10/08/1946, 77 y.o., female Today's Date: 07/02/2023   PCP: Avanell Shackleton, NP-C REFERRING PROVIDER: Irene Limbo, FNP  END OF SESSION:  PT End of Session - 07/02/23 1108     Visit Number 2    Number of Visits 7    Date for PT Re-Evaluation 08/07/23   Due to potential delay in scheduling   Authorization Type [employer]    PT Start Time 1104    PT Stop Time 1140   Pt requested to end session due to fatigue   PT Time Calculation (min) 36 min    Equipment Utilized During Treatment Gait belt    Activity Tolerance Patient tolerated treatment well;Patient limited by fatigue    Behavior During Therapy [facility] for tasks assessed/performed              Past Medical History:  Diagnosis Date   Arthritis    rheumatoid   Back pain    CAD (coronary artery disease)    a. 1992 s/p MI and PTCA of unknown vessel;  b. 09/2010 Cath: LM nl, LAD 20p, LCX 70m, RCA 80m, RPL 20.   Cancer [facility])    Cervical cancer (HCC) 1977   Chest pain    Chronic kidney disease    Constipation    Family history of anesthesia complication    daughter has difficulty waking    GERD (gastroesophageal reflux disease)    occ   Gout 10/08/2016   History of heart attack    Hyperlipidemia    Hypertensive heart disease    Hypothyroidism    Joint pain    Nonischemic cardiomyopathy (HCC)    a. 07/2016 Echo: EF 40-45%, mild LVH, inferior akinesis, moderately dilated left atrium, trivial AI and MR.   PAF (paroxysmal atrial fibrillation) (HCC)    a. 07/2016 Admitted w/ AF RVR-->CHA2DS2VASc = 5-->Eliquis;  b. 07/2016 successful TEE/DCCV.   Pre-diabetes    Sleep apnea    a. Using CPAP.   SOB (shortness of breath)    Vitamin D deficiency    Past Surgical History:  Procedure Laterality Date   ABDOMINAL HYSTERECTOMY  1988   ATRIAL FIBRILLATION ABLATION N/A 07/28/2021   Procedure: ATRIAL FIBRILLATION ABLATION;   Surgeon: Regan Lemming, MD;  [facility]: [facility] INVASIVE CV LAB;  Service: Cardiovascular;  Laterality: N/A;   ATRIAL FIBRILLATION ABLATION N/A 12/14/2022   Procedure: ATRIAL FIBRILLATION ABLATION;  Surgeon: Regan Lemming, MD;  Location: [facility] INVASIVE CV LAB;  Service: Cardiovascular;  Laterality: N/A;   BACK SURGERY  1994   BIOPSY  09/02/2019   Procedure: BIOPSY;  Surgeon: Meridee Score Netty Starring., MD;  Location: [facility] ENDOSCOPY;  Service: Gastroenterology;;   BIOPSY  12/09/2019   Procedure: BIOPSY;  Surgeon: Lemar Lofty., MD;  Location: [facility] ENDOSCOPY;  Service: Gastroenterology;;   BIOPSY  03/08/2021   Procedure: BIOPSY;  Surgeon: Lemar Lofty., MD;  Location: [facility] ENDOSCOPY;  Service: Gastroenterology;;   CARDIAC CATHETERIZATION  1991 AND 1992   PTCA BY DR Meryl Crutch   CARDIOVERSION N/A 08/01/2016   Procedure: CARDIOVERSION;  Surgeon: Lewayne Bunting, MD;  Location: [facility] ENDOSCOPY;  Service: Cardiovascular;  Laterality: N/A;   CARDIOVERSION N/A 01/11/2020   Procedure: CARDIOVERSION;  Surgeon: Wendall Stade, MD;  Location: [facility] ENDOSCOPY;  Service: Cardiovascular;  Laterality: N/A;   CARDIOVERSION N/A 07/08/2020   Procedure: CARDIOVERSION;  Surgeon: Little Ishikawa, MD;  Location: [facility] ENDOSCOPY;  Service: Cardiovascular;  Laterality: N/A;   CARDIOVERSION N/A 05/31/2022   Procedure: CARDIOVERSION;  Surgeon: Chrystie Nose, MD;  Location: [facility] ENDOSCOPY;  Service: Cardiovascular;  Laterality: N/A;   CARDIOVERSION N/A 07/18/2022   Procedure: CARDIOVERSION;  Surgeon: Little Ishikawa, MD;  Location: [facility] ENDOSCOPY;  Service: Cardiovascular;  Laterality: N/A;   ENDOSCOPIC MUCOSAL RESECTION N/A 12/09/2019   Procedure: ENDOSCOPIC MUCOSAL RESECTION;  Surgeon: Meridee Score Netty Starring., MD;  Location: [facility] ENDOSCOPY;  Service: Gastroenterology;  Laterality: N/A;   ESOPHAGOGASTRODUODENOSCOPY N/A 03/08/2021   Procedure: ESOPHAGOGASTRODUODENOSCOPY (EGD);  Surgeon: Lemar Lofty., MD;  Location: [facility] ENDOSCOPY;  Service: Gastroenterology;  Laterality: N/A;   ESOPHAGOGASTRODUODENOSCOPY (EGD) WITH PROPOFOL N/A 09/02/2019   Procedure: ESOPHAGOGASTRODUODENOSCOPY (EGD) WITH PROPOFOL;  Surgeon: Meridee Score Netty Starring., MD;  Location: [facility] ENDOSCOPY;  Service: Gastroenterology;  Laterality: N/A;   ESOPHAGOGASTRODUODENOSCOPY (EGD) WITH PROPOFOL N/A 12/09/2019   Procedure: ESOPHAGOGASTRODUODENOSCOPY (EGD) WITH PROPOFOL;  Surgeon: Meridee Score Netty Starring., MD;  Location: [facility] ENDOSCOPY;  Service: Gastroenterology;  Laterality: N/A;   EUS N/A 09/02/2019   Procedure: UPPER ENDOSCOPIC ULTRASOUND (EUS) RADIAL;  Surgeon: Lemar Lofty., MD;  Location: [facility] ENDOSCOPY;  Service: Gastroenterology;  Laterality: N/A;  EUS radial/linear   EUS N/A 12/09/2019   Procedure: UPPER ENDOSCOPIC ULTRASOUND (EUS) RADIAL;  Surgeon: Lemar Lofty., MD;  Location: [facility] ENDOSCOPY;  Service: Gastroenterology;  Laterality: N/A;   EUS  12/09/2019   Procedure: UPPER ENDOSCOPIC ULTRASOUND (EUS) LINEAR;  Surgeon: Lemar Lofty., MD;  Location: [facility] ENDOSCOPY;  Service: Gastroenterology;;   EUS N/A 03/08/2021   Procedure: UPPER ENDOSCOPIC ULTRASOUND (EUS) RADIAL;  Surgeon: Lemar Lofty., MD;  Location: [facility] ENDOSCOPY;  Service: Gastroenterology;  Laterality: N/A;   FINE NEEDLE ASPIRATION  09/02/2019   Procedure: FINE NEEDLE ASPIRATION (FNA) LINEAR;  Surgeon: Lemar Lofty., MD;  Location: [facility] ENDOSCOPY;  Service: Gastroenterology;;   FINE NEEDLE ASPIRATION  12/09/2019   Procedure: FINE NEEDLE ASPIRATION (FNA) LINEAR;  Surgeon: Lemar Lofty., MD;  Location: [facility] ENDOSCOPY;  Service: Gastroenterology;;   HEMOSTASIS CLIP PLACEMENT  12/09/2019   Procedure: HEMOSTASIS CLIP PLACEMENT;  Surgeon: Lemar Lofty., MD;  Location: [facility] ENDOSCOPY;  Service: Gastroenterology;;   ORIF ANKLE FRACTURE Right 04/27/2015   Procedure: OPEN REDUCTION INTERNAL FIXATION (ORIF) RIGHT  BIMALLEOLAR ANKLE FRACTURE WITH SYNDESMOSIS FIXATION;  Surgeon: Tarry Kos, MD;  Location: [facility] OR;  Service: Orthopedics;  Laterality: Right;   SUBMUCOSAL LIFTING INJECTION  12/09/2019   Procedure: SUBMUCOSAL LIFTING INJECTION;  Surgeon: Lemar Lofty., MD;  Location: [facility] ENDOSCOPY;  Service: Gastroenterology;;   TEE WITHOUT CARDIOVERSION N/A 08/01/2016   Procedure: TRANSESOPHAGEAL ECHOCARDIOGRAM (TEE);  Surgeon: Lewayne Bunting, MD;  Location: [facility] ENDOSCOPY;  Service: Cardiovascular;  Laterality: N/A;   THYROIDECTOMY  11/24/2013   DR Derrell Lolling   THYROIDECTOMY N/A 11/24/2013   Procedure: TOTAL THYROIDECTOMY;  Surgeon: Ernestene Mention, MD;  Location: [facility] OR;  Service: General;  Laterality: N/A;   TRANSTHORACIC ECHOCARDIOGRAM  01/15/2013   EF 55% TO 65%. PROBABLE MILD HYPOKINESIS OF THE INFERIOR MYOCARDIUM. GRADE 1 DIASTOLIC DYSFUNCTION. TRIAL AR.LA IS MILDLY DILATED.   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/2022   Acquired hypothyroidism 10/30/2021   Stromal tumor determined by gastric biopsy 06/24/2020   Iatrogenic hyperthyroidism 05/02/2020   Dyslipidemia, goal LDL below 70 05/02/2020   Acute combined systolic and diastolic CHF, NYHA class 3 (HCC) 09/23/2019   Persistent atrial fibrillation (HCC) 09/21/2019   Submucosal lesion of stomach 07/19/2019   Abnormal CT of the abdomen 07/19/2019   Abnormal CT scan, stomach 06/17/2019   Advance directive declined by patient  01/14/2019   Chronic anticoagulation 01/14/2019   CRI (chronic renal insufficiency), stage 2 (mild) 01/14/2019   Prediabetes 01/14/2019   Persistent proteinuria 02/17/2018   Hyperuricemia 03/26/2017   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   Vitamin D deficiency 03/26/2017   Medication monitoring encounter 03/26/2017   Gout 10/08/2016   Essential hypertension    CAD S/P percutaneous coronary angioplasty    Nonischemic cardiomyopathy (HCC)    Chest pain with moderate risk for cardiac etiology 08/02/2016   Closed right ankle fracture 04/24/2015   Ankle fracture, bimalleolar, closed 04/24/2015   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   OSA on CPAP 10/09/2013    ONSET DATE: 06/04/2023 (referral)  REFERRING DIAG: R26.89 (ICD-10-CM) - Balance problem R53.1 (ICD-10-CM) - Weakness  THERAPY DIAG:  Muscle weakness (generalized)  Other abnormalities of gait and mobility  Rationale for Evaluation and Treatment: Rehabilitation  SUBJECTIVE:                                                                                                                                                                                             SUBJECTIVE STATEMENT: Pt reports doing well. States she has been marching while watching TV and is walking at home. Has not joined the [employer] yet, still plans to. No falls. "Is it weird that I talk to myself?"   Pt accompanied by: self  PERTINENT HISTORY: Cardiac Ablation on 12/14/22, scheduled for cataract surgery on 06/26/23. Scheduled for GI endoscopy on 07/08/23  PAIN:  Are you having pain? No  PRECAUTIONS: Fall  WEIGHT BEARING RESTRICTIONS: No  FALLS: Has patient fallen in last 6 months? No  LIVING ENVIRONMENT: Lives with: lives alone Lives in: House/apartment Stairs: Yes: External: 5-7 steps; bilateral but cannot reach both Has following equipment at home: Walker - 2 wheeled and shower chair  PLOF: Independent  PATIENT GOALS: "just to be  able to strengthen my muscles"   VITALS  There were no vitals filed for this visit.   OBJECTIVE:   DIAGNOSTIC FINDINGS: None that apply to POC  COGNITION: Overall cognitive status: Within functional limits for tasks  assessed   SENSATION: Pt denies numbness/tingling   COORDINATION: Heel to shin test: WNL bilaterally   EDEMA: Chronic swelling in L ankle, does not take diuretic every day   POSTURE: rounded shoulders, forward head, and flexed trunk    LOWER EXTREMITY MMT:  Tested in seated position   MMT Right Eval Left Eval  Hip flexion 3+ 3+  Hip extension    Hip abduction 4 4  Hip adduction 3+ 3+  Hip internal rotation    Hip external rotation    Knee flexion 4 4  Knee extension 4+ 4+  Ankle dorsiflexion 4+ 4+  Ankle plantarflexion    Ankle inversion    Ankle eversion    (Blank rows = not tested)  TODAY'S TREATMENT:     Ther Act   Gait pattern: [facility] Distance walked: 115' loop completed 11x + 20' = 1285'  Assistive device utilized: None Level of assistance: Complete Independence Comments: Pt talking throughout assessment and frequently asking how much time remained. Min cues to sustain attention to task.   Ther Ex  Modified sit to stand thrusters w/2 5# dumbbells, 2x6 reps, for improved amplitude of movement, posterior chain strength and core stability. Pt performed well and stated she could "feel the burn". Pt required min concurrent cues to maintain sequence of movement, as she could not recall despite performing multiple reps.  Seated bicep curls x20 reps w/5# dumbbells. Min cues for supinated hand position rather than pronated throughout.  Demonstrated proper deadlift form and had pt perform part-practice via sliding her hands down her thighs to her knees and then standing back up to facilitate hip hinge, x8 reps. Progressed to performing traditional deadlifts w/10# KB, x10 reps, for functional strengthening. Pt required mod cues for proper body mechanics  initially, but overall performed well. Pt states "this wiped me out" and requested to leave session early so she could go to [employer] and buy some duumbbells to use at home.     GAIT: Gait pattern: WFL Distance walked: Various clinic distances  Assistive device utilized: None Level of assistance: Complete Independence                                                                                                                           PATIENT EDUCATION: Education details: Continued encouragement to join [employer] to transition to group classes to [employer] strength gains and improve endurance, where to purchase dumbbells  Person educated: Patient Education method: Explanation Education comprehension: verbalized understanding  HOME EXERCISE PROGRAM: To be established   GOALS: Goals reviewed with patient? Yes  SHORT TERM GOALS: Target date: 07/10/2023   Pt will be independent with initial HEP for improved strength, transfers and gait.  Baseline: not established on eval  Goal status: INITIAL  2.  Pt will improve 5 x STS to less than or equal to 28 seconds without UE support to demonstrate improved functional strength and transfer efficiency.   Baseline: 32.5s w/hands braced on knees  Goal status: INITIAL  3.  to be assessed and LTG updated  Baseline:  Goal status: MET   LONG TERM GOALS: Target date: 07/24/2023   Pt will transition to community-based gym [facility]) to participate in group classes and perform gym-based HEP for maintained functional gains and endurance  Baseline: pt to join local [employer] on 7/3 Goal status: INITIAL  2.  Pt will ambulate greater than or equal to 1350 feet on for improved cardiovascular endurance and BLE strength.   Baseline: 1285' Goal status: REVISED   3.  Pt will improve 5 x STS to less than or equal to 25 seconds without UE support to demonstrate improved functional strength and transfer efficiency.   Baseline: 32.5s Goal status:  INITIAL   ASSESSMENT:  CLINICAL IMPRESSION: Emphasis of skilled PT session on endurance assessment and functional strength training. Pt moving nonstop throughout session, kicking her legs while seated or performing windmills, as she states therapist "told her to move". Reiterated importance of exercise/active lifestyle but that pt is not required to move 24/7. Pt ambulated 1285' during , which is slightly below age-adjusted norms but pt was more focused on talking during test, requiring cues to sustain attention to task. Pt very motivated to improve her strength and workout at home and requested to leave session early to go buy dumbbells for home use. Continue POC.    OBJECTIVE IMPAIRMENTS: decreased activity tolerance, decreased endurance, decreased mobility, difficulty walking, decreased strength, and obesity  ACTIVITY LIMITATIONS: carrying, bending, stairs, and locomotion level  PARTICIPATION LIMITATIONS: shopping, community activity, and yard work  PERSONAL FACTORS: Age, Past/current experiences, and 1 comorbidity: HTN  are also affecting patient's functional outcome.   REHAB POTENTIAL: Good  CLINICAL DECISION MAKING: Stable/uncomplicated  EVALUATION COMPLEXITY: Low  PLAN:  PT FREQUENCY: 1x/week  PT DURATION: 8 weeks (pt scheduled for 6 weeks, POC written for 8 due to potential scheduling delay)   PLANNED INTERVENTIONS: Therapeutic exercises, Therapeutic activity, Neuromuscular re-education, Balance training, Gait training, Patient/Family education, Self Care, Joint mobilization, Stair training, Aquatic Therapy, Dry Needling, Manual therapy, and Re-evaluation  PLAN FOR NEXT SESSION: dumbbells? Did she join [employer]? Establish HEP for BLE strength, endurance. Work on floor transfers to imitate gardening.    Jill Alexanders Nguyet Mercer, PT, DPT 07/02/2023, 11:41 AM

## 2023-07-03 ENCOUNTER — Ambulatory Visit: Payer: Medicare PPO | Admitting: Nurse Practitioner

## 2023-07-03 DIAGNOSIS — N1832 Chronic kidney disease, stage 3b: Secondary | ICD-10-CM | POA: Diagnosis not present

## 2023-07-03 DIAGNOSIS — E559 Vitamin D deficiency, unspecified: Secondary | ICD-10-CM | POA: Diagnosis not present

## 2023-07-03 DIAGNOSIS — R600 Localized edema: Secondary | ICD-10-CM | POA: Diagnosis not present

## 2023-07-03 DIAGNOSIS — R809 Proteinuria, unspecified: Secondary | ICD-10-CM | POA: Diagnosis not present

## 2023-07-03 DIAGNOSIS — I129 Hypertensive chronic kidney disease with stage 1 through stage 4 chronic kidney disease, or unspecified chronic kidney disease: Secondary | ICD-10-CM | POA: Diagnosis not present

## 2023-07-03 DIAGNOSIS — N2581 Secondary hyperparathyroidism of renal origin: Secondary | ICD-10-CM | POA: Diagnosis not present

## 2023-07-06 NOTE — Anesthesia Preprocedure Evaluation (Signed)
Anesthesia Evaluation  Patient identified by MRN, date of birth, ID band Patient awake    Reviewed: Allergy & Precautions, NPO status , Patient's Chart, lab work & pertinent test results  History of Anesthesia Complications (+) Family history of anesthesia reaction and history of anesthetic complications (daughter with difficulty waking up)  Airway Mallampati: III  TM Distance: >3 FB Neck ROM: Full   Comment: Previous grade I view with Mac 3, mask ventilation with OPA Dental  (+) Partial Lower, Partial Upper, Dental Advisory Given   Pulmonary neg shortness of breath, sleep apnea and Continuous Positive Airway Pressure Ventilation , neg COPD, neg recent URI, former smoker   Pulmonary exam normal breath sounds clear to auscultation       Cardiovascular hypertension (amlodipine, carvedilol, furosemide, ISDN, losartan), Pt. on medications and Pt. on home beta blockers (-) angina + CAD, + Past MI (1994), + Cardiac Stents and +CHF (NICM)  (-) CABG + dysrhythmias (on amiodarone and Eliquis)  Rhythm:Regular Rate:Normal  HLD  TTE 04/24/2021: IMPRESSIONS     1. Left ventricular ejection fraction, by estimation, is 55 to 60%. The  left ventricle has normal function. The left ventricle has no regional  wall motion abnormalities. There is mild left ventricular hypertrophy.  Left ventricular diastolic parameters  are indeterminate.   2. Right ventricular systolic function is normal. The right ventricular  size is normal.   3. Left atrial size was mildly dilated.   4. The mitral valve is normal in structure. Mild mitral valve  regurgitation. No evidence of mitral stenosis.   5. The aortic valve is normal in structure. Aortic valve regurgitation is  mild. No aortic stenosis is present.   6. The inferior vena cava is normal in size with greater than 50%  respiratory variability, suggesting right atrial pressure of 3 mmHg.      Neuro/Psych negative neurological ROS     GI/Hepatic Neg liver ROS,GERD  ,,  Endo/Other  Hypothyroidism  Pre-diabetes (Hgb A1c 5.7)  Renal/GU CRFRenal disease     Musculoskeletal  (+) Arthritis , Rheumatoid disorders,    Abdominal  (+) + obese  Peds  Hematology negative hematology ROS (+)   Anesthesia Other Findings Gout   Reproductive/Obstetrics H/o cervical cancer in 1977                             Anesthesia Physical Anesthesia Plan  ASA: 3  Anesthesia Plan: MAC   Post-op Pain Management: Minimal or no pain anticipated   Induction: Intravenous  PONV Risk Score and Plan: 3 and Treatment may vary due to age or medical condition and Propofol infusion  Airway Management Planned: Natural Airway and Simple Face Mask  Additional Equipment: None  Intra-op Plan:   Post-operative Plan:   Informed Consent: I have reviewed the patients History and Physical, chart, labs and discussed the procedure including the risks, benefits and alternatives for the proposed anesthesia with the patient or authorized representative who has indicated his/her understanding and acceptance.     Dental advisory given  Plan Discussed with: Anesthesiologist and CRNA  Anesthesia Plan Comments:         Anesthesia Quick Evaluation

## 2023-07-08 ENCOUNTER — Other Ambulatory Visit: Payer: Self-pay | Admitting: Physician Assistant

## 2023-07-08 ENCOUNTER — Ambulatory Visit: Payer: Medicare PPO | Admitting: Physical Therapy

## 2023-07-08 ENCOUNTER — Ambulatory Visit (HOSPITAL_BASED_OUTPATIENT_CLINIC_OR_DEPARTMENT_OTHER): Payer: Medicare PPO | Admitting: Anesthesiology

## 2023-07-08 ENCOUNTER — Other Ambulatory Visit: Payer: Self-pay

## 2023-07-08 ENCOUNTER — Encounter (HOSPITAL_COMMUNITY): Payer: Self-pay | Admitting: Gastroenterology

## 2023-07-08 ENCOUNTER — Encounter (HOSPITAL_COMMUNITY)
Admission: RE | Disposition: A | Discharge status: Home / Self Care | Payer: Self-pay | Source: Home / Self Care | Attending: Gastroenterology

## 2023-07-08 ENCOUNTER — Ambulatory Visit (HOSPITAL_COMMUNITY): Payer: Medicare PPO | Admitting: Anesthesiology

## 2023-07-08 ENCOUNTER — Ambulatory Visit (HOSPITAL_COMMUNITY): Admission: RE | Admit: 2023-07-08 | Payer: Medicare PPO | Source: Home / Self Care | Admitting: Gastroenterology

## 2023-07-08 DIAGNOSIS — K319 Disease of stomach and duodenum, unspecified: Secondary | ICD-10-CM

## 2023-07-08 DIAGNOSIS — I4819 Other persistent atrial fibrillation: Secondary | ICD-10-CM

## 2023-07-08 DIAGNOSIS — K2289 Other specified disease of esophagus: Secondary | ICD-10-CM | POA: Insufficient documentation

## 2023-07-08 DIAGNOSIS — I2510 Atherosclerotic heart disease of native coronary artery without angina pectoris: Secondary | ICD-10-CM

## 2023-07-08 DIAGNOSIS — G4733 Obstructive sleep apnea (adult) (pediatric): Secondary | ICD-10-CM | POA: Diagnosis not present

## 2023-07-08 DIAGNOSIS — K2970 Gastritis, unspecified, without bleeding: Secondary | ICD-10-CM

## 2023-07-08 DIAGNOSIS — K2950 Unspecified chronic gastritis without bleeding: Secondary | ICD-10-CM | POA: Diagnosis not present

## 2023-07-08 DIAGNOSIS — E039 Hypothyroidism, unspecified: Secondary | ICD-10-CM | POA: Diagnosis not present

## 2023-07-08 DIAGNOSIS — K449 Diaphragmatic hernia without obstruction or gangrene: Secondary | ICD-10-CM | POA: Insufficient documentation

## 2023-07-08 DIAGNOSIS — K3189 Other diseases of stomach and duodenum: Secondary | ICD-10-CM | POA: Diagnosis not present

## 2023-07-08 DIAGNOSIS — C49A3 Gastrointestinal stromal tumor of small intestine: Secondary | ICD-10-CM | POA: Diagnosis not present

## 2023-07-08 DIAGNOSIS — M1A09X0 Idiopathic chronic gout, multiple sites, without tophus (tophi): Secondary | ICD-10-CM

## 2023-07-08 DIAGNOSIS — C49A0 Gastrointestinal stromal tumor, unspecified site: Secondary | ICD-10-CM

## 2023-07-08 DIAGNOSIS — E790 Hyperuricemia without signs of inflammatory arthritis and tophaceous disease: Secondary | ICD-10-CM

## 2023-07-08 DIAGNOSIS — Z87891 Personal history of nicotine dependence: Secondary | ICD-10-CM | POA: Insufficient documentation

## 2023-07-08 HISTORY — PX: ESOPHAGOGASTRODUODENOSCOPY (EGD) WITH PROPOFOL: SHX5813

## 2023-07-08 HISTORY — PX: UPPER ESOPHAGEAL ENDOSCOPIC ULTRASOUND (EUS): SHX6562

## 2023-07-08 HISTORY — PX: BIOPSY: SHX5522

## 2023-07-08 LAB — GLUCOSE, CAPILLARY: Glucose-Capillary: 81 mg/dL (ref 70–99)

## 2023-07-08 SURGERY — UPPER ESOPHAGEAL ENDOSCOPIC ULTRASOUND (EUS)
Anesthesia: Monitor Anesthesia Care

## 2023-07-08 MED ORDER — PROPOFOL 500 MG/50ML IV EMUL
INTRAVENOUS | Status: DC | PRN
  Administered 2023-07-08: 100 ug/kg/min via INTRAVENOUS

## 2023-07-08 MED ORDER — LIDOCAINE 2% (20 MG/ML) 5 ML SYRINGE
INTRAMUSCULAR | Status: DC | PRN
  Administered 2023-07-08: 50 mg via INTRAVENOUS

## 2023-07-08 MED ORDER — APIXABAN 5 MG PO TABS
5.0000 mg | ORAL_TABLET | Freq: Two times a day (BID) | ORAL | Status: DC
Start: 2023-07-09 — End: 2023-12-09

## 2023-07-08 MED ORDER — LACTATED RINGERS IV SOLN: INTRAVENOUS | Status: DC

## 2023-07-08 MED ORDER — SODIUM CHLORIDE 0.9 % IV SOLN: INTRAVENOUS | Status: DC

## 2023-07-08 MED ORDER — PROPOFOL 10 MG/ML IV BOLUS
INTRAVENOUS | Status: DC | PRN
  Administered 2023-07-08: 10 mg via INTRAVENOUS

## 2023-07-08 MED ORDER — PROPOFOL 500 MG/50ML IV EMUL
INTRAVENOUS | Status: AC
  Filled 2023-07-08: qty 50

## 2023-07-08 MED ORDER — ONDANSETRON HCL 4 MG/2ML IJ SOLN
INTRAMUSCULAR | Status: DC | PRN
  Administered 2023-07-08: 4 mg via INTRAVENOUS

## 2023-07-08 NOTE — Transfer of Care (Signed)
Immediate Anesthesia Transfer of Care Note  Patient: Claudia Cantrell  Procedure(s) Performed: UPPER ESOPHAGEAL ENDOSCOPIC ULTRASOUND (EUS) BIOPSY ESOPHAGOGASTRODUODENOSCOPY (EGD) WITH PROPOFOL  Patient Location: PACU and Endoscopy Unit  Anesthesia Type:MAC  Level of Consciousness: drowsy  Airway & Oxygen Therapy: Patient Spontanous Breathing and Patient connected to face mask oxygen  Post-op Assessment: Report given to RN and Post -op Vital signs reviewed and stable  Post vital signs: Reviewed and stable  Last Vitals:  Vitals Value Taken Time  BP    Temp    Pulse    Resp    SpO2      Last Pain:  Vitals:   07/08/23 0814  TempSrc: Temporal  PainSc: 0-No pain         Complications: No notable events documented.

## 2023-07-08 NOTE — Interval H&P Note (Signed)
History and Physical Interval Note:  07/08/2023 7:55 AM  Claudia Cantrell  has presented today for surgery, with the diagnosis of GIST, Gastric Subepithelial Lesion.  The various methods of treatment have been discussed with the patient and family. After consideration of risks, benefits and other options for treatment, the patient has consented to  Procedure(s): UPPER ESOPHAGEAL ENDOSCOPIC ULTRASOUND (EUS) (N/A) as a surgical intervention.  The patient's history has been reviewed, patient examined, no change in status, stable for surgery.  I have reviewed the patient's chart and labs.  Questions were answered to the patient's satisfaction.    The risks of an EUS including intestinal perforation, bleeding, infection, aspiration, and medication effects were discussed as was the possibility it may not give a definitive diagnosis if a biopsy is performed.  When a biopsy of the pancreas is done as part of the EUS, there is an additional risk of pancreatitis at the rate of about 1-2%.  It was explained that procedure related pancreatitis is typically mild, although it can be severe and even life threatening, which is why we do not perform random pancreatic biopsies and only biopsy a lesion/area we feel is concerning enough to warrant the risk.    [employer]

## 2023-07-08 NOTE — Anesthesia Procedure Notes (Signed)
Procedure Name: MAC Date/Time: 07/08/2023 9:13 AM  Performed by: Orest Dikes, CRNAPre-anesthesia Checklist: Patient identified, Emergency Drugs available, Suction available and Patient being monitored Oxygen Delivery Method: Simple face mask

## 2023-07-08 NOTE — Anesthesia Postprocedure Evaluation (Signed)
Anesthesia Post Note  Patient: Claudia Cantrell  Procedure(s) Performed: UPPER ESOPHAGEAL ENDOSCOPIC ULTRASOUND (EUS) BIOPSY ESOPHAGOGASTRODUODENOSCOPY (EGD) WITH PROPOFOL     Patient location during evaluation: PACU Anesthesia Type: MAC Level of consciousness: awake and alert Pain management: pain level controlled Vital Signs Assessment: post-procedure vital signs reviewed and stable Respiratory status: spontaneous breathing, nonlabored ventilation, respiratory function stable and patient connected to nasal cannula oxygen Cardiovascular status: stable and blood pressure returned to baseline Postop Assessment: no apparent nausea or vomiting Anesthetic complications: no   No notable events documented.  Last Vitals:  Vitals:   07/08/23 0955 07/08/23 1000  BP: (!) 149/73 (!) 161/78  Pulse: (!) 56 (!) 55  Resp: 17 20  Temp:    SpO2: 100% 100%    Last Pain:  Vitals:   07/08/23 0955  TempSrc:   PainSc: 0-No pain                 Chardonay Scritchfield

## 2023-07-08 NOTE — Op Note (Signed)
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 Henson Medicines:                Monitored Anesthesia Care Complications:            No immediate complications. Estimated Blood Loss:     Estimated blood loss was minimal. Procedure:                Pre-Anesthesia Assessment:                           - Prior to the procedure, a History and Physical                            was performed, and patient medications and                            allergies were reviewed. The patient's tolerance of                            previous anesthesia was also reviewed. The risks                            and benefits of the procedure and the sedation                            options and risks were discussed with the patient.                            All questions were answered, and informed consent                            was obtained. Prior Anticoagulants: The patient has                            taken Eliquis (apixaban), last dose was 2 days  prior to procedure. ASA Grade Assessment: III - A                            patient with severe systemic disease. After                            reviewing the risks and benefits, the patient was                            deemed in satisfactory condition to undergo the                            procedure.                           After  obtaining informed consent, the endoscope was                            passed under direct vision. Throughout the                            procedure, the patient's blood pressure, pulse, and                            oxygen saturations were monitored continuously. The                            [id number]-H190 ([id number] Olympus endoscope was introduced                            through the mouth, and advanced to the second part                            of duodenum. The GF-UE190-[id number] ([id number] Olympus                            radial ultrasound scope was introduced through the                            mouth, and advanced to the stomach for ultrasound                            examination. The upper EUS was accomplished without                            difficulty. The patient tolerated the procedure. Scope In: Scope Out: Findings:      ENDOSCOPIC FINDING: :      No gross lesions were noted in the entire esophagus.      The Z-line was irregular and was found 37 cm from the incisors.      A 2 cm hiatal hernia was present.      A medium-sized, subepithelial lesion with no bleeding and no stigmata of       recent bleeding was found in the proximal gastric antrum.  Patchy mild inflammation characterized by erythema and granularity was       found in the cardia, in the gastric fundus and in the gastric body.      No other gross lesions were noted in the entire examined stomach.       Biopsies were taken with a cold forceps for histology and Helicobacter       pylori testing.      No gross lesions were noted in the duodenal bulb, in the first portion       of the duodenum and in the second portion of the duodenum.      ENDOSONOGRAPHIC FINDING: :      A round intramural (subepithelial) lesion was found in the proximal       antrum of the stomach. The lesion was hypoechoic and had some       calcifications. Sonographically, the lesion appeared to originate from       the muscularis  propria (Layer 4). The lesion measured 14.3 mm (in       maximum thickness). The lesion also measured 12 mm in diameter. The       outer endosonographic borders were well defined.      Endosonographic images of the stomach were unremarkable. No wall       thickening was identified.      Endosonographic imaging in the visualized portion of the liver showed no       mass.      No malignant-appearing lymph nodes were visualized in the celiac region       (level 20) and perigastric region.      The celiac region was visualized. Impression:               EGD impression:                           - No gross lesions in the entire esophagus. Z-line                            irregular, 37 cm from the incisors.                           - 2 cm hiatal hernia.                           - Subepithelial lesion in the proximal gastric                            antrum.                           - Gastritis proximally. No other gross lesions in                            the entire stomach. Biopsied.                           - No gross lesions in the duodenal bulb, in the                            first portion of the duodenum and in the  second                            portion of the duodenum.                           EUS impression:                           - An intramural (subepithelial) lesion was found in                            the antrum of the stomach. The lesion appeared to                            originate from within the muscularis propria (Layer                            4). A tissue diagnosis was obtained prior to this                            exam. This is consistent with a stromal cell                            (smooth muscle) neoplasm.                           - Endosonographic images in the rest of the stomach                            were unremarkable.                           - No malignant-appearing lymph nodes were                            visualized in the  celiac region (level 20) and                            perigastric region. Moderate Sedation:      Not Applicable - Patient had care per Anesthesia. Recommendation:           - The patient will be observed post-procedure,                            until all discharge criteria are met.                           - Discharge patient to home.                           - Patient has a contact number available for                            emergencies. The signs and symptoms of potential  delayed complications were discussed with the                            patient. Return to normal activities tomorrow.                            Written discharge instructions were provided to the                            patient.                           - Resume previous diet.                           - Pending gastric pathology, consider low-dose PPI.                           - Observe patient's clinical course.                           - Will plan repeat CT abdomen/pelvis in 1 year for                            follow-up of GIST. If all remains stable, we will                            then consider pushing out GIST follow-up                            surveillance to 2 years with EUS but will see the                            CT scan first.                           - Await path results.                           - The findings and recommendations were discussed                            with the patient.                           - The findings and recommendations were discussed                            with the designated responsible adult. Procedure Code(s):        --- Professional ---                           [phone], Esophagogastroduodenoscopy, flexible,                            transoral; with endoscopic ultrasound examination  limited to the esophagus, stomach or duodenum, and                            adjacent structures                            43239, Esophagogastroduodenoscopy, flexible,                            transoral; with biopsy, single or multiple Diagnosis Code(s):        --- Professional ---                           K22.89, Other specified disease of esophagus                           K44.9, Diaphragmatic hernia without obstruction or                            gangrene                           D49.0, Neoplasm of unspecified behavior of                            digestive system                           K29.70, Gastritis, unspecified, without bleeding                           K31.89, Other diseases of stomach and duodenum                           I89.9, Noninfective disorder of lymphatic vessels                            and lymph nodes, unspecified                           C49.A3, Gastrointestinal stromal tumor of small                            intestine                           K92.9, Disease of digestive system, unspecified CPT copyright 2022 [employer]. All rights reserved. The codes documented in this report are preliminary and upon coder review may  be revised to meet current compliance requirements. Corliss Parish, MD 07/08/2023 10:10:31 AM Number of Addenda: 0

## 2023-07-08 NOTE — Discharge Instructions (Signed)

## 2023-07-09 ENCOUNTER — Encounter: Payer: Self-pay | Admitting: Gastroenterology

## 2023-07-10 DIAGNOSIS — H11821 Conjunctivochalasis, right eye: Secondary | ICD-10-CM | POA: Diagnosis not present

## 2023-07-10 DIAGNOSIS — H2511 Age-related nuclear cataract, right eye: Secondary | ICD-10-CM | POA: Diagnosis not present

## 2023-07-11 ENCOUNTER — Encounter (HOSPITAL_COMMUNITY): Payer: Self-pay | Admitting: Gastroenterology

## 2023-07-11 HISTORY — PX: COLONOSCOPY: SHX174

## 2023-07-15 ENCOUNTER — Ambulatory Visit: Payer: Medicare PPO | Attending: Nurse Practitioner | Admitting: Physical Therapy

## 2023-07-15 DIAGNOSIS — R2689 Other abnormalities of gait and mobility: Secondary | ICD-10-CM | POA: Diagnosis not present

## 2023-07-15 DIAGNOSIS — M6281 Muscle weakness (generalized): Secondary | ICD-10-CM | POA: Insufficient documentation

## 2023-07-15 NOTE — Therapy (Signed)
OUTPATIENT PHYSICAL THERAPY NEURO TREATMENT   Patient Name: Claudia Cantrell MRN: [id number] DOB:03-Apr-1946, 77 y.o., female Today's Date: 07/15/2023   PCP: Avanell Shackleton, NP-C REFERRING PROVIDER: Irene Limbo, FNP  END OF SESSION:  PT End of Session - 07/15/23 1023     Visit Number 3    Number of Visits 7    Date for PT Re-Evaluation 08/07/23   Due to potential delay in scheduling   Authorization Type [employer]    PT Start Time 1021   Previous pt session ran late   PT Stop Time 1102    PT Time Calculation (min) 41 min    Equipment Utilized During Treatment --    Activity Tolerance Patient tolerated treatment well    Behavior During Therapy [facility] for tasks assessed/performed               Past Medical History:  Diagnosis Date   Arthritis    rheumatoid   Back pain    CAD (coronary artery disease)    a. 1992 s/p MI and PTCA of unknown vessel;  b. 09/2010 Cath: LM nl, LAD 20p, LCX 35m, RCA 9m, RPL 20.   Cancer [facility])    Cervical cancer (HCC) 1977   Chest pain    Chronic kidney disease    Constipation    Family history of anesthesia complication    daughter has difficulty waking    GERD (gastroesophageal reflux disease)    occ   Gout 10/08/2016   History of heart attack    Hyperlipidemia    Hypertensive heart disease    Hypothyroidism    Joint pain    Nonischemic cardiomyopathy (HCC)    a. 07/2016 Echo: EF 40-45%, mild LVH, inferior akinesis, moderately dilated left atrium, trivial AI and MR.   PAF (paroxysmal atrial fibrillation) (HCC)    a. 07/2016 Admitted w/ AF RVR-->CHA2DS2VASc = 5-->Eliquis;  b. 07/2016 successful TEE/DCCV.   Pre-diabetes    Sleep apnea    a. Using CPAP.   SOB (shortness of breath)    Vitamin D deficiency    Past Surgical History:  Procedure Laterality Date   ABDOMINAL HYSTERECTOMY  1988   ATRIAL FIBRILLATION ABLATION N/A 07/28/2021   Procedure: ATRIAL FIBRILLATION ABLATION;  Surgeon: Regan Lemming, MD;  Location:  [facility]AS[facility];  Service: Cardiovascular;  Laterality: N/A;   ATRIAL FIBRILLATION ABLATION N/A 12/14/2022   Procedure: ATRIAL FIBRILLATION ABLATION;  Surgeon: Regan Lemming, MD;  Location: [facility] CV [facility];  Service: Cardiovascular;  Laterality: N/A;   BACK SURGERY  1994   BIOPSY  09/02/2019   Procedure: BIOPSY;  Surgeon: Meridee Score Netty Starring., MD;  Location: [facility] ENDOSCOPY;  Service: Gastroenterology;;   BIOPSY  12/09/2019   Procedure: BIOPSY;  Surgeon: Lemar Lofty., MD;  Location: [facility]OSCOPY;  Service: Gastroenterology;;   BIOPSY  03/08/2021   Procedure: BIOPSY;  Surgeon: Lemar Lofty., MD;  Location: [facility]OSCOPY;  Service: Gastroenterology;;   BIOPSY  07/08/2023   Procedure: BIOPSY;  Surgeon: Lemar Lofty., MD;  Location: [facility] ENDOSCOPY;  Service: Gastroenterology;;   CARDIAC CATHETERIZATION  1991 AND 1992   PTCA BY DR Meryl Crutch   CARDIOVERSION N/A 08/01/2016   Procedure: CARDIOVERSION;  Surgeon: Lewayne Bunting, MD;  Location: [facility] ENDOSCOPY;  Service: Cardiovascular;  Laterality: N/A;   CARDIOVERSION N/A 01/11/2020   Procedure: CARDIOVERSION;  Surgeon: Wendall Stade, MD;  Location: [facility] ENDOSCOPY;  Service: Cardiovascular;  Laterality: N/A;   CARDIOVERSION N/A 07/08/2020  Procedure: CARDIOVERSION;  Surgeon: Little Ishikawa, MD;  Location: [facility] ENDOSCOPY;  Service: Cardiovascular;  Laterality: N/A;   CARDIOVERSION N/A 05/31/2022   Procedure: CARDIOVERSION;  Surgeon: Chrystie Nose, MD;  Location: [facility] ENDOSCOPY;  Service: Cardiovascular;  Laterality: N/A;   CARDIOVERSION N/A 07/18/2022   Procedure: CARDIOVERSION;  Surgeon: Little Ishikawa, MD;  Location: [facility] ENDOSCOPY;  Service: Cardiovascular;  Laterality: N/A;   ENDOSCOPIC MUCOSAL RESECTION N/A 12/09/2019   Procedure: ENDOSCOPIC MUCOSAL RESECTION;  Surgeon: Meridee Score Netty Starring., MD;  Location: [facility] ENDOSCOPY;  Service: Gastroenterology;  Laterality: N/A;   ESOPHAGOGASTRODUODENOSCOPY  N/A 03/08/2021   Procedure: ESOPHAGOGASTRODUODENOSCOPY (EGD);  Surgeon: Lemar Lofty., MD;  Location: [facility]COPY;  Service: Gastroenterology;  Laterality: N/A;   ESOPHAGOGASTRODUODENOSCOPY (EGD) WITH PROPOFOL N/A 09/02/2019   Procedure: ESOPHAGOGASTRODUODENOSCOPY (EGD) WITH PROPOFOL;  Surgeon: Meridee Score Netty Starring., MD;  Location: [facility] ENDOSCOPY;  Service: Gastroenterology;  Laterality: N/A;   ESOPHAGOGASTRODUODENOSCOPY (EGD) WITH PROPOFOL N/A 12/09/2019   Procedure: ESOPHAGOGASTRODUODENOSCOPY (EGD) WITH PROPOFOL;  Surgeon: Meridee Score Netty Starring., MD;  Location: [facility] ENDOSCOPY;  Service: Gastroenterology;  Laterality: N/A;   ESOPHAGOGASTRODUODENOSCOPY (EGD) WITH PROPOFOL N/A 07/08/2023   Procedure: ESOPHAGOGASTRODUODENOSCOPY (EGD) WITH PROPOFOL;  Surgeon: Meridee Score Netty Starring., MD;  Location: [facility] ENDOSCOPY;  Service: Gastroenterology;  Laterality: N/A;   EUS N/A 09/02/2019   Procedure: UPPER ENDOSCOPIC ULTRASOUND (EUS) RADIAL;  Surgeon: Lemar Lofty., MD;  Location: [facility] ENDOSCOPY;  Service: Gastroenterology;  Laterality: N/A;  EUS radial/linear   EUS N/A 12/09/2019   Procedure: UPPER ENDOSCOPIC ULTRASOUND (EUS) RADIAL;  Surgeon: Lemar Lofty., MD;  Location: [facility] ENDOSCOPY;  Service: Gastroenterology;  Laterality: N/A;   EUS  12/09/2019   Procedure: UPPER ENDOSCOPIC ULTRASOUND (EUS) LINEAR;  Surgeon: Lemar Lofty., MD;  Location: [facility] ENDOSCOPY;  Service: Gastroenterology;;   EUS N/A 03/08/2021   Procedure: UPPER ENDOSCOPIC ULTRASOUND (EUS) RADIAL;  Surgeon: Lemar Lofty., MD;  Location: [facility] ENDOSCOPY;  Service: Gastroenterology;  Laterality: N/A;   FINE NEEDLE ASPIRATION  09/02/2019   Procedure: FINE NEEDLE ASPIRATION (FNA) LINEAR;  Surgeon: Lemar Lofty., MD;  Location: [facility] ENDOSCOPY;  Service: Gastroenterology;;   FINE NEEDLE ASPIRATION  12/09/2019   Procedure: FINE NEEDLE ASPIRATION (FNA) LINEAR;  Surgeon: Lemar Lofty., MD;  Location:  [facility] ENDOSCOPY;  Service: Gastroenterology;;   HEMOSTASIS CLIP PLACEMENT  12/09/2019   Procedure: HEMOSTASIS CLIP PLACEMENT;  Surgeon: Lemar Lofty., MD;  Location: [facility] ENDOSCOPY;  Service: Gastroenterology;;   ORIF ANKLE FRACTURE Right 04/27/2015   Procedure: OPEN REDUCTION INTERNAL FIXATION (ORIF) RIGHT BIMALLEOLAR ANKLE FRACTURE WITH SYNDESMOSIS FIXATION;  Surgeon: Tarry Kos, MD;  Location: [facility] OR;  Service: Orthopedics;  Laterality: Right;   SUBMUCOSAL LIFTING INJECTION  12/09/2019   Procedure: SUBMUCOSAL LIFTING INJECTION;  Surgeon: Lemar Lofty., MD;  Location: [facility] ENDOSCOPY;  Service: Gastroenterology;;   TEE WITHOUT CARDIOVERSION N/A 08/01/2016   Procedure: TRANSESOPHAGEAL ECHOCARDIOGRAM (TEE);  Surgeon: Lewayne Bunting, MD;  Location: [facility] ENDOSCOPY;  Service: Cardiovascular;  Laterality: N/A;   THYROIDECTOMY  11/24/2013   DR Derrell Lolling   THYROIDECTOMY N/A 11/24/2013   Procedure: TOTAL THYROIDECTOMY;  Surgeon: Ernestene Mention, MD;  Location: [facility] OR;  Service: General;  Laterality: N/A;   TRANSTHORACIC ECHOCARDIOGRAM  01/15/2013   EF 55% TO 65%. PROBABLE MILD HYPOKINESIS OF THE INFERIOR MYOCARDIUM. GRADE 1 DIASTOLIC DYSFUNCTION. TRIAL AR.LA IS MILDLY DILATED.   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/28/2022   Hypercoagulable state due to persistent atrial fibrillation (HCC) 05/21/2022   Overactive bladder 05/13/2022   Mixed hyperlipidemia 05/09/2022    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/2022   Acquired hypothyroidism 10/30/2021   Stromal tumor determined by gastric biopsy 06/24/2020   Iatrogenic hyperthyroidism 05/02/2020   Dyslipidemia, goal LDL below 70 05/02/2020   Acute combined systolic and diastolic CHF, NYHA class 3 (HCC) 09/23/2019   Persistent atrial fibrillation (HCC) 09/21/2019   Submucosal lesion of stomach 07/19/2019   Abnormal CT of the abdomen 07/19/2019   Abnormal CT scan, stomach 06/17/2019   Advance directive declined by patient 01/14/2019   Chronic anticoagulation 01/14/2019   CRI (chronic renal insufficiency), stage 2 (mild) 01/14/2019   Prediabetes 01/14/2019   Persistent proteinuria 02/17/2018   Hyperuricemia 03/26/2017   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   Vitamin D deficiency 03/26/2017   Medication monitoring encounter 03/26/2017   Gout 10/08/2016   Essential hypertension    CAD S/P percutaneous coronary angioplasty    Nonischemic cardiomyopathy (HCC)    Chest pain with moderate risk for cardiac etiology 08/02/2016   Closed right ankle fracture 04/24/2015   Ankle fracture, bimalleolar, closed 04/24/2015   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   OSA on CPAP 10/09/2013    ONSET DATE: 06/04/2023 (referral)  REFERRING DIAG: R26.89 (ICD-10-CM) - Balance problem R53.1 (ICD-10-CM) - Weakness  THERAPY DIAG:  Muscle weakness (generalized)  Other abnormalities of gait and mobility  Rationale for Evaluation and Treatment: Rehabilitation  SUBJECTIVE:                                                                                                                                                                                             SUBJECTIVE STATEMENT: Pt reports doing okay. Had an endoscopy last Monday and an eye procedure last weekend, so had to miss her PT appointment last week. Did get some dumbbells and kettlebells and worked on the exercises from  last PT session. Has not joined the [employer].    Pt accompanied by: self  PERTINENT HISTORY: Cardiac Ablation on 12/14/22, scheduled for cataract surgery on 06/26/23. Scheduled for GI endoscopy on 07/08/23  PAIN:  Are you having pain? No  PRECAUTIONS: Fall  WEIGHT BEARING RESTRICTIONS: No  FALLS: Has patient fallen in last 6 months? No  LIVING ENVIRONMENT: Lives with: lives alone Lives in: House/apartment Stairs: Yes: External: 5-7 steps; bilateral but cannot reach both Has following equipment at home: Walker - 2 wheeled and shower chair  PLOF: Independent  PATIENT GOALS: "just to be able to strengthen my muscles"   VITALS  There were no vitals filed for this visit.   OBJECTIVE:   DIAGNOSTIC FINDINGS: None that apply to POC  COGNITION: Overall cognitive status: Within functional limits for tasks assessed   SENSATION: Pt denies numbness/tingling   COORDINATION: Heel to shin test: WNL bilaterally   EDEMA: Chronic swelling in L ankle, does not take diuretic every day   POSTURE: rounded shoulders, forward head, and flexed trunk    LOWER EXTREMITY MMT:  Tested in seated position   MMT Right Eval Left Eval  Hip flexion 3+ 3+  Hip extension    Hip abduction 4 4  Hip adduction 3+ 3+  Hip internal rotation    Hip external rotation    Knee flexion 4 4  Knee extension 4+ 4+  Ankle dorsiflexion 4+ 4+  Ankle plantarflexion    Ankle inversion    Ankle eversion    (Blank rows = not tested)  TODAY'S TREATMENT:      Ther Ex  SciFit multi-peaks level 6 for 8 minutes using BUE/BLEs for neural priming for reciprocal movement, dynamic cardiovascular warmup and increased amplitude of stepping. Pt enjoyed activity, informed pt that these machines are present at the [facility].  Established HEP for improved global strength and facilitation of return to gym (see bolded below):  Seated OH tricep extension w/5# DB, 4x5 reps. Mod verbal cues for proper form and facilitation of upright  posture. "Stormy Fabian this is hard!"  Seated bicep curls w/5# dumbbells, x15 reps  Seated OH presses w/5# dumbbells, 2x8 reps. Min cues for upright posture throughout  Reviewed KB deadlifts using 10# KB, x15 reps. Min cues for neutral spine position and to avoid locking knees out. Pt reports movement feels better when she bends her knees and was able to self-correct form for remainder of reps  Seated march overs using 10# KB, x5 per side. Pt very challenged by this but performed well.   GAIT: Gait pattern: WFL Distance walked: Various clinic distances  Assistive device utilized: None Level of assistance: Complete Independence                                                                                                                           PATIENT EDUCATION: Education details: Initial HEP, joining [employer]  Person educated: Patient Education method: Explanation Education comprehension: verbalized understanding  HOME EXERCISE PROGRAM: Access Code: [email or web address] URL: [email or web address] Date: 07/15/2023 Prepared by: Alethia Berthold   Exercises - Seated Overhead Elbow Extension with Dumbbells with PLB  - 1 x daily - 7 x weekly - 3 sets - 10 reps - Seated Bicep Curls Supinated with Dumbbells  - 1 x daily -  7 x weekly - 3 sets - 10 reps - Seated Overhead Press with Dumbbells  - 1 x daily - 7 x weekly - 3 sets - 10 reps - Kettlebell Deadlift  - 1 x daily - 7 x weekly - 3 sets - 10 reps - Seated march over  - 1 x daily - 7 x weekly - 3 sets - 10 reps  GOALS: Goals reviewed with patient? Yes  SHORT TERM GOALS: Target date: 07/10/2023   Pt will be independent with initial HEP for improved strength, transfers and gait.  Baseline: not established on eval  Goal status: IN PROGRESS   2.  Pt will improve 5 x STS to less than or equal to 28 seconds without UE support to demonstrate improved functional strength and transfer efficiency.   Baseline: 32.5s w/hands braced on  knees Goal status: INITIAL  3.  to be assessed and LTG updated  Baseline:  Goal status: MET   LONG TERM GOALS: Target date: 07/24/2023   Pt will transition to community-based gym [facility]) to participate in group classes and perform gym-based HEP for maintained functional gains and endurance  Baseline: pt to join local [employer] on 7/3 Goal status: INITIAL  2.  Pt will ambulate greater than or equal to 1350 feet on for improved cardiovascular endurance and BLE strength.   Baseline: 1285' Goal status: REVISED   3.  Pt will improve 5 x STS to less than or equal to 25 seconds without UE support to demonstrate improved functional strength and transfer efficiency.   Baseline: 32.5s Goal status: INITIAL   ASSESSMENT:  CLINICAL IMPRESSION: Emphasis of skilled PT session on global strength and establishing initial HEP w/use of dumbbells. Pt tolerated session well but was challenged by core stability exercises and OH strength. Pt very motivated to improve her functional strength and purchased weights to use at home. Continue to encourage pt to join [facility] as this was her initial goal and pt will have more access to exercise equipment. Continue POC.    OBJECTIVE IMPAIRMENTS: decreased activity tolerance, decreased endurance, decreased mobility, difficulty walking, decreased strength, and obesity  ACTIVITY LIMITATIONS: carrying, bending, stairs, and locomotion level  PARTICIPATION LIMITATIONS: shopping, community activity, and yard work  PERSONAL FACTORS: Age, Past/current experiences, and 1 comorbidity: HTN  are also affecting patient's functional outcome.   REHAB POTENTIAL: Good  CLINICAL DECISION MAKING: Stable/uncomplicated  EVALUATION COMPLEXITY: Low  PLAN:  PT FREQUENCY: 1x/week  PT DURATION: 8 weeks (pt scheduled for 6 weeks, POC written for 8 due to potential scheduling delay)   PLANNED INTERVENTIONS: Therapeutic exercises, Therapeutic activity, Neuromuscular  re-education, Balance training, Gait training, Patient/Family education, Self Care, Joint mobilization, Stair training, Aquatic Therapy, Dry Needling, Manual therapy, and Re-evaluation  PLAN FOR NEXT SESSION: Did she join [employer]? Add to HEP for BLE strength, endurance. Work on floor transfers to imitate gardening. KB swings, functional strength    E , PT, DPT 07/15/2023, 11:04 AM

## 2023-07-17 ENCOUNTER — Other Ambulatory Visit: Payer: Self-pay | Admitting: Nurse Practitioner

## 2023-07-17 DIAGNOSIS — E559 Vitamin D deficiency, unspecified: Secondary | ICD-10-CM

## 2023-07-19 ENCOUNTER — Other Ambulatory Visit: Payer: Self-pay | Admitting: Bariatrics

## 2023-07-19 DIAGNOSIS — E559 Vitamin D deficiency, unspecified: Secondary | ICD-10-CM

## 2023-07-21 DIAGNOSIS — I10 Essential (primary) hypertension: Secondary | ICD-10-CM | POA: Diagnosis not present

## 2023-07-21 DIAGNOSIS — G4733 Obstructive sleep apnea (adult) (pediatric): Secondary | ICD-10-CM | POA: Diagnosis not present

## 2023-07-22 ENCOUNTER — Encounter: Payer: Self-pay | Admitting: Physical Therapy

## 2023-07-22 ENCOUNTER — Ambulatory Visit: Payer: Medicare PPO | Admitting: Physical Therapy

## 2023-07-22 VITALS — BP 158/83 | HR 59

## 2023-07-22 DIAGNOSIS — R2689 Other abnormalities of gait and mobility: Secondary | ICD-10-CM | POA: Diagnosis not present

## 2023-07-22 DIAGNOSIS — M6281 Muscle weakness (generalized): Secondary | ICD-10-CM | POA: Diagnosis not present

## 2023-07-22 NOTE — Therapy (Signed)
OUTPATIENT PHYSICAL THERAPY NEURO TREATMENT   Patient Name: Claudia Cantrell MRN: [id number] DOB:July 16, 1946, 77 y.o., female Today's Date: 07/22/2023   PCP: Avanell Shackleton, NP-C REFERRING PROVIDER: Irene Limbo, FNP  END OF SESSION:  PT End of Session - 07/22/23 1024     Visit Number 4    Number of Visits 7    Date for PT Re-Evaluation 08/07/23   Due to potential delay in scheduling   Authorization Type [employer]    PT Start Time 1018    PT Stop Time 1058    PT Time Calculation (min) 40 min    Equipment Utilized During Treatment Gait belt    Activity Tolerance Patient tolerated treatment well;Patient limited by fatigue    Behavior During Therapy WFL for tasks assessed/performed               Past Medical History:  Diagnosis Date   Arthritis    rheumatoid   Back pain    CAD (coronary artery disease)    a. 1992 s/p MI and PTCA of unknown vessel;  b. 09/2010 Cath: LM nl, LAD 20p, LCX 8m, RCA 46m, RPL 20.   Cancer [facility])    Cervical cancer (HCC) 1977   Chest pain    Chronic kidney disease    Constipation    Family history of anesthesia complication    daughter has difficulty waking    GERD (gastroesophageal reflux disease)    occ   Gout 10/08/2016   History of heart attack    Hyperlipidemia    Hypertensive heart disease    Hypothyroidism    Joint pain    Nonischemic cardiomyopathy (HCC)    a. 07/2016 Echo: EF 40-45%, mild LVH, inferior akinesis, moderately dilated left atrium, trivial AI and MR.   PAF (paroxysmal atrial fibrillation) (HCC)    a. 07/2016 Admitted w/ AF RVR-->CHA2DS2VASc = 5-->Eliquis;  b. 07/2016 successful TEE/DCCV.   Pre-diabetes    Sleep apnea    a. Using CPAP.   SOB (shortness of breath)    Vitamin D deficiency    Past Surgical History:  Procedure Laterality Date   ABDOMINAL HYSTERECTOMY  1988   ATRIAL FIBRILLATION ABLATION N/A 07/28/2021   Procedure: ATRIAL FIBRILLATION ABLATION;  Surgeon: Regan Lemming, MD;   Location: [facility];  Service: Cardiovascular;  Laterality: N/A;   ATRIAL FIBRILLATION ABLATION N/A 12/14/2022   Procedure: ATRIAL FIBRILLATION ABLATION;  Surgeon: Regan Lemming, MD;  Location: [facility];  Service: Cardiovascular;  Laterality: N/A;   BACK SURGERY  1994   BIOPSY  09/02/2019   Procedure: BIOPSY;  Surgeon: Meridee Score Netty Starring., MD;  Location: [facility];  Service: Gastroenterology;;   BIOPSY  12/09/2019   Procedure: BIOPSY;  Surgeon: Lemar Lofty., MD;  Location: [facility]COPY;  Service: Gastroenterology;;   BIOPSY  03/08/2021   Procedure: BIOPSY;  Surgeon: Lemar Lofty., MD;  Location: [facility];  Service: Gastroenterology;;   BIOPSY  07/08/2023   Procedure: BIOPSY;  Surgeon: Lemar Lofty., MD;  Location: [facility]OSCOPY;  Service: Gastroenterology;;   CARDIAC CATHETERIZATION  1991 AND 1992   PTCA BY DR Meryl Crutch   CARDIOVERSION N/A 08/01/2016   Procedure: CARDIOVERSION;  Surgeon: Lewayne Bunting, MD;  Location: [facility] ENDOSCOPY;  Service: Cardiovascular;  Laterality: N/A;   CARDIOVERSION N/A 01/11/2020   Procedure: CARDIOVERSION;  Surgeon: Wendall Stade, MD;  Location: [facility] ENDOSCOPY;  Service: Cardiovascular;  Laterality: N/A;   CARDIOVERSION N/A 07/08/2020   Procedure:  CARDIOVERSION;  Surgeon: Little Ishikawa, MD;  Location: [facility] ENDOSCOPY;  Service: Cardiovascular;  Laterality: N/A;   CARDIOVERSION N/A 05/31/2022   Procedure: CARDIOVERSION;  Surgeon: Chrystie Nose, MD;  Location: [facility] ENDOSCOPY;  Service: Cardiovascular;  Laterality: N/A;   CARDIOVERSION N/A 07/18/2022   Procedure: CARDIOVERSION;  Surgeon: Little Ishikawa, MD;  Location: [facility] ENDOSCOPY;  Service: Cardiovascular;  Laterality: N/A;   ENDOSCOPIC MUCOSAL RESECTION N/A 12/09/2019   Procedure: ENDOSCOPIC MUCOSAL RESECTION;  Surgeon: Meridee Score Netty Starring., MD;  Location: [facility] ENDOSCOPY;  Service: Gastroenterology;  Laterality: N/A;    ESOPHAGOGASTRODUODENOSCOPY N/A 03/08/2021   Procedure: ESOPHAGOGASTRODUODENOSCOPY (EGD);  Surgeon: Lemar Lofty., MD;  Location: [facility]OSCOPY;  Service: Gastroenterology;  Laterality: N/A;   ESOPHAGOGASTRODUODENOSCOPY (EGD) WITH PROPOFOL N/A 09/02/2019   Procedure: ESOPHAGOGASTRODUODENOSCOPY (EGD) WITH PROPOFOL;  Surgeon: Meridee Score Netty Starring., MD;  Location: [facility] ENDOSCOPY;  Service: Gastroenterology;  Laterality: N/A;   ESOPHAGOGASTRODUODENOSCOPY (EGD) WITH PROPOFOL N/A 12/09/2019   Procedure: ESOPHAGOGASTRODUODENOSCOPY (EGD) WITH PROPOFOL;  Surgeon: Meridee Score Netty Starring., MD;  Location: [facility] ENDOSCOPY;  Service: Gastroenterology;  Laterality: N/A;   ESOPHAGOGASTRODUODENOSCOPY (EGD) WITH PROPOFOL N/A 07/08/2023   Procedure: ESOPHAGOGASTRODUODENOSCOPY (EGD) WITH PROPOFOL;  Surgeon: Meridee Score Netty Starring., MD;  Location: [facility] ENDOSCOPY;  Service: Gastroenterology;  Laterality: N/A;   EUS N/A 09/02/2019   Procedure: UPPER ENDOSCOPIC ULTRASOUND (EUS) RADIAL;  Surgeon: Lemar Lofty., MD;  Location: [facility] ENDOSCOPY;  Service: Gastroenterology;  Laterality: N/A;  EUS radial/linear   EUS N/A 12/09/2019   Procedure: UPPER ENDOSCOPIC ULTRASOUND (EUS) RADIAL;  Surgeon: Lemar Lofty., MD;  Location: [facility] ENDOSCOPY;  Service: Gastroenterology;  Laterality: N/A;   EUS  12/09/2019   Procedure: UPPER ENDOSCOPIC ULTRASOUND (EUS) LINEAR;  Surgeon: Lemar Lofty., MD;  Location: [facility] ENDOSCOPY;  Service: Gastroenterology;;   EUS N/A 03/08/2021   Procedure: UPPER ENDOSCOPIC ULTRASOUND (EUS) RADIAL;  Surgeon: Lemar Lofty., MD;  Location: [facility] ENDOSCOPY;  Service: Gastroenterology;  Laterality: N/A;   FINE NEEDLE ASPIRATION  09/02/2019   Procedure: FINE NEEDLE ASPIRATION (FNA) LINEAR;  Surgeon: Lemar Lofty., MD;  Location: [facility] ENDOSCOPY;  Service: Gastroenterology;;   FINE NEEDLE ASPIRATION  12/09/2019   Procedure: FINE NEEDLE ASPIRATION (FNA) LINEAR;  Surgeon: Lemar Lofty., MD;  Location: [facility] ENDOSCOPY;  Service: Gastroenterology;;   HEMOSTASIS CLIP PLACEMENT  12/09/2019   Procedure: HEMOSTASIS CLIP PLACEMENT;  Surgeon: Lemar Lofty., MD;  Location: [facility] ENDOSCOPY;  Service: Gastroenterology;;   ORIF ANKLE FRACTURE Right 04/27/2015   Procedure: OPEN REDUCTION INTERNAL FIXATION (ORIF) RIGHT BIMALLEOLAR ANKLE FRACTURE WITH SYNDESMOSIS FIXATION;  Surgeon: Tarry Kos, MD;  Location: [facility] OR;  Service: Orthopedics;  Laterality: Right;   SUBMUCOSAL LIFTING INJECTION  12/09/2019   Procedure: SUBMUCOSAL LIFTING INJECTION;  Surgeon: Lemar Lofty., MD;  Location: [facility] ENDOSCOPY;  Service: Gastroenterology;;   TEE WITHOUT CARDIOVERSION N/A 08/01/2016   Procedure: TRANSESOPHAGEAL ECHOCARDIOGRAM (TEE);  Surgeon: Lewayne Bunting, MD;  Location: [facility] ENDOSCOPY;  Service: Cardiovascular;  Laterality: N/A;   THYROIDECTOMY  11/24/2013   DR Derrell Lolling   THYROIDECTOMY N/A 11/24/2013   Procedure: TOTAL THYROIDECTOMY;  Surgeon: Ernestene Mention, MD;  Location: [facility] OR;  Service: General;  Laterality: N/A;   TRANSTHORACIC ECHOCARDIOGRAM  01/15/2013   EF 55% TO 65%. PROBABLE MILD HYPOKINESIS OF THE INFERIOR MYOCARDIUM. GRADE 1 DIASTOLIC DYSFUNCTION. TRIAL AR.LA IS MILDLY DILATED.   UPPER ESOPHAGEAL ENDOSCOPIC ULTRASOUND (EUS) N/A 07/08/2023   Procedure: UPPER ESOPHAGEAL ENDOSCOPIC ULTRASOUND (EUS);  Surgeon: Meridee Score Netty Starring., MD;  Location: [facility]OSCOPY;  Service:  Gastroenterology;  Laterality: N/A;   Patient Active Problem List   Diagnosis Date Noted   Vitamin D insufficiency 03/25/2023   Insulin resistance 03/13/2023   Generalized obesity 03/11/2023   BMI 32.0-32.9,adult 03/11/2023   Health care maintenance 03/11/2023   SOB (shortness of breath) on exertion 03/11/2023   Other fatigue 03/11/2023   Postoperative hypothyroidism 06/28/2022   Hypercoagulable state due to persistent atrial fibrillation (HCC) 05/21/2022   Overactive bladder 05/13/2022   Mixed  hyperlipidemia 05/09/2022   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/2022   Acquired hypothyroidism 10/30/2021   Stromal tumor determined by gastric biopsy 06/24/2020   Iatrogenic hyperthyroidism 05/02/2020   Dyslipidemia, goal LDL below 70 05/02/2020   Acute combined systolic and diastolic CHF, NYHA class 3 (HCC) 09/23/2019   Persistent atrial fibrillation (HCC) 09/21/2019   Submucosal lesion of stomach 07/19/2019   Abnormal CT of the abdomen 07/19/2019   Abnormal CT scan, stomach 06/17/2019   Advance directive declined by patient 01/14/2019   Chronic anticoagulation 01/14/2019   CRI (chronic renal insufficiency), stage 2 (mild) 01/14/2019   Prediabetes 01/14/2019   Persistent proteinuria 02/17/2018   Hyperuricemia 03/26/2017   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   Vitamin D deficiency 03/26/2017   Medication monitoring encounter 03/26/2017   Gout 10/08/2016   Essential hypertension    CAD S/P percutaneous coronary angioplasty    Nonischemic cardiomyopathy (HCC)    Chest pain with moderate risk for cardiac etiology 08/02/2016   Closed right ankle fracture 04/24/2015   Ankle fracture, bimalleolar, closed 04/24/2015   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   OSA on CPAP 10/09/2013    ONSET DATE: 06/04/2023 (referral)  REFERRING DIAG: R26.89 (ICD-10-CM) - Balance problem R53.1 (ICD-10-CM) - Weakness  THERAPY DIAG:  Muscle weakness (generalized)  Other abnormalities of gait and mobility  Rationale for Evaluation and Treatment: Rehabilitation  SUBJECTIVE:                                                                                                                                                                                             SUBJECTIVE STATEMENT: Pt reports she feels "yucky" today and has no energy.  She had several instances of waking up last night feeling achy all over.  Her eyes are continuing to bother her as they  remain sensitive to light.  She denies headache or pain today.  She denies falls.  She has not joined the [facility] yet.  Pt accompanied by: self  PERTINENT HISTORY: Cardiac Ablation on 12/14/22, scheduled for cataract surgery on 06/26/23. Scheduled for GI endoscopy on 07/08/23  PAIN:  Are you having pain?  No  PRECAUTIONS: Fall  WEIGHT BEARING RESTRICTIONS: No  FALLS: Has patient fallen in last 6 months? No  LIVING ENVIRONMENT: Lives with: lives alone Lives in: House/apartment Stairs: Yes: External: 5-7 steps; bilateral but cannot reach both Has following equipment at home: Walker - 2 wheeled and shower chair  PLOF: Independent  PATIENT GOALS: "just to be able to strengthen my muscles"   VITALS  Vitals:   07/22/23 1024  BP: (!) 158/83  Pulse: (!) 59     OBJECTIVE:   DIAGNOSTIC FINDINGS: None that apply to POC  COGNITION: Overall cognitive status: Within functional limits for tasks assessed   SENSATION: Pt denies numbness/tingling   COORDINATION: Heel to shin test: WNL bilaterally   EDEMA: Chronic swelling in L ankle, does not take diuretic every day   POSTURE: rounded shoulders, forward head, and flexed trunk    LOWER EXTREMITY MMT:  Tested in seated position   MMT Right Eval Left Eval  Hip flexion 3+ 3+  Hip extension    Hip abduction 4 4  Hip adduction 3+ 3+  Hip internal rotation    Hip external rotation    Knee flexion 4 4  Knee extension 4+ 4+  Ankle dorsiflexion 4+ 4+  Ankle plantarflexion    Ankle inversion    Ankle eversion    (Blank rows = not tested)  TODAY'S TREATMENT:     -SciFit x8 minutes using BUE/BLE progressing to level 2.0 for endurance and reciprocal mobility.  Patient unable to provide RPE during task due to baseline fatigue today stating "I just feel tired, but I felt tired before PT." -Attempted kettlebell swings w/ 10lb kettlebell w/ return demo and multimodal cuing for sequencing and goal of task, pt unable to adequately obtain  form for exercise -Pallof march w/ 6lb weighted ball 3x40 seconds (2 rounds w/ short lever arm and 1 round w/ extended lever arm) -Standing D1/D2 w/ 6lb ball in large ROM, encouraged knee bend to put more emphasis in LE -STS w/ 6lb slam ball x10, cued for force production w/ slam vs dropping ball  PATIENT EDUCATION: Education details: Continued to encourage joining [employer].  Goal assessment next visit with possibility of re-cert vs discharge based on progress and other factors.  Reprinted HEP as pt misplaced prior copy and has not been able to do them - verbally reviewed HEP.  Encouraged pt to eat and take medicines when she goes home as she has not done either of these yet. Person educated: Patient Education method: Explanation Education comprehension: verbalized understanding  HOME EXERCISE PROGRAM: Access Code: [email or web address] URL: [email or web address] Date: 07/15/2023 Prepared by: Alethia Berthold Plaster  Exercises - Seated Overhead Elbow Extension with Dumbbells with PLB  - 1 x daily - 7 x weekly - 3 sets - 10 reps - Seated Bicep Curls Supinated with Dumbbells  - 1 x daily - 7 x weekly - 3 sets - 10 reps - Seated Overhead Press with Dumbbells  - 1 x daily - 7 x weekly - 3 sets - 10 reps - Kettlebell Deadlift  - 1 x daily - 7 x weekly - 3 sets - 10 reps - Seated march over  - 1 x daily - 7 x weekly - 3 sets - 10 reps  GOALS: Goals reviewed with patient? Yes  SHORT TERM GOALS: Target date: 07/10/2023   Pt will be independent with initial HEP for improved strength, transfers and gait.  Baseline: not established on eval  Goal status: IN PROGRESS   2.  Pt will improve 5 x STS to less than or equal to 28 seconds without UE support to demonstrate improved functional strength and transfer efficiency.   Baseline: 32.5s w/hands braced on knees Goal status: INITIAL  3.  to be assessed and LTG updated  Baseline:  Goal status: MET   LONG TERM GOALS: Target date: 07/24/2023   Pt  will transition to community-based gym [facility]) to participate in group classes and perform gym-based HEP for maintained functional gains and endurance  Baseline: pt to join local [employer] on 7/3 Goal status: INITIAL  2.  Pt will ambulate greater than or equal to 1350 feet on for improved cardiovascular endurance and BLE strength.   Baseline: 1285' Goal status: REVISED   3.  Pt will improve 5 x STS to less than or equal to 25 seconds without UE support to demonstrate improved functional strength and transfer efficiency.   Baseline: 32.5s Goal status: INITIAL   ASSESSMENT:  CLINICAL IMPRESSION: Skilled session today limited due to patient fatigue with patient not wanting to practice floor recovery or floor based tasks today.  She had not taken her medicines or eaten when she arrived, but PT was unaware of this until the end of her session.  Did therapeutic exercise today to build on endurance and functional strength and encouraged patient to join her local [employer] as prior therapist and her have discussed in order to meet personal goals.  She is due for LTG assessment next session with re-establishment of POC vs discharge.  Patient was prepared for possibility of either per discussion today.  OBJECTIVE IMPAIRMENTS: decreased activity tolerance, decreased endurance, decreased mobility, difficulty walking, decreased strength, and obesity  ACTIVITY LIMITATIONS: carrying, bending, stairs, and locomotion level  PARTICIPATION LIMITATIONS: shopping, community activity, and yard work  PERSONAL FACTORS: Age, Past/current experiences, and 1 comorbidity: HTN  are also affecting patient's functional outcome.   REHAB POTENTIAL: Good  CLINICAL DECISION MAKING: Stable/uncomplicated  EVALUATION COMPLEXITY: Low  PLAN:  PT FREQUENCY: 1x/week  PT DURATION: 8 weeks (pt scheduled for 6 weeks, POC written for 8 due to potential scheduling delay)   PLANNED INTERVENTIONS: Therapeutic exercises,  Therapeutic activity, Neuromuscular re-education, Balance training, Gait training, Patient/Family education, Self Care, Joint mobilization, Stair training, Aquatic Therapy, Dry Needling, Manual therapy, and Re-evaluation  PLAN FOR NEXT SESSION: Did she join [employer]? Add to HEP for BLE strength, endurance. Work on floor transfers to imitate gardening. KB swings, functional strength   Sadie Haber, PT, DPT 07/22/2023, 6:31 PM

## 2023-07-23 ENCOUNTER — Ambulatory Visit: Payer: Medicare PPO | Admitting: Nurse Practitioner

## 2023-07-29 ENCOUNTER — Ambulatory Visit: Payer: Medicare PPO | Admitting: Physical Therapy

## 2023-07-29 DIAGNOSIS — M6281 Muscle weakness (generalized): Secondary | ICD-10-CM | POA: Diagnosis not present

## 2023-07-29 DIAGNOSIS — R2689 Other abnormalities of gait and mobility: Secondary | ICD-10-CM

## 2023-07-29 NOTE — Therapy (Addendum)
OUTPATIENT PHYSICAL THERAPY NEURO TREATMENT- DISCHARGE SUMMARY   Patient Name: Claudia Cantrell MRN: [id number] DOB:[birth year], 77 y.o., female Today's Date: 07/29/2023   PCP: Avanell Shackleton, NP-C REFERRING PROVIDER: Irene Limbo, FNP  PHYSICAL THERAPY DISCHARGE SUMMARY  Visits from Start of Care: 5  Current functional level related to goals / functional outcomes: Independent w/all ADLS    Remaining deficits: Decreased functional strength/endurance, low fall risk    Education / Equipment: HEP, plan to join [employer]   Patient agrees to discharge. Patient goals were met. Patient is being discharged due to being pleased with the current functional level.   END OF SESSION:  PT End of Session - 07/29/23 1026     Visit Number 5    Number of Visits 7    Date for PT Re-Evaluation 08/07/23   Due to potential delay in scheduling   Authorization Type [employer]    PT Start Time 1024   Pt arrived late   PT Stop Time 1055   DC   PT Time Calculation (min) 31 min    Equipment Utilized During Treatment Gait belt    Activity Tolerance Patient tolerated treatment well    Behavior During Therapy WFL for tasks assessed/performed               Past Medical History:  Diagnosis Date   Arthritis    rheumatoid   Back pain    CAD (coronary artery disease)    a. 1992 s/p MI and PTCA of unknown vessel;  b. 09/2010 Cath: LM nl, LAD 20p, LCX 45m, RCA 84m, RPL 20.   Cancer [facility])    Cervical cancer (HCC) 1977   Chest pain    Chronic kidney disease    Constipation    Family history of anesthesia complication    daughter has difficulty waking    GERD (gastroesophageal reflux disease)    occ   Gout 10/08/2016   History of heart attack    Hyperlipidemia    Hypertensive heart disease    Hypothyroidism    Joint pain    Nonischemic cardiomyopathy (HCC)    a. 07/2016 Echo: EF 40-45%, mild LVH, inferior akinesis, moderately dilated left atrium, trivial AI and MR.   PAF  (paroxysmal atrial fibrillation) (HCC)    a. 07/2016 Admitted w/ AF RVR-->CHA2DS2VASc = 5-->Eliquis;  b. 07/2016 successful TEE/DCCV.   Pre-diabetes    Sleep apnea    a. Using CPAP.   SOB (shortness of breath)    Vitamin D deficiency    Past Surgical History:  Procedure Laterality Date   ABDOMINAL HYSTERECTOMY  1988   ATRIAL FIBRILLATION ABLATION N/A 07/28/2021   Procedure: ATRIAL FIBRILLATION ABLATION;  Surgeon: Regan Lemming, MD;  Location: [facility];  Service: Cardiovascular;  Laterality: N/A;   ATRIAL FIBRILLATION ABLATION N/A 12/14/2022   Procedure: ATRIAL FIBRILLATION ABLATION;  Surgeon: Regan Lemming, MD;  Location: [facility];  Service: Cardiovascular;  Laterality: N/A;   BACK SURGERY  1994   BIOPSY  09/02/2019   Procedure: BIOPSY;  Surgeon: Meridee Score Netty Starring., MD;  Location: [facility]OSCOPY;  Service: Gastroenterology;;   BIOPSY  12/09/2019   Procedure: BIOPSY;  Surgeon: Lemar Lofty., MD;  Location: [facility]OSCOPY;  Service: Gastroenterology;;   BIOPSY  03/08/2021   Procedure: BIOPSY;  Surgeon: Lemar Lofty., MD;  Location: [facility];  Service: Gastroenterology;;   BIOPSY  07/08/2023   Procedure: BIOPSY;  Surgeon: Lemar Lofty., MD;  Location: [facility] ENDOSCOPY;  Service: Gastroenterology;;   CARDIAC CATHETERIZATION  1991 AND 1992   PTCA BY DR Meryl Crutch   CARDIOVERSION N/A 08/01/2016   Procedure: CARDIOVERSION;  Surgeon: Lewayne Bunting, MD;  Location: [facility] ENDOSCOPY;  Service: Cardiovascular;  Laterality: N/A;   CARDIOVERSION N/A 01/11/2020   Procedure: CARDIOVERSION;  Surgeon: Wendall Stade, MD;  Location: [facility] ENDOSCOPY;  Service: Cardiovascular;  Laterality: N/A;   CARDIOVERSION N/A 07/08/2020   Procedure: CARDIOVERSION;  Surgeon: Little Ishikawa, MD;  Location: [facility] ENDOSCOPY;  Service: Cardiovascular;  Laterality: N/A;   CARDIOVERSION N/A 05/31/2022   Procedure: CARDIOVERSION;  Surgeon: Chrystie Nose, MD;   Location: [facility] ENDOSCOPY;  Service: Cardiovascular;  Laterality: N/A;   CARDIOVERSION N/A 07/18/2022   Procedure: CARDIOVERSION;  Surgeon: Little Ishikawa, MD;  Location: [facility] ENDOSCOPY;  Service: Cardiovascular;  Laterality: N/A;   ENDOSCOPIC MUCOSAL RESECTION N/A 12/09/2019   Procedure: ENDOSCOPIC MUCOSAL RESECTION;  Surgeon: Lemar Lofty., MD;  Location: [facility] ENDOSCOPY;  Service: Gastroenterology;  Laterality: N/A;   ESOPHAGOGASTRODUODENOSCOPY N/A 03/08/2021   Procedure: ESOPHAGOGASTRODUODENOSCOPY (EGD);  Surgeon: Lemar Lofty., MD;  Location: [facility] ENDOSCOPY;  Service: Gastroenterology;  Laterality: N/A;   ESOPHAGOGASTRODUODENOSCOPY (EGD) WITH PROPOFOL N/A 09/02/2019   Procedure: ESOPHAGOGASTRODUODENOSCOPY (EGD) WITH PROPOFOL;  Surgeon: Meridee Score Netty Starring., MD;  Location: [facility] ENDOSCOPY;  Service: Gastroenterology;  Laterality: N/A;   ESOPHAGOGASTRODUODENOSCOPY (EGD) WITH PROPOFOL N/A 12/09/2019   Procedure: ESOPHAGOGASTRODUODENOSCOPY (EGD) WITH PROPOFOL;  Surgeon: Meridee Score Netty Starring., MD;  Location: [facility] ENDOSCOPY;  Service: Gastroenterology;  Laterality: N/A;   ESOPHAGOGASTRODUODENOSCOPY (EGD) WITH PROPOFOL N/A 07/08/2023   Procedure: ESOPHAGOGASTRODUODENOSCOPY (EGD) WITH PROPOFOL;  Surgeon: Meridee Score Netty Starring., MD;  Location: [facility] ENDOSCOPY;  Service: Gastroenterology;  Laterality: N/A;   EUS N/A 09/02/2019   Procedure: UPPER ENDOSCOPIC ULTRASOUND (EUS) RADIAL;  Surgeon: Lemar Lofty., MD;  Location: [facility] ENDOSCOPY;  Service: Gastroenterology;  Laterality: N/A;  EUS radial/linear   EUS N/A 12/09/2019   Procedure: UPPER ENDOSCOPIC ULTRASOUND (EUS) RADIAL;  Surgeon: Lemar Lofty., MD;  Location: [facility] ENDOSCOPY;  Service: Gastroenterology;  Laterality: N/A;   EUS  12/09/2019   Procedure: UPPER ENDOSCOPIC ULTRASOUND (EUS) LINEAR;  Surgeon: Lemar Lofty., MD;  Location: [facility] ENDOSCOPY;  Service: Gastroenterology;;   EUS N/A 03/08/2021   Procedure: UPPER  ENDOSCOPIC ULTRASOUND (EUS) RADIAL;  Surgeon: Lemar Lofty., MD;  Location: [facility] ENDOSCOPY;  Service: Gastroenterology;  Laterality: N/A;   FINE NEEDLE ASPIRATION  09/02/2019   Procedure: FINE NEEDLE ASPIRATION (FNA) LINEAR;  Surgeon: Lemar Lofty., MD;  Location: [facility] ENDOSCOPY;  Service: Gastroenterology;;   FINE NEEDLE ASPIRATION  12/09/2019   Procedure: FINE NEEDLE ASPIRATION (FNA) LINEAR;  Surgeon: Lemar Lofty., MD;  Location: [facility] ENDOSCOPY;  Service: Gastroenterology;;   HEMOSTASIS CLIP PLACEMENT  12/09/2019   Procedure: HEMOSTASIS CLIP PLACEMENT;  Surgeon: Lemar Lofty., MD;  Location: [facility] ENDOSCOPY;  Service: Gastroenterology;;   ORIF ANKLE FRACTURE Right 04/27/2015   Procedure: OPEN REDUCTION INTERNAL FIXATION (ORIF) RIGHT BIMALLEOLAR ANKLE FRACTURE WITH SYNDESMOSIS FIXATION;  Surgeon: Tarry Kos, MD;  Location: [facility] OR;  Service: Orthopedics;  Laterality: Right;   SUBMUCOSAL LIFTING INJECTION  12/09/2019   Procedure: SUBMUCOSAL LIFTING INJECTION;  Surgeon: Lemar Lofty., MD;  Location: [facility] ENDOSCOPY;  Service: Gastroenterology;;   TEE WITHOUT CARDIOVERSION N/A 08/01/2016   Procedure: TRANSESOPHAGEAL ECHOCARDIOGRAM (TEE);  Surgeon: Lewayne Bunting, MD;  Location: [facility] ENDOSCOPY;  Service: Cardiovascular;  Laterality: N/A;   THYROIDECTOMY  11/24/2013   DR Derrell Lolling   THYROIDECTOMY N/A 11/24/2013   Procedure:  TOTAL THYROIDECTOMY;  Surgeon: Ernestene Mention, MD;  Location: [facility] OR;  Service: General;  Laterality: N/A;   TRANSTHORACIC ECHOCARDIOGRAM  01/15/2013   EF 55% TO 65%. PROBABLE MILD HYPOKINESIS OF THE INFERIOR MYOCARDIUM. GRADE 1 DIASTOLIC DYSFUNCTION. TRIAL AR.LA IS MILDLY DILATED.   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   Generalized obesity 03/11/2023   BMI 32.0-32.9,adult 03/11/2023   Health care maintenance 03/11/2023   SOB (shortness of breath) on exertion 03/11/2023   Other fatigue 03/11/2023   Postoperative hypothyroidism 06/28/2022   Hypercoagulable state due to persistent atrial fibrillation (HCC) 05/21/2022   Overactive bladder 05/13/2022   Mixed hyperlipidemia 05/09/2022   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/2022   Acquired hypothyroidism 10/30/2021   Stromal tumor determined by gastric biopsy 06/24/2020   Iatrogenic hyperthyroidism 05/02/2020   Dyslipidemia, goal LDL below 70 05/02/2020   Acute combined systolic and diastolic CHF, NYHA class 3 (HCC) 09/23/2019   Persistent atrial fibrillation (HCC) 09/21/2019   Submucosal lesion of stomach 07/19/2019   Abnormal CT of the abdomen 07/19/2019   Abnormal CT scan, stomach 06/17/2019   Advance directive declined by patient 01/14/2019   Chronic anticoagulation 01/14/2019   CRI (chronic renal insufficiency), stage 2 (mild) 01/14/2019   Prediabetes 01/14/2019   Persistent proteinuria 02/17/2018   Hyperuricemia 03/26/2017   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   Vitamin D deficiency 03/26/2017   Medication monitoring encounter 03/26/2017   Gout 10/08/2016   Essential hypertension    CAD S/P percutaneous coronary angioplasty    Nonischemic cardiomyopathy (HCC)    Chest pain with moderate risk for cardiac etiology 08/02/2016   Closed right ankle fracture 04/24/2015   Ankle fracture, bimalleolar, closed 04/24/2015   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 Has been working on her walking, has increased  her walking to 30 minutes per day as she is able. Still having difficulty with her vision. Went to the [facility] to join, but was told she needs to update her [employer] profile. Pt unable to do that currently due to impaired vision, but plans on asking her daughter to assist her. Denies falls.   Pt accompanied by: self  PERTINENT HISTORY: Cardiac Ablation on 12/14/22, scheduled for cataract surgery on 06/26/23. Scheduled for GI endoscopy on 07/08/23  PAIN:  Are you having pain? No  PRECAUTIONS: Fall  WEIGHT BEARING RESTRICTIONS: No  FALLS: Has patient fallen in last 6 months? No  LIVING ENVIRONMENT: Lives with: lives alone Lives in: House/apartment Stairs: Yes: External: 5-7 steps; bilateral but cannot reach both Has following equipment at home: Walker - 2 wheeled and shower chair  PLOF: Independent  PATIENT GOALS: "just to be able to strengthen my muscles"   VITALS  There were no vitals filed for this visit.  OBJECTIVE:   DIAGNOSTIC FINDINGS: None that apply to POC  COGNITION: Overall cognitive status: Within functional limits for tasks assessed   SENSATION: Pt denies numbness/tingling   COORDINATION: Heel to shin test: WNL bilaterally   EDEMA: Chronic swelling in L ankle, does not take diuretic every day   POSTURE: rounded shoulders, forward head, and flexed trunk    LOWER EXTREMITY MMT:  Tested in seated position   MMT Right Eval Left Eval  Hip flexion 3+ 3+  Hip extension    Hip abduction 4 4  Hip adduction 3+ 3+  Hip internal rotation    Hip external rotation    Knee flexion 4 4  Knee extension 4+ 4+  Ankle  dorsiflexion 4+ 4+  Ankle plantarflexion    Ankle inversion    Ankle eversion    (Blank rows = not tested)  TODAY'S TREATMENT:     Ther Act  LTG Assessment   [facility] PT Assessment - 07/29/23 1037       Transfers   Five time sit to stand comments  22.5s   No UE support            Gait pattern: [facility] Distance walked: 115' loop completed 11x + 61' = 1326'  Assistive device utilized: None Level of assistance: Complete Independence Comments: No instability noted. "Jeepers"   Ther Ex  Added to HEP (see bolded below) for additional tricep and bicep work per pt request. Demonstrated in clinic well and pt able to teach back.    PATIENT EDUCATION: Education details: Goal outcomes, how to obtain new PT referral in future if balance/mobility declines, additions to HEP Person educated: Patient Education method: Explanation, Demonstration, and Handouts Education comprehension: verbalized understanding and returned demonstration  HOME EXERCISE PROGRAM: Access Code: [email or web address] URL: [email or web address] Date: 07/15/2023 Prepared by: Alethia Berthold Amayra Kiedrowski  Exercises - Seated Overhead Elbow Extension with Dumbbells with PLB  - 1 x daily - 7 x weekly - 3 sets - 10 reps - Seated Bicep Curls Supinated with Dumbbells  - 1 x daily - 7 x weekly - 3 sets - 10 reps - Seated Overhead Press with Dumbbells  - 1 x daily - 7 x weekly - 3 sets - 10 reps - Kettlebell Deadlift  - 1 x daily - 7 x weekly - 3 sets - 10 reps - Seated march over  - 1 x daily - 7 x weekly - 3 sets - 10 reps - Seated Triceps Extension  - 1 x daily - 7 x weekly - 3 sets -  10 reps - Standing Bicep Curls Neutral with Dumbbells  - 1 x daily - 7 x weekly - 3 sets - 10 reps  GOALS: Goals reviewed with patient? Yes  SHORT TERM GOALS: Target date: 07/10/2023   Pt will be independent with initial HEP for improved strength, transfers and gait.  Baseline: not established on eval  Goal status: IN PROGRESS   2.  Pt will  improve 5 x STS to less than or equal to 28 seconds without UE support to demonstrate improved functional strength and transfer efficiency.   Baseline: 32.5s w/hands braced on knees Goal status: INITIAL  3.  to be assessed and LTG updated  Baseline:  Goal status: MET   LONG TERM GOALS: Target date: 07/24/2023   Pt will transition to community-based gym [facility]) to participate in group classes and perform gym-based HEP for maintained functional gains and endurance  Baseline: pt to join local [employer] on 7/3; in process of obtaining silver sneakers on 8/19  Goal status: MET  2.  Pt will ambulate greater than or equal to 1350 feet on for improved cardiovascular endurance and BLE strength.   Baseline: 1285'; 1326' (8/19)  Goal status: PARTIALLY MET    3.  Pt will improve 5 x STS to less than or equal to 25 seconds without UE support to demonstrate improved functional strength and transfer efficiency.   Baseline: 32.5s; 22.5s without UE support  Goal status: MET   ASSESSMENT:  CLINICAL IMPRESSION: Emphasis of skilled PT session on LTG assessment and DC from PT. Pt has met 2 of 3 LTGs and partially met 1 of 3. Pt has improved her time on 5x STS without UE support and has started process of obtaining membership at the [facility]. Pt did improve her distance on but missed her goal by ~25', so gave pt partial credit. Pt able to teach back each of her exercises on her HEP this date and requested a few more exercises be added to work on BUE strength. Pt has made good gains in PT and is ready to transition to working out at home and joining [employer]. Pt verbalized agreement and readiness to DC this date.   OBJECTIVE IMPAIRMENTS: decreased activity tolerance, decreased endurance, decreased mobility, difficulty walking, decreased strength, and obesity  ACTIVITY LIMITATIONS: carrying, bending, stairs, and locomotion level  PARTICIPATION LIMITATIONS: shopping, community activity, and yard  work  PERSONAL FACTORS: Age, Past/current experiences, and 1 comorbidity: HTN  are also affecting patient's functional outcome.   REHAB POTENTIAL: Good  CLINICAL DECISION MAKING: Stable/uncomplicated  EVALUATION COMPLEXITY: Low  PLAN:  PT FREQUENCY: 1x/week  PT DURATION: 8 weeks (pt scheduled for 6 weeks, POC written for 8 due to potential scheduling delay)   PLANNED INTERVENTIONS: Therapeutic exercises, Therapeutic activity, Neuromuscular re-education, Balance training, Gait training, Patient/Family education, Self Care, Joint mobilization, Stair training, Aquatic Therapy, Dry Needling, Manual therapy, and Re-evaluation   Lindell Renfrew E Shelma Eiben, PT, DPT 07/29/2023, 10:59 AM

## 2023-08-14 ENCOUNTER — Other Ambulatory Visit: Payer: Self-pay | Admitting: Bariatrics

## 2023-08-14 DIAGNOSIS — E559 Vitamin D deficiency, unspecified: Secondary | ICD-10-CM

## 2023-08-21 DIAGNOSIS — G4733 Obstructive sleep apnea (adult) (pediatric): Secondary | ICD-10-CM | POA: Diagnosis not present

## 2023-08-21 DIAGNOSIS — I10 Essential (primary) hypertension: Secondary | ICD-10-CM | POA: Diagnosis not present

## 2023-08-30 DIAGNOSIS — G4733 Obstructive sleep apnea (adult) (pediatric): Secondary | ICD-10-CM | POA: Diagnosis not present

## 2023-08-31 ENCOUNTER — Ambulatory Visit
Admission: EM | Admit: 2023-08-31 | Discharge: 2023-08-31 | Disposition: A | Discharge status: Home / Self Care | Payer: Medicare PPO | Attending: Internal Medicine | Admitting: Internal Medicine

## 2023-08-31 DIAGNOSIS — Z87891 Personal history of nicotine dependence: Secondary | ICD-10-CM | POA: Diagnosis not present

## 2023-08-31 DIAGNOSIS — B349 Viral infection, unspecified: Secondary | ICD-10-CM | POA: Insufficient documentation

## 2023-08-31 DIAGNOSIS — Z1152 Encounter for screening for COVID-19: Secondary | ICD-10-CM | POA: Diagnosis not present

## 2023-08-31 DIAGNOSIS — R52 Pain, unspecified: Secondary | ICD-10-CM | POA: Insufficient documentation

## 2023-08-31 LAB — POCT INFLUENZA A/B
Influenza A, POC: NEGATIVE
Influenza B, POC: NEGATIVE

## 2023-08-31 NOTE — Discharge Instructions (Signed)
The clinic will contact you with results of the COVID test done today if positive.  Continue Tylenol over-the-counter as needed.  Lots of rest and fluids.  Please follow-up with your PCP in 2 days for recheck.  Please go to the ER if you develop any worsening symptoms.  I hope you feel better soon!

## 2023-08-31 NOTE — ED Provider Notes (Signed)
[facility]    CSN: [id number] Arrival date & time: 08/31/23  1537      History   Chief Complaint Chief Complaint  Patient presents with   Chills    HPI Claudia Cantrell is a 77 y.o. female  presents for evaluation of URI symptoms for 1 days. Patient reports associated symptoms of bodyaches, chills, fatigue. Denies N/V/D, fevers, cough, congestion, sore throat, shortness of breath. Patient does not have a hx of asthma. Patient does have a history of smoking but quit years ago.  Reports no sick contacts.  Pt has taken Tylenol OTC for symptoms. Pt has no other concerns at this time.   HPI  Past Medical History:  Diagnosis Date   Arthritis    rheumatoid   Back pain    CAD (coronary artery disease)    a. 1992 s/p MI and PTCA of unknown vessel;  b. 09/2010 Cath: LM nl, LAD 20p, LCX 80m, RCA 27m, RPL 20.   Cancer [facility])    Cervical cancer (HCC) 1977   Chest pain    Chronic kidney disease    Constipation    Family history of anesthesia complication    daughter has difficulty waking    GERD (gastroesophageal reflux disease)    occ   Gout 10/08/2016   History of heart attack    Hyperlipidemia    Hypertensive heart disease    Hypothyroidism    Joint pain    Nonischemic cardiomyopathy (HCC)    a. 07/2016 Echo: EF 40-45%, mild LVH, inferior akinesis, moderately dilated left atrium, trivial AI and MR.   PAF (paroxysmal atrial fibrillation) (HCC)    a. 07/2016 Admitted w/ AF RVR-->CHA2DS2VASc = 5-->Eliquis;  b. 07/2016 successful TEE/DCCV.   Pre-diabetes    Sleep apnea    a. Using CPAP.   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/2022   Acquired hypothyroidism 10/30/2021   Stromal tumor determined by gastric biopsy 06/24/2020   Iatrogenic hyperthyroidism 05/02/2020   Dyslipidemia, goal LDL below 70 05/02/2020   Acute combined systolic and diastolic CHF, NYHA class 3 (HCC) 09/23/2019   Persistent atrial fibrillation (HCC) 09/21/2019   Submucosal lesion of stomach 07/19/2019   Abnormal CT of the abdomen 07/19/2019   Abnormal CT scan, stomach 06/17/2019   Advance directive declined by patient 01/14/2019   Chronic anticoagulation 01/14/2019   CRI (chronic renal insufficiency), stage 2 (mild) 01/14/2019   Prediabetes 01/14/2019   Persistent proteinuria 02/17/2018   Hyperuricemia 03/26/2017   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   Vitamin D deficiency 03/26/2017   Medication monitoring encounter 03/26/2017   Gout 10/08/2016   Essential hypertension    CAD S/P percutaneous coronary angioplasty    Nonischemic cardiomyopathy (HCC)    Chest pain with moderate risk for cardiac etiology 08/02/2016   Closed right ankle fracture 04/24/2015   Ankle fracture, bimalleolar, closed 04/24/2015   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   OSA on CPAP 10/09/2013  Past Surgical History:  Procedure Laterality Date   ABDOMINAL HYSTERECTOMY  1988   ATRIAL FIBRILLATION ABLATION N/A 07/28/2021   Procedure: ATRIAL FIBRILLATION ABLATION;  Surgeon: Regan Lemming, MD;  Location: [facility];  Service: Cardiovascular;  Laterality: N/A;   ATRIAL FIBRILLATION ABLATION N/A 12/14/2022   Procedure: ATRIAL FIBRILLATION ABLATION;  Surgeon: Regan Lemming, MD;  Location: [facility];  Service: Cardiovascular;  Laterality: N/A;   BACK SURGERY  1994   BIOPSY   09/02/2019   Procedure: BIOPSY;  Surgeon: Meridee Score Netty Starring., MD;  Location: [facility];  Service: Gastroenterology;;   BIOPSY  12/09/2019   Procedure: BIOPSY;  Surgeon: Lemar Lofty., MD;  Location: [facility];  Service: Gastroenterology;;   BIOPSY  03/08/2021   Procedure: BIOPSY;  Surgeon: Lemar Lofty., MD;  Location: [facility];  Service: Gastroenterology;;   BIOPSY  07/08/2023   Procedure: BIOPSY;  Surgeon: Lemar Lofty., MD;  Location: [facility];  Service: Gastroenterology;;   CARDIAC CATHETERIZATION  1991 AND 1992   PTCA BY DR Meryl Crutch   CARDIOVERSION N/A 08/01/2016   Procedure: CARDIOVERSION;  Surgeon: Lewayne Bunting, MD;  Location: [facility] ENDOSCOPY;  Service: Cardiovascular;  Laterality: N/A;   CARDIOVERSION N/A 01/11/2020   Procedure: CARDIOVERSION;  Surgeon: Wendall Stade, MD;  Location: [facility] ENDOSCOPY;  Service: Cardiovascular;  Laterality: N/A;   CARDIOVERSION N/A 07/08/2020   Procedure: CARDIOVERSION;  Surgeon: Little Ishikawa, MD;  Location: [facility] ENDOSCOPY;  Service: Cardiovascular;  Laterality: N/A;   CARDIOVERSION N/A 05/31/2022   Procedure: CARDIOVERSION;  Surgeon: Chrystie Nose, MD;  Location: [facility] ENDOSCOPY;  Service: Cardiovascular;  Laterality: N/A;   CARDIOVERSION N/A 07/18/2022   Procedure: CARDIOVERSION;  Surgeon: Little Ishikawa, MD;  Location: [facility] ENDOSCOPY;  Service: Cardiovascular;  Laterality: N/A;   ENDOSCOPIC MUCOSAL RESECTION N/A 12/09/2019   Procedure: ENDOSCOPIC MUCOSAL RESECTION;  Surgeon: Lemar Lofty., MD;  Location: [facility] ENDOSCOPY;  Service: Gastroenterology;  Laterality: N/A;   ESOPHAGOGASTRODUODENOSCOPY N/A 03/08/2021   Procedure: ESOPHAGOGASTRODUODENOSCOPY (EGD);  Surgeon: Lemar Lofty., MD;  Location: [facility];  Service: Gastroenterology;  Laterality: N/A;   ESOPHAGOGASTRODUODENOSCOPY (EGD) WITH PROPOFOL N/A 09/02/2019   Procedure: ESOPHAGOGASTRODUODENOSCOPY (EGD) WITH  PROPOFOL;  Surgeon: Meridee Score Netty Starring., MD;  Location: [facility] ENDOSCOPY;  Service: Gastroenterology;  Laterality: N/A;   ESOPHAGOGASTRODUODENOSCOPY (EGD) WITH PROPOFOL N/A 12/09/2019   Procedure: ESOPHAGOGASTRODUODENOSCOPY (EGD) WITH PROPOFOL;  Surgeon: Meridee Score Netty Starring., MD;  Location: [facility];  Service: Gastroenterology;  Laterality: N/A;   ESOPHAGOGASTRODUODENOSCOPY (EGD) WITH PROPOFOL N/A 07/08/2023   Procedure: ESOPHAGOGASTRODUODENOSCOPY (EGD) WITH PROPOFOL;  Surgeon: Meridee Score Netty Starring., MD;  Location: [facility] ENDOSCOPY;  Service: Gastroenterology;  Laterality: N/A;   EUS N/A 09/02/2019   Procedure: UPPER ENDOSCOPIC ULTRASOUND (EUS) RADIAL;  Surgeon: Lemar Lofty., MD;  Location: [facility] END[facility]COPY;  Service: Gastroenterology;  Laterality: N/A;  EUS radial/linear   EUS N/A 12/09/2019   Procedure: UPPER ENDOSCOPIC ULTRASOUND (EUS) RADIAL;  Surgeon: Lemar Lofty., MD;  Location: [facility];  Service: Gastroenterology;  Laterality: N/A;   EUS  12/09/2019   Procedure: UPPER ENDOSCOPIC ULTRASOUND (EUS) LINEAR;  Surgeon: Lemar Lofty., MD;  Location: [facility];  Service: Gastroenterology;;   EUS N/A 03/08/2021   Procedure: UPPER ENDOSCOPIC ULTRASOUND (EUS) RADIAL;  Surgeon: Lemar Lofty., MD;  Location: [facility];  Service: Gastroenterology;  Laterality: N/A;   FINE NEEDLE ASPIRATION  09/02/2019   Procedure: FINE NEEDLE ASPIRATION (FNA) LINEAR;  Surgeon: Lemar Lofty., MD;  Location: [facility] END[facility];  Service: Gastroenterology;;   FINE NEEDLE ASPIRATION  12/09/2019   Procedure: FINE NEEDLE ASPIRATION (FNA) LINEAR;  Surgeon: Lemar Lofty., MD;  Location: [facility];  Service: Gastroenterology;;   HEMOSTASIS CLIP PLACEMENT  12/09/2019   Procedure: HEMOSTASIS CLIP PLACEMENT;  Surgeon: Lemar Lofty., MD;  Location: [facility];  Service: Gastroenterology;;   ORIF ANKLE FRACTURE Right 04/27/2015   Procedure: OPEN REDUCTION  INTERNAL FIXATION (ORIF) RIGHT BIMALLEOLAR ANKLE FRACTURE WITH SYNDESMOSIS FIXATION;  Surgeon: Tarry Kos, MD;  Location: [facility] OR;  Service: Orthopedics;  Laterality: Right;   SUBMUCOSAL LIFTING INJECTION  12/09/2019   Procedure: SUBMUCOSAL LIFTING INJECTION;  Surgeon: Lemar Lofty., MD;  Location: [facility] END[facility];  Service: Gastroenterology;;   TEE WITHOUT CARDIOVERSION N/A 08/01/2016   Procedure: TRANSESOPHAGEAL ECHOCARDIOGRAM (TEE);  Surgeon: Lewayne Bunting, MD;  Location: [facility] ENDOSCOPY;  Service: Cardiovascular;  Laterality: N/A;   THYROIDECTOMY  11/24/2013   DR Derrell Lolling   THYROIDECTOMY N/A 11/24/2013   Procedure: TOTAL THYROIDECTOMY;  Surgeon: Ernestene Mention, MD;  Location: [facility] OR;  Service: General;  Laterality: N/A;   TRANSTHORACIC ECHOCARDIOGRAM  01/15/2013   EF 55% TO 65%. PROBABLE MILD HYPOKINESIS OF THE INFERIOR MYOCARDIUM. GRADE 1 DIASTOLIC DYSFUNCTION. TRIAL AR.LA IS MILDLY DILATED.   UPPER ESOPHAGEAL ENDOSCOPIC ULTRASOUND (EUS) N/A 07/08/2023   Procedure: UPPER ESOPHAGEAL ENDOSCOPIC ULTRASOUND (EUS);  Surgeon: Lemar Lofty., MD;  Location: [facility];  Service: Gastroenterology;  Laterality: N/A;    OB History     Gravida  2   Para      Term      Preterm      AB      Living         SAB      IAB      Ectopic      Multiple      Live Births               Home Medications    Prior to Admission medications   Medication Sig Start Date End Date Taking? Authorizing Provider  acetaminophen (TYLENOL) 500 MG tablet Take 1,000 mg by mouth every 6 (six) hours as needed for moderate pain.    [provider]  allopurinol (ZYLOPRIM) 100 MG tablet Take 1 tablet (100 mg total) by mouth daily. 09/17/22   Gearldine Bienenstock, PA-C  amLODipine (NORVASC) 5 MG tablet TAKE 1 & 1/2 (ONE & ONE-HALF) TABLETS BY MOUTH IN THE EVENING 01/09/23   Lennette Bihari, MD  apixaban (ELIQUIS) 5 MG TABS tablet Take 1 tablet (5 mg total) by mouth 2 (two) times daily.  07/09/23   Mansouraty, Netty Starring., MD  carvedilol (COREG) 12.5 MG tablet Take 12.5 mg by mouth 2 (two) times daily with a meal.    [provider]  cyanocobalamin (VITAMIN B12) 1000 MCG tablet Take 1,000 mcg by mouth daily.    [provider]  dapagliflozin propanediol (FARXIGA) 10 MG TABS tablet Take 10 mg by mouth in the morning. 01/13/21   [provider]  furosemide (LASIX) 20 MG tablet Take 20 mg by mouth daily as needed for edema. 10/31/21   [provider]  isosorbide dinitrate (ISORDIL) 5 MG tablet Take 1 tablet by mouth twice daily 01/09/23   Lennette Bihari, MD  Multiple Vitamins-Minerals (OCUVITE ADULT 50+ PO) Take 1 tablet by mouth daily.    [provider]  Na Sulfate-K Sulfate-Mg Sulf (SUPREP BOWEL PREP KIT) 17.5-3.13-1.6 GM/177ML SOLN Take 1 kit by mouth as directed.  For colonoscopy prep Patient not taking: Reported on 07/05/2023 06/28/23   Mansouraty, Netty Starring., MD  olmesartan (BENICAR) 40 MG tablet Take 40 mg by mouth daily.    [provider]  OZEMPIC, 0.25 OR 0.5 MG/DOSE, 2 MG/3ML SOPN Inject 0.5 mg into the skin once a week. 02/09/23   [provider]  Probiotic Product (PROBIOTIC PO) Take 1 capsule by mouth 2 (two) times a week.    [provider]  rosuvastatin (CRESTOR) 40 MG tablet Take 1 tablet by mouth once daily 05/20/23   Camnitz, Andree Coss, MD  SYNTHROID 112 MCG tablet Take 1 tablet (112 mcg total) by mouth daily before breakfast. 02/22/23   Shamleffer, Konrad Dolores, MD  Vitamin D, Ergocalciferol, (DRISDOL) 1.25 MG (50000 UNIT) CAPS capsule Take 1 capsule by mouth once a week 06/17/23   Irene Limbo, FNP    Family History Family History  Problem Relation Age of Onset   Heart disease Mother    Sudden death Mother    Diabetes Father    Stroke Father    Bone cancer Sister    Sudden death Brother    Melanoma Brother    Heart disease Brother    Colon cancer Neg Hx    Esophageal cancer Neg  Hx    Inflammatory bowel disease Neg Hx    Liver disease Neg Hx    Pancreatic cancer Neg Hx    Rectal cancer Neg Hx    Stomach cancer Neg Hx     Social History Social History   Tobacco Use   Smoking status: Former    Current packs/day: 0.00    Average packs/day: 1 pack/day for 15.0 years (15.0 ttl pk-yrs)    Types: Cigarettes    Start date: 12/11/1975    Quit date: 12/10/1990    Years since quitting: 32.7    Passive exposure: Never   Smokeless tobacco: Never   Tobacco comments:    Former smoker 05/21/22  Vaping Use   Vaping status: Never Used  Substance Use Topics   Alcohol use: Yes    Comment: very rare   Drug use: No     Allergies   Labetalol and Codeine   Review of Systems Review of Systems  Constitutional:  Positive for chills and fatigue.  Musculoskeletal:  Positive for myalgias.     Physical Exam Triage Vital Signs ED Triage Vitals  Encounter Vitals Group     BP 08/31/23 1543 (!) 143/78     Systolic BP Percentile --      Diastolic BP Percentile --      Pulse Rate 08/31/23 1543 61     Resp 08/31/23 1543 18     Temp 08/31/23 1543 98.6 F (37 C)     Temp Source 08/31/23 1543 Oral     SpO2 08/31/23 1543 95 %     Weight --      Height --      Head Circumference --      Peak Flow --      Pain Score 08/31/23 1545 0     Pain Loc --      Pain Education --      Exclude from Growth Chart --    No data found.  Updated Vital Signs BP (!) 143/78 (BP Location: Right Arm)   Pulse 61   Temp 98.6 F (37 C) (Oral)   Resp 18   SpO2 95%   Visual Acuity Right Eye Distance:   Left Eye Distance:  Bilateral Distance:    Right Eye Near:   Left Eye Near:    Bilateral Near:     Physical Exam Vitals and nursing note reviewed.  Constitutional:      General: She is not in acute distress.    Appearance: She is well-developed. She is not ill-appearing.  HENT:     Head: Normocephalic and atraumatic.     Right Ear: Tympanic membrane and ear canal normal.      Left Ear: Tympanic membrane and ear canal normal.     Nose: No congestion or rhinorrhea.     Mouth/Throat:     Mouth: Mucous membranes are moist.     Pharynx: Oropharynx is clear. Uvula midline. No oropharyngeal exudate or posterior oropharyngeal erythema.     Tonsils: No tonsillar exudate or tonsillar abscesses.  Eyes:     Conjunctiva/sclera: Conjunctivae normal.     Pupils: Pupils are equal, round, and reactive to light.  Cardiovascular:     Rate and Rhythm: Normal rate and regular rhythm.     Heart sounds: Normal heart sounds.  Pulmonary:     Effort: Pulmonary effort is normal.     Breath sounds: Normal breath sounds.  Musculoskeletal:     Cervical back: Normal range of motion and neck supple.  Lymphadenopathy:     Cervical: No cervical adenopathy.  Skin:    General: Skin is warm and dry.  Neurological:     General: No focal deficit present.     Mental Status: She is alert and oriented to person, place, and time.  Psychiatric:        Mood and Affect: Mood normal.        Behavior: Behavior normal.      UC Treatments / Results  Labs (all labs ordered are listed, but only abnormal results are displayed) Labs Reviewed  SARS CORONAVIRUS 2 (TAT 6-24 HRS)  POCT INFLUENZA A/B    EKG   Radiology No results found.  Procedures Procedures (including critical care time)  Medications Ordered in UC Medications - No data to display  Initial Impression / Assessment and Plan / UC Course  I have reviewed the triage vital signs and the nursing notes.  Pertinent labs & imaging results that were available during my care of the patient were reviewed by me and considered in my medical decision making (see chart for details).     Reviewed exam and symptoms with patient.  No red flags.  Negative rapid flu.  COVID PCR and will contact if positive.  Discussed viral illness and symptomatic treatment.  PCP follow-up 2 days for recheck.  ER precautions reviewed and patient verbalized  understanding. Final Clinical Impressions(s) / UC Diagnoses   Final diagnoses:  Body aches  Viral illness     Discharge Instructions      The clinic will contact you with results of the COVID test done today if positive.  Continue Tylenol over-the-counter as needed.  Lots of rest and fluids.  Please follow-up with your PCP in 2 days for recheck.  Please go to the ER if you develop any worsening symptoms.  I hope you feel better soon!     ED Prescriptions   None    PDMP not reviewed this encounter.   Radford Pax, NP 08/31/23 [phone]

## 2023-08-31 NOTE — ED Triage Notes (Signed)
Pt reports chills, body aches and fatigue x 1 day, after working on the yard.

## 2023-09-02 ENCOUNTER — Encounter: Payer: Self-pay | Admitting: Internal Medicine

## 2023-09-02 ENCOUNTER — Other Ambulatory Visit: Payer: Self-pay | Admitting: Cardiology

## 2023-09-02 ENCOUNTER — Ambulatory Visit: Payer: Medicare PPO | Admitting: Internal Medicine

## 2023-09-02 VITALS — BP 120/84 | HR 60 | Ht 65.0 in | Wt 196.0 lb

## 2023-09-02 DIAGNOSIS — E890 Postprocedural hypothyroidism: Secondary | ICD-10-CM

## 2023-09-02 DIAGNOSIS — R7303 Prediabetes: Secondary | ICD-10-CM

## 2023-09-02 LAB — TSH: TSH: 5.58 u[IU]/mL — ABNORMAL HIGH (ref 0.35–5.50)

## 2023-09-02 LAB — VITAMIN D 25 HYDROXY (VIT D DEFICIENCY, FRACTURES): VITD: 45.79 ng/mL (ref 30.00–100.00)

## 2023-09-02 LAB — HEMOGLOBIN A1C: Hgb A1c MFr Bld: 5.6 % (ref 4.6–6.5)

## 2023-09-02 LAB — SARS CORONAVIRUS 2 (TAT 6-24 HRS): SARS Coronavirus 2: NEGATIVE

## 2023-09-02 NOTE — Progress Notes (Unsigned)
Name: Claudia Cantrell  MRN/ DOB: [id number], 06-04-1946    Age/ Sex: 77 y.o., female    PCP: Avanell Shackleton, NP-C   Reason for Endocrinology Evaluation: Hypothyroidism     Date of Initial Endocrinology Evaluation: 09/02/2023     HPI: [name] is a 77 y.o. female with a past medical history of postoperative hypothyroidism, HTN, dyslipidemia and A.Fib . The patient presented for initial endocrinology clinic visit on 09/02/2023 for consultative assistance with her Hypothyroidism.   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   She was seen by urgent care for URI 08/2023 She is s/p EGD with [facility] 06/2023 She had a follow-up with cardiology,  amiodarone  was started in June 2023 She is S/P cardio ablation 12/2022 She continues to follow-up with rheumatology for gout  She continues to be on Ozempic through nephrology which was started 01/2023 She follows with healthy weight and wellness clinic, they typically prescribe her vitamin D, requesting refill pharmacy  She has recent cataract sx with complications Has lost her brother recently   Weight stable Denies local neck swelling  Denies constipation or diarrhea    Synthroid 112 mcg daily      HISTORY:  Past Medical History:  Past Medical History:  Diagnosis Date   Arthritis    rheumatoid   Back pain    CAD (coronary artery disease)    a. 1992 s/p MI and PTCA of unknown vessel;  b. 09/2010 Cath: LM nl, LAD 20p, LCX 66m, RCA 45m, RPL 20.   Cancer [facility])    Cervical cancer (HCC) 1977   Chest pain    Chronic kidney disease    Constipation    Family history of anesthesia complication     daughter has difficulty waking    GERD (gastroesophageal reflux disease)    occ   Gout 10/08/2016   History of heart attack    Hyperlipidemia    Hypertensive heart disease    Hypothyroidism    Joint pain    Nonischemic cardiomyopathy (HCC)    a. 07/2016 Echo: EF 40-45%, mild LVH, inferior akinesis, moderately dilated left atrium, trivial AI and MR.   PAF (paroxysmal atrial fibrillation) (HCC)    a. 07/2016 Admitted w/ AF RVR-->CHA2DS2VASc = 5-->Eliquis;  b. 07/2016 successful TEE/DCCV.   Pre-diabetes    Sleep apnea    a. Using CPAP.   SOB (shortness of breath)    Vitamin D deficiency    Past Surgical History:  Past Surgical History:  Procedure Laterality Date   ABDOMINAL HYSTERECTOMY  1988   ATRIAL FIBRILLATION ABLATION N/A 07/28/2021   Procedure: ATRIAL FIBRILLATION ABLATION;  Surgeon: Regan Lemming, MD;  [facility]: [facility];  Service: Cardiovascular;  Laterality: N/A;   ATRIAL FIBRILLATION ABLATION N/A 12/14/2022   Procedure: ATRIAL FIBRILLATION ABLATION;  Surgeon: Regan Lemming, MD;  Location: [facility];  Service: Cardiovascular;  Laterality: N/A;   BACK SURGERY  1994   BIOPSY  09/02/2019   Procedure: BIOPSY;  Surgeon: Meridee Score Netty Starring., MD;  Location: [facility] ENDOSCOPY;  Service: Gastroenterology;;   BIOPSY  12/09/2019   Procedure: BIOPSY;  Surgeon: Lemar Lofty., MD;  Location: [facility];  Service: Gastroenterology;;   BIOPSY  03/08/2021   Procedure: BIOPSY;  Surgeon: Lemar Lofty., MD;  Location: [facility] ENDOSCOPY;  Service: Gastroenterology;;   BIOPSY  07/08/2023   Procedure: BIOPSY;  Surgeon: Lemar Lofty., MD;  Location: [facility] ENDOSCOPY;  Service: Gastroenterology;;   CARDIAC CATHETERIZATION  1991 AND 1992   PTCA BY DR Meryl Crutch   CARDIOVERSION N/A 08/01/2016   Procedure: CARDIOVERSION;  Surgeon: Lewayne Bunting, MD;  Location: [facility] ENDOSCOPY;  Service: Cardiovascular;  Laterality: N/A;   CARDIOVERSION N/A 01/11/2020    Procedure: CARDIOVERSION;  Surgeon: Wendall Stade, MD;  Location: [facility] ENDOSCOPY;  Service: Cardiovascular;  Laterality: N/A;   CARDIOVERSION N/A 07/08/2020   Procedure: CARDIOVERSION;  Surgeon: Little Ishikawa, MD;  Location: [facility] ENDOSCOPY;  Service: Cardiovascular;  Laterality: N/A;   CARDIOVERSION N/A 05/31/2022   Procedure: CARDIOVERSION;  Surgeon: Chrystie Nose, MD;  Location: [facility] ENDOSCOPY;  Service: Cardiovascular;  Laterality: N/A;   CARDIOVERSION N/A 07/18/2022   Procedure: CARDIOVERSION;  Surgeon: Little Ishikawa, MD;  Location: [facility] ENDOSCOPY;  Service: Cardiovascular;  Laterality: N/A;   ENDOSCOPIC MUCOSAL RESECTION N/A 12/09/2019   Procedure: ENDOSCOPIC MUCOSAL RESECTION;  Surgeon: Lemar Lofty., MD;  Location: [facility] ENDOSCOPY;  Service: Gastroenterology;  Laterality: N/A;   ESOPHAGOGASTRODUODENOSCOPY N/A 03/08/2021   Procedure: ESOPHAGOGASTRODUODENOSCOPY (EGD);  Surgeon: Lemar Lofty., MD;  Location: [facility] ENDOSCOPY;  Service: Gastroenterology;  Laterality: N/A;   ESOPHAGOGASTRODUODENOSCOPY (EGD) WITH PROPOFOL N/A 09/02/2019   Procedure: ESOPHAGOGASTRODUODENOSCOPY (EGD) WITH PROPOFOL;  Surgeon: Meridee Score Netty Starring., MD;  Location: [facility] ENDOS[facility];  Service: Gastroenterology;  Laterality: N/A;   ESOPHAGOGASTRODUODENOSCOPY (EGD) WITH PROPOFOL N/A 12/09/2019   Procedure: ESOPHAGOGASTRODUODENOSCOPY (EGD) WITH PROPOFOL;  Surgeon: Meridee Score Netty Starring., MD;  Location: [facility]OS[facility];  Service: Gastroenterology;  Laterality: N/A;   ESOPHAGOGASTRODUODENOSCOPY (EGD) WITH PROPOFOL N/A 07/08/2023   Procedure: ESOPHAGOGASTRODUODENOSCOPY (EGD) WITH PROPOFOL;  Surgeon: Meridee Score Netty Starring., MD;  Location: [facility]OS[facility];  Service: Gastroenterology;  Laterality: N/A;   EUS N/A 09/02/2019   Procedure: UPPER ENDOSCOPIC ULTRASOUND (EUS) RADIAL;  Surgeon: Lemar Lofty., MD;  Location: [facility]OS[facility];  Service: Gastroenterology;  Laterality: N/A;  EUS radial/linear   EUS  N/A 12/09/2019   Procedure: UPPER ENDOSCOPIC ULTRASOUND (EUS) RADIAL;  Surgeon: Lemar Lofty., MD;  Location: [facility] ENDOS[facility];  Service: Gastroenterology;  Laterality: N/A;   EUS  12/09/2019   Procedure: UPPER ENDOSCOPIC ULTRASOUND (EUS) LINEAR;  Surgeon: Lemar Lofty., MD;  Location: [facility] ENDOSCOPY;  Service: Gastroenterology;;   EUS N/A 03/08/2021   Procedure: UPPER ENDOSCOPIC ULTRASOUND (EUS) RADIAL;  Surgeon: Lemar Lofty., MD;  Location: [facility] ENDOSCOPY;  Service: Gastroenterology;  Laterality: N/A;   FINE NEEDLE ASPIRATION  09/02/2019   Procedure: FINE NEEDLE ASPIRATION (FNA) LINEAR;  Surgeon: Lemar Lofty., MD;  Location: [facility] ENDOSCOPY;  Service: Gastroenterology;;   FINE NEEDLE ASPIRATION  12/09/2019   Procedure: FINE NEEDLE ASPIRATION (FNA) LINEAR;  Surgeon: Lemar Lofty., MD;  Location: [facility] ENDOSCOPY;  Service: Gastroenterology;;   HEMOSTASIS CLIP PLACEMENT  12/09/2019   Procedure: HEMOSTASIS CLIP PLACEMENT;  Surgeon: Lemar Lofty., MD;  Location: [facility] ENDOSCOPY;  Service: Gastroenterology;;   ORIF ANKLE FRACTURE Right 04/27/2015   Procedure: OPEN REDUCTION INTERNAL FIXATION (ORIF) RIGHT BIMALLEOLAR ANKLE FRACTURE WITH SYNDESMOSIS FIXATION;  Surgeon: Tarry Kos, MD;  Location: [facility] OR;  Service: Orthopedics;  Laterality: Right;   SUBMUCOSAL LIFTING INJECTION  12/09/2019   Procedure: SUBMUCOSAL LIFTING INJECTION;  Surgeon: Lemar Lofty.,  MD;  Location: [facility] ENDOSCOPY;  Service: Gastroenterology;;   TEE WITHOUT CARDIOVERSION N/A 08/01/2016   Procedure: TRANSESOPHAGEAL ECHOCARDIOGRAM (TEE);  Surgeon: Lewayne Bunting, MD;  Location: [facility] ENDOSCOPY;  Service: Cardiovascular;  Laterality: N/A;   THYROIDECTOMY  11/24/2013   DR Derrell Lolling   THYROIDECTOMY N/A 11/24/2013   Procedure: TOTAL THYROIDECTOMY;  Surgeon: Ernestene Mention, MD;  Location: [facility] OR;  Service: General;  Laterality: N/A;   TRANSTHORACIC ECHOCARDIOGRAM  01/15/2013   EF 55% TO  65%. PROBABLE MILD HYPOKINESIS OF THE INFERIOR MYOCARDIUM. GRADE 1 DIASTOLIC DYSFUNCTION. TRIAL AR.LA IS MILDLY DILATED.   UPPER ESOPHAGEAL ENDOSCOPIC ULTRASOUND (EUS) N/A 07/08/2023   Procedure: UPPER ESOPHAGEAL ENDOSCOPIC ULTRASOUND (EUS);  Surgeon: Lemar Lofty., MD;  Location: [facility] ENDOSCOPY;  Service: Gastroenterology;  Laterality: N/A;    Social History:  reports that she quit smoking about 32 years ago. Her smoking use included cigarettes. She started smoking about 47 years ago. She has a 15 pack-year smoking history. She has never been exposed to tobacco smoke. She has never used smokeless tobacco. She reports current alcohol use. She reports that she does not use drugs. Family History: family history includes Bone cancer in her sister; Diabetes in her father; Heart disease in her brother and mother; Melanoma in her brother; Stroke in her father; Sudden death in her brother and mother.   HOME MEDICATIONS: Allergies as of 09/02/2023       Reactions   Labetalol    headaches   Codeine Anxiety        Medication List        Accurate as of September 02, 2023  8:33 AM. If you have any questions, ask your nurse or doctor.          acetaminophen 500 MG tablet Commonly known as: TYLENOL Take 1,000 mg by mouth every 6 (six) hours as needed for moderate pain.   allopurinol 100 MG tablet Commonly known as: ZYLOPRIM Take 1 tablet (100 mg total) by mouth daily.   amLODipine 5 MG tablet Commonly known as: NORVASC TAKE 1 & 1/2 (ONE & ONE-HALF) TABLETS BY MOUTH IN THE EVENING   apixaban 5 MG Tabs tablet Commonly known as: Eliquis Take 1 tablet (5 mg total) by mouth 2 (two) times daily.   carvedilol 12.5 MG tablet Commonly known as: COREG Take 12.5 mg by mouth 2 (two) times daily with a meal.   cyanocobalamin 1000 MCG tablet Commonly known as: VITAMIN B12 Take 1,000 mcg by mouth daily.   dapagliflozin propanediol 10 MG Tabs tablet Commonly known as: FARXIGA Take 10  mg by mouth in the morning.   furosemide 20 MG tablet Commonly known as: LASIX Take 20 mg by mouth daily as needed for edema.   isosorbide dinitrate 5 MG tablet Commonly known as: ISORDIL Take 1 tablet by mouth twice daily   Na Sulfate-K Sulfate-Mg Sulf 17.5-3.13-1.6 GM/177ML Soln Commonly known as: Suprep Bowel Prep Kit Take 1 kit by mouth as directed. For colonoscopy prep   OCUVITE ADULT 50+ PO Take 1 tablet by mouth daily.   ofloxacin 0.3 % ophthalmic solution Commonly known as: OCUFLOX Place into the right eye.   olmesartan 40 MG tablet Commonly known as: BENICAR Take 40 mg by mouth daily.   Ozempic (0.25 or 0.5 MG/DOSE) 2 MG/3ML Sopn Generic drug: Semaglutide(0.25 or 0.5MG /DOS) Inject 0.5 mg into the skin once a week.   prednisoLONE acetate 1 % ophthalmic suspension Commonly known as: PRED FORTE Place into the right eye.  PROBIOTIC PO Take 1 capsule by mouth 2 (two) times a week.   rosuvastatin 40 MG tablet Commonly known as: CRESTOR Take 1 tablet by mouth once daily   Synthroid 112 MCG tablet Generic drug: levothyroxine Take 1 tablet (112 mcg total) by mouth daily before breakfast.   Vitamin D (Ergocalciferol) 1.25 MG (50000 UNIT) Caps capsule Commonly known as: DRISDOL Take 1 capsule by mouth once a week          REVIEW OF SYSTEMS: A comprehensive ROS was conducted with the patient and is negative except as per HPI    OBJECTIVE:  VS: BP 120/84 (BP Location: Left Arm, Patient Position: Sitting, Cuff Size: Small)   Pulse 60   Ht 5\' 5"  (1.651 m)   Wt 196 lb (88.9 kg)   SpO2 96%   BMI 32.62 kg/m   Wt Readings from Last 3 Encounters:  09/02/23 196 lb (88.9 kg)  07/08/23 194 lb (88 kg)  06/28/23 193 lb 9.6 oz (87.8 kg)     EXAM: General: Pt appears well and is in NAD  Neck: General: Supple without adenopathy. Thyroid: Thyroid size normal.  No goiter or nodules appreciated.  Lungs: Clear with good BS bilat with no rales, rhonchi, or  wheezes  Heart: Auscultation: RRR.  Abdomen: Normoactive bowel sounds, soft, nontender, without masses or organomegaly palpable  Extremities:  BL LE: No pretibial edema   Mental Status: Judgment, insight: Intact Orientation: Oriented to time, place, and person Mood and affect: No depression, anxiety, or agitation     DATA REVIEWED:   Latest Reference Range & Units 02/20/23 09:22  Hemoglobin A1C 4.6 - 6.5 % 6.0  TSH 0.35 - 5.50 uIU/mL 11.58 (H)  (H): Data is abnormally high  Latest Reference Range & Units 12/11/22 12:04  Sodium 135 - 146 mmol/L 147 (H)  Potassium 3.5 - 5.3 mmol/L 4.0  Chloride 98 - 110 mmol/L 107  CO2 20 - 32 mmol/L 29  Glucose 65 - 99 mg/dL 82  BUN 7 - 25 mg/dL 13  Creatinine 4.09 - 8.11 mg/dL 9.14 (H)  Calcium 8.6 - 10.4 mg/dL 9.7  BUN/Creatinine Ratio 6 - 22 (calc) 9  eGFR > OR = 60 mL/min/1.53m2 38 (L)  AG Ratio 1.0 - 2.5 (calc) 1.6  Uric Acid, Serum 2.5 - 7.0 mg/dL 2.4 (L)  AST 10 - 35 U/L 19  ALT 6 - 29 U/L 18  Total Protein 6.1 - 8.1 g/dL 8.0  Total Bilirubin 0.2 - 1.2 mg/dL 0.7    ASSESSMENT/PLAN/RECOMMENDATIONS:   Postoperative Hypothyroidism    - Pt is  clinically euthyroid  - No local neck symptoms - TSH is elevated, will increase Synthroid as below   Medications :  Stop Synthroid 100 mcg daily  Start Synthroid 112 mcg daily    2. Pre-diabetes :  -A1c has increased from 5.7% to 6.0% -She is on [place] through cardiology -She was started on Ozempic through the weight loss program   F/U 6 months   Signed electronically by: Lyndle Herrlich, MD  [facility] Endocrinology  [facility] [address] [place] [place], [place] [address] Phone: [phone] FAX: [phone]   CC: Avanell Shackleton, NP-C [address] [place] [address] Phone: [phone] Fax: [phone]   Return to Endocrinology clinic as below: Future Appointments  Date Time Provider Department Center  09/13/2023  9:30 AM  Regan Lemming, MD [id number]  09/20/2023  2:30 PM Mansouraty, Netty Starring., MD [id number]  09/27/2023  2:20 PM Avanell Shackleton, NP-C [facility] None  01/27/2024 11:00 AM Malachy Mood, MD [facility] None

## 2023-09-02 NOTE — Patient Instructions (Signed)

## 2023-09-03 ENCOUNTER — Telehealth: Payer: Self-pay | Admitting: Internal Medicine

## 2023-09-03 ENCOUNTER — Other Ambulatory Visit: Payer: Self-pay | Admitting: Cardiovascular Disease

## 2023-09-03 MED ORDER — SYNTHROID 112 MCG PO TABS: 112.0000 ug | ORAL_TABLET | Freq: Every day | ORAL | 2 refills | Status: DC

## 2023-09-03 NOTE — Telephone Encounter (Signed)
Please let the patient know that her thyroid test is slightly off, I am concerned that she is missing more doses than she thinks, because 5 months ago it was perfect on the same dose  I already told her yesterday to start using a pillbox, please reiterate that for her    Her A1c has increased, I will suggest that she continues to use Ozempic as prescribed by her kidney doctor   Vitamin D is normal, she does not need a prescription vitamin D anymore but she must get over-the-counter vitamin D3 at 2000 units daily   Thanks

## 2023-09-03 NOTE — Telephone Encounter (Signed)
Patient is aware and verbalized understanding.

## 2023-09-06 ENCOUNTER — Ambulatory Visit (INDEPENDENT_AMBULATORY_CARE_PROVIDER_SITE_OTHER): Payer: Medicare PPO | Admitting: Internal Medicine

## 2023-09-06 ENCOUNTER — Ambulatory Visit (INDEPENDENT_AMBULATORY_CARE_PROVIDER_SITE_OTHER): Payer: Medicare PPO

## 2023-09-06 ENCOUNTER — Encounter: Payer: Self-pay | Admitting: Internal Medicine

## 2023-09-06 VITALS — BP 134/76 | HR 70 | Temp 99.1°F | Ht 65.0 in | Wt 194.0 lb

## 2023-09-06 DIAGNOSIS — R1011 Right upper quadrant pain: Secondary | ICD-10-CM

## 2023-09-06 DIAGNOSIS — I700 Atherosclerosis of aorta: Secondary | ICD-10-CM | POA: Diagnosis not present

## 2023-09-06 DIAGNOSIS — R509 Fever, unspecified: Secondary | ICD-10-CM

## 2023-09-06 DIAGNOSIS — J069 Acute upper respiratory infection, unspecified: Secondary | ICD-10-CM | POA: Diagnosis not present

## 2023-09-06 DIAGNOSIS — I10 Essential (primary) hypertension: Secondary | ICD-10-CM | POA: Diagnosis not present

## 2023-09-06 DIAGNOSIS — N182 Chronic kidney disease, stage 2 (mild): Secondary | ICD-10-CM | POA: Diagnosis not present

## 2023-09-06 DIAGNOSIS — E559 Vitamin D deficiency, unspecified: Secondary | ICD-10-CM

## 2023-09-06 LAB — HEPATIC FUNCTION PANEL
ALT: 11 U/L (ref 0–35)
AST: 16 U/L (ref 0–37)
Albumin: 4.3 g/dL (ref 3.5–5.2)
Alkaline Phosphatase: 77 U/L (ref 39–117)
Bilirubin, Direct: 0.2 mg/dL (ref 0.0–0.3)
Total Bilirubin: 0.7 mg/dL (ref 0.2–1.2)
Total Protein: 7.5 g/dL (ref 6.0–8.3)

## 2023-09-06 LAB — CBC WITH DIFFERENTIAL/PLATELET
Basophils Absolute: 0 10*3/uL (ref 0.0–0.1)
Basophils Relative: 0.6 % (ref 0.0–3.0)
Eosinophils Absolute: 0 10*3/uL (ref 0.0–0.7)
Eosinophils Relative: 0.4 % (ref 0.0–5.0)
HCT: 49.1 % — ABNORMAL HIGH (ref 36.0–46.0)
Hemoglobin: 16 g/dL — ABNORMAL HIGH (ref 12.0–15.0)
Lymphocytes Relative: 16 % (ref 12.0–46.0)
Lymphs Abs: 0.8 10*3/uL (ref 0.7–4.0)
MCHC: 32.5 g/dL (ref 30.0–36.0)
MCV: 99.5 fL (ref 78.0–100.0)
Monocytes Absolute: 0.6 10*3/uL (ref 0.1–1.0)
Monocytes Relative: 12.2 % — ABNORMAL HIGH (ref 3.0–12.0)
Neutro Abs: 3.5 10*3/uL (ref 1.4–7.7)
Neutrophils Relative %: 70.8 % (ref 43.0–77.0)
Platelets: 174 10*3/uL (ref 150.0–400.0)
RBC: 4.93 Mil/uL (ref 3.87–5.11)
RDW: 14.1 % (ref 11.5–15.5)
WBC: 4.9 10*3/uL (ref 4.0–10.5)

## 2023-09-06 LAB — URINALYSIS, ROUTINE W REFLEX MICROSCOPIC
Bilirubin Urine: NEGATIVE
Leukocytes,Ua: NEGATIVE
Nitrite: NEGATIVE
Specific Gravity, Urine: 1.015 (ref 1.000–1.030)
Total Protein, Urine: 100 — AB
Urine Glucose: >=1000 — AB
Urobilinogen, UA: 1 (ref 0.0–1.0)
pH: 7 (ref 5.0–8.0)

## 2023-09-06 LAB — BASIC METABOLIC PANEL
BUN: 13 mg/dL (ref 6–23)
CO2: 26 meq/L (ref 19–32)
Calcium: 9.5 mg/dL (ref 8.4–10.5)
Chloride: 106 meq/L (ref 96–112)
Creatinine, Ser: 1.23 mg/dL — ABNORMAL HIGH (ref 0.40–1.20)
GFR: 43.09 mL/min — ABNORMAL LOW (ref >60.00)
Glucose, Bld: 82 mg/dL (ref 70–99)
Potassium: 4 meq/L (ref 3.5–5.1)
Sodium: 140 meq/L (ref 135–145)

## 2023-09-06 LAB — LIPASE: Lipase: 20 U/L (ref 11.0–59.0)

## 2023-09-06 MED ORDER — AMOXICILLIN-POT CLAVULANATE 875-125 MG PO TABS: 1.0000 | ORAL_TABLET | Freq: Two times a day (BID) | ORAL | 0 refills | Status: DC

## 2023-09-06 MED ORDER — CARVEDILOL 12.5 MG PO TABS: 12.5000 mg | ORAL_TABLET | Freq: Two times a day (BID) | ORAL | 0 refills | Status: DC

## 2023-09-06 NOTE — Progress Notes (Unsigned)
Patient ID: Claudia Cantrell, female   DOB: 1945-12-28, 77 y.o.   MRN: [id number]        Chief Complaint: follow up fever, chills , ruq pain, htn, ckd3a, low vit d       HPI:  Claudia Cantrell is a 77 y.o. female here with c/o 3 wks onset subjective fever chills ongoing persistent with intermittent mild non prod cough, seen at [facility] sept 21 with neg covid and flu, Otc meds such as tylenol not helping, has mild sob.  BP meds being held due to lower bp's at home =.  Pt denies chest pain, wheezing, orthopnea, PND, increased LE swelling, palpitations, dizziness or syncope.  Pt denies polydipsia, polyuria, or new focal neuro s/s.    Pt denies fever, wt loss, night sweats, loss of appetite, or other constitutional symptoms  Denies urinary symptoms such as dysuria, frequency, urgency, flank pain, hematuria        Wt Readings from Last 3 Encounters:  09/06/23 194 lb (88 kg)  09/02/23 196 lb (88.9 kg)  07/08/23 194 lb (88 kg)   BP Readings from Last 3 Encounters:  09/06/23 134/76  09/02/23 120/84  08/31/23 (!) 143/78         Past Medical History:  Diagnosis Date   Arthritis    rheumatoid   Back pain    CAD (coronary artery disease)    a. 1992 s/p MI and PTCA of unknown vessel;  b. 09/2010 Cath: LM nl, LAD 20p, LCX 56m, RCA 55m, RPL 20.   Cancer [facility])    Cervical cancer (HCC) 1977   Chest pain    Chronic kidney disease    Constipation    Family history of anesthesia complication    daughter has difficulty waking    GERD (gastroesophageal reflux disease)    occ   Gout 10/08/2016   History of heart attack    Hyperlipidemia    Hypertensive heart disease    Hypothyroidism    Joint pain    Nonischemic cardiomyopathy (HCC)    a. 07/2016 Echo: EF 40-45%, mild LVH, inferior akinesis, moderately dilated left atrium, trivial AI and MR.   PAF (paroxysmal atrial fibrillation) (HCC)    a. 07/2016 Admitted w/ AF RVR-->CHA2DS2VASc = 5-->Eliquis;  b. 07/2016 successful TEE/DCCV.   Pre-diabetes    Sleep apnea     a. Using CPAP.   SOB (shortness of breath)    Vitamin D deficiency    Past Surgical History:  Procedure Laterality Date   ABDOMINAL HYSTERECTOMY  1988   ATRIAL FIBRILLATION ABLATION N/A 07/28/2021   Procedure: ATRIAL FIBRILLATION ABLATION;  Surgeon: Regan Lemming, MD;  Location: [facility];  Service: Cardiovascular;  Laterality: N/A;   ATRIAL FIBRILLATION ABLATION N/A 12/14/2022   Procedure: ATRIAL FIBRILLATION ABLATION;  Surgeon: Regan Lemming, MD;  Location: [facility];  Service: Cardiovascular;  Laterality: N/A;   BACK SURGERY  1994   BIOPSY  09/02/2019   Procedure: BIOPSY;  Surgeon: Meridee Score Netty Starring., MD;  Location: [facility] ENDOSCOPY;  Service: Gastroenterology;;   BIOPSY  12/09/2019   Procedure: BIOPSY;  Surgeon: Lemar Lofty., MD;  Location: [facility]OSCOPY;  Service: Gastroenterology;;   BIOPSY  03/08/2021   Procedure: BIOPSY;  Surgeon: Lemar Lofty., MD;  Location: [facility];  Service: Gastroenterology;;   BIOPSY  07/08/2023   Procedure: BIOPSY;  Surgeon: Lemar Lofty., MD;  Location: [facility] ENDOSCOPY;  Service: Gastroenterology;;   CARDIAC CATHETERIZATION  1991 AND 1992  PTCA BY DR Meryl Crutch   CARDIOVERSION N/A 08/01/2016   Procedure: CARDIOVERSION;  Surgeon: Lewayne Bunting, MD;  Location: [facility] ENDOSCOPY;  Service: Cardiovascular;  Laterality: N/A;   CARDIOVERSION N/A 01/11/2020   Procedure: CARDIOVERSION;  Surgeon: Wendall Stade, MD;  Location: [facility] ENDOSCOPY;  Service: Cardiovascular;  Laterality: N/A;   CARDIOVERSION N/A 07/08/2020   Procedure: CARDIOVERSION;  Surgeon: Little Ishikawa, MD;  Location: [facility] ENDOSCOPY;  Service: Cardiovascular;  Laterality: N/A;   CARDIOVERSION N/A 05/31/2022   Procedure: CARDIOVERSION;  Surgeon: Chrystie Nose, MD;  Location: [facility] ENDOSCOPY;  Service: Cardiovascular;  Laterality: N/A;   CARDIOVERSION N/A 07/18/2022   Procedure: CARDIOVERSION;  Surgeon: Little Ishikawa, MD;   Location: [facility] ENDOSCOPY;  Service: Cardiovascular;  Laterality: N/A;   ENDOSCOPIC MUCOSAL RESECTION N/A 12/09/2019   Procedure: ENDOSCOPIC MUCOSAL RESECTION;  Surgeon: Lemar Lofty., MD;  Location: [facility] ENDOSCOPY;  Service: Gastroenterology;  Laterality: N/A;   ESOPHAGOGASTRODUODENOSCOPY N/A 03/08/2021   Procedure: ESOPHAGOGASTRODUODENOSCOPY (EGD);  Surgeon: Lemar Lofty., MD;  Location: [facility] ENDOSCOPY;  Service: Gastroenterology;  Laterality: N/A;   ESOPHAGOGASTRODUODENOSCOPY (EGD) WITH PROPOFOL N/A 09/02/2019   Procedure: ESOPHAGOGASTRODUODENOSCOPY (EGD) WITH PROPOFOL;  Surgeon: Meridee Score Netty Starring., MD;  Location: [facility] ENDOSCOPY;  Service: Gastroenterology;  Laterality: N/A;   ESOPHAGOGASTRODUODENOSCOPY (EGD) WITH PROPOFOL N/A 12/09/2019   Procedure: ESOPHAGOGASTRODUODENOSCOPY (EGD) WITH PROPOFOL;  Surgeon: Meridee Score Netty Starring., MD;  Location: [facility] ENDOSCOPY;  Service: Gastroenterology;  Laterality: N/A;   ESOPHAGOGASTRODUODENOSCOPY (EGD) WITH PROPOFOL N/A 07/08/2023   Procedure: ESOPHAGOGASTRODUODENOSCOPY (EGD) WITH PROPOFOL;  Surgeon: Meridee Score Netty Starring., MD;  Location: [facility] ENDOSCOPY;  Service: Gastroenterology;  Laterality: N/A;   EUS N/A 09/02/2019   Procedure: UPPER ENDOSCOPIC ULTRASOUND (EUS) RADIAL;  Surgeon: Lemar Lofty., MD;  Location: [facility] ENDOSCOPY;  Service: Gastroenterology;  Laterality: N/A;  EUS radial/linear   EUS N/A 12/09/2019   Procedure: UPPER ENDOSCOPIC ULTRASOUND (EUS) RADIAL;  Surgeon: Lemar Lofty., MD;  Location: [facility] ENDOSCOPY;  Service: Gastroenterology;  Laterality: N/A;   EUS  12/09/2019   Procedure: UPPER ENDOSCOPIC ULTRASOUND (EUS) LINEAR;  Surgeon: Lemar Lofty., MD;  Location: [facility] ENDOSCOPY;  Service: Gastroenterology;;   EUS N/A 03/08/2021   Procedure: UPPER ENDOSCOPIC ULTRASOUND (EUS) RADIAL;  Surgeon: Lemar Lofty., MD;  Location: [facility] ENDOSCOPY;  Service: Gastroenterology;  Laterality: N/A;   FINE NEEDLE  ASPIRATION  09/02/2019   Procedure: FINE NEEDLE ASPIRATION (FNA) LINEAR;  Surgeon: Lemar Lofty., MD;  Location: [facility] ENDOSCOPY;  Service: Gastroenterology;;   FINE NEEDLE ASPIRATION  12/09/2019   Procedure: FINE NEEDLE ASPIRATION (FNA) LINEAR;  Surgeon: Lemar Lofty., MD;  Location: [facility] ENDOSCOPY;  Service: Gastroenterology;;   HEMOSTASIS CLIP PLACEMENT  12/09/2019   Procedure: HEMOSTASIS CLIP PLACEMENT;  Surgeon: Lemar Lofty., MD;  Location: [facility] ENDOSCOPY;  Service: Gastroenterology;;   ORIF ANKLE FRACTURE Right 04/27/2015   Procedure: OPEN REDUCTION INTERNAL FIXATION (ORIF) RIGHT BIMALLEOLAR ANKLE FRACTURE WITH SYNDESMOSIS FIXATION;  Surgeon: Tarry Kos, MD;  Location: [facility] OR;  Service: Orthopedics;  Laterality: Right;   SUBMUCOSAL LIFTING INJECTION  12/09/2019   Procedure: SUBMUCOSAL LIFTING INJECTION;  Surgeon: Lemar Lofty., MD;  Location: [facility] ENDOSCOPY;  Service: Gastroenterology;;   TEE WITHOUT CARDIOVERSION N/A 08/01/2016   Procedure: TRANSESOPHAGEAL ECHOCARDIOGRAM (TEE);  Surgeon: Lewayne Bunting, MD;  Location: [facility] ENDOSCOPY;  Service: Cardiovascular;  Laterality: N/A;   THYROIDECTOMY  11/24/2013   DR Derrell Lolling   THYROIDECTOMY N/A 11/24/2013   Procedure: TOTAL THYROIDECTOMY;  Surgeon: Ernestene Mention, MD;  Location: [facility] OR;  Service: General;  Laterality: N/A;   TRANSTHORACIC ECHOCARDIOGRAM  01/15/2013   EF 55% TO 65%. PROBABLE MILD HYPOKINESIS OF THE INFERIOR MYOCARDIUM. GRADE 1 DIASTOLIC DYSFUNCTION. TRIAL AR.LA IS MILDLY DILATED.   UPPER ESOPHAGEAL ENDOSCOPIC ULTRASOUND (EUS) N/A 07/08/2023   Procedure: UPPER ESOPHAGEAL ENDOSCOPIC ULTRASOUND (EUS);  Surgeon: Lemar Lofty., MD;  Location: [facility]COPY;  Service: Gastroenterology;  Laterality: N/A;    reports that she quit smoking about 32 years ago. Her smoking use included cigarettes. She started smoking about 47 years ago. She has a 15 pack-year smoking history. She has never been exposed to  tobacco smoke. She has never used smokeless tobacco. She reports current alcohol use. She reports that she does not use drugs. family history includes Bone cancer in her sister; Diabetes in her father; Heart disease in her brother and mother; Melanoma in her brother; Stroke in her father; Sudden death in her brother and mother. Allergies  Allergen Reactions   Labetalol     headaches   Codeine Anxiety   Current Outpatient Medications on File Prior to Visit  Medication Sig Dispense Refill   acetaminophen (TYLENOL) 500 MG tablet Take 1,000 mg by mouth every 6 (six) hours as needed for moderate pain.     allopurinol (ZYLOPRIM) 100 MG tablet Take 1 tablet (100 mg total) by mouth daily. 30 tablet 2   amLODipine (NORVASC) 5 MG tablet TAKE 1 & 1/2 (ONE & ONE-HALF) TABLETS BY MOUTH IN THE EVENING 135 tablet 0   apixaban (ELIQUIS) 5 MG TABS tablet Take 1 tablet (5 mg total) by mouth 2 (two) times daily.     cyanocobalamin (VITAMIN B12) 1000 MCG tablet Take 1,000 mcg by mouth daily.     dapagliflozin propanediol (FARXIGA) 10 MG TABS tablet Take 10 mg by mouth in the morning.     furosemide (LASIX) 20 MG tablet Take 20 mg by mouth daily as needed for edema.     isosorbide dinitrate (ISORDIL) 5 MG tablet Take 1 tablet by mouth twice daily 180 tablet 0   Multiple Vitamins-Minerals (OCUVITE ADULT 50+ PO) Take 1 tablet by mouth daily.     Na Sulfate-K Sulfate-Mg Sulf (SUPREP BOWEL PREP KIT) 17.5-3.13-1.6 GM/177ML SOLN Take 1 kit by mouth as directed. For colonoscopy prep 354 mL 0   ofloxacin (OCUFLOX) 0.3 % ophthalmic solution Place into the right eye.     olmesartan (BENICAR) 40 MG tablet Take 40 mg by mouth daily.     OZEMPIC, 0.25 OR 0.5 MG/DOSE, 2 MG/3ML SOPN Inject 0.5 mg into the skin once a week.     prednisoLONE acetate (PRED FORTE) 1 % ophthalmic suspension Place into the right eye.     Probiotic Product (PROBIOTIC PO) Take 1 capsule by mouth 2 (two) times a week.     rosuvastatin (CRESTOR) 40 MG  tablet Take 1 tablet by mouth once daily 90 tablet 3   SYNTHROID 112 MCG tablet Take 1 tablet (112 mcg total) by mouth daily before breakfast. 90 tablet 2   No current facility-administered medications on file prior to visit.        ROS:  All others reviewed and negative.  Objective        PE:  BP 134/76 (BP Location: Right Arm, Patient Position: Sitting, Cuff Size: Normal)   Pulse 70   Temp 99.1 F (37.3 C) (Oral)   Ht 5\' 5"  (1.651 m)   Wt 194 lb (88 kg)   SpO2 98%   BMI 32.28 kg/m  Constitutional: Pt appears in NAD, mild ill, non toxic               HENT: Head: NCAT.                Right Ear: External ear normal.                 Left Ear: External ear normal. Bilat tm's with mild erythema.  Max sinus areas non tender.  Pharynx with mild erythema, no exudate               Eyes: . Pupils are equal, round, and reactive to light. Conjunctivae and EOM are normal               Nose: without d/c or deformity               Neck: Neck supple. Gross normal ROM               Cardiovascular: Normal rate and regular rhythm.                 Pulmonary/Chest: Effort normal and breath sounds without rales or wheezing.                Abd:  Soft, mild ruq tender, ND, + BS, no organomegaly               Neurological: Pt is alert. At baseline orientation, motor grossly intact               Skin: Skin is warm. No rashes, no other new lesions, LE edema - none               Psychiatric: Pt behavior is normal without agitation   Micro: none  Cardiac tracings I have personally interpreted today:  none  Pertinent Radiological findings (summarize): none   Lab Results  Component Value Date   WBC 4.9 09/06/2023   HGB 16.0 (H) 09/06/2023   HCT 49.1 (H) 09/06/2023   PLT 174.0 09/06/2023   GLUCOSE 82 09/06/2023   CHOL 148 03/11/2023   TRIG 81 03/11/2023   HDL 55 03/11/2023   LDLCALC 77 03/11/2023   ALT 11 09/06/2023   AST 16 09/06/2023   NA 140 09/06/2023   K 4.0 09/06/2023   CL  106 09/06/2023   CREATININE 1.23 (H) 09/06/2023   BUN 13 09/06/2023   CO2 26 09/06/2023   TSH 5.58 (H) 09/02/2023   INR 1.3 (H) 08/18/2019   HGBA1C 5.6 09/02/2023   Assessment/Plan:  BRITANI DURBEN is a 77 y.o. Black or African American [2] female with  has a past medical history of Arthritis, Back pain, CAD (coronary artery disease), Cancer (HCC), Cervical cancer (HCC) (1977), Chest pain, Chronic kidney disease, Constipation, Family history of anesthesia complication, GERD (gastroesophageal reflux disease), Gout (10/08/2016), History of heart attack, Hyperlipidemia, Hypertensive heart disease, Hypothyroidism, Joint pain, Nonischemic cardiomyopathy (HCC), PAF (paroxysmal atrial fibrillation) (HCC), Pre-diabetes, Sleep apnea, SOB (shortness of breath), and Vitamin D deficiency.  CRI (chronic renal insufficiency), stage 2 (mild) Lab Results  Component Value Date   CREATININE 1.23 (H) 09/06/2023   Stable overall, cont to avoid nephrotoxins   Essential hypertension BP Readings from Last 3 Encounters:  09/06/23 134/76  09/02/23 120/84  08/31/23 (!) 143/78   Stable, pt to continue holding norvasc 5 mg every day, benicar 40 every day and coreg 12.5 bid as BP have been low at home per pt   Fever and chills Mild  to mod,appears mild ill today, etiology unclear, for cxr, Urine cx, and for antibx course augmentin bid,  to f/u any worsening symptoms or concerns   RUQ pain Mild on palpation today - for LFTs with labs, consider imaging,  to f/u any worsening symptoms or concerns  Vitamin D deficiency Last vitamin D Lab Results  Component Value Date   VD25OH 45.79 09/02/2023   Stable, cont oral replacement  Followup: Return if symptoms worsen or fail to improve.  Oliver Barre, MD 09/08/2023 3:34 PM [facility]

## 2023-09-06 NOTE — Patient Instructions (Addendum)
Please take all new medication as prescribed  - the antibiotic  Please continue all other medications as before, and refills have been done if requested - the carvedilol  Please have the pharmacy call with any other refills you may need.  Please keep your appointments with your specialists as you may have planned  Please go to the XRAY Department in the first floor for the x-ray testing  Please go to the LAB at the blood drawing area for the tests to be done  You will be contacted by phone if any changes need to be made immediately.  Otherwise, you will receive a letter about your results with an explanation, but please check with MyChart first.

## 2023-09-07 LAB — URINE CULTURE: Result:: NO GROWTH

## 2023-09-08 ENCOUNTER — Encounter: Payer: Self-pay | Admitting: Internal Medicine

## 2023-09-08 DIAGNOSIS — R509 Fever, unspecified: Secondary | ICD-10-CM | POA: Insufficient documentation

## 2023-09-08 DIAGNOSIS — R1011 Right upper quadrant pain: Secondary | ICD-10-CM | POA: Insufficient documentation

## 2023-09-08 NOTE — Assessment & Plan Note (Signed)
Lab Results  Component Value Date   CREATININE 1.23 (H) 09/06/2023   Stable overall, cont to avoid nephrotoxins

## 2023-09-08 NOTE — Assessment & Plan Note (Signed)
BP Readings from Last 3 Encounters:  09/06/23 134/76  09/02/23 120/84  08/31/23 (!) 143/78   Stable, pt to continue holding norvasc 5 mg every day, benicar 40 every day and coreg 12.5 bid as BP have been low at home per pt

## 2023-09-08 NOTE — Assessment & Plan Note (Signed)
Mild on palpation today - for LFTs with labs, consider imaging,  to f/u any worsening symptoms or concerns

## 2023-09-08 NOTE — Assessment & Plan Note (Signed)
Last vitamin D Lab Results  Component Value Date   VD25OH 45.79 09/02/2023   Stable, cont oral replacement

## 2023-09-08 NOTE — Assessment & Plan Note (Signed)
Mild to mod,appears mild ill today, etiology unclear, for cxr, Urine cx, and for antibx course augmentin bid,  to f/u any worsening symptoms or concerns

## 2023-09-12 NOTE — Progress Notes (Deleted)
Electrophysiology Office Note:   Date:  09/12/2023  ID:  Claudia Cantrell, DOB [birth year], MRN [id number]  Primary Cardiologist: Raelene Trew Jorja Loa, MD Electrophysiologist: None  {Click to update primary MD,subspecialty MD or APP then REFRESH:1}    History of Present Illness:   Claudia Cantrell is a 77 y.o. female with h/o coronary disease, CKD, hypertension, hyperlipidemia, nonischemic cardiomyopathy, atrial fibrillation post ablation 07/28/2021 with repeat ablation 12/14/2021 seen today for routine electrophysiology followup.   Since last being seen in our clinic the patient reports doing ***.  she denies chest pain, palpitations, dyspnea, PND, orthopnea, nausea, vomiting, dizziness, syncope, edema, weight gain, or early satiety.   Review of systems complete and found to be negative unless listed in HPI.   EP Information / Studies Reviewed:    {EKGtoday:28818}        Risk Assessment/Calculations:    CHA2DS2-VASc Score = 6  {Confirm score is correct.  If not, click here to update score.  REFRESH note.  :1} This indicates a 9.7% annual risk of stroke. The patient's score is based upon: CHF History: 1 HTN History: 1 Diabetes History: 1 Stroke History: 0 Vascular Disease History: 1 Age Score: 1 Gender Score: 1   {This patient has a significant risk of stroke if diagnosed with atrial fibrillation.  Please consider VKA or DOAC agent for anticoagulation if the bleeding risk is acceptable.   You can also use the SmartPhrase .[email or web address] for documentation.   :[id number]} No BP recorded.  {Refresh Note OR Click here to enter BP  :1}***        Physical Exam:   VS:  There were no vitals taken for this visit.   Wt Readings from Last 3 Encounters:  09/06/23 194 lb (88 kg)  09/02/23 196 lb (88.9 kg)  07/08/23 194 lb (88 kg)     GEN: Well nourished, well developed in no acute distress NECK: No JVD; No carotid bruits CARDIAC: {EPRHYTHM:28826}, no murmurs, rubs, gallops RESPIRATORY:  Clear  to auscultation without rales, wheezing or rhonchi  ABDOMEN: Soft, non-tender, non-distended EXTREMITIES:  No edema; No deformity   ASSESSMENT AND PLAN:    1.  Persistent atrial fibrillation: Post ablation 07/28/2021 with repeat ablation 04/09/2022.  Amiodarone has been stopped.***  2.  Second hypercoagulable state: Currently on Eliquis  3.  Coronary artery disease: No current chest pain  4.  Obstructive sleep apnea: CPAP compliance encouraged  Follow up with {EPPIR:51884} {EPFOLLOW ZY:60630}  Signed, Saron Tweed Jorja Loa, MD

## 2023-09-13 ENCOUNTER — Other Ambulatory Visit: Payer: Self-pay | Admitting: Family Medicine

## 2023-09-13 ENCOUNTER — Ambulatory Visit: Payer: Medicare PPO | Attending: Cardiology | Admitting: Cardiology

## 2023-09-13 DIAGNOSIS — Z1212 Encounter for screening for malignant neoplasm of rectum: Secondary | ICD-10-CM

## 2023-09-13 DIAGNOSIS — Z1211 Encounter for screening for malignant neoplasm of colon: Secondary | ICD-10-CM

## 2023-09-16 ENCOUNTER — Encounter: Payer: Self-pay | Admitting: Cardiology

## 2023-09-18 ENCOUNTER — Encounter: Payer: Self-pay | Admitting: Cardiology

## 2023-09-18 ENCOUNTER — Ambulatory Visit: Payer: Medicare PPO | Attending: Cardiology | Admitting: Cardiology

## 2023-09-18 VITALS — BP 112/66 | HR 53 | Ht 65.0 in | Wt 192.0 lb

## 2023-09-18 DIAGNOSIS — I10 Essential (primary) hypertension: Secondary | ICD-10-CM

## 2023-09-18 DIAGNOSIS — D6869 Other thrombophilia: Secondary | ICD-10-CM | POA: Diagnosis not present

## 2023-09-18 DIAGNOSIS — I4819 Other persistent atrial fibrillation: Secondary | ICD-10-CM | POA: Diagnosis not present

## 2023-09-18 DIAGNOSIS — G4733 Obstructive sleep apnea (adult) (pediatric): Secondary | ICD-10-CM

## 2023-09-18 NOTE — Progress Notes (Signed)
Electrophysiology Office Note:   Date:  09/18/2023  ID:  Claudia Cantrell, DOB Dec 01, 1946, MRN [id number]  Primary Cardiologist: None Electrophysiologist: Mamta Rimmer Jorja Loa, MD      History of Present Illness:   Claudia Cantrell is a 77 y.o. female with h/o coronary artery disease, CKD, hypertension, hyperlipidemia, nonischemic cardiomyopathy, atrial fibrillation post ablation 07/28/2021 with repeat ablation 12/14/2021 seen today for routine electrophysiology followup.   Since last being seen in our clinic the patient reports doing overall well from a cardiac perspective.  She has had no further episodes of atrial fibrillation.  She is quite happy with her control.  She has no chest pain or shortness of breath.  She has noted over the last few weeks chills and a runny nose.  She has been followed by her primary physician.  She has plans for colonoscopy on Friday.  she denies chest pain, palpitations, dyspnea, PND, orthopnea, nausea, vomiting, dizziness, syncope, edema, weight gain, or early satiety.   Review of systems complete and found to be negative unless listed in HPI.   EP Information / Studies Reviewed:    EKG is ordered today. Personal review as below.  EKG Interpretation Date/Time:  Wednesday September 18 2023 11:52:36 EDT Ventricular Rate:  53 PR Interval:  172 QRS Duration:  96 QT Interval:  436 QTC Calculation: 409 R Axis:   28  Text Interpretation: Sinus bradycardia Nonspecific T wave abnormality When compared with ECG of 16-Jan-2023 15:04, Criteria for Septal infarct are no longer Present Nonspecific T wave abnormality, worse in Lateral leads Confirmed by Yamile Roedl ([id number]) on 09/18/2023 12:03:17 PM     Risk Assessment/Calculations:    CHA2DS2-VASc Score = 6   This indicates a 9.7% annual risk of stroke. The patient's score is based upon: CHF History: 1 HTN History: 1 Diabetes History: 1 Stroke History: 0 Vascular Disease History: 1 Age Score: 1 Gender Score: 1              Physical Exam:   VS:  BP 112/66   Pulse (!) 53   Ht 5\' 5"  (1.651 m)   Wt 192 lb (87.1 kg)   SpO2 97%   BMI 31.95 kg/m    Wt Readings from Last 3 Encounters:  09/18/23 192 lb (87.1 kg)  09/06/23 194 lb (88 kg)  09/02/23 196 lb (88.9 kg)     GEN: Well nourished, well developed in no acute distress NECK: No JVD; No carotid bruits CARDIAC: Regular rate and rhythm, no murmurs, rubs, gallops RESPIRATORY:  Clear to auscultation without rales, wheezing or rhonchi  ABDOMEN: Soft, non-tender, non-distended EXTREMITIES:  No edema; No deformity   ASSESSMENT AND PLAN:    1.  Persistent atrial fibrillation: Post ablation 07/28/2021 with repeat ablation 12/14/2021.  Amiodarone has been stopped.  She remains in normal rhythm.  Laylah Riga continue with current management.  2.  Coronary artery disease: No chest pain.  Plan per primary cardiology  3.  Obstructive sleep apnea: CPAP compliance encouraged  4.  Secondary hypercoagulable state: Currently on Eliquis for atrial fibrillation  Follow up with EP APP in 12 months  Signed, Addaline Peplinski Jorja Loa, MD

## 2023-09-20 ENCOUNTER — Ambulatory Visit: Payer: Medicare PPO | Admitting: Gastroenterology

## 2023-09-20 ENCOUNTER — Encounter: Payer: Self-pay | Admitting: Gastroenterology

## 2023-09-20 VITALS — BP 151/76 | HR 67 | Temp 97.3°F | Resp 12 | Ht 65.0 in | Wt 193.0 lb

## 2023-09-20 DIAGNOSIS — Z1211 Encounter for screening for malignant neoplasm of colon: Secondary | ICD-10-CM | POA: Diagnosis not present

## 2023-09-20 DIAGNOSIS — G4730 Sleep apnea, unspecified: Secondary | ICD-10-CM | POA: Diagnosis not present

## 2023-09-20 DIAGNOSIS — D125 Benign neoplasm of sigmoid colon: Secondary | ICD-10-CM

## 2023-09-20 DIAGNOSIS — R7303 Prediabetes: Secondary | ICD-10-CM | POA: Diagnosis not present

## 2023-09-20 DIAGNOSIS — I10 Essential (primary) hypertension: Secondary | ICD-10-CM | POA: Diagnosis not present

## 2023-09-20 DIAGNOSIS — D122 Benign neoplasm of ascending colon: Secondary | ICD-10-CM | POA: Diagnosis not present

## 2023-09-20 DIAGNOSIS — G4733 Obstructive sleep apnea (adult) (pediatric): Secondary | ICD-10-CM | POA: Diagnosis not present

## 2023-09-20 DIAGNOSIS — E039 Hypothyroidism, unspecified: Secondary | ICD-10-CM | POA: Diagnosis not present

## 2023-09-20 MED ORDER — SODIUM CHLORIDE 0.9 % IV SOLN: 500.0000 mL | Freq: Once | INTRAVENOUS | Status: DC

## 2023-09-20 NOTE — Op Note (Signed)
[facility] Patient Name: Claudia Cantrell Procedure Date: 09/20/2023 2:26 PM MRN: [id number] Endoscopist: Corliss Parish , MD, [id number] Age: 77 Referring MD:  Date of Birth: [birth year] Gender: Female Account #: [id number] Procedure:                Colonoscopy Indications:              Screening for colorectal malignant neoplasm, This                            is the patient's first colonoscopy Medicines:                Monitored Anesthesia Care Procedure:                Pre-Anesthesia Assessment:                           - Prior to the procedure, a History and Physical                            was performed, and patient medications and                            allergies were reviewed. The patient's tolerance of                            previous anesthesia was also reviewed. The risks                            and benefits of the procedure and the sedation                            options and risks were discussed with the patient.                            All questions were answered, and informed consent                            was obtained. Prior Anticoagulants: The patient has                            taken no anticoagulant or antiplatelet agents. ASA                            Grade Assessment: III - A patient with severe                            systemic disease. After reviewing the risks and                            benefits, the patient was deemed in satisfactory                            condition to undergo the procedure.  After obtaining informed consent, the colonoscope                            was passed under direct vision. Throughout the                            procedure, the patient's blood pressure, pulse, and                            oxygen saturations were monitored continuously. The                            CF [id number] #[id number] was introduced through the anus                            and advanced to  the the cecum, identified by                            appendiceal orifice and ileocecal valve. The                            colonoscopy was somewhat difficult due to poor                            endoscopic visualization and significant looping.                            Successful completion of the procedure was aided by                            changing the patient's position, using manual                            pressure, straightening and shortening the scope to                            obtain bowel loop reduction, using scope torsion                            and lavage. The patient tolerated the procedure.                            The quality of the bowel preparation was adequate.                            The ileocecal valve, appendiceal orifice, and                            rectum were photographed. Scope In: 2:41:17 PM Scope Out: 3:05:18 PM Scope Withdrawal Time: 0 hours 17 minutes 39 seconds  Total Procedure Duration: 0 hours 24 minutes 1 second  Findings:                 The digital rectal exam findings include  hemorrhoids. Pertinent negatives include no                            palpable rectal lesions.                           Extensive amounts of semi-liquid stool was found in                            the entire colon, interfering with visualization.                            Lavage of the area was performed using copious                            amounts, resulting in clearance with adequate                            visualization.                           The colon (entire examined portion) revealed                            moderately excessive looping.                           Two sessile polyps were found in the sigmoid colon                            and ascending colon. The polyps were 3 to 5 mm in                            size. These polyps were removed with a cold snare.                             Resection and retrieval were complete.                           Many small-mouthed diverticula were found in the                            recto-sigmoid colon and sigmoid colon.                           Non-bleeding non-thrombosed external and internal                            hemorrhoids were found during retroflexion, during                            perianal exam and during digital exam. The                            hemorrhoids were Grade II (internal hemorrhoids  that prolapse but reduce spontaneously). Complications:            No immediate complications. Estimated Blood Loss:     Estimated blood loss was minimal. Impression:               - Hemorrhoids found on digital rectal exam.                           - Stool in the entire examined colon -lavaged                            copiously for adequate visualization.                           - There was significant looping of the colon.                           - Two 3 to 5 mm polyps in the sigmoid colon and in                            the ascending colon, removed with a cold snare.                            Resected and retrieved.                           - Diverticulosis in the recto-sigmoid colon and in                            the sigmoid colon.                           - Non-bleeding non-thrombosed external and internal                            hemorrhoids. Recommendation:           - The patient will be observed post-procedure,                            until all discharge criteria are met.                           - Discharge patient to home.                           - Patient has a contact number available for                            emergencies. The signs and symptoms of potential                            delayed complications were discussed with the                            patient. Return to normal activities tomorrow.  Written discharge  instructions were provided to the                            patient.                           - High fiber diet.                           - Use FiberCon 1-2 tablets PO daily.                           - May restart Eliquis in 48 hours (10/13 PM) to                            decrease risk of post interventional bleeding risk.                           - Continue present medications.                           - Await pathology results.                           - Repeat colonoscopy in 5-10 years for surveillance                            based on pathology results and based on patient's                            age and medical comorbidities at the time (2-day                            preparation will be required and working on her                            likely chronic constipation).                           - The findings and recommendations were discussed                            with the patient. Corliss Parish, MD 09/20/2023 3:11:06 PM

## 2023-09-20 NOTE — Progress Notes (Signed)
Called to room to assist during endoscopic procedure.  Patient ID and intended procedure confirmed with present staff. Received instructions for my participation in the procedure from the performing physician.  

## 2023-09-20 NOTE — Progress Notes (Signed)
Vss nad trans to pacu 

## 2023-09-20 NOTE — Progress Notes (Signed)
GASTROENTEROLOGY PROCEDURE H&P NOTE   Primary Care Physician: Avanell Shackleton, NP-C  HPI: Claudia Cantrell is a 77 y.o. female who presents for colonoscopy for screening.  Past Medical History:  Diagnosis Date   Arthritis    rheumatoid   Back pain    CAD (coronary artery disease)    a. 1992 s/p MI and PTCA of unknown vessel;  b. 09/2010 Cath: LM nl, LAD 20p, LCX 68m, RCA 67m, RPL 20.   Cancer [facility])    Cervical cancer (HCC) 1977   Chest pain    Chronic kidney disease    Constipation    Family history of anesthesia complication    daughter has difficulty waking    GERD (gastroesophageal reflux disease)    occ   Gout 10/08/2016   History of heart attack    Hyperlipidemia    Hypertensive heart disease    Hypothyroidism    Joint pain    Nonischemic cardiomyopathy (HCC)    a. 07/2016 Echo: EF 40-45%, mild LVH, inferior akinesis, moderately dilated left atrium, trivial AI and MR.   PAF (paroxysmal atrial fibrillation) (HCC)    a. 07/2016 Admitted w/ AF RVR-->CHA2DS2VASc = 5-->Eliquis;  b. 07/2016 successful TEE/DCCV.   Pre-diabetes    Sleep apnea    a. Using CPAP.   SOB (shortness of breath)    Vitamin D deficiency    Past Surgical History:  Procedure Laterality Date   ABDOMINAL HYSTERECTOMY  1988   ATRIAL FIBRILLATION ABLATION N/A 07/28/2021   Procedure: ATRIAL FIBRILLATION ABLATION;  Surgeon: Regan Lemming, MD;  Location: [facility]AS[facility];  Service: Cardiovascular;  Laterality: N/A;   ATRIAL FIBRILLATION ABLATION N/A 12/14/2022   Procedure: ATRIAL FIBRILLATION ABLATION;  Surgeon: Regan Lemming, MD;  Location: [facility] CV [facility];  Service: Cardiovascular;  Laterality: N/A;   BACK SURGERY  1994   BIOPSY  09/02/2019   Procedure: BIOPSY;  Surgeon: Meridee Score Netty Starring., MD;  Location: [facility];  Service: Gastroenterology;;   BIOPSY  12/09/2019   Procedure: BIOPSY;  Surgeon: Lemar Lofty., MD;  Location: [facility];  Service: Gastroenterology;;    BIOPSY  03/08/2021   Procedure: BIOPSY;  Surgeon: Lemar Lofty., MD;  Location: [facility];  Service: Gastroenterology;;   BIOPSY  07/08/2023   Procedure: BIOPSY;  Surgeon: Lemar Lofty., MD;  Location: [facility];  Service: Gastroenterology;;   CARDIAC CATHETERIZATION  1991 AND 1992   PTCA BY DR Meryl Crutch   CARDIOVERSION N/A 08/01/2016   Procedure: CARDIOVERSION;  Surgeon: Lewayne Bunting, MD;  Location: [facility] ENDOSCOPY;  Service: Cardiovascular;  Laterality: N/A;   CARDIOVERSION N/A 01/11/2020   Procedure: CARDIOVERSION;  Surgeon: Wendall Stade, MD;  Location: [facility] ENDOSCOPY;  Service: Cardiovascular;  Laterality: N/A;   CARDIOVERSION N/A 07/08/2020   Procedure: CARDIOVERSION;  Surgeon: Little Ishikawa, MD;  Location: [facility] ENDOSCOPY;  Service: Cardiovascular;  Laterality: N/A;   CARDIOVERSION N/A 05/31/2022   Procedure: CARDIOVERSION;  Surgeon: Chrystie Nose, MD;  Location: [facility] ENDOSCOPY;  Service: Cardiovascular;  Laterality: N/A;   CARDIOVERSION N/A 07/18/2022   Procedure: CARDIOVERSION;  Surgeon: Little Ishikawa, MD;  Location: [facility] ENDOSCOPY;  Service: Cardiovascular;  Laterality: N/A;   ENDOSCOPIC MUCOSAL RESECTION N/A 12/09/2019   Procedure: ENDOSCOPIC MUCOSAL RESECTION;  Surgeon: Lemar Lofty., MD;  Location: [facility] ENDOSCOPY;  Service: Gastroenterology;  Laterality: N/A;   ESOPHAGOGASTRODUODENOSCOPY N/A 03/08/2021   Procedure: ESOPHAGOGASTRODUODENOSCOPY (EGD);  Surgeon: Meridee Score Netty Starring., MD;  Location: [facility];  Service:  Gastroenterology;  Laterality: N/A;   ESOPHAGOGASTRODUODENOSCOPY (EGD) WITH PROPOFOL N/A 09/02/2019   Procedure: ESOPHAGOGASTRODUODENOSCOPY (EGD) WITH PROPOFOL;  Surgeon: Meridee Score Netty Starring., MD;  Location: [facility] ENDOSCOPY;  Service: Gastroenterology;  Laterality: N/A;   ESOPHAGOGASTRODUODENOSCOPY (EGD) WITH PROPOFOL N/A 12/09/2019   Procedure: ESOPHAGOGASTRODUODENOSCOPY (EGD) WITH PROPOFOL;  Surgeon: Meridee Score  Netty Starring., MD;  Location: [facility] ENDOSCOPY;  Service: Gastroenterology;  Laterality: N/A;   ESOPHAGOGASTRODUODENOSCOPY (EGD) WITH PROPOFOL N/A 07/08/2023   Procedure: ESOPHAGOGASTRODUODENOSCOPY (EGD) WITH PROPOFOL;  Surgeon: Meridee Score Netty Starring., MD;  Location: [facility] ENDOSCOPY;  Service: Gastroenterology;  Laterality: N/A;   EUS N/A 09/02/2019   Procedure: UPPER ENDOSCOPIC ULTRASOUND (EUS) RADIAL;  Surgeon: Lemar Lofty., MD;  Location: [facility] ENDOSCOPY;  Service: Gastroenterology;  Laterality: N/A;  EUS radial/linear   EUS N/A 12/09/2019   Procedure: UPPER ENDOSCOPIC ULTRASOUND (EUS) RADIAL;  Surgeon: Lemar Lofty., MD;  Location: [facility] ENDOSCOPY;  Service: Gastroenterology;  Laterality: N/A;   EUS  12/09/2019   Procedure: UPPER ENDOSCOPIC ULTRASOUND (EUS) LINEAR;  Surgeon: Lemar Lofty., MD;  Location: [facility];  Service: Gastroenterology;;   EUS N/A 03/08/2021   Procedure: UPPER ENDOSCOPIC ULTRASOUND (EUS) RADIAL;  Surgeon: Lemar Lofty., MD;  Location: [facility];  Service: Gastroenterology;  Laterality: N/A;   FINE NEEDLE ASPIRATION  09/02/2019   Procedure: FINE NEEDLE ASPIRATION (FNA) LINEAR;  Surgeon: Lemar Lofty., MD;  Location: [facility];  Service: Gastroenterology;;   FINE NEEDLE ASPIRATION  12/09/2019   Procedure: FINE NEEDLE ASPIRATION (FNA) LINEAR;  Surgeon: Lemar Lofty., MD;  Location: [facility];  Service: Gastroenterology;;   HEMOSTASIS CLIP PLACEMENT  12/09/2019   Procedure: HEMOSTASIS CLIP PLACEMENT;  Surgeon: Lemar Lofty., MD;  Location: [facility];  Service: Gastroenterology;;   ORIF ANKLE FRACTURE Right 04/27/2015   Procedure: OPEN REDUCTION INTERNAL FIXATION (ORIF) RIGHT BIMALLEOLAR ANKLE FRACTURE WITH SYNDESMOSIS FIXATION;  Surgeon: Tarry Kos, MD;  Location: [facility] OR;  Service: Orthopedics;  Laterality: Right;   SUBMUCOSAL LIFTING INJECTION  12/09/2019   Procedure: SUBMUCOSAL LIFTING INJECTION;   Surgeon: Lemar Lofty., MD;  Location: [facility] ENDOSCOPY;  Service: Gastroenterology;;   TEE WITHOUT CARDIOVERSION N/A 08/01/2016   Procedure: TRANSESOPHAGEAL ECHOCARDIOGRAM (TEE);  Surgeon: Lewayne Bunting, MD;  Location: [facility] ENDOSCOPY;  Service: Cardiovascular;  Laterality: N/A;   THYROIDECTOMY  11/24/2013   DR Derrell Lolling   THYROIDECTOMY N/A 11/24/2013   Procedure: TOTAL THYROIDECTOMY;  Surgeon: Ernestene Mention, MD;  Location: [facility] OR;  Service: General;  Laterality: N/A;   TRANSTHORACIC ECHOCARDIOGRAM  01/15/2013   EF 55% TO 65%. PROBABLE MILD HYPOKINESIS OF THE INFERIOR MYOCARDIUM. GRADE 1 DIASTOLIC DYSFUNCTION. TRIAL AR.LA IS MILDLY DILATED.   UPPER ESOPHAGEAL ENDOSCOPIC ULTRASOUND (EUS) N/A 07/08/2023   Procedure: UPPER ESOPHAGEAL ENDOSCOPIC ULTRASOUND (EUS);  Surgeon: Lemar Lofty., MD;  Location: [facility];  Service: Gastroenterology;  Laterality: N/A;   Current Outpatient Medications  Medication Sig Dispense Refill   acetaminophen (TYLENOL) 500 MG tablet Take 1,000 mg by mouth every 6 (six) hours as needed for moderate pain.     allopurinol (ZYLOPRIM) 100 MG tablet Take 1 tablet (100 mg total) by mouth daily. 30 tablet 2   amLODipine (NORVASC) 5 MG tablet TAKE 1 & 1/2 (ONE & ONE-HALF) TABLETS BY MOUTH IN THE EVENING 135 tablet 0   apixaban (ELIQUIS) 5 MG TABS tablet Take 1 tablet (5 mg total) by mouth 2 (two) times daily.     carvedilol (COREG) 12.5 MG tablet Take 1 tablet (12.5 mg total) by mouth 2 (two)  times daily with a meal. 180 tablet 0   cyanocobalamin (VITAMIN B12) 1000 MCG tablet Take 1,000 mcg by mouth daily.     dapagliflozin propanediol (FARXIGA) 10 MG TABS tablet Take 10 mg by mouth in the morning.     furosemide (LASIX) 20 MG tablet Take 20 mg by mouth daily as needed for edema.     isosorbide dinitrate (ISORDIL) 5 MG tablet Take 1 tablet by mouth twice daily 180 tablet 0   Multiple Vitamins-Minerals (OCUVITE ADULT 50+ PO) Take 1 tablet by mouth daily.     Na  Sulfate-K Sulfate-Mg Sulf (SUPREP BOWEL PREP KIT) 17.5-3.13-1.6 GM/177ML SOLN Take 1 kit by mouth as directed. For colonoscopy prep 354 mL 0   olmesartan (BENICAR) 40 MG tablet Take 40 mg by mouth daily.     OZEMPIC, 0.25 OR 0.5 MG/DOSE, 2 MG/3ML SOPN Inject 0.5 mg into the skin once a week.     Probiotic Product (PROBIOTIC PO) Take 1 capsule by mouth 2 (two) times a week.     rosuvastatin (CRESTOR) 40 MG tablet Take 1 tablet by mouth once daily 90 tablet 3   SYNTHROID 112 MCG tablet Take 1 tablet (112 mcg total) by mouth daily before breakfast. 90 tablet 2   No current facility-administered medications for this visit.    Current Outpatient Medications:    acetaminophen (TYLENOL) 500 MG tablet, Take 1,000 mg by mouth every 6 (six) hours as needed for moderate pain., Disp: , Rfl:    allopurinol (ZYLOPRIM) 100 MG tablet, Take 1 tablet (100 mg total) by mouth daily., Disp: 30 tablet, Rfl: 2   amLODipine (NORVASC) 5 MG tablet, TAKE 1 & 1/2 (ONE & ONE-HALF) TABLETS BY MOUTH IN THE EVENING, Disp: 135 tablet, Rfl: 0   apixaban (ELIQUIS) 5 MG TABS tablet, Take 1 tablet (5 mg total) by mouth 2 (two) times daily., Disp: , Rfl:    carvedilol (COREG) 12.5 MG tablet, Take 1 tablet (12.5 mg total) by mouth 2 (two) times daily with a meal., Disp: 180 tablet, Rfl: 0   cyanocobalamin (VITAMIN B12) 1000 MCG tablet, Take 1,000 mcg by mouth daily., Disp: , Rfl:    dapagliflozin propanediol (FARXIGA) 10 MG TABS tablet, Take 10 mg by mouth in the morning., Disp: , Rfl:    furosemide (LASIX) 20 MG tablet, Take 20 mg by mouth daily as needed for edema., Disp: , Rfl:    isosorbide dinitrate (ISORDIL) 5 MG tablet, Take 1 tablet by mouth twice daily, Disp: 180 tablet, Rfl: 0   Multiple Vitamins-Minerals (OCUVITE ADULT 50+ PO), Take 1 tablet by mouth daily., Disp: , Rfl:    Na Sulfate-K Sulfate-Mg Sulf (SUPREP BOWEL PREP KIT) 17.5-3.13-1.6 GM/177ML SOLN, Take 1 kit by mouth as directed. For colonoscopy prep, Disp: 354 mL,  Rfl: 0   olmesartan (BENICAR) 40 MG tablet, Take 40 mg by mouth daily., Disp: , Rfl:    OZEMPIC, 0.25 OR 0.5 MG/DOSE, 2 MG/3ML SOPN, Inject 0.5 mg into the skin once a week., Disp: , Rfl:    Probiotic Product (PROBIOTIC PO), Take 1 capsule by mouth 2 (two) times a week., Disp: , Rfl:    rosuvastatin (CRESTOR) 40 MG tablet, Take 1 tablet by mouth once daily, Disp: 90 tablet, Rfl: 3   SYNTHROID 112 MCG tablet, Take 1 tablet (112 mcg total) by mouth daily before breakfast., Disp: 90 tablet, Rfl: 2 Allergies  Allergen Reactions   Labetalol     headaches   Codeine Anxiety   Family History  Problem Relation Age of Onset   Heart disease Mother    Sudden death Mother    Diabetes Father    Stroke Father    Bone cancer Sister    Sudden death Brother    Melanoma Brother    Heart disease Brother    Colon cancer Neg Hx    Esophageal cancer Neg Hx    Inflammatory bowel disease Neg Hx    Liver disease Neg Hx    Pancreatic cancer Neg Hx    Rectal cancer Neg Hx    Stomach cancer Neg Hx    Social History   Socioeconomic History   Marital status: Widowed    Spouse name: Not on file   Number of children: 3   Years of education: Not on file   Highest education level: Not on file  Occupational History   Occupation: retired  Tobacco Use   Smoking status: Former    Current packs/day: 0.00    Average packs/day: 1 pack/day for 15.0 years (15.0 ttl pk-yrs)    Types: Cigarettes    Start date: 12/11/1975    Quit date: 12/10/1990    Years since quitting: 32.8    Passive exposure: Never   Smokeless tobacco: Never   Tobacco comments:    Former smoker 05/21/22  Vaping Use   Vaping status: Never Used  Substance and Sexual Activity   Alcohol use: Yes    Comment: very rare   Drug use: No   Sexual activity: Not Currently  Other Topics Concern   Not on file  Social History Narrative   Right handed    Living  alone.   Social Determinants of Health   Financial Resource Strain: Low Risk   (06/21/2023)   Overall Financial Resource Strain (CARDIA)    Difficulty of Paying Living Expenses: Not very hard  Food Insecurity: No Food Insecurity (06/21/2023)   Hunger Vital Sign    Worried About Running Out of Food in the Last Year: Never true    Ran Out of Food in the Last Year: Never true  Transportation Needs: No Transportation Needs (06/21/2023)   PRAPARE - Administrator, Civil Service (Medical): No    Lack of Transportation (Non-Medical): No  Physical Activity: Insufficiently Active (06/21/2023)   Exercise Vital Sign    Days of Exercise per Week: 5 days    Minutes of Exercise per Session: 20 min  Stress: No Stress Concern Present (06/21/2023)   [employer] of Occupational Health - Occupational Stress Questionnaire    Feeling of Stress : Not at all  Social Connections: Socially Isolated (06/21/2023)   Social Connection and Isolation Panel [NHANES]    Frequency of Communication with Friends and Family: Twice a week    Frequency of Social Gatherings with Friends and Family: Twice a week    Attends Religious Services: Never    Database administrator or Organizations: No    Attends Banker Meetings: Never    Marital Status: Widowed  Intimate Partner Violence: Not At Risk (06/21/2023)   Humiliation, Afraid, Rape, and Kick questionnaire    Fear of Current or Ex-Partner: No    Emotionally Abused: No    Physically Abused: No    Sexually Abused: No    Physical Exam: There were no vitals filed for this visit. There is no height or weight on file to calculate BMI. GEN: NAD EYE: Sclerae anicteric ENT: MMM CV: Non-tachycardic GI: Soft, NT/ND NEURO:  Alert & Oriented x  3  Lab Results: No results for input(s): "WBC", "HGB", "HCT", "PLT" in the last 72 hours. BMET No results for input(s): "NA", "K", "CL", "CO2", "GLUCOSE", "BUN", "CREATININE", "CALCIUM" in the last 72 hours. LFT No results for input(s): "PROT", "ALBUMIN", "AST", "ALT", "ALKPHOS",  "BILITOT", "BILIDIR", "IBILI" in the last 72 hours. PT/INR No results for input(s): "LABPROT", "INR" in the last 72 hours.   Impression / Plan: This is a 77 y.o.female who presents for colonoscopy for screening.  The risks and benefits of endoscopic evaluation/treatment were discussed with the patient and/or family; these include but are not limited to the risk of perforation, infection, bleeding, missed lesions, lack of diagnosis, severe illness requiring hospitalization, as well as anesthesia and sedation related illnesses.  The patient's history has been reviewed, patient examined, no change in status, and deemed stable for procedure.  The patient and/or family is agreeable to proceed.    Corliss Parish, MD [facility]oscopy Office # [phone]

## 2023-09-20 NOTE — Patient Instructions (Addendum)
Restart Eliquis (Apixaban)  in 48 hours 2023-10-22 pm to decease risk of post interventional bleeding risk  -Handout on polyps, hemorrhoids, diverticulosis, high fiber diet provided -await pathology results -repeat colonoscopy for surveillance recommended. Date to be determined when pathology result become available   -Continue present medications  - High fiber diet. - Use FiberCon 1-2 tablets PO daily. Get over the counter   YOU HAD AN ENDOSCOPIC PROCEDURE TODAY AT THE [facility]:   Refer to the procedure report that was given to you for any specific questions about what was found during the examination.  If the procedure report does not answer your questions, please call your gastroenterologist to clarify.  If you requested that your care partner not be given the details of your procedure findings, then the procedure report has been included in a sealed envelope for you to review at your convenience later.  YOU SHOULD EXPECT: Some feelings of bloating in the abdomen. Passage of more gas than usual.  Walking can help get rid of the air that was put into your GI tract during the procedure and reduce the bloating. If you had a lower endoscopy (such as a colonoscopy or flexible sigmoidoscopy) you may notice spotting of blood in your stool or on the toilet paper. If you underwent a bowel prep for your procedure, you may not have a normal bowel movement for a few days.  Please Note:  You might notice some irritation and congestion in your nose or some drainage.  This is from the oxygen used during your procedure.  There is no need for concern and it should clear up in a day or so.  SYMPTOMS TO REPORT IMMEDIATELY:  Following lower endoscopy (colonoscopy or flexible sigmoidoscopy):  Excessive amounts of blood in the stool  Significant tenderness or worsening of abdominal pains  Swelling of the abdomen that is new, acute  Fever of 100F or higher  For urgent or emergent issues, a  gastroenterologist can be reached at any hour by calling [phone]. Do not use MyChart messaging for urgent concerns.    DIET:  We do recommend a small meal at first, but then you may proceed to your regular diet.  Drink plenty of fluids but you should avoid alcoholic beverages for 24 hours.  ACTIVITY:  You should plan to take it easy for the rest of today and you should NOT DRIVE or use heavy machinery until tomorrow (because of the sedation medicines used during the test).    FOLLOW UP: Our staff will call the number listed on your records the next business day following your procedure.  We will call around 7:15- 8:00 am to check on you and address any questions or concerns that you may have regarding the information given to you following your procedure. If we do not reach you, we will leave a message.     If any biopsies were taken you will be contacted by phone or by letter within the next 1-3 weeks.  Please call us at [phone] if you have not heard about the biopsies in 3 weeks.    SIGNATURES/CONFIDENTIALITY: You and/or your care partner have signed paperwork which will be entered into your electronic medical record.  These signatures attest to the fact that that the information above on your After Visit Summary has been reviewed and is understood.  Full responsibility of the confidentiality of this discharge information lies with you and/or your care-partner.

## 2023-09-23 ENCOUNTER — Telehealth: Payer: Self-pay

## 2023-09-23 NOTE — Telephone Encounter (Signed)
  Follow up Call-     09/20/2023    2:09 PM  Call back number  Post procedure Call Back phone  # [phone]  Permission to leave phone message Yes     Patient questions:  Do you have a fever, pain , or abdominal swelling? No. Pain Score  0 *  Have you tolerated food without any problems? Yes.    Have you been able to return to your normal activities? Yes.    Do you have any questions about your discharge instructions: Diet   No. Medications  No. Follow up visit  No.  Do you have questions or concerns about your Care? No.  Actions: * If pain score is 4 or above: No action needed, pain <4.

## 2023-09-25 LAB — SURGICAL PATHOLOGY

## 2023-09-27 ENCOUNTER — Ambulatory Visit (INDEPENDENT_AMBULATORY_CARE_PROVIDER_SITE_OTHER): Payer: Medicare PPO | Admitting: Family Medicine

## 2023-09-27 ENCOUNTER — Encounter: Payer: Self-pay | Admitting: Family Medicine

## 2023-09-27 ENCOUNTER — Encounter: Payer: Self-pay | Admitting: Gastroenterology

## 2023-09-27 VITALS — BP 124/84 | HR 62 | Temp 97.8°F | Ht 65.0 in | Wt 194.0 lb

## 2023-09-27 DIAGNOSIS — Z7901 Long term (current) use of anticoagulants: Secondary | ICD-10-CM

## 2023-09-27 DIAGNOSIS — E782 Mixed hyperlipidemia: Secondary | ICD-10-CM

## 2023-09-27 DIAGNOSIS — R6883 Chills (without fever): Secondary | ICD-10-CM | POA: Diagnosis not present

## 2023-09-27 DIAGNOSIS — I10 Essential (primary) hypertension: Secondary | ICD-10-CM | POA: Diagnosis not present

## 2023-09-27 DIAGNOSIS — G4733 Obstructive sleep apnea (adult) (pediatric): Secondary | ICD-10-CM

## 2023-09-27 DIAGNOSIS — N898 Other specified noninflammatory disorders of vagina: Secondary | ICD-10-CM

## 2023-09-27 DIAGNOSIS — E039 Hypothyroidism, unspecified: Secondary | ICD-10-CM

## 2023-09-27 LAB — COMPREHENSIVE METABOLIC PANEL
ALT: 15 U/L (ref 0–35)
AST: 20 U/L (ref 0–37)
Albumin: 4.8 g/dL (ref 3.5–5.2)
Alkaline Phosphatase: 92 U/L (ref 39–117)
BUN: 19 mg/dL (ref 6–23)
CO2: 30 meq/L (ref 19–32)
Calcium: 10.4 mg/dL (ref 8.4–10.5)
Chloride: 103 meq/L (ref 96–112)
Creatinine, Ser: 1.07 mg/dL (ref 0.40–1.20)
GFR: 50.91 mL/min — ABNORMAL LOW (ref >60.00)
Glucose, Bld: 83 mg/dL (ref 70–99)
Potassium: 3.9 meq/L (ref 3.5–5.1)
Sodium: 142 meq/L (ref 135–145)
Total Bilirubin: 0.8 mg/dL (ref 0.2–1.2)
Total Protein: 8.3 g/dL (ref 6.0–8.3)

## 2023-09-27 LAB — CBC WITH DIFFERENTIAL/PLATELET
Basophils Absolute: 0 10*3/uL (ref 0.0–0.1)
Basophils Relative: 0.4 % (ref 0.0–3.0)
Eosinophils Absolute: 0.1 10*3/uL (ref 0.0–0.7)
Eosinophils Relative: 2 % (ref 0.0–5.0)
HCT: 49.7 % — ABNORMAL HIGH (ref 36.0–46.0)
Hemoglobin: 15.9 g/dL — ABNORMAL HIGH (ref 12.0–15.0)
Lymphocytes Relative: 33.9 % (ref 12.0–46.0)
Lymphs Abs: 1.2 10*3/uL (ref 0.7–4.0)
MCHC: 31.9 g/dL (ref 30.0–36.0)
MCV: 99.7 fL (ref 78.0–100.0)
Monocytes Absolute: 0.3 10*3/uL (ref 0.1–1.0)
Monocytes Relative: 9.6 % (ref 3.0–12.0)
Neutro Abs: 1.8 10*3/uL (ref 1.4–7.7)
Neutrophils Relative %: 54.1 % (ref 43.0–77.0)
Platelets: 203 10*3/uL (ref 150.0–400.0)
RBC: 4.99 Mil/uL (ref 3.87–5.11)
RDW: 14.5 % (ref 11.5–15.5)
WBC: 3.4 10*3/uL — ABNORMAL LOW (ref 4.0–10.5)

## 2023-09-27 LAB — TSH: TSH: 0.67 u[IU]/mL (ref 0.35–5.50)

## 2023-09-27 MED ORDER — FLUCONAZOLE 150 MG PO TABS
150.0000 mg | ORAL_TABLET | Freq: Once | ORAL | 0 refills | Status: AC
Start: 2023-09-27 — End: 2023-09-27

## 2023-09-27 NOTE — Patient Instructions (Signed)
Please go downstairs for labs and I will be in touch with your results.

## 2023-09-27 NOTE — Progress Notes (Unsigned)
Subjective:     Patient ID: Claudia Cantrell, female    DOB: 01/07/1946, 77 y.o.   MRN: [id number]  Chief Complaint  Patient presents with   Medical Management of Chronic Issues    3 month f/u  Chills for the last 4 weeks    HPI  History of Present Illness          Here for follow up on chronic health conditions. C/o feeling cold for several weeks.   No longer has LE edema. Low sodium diet.   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   Nephrologist   Farxiga and Ozempic.   States she completed a course of Augmentin 3 weeks ago.   She had cataract surgery since our last visit.     Health Maintenance Due  Topic Date Due   DTaP/Tdap/Td (1 - Tdap) Never done   Fecal DNA (Cologuard)  Never done    Past Medical History:  Diagnosis Date   Arthritis    rheumatoid   Back pain    CAD (coronary artery disease)    a. 1992 s/p MI and PTCA of unknown vessel;  b. 09/2010 Cath: LM nl, LAD 20p, LCX 42m, RCA 58m, RPL 20.   Cancer [facility])    Cervical cancer (HCC) 1977   Chest pain    Chronic kidney disease    Constipation    Family history of anesthesia complication    daughter has difficulty waking    GERD (gastroesophageal reflux disease)    occ   Gout 10/08/2016   History of heart attack    Hyperlipidemia    Hypertensive heart disease    Hypothyroidism    Joint pain    Nonischemic cardiomyopathy (HCC)    a. 07/2016 Echo: EF 40-45%, mild LVH, inferior akinesis, moderately dilated left atrium, trivial AI and MR.   PAF (paroxysmal atrial  fibrillation) (HCC)    a. 07/2016 Admitted w/ AF RVR-->CHA2DS2VASc = 5-->Eliquis;  b. 07/2016 successful TEE/DCCV.   Pre-diabetes    Sleep apnea    a. Using CPAP.   SOB (shortness of breath)    Vitamin D deficiency     Past Surgical History:  Procedure Laterality Date   ABDOMINAL HYSTERECTOMY  1988   ATRIAL FIBRILLATION ABLATION N/A 07/28/2021   Procedure: ATRIAL FIBRILLATION ABLATION;  Surgeon: Regan Lemming, MD;  Location: [facility];  Service: Cardiovascular;  Laterality: N/A;   ATRIAL FIBRILLATION ABLATION N/A 12/14/2022   Procedure: ATRIAL FIBRILLATION ABLATION;  Surgeon: Regan Lemming, MD;  Location: [facility];  Service: Cardiovascular;  Laterality: N/A;   BACK SURGERY  1994   BIOPSY  09/02/2019   Procedure: BIOPSY;  Surgeon: Meridee Score Netty Starring., MD;  Location: [facility];  Service: Gastroenterology;;   BIOPSY  12/09/2019   Procedure: BIOPSY;  Surgeon: Lemar Lofty., MD;  Location: [facility];  Service: Gastroenterology;;   BIOPSY  03/08/2021   Procedure: BIOPSY;  Surgeon: Lemar Lofty., MD;  Location: [facility];  Service: Gastroenterology;;   BIOPSY  07/08/2023  Procedure: BIOPSY;  Surgeon: Meridee Score Netty Starring., MD;  Location: [facility];  Service: Gastroenterology;;   CARDIAC CATHETERIZATION  1991 AND 1992   PTCA BY DR Meryl Crutch   CARDIOVERSION N/A 08/01/2016   Procedure: CARDIOVERSION;  Surgeon: Lewayne Bunting, MD;  Location: [facility] ENDOSCOPY;  Service: Cardiovascular;  Laterality: N/A;   CARDIOVERSION N/A 01/11/2020   Procedure: CARDIOVERSION;  Surgeon: Wendall Stade, MD;  Location: [facility] ENDOSCOPY;  Service: Cardiovascular;  Laterality: N/A;   CARDIOVERSION N/A 07/08/2020   Procedure: CARDIOVERSION;  Surgeon: Little Ishikawa, MD;  Location: [facility] ENDOSCOPY;  Service: Cardiovascular;  Laterality: N/A;   CARDIOVERSION N/A 05/31/2022   Procedure: CARDIOVERSION;  Surgeon: Chrystie Nose, MD;  Location: [facility]  ENDOSCOPY;  Service: Cardiovascular;  Laterality: N/A;   CARDIOVERSION N/A 07/18/2022   Procedure: CARDIOVERSION;  Surgeon: Little Ishikawa, MD;  Location: [facility] ENDOSCOPY;  Service: Cardiovascular;  Laterality: N/A;   ENDOSCOPIC MUCOSAL RESECTION N/A 12/09/2019   Procedure: ENDOSCOPIC MUCOSAL RESECTION;  Surgeon: Lemar Lofty., MD;  Location: [facility];  Service: Gastroenterology;  Laterality: N/A;   ESOPHAGOGASTRODUODENOSCOPY N/A 03/08/2021   Procedure: ESOPHAGOGASTRODUODENOSCOPY (EGD);  Surgeon: Lemar Lofty., MD;  Location: [facility];  Service: Gastroenterology;  Laterality: N/A;   ESOPHAGOGASTRODUODENOSCOPY (EGD) WITH PROPOFOL N/A 09/02/2019   Procedure: ESOPHAGOGASTRODUODENOSCOPY (EGD) WITH PROPOFOL;  Surgeon: Meridee Score Netty Starring., MD;  Location: [facility];  Service: Gastroenterology;  Laterality: N/A;   ESOPHAGOGASTRODUODENOSCOPY (EGD) WITH PROPOFOL N/A 12/09/2019   Procedure: ESOPHAGOGASTRODUODENOSCOPY (EGD) WITH PROPOFOL;  Surgeon: Meridee Score Netty Starring., MD;  Location: [facility];  Service: Gastroenterology;  Laterality: N/A;   ESOPHAGOGASTRODUODENOSCOPY (EGD) WITH PROPOFOL N/A 07/08/2023   Procedure: ESOPHAGOGASTRODUODENOSCOPY (EGD) WITH PROPOFOL;  Surgeon: Meridee Score Netty Starring., MD;  Location: [facility];  Service: Gastroenterology;  Laterality: N/A;   EUS N/A 09/02/2019   Procedure: UPPER ENDOSCOPIC ULTRASOUND (EUS) RADIAL;  Surgeon: Lemar Lofty., MD;  Location: [facility];  Service: Gastroenterology;  Laterality: N/A;  EUS radial/linear   EUS N/A 12/09/2019   Procedure: UPPER ENDOSCOPIC ULTRASOUND (EUS) RADIAL;  Surgeon: Lemar Lofty., MD;  Location: [facility];  Service: Gastroenterology;  Laterality: N/A;   EUS  12/09/2019   Procedure: UPPER ENDOSCOPIC ULTRASOUND (EUS) LINEAR;  Surgeon: Lemar Lofty., MD;  Location: [facility];  Service: Gastroenterology;;   EUS N/A 03/08/2021   Procedure: UPPER ENDOSCOPIC  ULTRASOUND (EUS) RADIAL;  Surgeon: Lemar Lofty., MD;  Location: [facility];  Service: Gastroenterology;  Laterality: N/A;   FINE NEEDLE ASPIRATION  09/02/2019   Procedure: FINE NEEDLE ASPIRATION (FNA) LINEAR;  Surgeon: Lemar Lofty., MD;  Location: [facility];  Service: Gastroenterology;;   FINE NEEDLE ASPIRATION  12/09/2019   Procedure: FINE NEEDLE ASPIRATION (FNA) LINEAR;  Surgeon: Lemar Lofty., MD;  Location: [facility];  Service: Gastroenterology;;   HEMOSTASIS CLIP PLACEMENT  12/09/2019   Procedure: HEMOSTASIS CLIP PLACEMENT;  Surgeon: Lemar Lofty., MD;  Location: [facility];  Service: Gastroenterology;;   ORIF ANKLE FRACTURE Right 04/27/2015   Procedure: OPEN REDUCTION INTERNAL FIXATION (ORIF) RIGHT BIMALLEOLAR ANKLE FRACTURE WITH SYNDESMOSIS FIXATION;  Surgeon: Tarry Kos, MD;  Location: [facility] OR;  Service: Orthopedics;  Laterality: Right;   SUBMUCOSAL LIFTING INJECTION  12/09/2019   Procedure: SUBMUCOSAL LIFTING INJECTION;  Surgeon: Lemar Lofty., MD;  Location: [facility];  Service: Gastroenterology;;   TEE WITHOUT CARDIOVERSION N/A 08/01/2016   Procedure: TRANSESOPHAGEAL ECHOCARDIOGRAM (TEE);  Surgeon: Lewayne Bunting, MD;  Location: [facility] ENDOSCOPY;  Service: Cardiovascular;  Laterality: N/A;   THYROIDECTOMY  11/24/2013   DR Derrell Lolling   THYROIDECTOMY N/A 11/24/2013   Procedure: TOTAL THYROIDECTOMY;  Surgeon: Ernestene Mention, MD;  Location: [facility] OR;  Service: General;  Laterality: N/A;   TRANSTHORACIC ECHOCARDIOGRAM  01/15/2013   EF 55% TO 65%. PROBABLE MILD HYPOKINESIS OF THE INFERIOR MYOCARDIUM. GRADE 1 DIASTOLIC DYSFUNCTION. TRIAL AR.LA IS MILDLY DILATED.   UPPER ESOPHAGEAL ENDOSCOPIC ULTRASOUND (EUS) N/A 07/08/2023   Procedure: UPPER ESOPHAGEAL ENDOSCOPIC ULTRASOUND (EUS);  Surgeon: Lemar Lofty., MD;  Location: [facility];  Service: Gastroenterology;  Laterality: N/A;    Family History  Problem Relation Age of  Onset   Heart disease Mother    Sudden death Mother    Diabetes Father    Stroke Father    Bone cancer Sister    Sudden death Brother    Melanoma Brother    Heart disease Brother    Colon cancer Neg Hx    Esophageal cancer Neg Hx    Inflammatory bowel disease Neg Hx    Liver disease Neg Hx    Pancreatic cancer Neg Hx    Rectal cancer Neg Hx    Stomach cancer Neg Hx     Social History   Socioeconomic History   Marital status: Widowed    Spouse name: Not on file   Number of children: 3   Years of education: Not on file   Highest education level: Not on file  Occupational History   Occupation: retired  Tobacco Use   Smoking status: Former    Current packs/day: 0.00    Average packs/day: 1 pack/day for 15.0 years (15.0 ttl pk-yrs)    Types: Cigarettes    Start date: 12/11/1975    Quit date: 12/10/1990    Years since quitting: 32.8    Passive exposure: Never   Smokeless tobacco: Never   Tobacco comments:    Former smoker 05/21/22  Vaping Use   Vaping status: Never Used  Substance and Sexual Activity   Alcohol use: Yes    Comment: very rare   Drug use: No   Sexual activity: Not Currently  Other Topics Concern   Not on file  Social History Narrative   Right handed    Living  alone.   Social Determinants of Health   Financial Resource Strain: Low Risk  (06/21/2023)   Overall Financial Resource Strain (CARDIA)    Difficulty of Paying Living Expenses: Not very hard  Food Insecurity: No Food Insecurity (06/21/2023)   Hunger Vital Sign    Worried About Running Out of Food in the Last Year: Never true    Ran Out of Food in the Last Year: Never true  Transportation Needs: No Transportation Needs (06/21/2023)   PRAPARE - Administrator, Civil Service (Medical): No    Lack of Transportation (Non-Medical): No  Physical Activity: Insufficiently Active (06/21/2023)   Exercise Vital Sign    Days of Exercise per Week: 5 days    Minutes of Exercise per Session: 20  min  Stress: No Stress Concern Present (06/21/2023)   [employer] of Occupational Health - Occupational Stress Questionnaire    Feeling of Stress : Not at all  Social Connections: Socially Isolated (06/21/2023)   Social Connection and Isolation Panel [NHANES]    Frequency of Communication with Friends and Family: Twice a week    Frequency of Social Gatherings with Friends and Family: Twice a week    Attends Religious Services: Never    Database administrator or Organizations: No  Attends Banker Meetings: Never    Marital Status: Widowed  Intimate Partner Violence: Not At Risk (06/21/2023)   Humiliation, Afraid, Rape, and Kick questionnaire    Fear of Current or Ex-Partner: No    Emotionally Abused: No    Physically Abused: No    Sexually Abused: No    Outpatient Medications Prior to Visit  Medication Sig Dispense Refill   acetaminophen (TYLENOL) 500 MG tablet Take 1,000 mg by mouth every 6 (six) hours as needed for moderate pain.     allopurinol (ZYLOPRIM) 100 MG tablet Take 1 tablet (100 mg total) by mouth daily. 30 tablet 2   amLODipine (NORVASC) 5 MG tablet TAKE 1 & 1/2 (ONE & ONE-HALF) TABLETS BY MOUTH IN THE EVENING 135 tablet 0   apixaban (ELIQUIS) 5 MG TABS tablet Take 1 tablet (5 mg total) by mouth 2 (two) times daily.     carvedilol (COREG) 12.5 MG tablet Take 1 tablet (12.5 mg total) by mouth 2 (two) times daily with a meal. 180 tablet 0   cyanocobalamin (VITAMIN B12) 1000 MCG tablet Take 1,000 mcg by mouth daily.     dapagliflozin propanediol (FARXIGA) 10 MG TABS tablet Take 10 mg by mouth in the morning.     furosemide (LASIX) 20 MG tablet Take 20 mg by mouth daily as needed for edema.     isosorbide dinitrate (ISORDIL) 5 MG tablet Take 1 tablet by mouth twice daily 180 tablet 0   Multiple Vitamins-Minerals (OCUVITE ADULT 50+ PO) Take 1 tablet by mouth daily.     olmesartan (BENICAR) 40 MG tablet Take 40 mg by mouth daily.     OZEMPIC, 0.25 OR 0.5  MG/DOSE, 2 MG/3ML SOPN Inject 0.5 mg into the skin once a week.     Probiotic Product (PROBIOTIC PO) Take 1 capsule by mouth 2 (two) times a week.     rosuvastatin (CRESTOR) 40 MG tablet Take 1 tablet by mouth once daily 90 tablet 3   SYNTHROID 112 MCG tablet Take 1 tablet (112 mcg total) by mouth daily before breakfast. 90 tablet 2   No facility-administered medications prior to visit.    Allergies  Allergen Reactions   Labetalol     headaches   Codeine Anxiety    Review of Systems  Constitutional:  Positive for chills. Negative for diaphoresis, fever and malaise/fatigue.  Respiratory:  Negative for shortness of breath.   Cardiovascular:  Negative for chest pain, palpitations and leg swelling.  Gastrointestinal:  Negative for abdominal pain, constipation, diarrhea, nausea and vomiting.  Genitourinary:  Negative for dysuria, frequency and urgency.  Neurological:  Negative for dizziness and focal weakness.       Objective:    Physical Exam Constitutional:      General: She is not in acute distress.    Appearance: She is not ill-appearing.  HENT:     Mouth/Throat:     Mouth: Mucous membranes are moist.     Pharynx: Oropharynx is clear.  Eyes:     Extraocular Movements: Extraocular movements intact.     Conjunctiva/sclera: Conjunctivae normal.  Cardiovascular:     Rate and Rhythm: Normal rate and regular rhythm.  Pulmonary:     Effort: Pulmonary effort is normal.     Breath sounds: Normal breath sounds.  Musculoskeletal:     Cervical back: Normal range of motion and neck supple. No tenderness.     Right lower leg: No edema.     Left lower leg: No edema.  Lymphadenopathy:     Cervical: No cervical adenopathy.  Skin:    General: Skin is warm and dry.  Neurological:     General: No focal deficit present.     Mental Status: She is alert and oriented to person, place, and time.     Motor: No weakness.     Coordination: Coordination normal.     Gait: Gait normal.   Psychiatric:        Mood and Affect: Mood normal.        Behavior: Behavior normal.        Thought Content: Thought content normal.      BP 124/84 (BP Location: Left Arm, Patient Position: Sitting, Cuff Size: Large)   Pulse 62   Temp 97.8 F (36.6 C) (Temporal)   Ht 5\' 5"  (1.651 m)   Wt 194 lb (88 kg)   SpO2 99%   BMI 32.28 kg/m  Wt Readings from Last 3 Encounters:  09/27/23 194 lb (88 kg)  09/20/23 193 lb (87.5 kg)  09/18/23 192 lb (87.1 kg)       Assessment & Plan:   Problem List Items Addressed This Visit       Cardiovascular and Mediastinum   Essential hypertension - Primary   Relevant Orders   CBC with Differential/Platelet (Completed)   Comprehensive metabolic panel (Completed)     Respiratory   OSA on CPAP     Endocrine   Acquired hypothyroidism   Relevant Orders   TSH (Completed)     Other   Chronic anticoagulation   Mixed hyperlipidemia   Other Visit Diagnoses     Vaginal itching       Chills (without fever)       Relevant Orders   CBC with Differential/Platelet (Completed)   TSH (Completed)   Iron, TIBC and Ferritin Panel (Completed)      Diflucan prescribed for presumed yeast infection from recent antibiotic therapy.  Check labs to look for underlying etiology for cold feeling.  No red flag symptoms.  No bleeding.  A1c 5.6% 4 wks ago.  Follow up in 3 months or pending lab results.   I am having Claudia Cantrell start on fluconazole. I am also having her maintain her dapagliflozin propanediol, Probiotic Product (PROBIOTIC PO), furosemide, acetaminophen, allopurinol, olmesartan, cyanocobalamin, Multiple Vitamins-Minerals (OCUVITE ADULT 50+ PO), isosorbide dinitrate, Ozempic (0.25 or 0.5 MG/DOSE), rosuvastatin, apixaban, Synthroid, amLODipine, and carvedilol.  Meds ordered this encounter  Medications   fluconazole (DIFLUCAN) 150 MG tablet    Sig: Take 1 tablet (150 mg total) by mouth once for 1 dose.    Dispense:  1 tablet    Refill:  0     Order Specific Question:   Supervising Provider    Answer:   Hillard Danker A [[id number]]

## 2023-09-28 LAB — IRON,TIBC AND FERRITIN PANEL
%SAT: 26 % (ref 16–45)
Ferritin: 161 ng/mL (ref 16–288)
Iron: 78 ug/dL (ref 45–160)
TIBC: 304 ug/dL (ref 250–450)

## 2023-09-30 DIAGNOSIS — N1832 Chronic kidney disease, stage 3b: Secondary | ICD-10-CM | POA: Diagnosis not present

## 2023-10-01 ENCOUNTER — Telehealth: Payer: Self-pay

## 2023-10-01 DIAGNOSIS — E890 Postprocedural hypothyroidism: Secondary | ICD-10-CM

## 2023-10-01 NOTE — Telephone Encounter (Signed)
Left vm for patient to callback as I was not able to hear the vm clearly.

## 2023-10-01 NOTE — Telephone Encounter (Signed)
Patient has been experiencing  chills and she had blood work done at her pcp office and was advised to contact our office about her tsh level.     Level was 0.67

## 2023-10-02 DIAGNOSIS — N2581 Secondary hyperparathyroidism of renal origin: Secondary | ICD-10-CM | POA: Diagnosis not present

## 2023-10-02 DIAGNOSIS — R7303 Prediabetes: Secondary | ICD-10-CM | POA: Diagnosis not present

## 2023-10-02 DIAGNOSIS — E559 Vitamin D deficiency, unspecified: Secondary | ICD-10-CM | POA: Diagnosis not present

## 2023-10-02 DIAGNOSIS — I129 Hypertensive chronic kidney disease with stage 1 through stage 4 chronic kidney disease, or unspecified chronic kidney disease: Secondary | ICD-10-CM | POA: Diagnosis not present

## 2023-10-02 DIAGNOSIS — R809 Proteinuria, unspecified: Secondary | ICD-10-CM | POA: Diagnosis not present

## 2023-10-02 DIAGNOSIS — N1832 Chronic kidney disease, stage 3b: Secondary | ICD-10-CM | POA: Diagnosis not present

## 2023-10-02 DIAGNOSIS — R600 Localized edema: Secondary | ICD-10-CM | POA: Diagnosis not present

## 2023-10-02 NOTE — Telephone Encounter (Signed)
Patient advised and lab appt scheduled. 

## 2023-10-02 NOTE — Telephone Encounter (Signed)
LMTCB

## 2023-10-17 ENCOUNTER — Other Ambulatory Visit: Payer: Self-pay | Admitting: Cardiovascular Disease

## 2023-10-21 DIAGNOSIS — G4733 Obstructive sleep apnea (adult) (pediatric): Secondary | ICD-10-CM | POA: Diagnosis not present

## 2023-10-21 DIAGNOSIS — I10 Essential (primary) hypertension: Secondary | ICD-10-CM | POA: Diagnosis not present

## 2023-11-12 ENCOUNTER — Other Ambulatory Visit: Payer: Self-pay

## 2023-11-12 DIAGNOSIS — E890 Postprocedural hypothyroidism: Secondary | ICD-10-CM

## 2023-11-13 ENCOUNTER — Other Ambulatory Visit: Payer: Medicare PPO

## 2023-11-13 DIAGNOSIS — E890 Postprocedural hypothyroidism: Secondary | ICD-10-CM | POA: Diagnosis not present

## 2023-11-14 ENCOUNTER — Encounter: Payer: Self-pay | Admitting: Internal Medicine

## 2023-11-14 LAB — TSH: TSH: 1.69 m[IU]/L (ref 0.40–4.50)

## 2023-11-17 ENCOUNTER — Other Ambulatory Visit: Payer: Self-pay | Admitting: Cardiovascular Disease

## 2023-11-18 ENCOUNTER — Telehealth: Payer: Self-pay

## 2023-11-18 NOTE — Telephone Encounter (Signed)
Patient given lab results and verbalized understanding  

## 2023-11-19 NOTE — Progress Notes (Unsigned)
Office Visit Note  Patient: Claudia Cantrell             Date of Birth: 1946/11/22           MRN: [id number]             PCP: Avanell Shackleton, NP-C Referring: Avanell Shackleton, NP-C Visit Date: 11/20/2023 Occupation: @GUAROCC @  Subjective:  No chief complaint on file.   History of Present Illness: Claudia Cantrell is a 77 y.o. female ***     Activities of Daily Living:  Patient reports morning stiffness for *** {minute/hour:19697}.   Patient {ACTIONS;DENIES/REPORTS:21021675::"Denies"} nocturnal pain.  Difficulty dressing/grooming: {ACTIONS;DENIES/REPORTS:21021675::"Denies"} Difficulty climbing stairs: {ACTIONS;DENIES/REPORTS:21021675::"Denies"} Difficulty getting out of chair: {ACTIONS;DENIES/REPORTS:21021675::"Denies"} Difficulty using hands for taps, buttons, cutlery, and/or writing: {ACTIONS;DENIES/REPORTS:21021675::"Denies"}  No Rheumatology ROS completed.   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/2022   Acquired hypothyroidism 10/30/2021   Stromal tumor determined by gastric biopsy 06/24/2020   Iatrogenic hyperthyroidism 05/02/2020   Dyslipidemia, goal LDL below 70 05/02/2020   Acute combined systolic and diastolic CHF, NYHA class 3 (HCC) 09/23/2019   Persistent atrial fibrillation (HCC) 09/21/2019   Submucosal lesion of stomach 07/19/2019   Abnormal CT of the abdomen 07/19/2019   Abnormal CT scan, stomach 06/17/2019    Advance directive declined by patient 01/14/2019   Chronic anticoagulation 01/14/2019   CRI (chronic renal insufficiency), stage 2 (mild) 01/14/2019   Prediabetes 01/14/2019   Persistent proteinuria 02/17/2018   Hyperuricemia 03/26/2017   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   Vitamin D deficiency 03/26/2017   Medication monitoring encounter 03/26/2017   Gout 10/08/2016   Essential hypertension    CAD S/P percutaneous coronary angioplasty    Nonischemic cardiomyopathy (HCC)    Chest pain with moderate risk for cardiac etiology 08/02/2016   Closed right ankle fracture 04/24/2015   Ankle fracture, bimalleolar, closed 04/24/2015   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   OSA on CPAP 10/09/2013    Past Medical History:  Diagnosis Date   Arthritis    rheumatoid   Back pain    CAD (coronary artery disease)    a. 1992 s/p MI and PTCA of unknown vessel;  b. 09/2010 Cath: LM nl, LAD 20p, LCX 22m, RCA 34m, RPL 20.   Cancer [facility])    Cervical cancer (HCC) 1977   Chest pain    Chronic kidney disease    Constipation    Family history of anesthesia complication    daughter has difficulty waking    GERD (gastroesophageal reflux disease)    occ   Gout 10/08/2016   History of heart attack    Hyperlipidemia    Hypertensive heart disease    Hypothyroidism    Joint pain    Nonischemic cardiomyopathy (HCC)    a. 07/2016 Echo: EF 40-45%, mild LVH, inferior akinesis, moderately dilated left atrium, trivial AI and MR.   PAF (paroxysmal atrial fibrillation) (HCC)    a. 07/2016 Admitted w/ AF RVR-->CHA2DS2VASc = 5-->Eliquis;  b.  07/2016 successful TEE/DCCV.   Pre-diabetes    Sleep apnea    a. Using CPAP.   SOB (shortness of breath)    Vitamin D deficiency     Family History  Problem Relation Age of Onset   Heart disease Mother    Sudden death Mother    Diabetes Father    Stroke Father    Bone cancer Sister    Sudden death Brother    Melanoma Brother    Heart disease  Brother    Colon cancer Neg Hx    Esophageal cancer Neg Hx    Inflammatory bowel disease Neg Hx    Liver disease Neg Hx    Pancreatic cancer Neg Hx    Rectal cancer Neg Hx    Stomach cancer Neg Hx    Past Surgical History:  Procedure Laterality Date   ABDOMINAL HYSTERECTOMY  1988   ATRIAL FIBRILLATION ABLATION N/A 07/28/2021   Procedure: ATRIAL FIBRILLATION ABLATION;  Surgeon: Regan Lemming, MD;  Location: [facility];  Service: Cardiovascular;  Laterality: N/A;   ATRIAL FIBRILLATION ABLATION N/A 12/14/2022   Procedure: ATRIAL FIBRILLATION ABLATION;  Surgeon: Regan Lemming, MD;  Location: [facility];  Service: Cardiovascular;  Laterality: N/A;   BACK SURGERY  1994   BIOPSY  09/02/2019   Procedure: BIOPSY;  Surgeon: Meridee Score Netty Starring., MD;  Location: [facility];  Service: Gastroenterology;;   BIOPSY  12/09/2019   Procedure: BIOPSY;  Surgeon: Lemar Lofty., MD;  Location: [facility];  Service: Gastroenterology;;   BIOPSY  03/08/2021   Procedure: BIOPSY;  Surgeon: Lemar Lofty., MD;  Location: [facility];  Service: Gastroenterology;;   BIOPSY  07/08/2023   Procedure: BIOPSY;  Surgeon: Lemar Lofty., MD;  Location: [facility];  Service: Gastroenterology;;   CARDIAC CATHETERIZATION  1991 AND 1992   PTCA BY DR Meryl Crutch   CARDIOVERSION N/A 08/01/2016   Procedure: CARDIOVERSION;  Surgeon: Lewayne Bunting, MD;  Location: [facility] ENDOSCOPY;  Service: Cardiovascular;  Laterality: N/A;   CARDIOVERSION N/A 01/11/2020   Procedure: CARDIOVERSION;  Surgeon: Wendall Stade, MD;  Location: [facility] ENDOSCOPY;  Service: Cardiovascular;  Laterality: N/A;   CARDIOVERSION N/A 07/08/2020   Procedure: CARDIOVERSION;  Surgeon: Little Ishikawa, MD;  Location: [facility] ENDOSCOPY;  Service: Cardiovascular;  Laterality: N/A;   CARDIOVERSION N/A 05/31/2022   Procedure: CARDIOVERSION;  Surgeon: Chrystie Nose, MD;  Location: [facility] ENDOSCOPY;  Service:  Cardiovascular;  Laterality: N/A;   CARDIOVERSION N/A 07/18/2022   Procedure: CARDIOVERSION;  Surgeon: Little Ishikawa, MD;  Location: [facility] ENDOSCOPY;  Service: Cardiovascular;  Laterality: N/A;   ENDOSCOPIC MUCOSAL RESECTION N/A 12/09/2019   Procedure: ENDOSCOPIC MUCOSAL RESECTION;  Surgeon: Lemar Lofty., MD;  Location: [facility] ENDOSCOPY;  Service: Gastroenterology;  Laterality: N/A;   ESOPHAGOGASTRODUODENOSCOPY N/A 03/08/2021   Procedure: ESOPHAGOGASTRODUODENOSCOPY (EGD);  Surgeon: Lemar Lofty., MD;  Location: [facility];  Service: Gastroenterology;  Laterality: N/A;   ESOPHAGOGASTRODUODENOSCOPY (EGD) WITH PROPOFOL N/A 09/02/2019   Procedure: ESOPHAGOGASTRODUODENOSCOPY (EGD) WITH PROPOFOL;  Surgeon: Meridee Score Netty Starring., MD;  Location: [facility] ENDOSCOPY;  Service: Gastroenterology;  Laterality: N/A;   ESOPHAGOGASTRODUODENOSCOPY (EGD) WITH PROPOFOL N/A 12/09/2019   Procedure: ESOPHAGOGASTRODUODENOSCOPY (EGD) WITH PROPOFOL;  Surgeon: Meridee Score Netty Starring., MD;  Location: [facility] ENDOSCOPY;  Service: Gastroenterology;  Laterality: N/A;   ESOPHAGOGASTRODUODENOSCOPY (EGD) WITH PROPOFOL N/A 07/08/2023   Procedure: ESOPHAGOGASTRODUODENOSCOPY (EGD) WITH PROPOFOL;  Surgeon: Meridee Score Netty Starring., MD;  Location: [facility] ENDOSCOPY;  Service: Gastroenterology;  Laterality: N/A;   EUS  N/A 09/02/2019   Procedure: UPPER ENDOSCOPIC ULTRASOUND (EUS) RADIAL;  Surgeon: Lemar Lofty., MD;  Location: [facility] ENDOS[facility];  Service: Gastroenterology;  Laterality: N/A;  EUS radial/linear   EUS N/A 12/09/2019   Procedure: UPPER ENDOSCOPIC ULTRASOUND (EUS) RADIAL;  Surgeon: Lemar Lofty., MD;  Location: [facility] ENDOSCOPY;  Service: Gastroenterology;  Laterality: N/A;   EUS  12/09/2019   Procedure: UPPER ENDOSCOPIC ULTRASOUND (EUS) LINEAR;  Surgeon: Lemar Lofty., MD;  Location: [facility];  Service: Gastroenterology;;   EUS N/A 03/08/2021   Procedure: UPPER ENDOSCOPIC ULTRASOUND (EUS)  RADIAL;  Surgeon: Lemar Lofty., MD;  Location: [facility];  Service: Gastroenterology;  Laterality: N/A;   FINE NEEDLE ASPIRATION  09/02/2019   Procedure: FINE NEEDLE ASPIRATION (FNA) LINEAR;  Surgeon: Lemar Lofty., MD;  Location: [facility];  Service: Gastroenterology;;   FINE NEEDLE ASPIRATION  12/09/2019   Procedure: FINE NEEDLE ASPIRATION (FNA) LINEAR;  Surgeon: Lemar Lofty., MD;  Location: [facility];  Service: Gastroenterology;;   HEMOSTASIS CLIP PLACEMENT  12/09/2019   Procedure: HEMOSTASIS CLIP PLACEMENT;  Surgeon: Lemar Lofty., MD;  Location: [facility];  Service: Gastroenterology;;   ORIF ANKLE FRACTURE Right 04/27/2015   Procedure: OPEN REDUCTION INTERNAL FIXATION (ORIF) RIGHT BIMALLEOLAR ANKLE FRACTURE WITH SYNDESMOSIS FIXATION;  Surgeon: Tarry Kos, MD;  Location: [facility] OR;  Service: Orthopedics;  Laterality: Right;   SUBMUCOSAL LIFTING INJECTION  12/09/2019   Procedure: SUBMUCOSAL LIFTING INJECTION;  Surgeon: Lemar Lofty., MD;  Location: [facility] ENDOSCOPY;  Service: Gastroenterology;;   TEE WITHOUT CARDIOVERSION N/A 08/01/2016   Procedure: TRANSESOPHAGEAL ECHOCARDIOGRAM (TEE);  Surgeon: Lewayne Bunting, MD;  Location: [facility] ENDOSCOPY;  Service: Cardiovascular;  Laterality: N/A;   THYROIDECTOMY  11/24/2013   DR Derrell Lolling   THYROIDECTOMY N/A 11/24/2013   Procedure: TOTAL THYROIDECTOMY;  Surgeon: Ernestene Mention, MD;  Location: [facility] OR;  Service: General;  Laterality: N/A;   TRANSTHORACIC ECHOCARDIOGRAM  01/15/2013   EF 55% TO 65%. PROBABLE MILD HYPOKINESIS OF THE INFERIOR MYOCARDIUM. GRADE 1 DIASTOLIC DYSFUNCTION. TRIAL AR.LA IS MILDLY DILATED.   UPPER ESOPHAGEAL ENDOSCOPIC ULTRASOUND (EUS) N/A 07/08/2023   Procedure: UPPER ESOPHAGEAL ENDOSCOPIC ULTRASOUND (EUS);  Surgeon: Lemar Lofty., MD;  Location: [facility];  Service: Gastroenterology;  Laterality: N/A;   Social History   Social History Narrative   Right handed     Living  alone.   Immunization History  Administered Date(s) Administered   Fluad Quad(high Dose 65+) 08/22/2019, 09/20/2021   Influenza Nasal 11/25/2013   Influenza, High Dose Seasonal PF 12/30/2014, 10/14/2017, 11/01/2018   Influenza,inj,Quad PF,6+ Mos 11/25/2013   Influenza-Unspecified 12/30/2012, 11/09/2015, 12/24/2016, 09/16/2020   PFIZER(Purple Top)SARS-COV-2 Vaccination 12/29/2019, 01/18/2020, 09/16/2020   Pfizer Covid-19 Vaccine Bivalent Booster 5y-11y 10/21/2021   Pneumococcal Conjugate-13 12/30/2014, 10/14/2017   Pneumococcal Polysaccharide-23 11/25/2013, 08/22/2019   Zoster Recombinant(Shingrix) 10/14/2017, 01/21/2018   Zoster, Live 12/15/2008     Objective: Vital Signs: There were no vitals taken for this visit.   Physical Exam   Musculoskeletal Exam: ***  CDAI Exam: CDAI Score: -- Patient Global: --; Provider Global: -- Swollen: --; Tender: -- Joint Exam 11/20/2023   No joint exam has been documented for this visit   There is currently no information documented on the homunculus. Go to the Rheumatology activity and complete the homunculus joint exam.  Investigation: No additional findings.  Imaging: No results found.  Recent Labs: Lab Results  Component Value Date   WBC 3.4 (L) 09/27/2023   HGB 15.9 (H) 09/27/2023   PLT 203.0  09/27/2023   NA 142 09/27/2023   K 3.9 09/27/2023   CL 103 09/27/2023   CO2 30 09/27/2023   GLUCOSE 83 09/27/2023   BUN 19 09/27/2023   CREATININE 1.07 09/27/2023   BILITOT 0.8 09/27/2023   ALKPHOS 92 09/27/2023   AST 20 09/27/2023   ALT 15 09/27/2023   PROT 8.3 09/27/2023   ALBUMIN 4.8 09/27/2023   CALCIUM 10.4 09/27/2023   GFRAA 74 09/16/2020   December 11, 2022 uric acid 2.4  Speciality Comments: No specialty comments available.  Procedures:  No procedures performed Allergies: Labetalol and Codeine   Assessment / Plan:     Visit Diagnoses: No diagnosis found.  Orders: No orders of the defined types were  placed in this encounter.  No orders of the defined types were placed in this encounter.   Face-to-face time spent with patient was *** minutes. Greater than 50% of time was spent in counseling and coordination of care.  Follow-Up Instructions: No follow-ups on file.   Pollyann Savoy, MD  Note - This record has been created using Animal nutritionist.  Chart creation errors have been sought, but may not always  have been located. Such creation errors do not reflect on  the standard of medical care.

## 2023-11-20 ENCOUNTER — Ambulatory Visit: Payer: Medicare PPO | Admitting: Rheumatology

## 2023-11-20 DIAGNOSIS — R260 Ataxic gait: Secondary | ICD-10-CM

## 2023-11-20 DIAGNOSIS — G4733 Obstructive sleep apnea (adult) (pediatric): Secondary | ICD-10-CM | POA: Diagnosis not present

## 2023-11-20 DIAGNOSIS — G8929 Other chronic pain: Secondary | ICD-10-CM

## 2023-11-20 DIAGNOSIS — Z79899 Other long term (current) drug therapy: Secondary | ICD-10-CM

## 2023-11-20 DIAGNOSIS — M79642 Pain in left hand: Secondary | ICD-10-CM

## 2023-11-20 DIAGNOSIS — Z8509 Personal history of malignant neoplasm of other digestive organs: Secondary | ICD-10-CM

## 2023-11-20 DIAGNOSIS — Z7901 Long term (current) use of anticoagulants: Secondary | ICD-10-CM

## 2023-11-20 DIAGNOSIS — Z8639 Personal history of other endocrine, nutritional and metabolic disease: Secondary | ICD-10-CM

## 2023-11-20 DIAGNOSIS — Z8679 Personal history of other diseases of the circulatory system: Secondary | ICD-10-CM

## 2023-11-20 DIAGNOSIS — M65342 Trigger finger, left ring finger: Secondary | ICD-10-CM

## 2023-11-20 DIAGNOSIS — R7309 Other abnormal glucose: Secondary | ICD-10-CM

## 2023-11-20 DIAGNOSIS — M1A09X0 Idiopathic chronic gout, multiple sites, without tophus (tophi): Secondary | ICD-10-CM

## 2023-11-20 DIAGNOSIS — I10 Essential (primary) hypertension: Secondary | ICD-10-CM | POA: Diagnosis not present

## 2023-11-20 NOTE — Progress Notes (Unsigned)
Office Visit Note  Patient: Claudia Cantrell             Date of Birth: Jun 27, 1946           MRN: [id number]             PCP: Avanell Shackleton, NP-C Referring: Avanell Shackleton, NP-C Visit Date: 11/21/2023 Occupation: @GUAROCC @  Subjective:  Right shoulder pain  History of Present Illness: MARSHELIA Cantrell is a 77 y.o. female with with gout and osteoarthritis.  She states she has been having increased pain and discomfort in her right shoulder joint again.  She had good response to cortisone injection in the past.  She states she is having difficulty raising her arm and she has been taking Tylenol continuously without much relief.  She has not had any gout flares since the last visit.  She has been taking allopurinol 100 mg daily without any interruption.  None of the other joints are painful.  Patient states that she recently had a colonoscopy which was normal.  She also had bilateral cataract surgery.    Activities of Daily Living:  Patient reports morning stiffness for less 30 minutes.   Patient Reports nocturnal pain.  Difficulty dressing/grooming: Denies Difficulty climbing stairs: Denies Difficulty getting out of chair: Denies Difficulty using hands for taps, buttons, cutlery, and/or writing: Denies  Review of Systems  Constitutional:  Negative for fatigue.  HENT:  Negative for mouth sores and mouth dryness.   Eyes:  Negative for dryness.  Respiratory:  Negative for shortness of breath.   Cardiovascular:  Negative for chest pain and palpitations.  Gastrointestinal:  Negative for blood in stool, constipation and diarrhea.  Endocrine: Negative for increased urination.  Genitourinary:  Negative for involuntary urination.  Musculoskeletal:  Positive for joint pain, joint pain, joint swelling, muscle weakness and morning stiffness. Negative for gait problem, myalgias, muscle tenderness and myalgias.  Skin:  Positive for hair loss. Negative for color change, rash and sensitivity to  sunlight.  Allergic/Immunologic: Negative for susceptible to infections.  Neurological:  Negative for dizziness and headaches.  Hematological:  Negative for swollen glands.  Psychiatric/Behavioral:  Negative for depressed mood and sleep disturbance. The patient is not nervous/anxious.     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/2022   Acquired hypothyroidism 10/30/2021   Stromal tumor determined by gastric biopsy 06/24/2020   Iatrogenic hyperthyroidism 05/02/2020   Dyslipidemia, goal LDL below 70 05/02/2020   Acute combined systolic and diastolic CHF, NYHA class 3 (HCC) 09/23/2019   Persistent atrial fibrillation (HCC) 09/21/2019   Submucosal lesion of stomach 07/19/2019   Abnormal CT of the abdomen 07/19/2019   Abnormal CT scan, stomach 06/17/2019   Advance directive declined by patient 01/14/2019   Chronic anticoagulation 01/14/2019   CRI (chronic renal insufficiency), stage 2 (mild) 01/14/2019   Prediabetes 01/14/2019   Persistent proteinuria 02/17/2018   Hyperuricemia 03/26/2017   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 1992 s/p MI and PTCA of unknown vessel;  b. 09/2010 Cath: LM nl, LAD 20p, LCX 25m, RCA 24m, RPL 20.   Cancer [facility])    Cervical cancer (HCC) 1977   Chest pain    Chronic kidney disease    Constipation    Family history of anesthesia complication    daughter has difficulty waking    GERD (gastroesophageal reflux disease)    occ   Gout 10/08/2016   History of heart attack    Hyperlipidemia    Hypertensive heart disease    Hypothyroidism    Joint pain    Nonischemic cardiomyopathy (HCC)    a. 07/2016 Echo: EF 40-45%, mild LVH, inferior akinesis, moderately dilated left atrium, trivial AI and MR.   PAF (paroxysmal atrial fibrillation) (HCC)    a. 07/2016 Admitted w/ AF RVR-->CHA2DS2VASc = 5-->Eliquis;  b. 07/2016 successful TEE/DCCV.   Pre-diabetes    Sleep apnea    a. Using CPAP.   SOB (shortness of breath)    Vitamin D deficiency     Family History  Problem Relation Age of Onset   Heart disease Mother    Sudden death Mother    Diabetes Father    Stroke Father    Bone cancer Sister    Sudden death Brother    Melanoma Brother    Heart disease Brother    Colon cancer Neg Hx    Esophageal cancer Neg Hx    Inflammatory bowel disease Neg Hx    Liver disease Neg Hx    Pancreatic cancer Neg Hx    Rectal cancer Neg Hx    Stomach cancer Neg Hx    Past Surgical History:  Procedure Laterality Date   ABDOMINAL HYSTERECTOMY  12/10/1986   ATRIAL FIBRILLATION ABLATION N/A 07/28/2021   Procedure: ATRIAL FIBRILLATION ABLATION;  Surgeon: Regan Lemming, MD;  Location: [facility];  Service: Cardiovascular;  Laterality: N/A;   ATRIAL FIBRILLATION ABLATION N/A 12/14/2022   Procedure: ATRIAL FIBRILLATION ABLATION;  Surgeon:  Regan Lemming, MD;  Location: [facility];  Service: Cardiovascular;  Laterality: N/A;   BACK SURGERY  12/10/1992   BIOPSY  09/02/2019   Procedure: BIOPSY;  Surgeon: Meridee Score Netty Starring., MD;  Location: [facility];  Service: Gastroenterology;;   BIOPSY  12/09/2019   Procedure: BIOPSY;  Surgeon: Lemar Lofty., MD;  Location: [facility];  Service: Gastroenterology;;   BIOPSY  03/08/2021   Procedure: BIOPSY;  Surgeon: Lemar Lofty., MD;  Location: [facility];  Service: Gastroenterology;;   BIOPSY  07/08/2023   Procedure: BIOPSY;  Surgeon: Lemar Lofty., MD;  Location: [facility];  Service: Gastroenterology;;   CARDIAC CATHETERIZATION  1991 AND 1992   PTCA BY DR Meryl Crutch   CARDIOVERSION N/A 08/01/2016   Procedure: CARDIOVERSION;  Surgeon: Lewayne Bunting, MD;  Location: [facility] ENDOSCOPY;  Service: Cardiovascular;  Laterality: N/A;   CARDIOVERSION N/A 01/11/2020   Procedure: CARDIOVERSION;  Surgeon: Wendall Stade, MD;  Location: [facility] ENDOSCOPY;  Service: Cardiovascular;  Laterality: N/A;   CARDIOVERSION N/A 07/08/2020   Procedure: CARDIOVERSION;  Surgeon: Little Ishikawa, MD;  Location: [facility] ENDOSCOPY;  Service: Cardiovascular;  Laterality: N/A;   CARDIOVERSION N/A 05/31/2022   Procedure: CARDIOVERSION;  Surgeon: Chrystie Nose, MD;  Location: [facility] ENDOSCOPY;  Service: Cardiovascular;  Laterality: N/A;   CARDIOVERSION N/A 07/18/2022   Procedure: CARDIOVERSION;  Surgeon: Little Ishikawa, MD;  Location: [facility] ENDOSCOPY;  Service: Cardiovascular;  Laterality: N/A;   CATARACT EXTRACTION  2024   June/July 2024 bilateral eyes   COLONOSCOPY  07/2023   ENDOSCOPIC MUCOSAL RESECTION N/A 12/09/2019   Procedure: ENDOSCOPIC MUCOSAL RESECTION;  Surgeon: Lemar Lofty., MD;  Location: [facility] ENDOSCOPY;  Service: Gastroenterology;  Laterality: N/A;   ESOPHAGOGASTRODUODENOSCOPY N/A 03/08/2021   Procedure: ESOPHAGOGASTRODUODENOSCOPY  (EGD);  Surgeon: Lemar Lofty., MD;  Location: [facility]COPY;  Service: Gastroenterology;  Laterality: N/A;   ESOPHAGOGASTRODUODENOSCOPY (EGD) WITH PROPOFOL N/A 09/02/2019   Procedure: ESOPHAGOGASTRODUODENOSCOPY (EGD) WITH PROPOFOL;  Surgeon: Meridee Score Netty Starring., MD;  Location: [facility] ENDOSCOPY;  Service: Gastroenterology;  Laterality: N/A;   ESOPHAGOGASTRODUODENOSCOPY (EGD) WITH PROPOFOL N/A 12/09/2019   Procedure: ESOPHAGOGASTRODUODENOSCOPY (EGD) WITH PROPOFOL;  Surgeon: Meridee Score Netty Starring., MD;  Location: [facility] ENDOSCOPY;  Service: Gastroenterology;  Laterality: N/A;   ESOPHAGOGASTRODUODENOSCOPY (EGD) WITH PROPOFOL N/A 07/08/2023   Procedure: ESOPHAGOGASTRODUODENOSCOPY (EGD) WITH PROPOFOL;  Surgeon: Meridee Score Netty Starring., MD;  Location: [facility] ENDOSCOPY;  Service: Gastroenterology;  Laterality: N/A;   EUS N/A 09/02/2019   Procedure: UPPER ENDOSCOPIC ULTRASOUND (EUS) RADIAL;  Surgeon: Lemar Lofty., MD;  Location: [facility] ENDOSCOPY;  Service: Gastroenterology;  Laterality: N/A;  EUS radial/linear   EUS N/A 12/09/2019   Procedure: UPPER ENDOSCOPIC ULTRASOUND (EUS) RADIAL;  Surgeon: Lemar Lofty., MD;  Location: [facility] ENDOSCOPY;  Service: Gastroenterology;  Laterality: N/A;   EUS  12/09/2019   Procedure: UPPER ENDOSCOPIC ULTRASOUND (EUS) LINEAR;  Surgeon: Lemar Lofty., MD;  Location: [facility] ENDOSCOPY;  Service: Gastroenterology;;   EUS N/A 03/08/2021   Procedure: UPPER ENDOSCOPIC ULTRASOUND (EUS) RADIAL;  Surgeon: Lemar Lofty., MD;  Location: [facility] ENDOSCOPY;  Service: Gastroenterology;  Laterality: N/A;   FINE NEEDLE ASPIRATION  09/02/2019   Procedure: FINE NEEDLE ASPIRATION (FNA) LINEAR;  Surgeon: Lemar Lofty., MD;  Location: [facility] ENDOSCOPY;  Service: Gastroenterology;;   FINE NEEDLE ASPIRATION  12/09/2019   Procedure: FINE NEEDLE ASPIRATION (FNA) LINEAR;  Surgeon: Lemar Lofty., MD;  Location: [facility] ENDOSCOPY;  Service: Gastroenterology;;    HEMOSTASIS CLIP PLACEMENT  12/09/2019   Procedure: HEMOSTASIS CLIP PLACEMENT;  Surgeon: Lemar Lofty., MD;  Location: [facility] ENDOSCOPY;  Service: Gastroenterology;;   ORIF ANKLE FRACTURE Right 04/27/2015   Procedure: OPEN REDUCTION INTERNAL FIXATION (ORIF) RIGHT BIMALLEOLAR ANKLE FRACTURE WITH SYNDESMOSIS FIXATION;  Surgeon: Tarry Kos, MD;  Location: [facility] OR;  Service: Orthopedics;  Laterality: Right;   SUBMUCOSAL LIFTING INJECTION  12/09/2019   Procedure: SUBMUCOSAL LIFTING INJECTION;  Surgeon: Lemar Lofty., MD;  Location: [facility] ENDOSCOPY;  Service: Gastroenterology;;   TEE WITHOUT CARDIOVERSION N/A 08/01/2016   Procedure: TRANSESOPHAGEAL ECHOCARDIOGRAM (TEE);  Surgeon: Lewayne Bunting, MD;  Location: [facility] ENDOSCOPY;  Service: Cardiovascular;  Laterality: N/A;   THYROIDECTOMY  11/24/2013   DR Derrell Lolling   THYROIDECTOMY N/A 11/24/2013   Procedure: TOTAL THYROIDECTOMY;  Surgeon: Ernestene Mention, MD;  Location: [facility] OR;  Service: General;  Laterality: N/A;   TRANSTHORACIC ECHOCARDIOGRAM  01/15/2013   EF 55% TO 65%. PROBABLE MILD HYPOKINESIS OF THE  INFERIOR MYOCARDIUM. GRADE 1 DIASTOLIC DYSFUNCTION. TRIAL AR.LA IS MILDLY DILATED.   UPPER ESOPHAGEAL ENDOSCOPIC ULTRASOUND (EUS) N/A 07/08/2023   Procedure: UPPER ESOPHAGEAL ENDOSCOPIC ULTRASOUND (EUS);  Surgeon: Lemar Lofty., MD;  Location: [facility];  Service: Gastroenterology;  Laterality: N/A;   Social History   Social History Narrative   Right handed    Living  alone.   Immunization History  Administered Date(s) Administered   Fluad Quad(high Dose 65+) 08/22/2019, 09/20/2021   Influenza Nasal 11/25/2013   Influenza, High Dose Seasonal PF 12/30/2014, 10/14/2017, 11/01/2018   Influenza,inj,Quad PF,6+ Mos 11/25/2013   Influenza-Unspecified 12/30/2012, 11/09/2015, 12/24/2016, 09/16/2020   PFIZER(Purple Top)SARS-COV-2 Vaccination 12/29/2019, 01/18/2020, 09/16/2020   Pfizer Covid-19 Vaccine Bivalent Booster 5y-11y  10/21/2021   Pneumococcal Conjugate-13 12/30/2014, 10/14/2017   Pneumococcal Polysaccharide-23 11/25/2013, 08/22/2019   Zoster Recombinant(Shingrix) 10/14/2017, 01/21/2018   Zoster, Live 12/15/2008     Objective: Vital Signs: BP (!) 153/101 (BP Location: Left Arm, Patient Position: Sitting, Cuff Size: Normal)   Pulse 78   Resp 16   Ht 5\' 5"  (1.651 m)   Wt 198 lb 9.6 oz (90.1 kg)   BMI 33.05 kg/m    Physical Exam Vitals and nursing note reviewed.  Constitutional:      Appearance: She is well-developed.  HENT:     Head: Normocephalic and atraumatic.  Eyes:     Conjunctiva/sclera: Conjunctivae normal.  Cardiovascular:     Rate and Rhythm: Normal rate and regular rhythm.     Heart sounds: Normal heart sounds.  Pulmonary:     Effort: Pulmonary effort is normal.     Breath sounds: Normal breath sounds.  Abdominal:     General: Bowel sounds are normal.     Palpations: Abdomen is soft.  Musculoskeletal:     Cervical back: Normal range of motion.  Lymphadenopathy:     Cervical: No cervical adenopathy.  Skin:    General: Skin is warm and dry.     Capillary Refill: Capillary refill takes less than 2 seconds.  Neurological:     Mental Status: She is alert and oriented to person, place, and time.  Psychiatric:        Behavior: Behavior normal.      Musculoskeletal Exam: She had good range of motion of the cervical spine without any discomfort.  Thoracic kyphosis was noted.  She had discomfort range of motion of her lumbar spine.  Right shoulder joint abduction was limited to 90 degrees which was painful.  She had painful internal rotation.  Left shoulder joint was in good range of motion.  There was no tenderness over elbows, MCPs, PIPs and DIPs.  Hip joints and knee joints were in good range of motion without any warmth swelling or effusion.  There was no tenderness over ankles or MTPs.  CDAI Exam: CDAI Score: -- Patient Global: --; Provider Global: -- Swollen: --; Tender:  -- Joint Exam 11/21/2023   No joint exam has been documented for this visit   There is currently no information documented on the homunculus. Go to the Rheumatology activity and complete the homunculus joint exam.  Investigation: No additional findings.  Imaging: No results found.  Recent Labs: Lab Results  Component Value Date   WBC 3.4 (L) 09/27/2023   HGB 15.9 (H) 09/27/2023   PLT 203.0 09/27/2023   NA 142 09/27/2023   K 3.9 09/27/2023   CL 103 09/27/2023   CO2 30 09/27/2023   GLUCOSE 83 09/27/2023   BUN 19 09/27/2023   CREATININE  1.07 09/27/2023   BILITOT 0.8 09/27/2023   ALKPHOS 92 09/27/2023   AST 20 09/27/2023   ALT 15 09/27/2023   PROT 8.3 09/27/2023   ALBUMIN 4.8 09/27/2023   CALCIUM 10.4 09/27/2023   GFRAA 74 09/16/2020    Speciality Comments: No specialty comments available.  Procedures:  Large Joint Inj: R glenohumeral on 11/21/2023 2:25 PM Indications: pain Details: 27 G 1.5 in needle, posterior approach  Arthrogram: No  Medications: 40 mg triamcinolone acetonide 40 MG/ML; 1.5 mL lidocaine 1 % Aspirate: 0 mL Outcome: tolerated well, no immediate complications Procedure, treatment alternatives, risks and benefits explained, specific risks discussed. Consent was given by the patient. Immediately prior to procedure a time out was called to verify the correct patient, procedure, equipment, support staff and site/side marked as required. Patient was prepped and draped in the usual sterile fashion.     Allergies: Labetalol and Codeine   Assessment / Plan:     Visit Diagnoses: Idiopathic chronic gout of multiple sites without tophus -patient denies having a gout flare.  She continues to be on allopurinol 100 mg p.o. daily without any interruption.  Uric acid was 2.4 on December 11, 2022.  Patient reports recent weight gain.  Dietary modifications were discussed.  Labs from September 27, 2023 CBC showed low white cell count of 3.4.  Hemoglobin was slightly  elevated at 15.9.  CMP was normal.  Patient was advised to get uric acid with her next labs.  Chronic pain of both shoulders she continues to have have some discomfort in her both shoulders.  Right shoulder joint is especially painful.  She had discomfort with internal rotation and abduction of her right shoulder joint.  She requested right shoulder joint injection.  After informed consent was obtained right glenohumeral joint was injected with lidocaine and Kenalog as described above.  Patient tolerated the procedure well.  Postprocedure instructions were given.  History of hypertension-blood pressure was elevated at 153/88 today.  Patient states she missed her blood pressure medication and took it right before her visit.  She was advised to monitor blood pressure closely and follow-up with her PCP.  Chronic anticoagulation-she is on Eliquis.  History of coronary artery disease  History of atrial fibrillation-status post ablation 07/28/2021 and December 14, 2021.  She is on Eliquis.  History of hyperlipidemia  History of gastrointestinal stromal tumor (GIST)  OSA on CPAP-patient is not very compliant with CPAP patient.  Elevated hemoglobin A1c-patient states she was on Ozempic.  She lost a lot of weight and now she has regained since she stopped Ozempic.  History of vitamin D deficiency  Titubation  Orders: Orders Placed This Encounter  Procedures   Large Joint Inj   No orders of the defined types were placed in this encounter.     Follow-Up Instructions: Return in about 6 months (around 05/21/2024) for Osteoarthritis, Gout.   Pollyann Savoy, MD  Note - This record has been created using Animal nutritionist.  Chart creation errors have been sought, but may not always  have been located. Such creation errors do not reflect on  the standard of medical care.

## 2023-11-21 ENCOUNTER — Ambulatory Visit: Payer: Medicare PPO | Attending: Rheumatology | Admitting: Rheumatology

## 2023-11-21 ENCOUNTER — Encounter: Payer: Self-pay | Admitting: Rheumatology

## 2023-11-21 VITALS — BP 153/101 | HR 78 | Resp 16 | Ht 65.0 in | Wt 198.6 lb

## 2023-11-21 DIAGNOSIS — M25512 Pain in left shoulder: Secondary | ICD-10-CM

## 2023-11-21 DIAGNOSIS — G4733 Obstructive sleep apnea (adult) (pediatric): Secondary | ICD-10-CM | POA: Diagnosis not present

## 2023-11-21 DIAGNOSIS — Z8509 Personal history of malignant neoplasm of other digestive organs: Secondary | ICD-10-CM

## 2023-11-21 DIAGNOSIS — Z8639 Personal history of other endocrine, nutritional and metabolic disease: Secondary | ICD-10-CM

## 2023-11-21 DIAGNOSIS — G8929 Other chronic pain: Secondary | ICD-10-CM | POA: Diagnosis not present

## 2023-11-21 DIAGNOSIS — M25511 Pain in right shoulder: Secondary | ICD-10-CM

## 2023-11-21 DIAGNOSIS — Z7901 Long term (current) use of anticoagulants: Secondary | ICD-10-CM

## 2023-11-21 DIAGNOSIS — Z8679 Personal history of other diseases of the circulatory system: Secondary | ICD-10-CM

## 2023-11-21 DIAGNOSIS — R260 Ataxic gait: Secondary | ICD-10-CM

## 2023-11-21 DIAGNOSIS — R7309 Other abnormal glucose: Secondary | ICD-10-CM | POA: Diagnosis not present

## 2023-11-21 DIAGNOSIS — E790 Hyperuricemia without signs of inflammatory arthritis and tophaceous disease: Secondary | ICD-10-CM

## 2023-11-21 DIAGNOSIS — M1A09X0 Idiopathic chronic gout, multiple sites, without tophus (tophi): Secondary | ICD-10-CM | POA: Diagnosis not present

## 2023-11-21 MED ORDER — TRIAMCINOLONE ACETONIDE 40 MG/ML IJ SUSP
40.0000 mg | INTRAMUSCULAR | Status: AC | PRN
Start: 2023-11-21 — End: 2023-11-21
  Administered 2023-11-21: 40 mg via INTRA_ARTICULAR

## 2023-11-21 MED ORDER — LIDOCAINE HCL 1 % IJ SOLN
1.5000 mL | INTRAMUSCULAR | Status: AC | PRN
Start: 2023-11-21 — End: 2023-11-21
  Administered 2023-11-21: 1.5 mL

## 2023-11-21 NOTE — Patient Instructions (Signed)
Please get uric acid level with your next labs.

## 2023-11-29 ENCOUNTER — Other Ambulatory Visit: Payer: Self-pay | Admitting: Internal Medicine

## 2023-11-29 ENCOUNTER — Other Ambulatory Visit: Payer: Self-pay

## 2023-12-07 ENCOUNTER — Other Ambulatory Visit: Payer: Self-pay | Admitting: Cardiovascular Disease

## 2023-12-07 DIAGNOSIS — I4819 Other persistent atrial fibrillation: Secondary | ICD-10-CM

## 2023-12-09 NOTE — Telephone Encounter (Signed)
Prescription refill request for Eliquis received. Indication:afib Last office visit:10/24 Scr:1.07  10/24 Age: 77 Weight:90.1  kg  Prescription refilled

## 2023-12-16 ENCOUNTER — Ambulatory Visit: Payer: Medicare PPO | Attending: Cardiovascular Disease | Admitting: Cardiovascular Disease

## 2023-12-16 ENCOUNTER — Encounter: Payer: Self-pay | Admitting: *Deleted

## 2023-12-16 DIAGNOSIS — I4819 Other persistent atrial fibrillation: Secondary | ICD-10-CM

## 2023-12-16 DIAGNOSIS — G4733 Obstructive sleep apnea (adult) (pediatric): Secondary | ICD-10-CM

## 2023-12-24 ENCOUNTER — Telehealth: Payer: Self-pay | Admitting: Family Medicine

## 2023-12-24 NOTE — Telephone Encounter (Signed)
 Copied from CRM [id number]. Topic: General - Other >> Dec 24, 2023 10:02 AM Taleah C wrote: Reason for CRM: pt called and stated that she will be going to [place] in 3 weeks and wanted to ask Vickie If she needs to get any shots for safety precautions. Please call and advise at [phone].

## 2023-12-24 NOTE — Telephone Encounter (Signed)
 LM for pt to call back and make separate visit appt sooner per PCP request. Advised her to ask for Dahlia Client if she can't get in this week and I can try and open a blocking

## 2023-12-26 ENCOUNTER — Ambulatory Visit (INDEPENDENT_AMBULATORY_CARE_PROVIDER_SITE_OTHER): Payer: 59 | Admitting: Family Medicine

## 2023-12-26 ENCOUNTER — Encounter: Payer: Self-pay | Admitting: Family Medicine

## 2023-12-26 VITALS — BP 136/82 | HR 56 | Temp 97.6°F | Ht 65.0 in | Wt 199.0 lb

## 2023-12-26 DIAGNOSIS — Z2989 Encounter for other specified prophylactic measures: Secondary | ICD-10-CM

## 2023-12-26 DIAGNOSIS — Z7184 Encounter for health counseling related to travel: Secondary | ICD-10-CM

## 2023-12-26 DIAGNOSIS — Z23 Encounter for immunization: Secondary | ICD-10-CM

## 2023-12-26 MED ORDER — AZITHROMYCIN 250 MG PO TABS: ORAL_TABLET | ORAL | 0 refills | Status: AC

## 2023-12-26 MED ORDER — ATOVAQUONE-PROGUANIL HCL 250-100 MG PO TABS: 1.0000 | ORAL_TABLET | Freq: Every day | ORAL | 0 refills | Status: DC

## 2023-12-26 NOTE — Progress Notes (Signed)
Subjective:     Patient ID: Claudia Cantrell, female    DOB: 1946/10/02, 78 y.o.   MRN: [id number]  Chief Complaint  Patient presents with   Travel Consult    HPI   History of Present Illness         She plans to travel to [place] on 01/01/2024 and leave [place] on 01/07/2024.   Inquiring about immunizations.     Health Maintenance Due  Topic Date Due   DTaP/Tdap/Td (1 - Tdap) Never done   Fecal DNA (Cologuard)  Never done    Past Medical History:  Diagnosis Date   Arthritis    rheumatoid   Back pain    CAD (coronary artery disease)    a. 1992 s/p MI and PTCA of unknown vessel;  b. 09/2010 Cath: LM nl, LAD 20p, LCX 22m, RCA 46m, RPL 20.   Cancer [facility])    Cervical cancer (HCC) 1977   Chest pain    Chronic kidney disease    Constipation    Family history of anesthesia complication    daughter has difficulty waking    GERD (gastroesophageal reflux disease)    occ   Gout 10/08/2016   History of heart attack    Hyperlipidemia    Hypertensive heart disease    Hypothyroidism    Joint pain    Nonischemic cardiomyopathy (HCC)    a. 07/2016 Echo: EF 40-45%, mild LVH, inferior akinesis, moderately dilated left atrium, trivial AI and MR.   PAF (paroxysmal atrial fibrillation) (HCC)    a. 07/2016 Admitted w/ AF RVR-->CHA2DS2VASc = 5-->Eliquis;  b. 07/2016 successful TEE/DCCV.   Pre-diabetes    Sleep apnea    a. Using CPAP.   SOB (shortness of breath)    Vitamin D deficiency     Past Surgical History:  Procedure Laterality Date   ABDOMINAL HYSTERECTOMY  12/10/1986   ATRIAL FIBRILLATION ABLATION N/A 07/28/2021   Procedure: ATRIAL FIBRILLATION ABLATION;  Surgeon: Regan Lemming, MD;  Location: [facility];  Service: Cardiovascular;  Laterality: N/A;   ATRIAL FIBRILLATION ABLATION N/A 12/14/2022   Procedure: ATRIAL FIBRILLATION ABLATION;  Surgeon: Regan Lemming, MD;  Location: [facility];  Service: Cardiovascular;  Laterality: N/A;   BACK  SURGERY  12/10/1992   BIOPSY  09/02/2019   Procedure: BIOPSY;  Surgeon: Meridee Score Netty Starring., MD;  Location: [facility];  Service: Gastroenterology;;   BIOPSY  12/09/2019   Procedure: BIOPSY;  Surgeon: Lemar Lofty., MD;  Location: [facility];  Service: Gastroenterology;;   BIOPSY  03/08/2021   Procedure: BIOPSY;  Surgeon: Lemar Lofty., MD;  Location: [facility];  Service: Gastroenterology;;   BIOPSY  07/08/2023   Procedure: BIOPSY;  Surgeon: Lemar Lofty., MD;  Location: [facility];  Service: Gastroenterology;;   CARDIAC CATHETERIZATION  1991 AND 1992   PTCA BY DR Meryl Crutch   CARDIOVERSION N/A 08/01/2016   Procedure: CARDIOVERSION;  Surgeon: Lewayne Bunting, MD;  Location: [facility] ENDOS[facility];  Service: Cardiovascular;  Laterality: N/A;   CARDIOVERSION N/A 01/11/2020   Procedure: CARDIOVERSION;  Surgeon: Wendall Stade, MD;  Location: [facility] ENDOSCOPY;  Service: Cardiovascular;  Laterality: N/A;   CARDIOVERSION N/A 07/08/2020   Procedure: CARDIOVERSION;  Surgeon: Little Ishikawa, MD;  Location: [facility] ENDOSCOPY;  Service: Cardiovascular;  Laterality: N/A;   CARDIOVERSION N/A 05/31/2022   Procedure: CARDIOVERSION;  Surgeon: Chrystie Nose, MD;  Location: [facility] ENDOSCOPY;  Service: Cardiovascular;  Laterality: N/A;   CARDIOVERSION N/A 07/18/2022  Procedure: CARDIOVERSION;  Surgeon: Little Ishikawa, MD;  Location: [facility] ENDOSCOPY;  Service: Cardiovascular;  Laterality: N/A;   CATARACT EXTRACTION  2024   June/July 2024 bilateral eyes   COLONOSCOPY  07/2023   ENDOSCOPIC MUCOSAL RESECTION N/A 12/09/2019   Procedure: ENDOSCOPIC MUCOSAL RESECTION;  Surgeon: Lemar Lofty., MD;  Location: [facility] ENDOSCOPY;  Service: Gastroenterology;  Laterality: N/A;   ESOPHAGOGASTRODUODENOSCOPY N/A 03/08/2021   Procedure: ESOPHAGOGASTRODUODENOSCOPY (EGD);  Surgeon: Lemar Lofty., MD;  Location: [facility] ENDOSCOPY;  Service: Gastroenterology;  Laterality:  N/A;   ESOPHAGOGASTRODUODENOSCOPY (EGD) WITH PROPOFOL N/A 09/02/2019   Procedure: ESOPHAGOGASTRODUODENOSCOPY (EGD) WITH PROPOFOL;  Surgeon: Meridee Score Netty Starring., MD;  Location: [facility] ENDOSCOPY;  Service: Gastroenterology;  Laterality: N/A;   ESOPHAGOGASTRODUODENOSCOPY (EGD) WITH PROPOFOL N/A 12/09/2019   Procedure: ESOPHAGOGASTRODUODENOSCOPY (EGD) WITH PROPOFOL;  Surgeon: Meridee Score Netty Starring., MD;  Location: [facility] ENDOSCOPY;  Service: Gastroenterology;  Laterality: N/A;   ESOPHAGOGASTRODUODENOSCOPY (EGD) WITH PROPOFOL N/A 07/08/2023   Procedure: ESOPHAGOGASTRODUODENOSCOPY (EGD) WITH PROPOFOL;  Surgeon: Meridee Score Netty Starring., MD;  Location: [facility] ENDOSCOPY;  Service: Gastroenterology;  Laterality: N/A;   EUS N/A 09/02/2019   Procedure: UPPER ENDOSCOPIC ULTRASOUND (EUS) RADIAL;  Surgeon: Lemar Lofty., MD;  Location: [facility] ENDOSCOPY;  Service: Gastroenterology;  Laterality: N/A;  EUS radial/linear   EUS N/A 12/09/2019   Procedure: UPPER ENDOSCOPIC ULTRASOUND (EUS) RADIAL;  Surgeon: Lemar Lofty., MD;  Location: [facility] ENDOSCOPY;  Service: Gastroenterology;  Laterality: N/A;   EUS  12/09/2019   Procedure: UPPER ENDOSCOPIC ULTRASOUND (EUS) LINEAR;  Surgeon: Lemar Lofty., MD;  Location: [facility] ENDOSCOPY;  Service: Gastroenterology;;   EUS N/A 03/08/2021   Procedure: UPPER ENDOSCOPIC ULTRASOUND (EUS) RADIAL;  Surgeon: Lemar Lofty., MD;  Location: [facility] ENDOSCOPY;  Service: Gastroenterology;  Laterality: N/A;   FINE NEEDLE ASPIRATION  09/02/2019   Procedure: FINE NEEDLE ASPIRATION (FNA) LINEAR;  Surgeon: Lemar Lofty., MD;  Location: [facility] ENDOSCOPY;  Service: Gastroenterology;;   FINE NEEDLE ASPIRATION  12/09/2019   Procedure: FINE NEEDLE ASPIRATION (FNA) LINEAR;  Surgeon: Lemar Lofty., MD;  Location: [facility] ENDOSCOPY;  Service: Gastroenterology;;   HEMOSTASIS CLIP PLACEMENT  12/09/2019   Procedure: HEMOSTASIS CLIP PLACEMENT;  Surgeon: Lemar Lofty.,  MD;  Location: [facility] ENDOSCOPY;  Service: Gastroenterology;;   ORIF ANKLE FRACTURE Right 04/27/2015   Procedure: OPEN REDUCTION INTERNAL FIXATION (ORIF) RIGHT BIMALLEOLAR ANKLE FRACTURE WITH SYNDESMOSIS FIXATION;  Surgeon: Tarry Kos, MD;  Location: [facility] OR;  Service: Orthopedics;  Laterality: Right;   SUBMUCOSAL LIFTING INJECTION  12/09/2019   Procedure: SUBMUCOSAL LIFTING INJECTION;  Surgeon: Lemar Lofty., MD;  Location: [facility] ENDOSCOPY;  Service: Gastroenterology;;   TEE WITHOUT CARDIOVERSION N/A 08/01/2016   Procedure: TRANSESOPHAGEAL ECHOCARDIOGRAM (TEE);  Surgeon: Lewayne Bunting, MD;  Location: [facility] ENDOSCOPY;  Service: Cardiovascular;  Laterality: N/A;   THYROIDECTOMY  11/24/2013   DR Derrell Lolling   THYROIDECTOMY N/A 11/24/2013   Procedure: TOTAL THYROIDECTOMY;  Surgeon: Ernestene Mention, MD;  Location: [facility] OR;  Service: General;  Laterality: N/A;   TRANSTHORACIC ECHOCARDIOGRAM  01/15/2013   EF 55% TO 65%. PROBABLE MILD HYPOKINESIS OF THE INFERIOR MYOCARDIUM. GRADE 1 DIASTOLIC DYSFUNCTION. TRIAL AR.LA IS MILDLY DILATED.   UPPER ESOPHAGEAL ENDOSCOPIC ULTRASOUND (EUS) N/A 07/08/2023   Procedure: UPPER ESOPHAGEAL ENDOSCOPIC ULTRASOUND (EUS);  Surgeon: Lemar Lofty., MD;  Location: [facility];  Service: Gastroenterology;  Laterality: N/A;    Family History  Problem Relation Age of Onset   Heart disease Mother    Sudden death Mother    Diabetes Father  Stroke Father    Bone cancer Sister    Sudden death Brother    Melanoma Brother    Heart disease Brother    Colon cancer Neg Hx    Esophageal cancer Neg Hx    Inflammatory bowel disease Neg Hx    Liver disease Neg Hx    Pancreatic cancer Neg Hx    Rectal cancer Neg Hx    Stomach cancer Neg Hx     Social History   Socioeconomic History   Marital status: Widowed    Spouse name: Not on file   Number of children: 3   Years of education: Not on file   Highest education level: Not on file  Occupational History    Occupation: retired  Tobacco Use   Smoking status: Former    Current packs/day: 0.00    Average packs/day: 1 pack/day for 15.0 years (15.0 ttl pk-yrs)    Types: Cigarettes    Start date: 12/11/1975    Quit date: 12/10/1990    Years since quitting: 33.0    Passive exposure: Never   Smokeless tobacco: Never   Tobacco comments:    Former smoker 05/21/22  Vaping Use   Vaping status: Never Used  Substance and Sexual Activity   Alcohol use: Yes    Comment: very rare   Drug use: No   Sexual activity: Not Currently  Other Topics Concern   Not on file  Social History Narrative   Right handed    Living  alone.   Social Drivers of Corporate investment banker Strain: Low Risk  (06/21/2023)   Overall Financial Resource Strain (CARDIA)    Difficulty of Paying Living Expenses: Not very hard  Food Insecurity: No Food Insecurity (06/21/2023)   Hunger Vital Sign    Worried About Running Out of Food in the Last Year: Never true    Ran Out of Food in the Last Year: Never true  Transportation Needs: No Transportation Needs (06/21/2023)   PRAPARE - Administrator, Civil Service (Medical): No    Lack of Transportation (Non-Medical): No  Physical Activity: Insufficiently Active (06/21/2023)   Exercise Vital Sign    Days of Exercise per Week: 5 days    Minutes of Exercise per Session: 20 min  Stress: No Stress Concern Present (06/21/2023)   [employer] of Occupational Health - Occupational Stress Questionnaire    Feeling of Stress : Not at all  Social Connections: Socially Isolated (06/21/2023)   Social Connection and Isolation Panel [NHANES]    Frequency of Communication with Friends and Family: Twice a week    Frequency of Social Gatherings with Friends and Family: Twice a week    Attends Religious Services: Never    Database administrator or Organizations: No    Attends Banker Meetings: Never    Marital Status: Widowed  Intimate Partner Violence: Not At Risk  (06/21/2023)   Humiliation, Afraid, Rape, and Kick questionnaire    Fear of Current or Ex-Partner: No    Emotionally Abused: No    Physically Abused: No    Sexually Abused: No    Outpatient Medications Prior to Visit  Medication Sig Dispense Refill   acetaminophen (TYLENOL) 500 MG tablet Take 1,000 mg by mouth every 6 (six) hours as needed for moderate pain.     allopurinol (ZYLOPRIM) 100 MG tablet Take 1 tablet (100 mg total) by mouth daily. 30 tablet 2   amLODipine (NORVASC) 5 MG tablet TAKE 1 &  1/2 (ONE & ONE-HALF) TABLETS BY MOUTH IN THE EVENING 135 tablet 0   apixaban (ELIQUIS) 5 MG TABS tablet Take 1 tablet by mouth twice daily 180 tablet 1   carvedilol (COREG) 12.5 MG tablet TAKE 1 TABLET BY MOUTH TWICE DAILY WITH A MEAL 180 tablet 0   cyanocobalamin (VITAMIN B12) 1000 MCG tablet Take 1,000 mcg by mouth daily.     dapagliflozin propanediol (FARXIGA) 10 MG TABS tablet Take 10 mg by mouth in the morning.     furosemide (LASIX) 20 MG tablet Take 20 mg by mouth daily as needed for edema.     isosorbide dinitrate (ISORDIL) 5 MG tablet Take 1 tablet by mouth twice daily 180 tablet 3   Multiple Vitamins-Minerals (OCUVITE ADULT 50+ PO) Take 1 tablet by mouth daily.     olmesartan (BENICAR) 40 MG tablet Take 40 mg by mouth daily.     Probiotic Product (PROBIOTIC PO) Take 1 capsule by mouth 2 (two) times a week.     rosuvastatin (CRESTOR) 40 MG tablet Take 1 tablet by mouth once daily 90 tablet 3   SYNTHROID 112 MCG tablet Take 1 tablet (112 mcg total) by mouth daily before breakfast. 90 tablet 2   OZEMPIC, 0.25 OR 0.5 MG/DOSE, 2 MG/3ML SOPN Inject 0.5 mg into the skin once a week. (Patient not taking: Reported on 12/26/2023)     No facility-administered medications prior to visit.    Allergies  Allergen Reactions   Labetalol     headaches   Codeine Anxiety    Review of Systems  Constitutional:  Negative for chills and fever.  Respiratory:  Negative for shortness of breath.    Cardiovascular:  Negative for chest pain and palpitations.  Gastrointestinal:  Negative for abdominal pain, constipation, diarrhea, nausea and vomiting.  Neurological:  Negative for dizziness and focal weakness.       Objective:    Physical Exam Constitutional:      General: She is not in acute distress.    Appearance: She is not ill-appearing.  Eyes:     Extraocular Movements: Extraocular movements intact.     Conjunctiva/sclera: Conjunctivae normal.  Cardiovascular:     Rate and Rhythm: Normal rate.  Pulmonary:     Effort: Pulmonary effort is normal.  Musculoskeletal:     Cervical back: Normal range of motion and neck supple.  Skin:    General: Skin is warm and dry.  Neurological:     General: No focal deficit present.     Mental Status: She is alert and oriented to person, place, and time.  Psychiatric:        Mood and Affect: Mood normal.        Behavior: Behavior normal.        Thought Content: Thought content normal.      BP 136/82 (BP Location: Left Arm, Patient Position: Sitting, Cuff Size: Large)   Pulse (!) 56   Temp 97.6 F (36.4 C) (Temporal)   Ht 5\' 5"  (1.651 m)   Wt 199 lb (90.3 kg)   SpO2 97%   BMI 33.12 kg/m  Wt Readings from Last 3 Encounters:  12/26/23 199 lb (90.3 kg)  11/21/23 198 lb 9.6 oz (90.1 kg)  09/27/23 194 lb (88 kg)       Assessment & Plan:   Problem List Items Addressed This Visit   None Visit Diagnoses       Travel advice encounter    -  Primary   Relevant Medications  azithromycin (ZITHROMAX) 250 MG tablet     Immunization due         Need for malaria prophylaxis       Relevant Medications   atovaquone-proguanil (MALARONE) 250-100 MG TABS tablet     Need for pneumococcal 20-valent conjugate vaccination       Relevant Orders   Pneumococcal conjugate vaccine 20-valent (Prevnar 20) (Completed)      Reviewed CDC required and recommended guidelines with patient. She is leaving in the next 7 days. Prevnar 20 given  today. Advised her to go to her pharmacy for Tdap and to check on yellow fever vaccine. Hep A UTD.  Malarone prescribed and counseling on how to take the medication and potential side effects. Azithromycin prescribe in case of GI illness on her trip.  Discussed using mosquito repellant, mosquito nets and covering her skin to prevent insect bites. Advised to avoid drinking tap water and ice while there and avoiding fresh fruits and salads.     I am having Claudia Cantrell start on atovaquone-proguanil and azithromycin. I am also having her maintain her dapagliflozin propanediol, Probiotic Product (PROBIOTIC PO), furosemide, acetaminophen, allopurinol, olmesartan, cyanocobalamin, Multiple Vitamins-Minerals (OCUVITE ADULT 50+ PO), Ozempic (0.25 or 0.5 MG/DOSE), rosuvastatin, Synthroid, isosorbide dinitrate, amLODipine, carvedilol, and Eliquis.  Meds ordered this encounter  Medications   atovaquone-proguanil (MALARONE) 250-100 MG TABS tablet    Sig: Take 1 tablet by mouth daily.    Dispense:  17 tablet    Refill:  0    Supervising Provider:   Hillard Danker A [[id number]]   azithromycin (ZITHROMAX) 250 MG tablet    Sig: Take 2 tablets on day 1, then 1 tablet daily on days 2 through 5    Dispense:  6 tablet    Refill:  0    Supervising Provider:   Hillard Danker A [[id number]]

## 2023-12-26 NOTE — Patient Instructions (Addendum)
Please check with your pharmacy regarding yellow fever vaccine for [place].   You should get a Tdap vaccine today or tomorrow at your pharmacy  You should also check on getting a polio booster.   I will send in antimalaria pill that you will need to take 2 days prior to landing in [place], once daily while you are there and for 7 days when you return. Take it with food to keep from upsetting your stomach.   Take mosquito repellent with you. Avoid getting mosquito bites!  Do not drink the water or use it for brushing your teeth. Do not get tap water in your mouth when you shower. Use bottle water only.

## 2024-01-01 ENCOUNTER — Ambulatory Visit: Payer: Medicare PPO | Admitting: Family Medicine

## 2024-01-26 NOTE — Assessment & Plan Note (Signed)
[id number]  -incidental finding on CT AP 05/07/19 during work up for proteinuria. EUS by Dr. Meridee Score on 09/02/19 showed a 9 mm submucosal papule on greater curvature of gastric body. Resection was planned but aborted due to patient developing arrhythmia. Cytology from the mass was nondiagnostic, biopsy was positive for H.pylori. -repeat EUS 12/09/19 showed a 14.4 mm subepithelial lesion in body of stomach; pathology revealed spindle cell neoplasm, consistent with GIST. She went on surveillance.  Plan is to alternate EUS and CT scan every other year  -surveillance EGD/EUS 03/08/21 and 06/2023 showed no significant change in size of GIST lesion.

## 2024-01-27 ENCOUNTER — Other Ambulatory Visit: Payer: Self-pay | Admitting: *Deleted

## 2024-01-27 ENCOUNTER — Encounter: Payer: Self-pay | Admitting: Hematology

## 2024-01-27 ENCOUNTER — Inpatient Hospital Stay: Payer: 59 | Attending: Hematology | Admitting: Hematology

## 2024-01-27 VITALS — BP 147/77 | HR 72 | Temp 97.5°F | Resp 16 | Wt 201.1 lb

## 2024-01-27 DIAGNOSIS — Z8541 Personal history of malignant neoplasm of cervix uteri: Secondary | ICD-10-CM | POA: Insufficient documentation

## 2024-01-27 DIAGNOSIS — C49A2 Gastrointestinal stromal tumor of stomach: Secondary | ICD-10-CM

## 2024-01-27 NOTE — Progress Notes (Signed)
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 EUS by Dr. Meridee Score on 09/02/19 showed a 9 mm submucosal papule on greater curvature of gastric body. Resection was planned but aborted due to patient developing arrhythmia. Cytology from the mass was nondiagnostic, biopsy was positive for H.pylori. -repeat EUS 12/09/19 showed a 14.4 mm subepithelial lesion in body of stomach; pathology revealed spindle cell neoplasm, consistent with GIST. She went on surveillance.  Plan is to alternate EUS and CT scan every other year  -surveillance EGD/EUS 03/08/21 and 06/2023 showed no significant change in size of GIST lesion.   Assessment and Plan    Gastrointestinal Stromal Tumor (GIST) Follow-up for GIST. Tumor well-managed in past two endoscopies, last in July 2024. Due for CT scan as it has been almost two years since the last scan. No tenderness or palpable nodules on physical examination. Discussed the importance of regular monitoring and potential need for future endoscopies or scans. Stable tumors may not require immediate intervention but need consistent follow-up to detect changes early. - Order CT scan in the next 2-3 weeks to monitor tumor status - Coordinate with Dr. Meridee Score for future follow-ups and management, I will likely see her as needed     Discussed the use of AI scribe software for clinical note transcription with the patient, who gave verbal consent to proceed.  History of Present Illness   Claudia Cantrell, a 78 year old female  with a history of Gastrointestinal Stromal Tumor (GIST), presents for a routine follow-up. She reports no new symptoms related to the GIST. Her last endoscopy to examine the tumor was in July 2024, and the tumor has remained stable since then. She underwent a colonoscopy performed by a gastroenterologist, which came back negative. She also had cataract surgery on both eyes. She expresses concern about muscle tone loss in her leg and is considering joining a gym to improve her strength. She has also noticed weight gain, which she attributes to a change in her medication regimen by her kidney specialist. She has been unable to get approval for a weight loss medication from her insurance. She recently traveled to [place] and [place], which she enjoyed.       All other systems were reviewed with the patient and are negative.  MEDICAL HISTORY:  Past Medical History:  Diagnosis Date   Arthritis    rheumatoid   Back pain    CAD (coronary artery disease)    a. 1992 s/p MI and PTCA of unknown vessel;  b. 09/2010 Cath: LM nl, LAD 20p, LCX 72m, RCA 50m, RPL 20.   Cancer [facility])    Cervical cancer (HCC) 1977   Chest pain    Chronic kidney disease    Constipation    Family history of anesthesia complication    daughter has difficulty waking    GERD (gastroesophageal reflux disease)    occ   Gout 10/08/2016   History of heart attack    Hyperlipidemia    Hypertensive heart disease    Hypothyroidism    Joint pain  Nonischemic cardiomyopathy (HCC)    a. 07/2016 Echo: EF 40-45%, mild LVH, inferior akinesis, moderately dilated left atrium, trivial AI and MR.   PAF (paroxysmal atrial fibrillation) (HCC)    a. 07/2016 Admitted w/ AF RVR-->CHA2DS2VASc = 5-->Eliquis;  b. 07/2016 successful TEE/DCCV.   Pre-diabetes    Sleep apnea    a. Using CPAP.   SOB (shortness of breath)    Vitamin D deficiency     SURGICAL HISTORY: Past Surgical History:  Procedure Laterality Date   ABDOMINAL HYSTERECTOMY   12/10/1986   ATRIAL FIBRILLATION ABLATION N/A 07/28/2021   Procedure: ATRIAL FIBRILLATION ABLATION;  Surgeon: Regan Lemming, MD;  Location: [facility];  Service: Cardiovascular;  Laterality: N/A;   ATRIAL FIBRILLATION ABLATION N/A 12/14/2022   Procedure: ATRIAL FIBRILLATION ABLATION;  Surgeon: Regan Lemming, MD;  Location: [facility] CV [facility];  Service: Cardiovascular;  Laterality: N/A;   BACK SURGERY  12/10/1992   BIOPSY  09/02/2019   Procedure: BIOPSY;  Surgeon: Meridee Score Netty Starring., MD;  Location: [facility];  Service: Gastroenterology;;   BIOPSY  12/09/2019   Procedure: BIOPSY;  Surgeon: Lemar Lofty., MD;  Location: [facility]COPY;  Service: Gastroenterology;;   BIOPSY  03/08/2021   Procedure: BIOPSY;  Surgeon: Lemar Lofty., MD;  Location: [facility];  Service: Gastroenterology;;   BIOPSY  07/08/2023   Procedure: BIOPSY;  Surgeon: Lemar Lofty., MD;  Location: [facility]COPY;  Service: Gastroenterology;;   CARDIAC CATHETERIZATION  1991 AND 1992   PTCA BY DR Meryl Crutch   CARDIOVERSION N/A 08/01/2016   Procedure: CARDIOVERSION;  Surgeon: Lewayne Bunting, MD;  Location: [facility] ENDOSCOPY;  Service: Cardiovascular;  Laterality: N/A;   CARDIOVERSION N/A 01/11/2020   Procedure: CARDIOVERSION;  Surgeon: Wendall Stade, MD;  Location: [facility] ENDOSCOPY;  Service: Cardiovascular;  Laterality: N/A;   CARDIOVERSION N/A 07/08/2020   Procedure: CARDIOVERSION;  Surgeon: Little Ishikawa, MD;  Location: [facility] ENDOSCOPY;  Service: Cardiovascular;  Laterality: N/A;   CARDIOVERSION N/A 05/31/2022   Procedure: CARDIOVERSION;  Surgeon: Chrystie Nose, MD;  Location: [facility] ENDOSCOPY;  Service: Cardiovascular;  Laterality: N/A;   CARDIOVERSION N/A 07/18/2022   Procedure: CARDIOVERSION;  Surgeon: Little Ishikawa, MD;  Location: [facility] ENDOSCOPY;  Service: Cardiovascular;  Laterality: N/A;   CATARACT EXTRACTION  2024   June/July 2024 bilateral eyes    COLONOSCOPY  07/2023   ENDOSCOPIC MUCOSAL RESECTION N/A 12/09/2019   Procedure: ENDOSCOPIC MUCOSAL RESECTION;  Surgeon: Lemar Lofty., MD;  Location: [facility] ENDOSCOPY;  Service: Gastroenterology;  Laterality: N/A;   ESOPHAGOGASTRODUODENOSCOPY N/A 03/08/2021   Procedure: ESOPHAGOGASTRODUODENOSCOPY (EGD);  Surgeon: Lemar Lofty., MD;  Location: [facility] ENDOSCOPY;  Service: Gastroenterology;  Laterality: N/A;   ESOPHAGOGASTRODUODENOSCOPY (EGD) WITH PROPOFOL N/A 09/02/2019   Procedure: ESOPHAGOGASTRODUODENOSCOPY (EGD) WITH PROPOFOL;  Surgeon: Meridee Score Netty Starring., MD;  Location: [facility] ENDOSCOPY;  Service: Gastroenterology;  Laterality: N/A;   ESOPHAGOGASTRODUODENOSCOPY (EGD) WITH PROPOFOL N/A 12/09/2019   Procedure: ESOPHAGOGASTRODUODENOSCOPY (EGD) WITH PROPOFOL;  Surgeon: Meridee Score Netty Starring., MD;  Location: [facility] ENDOSCOPY;  Service: Gastroenterology;  Laterality: N/A;   ESOPHAGOGASTRODUODENOSCOPY (EGD) WITH PROPOFOL N/A 07/08/2023   Procedure: ESOPHAGOGASTRODUODENOSCOPY (EGD) WITH PROPOFOL;  Surgeon: Meridee Score Netty Starring., MD;  Location: [facility] ENDOSCOPY;  Service: Gastroenterology;  Laterality: N/A;   EUS N/A 09/02/2019   Procedure: UPPER ENDOSCOPIC ULTRASOUND (EUS) RADIAL;  Surgeon: Lemar Lofty., MD;  Location: [facility] ENDOSCOPY;  Service: Gastroenterology;  Laterality: N/A;  EUS radial/linear   EUS N/A 12/09/2019   Procedure: UPPER ENDOSCOPIC ULTRASOUND (EUS) RADIAL;  Surgeon: Lemar Lofty., MD;  Location: [facility] ENDOSCOPY;  Service: Gastroenterology;  Laterality: N/A;   EUS  12/09/2019   Procedure: UPPER ENDOSCOPIC ULTRASOUND (EUS) LINEAR;  Surgeon: Lemar Lofty., MD;  Location: [facility] ENDOSCOPY;  Service: Gastroenterology;;   EUS N/A 03/08/2021   Procedure: UPPER ENDOSCOPIC ULTRASOUND (EUS) RADIAL;  Surgeon: Lemar Lofty., MD;  Location: [facility] ENDOSCOPY;  Service: Gastroenterology;  Laterality: N/A;   FINE NEEDLE ASPIRATION  09/02/2019   Procedure: FINE  NEEDLE ASPIRATION (FNA) LINEAR;  Surgeon: Lemar Lofty., MD;  Location: [facility] ENDOSCOPY;  Service: Gastroenterology;;   FINE NEEDLE ASPIRATION  12/09/2019   Procedure: FINE NEEDLE ASPIRATION (FNA) LINEAR;  Surgeon: Lemar Lofty., MD;  Location: [facility] ENDOSCOPY;  Service: Gastroenterology;;   HEMOSTASIS CLIP PLACEMENT  12/09/2019   Procedure: HEMOSTASIS CLIP PLACEMENT;  Surgeon: Lemar Lofty., MD;  Location: [facility] ENDOSCOPY;  Service: Gastroenterology;;   ORIF ANKLE FRACTURE Right 04/27/2015   Procedure: OPEN REDUCTION INTERNAL FIXATION (ORIF) RIGHT BIMALLEOLAR ANKLE FRACTURE WITH SYNDESMOSIS FIXATION;  Surgeon: Tarry Kos, MD;  Location: [facility] OR;  Service: Orthopedics;  Laterality: Right;   SUBMUCOSAL LIFTING INJECTION  12/09/2019   Procedure: SUBMUCOSAL LIFTING INJECTION;  Surgeon: Lemar Lofty., MD;  Location: [facility] ENDOSCOPY;  Service: Gastroenterology;;   TEE WITHOUT CARDIOVERSION N/A 08/01/2016   Procedure: TRANSESOPHAGEAL ECHOCARDIOGRAM (TEE);  Surgeon: Lewayne Bunting, MD;  Location: [facility] ENDOSCOPY;  Service: Cardiovascular;  Laterality: N/A;   THYROIDECTOMY  11/24/2013   DR Derrell Lolling   THYROIDECTOMY N/A 11/24/2013   Procedure: TOTAL THYROIDECTOMY;  Surgeon: Ernestene Mention, MD;  Location: [facility] OR;  Service: General;  Laterality: N/A;   TRANSTHORACIC ECHOCARDIOGRAM  01/15/2013   EF 55% TO 65%. PROBABLE MILD HYPOKINESIS OF THE INFERIOR MYOCARDIUM. GRADE 1 DIASTOLIC DYSFUNCTION. TRIAL AR.LA IS MILDLY DILATED.   UPPER ESOPHAGEAL ENDOSCOPIC ULTRASOUND (EUS) N/A 07/08/2023   Procedure: UPPER ESOPHAGEAL ENDOSCOPIC ULTRASOUND (EUS);  Surgeon: Lemar Lofty., MD;  Location: [facility];  Service: Gastroenterology;  Laterality: N/A;    I have reviewed the social history and family history with the patient and they are unchanged from previous note.  ALLERGIES:  is allergic to labetalol and codeine.  MEDICATIONS:  Current Outpatient Medications  Medication Sig  Dispense Refill   acetaminophen (TYLENOL) 500 MG tablet Take 1,000 mg by mouth every 6 (six) hours as needed for moderate pain.     allopurinol (ZYLOPRIM) 100 MG tablet Take 1 tablet (100 mg total) by mouth daily. 30 tablet 2   amLODipine (NORVASC) 5 MG tablet TAKE 1 & 1/2 (ONE & ONE-HALF) TABLETS BY MOUTH IN THE EVENING 135 tablet 0   apixaban (ELIQUIS) 5 MG TABS tablet Take 1 tablet by mouth twice daily 180 tablet 1   atovaquone-proguanil (MALARONE) 250-100 MG TABS tablet Take 1 tablet by mouth daily. 17 tablet 0   carvedilol (COREG) 12.5 MG tablet TAKE 1 TABLET BY MOUTH TWICE DAILY WITH A MEAL 180 tablet 0   cyanocobalamin (VITAMIN B12) 1000 MCG tablet Take 1,000 mcg by mouth daily.     dapagliflozin propanediol (FARXIGA) 10 MG TABS tablet Take 10 mg by mouth in the morning.     furosemide (LASIX) 20 MG tablet Take 20 mg by mouth daily as needed for edema.     isosorbide dinitrate (ISORDIL) 5 MG tablet Take 1 tablet by mouth twice daily 180 tablet 3   Multiple Vitamins-Minerals (OCUVITE ADULT 50+ PO) Take 1 tablet by mouth daily.     olmesartan (BENICAR) 40 MG tablet  Take 40 mg by mouth daily.     OZEMPIC, 0.25 OR 0.5 MG/DOSE, 2 MG/3ML SOPN Inject 0.5 mg into the skin once a week. (Patient not taking: Reported on 12/26/2023)     Probiotic Product (PROBIOTIC PO) Take 1 capsule by mouth 2 (two) times a week.     rosuvastatin (CRESTOR) 40 MG tablet Take 1 tablet by mouth once daily 90 tablet 3   SYNTHROID 112 MCG tablet Take 1 tablet (112 mcg total) by mouth daily before breakfast. 90 tablet 2   No current facility-administered medications for this visit.    PHYSICAL EXAMINATION: ECOG PERFORMANCE STATUS: 0 - Asymptomatic  Vitals:   01/27/24 1125  BP: (!) 147/77  Pulse: 72  Resp: 16  Temp: (!) 97.5 F (36.4 C)  SpO2: 100%   Wt Readings from Last 3 Encounters:  01/27/24 201 lb 1.6 oz (91.2 kg)  12/26/23 199 lb (90.3 kg)  11/21/23 198 lb 9.6 oz (90.1 kg)     GENERAL:alert, no  distress and comfortable SKIN: skin color, texture, turgor are normal, no rashes or significant lesions EYES: normal, Conjunctiva are pink and non-injected, sclera clear NECK: supple, thyroid normal size, non-tender, without nodularity LYMPH:  no palpable lymphadenopathy in the cervical, axillary  LUNGS: clear to auscultation and percussion with normal breathing effort HEART: regular rate & rhythm and no murmurs and no lower extremity edema ABDOMEN:abdomen soft, non-tender and normal bowel sounds Musculoskeletal:no cyanosis of digits and no clubbing  NEURO: alert & oriented x 3 with fluent speech, no focal motor/sensory deficits   LABORATORY DATA:  I have reviewed the data as listed    Latest Ref Rng & Units 09/27/2023    2:45 PM 09/06/2023   11:46 AM 12/11/2022   12:04 PM  CBC  WBC 4.0 - 10.5 K/uL 3.4  4.9  3.7   Hemoglobin 12.0 - 15.0 g/dL 13.2  44.0  10.2   Hematocrit 36.0 - 46.0 % 49.7  49.1  50.2   Platelets 150.0 - 400.0 K/uL 203.0  174.0  213         Latest Ref Rng & Units 09/27/2023    2:45 PM 09/06/2023   11:46 AM 12/11/2022   12:04 PM  CMP  Glucose 70 - 99 mg/dL 83  82  82   BUN 6 - 23 mg/dL 19  13  13    Creatinine 0.40 - 1.20 mg/dL 7.25  3.66  4.40   Sodium 135 - 145 mEq/L 142  140  147   Potassium 3.5 - 5.1 mEq/L 3.9  4.0  4.0   Chloride 96 - 112 mEq/L 103  106  107   CO2 19 - 32 mEq/L 30  26  29    Calcium 8.4 - 10.5 mg/dL 34.7  9.5  9.7   Total Protein 6.0 - 8.3 g/dL 8.3  7.5  8.0   Total Bilirubin 0.2 - 1.2 mg/dL 0.8  0.7  0.7   Alkaline Phos 39 - 117 U/L 92  77    AST 0 - 37 U/L 20  16  19    ALT 0 - 35 U/L 15  11  18        RADIOGRAPHIC STUDIES: I have personally reviewed the radiological images as listed and agreed with the findings in the report. No results found.    Orders Placed This Encounter  Procedures   CT ABDOMEN PELVIS W CONTRAST    Standing Status:   Future    Expected Date:   02/10/2024  Expiration Date:   01/26/2025    If indicated for  the ordered procedure, I authorize the administration of contrast media per Radiology protocol:   Yes    Does the patient have a contrast media/X-ray dye allergy?:   No    Preferred imaging location?:   [facility]    Release to patient:   Immediate [1]    If indicated for the ordered procedure, I authorize the administration of oral contrast media per Radiology protocol:   Yes   All questions were answered. The patient knows to call the clinic with any problems, questions or concerns. No barriers to learning was detected. The total time spent in the appointment was 20 minutes.     Malachy Mood, MD 01/27/2024

## 2024-02-10 ENCOUNTER — Ambulatory Visit (HOSPITAL_COMMUNITY)
Admission: RE | Admit: 2024-02-10 | Discharge: 2024-02-10 | Disposition: A | Discharge status: Home / Self Care | Payer: 59 | Source: Ambulatory Visit | Attending: Hematology | Admitting: Hematology

## 2024-02-10 ENCOUNTER — Encounter (HOSPITAL_COMMUNITY): Payer: Self-pay

## 2024-02-10 DIAGNOSIS — N182 Chronic kidney disease, stage 2 (mild): Secondary | ICD-10-CM | POA: Insufficient documentation

## 2024-02-10 DIAGNOSIS — C49A2 Gastrointestinal stromal tumor of stomach: Secondary | ICD-10-CM | POA: Diagnosis present

## 2024-02-10 LAB — POCT I-STAT CREATININE: Creatinine, Ser: 1.2 mg/dL — ABNORMAL HIGH (ref 0.44–1.00)

## 2024-02-10 MED ORDER — IOHEXOL 300 MG/ML  SOLN
100.0000 mL | Freq: Once | INTRAMUSCULAR | Status: AC | PRN
  Administered 2024-02-10: 100 mL via INTRAVENOUS

## 2024-02-13 ENCOUNTER — Other Ambulatory Visit: Payer: Self-pay | Admitting: Cardiology

## 2024-02-15 ENCOUNTER — Other Ambulatory Visit: Payer: Self-pay | Admitting: Family Medicine

## 2024-02-15 ENCOUNTER — Other Ambulatory Visit: Payer: Self-pay | Admitting: Cardiology

## 2024-02-21 ENCOUNTER — Telehealth: Payer: Self-pay

## 2024-02-21 NOTE — Telephone Encounter (Signed)
 EUS recall has been entered as ordered

## 2024-02-21 NOTE — Telephone Encounter (Signed)
-----   Message from [facility] sent at 02/21/2024 12:53 PM EDT ----- YF, Thanks for forwarding me this. That sounds like a good plan. I am going to plan a 2-year upper EUS.  Sweetie Giebler, Placed 2-year upper EUS recall history of GIST. Thanks. GM ----- Message ----- From: Malachy Mood, MD Sent: 02/21/2024  11:09 AM EDT To: Lemar Lofty., MD  Gabe,  See her CT resport below. Stable GIST. If you plan to follow her, then I will see her as needed.  Thanks   Malachy Mood ----- Message ----- From: Leory Plowman, Rad Results In Sent: 02/16/2024   1:57 PM EDT To: Malachy Mood, MD

## 2024-03-02 ENCOUNTER — Encounter (INDEPENDENT_AMBULATORY_CARE_PROVIDER_SITE_OTHER): Payer: Self-pay

## 2024-03-02 ENCOUNTER — Ambulatory Visit: Payer: Medicare PPO | Admitting: Internal Medicine

## 2024-03-02 ENCOUNTER — Encounter: Payer: Self-pay | Admitting: Internal Medicine

## 2024-03-02 VITALS — BP 112/70 | HR 78 | Ht 65.0 in | Wt 198.6 lb

## 2024-03-02 DIAGNOSIS — R635 Abnormal weight gain: Secondary | ICD-10-CM

## 2024-03-02 DIAGNOSIS — R7303 Prediabetes: Secondary | ICD-10-CM | POA: Diagnosis not present

## 2024-03-02 DIAGNOSIS — E890 Postprocedural hypothyroidism: Secondary | ICD-10-CM | POA: Diagnosis not present

## 2024-03-02 MED ORDER — OZEMPIC (0.25 OR 0.5 MG/DOSE) 2 MG/3ML ~~LOC~~ SOPN: 0.5000 mg | PEN_INJECTOR | SUBCUTANEOUS | 3 refills | Status: DC

## 2024-03-02 NOTE — Progress Notes (Unsigned)
 Name: Claudia Cantrell  MRN/ DOB: [id number], [birth year]    Age/ Sex: 78 y.o., female    PCP: Avanell Shackleton, NP-C   Reason for Endocrinology Evaluation: Hypothyroidism     Date of Initial Endocrinology Evaluation: 03/02/2024     HPI: Claudia Cantrell is a 78 y.o. female with a past medical history of postoperative hypothyroidism, HTN, dyslipidemia and A.Fib . The patient presented for initial endocrinology clinic visit on 03/02/2024 for consultative assistance with her Hypothyroidism.   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    She is s/p EGD with [facility] 06/2023, she follows with oncology She continues to follow-up with cardiology,  amiodarone  was started in June 2023 She is S/P cardio ablation 12/2022 She continues to follow-up with rheumatology for gout  She continues to be on Ozempic through nephrology which was started 01/2023  Continues with occasional chills Has been off Ozempic due to lack of insurance coverage, has noted weight regai and sob   Denies local neck swelling  Denies constipation or diarrhea    Synthroid 112 mcg, half on Sundays and 1 tab rest of the week - takes one tablet daily      HISTORY:  Past Medical History:  Past Medical History:  Diagnosis Date   Arthritis    rheumatoid   Back pain    CAD (coronary artery disease)    a. 1992 s/p MI and PTCA of unknown vessel;  b. 09/2010 Cath: LM nl, LAD 20p, LCX 63m, RCA 61m, RPL 20.   Cancer [facility] 2)    Cervical cancer (HCC) 1977   Chest pain    Chronic kidney disease    Constipation    Family history of anesthesia complication    daughter has difficulty waking     GERD (gastroesophageal reflux disease)    occ   Gout 10/08/2016   History of heart attack    Hyperlipidemia    Hypertensive heart disease    Hypothyroidism    Joint pain    Nonischemic cardiomyopathy (HCC)    a. 07/2016 Echo: EF 40-45%, mild LVH, inferior akinesis, moderately dilated left atrium, trivial AI and MR.   PAF (paroxysmal atrial fibrillation) (HCC)    a. 07/2016 Admitted w/ AF RVR-->CHA2DS2VASc = 5-->Eliquis;  b. 07/2016 successful TEE/DCCV.   Pre-diabetes    Sleep apnea    a. Using CPAP.   SOB (shortness of breath)    Vitamin D deficiency    Past Surgical History:  Past Surgical History:  Procedure Laterality Date   ABDOMINAL HYSTERECTOMY  12/10/1986   ATRIAL FIBRILLATION ABLATION N/A 07/28/2021   Procedure: ATRIAL FIBRILLATION ABLATION;  Surgeon: Regan Lemming, MD;  [facility]: [facility];  Service: Cardiovascular;  Laterality: N/A;   ATRIAL FIBRILLATION ABLATION N/A 12/14/2022   Procedure: ATRIAL FIBRILLATION ABLATION;  Surgeon: Regan Lemming, MD;  Location: [facility];  Service: Cardiovascular;  Laterality: N/A;   BACK SURGERY  12/10/1992   BIOPSY  09/02/2019   Procedure: BIOPSY;  Surgeon: Meridee Score Netty Starring., MD;  Location: [facility] ENDOS[facility];  Service: Gastroenterology;;   BIOPSY  12/09/2019   Procedure: BIOPSY;  Surgeon: Lemar Lofty., MD;  Location: [facility] ENDOSCOPY;  Service: Gastroenterology;;   BIOPSY  03/08/2021   Procedure: BIOPSY;  Surgeon: Lemar Lofty., MD;  Location: [facility] ENDOSCOPY;  Service: Gastroenterology;;   BIOPSY  07/08/2023   Procedure: BIOPSY;  Surgeon: Lemar Lofty., MD;  Location: [facility] ENDOSCOPY;  Service: Gastroenterology;;   CARDIAC CATHETERIZATION  1991 AND 1992   PTCA BY DR Meryl Crutch   CARDIOVERSION N/A 08/01/2016   Procedure: CARDIOVERSION;  Surgeon: Lewayne Bunting, MD;  Location: [facility] ENDOSCOPY;  Service: Cardiovascular;  Laterality: N/A;   CARDIOVERSION N/A 01/11/2020   Procedure:  CARDIOVERSION;  Surgeon: Wendall Stade, MD;  Location: [facility] ENDOSCOPY;  Service: Cardiovascular;  Laterality: N/A;   CARDIOVERSION N/A 07/08/2020   Procedure: CARDIOVERSION;  Surgeon: Little Ishikawa, MD;  Location: [facility] ENDOSCOPY;  Service: Cardiovascular;  Laterality: N/A;   CARDIOVERSION N/A 05/31/2022   Procedure: CARDIOVERSION;  Surgeon: Chrystie Nose, MD;  Location: [facility] ENDOSCOPY;  Service: Cardiovascular;  Laterality: N/A;   CARDIOVERSION N/A 07/18/2022   Procedure: CARDIOVERSION;  Surgeon: Little Ishikawa, MD;  Location: [facility] ENDOSCOPY;  Service: Cardiovascular;  Laterality: N/A;   CATARACT EXTRACTION  2024   June/July 2024 bilateral eyes   COLONOSCOPY  07/2023   ENDOSCOPIC MUCOSAL RESECTION N/A 12/09/2019   Procedure: ENDOSCOPIC MUCOSAL RESECTION;  Surgeon: Lemar Lofty., MD;  Location: [facility]OS[facility];  Service: Gastroenterology;  Laterality: N/A;   ESOPHAGOGASTRODUODENOSCOPY N/A 03/08/2021   Procedure: ESOPHAGOGASTRODUODENOSCOPY (EGD);  Surgeon: Lemar Lofty., MD;  Location: [facility] ENDOSCOPY;  Service: Gastroenterology;  Laterality: N/A;   ESOPHAGOGASTRODUODENOSCOPY (EGD) WITH PROPOFOL N/A 09/02/2019   Procedure: ESOPHAGOGASTRODUODENOSCOPY (EGD) WITH PROPOFOL;  Surgeon: Meridee Score Netty Starring., MD;  Location: [facility]OS[facility];  Service: Gastroenterology;  Laterality: N/A;   ESOPHAGOGASTRODUODENOSCOPY (EGD) WITH PROPOFOL N/A 12/09/2019   Procedure: ESOPHAGOGASTRODUODENOSCOPY (EGD) WITH PROPOFOL;  Surgeon: Meridee Score Netty Starring., MD;  [facility]OS[facility];  Service: Gastroenterology;  Laterality: N/A;   ESOPHAGOGASTRODUODENOSCOPY (EGD) WITH PROPOFOL N/A 07/08/2023   Procedure: ESOPHAGOGASTRODUODENOSCOPY (EGD) WITH PROPOFOL;  Surgeon: Meridee Score Netty Starring., MD;  Location: [facility];  Service: Gastroenterology;  Laterality: N/A;   EUS N/A 09/02/2019   Procedure: UPPER ENDOSCOPIC ULTRASOUND (EUS) RADIAL;  Surgeon: Lemar Lofty., MD;  Location: [facility]  ENDOSCOPY;  Service: Gastroenterology;  Laterality: N/A;  EUS radial/linear   EUS N/A 12/09/2019   Procedure: UPPER ENDOSCOPIC ULTRASOUND (EUS) RADIAL;  Surgeon: Lemar Lofty., MD;  Location: [facility] ENDOSCOPY;  Service: Gastroenterology;  Laterality: N/A;   EUS  12/09/2019   Procedure: UPPER ENDOSCOPIC ULTRASOUND (EUS) LINEAR;  Surgeon: Lemar Lofty., MD;  Location: [facility] ENDOSCOPY;  Service: Gastroenterology;;   EUS N/A 03/08/2021   Procedure: UPPER ENDOSCOPIC ULTRASOUND (EUS) RADIAL;  Surgeon: Lemar Lofty., MD;  Location: [facility] ENDOSCOPY;  Service: Gastroenterology;  Laterality: N/A;   FINE NEEDLE ASPIRATION  09/02/2019   Procedure: FINE NEEDLE ASPIRATION (FNA) LINEAR;  Surgeon: Lemar Lofty., MD;  Location: [facility] ENDOSCOPY;  Service: Gastroenterology;;   FINE NEEDLE ASPIRATION  12/09/2019   Procedure: FINE NEEDLE ASPIRATION (FNA) LINEAR;  Surgeon: Lemar Lofty., MD;  Location: [facility] ENDOSCOPY;  Service: Gastroenterology;;   HEMOSTASIS CLIP PLACEMENT  12/09/2019   Procedure: HEMOSTASIS CLIP PLACEMENT;  Surgeon: Lemar Lofty., MD;  Location: [facility] ENDOSCOPY;  Service: Gastroenterology;;   ORIF ANKLE FRACTURE Right 04/27/2015   Procedure: OPEN REDUCTION INTERNAL FIXATION (ORIF) RIGHT BIMALLEOLAR ANKLE FRACTURE WITH SYNDESMOSIS FIXATION;  Surgeon: Tarry Kos, MD;  Location: [facility] OR;  Service: Orthopedics;  Laterality: Right;  SUBMUCOSAL LIFTING INJECTION  12/09/2019   Procedure: SUBMUCOSAL LIFTING INJECTION;  Surgeon: Meridee Score Netty Starring., MD;  Location: [facility] ENDOSCOPY;  Service: Gastroenterology;;   TEE WITHOUT CARDIOVERSION N/A 08/01/2016   Procedure: TRANSESOPHAGEAL ECHOCARDIOGRAM (TEE);  Surgeon: Lewayne Bunting, MD;  Location: [facility] ENDOSCOPY;  Service: Cardiovascular;  Laterality: N/A;   THYROIDECTOMY  11/24/2013   DR Derrell Lolling   THYROIDECTOMY N/A 11/24/2013   Procedure: TOTAL THYROIDECTOMY;  Surgeon: Ernestene Mention, MD;  Location: [facility] OR;  Service:  General;  Laterality: N/A;   TRANSTHORACIC ECHOCARDIOGRAM  01/15/2013   EF 55% TO 65%. PROBABLE MILD HYPOKINESIS OF THE INFERIOR MYOCARDIUM. GRADE 1 DIASTOLIC DYSFUNCTION. TRIAL AR.LA IS MILDLY DILATED.   UPPER ESOPHAGEAL ENDOSCOPIC ULTRASOUND (EUS) N/A 07/08/2023   Procedure: UPPER ESOPHAGEAL ENDOSCOPIC ULTRASOUND (EUS);  Surgeon: Lemar Lofty., MD;  Location: [facility] ENDOSCOPY;  Service: Gastroenterology;  Laterality: N/A;    Social History:  reports that she quit smoking about 33 years ago. Her smoking use included cigarettes. She started smoking about 48 years ago. She has a 15 pack-year smoking history. She has never been exposed to tobacco smoke. She has never used smokeless tobacco. She reports current alcohol use. She reports that she does not use drugs. Family History: family history includes Bone cancer in her sister; Diabetes in her father; Heart disease in her brother and mother; Melanoma in her brother; Stroke in her father; Sudden death in her brother and mother.   HOME MEDICATIONS: Allergies as of 03/02/2024       Reactions   Labetalol    headaches   Codeine Anxiety        Medication List        Accurate as of March 02, 2024  7:24 AM. If you have any questions, ask your nurse or doctor.          acetaminophen 500 MG tablet Commonly known as: TYLENOL Take 1,000 mg by mouth every 6 (six) hours as needed for moderate pain.   allopurinol 100 MG tablet Commonly known as: ZYLOPRIM Take 1 tablet (100 mg total) by mouth daily.   amLODipine 5 MG tablet Commonly known as: NORVASC Take 1.5 tablets (7.5 mg total) by mouth daily.   atovaquone-proguanil 250-100 MG Tabs tablet Commonly known as: MALARONE Take 1 tablet by mouth daily.   carvedilol 12.5 MG tablet Commonly known as: COREG TAKE 1 TABLET BY MOUTH TWICE DAILY WITH A MEAL   Comirnaty syringe Generic drug: COVID-19 mRNA vaccine (Pfizer)   cyanocobalamin 1000 MCG tablet Commonly known as: VITAMIN  B12 Take 1,000 mcg by mouth daily.   dapagliflozin propanediol 10 MG Tabs tablet Commonly known as: FARXIGA Take 10 mg by mouth in the morning.   Eliquis 5 MG Tabs tablet Generic drug: apixaban Take 1 tablet by mouth twice daily   Fluad 0.5 ML injection Generic drug: influenza vaccine adjuvanted   fluconazole 150 MG tablet Commonly known as: DIFLUCAN Take 150 mg by mouth once.   furosemide 20 MG tablet Commonly known as: LASIX Take 20 mg by mouth daily as needed for edema.   isosorbide dinitrate 5 MG tablet Commonly known as: ISORDIL Take 1 tablet by mouth twice daily   OCUVITE ADULT 50+ PO Take 1 tablet by mouth daily.   olmesartan 40 MG tablet Commonly known as: BENICAR Take 40 mg by mouth daily.   Ozempic (0.25 or 0.5 MG/DOSE) 2 MG/3ML Sopn Generic drug: Semaglutide(0.25 or 0.5MG /DOS) Inject 0.5 mg into the skin once a week.   PROBIOTIC PO  Take 1 capsule by mouth 2 (two) times a week.   rosuvastatin 40 MG tablet Commonly known as: CRESTOR Take 1 tablet by mouth once daily   Synthroid 112 MCG tablet Generic drug: levothyroxine Take 1 tablet (112 mcg total) by mouth daily before breakfast.          REVIEW OF SYSTEMS: A comprehensive ROS was conducted with the patient and is negative except as per HPI    OBJECTIVE:  VS: There were no vitals taken for this visit.  Wt Readings from Last 3 Encounters:  01/27/24 201 lb 1.6 oz (91.2 kg)  12/26/23 199 lb (90.3 kg)  11/21/23 198 lb 9.6 oz (90.1 kg)     EXAM: General: Pt appears well and is in NAD  Neck: General: Supple without adenopathy. Thyroid: Thyroid size normal.  No goiter or nodules appreciated.  Lungs: Clear with good BS bilat with no rales, rhonchi, or wheezes  Heart: Auscultation: RRR.  Abdomen: Normoactive bowel sounds, soft, nontender, without masses or organomegaly palpable  Extremities:  BL LE: No pretibial edema   Mental Status: Judgment, insight: Intact Orientation: Oriented to  time, place, and person Mood and affect: No depression, anxiety, or agitation     DATA REVIEWED:  Latest Reference Range & Units 09/02/23 08:50  VITD 30.00 - 100.00 ng/mL 45.79    Latest Reference Range & Units 09/02/23 08:50  Hemoglobin A1C 4.6 - 6.5 % 5.6  TSH 0.35 - 5.50 uIU/mL 5.58 (H)  (H): Data is abnormally high  ASSESSMENT/PLAN/RECOMMENDATIONS:   Postoperative Hypothyroidism    - Pt is  clinically euthyroid  - No local neck symptoms - TSH is elevated, 5 months ago her TSH was normal ?,  The patient did mention that at times she is not sure if she took her levothyroxine in the morning or not -I have advised the patient to obtain a pillbox, she was advised that she may double up on levothyroxine if she forgets to take it the prior day -I would not make any changes to her dose at this time  Medications :  Continue Synthroid 112 mcg , half on Sundays and 1 tab rest of the week     2. Pre-diabetes :  -A1c **** -Patient is on [place] through cardiology -She was on Ozempic through nephrology, but it appears insurance is not covering for it anymore, and has regained the weight -Referral to weight management clinic in [place] has been placed,    F/U 6 months   Signed electronically by: Lyndle Herrlich, MD  [facility] Endocrinology  [facility] [address] [place], [place] [address] Phone: [phone] FAX: [phone]   CC: Avanell Shackleton, NP-C [address] [place] [address] Phone: [phone] Fax: [phone]   Return to Endocrinology clinic as below: Future Appointments  Date Time Provider Department Center  03/02/2024  8:30 AM Wilmary Levit, Konrad Dolores, MD [facility] None  04/01/2024  9:00 AM Lennette Bihari, MD [facility] None  05/21/2024  3:00 PM Pollyann Savoy, MD [facility] None

## 2024-03-03 ENCOUNTER — Telehealth: Payer: Self-pay | Admitting: Internal Medicine

## 2024-03-03 LAB — HEMOGLOBIN A1C
Hgb A1c MFr Bld: 5.6 %{Hb} (ref <5.7)
Mean Plasma Glucose: 114 mg/dL
eAG (mmol/L): 6.3 mmol/L

## 2024-03-03 LAB — TSH: TSH: 0.47 m[IU]/L (ref 0.40–4.50)

## 2024-03-03 LAB — T4, FREE: Free T4: 2 ng/dL — ABNORMAL HIGH (ref 0.8–1.8)

## 2024-03-03 MED ORDER — LEVOTHYROXINE SODIUM 100 MCG PO TABS: 100.0000 ug | ORAL_TABLET | Freq: Every day | ORAL | 3 refills | Status: DC

## 2024-03-03 MED ORDER — SYNTHROID 100 MCG PO TABS
100.0000 ug | ORAL_TABLET | Freq: Every day | ORAL | 3 refills | Status: DC
Start: 2024-03-03 — End: 2024-06-29

## 2024-03-03 NOTE — Telephone Encounter (Signed)
 LMTCB

## 2024-03-03 NOTE — Telephone Encounter (Signed)
 Please let the patient know that her thyroid shows she is on too much Synthroid, please stop Synthroid 112 mcg and start a new prescription of Synthroid 100 mcg daily.   Please make sure that the patient uses a pillbox   A1c remains good at 5.6%

## 2024-03-09 ENCOUNTER — Other Ambulatory Visit (HOSPITAL_COMMUNITY): Payer: Self-pay

## 2024-03-12 ENCOUNTER — Telehealth: Payer: Self-pay

## 2024-03-12 NOTE — Telephone Encounter (Signed)
 Pharmacy Patient Advocate Encounter   Received notification from CoverMyMeds that prior authorization for Ozempic is required/requested.   Insurance verification completed.   The patient is insured through [facility] .   Per test claim: PA required; PA submitted to above mentioned insurance via CoverMyMeds Key/confirmation #/EOC [facility] Status is pending

## 2024-03-18 ENCOUNTER — Encounter: Payer: Self-pay | Admitting: Family Medicine

## 2024-03-18 ENCOUNTER — Ambulatory Visit (INDEPENDENT_AMBULATORY_CARE_PROVIDER_SITE_OTHER): Admitting: Family Medicine

## 2024-03-18 VITALS — BP 126/82 | HR 81 | Temp 97.6°F | Ht 65.0 in | Wt 204.0 lb

## 2024-03-18 DIAGNOSIS — J3489 Other specified disorders of nose and nasal sinuses: Secondary | ICD-10-CM | POA: Diagnosis not present

## 2024-03-18 DIAGNOSIS — R635 Abnormal weight gain: Secondary | ICD-10-CM | POA: Diagnosis not present

## 2024-03-18 DIAGNOSIS — R6883 Chills (without fever): Secondary | ICD-10-CM

## 2024-03-18 DIAGNOSIS — R0981 Nasal congestion: Secondary | ICD-10-CM

## 2024-03-18 DIAGNOSIS — J309 Allergic rhinitis, unspecified: Secondary | ICD-10-CM

## 2024-03-18 LAB — POC COVID19 BINAXNOW: SARS Coronavirus 2 Ag: NEGATIVE

## 2024-03-18 LAB — POCT INFLUENZA A/B
Influenza A, POC: NEGATIVE
Influenza B, POC: NEGATIVE

## 2024-03-18 MED ORDER — LORATADINE 10 MG PO TABS: 10.0000 mg | ORAL_TABLET | Freq: Every day | ORAL | 0 refills | Status: AC

## 2024-03-18 NOTE — Patient Instructions (Addendum)
 Your COVID and flu tests are both negative.  You may try taking a once daily Claritin for the next 1 to 2 weeks in order to see if this helps with your symptoms.  If you are getting worse, you should be seen again.

## 2024-03-18 NOTE — Progress Notes (Signed)
 Subjective:     Patient ID: Claudia Cantrell, female    DOB: 12/20/1945, 78 y.o.   MRN: [id number]  Chief Complaint  Patient presents with   Nasal Congestion    Runny nose, has taken OTC medications w no relief    Shortness of Breath    Since Saturday, Sunday. Not sure if its bc she gained weight   Fatigue    Shortness of Breath Pertinent negatives include no abdominal pain, chest pain, fever, leg swelling, sore throat or vomiting.    History of Present Illness         C/o a 5 day hx of chills, fatigue, rhinorrhea, congestion, and mild shortness of breath.  States shortness of breath has improved over the past few days.  No fever, dizziness, body aches, headache, ear pain, ST, cough, chest pain, palpitations, N/V/D.   States she has been having shortness of breath, she was seen at her endocrinologist and referred to [facility] due to recent weight gain and Ozempic no longer being affordable.   She follows with rheumatology for gout Endocrinology for hypothyroidism and prediabetes  Follows with cardiology for A.Fib , S/P ablation   Follows with neurology for cervical dystonia     Health Maintenance Due  Topic Date Due   DTaP/Tdap/Td (1 - Tdap) Never done   Fecal DNA (Cologuard)  Never done   COVID-19 Vaccine (5 - 2024-25 season) 08/11/2023    Past Medical History:  Diagnosis Date   Arthritis    rheumatoid   Back pain    CAD (coronary artery disease)    a. 1992 s/p MI and PTCA of unknown vessel;  b. 09/2010 Cath: LM nl, LAD 20p, LCX 65m, RCA 69m, RPL 20.   Cancer [facility])    Cervical cancer (HCC) 1977   Chest pain    Chronic kidney disease    Constipation    Family history of anesthesia complication    daughter has difficulty waking    GERD (gastroesophageal reflux disease)    occ   Gout 10/08/2016   History of heart attack    Hyperlipidemia    Hypertensive heart disease    Hypothyroidism    Joint pain    Nonischemic cardiomyopathy (HCC)    a. 07/2016 Echo: EF  40-45%, mild LVH, inferior akinesis, moderately dilated left atrium, trivial AI and MR.   PAF (paroxysmal atrial fibrillation) (HCC)    a. 07/2016 Admitted w/ AF RVR-->CHA2DS2VASc = 5-->Eliquis;  b. 07/2016 successful TEE/DCCV.   Pre-diabetes    Sleep apnea    a. Using CPAP.   SOB (shortness of breath)    Vitamin D deficiency     Past Surgical History:  Procedure Laterality Date   ABDOMINAL HYSTERECTOMY  12/10/1986   ATRIAL FIBRILLATION ABLATION N/A 07/28/2021   Procedure: ATRIAL FIBRILLATION ABLATION;  Surgeon: Regan Lemming, MD;  Location: [facility] INVASIVE CV LAB;  Service: Cardiovascular;  Laterality: N/A;   ATRIAL FIBRILLATION ABLATION N/A 12/14/2022   Procedure: ATRIAL FIBRILLATION ABLATION;  Surgeon: Regan Lemming, MD;  Location: [facility] INVASIVE CV LAB;  Service: Cardiovascular;  Laterality: N/A;   BACK SURGERY  12/10/1992   BIOPSY  09/02/2019   Procedure: BIOPSY;  Surgeon: Meridee Score Netty Starring., MD;  Location: [facility] ENDOSCOPY;  Service: Gastroenterology;;   BIOPSY  12/09/2019   Procedure: BIOPSY;  Surgeon: Lemar Lofty., MD;  Location: [facility] ENDOSCOPY;  Service: Gastroenterology;;   BIOPSY  03/08/2021   Procedure: BIOPSY;  Surgeon: Lemar Lofty., MD;  Location: [facility]  ENDOSCOPY;  Service: Gastroenterology;;   BIOPSY  07/08/2023   Procedure: BIOPSY;  Surgeon: Meridee Score Netty Starring., MD;  Location: [facility] ENDOSCOPY;  Service: Gastroenterology;;   CARDIAC CATHETERIZATION  1991 AND 1992   PTCA BY DR Meryl Crutch   CARDIOVERSION N/A 08/01/2016   Procedure: CARDIOVERSION;  Surgeon: Lewayne Bunting, MD;  Location: [facility] ENDOSCOPY;  Service: Cardiovascular;  Laterality: N/A;   CARDIOVERSION N/A 01/11/2020   Procedure: CARDIOVERSION;  Surgeon: Wendall Stade, MD;  Location: [facility] ENDOSCOPY;  Service: Cardiovascular;  Laterality: N/A;   CARDIOVERSION N/A 07/08/2020   Procedure: CARDIOVERSION;  Surgeon: Little Ishikawa, MD;  Location: [facility] ENDOSCOPY;  Service:  Cardiovascular;  Laterality: N/A;   CARDIOVERSION N/A 05/31/2022   Procedure: CARDIOVERSION;  Surgeon: Chrystie Nose, MD;  Location: [facility] ENDOSCOPY;  Service: Cardiovascular;  Laterality: N/A;   CARDIOVERSION N/A 07/18/2022   Procedure: CARDIOVERSION;  Surgeon: Little Ishikawa, MD;  Location: [facility] ENDOSCOPY;  Service: Cardiovascular;  Laterality: N/A;   CATARACT EXTRACTION  2024   June/July 2024 bilateral eyes   COLONOSCOPY  07/2023   ENDOSCOPIC MUCOSAL RESECTION N/A 12/09/2019   Procedure: ENDOSCOPIC MUCOSAL RESECTION;  Surgeon: Lemar Lofty., MD;  Location: [facility] ENDOSCOPY;  Service: Gastroenterology;  Laterality: N/A;   ESOPHAGOGASTRODUODENOSCOPY N/A 03/08/2021   Procedure: ESOPHAGOGASTRODUODENOSCOPY (EGD);  Surgeon: Lemar Lofty., MD;  Location: [facility] ENDOSCOPY;  Service: Gastroenterology;  Laterality: N/A;   ESOPHAGOGASTRODUODENOSCOPY (EGD) WITH PROPOFOL N/A 09/02/2019   Procedure: ESOPHAGOGASTRODUODENOSCOPY (EGD) WITH PROPOFOL;  Surgeon: Meridee Score Netty Starring., MD;  Location: [facility] ENDOSCOPY;  Service: Gastroenterology;  Laterality: N/A;   ESOPHAGOGASTRODUODENOSCOPY (EGD) WITH PROPOFOL N/A 12/09/2019   Procedure: ESOPHAGOGASTRODUODENOSCOPY (EGD) WITH PROPOFOL;  Surgeon: Meridee Score Netty Starring., MD;  Location: [facility] ENDOSCOPY;  Service: Gastroenterology;  Laterality: N/A;   ESOPHAGOGASTRODUODENOSCOPY (EGD) WITH PROPOFOL N/A 07/08/2023   Procedure: ESOPHAGOGASTRODUODENOSCOPY (EGD) WITH PROPOFOL;  Surgeon: Meridee Score Netty Starring., MD;  Location: [facility] ENDOSCOPY;  Service: Gastroenterology;  Laterality: N/A;   EUS N/A 09/02/2019   Procedure: UPPER ENDOSCOPIC ULTRASOUND (EUS) RADIAL;  Surgeon: Lemar Lofty., MD;  Location: [facility] ENDOSCOPY;  Service: Gastroenterology;  Laterality: N/A;  EUS radial/linear   EUS N/A 12/09/2019   Procedure: UPPER ENDOSCOPIC ULTRASOUND (EUS) RADIAL;  Surgeon: Lemar Lofty., MD;  Location: [facility] ENDOSCOPY;  Service: Gastroenterology;   Laterality: N/A;   EUS  12/09/2019   Procedure: UPPER ENDOSCOPIC ULTRASOUND (EUS) LINEAR;  Surgeon: Lemar Lofty., MD;  Location: [facility] ENDOSCOPY;  Service: Gastroenterology;;   EUS N/A 03/08/2021   Procedure: UPPER ENDOSCOPIC ULTRASOUND (EUS) RADIAL;  Surgeon: Lemar Lofty., MD;  Location: [facility] ENDOSCOPY;  Service: Gastroenterology;  Laterality: N/A;   FINE NEEDLE ASPIRATION  09/02/2019   Procedure: FINE NEEDLE ASPIRATION (FNA) LINEAR;  Surgeon: Lemar Lofty., MD;  Location: [facility] ENDOSCOPY;  Service: Gastroenterology;;   FINE NEEDLE ASPIRATION  12/09/2019   Procedure: FINE NEEDLE ASPIRATION (FNA) LINEAR;  Surgeon: Lemar Lofty., MD;  Location: [facility] ENDOSCOPY;  Service: Gastroenterology;;   HEMOSTASIS CLIP PLACEMENT  12/09/2019   Procedure: HEMOSTASIS CLIP PLACEMENT;  Surgeon: Lemar Lofty., MD;  Location: [facility] ENDOSCOPY;  Service: Gastroenterology;;   ORIF ANKLE FRACTURE Right 04/27/2015   Procedure: OPEN REDUCTION INTERNAL FIXATION (ORIF) RIGHT BIMALLEOLAR ANKLE FRACTURE WITH SYNDESMOSIS FIXATION;  Surgeon: Tarry Kos, MD;  Location: [facility] OR;  Service: Orthopedics;  Laterality: Right;   SUBMUCOSAL LIFTING INJECTION  12/09/2019   Procedure: SUBMUCOSAL LIFTING INJECTION;  Surgeon: Meridee Score Netty Starring., MD;  Location: [facility] ENDOSCOPY;  Service: Gastroenterology;;   TEE WITHOUT CARDIOVERSION  N/A 08/01/2016   Procedure: TRANSESOPHAGEAL ECHOCARDIOGRAM (TEE);  Surgeon: Lewayne Bunting, MD;  Location: [facility] ENDOSCOPY;  Service: Cardiovascular;  Laterality: N/A;   THYROIDECTOMY  11/24/2013   DR Derrell Lolling   THYROIDECTOMY N/A 11/24/2013   Procedure: TOTAL THYROIDECTOMY;  Surgeon: Ernestene Mention, MD;  Location: [facility] OR;  Service: General;  Laterality: N/A;   TRANSTHORACIC ECHOCARDIOGRAM  01/15/2013   EF 55% TO 65%. PROBABLE MILD HYPOKINESIS OF THE INFERIOR MYOCARDIUM. GRADE 1 DIASTOLIC DYSFUNCTION. TRIAL AR.LA IS MILDLY DILATED.   UPPER ESOPHAGEAL ENDOSCOPIC ULTRASOUND  (EUS) N/A 07/08/2023   Procedure: UPPER ESOPHAGEAL ENDOSCOPIC ULTRASOUND (EUS);  Surgeon: Lemar Lofty., MD;  Location: [facility] ENDOSCOPY;  Service: Gastroenterology;  Laterality: N/A;    Family History  Problem Relation Age of Onset   Heart disease Mother    Sudden death Mother    Diabetes Father    Stroke Father    Bone cancer Sister    Sudden death Brother    Melanoma Brother    Heart disease Brother    Colon cancer Neg Hx    Esophageal cancer Neg Hx    Inflammatory bowel disease Neg Hx    Liver disease Neg Hx    Pancreatic cancer Neg Hx    Rectal cancer Neg Hx    Stomach cancer Neg Hx     Social History   Socioeconomic History   Marital status: Widowed    Spouse name: Not on file   Number of children: 3   Years of education: Not on file   Highest education level: Not on file  Occupational History   Occupation: retired  Tobacco Use   Smoking status: Former    Current packs/day: 0.00    Average packs/day: 1 pack/day for 15.0 years (15.0 ttl pk-yrs)    Types: Cigarettes    Start date: 12/11/1975    Quit date: 12/10/1990    Years since quitting: 33.2    Passive exposure: Never   Smokeless tobacco: Never   Tobacco comments:    Former smoker 05/21/22  Vaping Use   Vaping status: Never Used  Substance and Sexual Activity   Alcohol use: Yes    Comment: very rare   Drug use: No   Sexual activity: Not Currently  Other Topics Concern   Not on file  Social History Narrative   Right handed    Living  alone.   Social Drivers of Corporate investment banker Strain: Low Risk  (06/21/2023)   Overall Financial Resource Strain (CARDIA)    Difficulty of Paying Living Expenses: Not very hard  Food Insecurity: No Food Insecurity (06/21/2023)   Hunger Vital Sign    Worried About Running Out of Food in the Last Year: Never true    Ran Out of Food in the Last Year: Never true  Transportation Needs: No Transportation Needs (06/21/2023)   PRAPARE - Scientist, research (physical sciences) (Medical): No    Lack of Transportation (Non-Medical): No  Physical Activity: Insufficiently Active (06/21/2023)   Exercise Vital Sign    Days of Exercise per Week: 5 days    Minutes of Exercise per Session: 20 min  Stress: No Stress Concern Present (06/21/2023)   [employer] of Occupational Health - Occupational Stress Questionnaire    Feeling of Stress : Not at all  Social Connections: Socially Isolated (06/21/2023)   Social Connection and Isolation Panel [NHANES]    Frequency of Communication with Friends and Family: Twice a week  Frequency of Social Gatherings with Friends and Family: Twice a week    Attends Religious Services: Never    Database administrator or Organizations: No    Attends Banker Meetings: Never    Marital Status: Widowed  Intimate Partner Violence: Not At Risk (06/21/2023)   Humiliation, Afraid, Rape, and Kick questionnaire    Fear of Current or Ex-Partner: No    Emotionally Abused: No    Physically Abused: No    Sexually Abused: No    Outpatient Medications Prior to Visit  Medication Sig Dispense Refill   acetaminophen (TYLENOL) 500 MG tablet Take 1,000 mg by mouth every 6 (six) hours as needed for moderate pain.     allopurinol (ZYLOPRIM) 100 MG tablet Take 1 tablet (100 mg total) by mouth daily. 30 tablet 2   amLODipine (NORVASC) 5 MG tablet Take 1.5 tablets (7.5 mg total) by mouth daily. 45 tablet 1   apixaban (ELIQUIS) 5 MG TABS tablet Take 1 tablet by mouth twice daily 180 tablet 1   carvedilol (COREG) 12.5 MG tablet TAKE 1 TABLET BY MOUTH TWICE DAILY WITH A MEAL 180 tablet 0   COMIRNATY syringe      cyanocobalamin (VITAMIN B12) 1000 MCG tablet Take 1,000 mcg by mouth daily.     dapagliflozin propanediol (FARXIGA) 10 MG TABS tablet Take 10 mg by mouth in the morning.     FLUAD 0.5 ML injection      fluconazole (DIFLUCAN) 150 MG tablet Take 150 mg by mouth once.     furosemide (LASIX) 20 MG tablet Take 20 mg by mouth  daily as needed for edema.     isosorbide dinitrate (ISORDIL) 5 MG tablet Take 1 tablet by mouth twice daily 180 tablet 3   Multiple Vitamins-Minerals (OCUVITE ADULT 50+ PO) Take 1 tablet by mouth daily.     olmesartan (BENICAR) 40 MG tablet Take 40 mg by mouth daily.     Probiotic Product (PROBIOTIC PO) Take 1 capsule by mouth 2 (two) times a week.     rosuvastatin (CRESTOR) 40 MG tablet Take 1 tablet by mouth once daily 90 tablet 3   Semaglutide,0.25 or 0.5MG /DOS, (OZEMPIC, 0.25 OR 0.5 MG/DOSE,) 2 MG/3ML SOPN Inject 0.5 mg into the skin once a week. 9 mL 3   SYNTHROID 100 MCG tablet Take 1 tablet (100 mcg total) by mouth daily before breakfast. 90 tablet 3   atovaquone-proguanil (MALARONE) 250-100 MG TABS tablet Take 1 tablet by mouth daily. 17 tablet 0   No facility-administered medications prior to visit.    Allergies  Allergen Reactions   Labetalol     headaches   Codeine Anxiety    Review of Systems  Constitutional:  Positive for chills and malaise/fatigue. Negative for fever and weight loss.  HENT:  Positive for congestion. Negative for sore throat.   Respiratory:  Positive for shortness of breath. Negative for cough.   Cardiovascular:  Negative for chest pain, palpitations and leg swelling.  Gastrointestinal:  Negative for abdominal pain, constipation, diarrhea, nausea and vomiting.  Genitourinary:  Negative for dysuria, frequency and urgency.  Neurological:  Negative for dizziness and focal weakness.  Psychiatric/Behavioral:  Negative for depression. The patient is not nervous/anxious.        Objective:    Physical Exam Constitutional:      General: She is not in acute distress.    Appearance: She is not ill-appearing.  HENT:     Right Ear: Tympanic membrane and ear canal normal.  Left Ear: Tympanic membrane and ear canal normal.     Nose: Congestion and rhinorrhea present.     Right Sinus: No maxillary sinus tenderness or frontal sinus tenderness.     Left  Sinus: No maxillary sinus tenderness or frontal sinus tenderness.     Mouth/Throat:     Mouth: Mucous membranes are moist.  Eyes:     Extraocular Movements: Extraocular movements intact.     Conjunctiva/sclera: Conjunctivae normal.  Cardiovascular:     Rate and Rhythm: Normal rate and regular rhythm.  Pulmonary:     Effort: Pulmonary effort is normal.     Breath sounds: Normal breath sounds.  Musculoskeletal:     Cervical back: Normal range of motion and neck supple. No tenderness.     Right lower leg: No edema.     Left lower leg: No edema.  Lymphadenopathy:     Cervical: No cervical adenopathy.  Skin:    General: Skin is warm and dry.  Neurological:     General: No focal deficit present.     Mental Status: She is alert and oriented to person, place, and time.     Motor: No weakness.     Coordination: Coordination normal.     Gait: Gait normal.  Psychiatric:        Mood and Affect: Mood normal.        Behavior: Behavior normal.        Thought Content: Thought content normal.      BP 126/82 (BP Location: Left Arm, Patient Position: Sitting)   Pulse 81   Temp 97.6 F (36.4 C) (Temporal)   Ht 5\' 5"  (1.651 m)   Wt 204 lb (92.5 kg)   SpO2 98%   BMI 33.95 kg/m  Wt Readings from Last 3 Encounters:  03/18/24 204 lb (92.5 kg)  03/02/24 198 lb 9.6 oz (90.1 kg)  01/27/24 201 lb 1.6 oz (91.2 kg)       Assessment & Plan:   Problem List Items Addressed This Visit   None Visit Diagnoses       Nasal congestion with rhinorrhea    -  Primary   Relevant Orders   POCT Influenza A/B (Completed)   POC COVID-19 BinaxNow (Completed)     Chills       Relevant Orders   POCT Influenza A/B (Completed)   POC COVID-19 BinaxNow (Completed)     Weight gain         Allergic rhinitis, unspecified seasonality, unspecified trigger       Relevant Medications   loratadine (CLARITIN) 10 MG tablet      COVID and flu tests are negative. Suspect allergies are contributing to her  symptoms.  Try Claritin.  Continue current medications. Shortness of breath is improving.  She is currently on Eliquis.  Euvolemic. Ozempic no longer affordable and she is regaining her weight.  Endocrinology referred here to [facility].  Encouraged her to follow-up if she is not improving or if she has any new or worsening symptoms.  I have discontinued Cimberly Stoffel. Brazie's atovaquone-proguanil. I am also having her start on loratadine. Additionally, I am having her maintain her dapagliflozin propanediol, Probiotic Product (PROBIOTIC PO), furosemide, acetaminophen, allopurinol, olmesartan, cyanocobalamin, Multiple Vitamins-Minerals (OCUVITE ADULT 50+ PO), rosuvastatin, isosorbide dinitrate, Eliquis, carvedilol, amLODipine, Comirnaty, fluconazole, Fluad, Ozempic (0.25 or 0.5 MG/DOSE), and Synthroid.  Meds ordered this encounter  Medications   loratadine (CLARITIN) 10 MG tablet    Sig: Take 1 tablet (10 mg total) by mouth  daily.    Dispense:  30 tablet    Refill:  0    Supervising Provider:   Hillard Danker A [[id number]]

## 2024-04-01 ENCOUNTER — Encounter: Payer: Self-pay | Admitting: Cardiovascular Disease

## 2024-04-01 ENCOUNTER — Ambulatory Visit: Payer: Medicare PPO | Attending: Cardiovascular Disease | Admitting: Cardiovascular Disease

## 2024-04-01 DIAGNOSIS — G4733 Obstructive sleep apnea (adult) (pediatric): Secondary | ICD-10-CM | POA: Diagnosis not present

## 2024-04-01 DIAGNOSIS — E785 Hyperlipidemia, unspecified: Secondary | ICD-10-CM

## 2024-04-01 DIAGNOSIS — D6869 Other thrombophilia: Secondary | ICD-10-CM | POA: Diagnosis not present

## 2024-04-01 DIAGNOSIS — I10 Essential (primary) hypertension: Secondary | ICD-10-CM

## 2024-04-01 DIAGNOSIS — I4819 Other persistent atrial fibrillation: Secondary | ICD-10-CM | POA: Diagnosis not present

## 2024-04-01 DIAGNOSIS — I428 Other cardiomyopathies: Secondary | ICD-10-CM

## 2024-04-01 NOTE — Patient Instructions (Signed)
 Medication Instructions:  NO CHANGES *If you need a refill on your cardiac medications before your next appointment, please call your pharmacy*  Lab Work: CMP,CBC, FASTING LIPID- TOMORROW MORNING If you have labs (blood work) drawn today and your tests are completely normal, you will receive your results only by: MyChart Message (if you have MyChart) OR A paper copy in the mail If you have any lab test that is abnormal or we need to change your treatment, we will call you to review the results.  Testing/Procedures: NO TESTING  Follow-Up: At [facility], you and your health needs are our priority.  As part of our continuing mission to provide you with exceptional heart care, our providers are all part of one team.  This team includes your primary Cardiologist (physician) and Advanced Practice Providers or APPs (Physician Assistants and Nurse Practitioners) who all work together to provide you with the care you need, when you need it.  Your next appointment:   FOLLOW UP WITH EP NEXT WEEK OR FIRST AVAILABLE  Provider:   Will Cortland Ding, MD and as needed with Gaylyn Keas, MD  Other Instructions   1st Floor: - Lobby - Registration  - Pharmacy  - Lab - Cafe  2nd Floor: - PV Lab - Diagnostic Testing (echo, CT, nuclear med)  3rd Floor: - Vacant  4th Floor: - TCTS (cardiothoracic surgery) - AFib Clinic - Structural Heart Clinic - Vascular Surgery  - Vascular Ultrasound  5th Floor: - HeartCare Cardiology (general and EP) - Clinical Pharmacy for coumadin, hypertension, lipid, weight-loss medications, and med management appointments    Valet parking services will be available as well.

## 2024-04-01 NOTE — Progress Notes (Unsigned)
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 bradycardia 57 bpm.  Low voltage.  Poor R wave progression.  April 2019 ECG (independently read by me): Sinus bradycardia 53 bpm.  Nonspecific T changes.  Normal intervals.  December 16, 2017 ECG (independently read by me): Sinus bradycardia 57 bpm.  Q wave in lead 3.  Poor R wave progression V1 through V3.  October  2017 ECG (independently read by me): Sinus bradycardia 52 bpm.  Poor anterior R-wave progression.  Normal intervals.  October 2014 ECG: Sinus rhythm at 69 beats per minute. Normal intervals. Nonspecific T changes.  LABS:     Latest Ref Rng & Units 04/02/2024   10:58 AM 02/10/2024    8:26 AM 09/27/2023    2:45 PM  BMP  Glucose 70 - 99 mg/dL 83   83   BUN 8 - 27 mg/dL 16   19   Creatinine 6.04 - 1.00 mg/dL 5.40  9.81  1.91   BUN/Creat Ratio 12 - 28 17     Sodium 134 - 144 mmol/L 146   142   Potassium 3.5 - 5.2 mmol/L 4.6   3.9   Chloride 96 - 106 mmol/L 108   103   CO2 20 - 29 mmol/L 20   30   Calcium  8.7 - 10.3 mg/dL 9.9   47.8       Latest Ref Rng & Units 04/02/2024   10:58  AM 09/27/2023    2:45 PM 09/06/2023   11:46 AM  Hepatic Function  Total Protein 6.0 - 8.5 g/dL 7.3  8.3  7.5   Albumin 3.8 - 4.8 g/dL 4.5  4.8  4.3   AST 0 - 40 IU/L 17  20  16    ALT 0 - 32 IU/L 16  15  11    Alk Phosphatase 44 - 121 IU/L 114  92  77   Total Bilirubin 0.0 - 1.2 mg/dL 0.7  0.8  0.7   Bilirubin, Direct 0.0 - 0.3 mg/dL   0.2       Latest Ref Rng & Units 04/02/2024   10:58 AM 09/27/2023    2:45 PM 09/06/2023   11:46 AM  CBC  WBC 3.4 - 10.8 x10E3/uL 3.5  3.4  4.9   Hemoglobin 11.1 - 15.9 g/dL 29.5  62.1  30.8   Hematocrit 34.0 - 46.6 % 47.9  49.7  49.1   Platelets 150 - 450 x10E3/uL 221  203.0  174.0    Lab Results  Component Value Date   MCV 95 04/02/2024   MCV 99.7 09/27/2023   MCV 99.5 09/06/2023   Lab Results  Component Value Date   TSH 0.47 03/02/2024    Lipid Panel     Component Value Date/Time   CHOL 157 04/02/2024 1058   TRIG 91  04/02/2024 1058   HDL 52 04/02/2024 1058   CHOLHDL 3.0 04/02/2024 1058   CHOLHDL 3.4 09/22/2019 0226   VLDL 19 09/22/2019 0226   LDLCALC 88 04/02/2024 1058   IMPRESSION:  1. OSA on CPAP   2. Essential hypertension   3. Persistent atrial fibrillation (HCC)   4. Secondary hypercoagulable state (HCC)   5. Hyperlipidemia with target low density lipoprotein (LDL) cholesterol less than 50 mg/dL     ASSESSMENT AND PLAN: Ms. Anne Boltz is 78 year-old [place] American female who suffered a remote myocardial infarction and underwent PTCA by Dr. Enoch Harter approximately 30 years ago.  Cardiac catheterization in 2011 demonstrated a small focal wall motion abnormality in the inferior and posterior lateral wall concordant with his myocardial infarction. She has mild nonobstructive CAD. On her last nuclear perfusion study ejection fraction was 55% and she had findings consistent with a small area of scar in the basal and mid inferolateral wall concordant with her left circumflex infarct.  She developed atrial fibrillation in August 2017  and underwent successful TEE cardioversion.  When I saw her in March 2020 her ECG showed that she was back in atrial fibrillation and I further titrated her beta-blocker to Toprol -XL 75 mg daily.  Since that evaluation, she had undergone 2 cardioversions initially in February 2021 and most recently on July 08, 2020 with restoration of sinus rhythm.  After not having seen her in over 2 years, when I saw her in May 2022 she was in atrial fibrillation.  She ultimately was referred to Dr. Lawana Pray and underwent successful A-fib ablation on July 28, 2021.  She had been unaware of any recurrent atrial fibrillation.  She last saw Dr. Lawana Pray on September 18, 2023 and was maintaining sinus bradycardia at 53 bpm.  She has continued to use CPAP with excellent compliance.  Apparently she did not know that she was allowed to take her CPAP on an airplane.  She recently went to [place] with her daughter for several weeks and then to [place].  During that 2-1/2 to 3-week.  She did not use CPAP therapy.  She returned home approximately 3 weeks ago and has noticed more heart rate irregularity since that time.  I suspect with her not using CPAP during that significant timeframe, she developed nocturnal hypoxemia and this played a major role in her recurrent atrial fibrillation.  Presently, her ventricular rate is controlled in the upper 70s.  I will send her to A-fib clinic and Dr. Lawana Pray and I will defer to them to restart amiodarone  or consider Tikosyn therapy.  She has been using CPAP for the last several weeks with excellent compliance and benefit.  Her blood pressure today is stable on current therapy with amlodipine  7.5 mg, and carvedilol  12.5 mg twice a day in addition to isosorbide  dinitrate 5 mg twice a day and olmesartan  40 mg daily.  She has a prescription for furosemide  which she takes only as needed.  She is anticoagulated on Eliquis .  She is diabetic on Farxiga.  She continues to be on rosuvastatin  40 mg for hyperlipidemia.   LDL cholesterol in April 2024 was 77. I am checking a c-Met, CBC and fasting lipid panel.  I also discussed with her my upcoming retirement in several months.  I will transition her to the cardiology care of Dr. Carson Clara and she will need to see Dr. Micael Adas for follow-up of her sleep apnea.  Millicent Ally, MD, [facility], ABSM Diplomate, American Board of Sleep Medicine  04/03/2024 4:01 PM

## 2024-04-02 LAB — CBC

## 2024-04-03 LAB — CBC
Hematocrit: 47.9 % — ABNORMAL HIGH (ref 34.0–46.6)
Hemoglobin: 16 g/dL — ABNORMAL HIGH (ref 11.1–15.9)
MCH: 31.8 pg (ref 26.6–33.0)
MCHC: 33.4 g/dL (ref 31.5–35.7)
MCV: 95 fL (ref 79–97)
Platelets: 221 10*3/uL (ref 150–450)
RBC: 5.03 x10E6/uL (ref 3.77–5.28)
RDW: 13.3 % (ref 11.7–15.4)
WBC: 3.5 10*3/uL (ref 3.4–10.8)

## 2024-04-03 LAB — LIPID PANEL
Chol/HDL Ratio: 3 ratio (ref 0.0–4.4)
Cholesterol, Total: 157 mg/dL (ref 100–199)
HDL: 52 mg/dL (ref >39)
LDL Chol Calc (NIH): 88 mg/dL (ref 0–99)
Triglycerides: 91 mg/dL (ref 0–149)
VLDL Cholesterol Cal: 17 mg/dL (ref 5–40)

## 2024-04-03 LAB — COMPREHENSIVE METABOLIC PANEL WITH GFR
ALT: 16 IU/L (ref 0–32)
AST: 17 IU/L (ref 0–40)
Albumin: 4.5 g/dL (ref 3.8–4.8)
Alkaline Phosphatase: 114 IU/L (ref 44–121)
Bilirubin Total: 0.7 mg/dL (ref 0.0–1.2)
CO2: 20 mmol/L (ref 20–29)
Calcium: 9.9 mg/dL (ref 8.7–10.3)
Chloride: 108 mmol/L — ABNORMAL HIGH (ref 96–106)
Creatinine, Ser: 17 mg/dL (ref 12–28)
Globulin, Total: 17 g/dL (ref 12–4.5)
Globulin, Total: 2.8 g/dL (ref 1.5–4.5)
Glucose: 83 mg/dL (ref 70–99)
Potassium: 4.6 mmol/L (ref 3.5–5.2)
Sodium: 146 mmol/L — ABNORMAL HIGH (ref 134–32)
Total Protein: 7.3 g/dL (ref 6.0–8.5)
eGFR: 62 mL/min/{1.73_m2} (ref 6.0–8.5)
eGFR: 62 mg/dL (ref 8–27)

## 2024-04-07 ENCOUNTER — Encounter: Payer: Self-pay | Admitting: Cardiology

## 2024-04-07 ENCOUNTER — Ambulatory Visit: Attending: Cardiology | Admitting: Cardiology

## 2024-04-07 VITALS — BP 118/60 | HR 58 | Ht 65.0 in | Wt 200.0 lb

## 2024-04-07 DIAGNOSIS — D6869 Other thrombophilia: Secondary | ICD-10-CM

## 2024-04-07 DIAGNOSIS — I2510 Atherosclerotic heart disease of native coronary artery without angina pectoris: Secondary | ICD-10-CM | POA: Diagnosis not present

## 2024-04-07 DIAGNOSIS — G4733 Obstructive sleep apnea (adult) (pediatric): Secondary | ICD-10-CM | POA: Diagnosis not present

## 2024-04-07 DIAGNOSIS — I4819 Other persistent atrial fibrillation: Secondary | ICD-10-CM

## 2024-04-07 MED ORDER — OLMESARTAN MEDOXOMIL 40 MG PO TABS: 40.0000 mg | ORAL_TABLET | Freq: Every day | ORAL | 3 refills | Status: AC

## 2024-04-07 NOTE — Progress Notes (Signed)
  Electrophysiology Office Note:   Date:  04/07/2024  ID:  Claudia Cantrell, DOB [birth year], MRN [id number]  Primary Cardiologist: None Primary Heart Failure: None Electrophysiologist: Ulyses Panico Cortland Ding, MD      History of Present Illness:   Claudia Cantrell is a 78 y.o. female with h/o coronary artery disease, CKD, hypertension, hyperlipidemia, nonischemic cardiomyopathy, atrial fibrillation seen today for routine electrophysiology followup.   Since last being seen in our clinic the patient reports doing she has unfortunately had more episodes of atrial fibrillation.  She feels fatigued, weak, short of breath.  She is in atrial fibrillation today.   she denies chest pain, palpitations, dyspnea, PND, orthopnea, nausea, vomiting, dizziness, syncope, edema, weight gain, or early satiety.   Review of systems complete and found to be negative unless listed in HPI.   EP Information / Studies Reviewed:    EKG is not ordered today. EKG from 04/01/24 reviewed which showed atrial fibrillation        Risk Assessment/Calculations:    CHA2DS2-VASc Score = 6   This indicates a 9.7% annual risk of stroke. The patient's score is based upon: CHF History: 1 HTN History: 1 Diabetes History: 1 Stroke History: 0 Vascular Disease History: 1 Age Score: 1 Gender Score: 1             Physical Exam:   VS:  BP 118/60 (BP Location: Left Arm, Patient Position: Sitting, Cuff Size: Large)   Pulse (!) 58   Ht 5\' 5"  (1.651 m)   Wt 200 lb (90.7 kg)   SpO2 97%   BMI 33.28 kg/m    Wt Readings from Last 3 Encounters:  04/07/24 200 lb (90.7 kg)  04/01/24 203 lb 3.2 oz (92.2 kg)  03/18/24 204 lb (92.5 kg)     GEN: Well nourished, well developed in no acute distress NECK: No JVD; No carotid bruits CARDIAC: Irregularly irregular rate and rhythm, no murmurs, rubs, gallops RESPIRATORY:  Clear to auscultation without rales, wheezing or rhonchi  ABDOMEN: Soft, non-tender, non-distended EXTREMITIES:  No  edema; No deformity   ASSESSMENT AND PLAN:    1.  Persistent atrial fibrillation: Post ablation 07/28/2021 with repeat ablation 12/14/2021.  Amiodarone  has been stopped.  She is unfortunately in atrial fibrillation today.  She feels quite poorly with fatigue and shortness of breath.  She would benefit from rhythm control.  We discussed ablation versus medication management.  For now, we Paris Hohn plan for admission for dofetilide load.  2.  Secondary hypercoagulable state: On Eliquis  for atrial fibrillation  3.  Coronary artery disease: No current chest pain.  Plan per primary cardiology  4.  Obstructive sleep apnea: CPAP compliance encouraged  Follow up with Afib Clinic  re: dofetilide admission   Signed, Delanee Xin Cortland Ding, MD

## 2024-04-07 NOTE — Patient Instructions (Addendum)
 Medication Instructions:  Your physician recommends that you continue on your current medications as directed. Please refer to the Current Medication list given to you today.  *If you need a refill on your cardiac medications before your next appointment, please call your pharmacy*   Lab Work: None ordered If you have labs (blood work) drawn today and your tests are completely normal, you will receive your results only by: MyChart Message (if you have MyChart) OR A paper copy in the mail If you have any lab test that is abnormal or we need to change your treatment, we will call you to review the results.   Testing/Procedures: None ordered   Follow-Up: At [facility], you and your health needs are our priority.  As part of our continuing mission to provide you with exceptional heart care, we have created designated Provider Care Teams.  These Care Teams include your primary Cardiologist (physician) and Advanced Practice Providers (APPs -  Physician Assistants and Nurse Practitioners) who all work together to provide you with the care you need, when you need it.  Your next appointment:   To be  determined  The format for your next appointment:   In Person  Provider:   You will follow up in the Atrial Fibrillation Clinic located at [facility]. Your provider will be: Clint R. Fenton, PA-C or Minnie Amber, PA-C    Thank you for choosing [employer]!!   Reece Cane, RN [phone]  Other Instructions  Please call the office and let us  know your decision.   Dofetilide (Tikosyn) Hospital Admission  Preparing for admission:  Check with drug insurance company for cost of drug to ensure affordability --- Dofetilide 500 mcg twice a day.  GoodRx is an option if insurance copay is unaffordable. Patient assistance is also available.  A pharmacist will review all your medications for potential interactions with Tikosyn. If any medication changes are needed prior  to admission we will be in touch with you.  If any new medications are started AFTER your admission date is set with Loyce Ruffini RN [phone]). Please notify our office immediately so your medication list can be updated and reviewed by our pharmacist again.  No Benadryl  is allowed 3 days prior to admission.  Please ensure no missed doses of your anticoagulation (blood thinner) for 3 weeks prior to admission. If a dose is missed please notify our office immediately.  If you are on coumadin/warfarin we will require 4 weekly therapeutic INR checks prior to admission.   On day of admission:  Afib Clinic office visit will be scheduled on the morning of admission for preliminary labs and EKG.  Please take your morning medications and have a normal breakfast prior to arrival to the appointment.     Tikosyn initiation requires a 3 night/4 day hospital stay with constant telemetry monitoring. You will have an EKG after each dose of Tikosyn as well as daily lab draws.  You may bring personal belongings/clothing with you to the hospital. Please leave your suitcase in the car until you arrive in admissions.  If the drug does not convert you to normal rhythm a cardioversion will be performed after the 4th dose of Tikosyn.   Time of admission is dependent on bed availability in the hospital. In some instances, you will be sent home until bed is available. Rarely admission can be delayed to the following day if hospital census prevents available beds.  Questions please call our office at [phone]  Dofetilide Capsules What is this medication? DOFETILIDE (doe FET il ide) treats a fast or irregular heartbeat (arrhythmia). It works by slowing down overactive electric signals in the heart, which stabilizes your heart rhythm. It belongs to a group of medications called antiarrhythmics. This medicine may be used for other purposes; ask your health care provider or pharmacist if you have questions. COMMON  BRAND NAME(S): Tikosyn What should I tell my care team before I take this medication? They need to know if you have any of these conditions: Heart disease History of irregular heartbeat History of low levels of potassium or magnesium  in the blood Kidney disease Liver disease An unusual or allergic reaction to dofetilide, other medications, foods, dyes, or preservatives Pregnant or trying to get pregnant Breast-feeding How should I use this medication? Take this medication by mouth with a glass of water. Follow the directions on the prescription label. Do not take with grapefruit juice. You can take it with or without food. If it upsets your stomach, take it with food. Take your medication at regular intervals. Do not take it more often than directed. Do not stop taking except on your care team's advice. A special MedGuide will be given to you by the pharmacist with each prescription and refill. Be sure to read this information carefully each time. Talk to your care team about the use of this medication in children. Special care may be needed. Overdosage: If you think you have taken too much of this medicine contact a poison control center or emergency room at once. NOTE: This medicine is only for you. Do not share this medicine with others. What if I miss a dose? If you miss a dose, skip it. Take your next dose at the normal time. Do not take extra or 2 doses at the same time to make up for the missed dose. What may interact with this medication? Do not take this medication with any of the following: Certain antivirals for HIV or hepatitis, such as bictegravir, delavirdine, dolutegravir, saquinavir Certain diuretics, such as chlorothiazide, chlorthalidone , hydrochlorothiazide , metolazone Certain medications for irregular heartbeat, such as dronedarone, lidocaine , mexiletine, procainamide, quinidine Certain medications for fungal infections, such as fexinidazole, fluconazole , isavuconazonium,  itraconazole, ketoconazole, posaconazole Cimetidine Cisapride Fedratinib Fingolimod Megestrol Other medications that cause heart rhythm changes Pimozide Prochlorperazine  Quinine Tafenoquine Thioridazine Trilaciclib Trimethoprim Verapamil Ziprasidone This medication may also interact with the following: Amiloride Cannabinoids Certain antibiotics, such as erythromycin, clarithromycin, ciprofloxacin, levofloxacin Certain medications for depression, anxiety, or other mental health conditions Diltiazem  Grapefruit juice Metformin Triamterene Zafirlukast Other medications may affect the way this medication works. Talk with your care team about all the medications you take. They may suggest changes to your treatment plan to lower the risk of side effects and to make sure your medications work as intended. This list may not describe all possible interactions. Give your health care provider a list of all the medicines, herbs, non-prescription drugs, or dietary supplements you use. Also tell them if you smoke, drink alcohol, or use illegal drugs. Some items may interact with your medicine. What should I watch for while using this medication? Your condition will be monitored carefully while you are receiving this medication. What side effects may I notice from receiving this medication? Side effects that you should report to your care team as soon as possible: Allergic reactions--skin rash, itching, hives, swelling of the face, lips, tongue, or throat Chest pain Heart rhythm changes--fast or irregular heartbeat, dizziness, feeling faint or lightheaded, chest pain, trouble  breathing Side effects that usually do not require medical attention (report to your care team if they continue or are bothersome): Dizziness Headache Nausea Stomach pain Trouble sleeping This list may not describe all possible side effects. Call your doctor for medical advice about side effects. You may report side  effects to FDA at 1-800-FDA-1088. Where should I keep my medication? Keep out of the reach of children. Store at room temperature between 15 and 30 degrees C (59 and 86 degrees F). Throw away any unused medication after the expiration date. NOTE: This sheet is a summary. It may not cover all possible information. If you have questions about this medicine, talk to your doctor, pharmacist, or health care provider.  2024 Elsevier/Gold Standard (2023-07-10 00:00:00)

## 2024-04-08 ENCOUNTER — Telehealth: Payer: Self-pay | Admitting: Cardiology

## 2024-04-08 NOTE — Telephone Encounter (Signed)
 Pt said that she spoke with you regarding calling her insurance for approval for dofetilide. She said that it was approved. Requesting cb

## 2024-04-09 NOTE — Telephone Encounter (Signed)
 Pt aware that I will forward this to the afib clinic who will contact her to arrange Tikosyn adx.  She is agreeable to plan.

## 2024-04-10 ENCOUNTER — Other Ambulatory Visit: Payer: Self-pay

## 2024-04-10 MED ORDER — EZETIMIBE 10 MG PO TABS: 10.0000 mg | ORAL_TABLET | Freq: Every day | ORAL | 3 refills | Status: AC

## 2024-04-13 ENCOUNTER — Telehealth: Payer: Self-pay | Admitting: Pharmacist

## 2024-04-13 NOTE — Telephone Encounter (Signed)
 Medication list reviewed in anticipation of upcoming Tikosyn initiation. Patient is not taking any contraindicated or QTc prolonging medications.   Please monitor K and Mag closely due to the use of furosemide . K should be at least 4 at admission and Mag at least 2.  Of note, patient does have fluconazole  on her med list. It says take once. I do not see it on dispense report, but would recommend confirming patient does not take on a regular basis as this is QTc prolonging.  Patient is anticoagulated on Eliquis  on the appropriate dose. Please ensure that patient has not missed any anticoagulation doses in the 3 weeks prior to Tikosyn initiation.   Patient will need to be counseled to avoid use of Benadryl  while on Tikosyn and in the 2-3 days prior to Tikosyn initiation.  Office note from 09/18/23 notes that amiodarone  has been stopped. Note from 03/13/23 says to stop amiodarone . No recent dx history. No need for amiodarone  level.

## 2024-04-17 NOTE — Telephone Encounter (Signed)
 Pharmacy Patient Advocate Encounter  Received notification from [employer] that Prior Authorization for Ozempic  has been DENIED.  See denial reason below. No denial letter attached in CMM. Will attach denial letter to Media tab once received.

## 2024-04-24 ENCOUNTER — Telehealth (HOSPITAL_COMMUNITY): Payer: Self-pay

## 2024-04-24 NOTE — Telephone Encounter (Signed)
 Initiated prior authorization to [facility] for Tikosyn admission.  Date of service: 05/18/2024 Faxed clinical information to [facility] for review.  Pending Authorization # [id number] Phone # [phone] Fax # [phone]

## 2024-04-26 ENCOUNTER — Emergency Department (HOSPITAL_COMMUNITY)
Admission: EM | Admit: 2024-04-26 | Discharge: 2024-04-27 | Disposition: A | Discharge status: Home / Self Care | Attending: Emergency Medicine | Admitting: Emergency Medicine

## 2024-04-26 ENCOUNTER — Other Ambulatory Visit: Payer: Self-pay

## 2024-04-26 ENCOUNTER — Encounter (HOSPITAL_COMMUNITY): Payer: Self-pay | Admitting: Emergency Medicine

## 2024-04-26 ENCOUNTER — Emergency Department (HOSPITAL_COMMUNITY)

## 2024-04-26 DIAGNOSIS — I4891 Unspecified atrial fibrillation: Secondary | ICD-10-CM | POA: Diagnosis not present

## 2024-04-26 DIAGNOSIS — R600 Localized edema: Secondary | ICD-10-CM | POA: Insufficient documentation

## 2024-04-26 DIAGNOSIS — Z7901 Long term (current) use of anticoagulants: Secondary | ICD-10-CM | POA: Insufficient documentation

## 2024-04-26 DIAGNOSIS — R079 Chest pain, unspecified: Secondary | ICD-10-CM | POA: Diagnosis present

## 2024-04-26 LAB — CBC
HCT: 47.6 % — ABNORMAL HIGH (ref 36.0–46.0)
Hemoglobin: 15.4 g/dL — ABNORMAL HIGH (ref 12.0–15.0)
MCH: 31.2 pg (ref 26.0–34.0)
MCHC: 32.4 g/dL (ref 30.0–36.0)
MCV: 96.6 fL (ref 80.0–100.0)
Platelets: 199 10*3/uL (ref 150–400)
RBC: 4.93 MIL/uL (ref 3.87–5.11)
RDW: 13.5 % (ref 11.5–15.5)
WBC: 4.9 10*3/uL (ref 4.0–10.5)
nRBC: 0 % (ref 0.0–0.2)

## 2024-04-26 LAB — BASIC METABOLIC PANEL WITH GFR
Anion gap: 10 (ref 5–15)
BUN: 13 mg/dL (ref 8–23)
CO2: 25 mmol/L (ref 22–32)
Calcium: 9.6 mg/dL (ref 8.9–10.3)
Chloride: 106 mmol/L (ref 98–111)
Creatinine, Ser: 1.26 mg/dL — ABNORMAL HIGH (ref 0.44–1.00)
GFR, Estimated: 45 mL/min — ABNORMAL LOW (ref >60)
Glucose, Bld: 127 mg/dL — ABNORMAL HIGH (ref 70–99)
Potassium: 3.4 mmol/L — ABNORMAL LOW (ref 3.5–5.1)
Sodium: 141 mmol/L (ref 135–145)

## 2024-04-26 LAB — TROPONIN I (HIGH SENSITIVITY)
Troponin I (High Sensitivity): 6 ng/L (ref <18)
Troponin I (High Sensitivity): 5 ng/L (ref <18)

## 2024-04-26 MED ORDER — APIXABAN 5 MG PO TABS
5.0000 mg | ORAL_TABLET | Freq: Once | ORAL | Status: AC
  Administered 2024-04-26: 5 mg via ORAL
  Filled 2024-04-26: qty 1

## 2024-04-26 NOTE — ED Provider Triage Note (Signed)
 Emergency Medicine Provider Triage Evaluation Note  Claudia Cantrell , a 78 y.o. female  was evaluated in triage.  Pt complains of chest discomfort and afib  Review of Systems  Positive: weakness Negative: fever  Physical Exam  BP 103/71 (BP Location: Right Arm)   Pulse (!) 59   Temp 98.3 F (36.8 C)   Resp 19   Ht 5\' 5"  (1.651 m)   Wt 90.7 kg   SpO2 93%   BMI 33.27 kg/m  Gen:   Awake, no distress   Resp:  Normal effort  MSK:   Moves extremities without difficulty  Other:    Medical Decision Making  Medically screening exam initiated at 7:47 PM.  Appropriate orders placed.  LIBBI TOWNER was informed that the remainder of the evaluation will be completed by another provider, this initial triage assessment does not replace that evaluation, and the importance of remaining in the ED until their evaluation is complete.     Sandi Crosby, PA-C 04/26/24 1948

## 2024-04-26 NOTE — ED Triage Notes (Signed)
 Pt c/o progressing chest discomfort and SHOB x 2 weeks. States that she saw cards x 3 weeks ago, states that she knows she is in afib. Has an appointment with afib clinic next month.

## 2024-04-27 NOTE — Telephone Encounter (Signed)
 Patient states she is not taking fluconazole .

## 2024-04-27 NOTE — Addendum Note (Signed)
 Addended by: Tess Fife on: 04/27/2024 09:49 AM   Modules accepted: Orders

## 2024-04-27 NOTE — ED Provider Notes (Signed)
  EMERGENCY DEPARTMENT AT [facility] Provider Note   CSN: [id number] Arrival date & time: 04/26/24  1833     History  Chief Complaint  Patient presents with   Chest Pain    Claudia Cantrell is a 78 y.o. female.  78 year old female with history of atrial fibrillation on Eliquis  who presents ER today secondary to episode of lightheadedness, dyspnea and right-sided chest pain.  Patient states that she is always in A-fib which she is planning to be admitted for Tikosyn in a couple weeks.  She states that she has noticed that she is more short of breath with exertion.  And today she had been walk around quite a bit with her daughter shopping and she started to have a bit of right chest tightness, no palpitations, lightheadedness and short of breath.  Patient rested and got better.  She states has been traveling a lot lately internationally and may be overdoing it.  I reviewed the records she has A-fib on the last 2 EKGs so at least for a month.  Is anticoagulated.  No fever or cough.  No symptoms at this time.  She does note a little bit lower extremity edema that seems like it might be worsening.  most prominent in ankles she has as needed Lasix  at home but has not been taking it.   Chest Pain      Home Medications Prior to Admission medications   Medication Sig Start Date End Date Taking? Authorizing Provider  acetaminophen  (TYLENOL ) 500 MG tablet Take 1,000 mg by mouth every 6 (six) hours as needed for moderate pain.    [provider]  allopurinol  (ZYLOPRIM ) 100 MG tablet Take 1 tablet (100 mg total) by mouth daily. 09/17/22   Romayne Clubs, PA-C  amLODipine  (NORVASC ) 5 MG tablet Take 1.5 tablets (7.5 mg total) by mouth daily. 02/17/24   Millicent Ally, MD  apixaban  (ELIQUIS ) 5 MG TABS tablet Take 1 tablet by mouth twice daily 12/09/23   Millicent Ally, MD  carvedilol  (COREG ) 12.5 MG tablet TAKE 1 TABLET BY MOUTH TWICE DAILY WITH A MEAL 02/17/24   Alyson Back L, NP-C  COMIRNATY syringe  10/19/23   [provider]  cyanocobalamin (VITAMIN B12) 1000 MCG tablet Take 1,000 mcg by mouth daily.    [provider]  dapagliflozin propanediol (FARXIGA) 10 MG TABS tablet Take 10 mg by mouth in the morning. 01/13/21   [provider]  ezetimibe  (ZETIA ) 10 MG tablet Take 1 tablet (10 mg total) by mouth daily. 04/10/24 07/09/24  Millicent Ally, MD  FLUAD 0.5 ML injection  10/19/23   [provider]  fluconazole  (DIFLUCAN ) 150 MG tablet Take 150 mg by mouth once. 09/27/23   [provider]  furosemide  (LASIX ) 20 MG tablet Take 20 mg by mouth daily as needed for edema. 10/31/21   [provider]  isosorbide  dinitrate (ISORDIL ) 5 MG tablet Take 1 tablet by mouth twice daily 10/17/23   Camnitz, Babetta Lesch, MD  loratadine  (CLARITIN ) 10 MG tablet Take 1 tablet (10 mg total) by mouth daily. 03/18/24   Henson, Vickie L, NP-C  Multiple Vitamins-Minerals (OCUVITE ADULT 50+ PO) Take 1 tablet by mouth daily.    [provider]  olmesartan  (BENICAR ) 40 MG tablet Take 1 tablet (40 mg total) by mouth daily. 04/07/24   Camnitz, Babetta Lesch, MD  Probiotic Product (PROBIOTIC PO) Take 1 capsule by mouth 2 (two) times a week.  [provider]  rosuvastatin  (CRESTOR ) 40 MG tablet Take 1 tablet by mouth once daily 05/20/23   Camnitz, Babetta Lesch, MD  Semaglutide ,0.25 or 0.5MG /DOS, (OZEMPIC , 0.25 OR 0.5 MG/DOSE,) 2 MG/3ML SOPN Inject 0.5 mg into the skin once a week. Patient not taking: Reported on 04/07/2024 03/02/24   Shamleffer, Ibtehal Jaralla, MD  SYNTHROID  100 MCG tablet Take 1 tablet (100 mcg total) by mouth daily before breakfast. 03/03/24   Shamleffer, Ibtehal Jaralla, MD      Allergies    Labetalol and Codeine    Review of Systems   Review of Systems  Cardiovascular:  Positive for chest pain.    Physical Exam Updated Vital Signs BP 129/81   Pulse 78   Temp 97.9 F (36.6 C) (Oral)   Resp (!) 21    Ht 5\' 5"  (1.651 m)   Wt 90.7 kg   SpO2 100%   BMI 33.27 kg/m  Physical Exam Vitals and nursing note reviewed.  Constitutional:      Appearance: She is well-developed.  HENT:     Head: Normocephalic and atraumatic.  Cardiovascular:     Rate and Rhythm: Normal rate. Rhythm irregular.  Pulmonary:     Effort: No respiratory distress.     Breath sounds: No stridor.  Abdominal:     General: There is no distension.  Musculoskeletal:     Cervical back: Normal range of motion.     Right lower leg: Edema present.     Left lower leg: Edema present.  Neurological:     Mental Status: She is alert.     ED Results / Procedures / Treatments   Labs (all labs ordered are listed, but only abnormal results are displayed) Labs Reviewed  BASIC METABOLIC PANEL WITH GFR - Abnormal; Notable for the following components:      Result Value   Potassium 3.4 (*)    Glucose, Bld 127 (*)    Creatinine, Ser 1.26 (*)    GFR, Estimated 45 (*)    All other components within normal limits  CBC - Abnormal; Notable for the following components:   Hemoglobin 15.4 (*)    HCT 47.6 (*)    All other components within normal limits  TROPONIN I (HIGH SENSITIVITY)  TROPONIN I (HIGH SENSITIVITY)    EKG EKG Interpretation Date/Time:  Sunday Apr 26 2024 19:24:10 EDT Ventricular Rate:  87 PR Interval:    QRS Duration:  96 QT Interval:  366 QTC Calculation: 440 R Axis:   3  Text Interpretation: Atrial fibrillation Low voltage QRS Cannot rule out Anterior infarct , age undetermined Abnormal ECG When compared with ECG of 01-Apr-2024 09:29, PREVIOUS ECG IS PRESENT Confirmed by Eve Hinders [phone]) on 04/27/2024 12:04:37 AM  Radiology DG Chest 2 View Result Date: 04/26/2024 CLINICAL DATA:  chest pain EXAM: CHEST - 2 VIEW COMPARISON:  September 06, 2023 FINDINGS: The cardiomediastinal silhouette is unchanged and enlarged in contour. No pleural effusion. No pneumothorax. No acute pleuroparenchymal abnormality.  Visualized abdomen is unremarkable. Multilevel degenerative changes of the thoracic spine. IMPRESSION: No acute cardiopulmonary abnormality. Electronically Signed   By: Clancy Crimes M.D.   On: 04/26/2024 19:57    Procedures Procedures    Medications Ordered in ED Medications  apixaban  (ELIQUIS ) tablet 5 mg (5 mg Oral Given 04/26/24 2335)    ED Course/ Medical Decision Making/ A&P  Medical Decision Making Amount and/or Complexity of Data Reviewed Labs: ordered. Radiology: ordered.   Suspect overexertion with the afib as the likely cause for her symptosm at this time. Small amount of ankle edema but lungs clear, xr clear, not tachypneic or hypoxic, low suspicion for significant CHF exacerbation currently. Will take her lasix  for a few days and rest more. I will forward encounter to Dr. Lawana Pray for review but she already has scheduled hospitalization coming up so not sure if she needs that bumped sooner but no indication for admission or cards consult tonight.   CHA2DS2/VAS Stroke Risk Points  Current as of 57 minutes ago     6 >= 2 Points: High Risk  1 to 1.99 Points: Medium Risk  0 Points: Low Risk    No Change      Details    This score determines the patient's risk of having a stroke if the  patient has atrial fibrillation.       Points Metrics  1 Has Congestive Heart Failure:  Yes    Current as of 57 minutes ago  1 Has Vascular Disease:  Yes    Current as of 57 minutes ago  1 Has Hypertension:  Yes    Current as of 57 minutes ago  2 Age:  81    Current as of 57 minutes ago  0 Has Diabetes Excluding Gestational Diabetes:  No    Current as of 57 minutes ago  0 Had Stroke:  No  Had TIA:  No  Had Thromboembolism:  No    Current as of 57 minutes ago  1 Female:  Yes    Current as of 57 minutes ago         Final Clinical Impression(s) / ED Diagnoses Final diagnoses:  Atrial fibrillation, unspecified type [facility])    Rx / DC  Orders ED Discharge Orders     None         Trayshawn Durkin, Reymundo Caulk, MD 04/27/24 [id number]

## 2024-04-28 ENCOUNTER — Telehealth (HOSPITAL_COMMUNITY): Payer: Self-pay

## 2024-04-28 ENCOUNTER — Ambulatory Visit (INDEPENDENT_AMBULATORY_CARE_PROVIDER_SITE_OTHER)

## 2024-04-28 VITALS — BP 108/68 | HR 71 | Ht 65.0 in | Wt 201.6 lb

## 2024-04-28 DIAGNOSIS — Z0000 Encounter for general adult medical examination without abnormal findings: Secondary | ICD-10-CM | POA: Diagnosis not present

## 2024-04-28 NOTE — Patient Instructions (Signed)
 [name] , Thank you for taking time out of your busy schedule to complete your Annual Wellness Visit with me. I enjoyed our conversation and look forward to speaking with you again next year. I, as well as your care team,  appreciate your ongoing commitment to your health goals. Please review the following plan we discussed and let me know if I can assist you in the future. Your Game plan/ To Do List   Follow up Visits: Next Medicare AWV with our clinical staff: 05/06/2025   Have you seen your provider in the last 6 months (3 months if uncontrolled diabetes)? Yes Next Office Visit with your provider: 05/01/2024  Clinician Recommendations:  Aim for 30 minutes of exercise or brisk walking, 6-8 glasses of water, and 5 servings of fruits and vegetables each day. Patient plans to obtain an immunization report from [employer] to update the PCP's records.      This is a list of the screening recommended for you and due dates:  Health Maintenance  Topic Date Due   DTaP/Tdap/Td vaccine (1 - Tdap) Never done   COVID-19 Vaccine (5 - 2024-25 season) 08/11/2023   Cologuard (Stool DNA test)  04/28/2025*   Flu Shot  07/10/2024   Medicare Annual Wellness Visit  04/28/2025   Pneumonia Vaccine  Completed   DEXA scan (bone density measurement)  Completed   Hepatitis C Screening  Completed   Zoster (Shingles) Vaccine  Completed   HPV Vaccine  Aged Out   Meningitis B Vaccine  Aged Out   Colon Cancer Screening  Discontinued  *Topic was postponed. The date shown is not the original due date.    Advanced directives: (In Chart) A copy of your advanced directives are scanned into your chart should your provider ever need it. Advance Care Planning is important because it:  [x]  Makes sure you receive the medical care that is consistent with your values, goals, and preferences  [x]  It provides guidance to your family and loved ones and reduces their decisional burden about whether or not they are  making the right decisions based on your wishes.  Follow the link provided in your after visit summary or read over the paperwork we have mailed to you to help you started getting your Advance Directives in place. If you need assistance in completing these, please reach out to us  so that we can help you!

## 2024-04-28 NOTE — Telephone Encounter (Signed)
 No pre certification required for Tikosyn admission. Date of service: 05/18/2024

## 2024-04-28 NOTE — Progress Notes (Addendum)
 Subjective:  Please attest and cosign this visit due to patients primary care provider not being in the office at the time the visit was completed.  (Pt of Alyson Back, NP)   Claudia Cantrell is a 78 y.o. who presents for a Medicare Wellness preventive visit.  As a reminder, Annual Wellness Visits don't include a physical exam, and some assessments may be limited, especially if this visit is performed virtually. We may recommend an in-person follow-up visit with your provider if needed.  Visit Complete: In person  Persons Participating in Visit: Patient.  AWV Questionnaire: No: Patient Medicare AWV questionnaire was not completed prior to this visit.  Cardiac Risk Factors include: advanced age (>47men, >52 women);hypertension;dyslipidemia;obesity (BMI >30kg/m2)     Objective:     Today's Vitals   04/28/24 1416  BP: 108/68  Pulse: 71  SpO2: 97%  Weight: 201 lb 9.6 oz (91.4 kg)  Height: 5\' 5"  (1.651 m)   Body mass index is 33.55 kg/m.     04/28/2024    2:15 PM 04/26/2024    7:28 PM 07/08/2023    8:12 AM 06/21/2023    3:04 PM 06/12/2023   11:04 AM 12/14/2022    6:08 AM 09/14/2022   10:32 AM  Advanced Directives  Does Patient Have a Medical Advance Directive? Yes No No No No No No  Type of Estate agent of [place];Living will        Does patient want to make changes to medical advance directive? No - Patient declined        Copy of Healthcare Power of Attorney in Chart? Yes - validated most recent copy scanned in chart (See row information)        Would patient like information on creating a medical advance directive?   No - Patient declined No - Patient declined No - Patient declined No - Patient declined Yes (MAU/Ambulatory/Procedural Areas - Information given)    Current Medications (verified) Outpatient Encounter Medications as of 04/28/2024  Medication Sig   acetaminophen  (TYLENOL ) 500 MG tablet Take 1,000 mg by mouth every 6 (six) hours as needed for  moderate pain.   allopurinol  (ZYLOPRIM ) 100 MG tablet Take 1 tablet (100 mg total) by mouth daily.   amLODipine  (NORVASC ) 5 MG tablet Take 1.5 tablets (7.5 mg total) by mouth daily.   apixaban  (ELIQUIS ) 5 MG TABS tablet Take 1 tablet by mouth twice daily   carvedilol  (COREG ) 12.5 MG tablet TAKE 1 TABLET BY MOUTH TWICE DAILY WITH A MEAL   COMIRNATY syringe    cyanocobalamin (VITAMIN B12) 1000 MCG tablet Take 1,000 mcg by mouth daily.   dapagliflozin propanediol (FARXIGA) 10 MG TABS tablet Take 10 mg by mouth in the morning.   ezetimibe  (ZETIA ) 10 MG tablet Take 1 tablet (10 mg total) by mouth daily.   FLUAD 0.5 ML injection    furosemide  (LASIX ) 20 MG tablet Take 20 mg by mouth daily as needed for edema.   isosorbide  dinitrate (ISORDIL ) 5 MG tablet Take 1 tablet by mouth twice daily   loratadine  (CLARITIN ) 10 MG tablet Take 1 tablet (10 mg total) by mouth daily.   Multiple Vitamins-Minerals (OCUVITE ADULT 50+ PO) Take 1 tablet by mouth daily.   olmesartan  (BENICAR ) 40 MG tablet Take 1 tablet (40 mg total) by mouth daily.   Probiotic Product (PROBIOTIC PO) Take 1 capsule by mouth 2 (two) times a week.   rosuvastatin  (CRESTOR ) 40 MG tablet Take 1 tablet by mouth once daily  SYNTHROID  100 MCG tablet Take 1 tablet (100 mcg total) by mouth daily before breakfast.   Semaglutide ,0.25 or 0.5MG /DOS, (OZEMPIC , 0.25 OR 0.5 MG/DOSE,) 2 MG/3ML SOPN Inject 0.5 mg into the skin once a week. (Patient not taking: Reported on 04/28/2024)   No facility-administered encounter medications on file as of 04/28/2024.    Allergies (verified) Labetalol and Codeine   History: Past Medical History:  Diagnosis Date   Arthritis    rheumatoid   Back pain    CAD (coronary artery disease)    a. 1992 s/p MI and PTCA of unknown vessel;  b. 09/2010 Cath: LM nl, LAD 20p, LCX 2m, RCA 47m, RPL 20.   Cancer [facility])    Cervical cancer (HCC) 1977   Chest pain    Chronic kidney disease    Constipation    Family history of  anesthesia complication    daughter has difficulty waking    GERD (gastroesophageal reflux disease)    occ   Gout 10/08/2016   History of heart attack    Hyperlipidemia    Hypertensive heart disease    Hypothyroidism    Joint pain    Nonischemic cardiomyopathy (HCC)    a. 07/2016 Echo: EF 40-45%, mild LVH, inferior akinesis, moderately dilated left atrium, trivial AI and MR.   PAF (paroxysmal atrial fibrillation) (HCC)    a. 07/2016 Admitted w/ AF RVR-->CHA2DS2VASc = 5-->Eliquis ;  b. 07/2016 successful TEE/DCCV.   Pre-diabetes    Sleep apnea    a. Using CPAP.   SOB (shortness of breath)    Vitamin D  deficiency    Past Surgical History:  Procedure Laterality Date   ABDOMINAL HYSTERECTOMY  12/10/1986   ATRIAL FIBRILLATION ABLATION N/A 07/28/2021   Procedure: ATRIAL FIBRILLATION ABLATION;  Surgeon: Lei Pump, MD;  Location: [facility];  Service: Cardiovascular;  Laterality: N/A;   ATRIAL FIBRILLATION ABLATION N/A 12/14/2022   Procedure: ATRIAL FIBRILLATION ABLATION;  Surgeon: Lei Pump, MD;  Location: [facility];  Service: Cardiovascular;  Laterality: N/A;   BACK SURGERY  12/10/1992   BIOPSY  09/02/2019   Procedure: BIOPSY;  Surgeon: Brice Campi Albino Alu., MD;  Location: [facility];  Service: Gastroenterology;;   BIOPSY  12/09/2019   Procedure: BIOPSY;  Surgeon: Normie Becton., MD;  Location: [facility];  Service: Gastroenterology;;   BIOPSY  03/08/2021   Procedure: BIOPSY;  Surgeon: Normie Becton., MD;  Location: [facility];  Service: Gastroenterology;;   BIOPSY  07/08/2023   Procedure: BIOPSY;  Surgeon: Normie Becton., MD;  Location: [facility];  Service: Gastroenterology;;   CARDIAC CATHETERIZATION  1991 AND 1992   PTCA BY DR Mahlon Schwab   CARDIOVERSION N/A 08/01/2016   Procedure: CARDIOVERSION;  Surgeon: Lenise Quince, MD;  Location: [facility] ENDOSCOPY;  Service: Cardiovascular;  Laterality: N/A;    CARDIOVERSION N/A 01/11/2020   Procedure: CARDIOVERSION;  Surgeon: Loyde Rule, MD;  Location: [facility] ENDOSCOPY;  Service: Cardiovascular;  Laterality: N/A;   CARDIOVERSION N/A 07/08/2020   Procedure: CARDIOVERSION;  Surgeon: Wendie Hamburg, MD;  Location: [facility] ENDOSCOPY;  Service: Cardiovascular;  Laterality: N/A;   CARDIOVERSION N/A 05/31/2022   Procedure: CARDIOVERSION;  Surgeon: Hazle Lites, MD;  Location: [facility] ENDOSCOPY;  Service: Cardiovascular;  Laterality: N/A;   CARDIOVERSION N/A 07/18/2022   Procedure: CARDIOVERSION;  Surgeon: Wendie Hamburg, MD;  Location: [facility] ENDOSCOPY;  Service: Cardiovascular;  Laterality: N/A;   CATARACT EXTRACTION  2024   June/July 2024 bilateral eyes   COLONOSCOPY  07/2023   ENDOSCOPIC MUCOSAL RESECTION N/A 12/09/2019   Procedure: ENDOSCOPIC MUCOSAL RESECTION;  Surgeon: Brice Campi Albino Alu., MD;  Location: [facility] ENDOSCOPY;  Service: Gastroenterology;  Laterality: N/A;   ESOPHAGOGASTRODUODENOSCOPY N/A 03/08/2021   Procedure: ESOPHAGOGASTRODUODENOSCOPY (EGD);  Surgeon: Normie Becton., MD;  Location: [facility];  Service: Gastroenterology;  Laterality: N/A;   ESOPHAGOGASTRODUODENOSCOPY (EGD) WITH PROPOFOL  N/A 09/02/2019   Procedure: ESOPHAGOGASTRODUODENOSCOPY (EGD) WITH PROPOFOL ;  Surgeon: Brice Campi Albino Alu., MD;  Location: [facility] ENDOSCOPY;  Service: Gastroenterology;  Laterality: N/A;   ESOPHAGOGASTRODUODENOSCOPY (EGD) WITH PROPOFOL  N/A 12/09/2019   Procedure: ESOPHAGOGASTRODUODENOSCOPY (EGD) WITH PROPOFOL ;  Surgeon: Brice Campi Albino Alu., MD;  Location: [facility] ENDOSCOPY;  Service: Gastroenterology;  Laterality: N/A;   ESOPHAGOGASTRODUODENOSCOPY (EGD) WITH PROPOFOL  N/A 07/08/2023   Procedure: ESOPHAGOGASTRODUODENOSCOPY (EGD) WITH PROPOFOL ;  Surgeon: Brice Campi Albino Alu., MD;  Location: [facility] ENDOSCOPY;  Service: Gastroenterology;  Laterality: N/A;   EUS N/A 09/02/2019   Procedure: UPPER ENDOSCOPIC ULTRASOUND (EUS) RADIAL;  Surgeon:  Normie Becton., MD;  Location: [facility] ENDOSCOPY;  Service: Gastroenterology;  Laterality: N/A;  EUS radial/linear   EUS N/A 12/09/2019   Procedure: UPPER ENDOSCOPIC ULTRASOUND (EUS) RADIAL;  Surgeon: Normie Becton., MD;  Location: [facility] ENDOSCOPY;  Service: Gastroenterology;  Laterality: N/A;   EUS  12/09/2019   Procedure: UPPER ENDOSCOPIC ULTRASOUND (EUS) LINEAR;  Surgeon: Normie Becton., MD;  Location: [facility];  Service: Gastroenterology;;   EUS N/A 03/08/2021   Procedure: UPPER ENDOSCOPIC ULTRASOUND (EUS) RADIAL;  Surgeon: Normie Becton., MD;  Location: [facility];  Service: Gastroenterology;  Laterality: N/A;   FINE NEEDLE ASPIRATION  09/02/2019   Procedure: FINE NEEDLE ASPIRATION (FNA) LINEAR;  Surgeon: Normie Becton., MD;  Location: [facility];  Service: Gastroenterology;;   FINE NEEDLE ASPIRATION  12/09/2019   Procedure: FINE NEEDLE ASPIRATION (FNA) LINEAR;  Surgeon: Normie Becton., MD;  Location: [facility];  Service: Gastroenterology;;   HEMOSTASIS CLIP PLACEMENT  12/09/2019   Procedure: HEMOSTASIS CLIP PLACEMENT;  Surgeon: Normie Becton., MD;  Location: [facility];  Service: Gastroenterology;;   ORIF ANKLE FRACTURE Right 04/27/2015   Procedure: OPEN REDUCTION INTERNAL FIXATION (ORIF) RIGHT BIMALLEOLAR ANKLE FRACTURE WITH SYNDESMOSIS FIXATION;  Surgeon: Wes Hamman, MD;  Location: [facility] OR;  Service: Orthopedics;  Laterality: Right;   SUBMUCOSAL LIFTING INJECTION  12/09/2019   Procedure: SUBMUCOSAL LIFTING INJECTION;  Surgeon: Normie Becton., MD;  Location: [facility] ENDOSCOPY;  Service: Gastroenterology;;   TEE WITHOUT CARDIOVERSION N/A 08/01/2016   Procedure: TRANSESOPHAGEAL ECHOCARDIOGRAM (TEE);  Surgeon: Lenise Quince, MD;  Location: [facility] ENDOSCOPY;  Service: Cardiovascular;  Laterality: N/A;   THYROIDECTOMY  11/24/2013   DR Lauralee Poll   THYROIDECTOMY N/A 11/24/2013   Procedure: TOTAL THYROIDECTOMY;  Surgeon: Levert Ready, MD;  Location: [facility] OR;  Service: General;  Laterality: N/A;   TRANSTHORACIC ECHOCARDIOGRAM  01/15/2013   EF 55% TO 65%. PROBABLE MILD HYPOKINESIS OF THE INFERIOR MYOCARDIUM. GRADE 1 DIASTOLIC DYSFUNCTION. TRIAL AR.LA IS MILDLY DILATED.   UPPER ESOPHAGEAL ENDOSCOPIC ULTRASOUND (EUS) N/A 07/08/2023   Procedure: UPPER ESOPHAGEAL ENDOSCOPIC ULTRASOUND (EUS);  Surgeon: Normie Becton., MD;  Location: [facility];  Service: Gastroenterology;  Laterality: N/A;   Family History  Problem Relation Age of Onset   Heart disease Mother    Sudden death Mother    Diabetes Father    Stroke Father    Bone cancer Sister    Sudden death Brother    Melanoma Brother    Heart disease Brother    Colon cancer Neg Hx  Esophageal cancer Neg Hx    Inflammatory bowel disease Neg Hx    Liver disease Neg Hx    Pancreatic cancer Neg Hx    Rectal cancer Neg Hx    Stomach cancer Neg Hx    Social History   Socioeconomic History   Marital status: Widowed    Spouse name: Not on file   Number of children: 3   Years of education: Not on file   Highest education level: Not on file  Occupational History   Occupation: retired  Tobacco Use   Smoking status: Former    Current packs/day: 0.00    Average packs/day: 1 pack/day for 15.0 years (15.0 ttl pk-yrs)    Types: Cigarettes    Start date: 12/11/1975    Quit date: 12/10/1990    Years since quitting: 33.4    Passive exposure: Never   Smokeless tobacco: Never   Tobacco comments:    Former smoker 05/21/22  Vaping Use   Vaping status: Never Used  Substance and Sexual Activity   Alcohol use: Yes    Comment: very rare   Drug use: No   Sexual activity: Not Currently  Other Topics Concern   Not on file  Social History Narrative   Right handed    Living  alone.   Social Drivers of Corporate investment banker Strain: Low Risk  (04/28/2024)   Overall Financial Resource Strain (CARDIA)    Difficulty of Paying Living Expenses: Not very hard   Food Insecurity: No Food Insecurity (04/28/2024)   Hunger Vital Sign    Worried About Running Out of Food in the Last Year: Never true    Ran Out of Food in the Last Year: Never true  Transportation Needs: No Transportation Needs (04/28/2024)   PRAPARE - Administrator, Civil Service (Medical): No    Lack of Transportation (Non-Medical): No  Physical Activity: Inactive (04/28/2024)   Exercise Vital Sign    Days of Exercise per Week: 0 days    Minutes of Exercise per Session: 0 min  Stress: No Stress Concern Present (04/28/2024)   [employer] of Occupational Health - Occupational Stress Questionnaire    Feeling of Stress : Not at all  Social Connections: Socially Isolated (04/28/2024)   Social Connection and Isolation Panel [NHANES]    Frequency of Communication with Friends and Family: Twice a week    Frequency of Social Gatherings with Friends and Family: Twice a week    Attends Religious Services: Never    Database administrator or Organizations: No    Attends Banker Meetings: Never    Marital Status: Widowed    Tobacco Counseling Counseling given: No Tobacco comments: Former smoker 05/21/22    Clinical Intake:  Pre-visit preparation completed: Yes  Pain : No/denies pain     BMI - recorded: 33.55 Nutritional Status: BMI > 30  Obese Nutritional Risks: None Diabetes: No  Lab Results  Component Value Date   HGBA1C 5.6 03/02/2024   HGBA1C 5.6 09/02/2023   HGBA1C 6.0 02/20/2023     How often do you need to have someone help you when you read instructions, pamphlets, or other written materials from your doctor or pharmacy?: 1 - Never  Interpreter Needed?: No  Information entered by :: Kandy Orris, CMA   Activities of Daily Living     04/28/2024    2:22 PM 06/21/2023    3:03 PM  In your present state of health, do you have  any difficulty performing the following activities:  Hearing? 0   Vision? 0 0  Difficulty concentrating or  making decisions? 0   Walking or climbing stairs? 0   Dressing or bathing? 0   Doing errands, shopping? 0   Preparing Food and eating ? N N  Using the Toilet? N N  In the past six months, have you accidently leaked urine? N N  Do you have problems with loss of bowel control? N   Managing your Medications? N N  Managing your Finances? N N  Housekeeping or managing your Housekeeping? N N    Patient Care Team: Abram Abraham, NP-C as PCP - General (Family Medicine) Lei Pump, MD as PCP - Electrophysiology (Cardiology) Phebe Brasil, OD (Optometry)  Indicate any recent Medical Services you may have received from other than [facility] providers in the past year (date may be approximate).     Assessment:    This is a routine wellness examination for Ilsa.  Hearing/Vision screen Hearing Screening - Comments:: Denies hearing difficulties   Vision Screening - Comments:: Wears rx glasses - up to date with routine eye exams with Dr Frosty Jews   Goals Addressed               This Visit's Progress     Weight (lb) < 200 lb (90.7 kg) (pt-stated)   201 lb 9.6 oz (91.4 kg)     Patient stated that she wants to lose weight.         Depression Screen     04/28/2024    2:23 PM 03/18/2024    1:31 PM 09/27/2023    1:56 PM 09/06/2023   11:08 AM 06/21/2023    3:11 PM 06/21/2023    3:04 PM 03/29/2023    2:21 PM  PHQ 2/9 Scores  PHQ - 2 Score 2 0 0 0 0 0 0  PHQ- 9 Score 8          Fall Risk     04/28/2024    2:23 PM 03/18/2024    1:31 PM 09/06/2023   11:07 AM 06/21/2023    2:58 PM 03/29/2023    2:21 PM  Fall Risk   Falls in the past year? 0 0 0 0 0  Number falls in past yr: 0 0 0 0 0  Injury with Fall? 0 0 0 0 0  Risk for fall due to : No Fall Risks No Fall Risks No Fall Risks No Fall Risks No Fall Risks  Follow up Falls evaluation completed Falls evaluation completed Falls evaluation completed Falls evaluation completed Falls evaluation completed    MEDICARE RISK AT  HOME:  Medicare Risk at Home Any stairs in or around the home?: Yes If so, are there any without handrails?: No Home free of loose throw rugs in walkways, pet beds, electrical cords, etc?: Yes Adequate lighting in your home to reduce risk of falls?: Yes Life alert?: No Use of a cane, walker or w/c?: No Grab bars in the bathroom?: No Shower chair or bench in shower?: No Elevated toilet seat or a handicapped toilet?: No  TIMED UP AND GO:  Was the test performed?  No  Cognitive Function: 6CIT completed        04/28/2024    2:26 PM 06/21/2023    2:59 PM  6CIT Screen  What Year? 0 points 0 points  What month? 0 points 0 points  What time? 0 points 0 points  Count back from 20  0 points 0 points  Months in reverse 0 points 0 points  Repeat phrase 0 points 0 points  Total Score 0 points 0 points    Immunizations Immunization History  Administered Date(s) Administered   Fluad Quad(high Dose 65+) 08/22/2019, 09/20/2021   Influenza Nasal 11/25/2013   Influenza, High Dose Seasonal PF 12/30/2014, 10/14/2017, 11/01/2018   Influenza,inj,Quad PF,6+ Mos 11/25/2013   Influenza-Unspecified 12/30/2012, 11/09/2015, 12/24/2016, 09/16/2020   PFIZER(Purple Top)SARS-COV-2 Vaccination 12/29/2019, 01/18/2020, 09/16/2020   PNEUMOCOCCAL CONJUGATE-20 12/26/2023   Pfizer Covid-19 Vaccine Bivalent Booster 5y-11y 10/21/2021   Pneumococcal Conjugate-13 12/30/2014, 10/14/2017   Pneumococcal Polysaccharide-23 11/25/2013, 08/22/2019   Zoster Recombinant(Shingrix) 10/14/2017, 01/21/2018   Zoster, Live 12/15/2008    Screening Tests Health Maintenance  Topic Date Due   DTaP/Tdap/Td (1 - Tdap) Never done   COVID-19 Vaccine (5 - 2024-25 season) 08/11/2023   Fecal DNA (Cologuard)  04/28/2025 (Originally 07/01/1993)   INFLUENZA VACCINE  07/10/2024   Medicare Annual Wellness (AWV)  04/28/2025   Pneumonia Vaccine 9+ Years old  Completed   DEXA SCAN  Completed   Hepatitis C Screening  Completed   Zoster  Vaccines- Shingrix  Completed   HPV VACCINES  Aged Out   Meningococcal B Vaccine  Aged Out   Colonoscopy  Discontinued    Health Maintenance  Health Maintenance Due  Topic Date Due   DTaP/Tdap/Td (1 - Tdap) Never done   COVID-19 Vaccine (5 - 2024-25 season) 08/11/2023   Health Maintenance Items Addressed:  Pt stated she'd had a Tdap (Tetenus) vaccines at [facility] - asked pt to obtain an immunization report to update PCP's records.  Additional Screening:  Vision Screening: Recommended annual ophthalmology exams for early detection of glaucoma and other disorders of the eye.  Dental Screening: Recommended annual dental exams for proper oral hygiene  Community Resource Referral / Chronic Care Management: CRR required this visit?  No   CCM required this visit?  No   Plan:    I have personally reviewed and noted the following in the patient's chart:   Medical and social history Use of alcohol, tobacco or illicit drugs  Current medications and supplements including opioid prescriptions. Patient is not currently taking opioid prescriptions. Functional ability and status Nutritional status Physical activity Advanced directives List of other physicians Hospitalizations, surgeries, and ER visits in previous 12 months Vitals Screenings to include cognitive, depression, and falls Referrals and appointments  In addition, I have reviewed and discussed with patient certain preventive protocols, quality metrics, and best practice recommendations. A written personalized care plan for preventive services as well as general preventive health recommendations were provided to patient.   Patria Bookbinder, CMA   04/28/2024   After Visit Summary: (In Person-Declined) Patient declined AVS at this time.  Notes: Nothing significant to report at this time.   Medical screening examination/treatment/procedure(s) were performed by non-physician practitioner and as supervising physician I  was immediately available for consultation/collaboration.  I agree with above. Adelaide Holy, MD

## 2024-05-01 ENCOUNTER — Encounter: Payer: Self-pay | Admitting: Family Medicine

## 2024-05-01 ENCOUNTER — Ambulatory Visit (INDEPENDENT_AMBULATORY_CARE_PROVIDER_SITE_OTHER): Admitting: Family Medicine

## 2024-05-01 ENCOUNTER — Ambulatory Visit: Payer: Self-pay | Admitting: Family Medicine

## 2024-05-01 VITALS — BP 128/84 | HR 73 | Temp 97.9°F | Ht 65.0 in | Wt 201.0 lb

## 2024-05-01 DIAGNOSIS — D6869 Other thrombophilia: Secondary | ICD-10-CM

## 2024-05-01 DIAGNOSIS — R0602 Shortness of breath: Secondary | ICD-10-CM

## 2024-05-01 DIAGNOSIS — I4819 Other persistent atrial fibrillation: Secondary | ICD-10-CM | POA: Diagnosis not present

## 2024-05-01 DIAGNOSIS — E785 Hyperlipidemia, unspecified: Secondary | ICD-10-CM

## 2024-05-01 DIAGNOSIS — I10 Essential (primary) hypertension: Secondary | ICD-10-CM

## 2024-05-01 DIAGNOSIS — G4733 Obstructive sleep apnea (adult) (pediatric): Secondary | ICD-10-CM | POA: Diagnosis not present

## 2024-05-01 DIAGNOSIS — R7303 Prediabetes: Secondary | ICD-10-CM

## 2024-05-01 DIAGNOSIS — R002 Palpitations: Secondary | ICD-10-CM

## 2024-05-01 DIAGNOSIS — R5383 Other fatigue: Secondary | ICD-10-CM | POA: Diagnosis not present

## 2024-05-01 LAB — CBC
HCT: 47.3 % — ABNORMAL HIGH (ref 36.0–46.0)
Hemoglobin: 15.5 g/dL — ABNORMAL HIGH (ref 12.0–15.0)
MCHC: 32.8 g/dL (ref 30.0–36.0)
MCV: 94.6 fl (ref 78.0–100.0)
Platelets: 197 10*3/uL (ref 150.0–400.0)
RBC: 5 Mil/uL (ref 3.87–5.11)
RDW: 14.5 % (ref 11.5–15.5)
WBC: 3.3 10*3/uL — ABNORMAL LOW (ref 4.0–10.5)

## 2024-05-01 LAB — URINALYSIS, ROUTINE W REFLEX MICROSCOPIC
Bilirubin Urine: NEGATIVE
Ketones, ur: NEGATIVE
Leukocytes,Ua: NEGATIVE
Nitrite: NEGATIVE
Specific Gravity, Urine: 1.015 (ref 1.000–1.030)
Total Protein, Urine: 100 — AB
Urine Glucose: >=1000 — AB
Urobilinogen, UA: 0.2 (ref 0.0–1.0)
pH: 7 (ref 5.0–8.0)

## 2024-05-01 LAB — BASIC METABOLIC PANEL WITH GFR
BUN: 10 mg/dL (ref 6–23)
CO2: 30 meq/L (ref 19–32)
Calcium: 10.1 mg/dL (ref 8.4–10.5)
Chloride: 106 meq/L (ref 96–112)
Creatinine, Ser: 0.94 mg/dL (ref 0.40–1.20)
GFR: 59.22 mL/min — ABNORMAL LOW (ref >60.00)
Glucose, Bld: 90 mg/dL (ref 70–99)
Potassium: 4.1 meq/L (ref 3.5–5.1)
Sodium: 143 meq/L (ref 135–145)

## 2024-05-01 LAB — D-DIMER, QUANTITATIVE: D-Dimer, Quant: 0.26 ug{FEU}/mL (ref <0.50)

## 2024-05-01 LAB — MAGNESIUM: Magnesium: 2.3 mg/dL (ref 1.5–2.5)

## 2024-05-01 LAB — HEMOGLOBIN A1C: Hgb A1c MFr Bld: 6 % (ref 4.6–6.5)

## 2024-05-01 NOTE — Progress Notes (Unsigned)
 Subjective:     Patient ID: Claudia Cantrell, female    DOB: [birth year], 78 y.o.   MRN: [id number]  Chief Complaint  Patient presents with   Shortness of Breath    SOB with exertion for over a month. In a-fib, was at Dr. Arlana Bellini office a month ago and he told her that.  Scheduled to get on a medication for where she will need to stay in hospital for observation instead of cardiac ablastin.     Shortness of Breath Pertinent negatives include no abdominal pain, chest pain, claudication, fever, headaches, leg swelling, orthopnea, sore throat, sputum production, vomiting or wheezing.    History of Present Illness         C/o fatigue and shortness of breath with exertion. She was evaluated in the ED for the same. Denies any new symptoms today. Under the care of cardiology. A-fib and on Eliquis . She is planning to be admitted for Tikosyn in a couple weeks   Negative chest X ray 04/26/2024  She has OSA. Reports traveling to [place] a few weeks ago and was there for approximately 3-4 weeks and did not take her CPAP.     Health Maintenance Due  Topic Date Due   DTaP/Tdap/Td (1 - Tdap) Never done   COVID-19 Vaccine (5 - 2024-25 season) 08/11/2023    Past Medical History:  Diagnosis Date   Arthritis    rheumatoid   Back pain    CAD (coronary artery disease)    a. 1992 s/p MI and PTCA of unknown vessel;  b. 09/2010 Cath: LM nl, LAD 20p, LCX 51m, RCA 65m, RPL 20.   Cancer [facility])    Cervical cancer (HCC) 1977   Chest pain    Chronic kidney disease    Constipation    Family history of anesthesia complication    daughter has difficulty waking    GERD (gastroesophageal reflux disease)    occ   Gout 10/08/2016   History of heart attack    Hyperlipidemia    Hypertensive heart disease    Hypothyroidism    Joint pain    Nonischemic cardiomyopathy (HCC)    a. 07/2016 Echo: EF 40-45%, mild LVH, inferior akinesis, moderately dilated left atrium, trivial AI and MR.   PAF  (paroxysmal atrial fibrillation) (HCC)    a. 07/2016 Admitted w/ AF RVR-->CHA2DS2VASc = 5-->Eliquis ;  b. 07/2016 successful TEE/DCCV.   Pre-diabetes    Sleep apnea    a. Using CPAP.   SOB (shortness of breath)    Vitamin D  deficiency     Past Surgical History:  Procedure Laterality Date   ABDOMINAL HYSTERECTOMY  12/10/1986   ATRIAL FIBRILLATION ABLATION N/A 07/28/2021   Procedure: ATRIAL FIBRILLATION ABLATION;  Surgeon: Lei Pump, MD;  Location: [facility];  Service: Cardiovascular;  Laterality: N/A;   ATRIAL FIBRILLATION ABLATION N/A 12/14/2022   Procedure: ATRIAL FIBRILLATION ABLATION;  Surgeon: Lei Pump, MD;  Location: [facility];  Service: Cardiovascular;  Laterality: N/A;   BACK SURGERY  12/10/1992   BIOPSY  09/02/2019   Procedure: BIOPSY;  Surgeon: Brice Campi Albino Alu., MD;  Location: [facility];  Service: Gastroenterology;;   BIOPSY  12/09/2019   Procedure: BIOPSY;  Surgeon: Normie Becton., MD;  Location: [facility];  Service: Gastroenterology;;   BIOPSY  03/08/2021   Procedure: BIOPSY;  Surgeon: Normie Becton., MD;  Location: [facility] ENDOSCOPY;  Service: Gastroenterology;;   BIOPSY  07/08/2023   Procedure: BIOPSY;  Surgeon: Brice Campi Albino Alu., MD;  Location: [facility];  Service: Gastroenterology;;   CARDIAC CATHETERIZATION  1991 AND 1992   PTCA BY DR Mahlon Schwab   CARDIOVERSION N/A 08/01/2016   Procedure: CARDIOVERSION;  Surgeon: Lenise Quince, MD;  Location: [facility] ENDOSCOPY;  Service: Cardiovascular;  Laterality: N/A;   CARDIOVERSION N/A 01/11/2020   Procedure: CARDIOVERSION;  Surgeon: Loyde Rule, MD;  Location: [facility] ENDOSCOPY;  Service: Cardiovascular;  Laterality: N/A;   CARDIOVERSION N/A 07/08/2020   Procedure: CARDIOVERSION;  Surgeon: Wendie Hamburg, MD;  Location: [facility] ENDOSCOPY;  Service: Cardiovascular;  Laterality: N/A;   CARDIOVERSION N/A 05/31/2022   Procedure: CARDIOVERSION;  Surgeon:  Hazle Lites, MD;  Location: [facility] ENDOSCOPY;  Service: Cardiovascular;  Laterality: N/A;   CARDIOVERSION N/A 07/18/2022   Procedure: CARDIOVERSION;  Surgeon: Wendie Hamburg, MD;  Location: [facility] ENDOSCOPY;  Service: Cardiovascular;  Laterality: N/A;   CATARACT EXTRACTION  2024   June/July 2024 bilateral eyes   COLONOSCOPY  07/2023   ENDOSCOPIC MUCOSAL RESECTION N/A 12/09/2019   Procedure: ENDOSCOPIC MUCOSAL RESECTION;  Surgeon: Normie Becton., MD;  Location: [facility] ENDOSCOPY;  Service: Gastroenterology;  Laterality: N/A;   ESOPHAGOGASTRODUODENOSCOPY N/A 03/08/2021   Procedure: ESOPHAGOGASTRODUODENOSCOPY (EGD);  Surgeon: Normie Becton., MD;  Location: [facility];  Service: Gastroenterology;  Laterality: N/A;   ESOPHAGOGASTRODUODENOSCOPY (EGD) WITH PROPOFOL  N/A 09/02/2019   Procedure: ESOPHAGOGASTRODUODENOSCOPY (EGD) WITH PROPOFOL ;  Surgeon: Brice Campi Albino Alu., MD;  Location: [facility] ENDOSCOPY;  Service: Gastroenterology;  Laterality: N/A;   ESOPHAGOGASTRODUODENOSCOPY (EGD) WITH PROPOFOL  N/A 12/09/2019   Procedure: ESOPHAGOGASTRODUODENOSCOPY (EGD) WITH PROPOFOL ;  Surgeon: Brice Campi Albino Alu., MD;  Location: [facility] ENDOSCOPY;  Service: Gastroenterology;  Laterality: N/A;   ESOPHAGOGASTRODUODENOSCOPY (EGD) WITH PROPOFOL  N/A 07/08/2023   Procedure: ESOPHAGOGASTRODUODENOSCOPY (EGD) WITH PROPOFOL ;  Surgeon: Brice Campi Albino Alu., MD;  Location: [facility] ENDOSCOPY;  Service: Gastroenterology;  Laterality: N/A;   EUS N/A 09/02/2019   Procedure: UPPER ENDOSCOPIC ULTRASOUND (EUS) RADIAL;  Surgeon: Normie Becton., MD;  Location: [facility] ENDOSCOPY;  Service: Gastroenterology;  Laterality: N/A;  EUS radial/linear   EUS N/A 12/09/2019   Procedure: UPPER ENDOSCOPIC ULTRASOUND (EUS) RADIAL;  Surgeon: Normie Becton., MD;  Location: [facility] ENDOSCOPY;  Service: Gastroenterology;  Laterality: N/A;   EUS  12/09/2019   Procedure: UPPER ENDOSCOPIC ULTRASOUND (EUS) LINEAR;  Surgeon:  Normie Becton., MD;  Location: [facility];  Service: Gastroenterology;;   EUS N/A 03/08/2021   Procedure: UPPER ENDOSCOPIC ULTRASOUND (EUS) RADIAL;  Surgeon: Normie Becton., MD;  Location: [facility];  Service: Gastroenterology;  Laterality: N/A;   FINE NEEDLE ASPIRATION  09/02/2019   Procedure: FINE NEEDLE ASPIRATION (FNA) LINEAR;  Surgeon: Normie Becton., MD;  Location: [facility]COPY;  Service: Gastroenterology;;   FINE NEEDLE ASPIRATION  12/09/2019   Procedure: FINE NEEDLE ASPIRATION (FNA) LINEAR;  Surgeon: Normie Becton., MD;  Location: [facility];  Service: Gastroenterology;;   HEMOSTASIS CLIP PLACEMENT  12/09/2019   Procedure: HEMOSTASIS CLIP PLACEMENT;  Surgeon: Normie Becton., MD;  Location: [facility];  Service: Gastroenterology;;   ORIF ANKLE FRACTURE Right 04/27/2015   Procedure: OPEN REDUCTION INTERNAL FIXATION (ORIF) RIGHT BIMALLEOLAR ANKLE FRACTURE WITH SYNDESMOSIS FIXATION;  Surgeon: Wes Hamman, MD;  Location: [facility] OR;  Service: Orthopedics;  Laterality: Right;   SUBMUCOSAL LIFTING INJECTION  12/09/2019   Procedure: SUBMUCOSAL LIFTING INJECTION;  Surgeon: Normie Becton., MD;  Location: [facility] ENDOSCOPY;  Service: Gastroenterology;;   TEE WITHOUT CARDIOVERSION N/A 08/01/2016   Procedure: TRANSESOPHAGEAL ECHOCARDIOGRAM (TEE);  Surgeon: Lenise Quince, MD;  Location: [facility] ENDOSCOPY;  Service: Cardiovascular;  Laterality: N/A;   THYROIDECTOMY  11/24/2013   DR Lauralee Poll   THYROIDECTOMY N/A 11/24/2013   Procedure: TOTAL THYROIDECTOMY;  Surgeon: Levert Ready, MD;  Location: [facility] OR;  Service: General;  Laterality: N/A;   TRANSTHORACIC ECHOCARDIOGRAM  01/15/2013   EF 55% TO 65%. PROBABLE MILD HYPOKINESIS OF THE INFERIOR MYOCARDIUM. GRADE 1 DIASTOLIC DYSFUNCTION. TRIAL AR.LA IS MILDLY DILATED.   UPPER ESOPHAGEAL ENDOSCOPIC ULTRASOUND (EUS) N/A 07/08/2023   Procedure: UPPER ESOPHAGEAL ENDOSCOPIC ULTRASOUND (EUS);  Surgeon: Normie Becton., MD;  Location: [facility];  Service: Gastroenterology;  Laterality: N/A;    Family History  Problem Relation Age of Onset   Heart disease Mother    Sudden death Mother    Diabetes Father    Stroke Father    Bone cancer Sister    Sudden death Brother    Melanoma Brother    Heart disease Brother    Colon cancer Neg Hx    Esophageal cancer Neg Hx    Inflammatory bowel disease Neg Hx    Liver disease Neg Hx    Pancreatic cancer Neg Hx    Rectal cancer Neg Hx    Stomach cancer Neg Hx     Social History   Socioeconomic History   Marital status: Widowed    Spouse name: Not on file   Number of children: 3   Years of education: Not on file   Highest education level: Not on file  Occupational History   Occupation: retired  Tobacco Use   Smoking status: Former    Current packs/day: 0.00    Average packs/day: 1 pack/day for 15.0 years (15.0 ttl pk-yrs)    Types: Cigarettes    Start date: 12/11/1975    Quit date: 12/10/1990    Years since quitting: 33.4    Passive exposure: Never   Smokeless tobacco: Never   Tobacco comments:    Former smoker 05/21/22  Vaping Use   Vaping status: Never Used  Substance and Sexual Activity   Alcohol use: Yes    Comment: very rare   Drug use: No   Sexual activity: Not Currently  Other Topics Concern   Not on file  Social History Narrative   Right handed    Living  alone.   Social Drivers of Corporate investment banker Strain: Low Risk  (04/28/2024)   Overall Financial Resource Strain (CARDIA)    Difficulty of Paying Living Expenses: Not very hard  Food Insecurity: No Food Insecurity (04/28/2024)   Hunger Vital Sign    Worried About Running Out of Food in the Last Year: Never true    Ran Out of Food in the Last Year: Never true  Transportation Needs: No Transportation Needs (04/28/2024)   PRAPARE - Administrator, Civil Service (Medical): No    Lack of Transportation (Non-Medical): No  Physical Activity:  Inactive (04/28/2024)   Exercise Vital Sign    Days of Exercise per Week: 0 days    Minutes of Exercise per Session: 0 min  Stress: No Stress Concern Present (04/28/2024)   [employer] of Occupational Health - Occupational Stress Questionnaire    Feeling of Stress : Not at all  Social Connections: Socially Isolated (04/28/2024)   Social Connection and Isolation Panel [NHANES]    Frequency of Communication with Friends and Family: Twice a week    Frequency of Social Gatherings with Friends and Family: Twice a week    Attends Religious  Services: Never    Active Member of Clubs or Organizations: No    Attends Banker Meetings: Never    Marital Status: Widowed  Intimate Partner Violence: Not At Risk (04/28/2024)   Humiliation, Afraid, Rape, and Kick questionnaire    Fear of Current or Ex-Partner: No    Emotionally Abused: No    Physically Abused: No    Sexually Abused: No    Outpatient Medications Prior to Visit  Medication Sig Dispense Refill   acetaminophen  (TYLENOL ) 500 MG tablet Take 1,000 mg by mouth every 6 (six) hours as needed for moderate pain.     allopurinol  (ZYLOPRIM ) 100 MG tablet Take 1 tablet (100 mg total) by mouth daily. 30 tablet 2   amLODipine  (NORVASC ) 5 MG tablet Take 1.5 tablets (7.5 mg total) by mouth daily. 45 tablet 1   apixaban  (ELIQUIS ) 5 MG TABS tablet Take 1 tablet by mouth twice daily 180 tablet 1   carvedilol  (COREG ) 12.5 MG tablet TAKE 1 TABLET BY MOUTH TWICE DAILY WITH A MEAL 180 tablet 0   COMIRNATY syringe      cyanocobalamin (VITAMIN B12) 1000 MCG tablet Take 1,000 mcg by mouth daily.     dapagliflozin propanediol (FARXIGA) 10 MG TABS tablet Take 10 mg by mouth in the morning.     ezetimibe  (ZETIA ) 10 MG tablet Take 1 tablet (10 mg total) by mouth daily. 90 tablet 3   FLUAD 0.5 ML injection      furosemide  (LASIX ) 20 MG tablet Take 20 mg by mouth daily as needed for edema.     isosorbide  dinitrate (ISORDIL ) 5 MG tablet Take 1 tablet  by mouth twice daily 180 tablet 3   loratadine  (CLARITIN ) 10 MG tablet Take 1 tablet (10 mg total) by mouth daily. 30 tablet 0   Multiple Vitamins-Minerals (OCUVITE ADULT 50+ PO) Take 1 tablet by mouth daily.     olmesartan  (BENICAR ) 40 MG tablet Take 1 tablet (40 mg total) by mouth daily. 90 tablet 3   Probiotic Product (PROBIOTIC PO) Take 1 capsule by mouth 2 (two) times a week.     rosuvastatin  (CRESTOR ) 40 MG tablet Take 1 tablet by mouth once daily 90 tablet 3   Semaglutide ,0.25 or 0.5MG /DOS, (OZEMPIC , 0.25 OR 0.5 MG/DOSE,) 2 MG/3ML SOPN Inject 0.5 mg into the skin once a week. (Patient not taking: Reported on 04/28/2024) 9 mL 3   SYNTHROID  100 MCG tablet Take 1 tablet (100 mcg total) by mouth daily before breakfast. 90 tablet 3   No facility-administered medications prior to visit.    Allergies  Allergen Reactions   Labetalol     headaches   Codeine Anxiety    Review of Systems  Constitutional:  Positive for malaise/fatigue. Negative for chills, fever and weight loss.  HENT:  Negative for congestion and sore throat.   Eyes:  Negative for blurred vision, double vision and photophobia.  Respiratory:  Positive for shortness of breath. Negative for cough, sputum production and wheezing.   Cardiovascular:  Positive for palpitations. Negative for chest pain, orthopnea, claudication and leg swelling.  Gastrointestinal:  Negative for abdominal pain, constipation, diarrhea, nausea and vomiting.  Genitourinary:  Negative for dysuria, frequency and urgency.  Musculoskeletal:  Negative for falls.  Neurological:  Negative for dizziness, tingling, focal weakness and headaches.       Objective:     Physical Exam Constitutional:      General: She is not in acute distress.    Appearance: She is not ill-appearing.  Eyes:     Extraocular Movements: Extraocular movements intact.     Conjunctiva/sclera: Conjunctivae normal.  Cardiovascular:     Rate and Rhythm: Normal rate. Rhythm  irregular.  Pulmonary:     Effort: Pulmonary effort is normal.     Breath sounds: Normal breath sounds.  Musculoskeletal:     Cervical back: Normal range of motion and neck supple.     Right lower leg: No tenderness. No edema.     Left lower leg: No tenderness. No edema.  Skin:    General: Skin is warm and dry.  Neurological:     General: No focal deficit present.     Mental Status: She is alert and oriented to person, place, and time.  Psychiatric:        Mood and Affect: Mood normal.        Behavior: Behavior normal.        Thought Content: Thought content normal.      BP 128/84 (BP Location: Left Arm, Patient Position: Sitting)   Pulse 73   Temp 97.9 F (36.6 C) (Temporal)   Ht 5\' 5"  (1.651 m)   Wt 201 lb (91.2 kg)   SpO2 99%   BMI 33.45 kg/m  Wt Readings from Last 3 Encounters:  05/01/24 201 lb (91.2 kg)  04/28/24 201 lb 9.6 oz (91.4 kg)  04/26/24 199 lb 15.3 oz (90.7 kg)       Assessment & Plan:   Problem List Items Addressed This Visit     Dyslipidemia, goal LDL below 70   Essential hypertension   Relevant Orders   Urinalysis, Routine w reflex microscopic (Completed)   Hypercoagulable state due to persistent atrial fibrillation (HCC)   OSA on CPAP   Persistent atrial fibrillation (HCC)   Prediabetes   Relevant Orders   Hemoglobin A1c (Completed)   Urinalysis, Routine w reflex microscopic (Completed)   SOB (shortness of breath) on exertion - Primary   Relevant Orders   Basic metabolic panel with GFR (Completed)   Magnesium  (Completed)   D-dimer, quantitative (Completed)   Other Visit Diagnoses       Palpitations       Relevant Orders   Basic metabolic panel with GFR (Completed)   Magnesium  (Completed)     Fatigue, unspecified type       Relevant Orders   Basic metabolic panel with GFR (Completed)   Hemoglobin A1c (Completed)   Magnesium  (Completed)   CBC (Completed)      Reviewed notes from ED and cardiology as well as results.  No change  today per patient.  Closely followed by cardiology and upcoming hospitalization for Tikosyn.  Recheck potassium today, it was low.  Advised her to rest, avoid overexertion, use CPAP nightly even if traveling and go to the ED for any worsening symptoms.   I am having Claudia Cantrell maintain her dapagliflozin propanediol, Probiotic Product (PROBIOTIC PO), furosemide , acetaminophen , allopurinol , cyanocobalamin, Multiple Vitamins-Minerals (OCUVITE ADULT 50+ PO), rosuvastatin , isosorbide  dinitrate, Eliquis , carvedilol , amLODipine , Comirnaty, Fluad, Ozempic  (0.25 or 0.5 MG/DOSE), Synthroid , loratadine , olmesartan , and ezetimibe .  No orders of the defined types were placed in this encounter.

## 2024-05-01 NOTE — Patient Instructions (Addendum)
 Please go downstairs for labs and a urine test.    If you feel any worse or have chest pain please call 911.

## 2024-05-07 NOTE — Progress Notes (Deleted)
 Office Visit Note  Patient: Claudia Cantrell             Date of Birth: February 27, 1946           MRN: [id number]             PCP: Abram Abraham, NP-C Referring: Abram Abraham, NP-C Visit Date: 05/21/2024 Occupation: @GUAROCC @  Subjective:  No chief complaint on file.   History of Present Illness: Claudia Cantrell is a 79 y.o. female ***     Activities of Daily Living:  Patient reports morning stiffness for *** {minute/hour:19697}.   Patient {ACTIONS;DENIES/REPORTS:21021675::"Denies"} nocturnal pain.  Difficulty dressing/grooming: {ACTIONS;DENIES/REPORTS:21021675::"Denies"} Difficulty climbing stairs: {ACTIONS;DENIES/REPORTS:21021675::"Denies"} Difficulty getting out of chair: {ACTIONS;DENIES/REPORTS:21021675::"Denies"} Difficulty using hands for taps, buttons, cutlery, and/or writing: {ACTIONS;DENIES/REPORTS:21021675::"Denies"}  No Rheumatology ROS completed.   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/2022   Acquired hypothyroidism 10/30/2021   Stromal tumor determined by gastric biopsy 06/24/2020   Iatrogenic hyperthyroidism 05/02/2020   Dyslipidemia, goal LDL below 70 05/02/2020   Acute combined systolic and diastolic CHF, NYHA class 3 (HCC) 09/23/2019   Persistent atrial fibrillation (HCC) 09/21/2019   Submucosal lesion of stomach 07/19/2019   Abnormal CT of the abdomen 07/19/2019   Abnormal CT scan, stomach 06/17/2019    Advance directive declined by patient 01/14/2019   Chronic anticoagulation 01/14/2019   CRI (chronic renal insufficiency), stage 2 (mild) 01/14/2019   Prediabetes 01/14/2019   Persistent proteinuria 02/17/2018   Hyperuricemia 03/26/2017   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   Vitamin D  deficiency 03/26/2017   Medication monitoring encounter 03/26/2017   Gout 10/08/2016   Essential hypertension    CAD S/P percutaneous coronary angioplasty    Nonischemic cardiomyopathy (HCC)    Chest pain with moderate risk for cardiac etiology 08/02/2016   Closed right ankle fracture 04/24/2015   Ankle fracture, bimalleolar, closed 04/24/2015   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   OSA on CPAP 10/09/2013    Past Medical History:  Diagnosis Date   Arthritis    rheumatoid   Back pain    CAD (coronary artery disease)    a. 1992 s/p MI and PTCA of unknown vessel;  b. 09/2010 Cath: LM nl, LAD 20p, LCX 43m, RCA 68m, RPL 20.   Cancer [facility])    Cervical cancer (HCC) 1977   Chest pain    Chronic kidney disease    Constipation    Family history of anesthesia complication    daughter has difficulty waking    GERD (gastroesophageal reflux disease)    occ   Gout 10/08/2016   History of heart attack    Hyperlipidemia    Hypertensive heart disease    Hypothyroidism    Joint pain    Nonischemic cardiomyopathy (HCC)    a. 07/2016 Echo: EF 40-45%, mild LVH, inferior akinesis, moderately dilated left atrium, trivial AI and MR.   PAF (paroxysmal atrial fibrillation) (HCC)    a. 07/2016 Admitted w/ AF RVR-->CHA2DS2VASc = 5-->Eliquis ;  b.  07/2016 successful TEE/DCCV.   Pre-diabetes    Sleep apnea    a. Using CPAP.   SOB (shortness of breath)    Vitamin D  deficiency     Family History  Problem Relation Age of Onset   Heart disease Mother    Sudden death Mother    Diabetes Father    Stroke Father    Bone cancer Sister    Sudden death Brother    Melanoma Brother    Heart disease  Brother    Colon cancer Neg Hx    Esophageal cancer Neg Hx    Inflammatory bowel disease Neg Hx    Liver disease Neg Hx    Pancreatic cancer Neg Hx    Rectal cancer Neg Hx    Stomach cancer Neg Hx    Past Surgical History:  Procedure Laterality Date   ABDOMINAL HYSTERECTOMY  12/10/1986   ATRIAL FIBRILLATION ABLATION N/A 07/28/2021   Procedure: ATRIAL FIBRILLATION ABLATION;  Surgeon: Lei Pump, MD;  Location: [facility];  Service: Cardiovascular;  Laterality: N/A;   ATRIAL FIBRILLATION ABLATION N/A 12/14/2022   Procedure: ATRIAL FIBRILLATION ABLATION;  Surgeon: Lei Pump, MD;  Location: [facility];  Service: Cardiovascular;  Laterality: N/A;   BACK SURGERY  12/10/1992   BIOPSY  09/02/2019   Procedure: BIOPSY;  Surgeon: Brice Campi Albino Alu., MD;  Location: [facility];  Service: Gastroenterology;;   BIOPSY  12/09/2019   Procedure: BIOPSY;  Surgeon: Normie Becton., MD;  Location: [facility];  Service: Gastroenterology;;   BIOPSY  03/08/2021   Procedure: BIOPSY;  Surgeon: Normie Becton., MD;  Location: [facility];  Service: Gastroenterology;;   BIOPSY  07/08/2023   Procedure: BIOPSY;  Surgeon: Normie Becton., MD;  Location: [facility];  Service: Gastroenterology;;   CARDIAC CATHETERIZATION  1991 AND 1992   PTCA BY DR Mahlon Schwab   CARDIOVERSION N/A 08/01/2016   Procedure: CARDIOVERSION;  Surgeon: Lenise Quince, MD;  Location: [facility] ENDOSCOPY;  Service: Cardiovascular;  Laterality: N/A;   CARDIOVERSION N/A 01/11/2020   Procedure: CARDIOVERSION;  Surgeon: Loyde Rule, MD;  Location: [facility] ENDOSCOPY;  Service: Cardiovascular;  Laterality: N/A;   CARDIOVERSION N/A 07/08/2020   Procedure: CARDIOVERSION;  Surgeon: Wendie Hamburg, MD;  Location: [facility] ENDOSCOPY;  Service: Cardiovascular;  Laterality: N/A;   CARDIOVERSION N/A 05/31/2022   Procedure: CARDIOVERSION;  Surgeon: Hazle Lites, MD;  Location: [facility]  ENDOSCOPY;  Service: Cardiovascular;  Laterality: N/A;   CARDIOVERSION N/A 07/18/2022   Procedure: CARDIOVERSION;  Surgeon: Wendie Hamburg, MD;  Location: [facility] ENDOSCOPY;  Service: Cardiovascular;  Laterality: N/A;   CATARACT EXTRACTION  2024   June/July 2024 bilateral eyes   COLONOSCOPY  07/2023   ENDOSCOPIC MUCOSAL RESECTION N/A 12/09/2019   Procedure: ENDOSCOPIC MUCOSAL RESECTION;  Surgeon: Normie Becton., MD;  Location: [facility] ENDOSCOPY;  Service: Gastroenterology;  Laterality: N/A;   ESOPHAGOGASTRODUODENOSCOPY N/A 03/08/2021   Procedure: ESOPHAGOGASTRODUODENOSCOPY (EGD);  Surgeon: Normie Becton., MD;  Location: [facility];  Service: Gastroenterology;  Laterality: N/A;   ESOPHAGOGASTRODUODENOSCOPY (EGD) WITH PROPOFOL  N/A 09/02/2019   Procedure: ESOPHAGOGASTRODUODENOSCOPY (EGD) WITH PROPOFOL ;  Surgeon: Brice Campi Albino Alu., MD;  Location: [facility] ENDOSCOPY;  Service: Gastroenterology;  Laterality: N/A;   ESOPHAGOGASTRODUODENOSCOPY (EGD) WITH PROPOFOL  N/A 12/09/2019   Procedure: ESOPHAGOGASTRODUODENOSCOPY (EGD) WITH PROPOFOL ;  Surgeon: Brice Campi Albino Alu., MD;  Location: [facility] ENDOSCOPY;  Service: Gastroenterology;  Laterality: N/A;   ESOPHAGOGASTRODUODENOSCOPY (EGD) WITH PROPOFOL  N/A 07/08/2023   Procedure: ESOPHAGOGASTRODUODENOSCOPY (EGD) WITH PROPOFOL ;  Surgeon:  Mansouraty, Albino Alu., MD;  Location: [facility];  Service: Gastroenterology;  Laterality: N/A;   EUS N/A 09/02/2019   Procedure: UPPER ENDOSCOPIC ULTRASOUND (EUS) RADIAL;  Surgeon: Normie Becton., MD;  Location: [facility] ENDOSCOPY;  Service: Gastroenterology;  Laterality: N/A;  EUS radial/linear   EUS N/A 12/09/2019   Procedure: UPPER ENDOSCOPIC ULTRASOUND (EUS) RADIAL;  Surgeon: Normie Becton., MD;  Location: [facility] ENDOSCOPY;  Service: Gastroenterology;  Laterality: N/A;   EUS  12/09/2019   Procedure: UPPER ENDOSCOPIC ULTRASOUND (EUS) LINEAR;  Surgeon: Normie Becton., MD;  Location: [facility]  ENDOSCOPY;  Service: Gastroenterology;;   EUS N/A 03/08/2021   Procedure: UPPER ENDOSCOPIC ULTRASOUND (EUS) RADIAL;  Surgeon: Normie Becton., MD;  Location: [facility];  Service: Gastroenterology;  Laterality: N/A;   FINE NEEDLE ASPIRATION  09/02/2019   Procedure: FINE NEEDLE ASPIRATION (FNA) LINEAR;  Surgeon: Normie Becton., MD;  Location: [facility];  Service: Gastroenterology;;   FINE NEEDLE ASPIRATION  12/09/2019   Procedure: FINE NEEDLE ASPIRATION (FNA) LINEAR;  Surgeon: Normie Becton., MD;  Location: [facility];  Service: Gastroenterology;;   HEMOSTASIS CLIP PLACEMENT  12/09/2019   Procedure: HEMOSTASIS CLIP PLACEMENT;  Surgeon: Normie Becton., MD;  Location: [facility];  Service: Gastroenterology;;   ORIF ANKLE FRACTURE Right 04/27/2015   Procedure: OPEN REDUCTION INTERNAL FIXATION (ORIF) RIGHT BIMALLEOLAR ANKLE FRACTURE WITH SYNDESMOSIS FIXATION;  Surgeon: Wes Hamman, MD;  Location: [facility] OR;  Service: Orthopedics;  Laterality: Right;   SUBMUCOSAL LIFTING INJECTION  12/09/2019   Procedure: SUBMUCOSAL LIFTING INJECTION;  Surgeon: Normie Becton., MD;  Location: [facility] ENDOSCOPY;  Service: Gastroenterology;;   TEE WITHOUT CARDIOVERSION N/A 08/01/2016   Procedure: TRANSESOPHAGEAL ECHOCARDIOGRAM (TEE);  Surgeon: Lenise Quince, MD;  Location: [facility] ENDOSCOPY;  Service: Cardiovascular;  Laterality: N/A;   THYROIDECTOMY  11/24/2013   DR Lauralee Poll   THYROIDECTOMY N/A 11/24/2013   Procedure: TOTAL THYROIDECTOMY;  Surgeon: Levert Ready, MD;  Location: [facility] OR;  Service: General;  Laterality: N/A;   TRANSTHORACIC ECHOCARDIOGRAM  01/15/2013   EF 55% TO 65%. PROBABLE MILD HYPOKINESIS OF THE INFERIOR MYOCARDIUM. GRADE 1 DIASTOLIC DYSFUNCTION. TRIAL AR.LA IS MILDLY DILATED.   UPPER ESOPHAGEAL ENDOSCOPIC ULTRASOUND (EUS) N/A 07/08/2023   Procedure: UPPER ESOPHAGEAL ENDOSCOPIC ULTRASOUND (EUS);  Surgeon: Normie Becton., MD;  Location: [facility];   Service: Gastroenterology;  Laterality: N/A;   Social History   Social History Narrative   Right handed    Living  alone.   Immunization History  Administered Date(s) Administered   Fluad Quad(high Dose 65+) 08/22/2019, 09/20/2021   Influenza Nasal 11/25/2013   Influenza, High Dose Seasonal PF 12/30/2014, 10/14/2017, 11/01/2018   Influenza,inj,Quad PF,6+ Mos 11/25/2013   Influenza-Unspecified 12/30/2012, 11/09/2015, 12/24/2016, 09/16/2020   PFIZER(Purple Top)SARS-COV-2 Vaccination 12/29/2019, 01/18/2020, 09/16/2020   PNEUMOCOCCAL CONJUGATE-20 12/26/2023   Pfizer Covid-19 Vaccine Bivalent Booster 5y-11y 10/21/2021   Pneumococcal Conjugate-13 12/30/2014, 10/14/2017   Pneumococcal Polysaccharide-23 11/25/2013, 08/22/2019   Zoster Recombinant(Shingrix) 10/14/2017, 01/21/2018   Zoster, Live 12/15/2008     Objective: Vital Signs: There were no vitals taken for this visit.   Physical Exam   Musculoskeletal Exam: ***  CDAI Exam: CDAI Score: -- Patient Global: --; Provider Global: -- Swollen: --; Tender: -- Joint Exam 05/21/2024   No joint exam has been documented for this visit   There is currently no information documented on the homunculus. Go to the Rheumatology activity and complete the homunculus joint exam.  Investigation: No additional findings.  Imaging: DG Chest 2 View Result Date:  04/26/2024 CLINICAL DATA:  chest pain EXAM: CHEST - 2 VIEW COMPARISON:  September 06, 2023 FINDINGS: The cardiomediastinal silhouette is unchanged and enlarged in contour. No pleural effusion. No pneumothorax. No acute pleuroparenchymal abnormality. Visualized abdomen is unremarkable. Multilevel degenerative changes of the thoracic spine. IMPRESSION: No acute cardiopulmonary abnormality. Electronically Signed   By: Clancy Crimes M.D.   On: 04/26/2024 19:57    Recent Labs: Lab Results  Component Value Date   WBC 3.3 (L) 05/01/2024   HGB 15.5 (H) 05/01/2024   PLT 197.0 05/01/2024    NA 143 05/01/2024   K 4.1 05/01/2024   CL 106 05/01/2024   CO2 30 05/01/2024   GLUCOSE 90 05/01/2024   BUN 10 05/01/2024   CREATININE 0.94 05/01/2024   BILITOT 0.7 04/02/2024   ALKPHOS 114 04/02/2024   AST 17 04/02/2024   ALT 16 04/02/2024   PROT 7.3 04/02/2024   ALBUMIN 4.5 04/02/2024   CALCIUM  10.1 05/01/2024   GFRAA 74 09/16/2020    Speciality Comments: No specialty comments available.  Procedures:  No procedures performed Allergies: Labetalol and Codeine   Assessment / Plan:     Visit Diagnoses: No diagnosis found.  Orders: No orders of the defined types were placed in this encounter.  No orders of the defined types were placed in this encounter.   Face-to-face time spent with patient was *** minutes. Greater than 50% of time was spent in counseling and coordination of care.  Follow-Up Instructions: No follow-ups on file.   Dee Farber, CMA  Note - This record has been created using Animal nutritionist.  Chart creation errors have been sought, but may not always  have been located. Such creation errors do not reflect on  the standard of medical care.

## 2024-05-15 ENCOUNTER — Encounter (HOSPITAL_COMMUNITY): Payer: Self-pay

## 2024-05-16 ENCOUNTER — Other Ambulatory Visit: Payer: Self-pay | Admitting: Cardiology

## 2024-05-18 ENCOUNTER — Other Ambulatory Visit: Payer: Self-pay

## 2024-05-18 ENCOUNTER — Inpatient Hospital Stay (HOSPITAL_COMMUNITY)
Admission: AD | Admit: 2024-05-18 | Discharge: 2024-05-21 | DRG: 309 | Disposition: A | Discharge status: Home / Self Care | Attending: Cardiology | Admitting: Cardiology

## 2024-05-18 ENCOUNTER — Encounter (HOSPITAL_COMMUNITY): Payer: Self-pay | Admitting: Physician Assistant

## 2024-05-18 ENCOUNTER — Ambulatory Visit (HOSPITAL_COMMUNITY)
Admission: RE | Admit: 2024-05-18 | Discharge: 2024-05-18 | Disposition: A | Discharge status: Home / Self Care | Source: Ambulatory Visit | Attending: Physician Assistant | Admitting: Physician Assistant

## 2024-05-18 ENCOUNTER — Inpatient Hospital Stay (HOSPITAL_COMMUNITY)

## 2024-05-18 ENCOUNTER — Encounter (HOSPITAL_COMMUNITY): Payer: Self-pay | Admitting: Cardiology

## 2024-05-18 VITALS — BP 142/100 | HR 87 | Ht 65.0 in | Wt 203.8 lb

## 2024-05-18 DIAGNOSIS — Z8541 Personal history of malignant neoplasm of cervix uteri: Secondary | ICD-10-CM | POA: Diagnosis not present

## 2024-05-18 DIAGNOSIS — Z5181 Encounter for therapeutic drug level monitoring: Secondary | ICD-10-CM | POA: Diagnosis not present

## 2024-05-18 DIAGNOSIS — Z885 Allergy status to narcotic agent status: Secondary | ICD-10-CM | POA: Diagnosis not present

## 2024-05-18 DIAGNOSIS — D6869 Other thrombophilia: Secondary | ICD-10-CM | POA: Diagnosis present

## 2024-05-18 DIAGNOSIS — Z79899 Other long term (current) drug therapy: Secondary | ICD-10-CM | POA: Insufficient documentation

## 2024-05-18 DIAGNOSIS — I509 Heart failure, unspecified: Secondary | ICD-10-CM | POA: Diagnosis present

## 2024-05-18 DIAGNOSIS — I493 Ventricular premature depolarization: Secondary | ICD-10-CM | POA: Diagnosis present

## 2024-05-18 DIAGNOSIS — K0889 Other specified disorders of teeth and supporting structures: Secondary | ICD-10-CM | POA: Diagnosis present

## 2024-05-18 DIAGNOSIS — I2510 Atherosclerotic heart disease of native coronary artery without angina pectoris: Secondary | ICD-10-CM | POA: Insufficient documentation

## 2024-05-18 DIAGNOSIS — I252 Old myocardial infarction: Secondary | ICD-10-CM

## 2024-05-18 DIAGNOSIS — Z7901 Long term (current) use of anticoagulants: Secondary | ICD-10-CM

## 2024-05-18 DIAGNOSIS — I130 Hypertensive heart and chronic kidney disease with heart failure and stage 1 through stage 4 chronic kidney disease, or unspecified chronic kidney disease: Secondary | ICD-10-CM | POA: Diagnosis present

## 2024-05-18 DIAGNOSIS — G4733 Obstructive sleep apnea (adult) (pediatric): Secondary | ICD-10-CM | POA: Diagnosis present

## 2024-05-18 DIAGNOSIS — I4819 Other persistent atrial fibrillation: Secondary | ICD-10-CM | POA: Insufficient documentation

## 2024-05-18 DIAGNOSIS — E785 Hyperlipidemia, unspecified: Secondary | ICD-10-CM | POA: Diagnosis present

## 2024-05-18 DIAGNOSIS — Z79890 Hormone replacement therapy (postmenopausal): Secondary | ICD-10-CM | POA: Diagnosis not present

## 2024-05-18 DIAGNOSIS — E1122 Type 2 diabetes mellitus with diabetic chronic kidney disease: Secondary | ICD-10-CM | POA: Diagnosis present

## 2024-05-18 DIAGNOSIS — I428 Other cardiomyopathies: Secondary | ICD-10-CM | POA: Diagnosis present

## 2024-05-18 DIAGNOSIS — N189 Chronic kidney disease, unspecified: Secondary | ICD-10-CM | POA: Diagnosis present

## 2024-05-18 DIAGNOSIS — I10 Essential (primary) hypertension: Secondary | ICD-10-CM | POA: Insufficient documentation

## 2024-05-18 DIAGNOSIS — E039 Hypothyroidism, unspecified: Secondary | ICD-10-CM | POA: Diagnosis present

## 2024-05-18 DIAGNOSIS — Z7984 Long term (current) use of oral hypoglycemic drugs: Secondary | ICD-10-CM | POA: Diagnosis not present

## 2024-05-18 DIAGNOSIS — Z888 Allergy status to other drugs, medicaments and biological substances status: Secondary | ICD-10-CM

## 2024-05-18 LAB — BASIC METABOLIC PANEL WITH GFR
BUN/Creatinine Ratio: 17 (ref 12–28)
BUN: 16 mg/dL (ref 8–27)
CO2: 25 mmol/L (ref 20–29)
Calcium: 9.3 mg/dL (ref 8.7–10.3)
Chloride: 105 mmol/L (ref 96–106)
Creatinine, Ser: 0.95 mg/dL (ref 0.57–1.00)
Glucose: 89 mg/dL (ref 70–99)
Potassium: 3.8 mmol/L (ref 3.5–5.2)
Sodium: 143 mmol/L (ref 134–144)
eGFR: 62 mL/min/{1.73_m2} (ref >59)

## 2024-05-18 LAB — MAGNESIUM: Magnesium: 2.3 mg/dL (ref 1.6–2.3)

## 2024-05-18 MED ORDER — DOFETILIDE 500 MCG PO CAPS
500.0000 ug | ORAL_CAPSULE | Freq: Two times a day (BID) | ORAL | Status: DC
  Administered 2024-05-18 – 2024-05-20 (×5): 500 ug via ORAL
  Filled 2024-05-18 (×5): qty 1

## 2024-05-18 MED ORDER — ALLOPURINOL 100 MG PO TABS
100.0000 mg | ORAL_TABLET | Freq: Every day | ORAL | Status: DC
  Administered 2024-05-19 – 2024-05-21 (×3): 100 mg via ORAL
  Filled 2024-05-18 (×3): qty 1

## 2024-05-18 MED ORDER — ACETAMINOPHEN 500 MG PO TABS
1000.0000 mg | ORAL_TABLET | Freq: Four times a day (QID) | ORAL | Status: DC | PRN
  Administered 2024-05-18 – 2024-05-21 (×6): 1000 mg via ORAL
  Filled 2024-05-18 (×6): qty 2

## 2024-05-18 MED ORDER — ROSUVASTATIN CALCIUM 20 MG PO TABS
40.0000 mg | ORAL_TABLET | Freq: Every day | ORAL | Status: DC
  Administered 2024-05-19 – 2024-05-21 (×3): 40 mg via ORAL
  Filled 2024-05-18 (×3): qty 2

## 2024-05-18 MED ORDER — CARVEDILOL 12.5 MG PO TABS
12.5000 mg | ORAL_TABLET | Freq: Two times a day (BID) | ORAL | Status: DC
  Administered 2024-05-18 – 2024-05-19 (×2): 12.5 mg via ORAL
  Filled 2024-05-18 (×2): qty 1

## 2024-05-18 MED ORDER — TRAMADOL HCL 50 MG PO TABS
50.0000 mg | ORAL_TABLET | Freq: Four times a day (QID) | ORAL | Status: DC | PRN
  Administered 2024-05-18 – 2024-05-19 (×2): 50 mg via ORAL
  Filled 2024-05-18 (×2): qty 1

## 2024-05-18 MED ORDER — SODIUM CHLORIDE 0.9 % IV SOLN: 250.0000 mL | INTRAVENOUS | Status: AC | PRN

## 2024-05-18 MED ORDER — AMLODIPINE BESYLATE 5 MG PO TABS
7.5000 mg | ORAL_TABLET | Freq: Every day | ORAL | Status: DC
  Administered 2024-05-19 – 2024-05-21 (×3): 7.5 mg via ORAL
  Filled 2024-05-18 (×3): qty 1

## 2024-05-18 MED ORDER — SODIUM CHLORIDE 0.9% FLUSH: 3.0000 mL | INTRAVENOUS | Status: DC | PRN

## 2024-05-18 MED ORDER — VITAMIN B-12 1000 MCG PO TABS
1000.0000 ug | ORAL_TABLET | Freq: Every day | ORAL | Status: DC
  Administered 2024-05-19 – 2024-05-21 (×3): 1000 ug via ORAL
  Filled 2024-05-18 (×3): qty 1

## 2024-05-18 MED ORDER — LORATADINE 10 MG PO TABS
10.0000 mg | ORAL_TABLET | Freq: Every day | ORAL | Status: DC
  Administered 2024-05-19 – 2024-05-21 (×3): 10 mg via ORAL
  Filled 2024-05-18 (×3): qty 1

## 2024-05-18 MED ORDER — EZETIMIBE 10 MG PO TABS
10.0000 mg | ORAL_TABLET | Freq: Every day | ORAL | Status: DC
  Administered 2024-05-19 – 2024-05-21 (×3): 10 mg via ORAL
  Filled 2024-05-18 (×3): qty 1

## 2024-05-18 MED ORDER — FUROSEMIDE 20 MG PO TABS: 20.0000 mg | ORAL_TABLET | Freq: Every day | ORAL | Status: DC | PRN

## 2024-05-18 MED ORDER — SODIUM CHLORIDE 0.9% FLUSH
3.0000 mL | Freq: Two times a day (BID) | INTRAVENOUS | Status: DC
  Administered 2024-05-18 – 2024-05-20 (×6): 3 mL via INTRAVENOUS

## 2024-05-18 MED ORDER — POTASSIUM CHLORIDE CRYS ER 20 MEQ PO TBCR
40.0000 meq | EXTENDED_RELEASE_TABLET | ORAL | Status: AC
  Administered 2024-05-18: 40 meq via ORAL
  Filled 2024-05-18: qty 2

## 2024-05-18 MED ORDER — LEVOTHYROXINE SODIUM 100 MCG PO TABS
100.0000 ug | ORAL_TABLET | Freq: Every day | ORAL | Status: DC
  Administered 2024-05-19 – 2024-05-21 (×3): 100 ug via ORAL
  Filled 2024-05-18 (×3): qty 1

## 2024-05-18 MED ORDER — APIXABAN 5 MG PO TABS
5.0000 mg | ORAL_TABLET | Freq: Two times a day (BID) | ORAL | Status: DC
  Administered 2024-05-18 – 2024-05-21 (×6): 5 mg via ORAL
  Filled 2024-05-18 (×6): qty 1

## 2024-05-18 MED ORDER — IRBESARTAN 150 MG PO TABS
300.0000 mg | ORAL_TABLET | Freq: Every day | ORAL | Status: DC
  Administered 2024-05-19 – 2024-05-21 (×3): 300 mg via ORAL
  Filled 2024-05-18 (×3): qty 2

## 2024-05-18 MED ORDER — ISOSORBIDE DINITRATE 5 MG PO TABS
5.0000 mg | ORAL_TABLET | Freq: Two times a day (BID) | ORAL | Status: DC
  Administered 2024-05-18 – 2024-05-20 (×5): 5 mg via ORAL
  Filled 2024-05-18 (×8): qty 1

## 2024-05-18 NOTE — Progress Notes (Addendum)
 Primary Care Physician: Abram Abraham, NP-C Referring Physician: Dr. Jaquita Merl is a 78 y.o. female with a h/o atrial fibrillation, CAD, OSA, HTN who presents for follow up in the [facility]. Patient is s/p afib ablation with Dr Lawana Pray on 07/28/21. She had done well with no know episodes of afib until she presented to the ED on 03/24/22 with chest pain. She tested positive for COVID at that time. ECG showed rate controlled afib. Seen by Dr Loetta Ringer on 05/15/22 and she was still in afib, her carvedilol  was increased.   Patient is s/p DCCV 05/31/22 which was unsuccessful after 4 shocks. She has symptoms of fatigue and exercise intolerance when in afib. She was started on amiodarone  as a bridge to possible repeat ablation. S/p DCCV on 07/18/22.  Patient is s/p afib ablation with Dr Lawana Pray on 12/14/22. She had recurrence of her afib and was seen by Dr Lawana Pray on 04/07/24 who recommended dofetilide.   Patient returns for follow up for atrial fibrillation and dofetilide loading. She remains in rate controlled afib with symptoms of fatigue with exertion. She denies any missed doses of anticoagulation in the past 3 weeks. No bleeding issues.   Today, she  denies symptoms of palpitations, chest pain, shortness of breath, orthopnea, PND, lower extremity edema, dizziness, presyncope, syncope, bleeding, or neurologic sequela. The patient is tolerating medications without difficulties and is otherwise without complaint today.    Past Medical History:  Diagnosis Date   Arthritis    rheumatoid   Back pain    CAD (coronary artery disease)    a. 1992 s/p MI and PTCA of unknown vessel;  b. 09/2010 Cath: LM nl, LAD 20p, LCX 63m, RCA 42m, RPL 20.   Cancer [facility])    Cervical cancer (HCC) 1977   Chest pain    Chronic kidney disease    Constipation    Family history of anesthesia complication    daughter has difficulty waking    GERD (gastroesophageal reflux disease)    occ    Gout 10/08/2016   History of heart attack    Hyperlipidemia    Hypertensive heart disease    Hypothyroidism    Joint pain    Nonischemic cardiomyopathy (HCC)    a. 07/2016 Echo: EF 40-45%, mild LVH, inferior akinesis, moderately dilated left atrium, trivial AI and MR.   PAF (paroxysmal atrial fibrillation) (HCC)    a. 07/2016 Admitted w/ AF RVR-->CHA2DS2VASc = 5-->Eliquis ;  b. 07/2016 successful TEE/DCCV.   Pre-diabetes    Sleep apnea    a. Using CPAP.   SOB (shortness of breath)    Vitamin D  deficiency     Current Outpatient Medications  Medication Sig Dispense Refill   acetaminophen  (TYLENOL ) 500 MG tablet Take 1,000 mg by mouth every 6 (six) hours as needed for moderate pain.     allopurinol  (ZYLOPRIM ) 100 MG tablet Take 1 tablet (100 mg total) by mouth daily. 30 tablet 2   amLODipine  (NORVASC ) 5 MG tablet Take 1.5 tablets (7.5 mg total) by mouth daily. 45 tablet 1   apixaban  (ELIQUIS ) 5 MG TABS tablet Take 1 tablet by mouth twice daily 180 tablet 1   carvedilol  (COREG ) 12.5 MG tablet TAKE 1 TABLET BY MOUTH TWICE DAILY WITH A MEAL 180 tablet 0   COMIRNATY syringe      cyanocobalamin (VITAMIN B12) 1000 MCG tablet Take 1,000 mcg by mouth daily.     ezetimibe  (ZETIA ) 10 MG tablet Take  1 tablet (10 mg total) by mouth daily. 90 tablet 3   FLUAD 0.5 ML injection      furosemide  (LASIX ) 20 MG tablet Take 20 mg by mouth daily as needed for edema.     isosorbide  dinitrate (ISORDIL ) 5 MG tablet Take 1 tablet by mouth twice daily 180 tablet 3   loratadine  (CLARITIN ) 10 MG tablet Take 1 tablet (10 mg total) by mouth daily. 30 tablet 0   Multiple Vitamins-Minerals (OCUVITE ADULT 50+ PO) Take 1 tablet by mouth daily.     olmesartan  (BENICAR ) 40 MG tablet Take 1 tablet (40 mg total) by mouth daily. 90 tablet 3   Probiotic Product (PROBIOTIC PO) Take 1 capsule by mouth 2 (two) times a week.     rosuvastatin  (CRESTOR ) 40 MG tablet Take 1 tablet by mouth once daily 90 tablet 3   SYNTHROID  100 MCG  tablet Take 1 tablet (100 mcg total) by mouth daily before breakfast. 90 tablet 3   dapagliflozin propanediol (FARXIGA) 10 MG TABS tablet Take 10 mg by mouth in the morning. (Patient not taking: Reported on 05/18/2024)     No current facility-administered medications for this encounter.    ROS- All systems are reviewed and negative except as per the HPI above  Physical Exam: Vitals:   05/18/24 1010  BP: (!) 142/100  Pulse: 87  Weight: 92.4 kg  Height: 5\' 5"  (1.651 m)     Wt Readings from Last 3 Encounters:  05/18/24 92.4 kg  05/01/24 91.2 kg  04/28/24 91.4 kg     GEN: Well nourished, well developed in no acute distress CARDIAC: Irregularly irregular rate and rhythm, no murmurs, rubs, gallops RESPIRATORY:  Clear to auscultation without rales, wheezing or rhonchi  ABDOMEN: Soft, non-tender, non-distended EXTREMITIES:  No edema; No deformity    EKG today demonstrates Afib, PVC Vent. rate 87 BPM PR interval * ms QRS duration 84 ms QT/QTcB 366/440 ms   Epic records reviewed    CHA2DS2-VASc Score = 6  The patient's score is based upon: CHF History: 1 HTN History: 1 Diabetes History: 1 Stroke History: 0 Vascular Disease History: 1 Age Score: 1 Gender Score: 1       ASSESSMENT AND PLAN: Persistent Atrial Fibrillation (ICD10:  I48.19) The patient's CHA2DS2-VASc score is 6, indicating a 9.7% annual risk of stroke.   S/p afib ablation 07/28/21 with repeat ablation 12/14/22 Patient presents for dofetilide admission Continue Eliquis  5 mg BID, states no missed doses in the last 3 weeks. No recent benadryl  use PharmD has screened medications for QT prolonging agents. She has held her Elvina Hammers in case DCCV is needed.  QTc in SR 409 ms Continue carvedilol  12.5 mg BID  Secondary Hypercoagulable State (ICD10:  D68.69) The patient is at significant risk for stroke/thromboembolism based upon her CHA2DS2-VASc Score of 6.  Continue Apixaban  (Eliquis ). No bleeding issues.    HTN Elevated today, will reassess in SR.  CAD No anginal symptoms  OSA  Encouraged nightly CPAP Patient will bring her mask from home.    To be admitted later today once a bed becomes available.    Myrtha Ates PA-C Afib Clinic [facility] [address] [place], [place] [address] [phone]

## 2024-05-18 NOTE — Progress Notes (Signed)
   05/18/24 2200  BiPAP/CPAP/SIPAP  $ Non-Invasive Home Ventilator  Initial  BiPAP/CPAP/SIPAP Pt Type Adult  BiPAP/CPAP/SIPAP Resmed  Mask Type Nasal pillows  Dentures removed? Not applicable  Mask Size Medium  Respiratory Rate 16 breaths/min  PEEP 9 cmH20  FiO2 (%) 21 %  Patient Home Machine No  Patient Home Mask (S)  Yes  Patient Home Tubing No  Auto Titrate No  CPAP/SIPAP surface wiped down Yes  Device Plugged into RED Power Outlet Yes  BiPAP/CPAP /SiPAP Vitals  Pulse Rate 96  Resp 16  SpO2 96 %  Bilateral Breath Sounds Clear;Diminished  MEWS Score/Color  MEWS Score 0  MEWS Score Color Marrie Sizer

## 2024-05-18 NOTE — H&P (Signed)
 Primary Care Physician: Abram Abraham, NP-C Referring Physician: Dr. Jaquita Merl is a 78 y.o. female with a h/o atrial fibrillation, CAD, OSA, HTN who presents for follow up in the [facility]. Patient is s/p afib ablation with Dr Lawana Pray on 07/28/21. She had done well with no know episodes of afib until she presented to the ED on 03/24/22 with chest pain. She tested positive for COVID at that time. ECG showed rate controlled afib. Seen by Dr Loetta Ringer on 05/15/22 and she was still in afib, her carvedilol  was increased.   Patient is s/p DCCV 05/31/22 which was unsuccessful after 4 shocks. She has symptoms of fatigue and exercise intolerance when in afib. She was started on amiodarone  as a bridge to possible repeat ablation. S/p DCCV on 07/18/22.  Patient is s/p afib ablation with Dr Lawana Pray on 12/14/22. She had recurrence of her afib and was seen by Dr Lawana Pray on 04/07/24 who recommended dofetilide.   Patient returns for follow up for atrial fibrillation and dofetilide loading. She remains in rate controlled afib with symptoms of fatigue with exertion. She denies any missed doses of anticoagulation in the past 3 weeks. No bleeding issues.   Today, she  denies symptoms of palpitations, chest pain, shortness of breath, orthopnea, PND, lower extremity edema, dizziness, presyncope, syncope, bleeding, or neurologic sequela. The patient is tolerating medications without difficulties and is otherwise without complaint today.    Past Medical History:  Diagnosis Date   Arthritis    rheumatoid   Back pain    CAD (coronary artery disease)    a. 1992 s/p MI and PTCA of unknown vessel;  b. 09/2010 Cath: LM nl, LAD 20p, LCX 4m, RCA 14m, RPL 20.   [facility])    Cervical cancer (HCC) 1977   Chest pain    Chronic kidney disease    Constipation    Family history of anesthesia complication    daughter has difficulty waking    GERD (gastroesophageal reflux disease)    occ    Gout 10/08/2016   History of heart attack    Hyperlipidemia    Hypertensive heart disease    Hypothyroidism    Joint pain    Nonischemic cardiomyopathy (HCC)    a. 07/2016 Echo: EF 40-45%, mild LVH, inferior akinesis, moderately dilated left atrium, trivial AI and MR.   PAF (paroxysmal atrial fibrillation) (HCC)    a. 07/2016 Admitted w/ AF RVR-->CHA2DS2VASc = 5-->Eliquis ;  b. 07/2016 successful TEE/DCCV.   Pre-diabetes    Sleep apnea    a. Using CPAP.   SOB (shortness of breath)    Vitamin D  deficiency     Current Facility-Administered Medications  Medication Dose Route Frequency Provider Last Rate Last Admin   0.9 %  sodium chloride  infusion  250 mL Intravenous PRN Camnitz, Babetta Lesch, MD       acetaminophen  (TYLENOL ) tablet 1,000 mg  1,000 mg Oral Q6H PRN Fenton, Clint R, PA       [START ON 05/19/2024] allopurinol  (ZYLOPRIM ) tablet 100 mg  100 mg Oral Daily Fenton, Clint R, PA       [START ON 05/19/2024] amLODipine  (NORVASC ) tablet 7.5 mg  7.5 mg Oral Daily Fenton, Clint R, PA       apixaban  (ELIQUIS ) tablet 5 mg  5 mg Oral BID Camnitz, Will Gaylyn Keas, MD       carvedilol  (COREG ) tablet 12.5 mg  12.5 mg Oral BID WC Fenton, Clint R, PA       [  START ON 05/19/2024] cyanocobalamin (VITAMIN B12) tablet 1,000 mcg  1,000 mcg Oral Daily Fenton, Clint R, PA       dofetilide (TIKOSYN) capsule 500 mcg  500 mcg Oral BID Camnitz, Will Gaylyn Keas, MD       Cecily Cohen ON 05/19/2024] ezetimibe  (ZETIA ) tablet 10 mg  10 mg Oral Daily Fenton, Clint R, PA       [START ON 05/19/2024] irbesartan  (AVAPRO ) tablet 300 mg  300 mg Oral Daily Fenton, Clint R, PA       isosorbide  dinitrate (ISORDIL ) tablet 5 mg  5 mg Oral BID Fenton, Clint R, PA       [START ON 05/19/2024] levothyroxine  (SYNTHROID ) tablet 100 mcg  100 mcg Oral QAC breakfast Fenton, Clint R, PA       [START ON 05/19/2024] loratadine  (CLARITIN ) tablet 10 mg  10 mg Oral Daily Fenton, Clint R, PA       potassium chloride  SA (KLOR-CON  M) CR tablet 40 mEq  40 mEq  Oral STAT Carney, Skipper Dumas, RPH       [START ON 05/19/2024] rosuvastatin  (CRESTOR ) tablet 40 mg  40 mg Oral Daily Fenton, Clint R, PA       sodium chloride  flush (NS) 0.9 % injection 3 mL  3 mL Intravenous Q12H Camnitz, Will Gaylyn Keas, MD       sodium chloride  flush (NS) 0.9 % injection 3 mL  3 mL Intravenous PRN Camnitz, Will Gaylyn Keas, MD        ROS- All systems are reviewed and negative except as per the HPI above  Physical Exam: Vitals:   05/18/24 1211  BP: (!) 153/89  Pulse: 85  Resp: 17  Temp: 98.3 F (36.8 C)  TempSrc: Oral  SpO2: 96%     Wt Readings from Last 3 Encounters:  05/18/24 92.4 kg  05/01/24 91.2 kg  04/28/24 91.4 kg     GEN: Well nourished, well developed in no acute distress CARDIAC: Irregularly irregular rate and rhythm, no murmurs, rubs, gallops RESPIRATORY:  Clear to auscultation without rales, wheezing or rhonchi  ABDOMEN: Soft, non-tender, non-distended EXTREMITIES:  No edema; No deformity    EKG today demonstrates Afib, PVC Vent. rate 87 BPM PR interval * ms QRS duration 84 ms QT/QTcB 366/440 ms   Epic records reviewed    CHA2DS2-VASc Score = 6  The patient's score is based upon: CHF History: 1 HTN History: 1 Diabetes History: 1 Stroke History: 0 Vascular Disease History: 1 Age Score: 1 Gender Score: 1       ASSESSMENT AND PLAN: Persistent Atrial Fibrillation (ICD10:  I48.19) The patient's CHA2DS2-VASc score is 6, indicating a 9.7% annual risk of stroke.   S/p afib ablation 07/28/21 with repeat ablation 12/14/22 Patient presents for dofetilide admission Continue Eliquis  5 mg BID, states no missed doses in the last 3 weeks. No recent benadryl  use PharmD has screened medications for QT prolonging agents. She has held her Claudia Cantrell in case DCCV is needed.  QTc in SR 409 ms Continue carvedilol  12.5 mg BID  Secondary Hypercoagulable State (ICD10:  D68.69) The patient is at significant risk for stroke/thromboembolism based upon her  CHA2DS2-VASc Score of 6.  Continue Apixaban  (Eliquis ). No bleeding issues.   HTN Elevated today, will reassess in SR.  CAD No anginal symptoms  OSA  Encouraged nightly CPAP Patient will bring her mask from home.    Claudia Brummond, NP Electrophysiology 05/18/24 12:43 PM

## 2024-05-18 NOTE — Progress Notes (Signed)
 Pharmacy: Dofetilide (Tikosyn) - Initial Consult Assessment and Electrolyte Replacement  Pharmacy consulted to assist in monitoring and replacing electrolytes in this 78 y.o. female admitted on 05/18/2024 undergoing dofetilide initiation. First dofetilide dose: 500 mcg BID scheduled to start tonight.  Assessment:  Patient Exclusion Criteria: If any screening criteria checked as "Yes", then  patient  should NOT receive dofetilide until criteria item is corrected.  If "Yes" please indicate correction plan.  YES  NO Patient  Exclusion Criteria Correction Plan/Comments   []   [x]   Baseline QTc interval is greater than or equal to 440 msec. IF above YES box checked dofetilide contraindicated unless patient has ICD; then may proceed if QTc 500-550 msec or with known ventricular conduction abnormalities may proceed with QTc 550-600 msec. QTc = 440     []   [x]   Patient is known or suspected to have a digoxin level greater than 2 ng/ml: No results found for: "DIGOXIN"     []   [x]   Creatinine clearance less than 20 ml/min (calculated using Cockcroft-Gault, actual body weight and serum creatinine): Estimated Creatinine Clearance: 57.5 mL/min (by C-G formula based on SCr of 0.95 mg/dL).     []   [x]  Patient has received drugs known to prolong the QT intervals within the last 48 hour (examples: phenothiazines, tricyclics or tetracyclic antidepressants, macrolides, 1st generation H-1 antihistamines (especially diphenhydramine ), fluoroquinolones, azoles, ondansetron , metoclopramide , promethazine).   Updated information on QT prolonging agents is available to be searched on the following database:QT prolonging agents -If SSRI or antihistamine needed, preferred options are sertraline and loratadine  respectively     []   [x]  Patient received a dose of a thiazide diuretic in the last 48 hours [including hydrochlorothiazide  (Oretic ) alone or in any combination including triamterene (Dyazide, Maxzide)].     []   [x]  Patient received a medication known to increase dofetilide plasma concentrations prior to initial dofetilide dose:  Trimethoprim (Primsol, Proloprim) in the last 36 hours Verapamil (Calan, Verelan) in the last 36 hours or a sustained release dose in the last 72 hours Megestrol (Megace) in the last 5 days  Cimetidine (Tagamet) in the last 6 hours Ketoconazole (Nizoral) in the last 24 hours Itraconazole (Sporanox) in the last 48 hours  Prochlorperazine  (Compazine ) in the last 36 hours     []   [x]   Patient is known to have a history of torsades de pointes; congenital or acquired long QT syndromes.    []   [x]   Patient has received a Class 1 and Class 3 antiarrhythmic with less than 2 half-lives since last dose. (Disopyramide, Quinidine, Procainamide, Lidocaine , Mexiletine, Flecainide, Propafenone, Sotalol, Dronedarone)    []   [x]   Patient has received amiodarone  therapy in the past 3 months or amiodarone  level is greater than 0.3 ng/ml.    Labs:    Component Value Date/Time   K 3.8 05/18/2024 1020   MG 2.3 05/18/2024 1020     Plan: Select One Calculated CrCl  Dose q12h  [x]  > 60 ml/min 500 mcg  []  40-60 ml/min 250 mcg  []  20-40 ml/min 125 mcg   [x]   Physician selected initial dose within range recommended for patients level of renal function - will monitor for response.  []   Physician selected initial dose outside of range recommended for patients level of renal function - will discuss if the dose should be altered at this time.   Patient has been appropriately anticoagulated with Eliquis .  Potassium: K 3.8-3.9:  Hold Tikosyn initiation and give KCl 40 mEq po x1 then  begin Tikosyn at least 2hr after KCl dose - do not need to recheck K   Magnesium : Mg >2: Appropriate to initiate Tikosyn, no replacement needed     Thank you for allowing pharmacy to participate in this patient's care   Joanell Mowers, Davey Erp, [facility] Clinical Pharmacist  05/18/2024 12:42  PM   [facility] pharmacy phone numbers are listed on [email or web address]

## 2024-05-18 NOTE — Progress Notes (Signed)
   Patient reporting significant dental pain tonight. Will order Tramadol  50mg  Q6hr PRN for breakthrough pain. This should only be given if pain persists after administration of Tylenol . Will also order orthopantogram after discussion with Dr. Larance Plater.  Leala Prince, PA-C

## 2024-05-19 ENCOUNTER — Telehealth (HOSPITAL_COMMUNITY): Payer: Self-pay | Admitting: Pharmacy Technician

## 2024-05-19 ENCOUNTER — Other Ambulatory Visit (HOSPITAL_COMMUNITY): Payer: Self-pay

## 2024-05-19 DIAGNOSIS — I4819 Other persistent atrial fibrillation: Secondary | ICD-10-CM | POA: Diagnosis not present

## 2024-05-19 LAB — BASIC METABOLIC PANEL WITH GFR
Anion gap: 11 (ref 5–15)
BUN: 17 mg/dL (ref 8–23)
CO2: 20 mmol/L — ABNORMAL LOW (ref 22–32)
Calcium: 8.8 mg/dL — ABNORMAL LOW (ref 8.9–10.3)
Chloride: 107 mmol/L (ref 98–111)
Creatinine, Ser: 1.05 mg/dL — ABNORMAL HIGH (ref 0.44–1.00)
GFR, Estimated: 55 mL/min — ABNORMAL LOW (ref >60)
Glucose, Bld: 95 mg/dL (ref 70–99)
Potassium: 3.9 mmol/L (ref 3.5–5.1)
Sodium: 138 mmol/L (ref 135–145)

## 2024-05-19 LAB — MAGNESIUM: Magnesium: 2.2 mg/dL (ref 1.7–2.4)

## 2024-05-19 MED ORDER — POTASSIUM CHLORIDE CRYS ER 20 MEQ PO TBCR
40.0000 meq | EXTENDED_RELEASE_TABLET | Freq: Once | ORAL | Status: AC
  Administered 2024-05-19: 40 meq via ORAL
  Filled 2024-05-19: qty 2

## 2024-05-19 MED ORDER — OFF THE BEAT BOOK
Freq: Once | Status: AC
  Filled 2024-05-19: qty 1

## 2024-05-19 MED ORDER — CARVEDILOL 6.25 MG PO TABS
6.2500 mg | ORAL_TABLET | Freq: Two times a day (BID) | ORAL | Status: DC
  Administered 2024-05-19 – 2024-05-20 (×2): 6.25 mg via ORAL
  Filled 2024-05-19 (×2): qty 1

## 2024-05-19 NOTE — Progress Notes (Signed)
 Notified by CCMD pt converted to SB. EKG obtained to verify & placed in chart

## 2024-05-19 NOTE — Telephone Encounter (Signed)
 Patient Product/process development scientist completed.    The patient is insured through [facility]. Patient has Medicare and is not eligible for a copay card, but may be able to apply for patient assistance or Medicare RX Payment Plan (Patient Must reach out to their plan, if eligible for payment plan), if available.    Ran test claim for dofetilide (Tikosyn) 500 mcg and the current 30 day co-pay is $0.00.   This test claim was processed through [facility]- copay amounts may vary at other pharmacies due to pharmacy/plan contracts, or as the patient moves through the different stages of their insurance plan.     Morgan Arab, CPHT Pharmacy Technician III Certified Patient Advocate [facility] Pharm[facility] Patient Advocate Team Direct Number: [phone]  Fax: [phone]

## 2024-05-19 NOTE — Progress Notes (Signed)
 Pharmacy: Dofetilide (Tikosyn) - Follow Up Assessment and Electrolyte Replacement  Pharmacy consulted to assist in monitoring and replacing electrolytes in this 78 y.o. female admitted on 05/18/2024 undergoing dofetilide initiation. First dofetilide dose: 500 mcg BID; started 05/18/24  Labs:    Component Value Date/Time   K 3.9 05/19/2024 0359   MG 2.2 05/19/2024 0359     Plan: Potassium: K 3.8-3.9:  Give KCl 40 mEq po x1   Magnesium : Mg > 2: No additional supplementation needed  Thank you for allowing pharmacy to participate in this patient's care   Albino Alu 05/19/2024  7:27 AM

## 2024-05-19 NOTE — Progress Notes (Signed)
 Patient HR trending down to high 40s-mid 50s. Notified Adaline Holly NP and requesting clarification about whether to hold or give evening carvedilol ; dose was reduced by NP and RN was instructed to give reduced dose.

## 2024-05-19 NOTE — Progress Notes (Addendum)
 Electrophysiology Rounding Note  Patient Name: Claudia Cantrell Date of Encounter: 05/19/2024  Primary Cardiologist: None  Electrophysiologist: Claudia Patnaude Cortland Ding, MD    Subjective   Pt converted to sinus bradycardia on Tikosyn 500 mcg BID   QTc from EKG last pm shows stable QTc at  The patient is doing well today.  At this time, the patient denies chest pain, shortness of breath, or any new concerns.  Inpatient Medications    Scheduled Meds:  allopurinol   100 mg Oral Daily   amLODipine   7.5 mg Oral Daily   apixaban   5 mg Oral BID   carvedilol   12.5 mg Oral BID WC   cyanocobalamin  1,000 mcg Oral Daily   dofetilide  500 mcg Oral BID   ezetimibe   10 mg Oral Daily   irbesartan   300 mg Oral Daily   isosorbide  dinitrate  5 mg Oral BID   levothyroxine   100 mcg Oral QAC breakfast   loratadine   10 mg Oral Daily   off the beat book   Does not apply Once   potassium chloride   40 mEq Oral Once   rosuvastatin   40 mg Oral Daily   sodium chloride  flush  3 mL Intravenous Q12H   Continuous Infusions:  sodium chloride      PRN Meds: sodium chloride , acetaminophen , sodium chloride  flush, traMADol    Vital Signs    Vitals:   05/18/24 1924 05/18/24 2200 05/18/24 2359 05/19/24 0448  BP: (!) 157/94  114/70 123/74  Pulse: 91 96 (!) 55 (!) 52  Resp: 18 16 20 19   Temp: 98 F (36.7 C)  98 F (36.7 C) 98.1 F (36.7 C)  TempSrc: Oral  Oral Oral  SpO2: 97% 96% 96% 95%  Weight:      Height:        Intake/Output Summary (Last 24 hours) at 05/19/2024 0347 Last data filed at 05/18/2024 2134 Gross per 24 hour  Intake 3 ml  Output --  Net 3 ml   Filed Weights   05/18/24 1559  Weight: 92.1 kg    Physical Exam    GEN- NAD, A&O x 3. Normal affect.  Lungs- CTAB, Normal effort.  Heart- Regular rate and rhythm. No M/G/R GI- Soft, NT, ND Extremities- No clubbing, cyanosis, or edema Skin- no rash or lesion  Labs    CBC No results for input(s): "WBC", "NEUTROABS", "HGB",  "HCT", "MCV", "PLT" in the last 72 hours. Basic Metabolic Panel Recent Labs    42/59/56 1020 05/19/24 0359  NA 143 138  K 3.8 3.9  CL 105 107  CO2 25 20*  GLUCOSE 89 95  BUN 16 17  CREATININE 0.95 1.05*  CALCIUM  9.3 8.8*  MG 2.3 2.2    Telemetry    Converted to SB ~2300 with occasional PVC, brief SVT (personally reviewed)  Patient Profile     Claudia Cantrell is a 78 y.o. female with a past medical history significant for persistent atrial fibrillation.  They were admitted for tikosyn load.   Assessment & Plan    #) Persistent atrial fibrillation Pt converted to sinus rhythm on Tikosyn 500 mcg BID  Continue Eliquis  Creatinine, ser  1.05* (06/10 0359) Magnesium   2.2 (06/10 0359) Potassium3.9 (06/10 0359) Supplement K  Plan for home Thursday if QTc remains stable.   #) HTN Well controlled, continue 300mg  irbesartan   #) CAD No ischemic s/s  #) OSA Continue nightly CPAP  #) facial / tooth pain No complaints this AM, continue to  monitor   For questions or updates, please contact [facility] Please consult [email or web address] for contact info under Cardiology/STEMI.  Signed, Claudia Holly, NP  05/19/2024, 7:22 AM   I have seen and examined this patient with Claudia Cantrell.  Agree with above, note added to reflect my findings.  Feeling well without acute complaint.  GEN: No acute distress.   Neck: No JVD Cardiac: RRR, no murmurs, rubs, or gallops.  Respiratory: normal BS bases bilaterally. GI: Soft, nontender, non-distended  MS: No edema; No deformity. Neuro:  Nonfocal  Skin: warm and dry,  Psych: Normal affect    Persistent atrial fibrillation: Has converted to sinus rhythm.  Feels well without complaint.  Claudia Cantrell supplement potassium today.  QTc remained stable on dofetilide. Hypertension: Continue home medications Coronary artery disease: No ischemic symptoms\obstructive Obstructive sleep apnea: Continue CPAP    Claudia Wickwire M. Pailyn Bellevue MD 05/19/2024 2:38 PM

## 2024-05-19 NOTE — Progress Notes (Signed)
 Morning EKG reviewed     Shows remains in NSR with stable QTc at 425 ms.  Continue  Tikosyn 500 mcg BID.   Potassium3.9 (06/10 0359) Magnesium   2.2 (06/10 0359) Creatinine, ser  1.05* (06/10 0359)  Plan for home Thursday if QTc remains stable   Claudia Lebon, NP  05/19/2024 1:33 PM

## 2024-05-19 NOTE — TOC CM/SW Note (Signed)
 Transition of Care [facility]) - Inpatient Brief Assessment   Patient Details  Name: Claudia Cantrell MRN: [id number] Date of Birth: [birth year]  [facility] of [facility]) CM/SW Contact:    Cosimo Diones, RN Phone Number: 05/19/2024, 12:08 PM   Clinical Narrative: Patient presented for Tikosyn Load. Case Manager spoke with the patient regarding co pay cost. Patient is agreeable to cost and would like to have the initial Rx filled via [facility] and the Rx refills 90 day supply escribed to [facility]. No further needs identified at this time.   Transition of Care Asessment: Insurance and Status: Insurance coverage has been reviewed Patient has primary care physician: Yes Home environment has been reviewed: reviewed Prior level of function:: independent Prior/Current Home Services: No current home services Social Drivers of Health Review: SDOH reviewed no interventions necessary Readmission risk has been reviewed: Yes Transition of care needs: no transition of care needs at this time

## 2024-05-20 DIAGNOSIS — I4819 Other persistent atrial fibrillation: Secondary | ICD-10-CM | POA: Diagnosis not present

## 2024-05-20 LAB — BASIC METABOLIC PANEL WITH GFR
Anion gap: 9 (ref 5–15)
BUN: 21 mg/dL (ref 8–23)
CO2: 25 mmol/L (ref 22–32)
Calcium: 9.3 mg/dL (ref 8.9–10.3)
Chloride: 103 mmol/L (ref 98–111)
Creatinine, Ser: 1.08 mg/dL — ABNORMAL HIGH (ref 0.44–1.00)
GFR, Estimated: 54 mL/min — ABNORMAL LOW (ref >60)
Glucose, Bld: 96 mg/dL (ref 70–99)
Potassium: 4.1 mmol/L (ref 3.5–5.1)
Sodium: 137 mmol/L (ref 135–145)

## 2024-05-20 LAB — MAGNESIUM: Magnesium: 2.4 mg/dL (ref 1.7–2.4)

## 2024-05-20 MED ORDER — CARVEDILOL 3.125 MG PO TABS
3.1250 mg | ORAL_TABLET | Freq: Two times a day (BID) | ORAL | Status: DC
  Administered 2024-05-20: 3.125 mg via ORAL
  Filled 2024-05-20: qty 1

## 2024-05-20 NOTE — Progress Notes (Addendum)
 Pharmacy: Dofetilide (Tikosyn) - Follow Up Assessment and Electrolyte Replacement  Pharmacy consulted to assist in monitoring and replacing electrolytes in this 78 y.o. female admitted on 05/18/2024 undergoing dofetilide initiation. First dofetilide dose: 500 mcg BID; started 05/18/24  Labs:    Component Value Date/Time   K 4.1 05/20/2024 0359   MG 2.4 05/20/2024 0359     Plan: Potassium: K >/= 4: No additional supplementation needed  Magnesium : Mg > 2: No additional supplementation needed   As patient has required on average 26 mEq of potassium replacement every day, recommend discharging patient with prescription for:  Potassium chloride  20 mEq  daily  Thank you for allowing pharmacy to participate in this patient's care   Albino Alu 05/20/2024  7:08 AM

## 2024-05-20 NOTE — Plan of Care (Signed)
  Problem: Education: Goal: Knowledge of General Education information will improve Description: Including pain rating scale, medication(s)/side effects and non-pharmacologic comfort measures Outcome: Progressing   Problem: Health Behavior/Discharge Planning: Goal: Ability to manage health-related needs will improve Outcome: Progressing   Problem: Clinical Measurements: Goal: Ability to maintain clinical measurements within normal limits will improve Outcome: Progressing Goal: Will remain free from infection Outcome: Progressing Goal: Diagnostic test results will improve Outcome: Progressing Goal: Respiratory complications will improve Outcome: Progressing Goal: Cardiovascular complication will be avoided Outcome: Progressing   Problem: Pain Managment: Goal: General experience of comfort will improve and/or be controlled Outcome: Progressing   Problem: Elimination: Goal: Will not experience complications related to bowel motility Outcome: Progressing Goal: Will not experience complications related to urinary retention Outcome: Progressing   Problem: Safety: Goal: Ability to remain free from injury will improve Outcome: Progressing

## 2024-05-20 NOTE — Progress Notes (Signed)
 Morning EKG reviewed     Shows remains in NSR with stable QTc at 453 ms.  Continue  Tikosyn 500 mcg BID.   Potassium4.1 (06/11 0359) Magnesium   2.4 (06/11 0359) Creatinine, ser  1.08* (06/11 0359)  Plan for home Thursday if QTc remains stable   Lucille Crichlow, NP  05/20/2024 11:46 AM

## 2024-05-20 NOTE — Progress Notes (Addendum)
 Electrophysiology Rounding Note  Patient Name: Claudia Cantrell Date of Encounter: 05/20/2024  Primary Cardiologist: None  Electrophysiologist: Ottilie Wigglesworth Cortland Ding, MD    Subjective   Pt remains in NSR on Tikosyn 500 mcg BID   QTc from EKG last pm shows stable QTc at  The patient is doing well today.  At this time, the patient denies chest pain, shortness of breath, or any new concerns. She states her tooth pain is improving.  Inpatient Medications    Scheduled Meds:  allopurinol   100 mg Oral Daily   amLODipine   7.5 mg Oral Daily   apixaban   5 mg Oral BID   carvedilol   6.25 mg Oral BID WC   cyanocobalamin  1,000 mcg Oral Daily   dofetilide  500 mcg Oral BID   ezetimibe   10 mg Oral Daily   irbesartan   300 mg Oral Daily   isosorbide  dinitrate  5 mg Oral BID   levothyroxine   100 mcg Oral QAC breakfast   loratadine   10 mg Oral Daily   rosuvastatin   40 mg Oral Daily   sodium chloride  flush  3 mL Intravenous Q12H   Continuous Infusions:  PRN Meds: acetaminophen , sodium chloride  flush, traMADol    Vital Signs    Vitals:   05/19/24 2012 05/19/24 2330 05/20/24 0424 05/20/24 0821  BP: 137/87 139/73 (!) 145/72 (!) 145/72  Pulse: 68 (!) 50 (!) 50 (!) 56  Resp: 18 16 18 16   Temp: 98.4 F (36.9 C) 98 F (36.7 C) 98.3 F (36.8 C) 98.1 F (36.7 C)  TempSrc: Oral Oral Oral Oral  SpO2: 98%  98% 97%  Weight:      Height:        Intake/Output Summary (Last 24 hours) at 05/20/2024 0925 Last data filed at 05/19/2024 2000 Gross per 24 hour  Intake 600 ml  Output --  Net 600 ml   Filed Weights   05/18/24 1559  Weight: 92.1 kg    Physical Exam    GEN- NAD, A&O x 3. Normal affect.  Lungs- CTAB, Normal effort.  Heart- Regular rate and rhythm. No M/G/R GI- Soft, NT, ND Extremities- No clubbing, cyanosis, or edema Skin- no rash or lesion  Labs    CBC No results for input(s): WBC, NEUTROABS, HGB, HCT, MCV, PLT in the last 72 hours. Basic Metabolic  Panel Recent Labs    05/19/24 0359 05/20/24 0359  NA 138 137  K 3.9 4.1  CL 107 103  CO2 20* 25  GLUCOSE 95 96  BUN 17 21  CREATININE 1.05* 1.08*  CALCIUM  8.8* 9.3  MG 2.2 2.4    Telemetry    SR 40-70s (personally reviewed)  Patient Profile     Claudia Cantrell is a 78 y.o. female with a past medical history significant for persistent atrial fibrillation.  They were admitted for tikosyn load.   Assessment & Plan    Persistent atrial fibrillation Pt remains in NSR on Tikosyn 500 mcg BID  Continue Eliquis  Creatinine, ser  1.08* (06/11 0359) Magnesium   2.4 (06/11 0359) Potassium4.1 (06/11 0359) No electrolyte supplementation needed  Plan for home Thursday if QTc remains stable.  #) HTN Well controlled, continue 300mg  irbesartan    #) CAD No ischemic s/s   #) OSA Continue nightly CPAP   #) facial / tooth pain No complaints this AM Continue tylenol  PRN Encouraged her to call dentist for appt to eval   For questions or updates, please contact [facility] Please consult [email or web address]  for contact info under Cardiology/STEMI.  Signed, Adaline Holly, NP  05/20/2024, 9:25 AM   I have seen and examined this patient with Suzann Riddle.  Agree with above, note added to reflect my findings.  Patient continues to feel well.  Remains in sinus rhythm.  No acute complaints.  GEN: No acute distress.   Neck: No JVD Cardiac: RRR, no murmurs, rubs, or gallops.  Respiratory: normal BS bases bilaterally. GI: Soft, nontender, non-distended  MS: No edema; No deformity. Neuro:  Nonfocal  Skin: warm and dry,  Psych: Normal affect    Persistent atrial fibrillation: On dofetilide.  No need for electrolyte supplementation.  QTc remains stable.  Vahe Pienta continue with current management.  Likely plan for discharge tomorrow. Hypertension: Well-controlled.  Continue home medications Coronary artery disease: No current ischemic symptoms Sleep apnea: Continue CPAP  Sylvester Salonga M. Jeralyn Nolden  MD 05/20/2024 8:18 PM

## 2024-05-21 ENCOUNTER — Other Ambulatory Visit (HOSPITAL_COMMUNITY): Payer: Self-pay

## 2024-05-21 ENCOUNTER — Ambulatory Visit: Payer: Medicare PPO | Admitting: Rheumatology

## 2024-05-21 DIAGNOSIS — Z7901 Long term (current) use of anticoagulants: Secondary | ICD-10-CM

## 2024-05-21 DIAGNOSIS — I4819 Other persistent atrial fibrillation: Secondary | ICD-10-CM | POA: Diagnosis not present

## 2024-05-21 DIAGNOSIS — Z8639 Personal history of other endocrine, nutritional and metabolic disease: Secondary | ICD-10-CM

## 2024-05-21 DIAGNOSIS — G8929 Other chronic pain: Secondary | ICD-10-CM

## 2024-05-21 DIAGNOSIS — M1A09X0 Idiopathic chronic gout, multiple sites, without tophus (tophi): Secondary | ICD-10-CM

## 2024-05-21 DIAGNOSIS — G4733 Obstructive sleep apnea (adult) (pediatric): Secondary | ICD-10-CM

## 2024-05-21 DIAGNOSIS — Z8679 Personal history of other diseases of the circulatory system: Secondary | ICD-10-CM

## 2024-05-21 DIAGNOSIS — R7309 Other abnormal glucose: Secondary | ICD-10-CM

## 2024-05-21 DIAGNOSIS — Z8509 Personal history of malignant neoplasm of other digestive organs: Secondary | ICD-10-CM

## 2024-05-21 DIAGNOSIS — R260 Ataxic gait: Secondary | ICD-10-CM

## 2024-05-21 LAB — BASIC METABOLIC PANEL WITH GFR
Anion gap: 8 (ref 5–15)
BUN: 19 mg/dL (ref 8–23)
CO2: 24 mmol/L (ref 22–32)
Calcium: 9.2 mg/dL (ref 8.9–10.3)
Chloride: 108 mmol/L (ref 98–111)
Creatinine, Ser: 1 mg/dL (ref 0.44–1.00)
GFR, Estimated: 59 mL/min — ABNORMAL LOW (ref >60)
Glucose, Bld: 88 mg/dL (ref 70–99)
Potassium: 4.2 mmol/L (ref 3.5–5.1)
Sodium: 140 mmol/L (ref 135–145)

## 2024-05-21 LAB — MAGNESIUM: Magnesium: 2.1 mg/dL (ref 1.7–2.4)

## 2024-05-21 MED ORDER — DOFETILIDE 250 MCG PO CAPS
250.0000 ug | ORAL_CAPSULE | Freq: Two times a day (BID) | ORAL | Status: DC
  Administered 2024-05-21: 250 ug via ORAL
  Filled 2024-05-21: qty 1

## 2024-05-21 MED ORDER — DOFETILIDE 250 MCG PO CAPS
250.0000 ug | ORAL_CAPSULE | Freq: Two times a day (BID) | ORAL | 3 refills | Status: DC
  Filled 2024-05-21: qty 60, 30d supply, fill #0

## 2024-05-21 NOTE — Plan of Care (Signed)

## 2024-05-21 NOTE — Progress Notes (Signed)
 EKG from yesterday evening 05/20/2024 reviewed     Shows remains in NSR with prolonged QTc at 496 ms.  Reduce  Tikosyn 250 mcg BID.   Potassium4.2 (06/12 0431) Magnesium   2.1 (06/12 0431) Creatinine, ser  1.00 (06/12 0431)  Plan for home this afternoon if QTc remains stable.   Kinberly Perris, NP  05/21/2024 7:41 AM

## 2024-05-21 NOTE — Discharge Summary (Addendum)
 ELECTROPHYSIOLOGY DISCHARGE SUMMARY    Patient ID: Claudia Cantrell,  MRN: [id number], DOB/AGE: January 04, 1946 78 y.o.  Admit date: 05/18/2024 Discharge date: 05/21/2024  Primary Care Physician: Abram Abraham, NP-C  Primary Cardiologist: None  Electrophysiologist: Dr. Lawana Pray   Primary Discharge Diagnosis:  1.  Persistent atrial fibrillation status post Tikosyn loading this admission  Secondary Discharge Diagnosis:    Allergies  Allergen Reactions   Labetalol     headaches   Codeine Anxiety     Procedures This Admission:  1.  Tikosyn loading    Brief HPI: Claudia Cantrell is a 78 y.o. female with a past medical history as noted above.  They were referred to EP for treatment options of atrial fibrillation.  Risks, benefits, and alternatives to Tikosyn were reviewed with the patient who wished to proceed with admission for loading.  Hospital Course:  The patient was admitted and Tikosyn was initiated.  Renal function and electrolytes were followed during the hospitalization.  Their QTc remained stable. The patient converted chemically and did not require cardioversion. The patients QT prolonged, requiring dose reduction to 250 mcg BID. She also developed bradycardia, so coreg  was stopped. They were monitored on telemetry up to discharge. On the day of discharge, they were examined by Dr. Lawana Pray  who considered them stable for discharge to home.  Follow-up has been arranged with the Atrial Fibrillation clinic in approximately 1 week.   Physical Exam: Vitals:   05/20/24 1930 05/21/24 0006 05/21/24 0433 05/21/24 0819  BP: 130/71 (!) 148/91 (!) 149/73 (!) 159/82  Pulse: (!) 52 (!) 50  (!) 54  Resp: 17 16 18 17   Temp: 98 F (36.7 C) 98 F (36.7 C) 98.2 F (36.8 C) 98.2 F (36.8 C)  TempSrc: Oral Oral Oral Oral  SpO2: 99%   100%  Weight:      Height:        GEN- NAD, A&O x 3. Normal affect.  Lungs- CTAB, Normal effort.  Heart- Regular rate and rhythm. No M/G/R GI- Soft,  NT, ND Extremities- No clubbing, cyanosis, or edema Skin- no rash or lesion  Labs:   Lab Results  Component Value Date   WBC 3.3 (L) 05/01/2024   HGB 15.5 (H) 05/01/2024   HCT 47.3 (H) 05/01/2024   MCV 94.6 05/01/2024   PLT 197.0 05/01/2024    Recent Labs  Lab 05/21/24 0431  NA 140  K 4.2  CL 108  CO2 24  BUN 19  CREATININE 1.00  CALCIUM  9.2  GLUCOSE 88    Discharge Medications:  Allergies as of 05/21/2024       Reactions   Labetalol    headaches   Codeine Anxiety        Medication List     STOP taking these medications    carvedilol  12.5 MG tablet Commonly known as: COREG        TAKE these medications    acetaminophen  500 MG tablet Commonly known as: TYLENOL  Take 1,000 mg by mouth every 6 (six) hours as needed for moderate pain.   allopurinol  100 MG tablet Commonly known as: ZYLOPRIM  Take 1 tablet (100 mg total) by mouth daily.   amLODipine  5 MG tablet Commonly known as: NORVASC  Take 1.5 tablets (7.5 mg total) by mouth daily.   Comirnaty syringe Generic drug: COVID-19 mRNA vaccine (Pfizer)   cyanocobalamin 1000 MCG tablet Commonly known as: VITAMIN B12 Take 1,000 mcg by mouth daily.   dapagliflozin propanediol 10 MG Tabs tablet  Commonly known as: FARXIGA Take 10 mg by mouth in the morning.   dofetilide 250 MCG capsule Commonly known as: TIKOSYN Take 1 capsule (250 mcg total) by mouth 2 (two) times daily.   Eliquis  5 MG Tabs tablet Generic drug: apixaban  Take 1 tablet by mouth twice daily   ezetimibe  10 MG tablet Commonly known as: ZETIA  Take 1 tablet (10 mg total) by mouth daily.   Fluad 0.5 ML injection Generic drug: influenza vaccine adjuvanted   furosemide  20 MG tablet Commonly known as: LASIX  Take 20 mg by mouth daily as needed for edema.   isosorbide  dinitrate 5 MG tablet Commonly known as: ISORDIL  Take 1 tablet by mouth twice daily   loratadine  10 MG tablet Commonly known as: CLARITIN  Take 1 tablet (10 mg total)  by mouth daily.   OCUVITE ADULT 50+ PO Take 1 tablet by mouth daily.   olmesartan  40 MG tablet Commonly known as: BENICAR  Take 1 tablet (40 mg total) by mouth daily.   rosuvastatin  40 MG tablet Commonly known as: CRESTOR  Take 1 tablet by mouth once daily   levothyroxine  112 MCG tablet Commonly known as: SYNTHROID  Take 112 mcg by mouth daily before breakfast.   Synthroid  100 MCG tablet Generic drug: levothyroxine  Take 1 tablet (100 mcg total) by mouth daily before breakfast.        Disposition:  Home with follow up in AF clinic in 1 week as in AVS.   Duration of Discharge Encounter:  APP time: 58  Signed, Adaline Holly, NP  05/21/2024 12:09 PM    I have seen and examined this patient with Suzann Riddle.  Agree with above, note added to reflect my findings.  Patient admitted to the hospital with persistent atrial fibrillation for dofetilide load.  She converted to sinus rhythm without need for cardioversion.  Her QTc became prolonged and thus her dose was reduced.  Daeton Kluth plan for discharge today with follow-up in A-fib clinic.  GEN: No acute distress.   Neck: No JVD Cardiac: RRR, no murmurs, rubs, or gallops.  Respiratory: normal BS bases bilaterally. GI: Soft, nontender, non-distended  MS: No edema; No deformity. Neuro:  Nonfocal  Skin: warm and dry,  Psych: Normal affect   MD time of discharge: 20 minutes  Diesel Lina M. Levorn Oleski MD 05/21/2024 4:31 PM

## 2024-05-21 NOTE — Progress Notes (Signed)
 Pharmacy: Dofetilide (Tikosyn) - Follow Up Assessment and Electrolyte Replacement  Pharmacy consulted to assist in monitoring and replacing electrolytes in this 78 y.o. female admitted on 05/18/2024 undergoing dofetilide initiation. First dofetilide dose: 500 mcg BID; started 05/18/24   Labs:    Component Value Date/Time   K 4.2 05/21/2024 0431   MG 2.1 05/21/2024 0431     Plan: Potassium: K >/= 4: No additional supplementation needed  Magnesium : Mg > 2: No additional supplementation needed  Do not recommend discharging patient on schedule potassium supplement.  Thank you for allowing pharmacy to participate in this patient's care   Albino Alu 05/21/2024  7:09 AM

## 2024-05-21 NOTE — Progress Notes (Signed)
 Discharge instructions (including medications) discussed with and copy provided to patient/caregiver

## 2024-05-21 NOTE — Plan of Care (Signed)
  Problem: Education: Goal: Knowledge of General Education information will improve Description: Including pain rating scale, medication(s)/side effects and non-pharmacologic comfort measures Outcome: Adequate for Discharge   Problem: Health Behavior/Discharge Planning: Goal: Ability to manage health-related needs will improve Outcome: Adequate for Discharge   Problem: Clinical Measurements: Goal: Ability to maintain clinical measurements within normal limits will improve Outcome: Adequate for Discharge Goal: Will remain free from infection Outcome: Adequate for Discharge Goal: Diagnostic test results will improve Outcome: Adequate for Discharge Goal: Respiratory complications will improve Outcome: Adequate for Discharge Goal: Cardiovascular complication will be avoided Outcome: Adequate for Discharge   Problem: Activity: Goal: Risk for activity intolerance will decrease Outcome: Adequate for Discharge   Problem: Nutrition: Goal: Adequate nutrition will be maintained Outcome: Adequate for Discharge   Problem: Coping: Goal: Level of anxiety will decrease Outcome: Adequate for Discharge   Problem: Elimination: Goal: Will not experience complications related to bowel motility Outcome: Adequate for Discharge Goal: Will not experience complications related to urinary retention Outcome: Adequate for Discharge   Problem: Safety: Goal: Ability to remain free from injury will improve Outcome: Adequate for Discharge   Problem: Skin Integrity: Goal: Risk for impaired skin integrity will decrease Outcome: Adequate for Discharge   Problem: Education: Goal: Knowledge of disease or condition will improve Outcome: Adequate for Discharge Goal: Understanding of medication regimen will improve Outcome: Adequate for Discharge Goal: Individualized Educational Video(s) Outcome: Adequate for Discharge   Problem: Activity: Goal: Ability to tolerate increased activity will  improve Outcome: Adequate for Discharge   Problem: Cardiac: Goal: Ability to achieve and maintain adequate cardiopulmonary perfusion will improve Outcome: Adequate for Discharge   Problem: Health Behavior/Discharge Planning: Goal: Ability to safely manage health-related needs after discharge will improve Outcome: Adequate for Discharge

## 2024-05-22 ENCOUNTER — Telehealth: Payer: Self-pay

## 2024-05-22 NOTE — Patient Instructions (Signed)
 Visit Information  Thank you for taking time to visit with me today. Please don't hesitate to contact me if I can be of assistance to you before our next scheduled telephone appointment.  Our next appointment is by telephone on 05/29/24 at 3pm  Following is a copy of your care plan:   Goals Addressed             This Visit's Progress    VBCI Transitions of Care (TOC) Care Plan       Problems:  Recent Hospitalization for treatment of Persistent atrial fibrillation status post Tikosyn  loading this admission   Goal:  Over the next 30 days, the patient will not experience hospital readmission  Interventions:  Transitions of Care: Doctor Visits  - discussed the importance of doctor visits Reviewed medications-patient has a very good understanding of her medications and is holding Carvedilol  as directed and is taking Tikosyn  as prescribed Reviewed SDOH - patient denies issues-patient lives alone and states she is independent and still drives, has adult son and daughter nearby and daughter in [place] Reviewed importance of scheduling PCP follow up (patient prefers to call and schedule this appointment herself) Patient has cardio follow up appointment with Valeda Garter 05/28/24  AFIB Interventions:   Reviewed importance of adherence to anticoagulant exactly as prescribed Counseled on bleeding risk associated with Eliquis  and importance of self-monitoring for signs/symptoms of bleeding Counseled on avoidance of NSAIDs due to increased bleeding risk with anticoagulants  Patient Self Care Activities:  Attend all scheduled provider appointments Call pharmacy for medication refills 3-7 days in advance of running out of medications Call provider office for new concerns or questions  Notify RN Care Manager of TOC call rescheduling needs Participate in Transition of Care Program/Attend TOC scheduled calls Take medications as prescribed   make a plan to eat healthy take medicine as  prescribed  Plan:  Telephone follow up appointment with care management team member scheduled for:  05/11/24 3pm with primary [facility] RN Laine Tousey The patient has been provided with contact information for the care management team and has been advised to call with any health related questions or concerns.         Patient verbalizes understanding of instructions and care plan provided today and agrees to view in MyChart. Active MyChart status and patient understanding of how to access instructions and care plan via MyChart confirmed with patient.     Telephone follow up appointment with care management team member scheduled for:05/29/24 3pm with primary [facility] RN Laine Tousey The patient has been provided with contact information for the care management team and has been advised to call with any health related questions or concerns.   Please call the care guide team at [phone] if you need to cancel or reschedule your appointment.   Please call the Suicide and Crisis Lifeline: 988 call 1-800-273-TALK (toll free, 24 hour hotline) call 911 if you are experiencing a Mental Health or Behavioral Health Crisis or need someone to talk to.  Tonia Frankel RN, CCM [place]  VBCI-Population Health RN Care Manager [phone]

## 2024-05-22 NOTE — Transitions of Care (Post Inpatient/ED Visit) (Signed)
 05/22/2024  Name: Claudia Cantrell MRN: [id number] DOB: 1946-12-02  Today's TOC FU Call Status: Today's TOC FU Call Status:: Successful TOC FU Call Completed TOC FU Call Complete Date: 05/22/24 Patient's Name and Date of Birth confirmed.  Transition Care Management Follow-up Telephone Call Date of Discharge: 05/21/24 Discharge Facility: [facility]) Type of Discharge: Inpatient Admission Primary Inpatient Discharge Diagnosis:: Persistent atrial fibrillation status post Tikosyn  loading this admission How have you been since you were released from the hospital?: Better Any questions or concerns?: No  Items Reviewed: Did you receive and understand the discharge instructions provided?: Yes Medications obtained,verified, and reconciled?: Yes (Medications Reviewed) Any new allergies since your discharge?: No Dietary orders reviewed?: Yes Type of Diet Ordered:: Low sodium Heart Healthy Do you have support at home?: Yes People in Home [RPT]: alone Name of Support/Comfort Primary Source: son and daughter  Medications Reviewed Today: Medications Reviewed Today     Reviewed by Sharmaine Dearth, RN (Registered Nurse) on 05/22/24 at 1050  Med List Status: <None>   Medication Order Taking? Sig Documenting Provider Last Dose Status Informant  acetaminophen  (TYLENOL ) 500 MG tablet [id number] Yes Take 1,000 mg by mouth every 6 (six) hours as needed for moderate pain. [provider]  Active Self  allopurinol  (ZYLOPRIM ) 100 MG tablet [id number] Yes Take 1 tablet (100 mg total) by mouth daily. Romayne Clubs, PA-C  Active Self  amLODipine  (NORVASC ) 5 MG tablet [id number] Yes Take 1.5 tablets (7.5 mg total) by mouth daily. Millicent Ally, MD  Active Self  apixaban  (ELIQUIS ) 5 MG TABS tablet [id number] Yes Take 1 tablet by mouth twice daily Millicent Ally, MD  Active Self  COMIRNATY syringe [id number]    Patient not taking: Reported on 05/22/2024   [provider]  Active Self   cyanocobalamin  (VITAMIN B12) 1000 MCG tablet [id number] Yes Take 1,000 mcg by mouth daily. [provider]  Active Self  dapagliflozin propanediol (FARXIGA) 10 MG TABS tablet [id number] Yes Take 10 mg by mouth in the morning. [provider]  Active Self  dofetilide  (TIKOSYN ) 250 MCG capsule [id number] Yes Take 1 capsule (250 mcg total) by mouth 2 (two) times daily. Riddle, Suzann, NP  Active   ezetimibe  (ZETIA ) 10 MG tablet [id number] Yes Take 1 tablet (10 mg total) by mouth daily. Millicent Ally, MD  Active Self  FLUAD 0.5 ML injection [id number]    Patient not taking: Reported on 05/22/2024   [provider]  Active Self  furosemide  (LASIX ) 20 MG tablet [id number] Yes Take 20 mg by mouth daily as needed for edema. [provider]  Active Self  isosorbide  dinitrate (ISORDIL ) 5 MG tablet [id number] Yes Take 1 tablet by mouth twice daily Camnitz, Will Gaylyn Keas, MD  Active Self  levothyroxine  (SYNTHROID ) 112 MCG tablet [id number] Yes Take 112 mcg by mouth daily before breakfast. [provider]  Active Self  loratadine  (CLARITIN ) 10 MG tablet [id number]  Take 1 tablet (10 mg total) by mouth daily.  Patient not taking: Reported on 05/22/2024   Abram Abraham, NP-C  Active Self  Multiple Vitamins-Minerals (OCUVITE ADULT 50+ PO) [id number]  Take 1 tablet by mouth daily.  Patient not taking: Reported on 05/22/2024   [provider]  Active Self  olmesartan  (BENICAR ) 40 MG tablet [id number] Yes Take 1 tablet (40 mg total) by mouth daily. Lei Pump, MD  Active Self  rosuvastatin  (CRESTOR ) 40 MG tablet [id number] Yes Take 1 tablet by mouth once daily  Lei Pump, MD  Active Self  SYNTHROID  100 MCG tablet [id number]  Take 1 tablet (100 mcg total) by mouth daily before breakfast.  Patient not taking: Reported on 05/22/2024   Shamleffer, Julian Obey, MD  Active Self            Home Care and Equipment/Supplies: Were Home Health Services  Ordered?: No Any new equipment or medical supplies ordered?: No  Functional Questionnaire: Do you need assistance with bathing/showering or dressing?: No Do you need assistance with meal preparation?: No Do you need assistance with eating?: No Do you have difficulty maintaining continence: No Do you need assistance with getting out of bed/getting out of a chair/moving?: No Do you have difficulty managing or taking your medications?: No  Follow up appointments reviewed: PCP Follow-up appointment confirmed?: No (TOC RN offered to help assist-patient states she wants to call herself to schedule) MD Provider Line Number:[phone] Given: No Specialist Hospital Follow-up appointment confirmed?: Yes Date of Specialist follow-up appointment?: 05/28/24 Follow-Up Specialty Provider:: Clint Fenton Do you need transportation to your follow-up appointment?: No Do you understand care options if your condition(s) worsen?: Yes-patient verbalized understanding  SDOH Interventions Today    Flowsheet Row Most Recent Value  SDOH Interventions   Food Insecurity Interventions Intervention Not Indicated  Housing Interventions Intervention Not Indicated  Transportation Interventions Intervention Not Indicated  Utilities Interventions Intervention Not Indicated    Goals Addressed             This Visit's Progress    VBCI Transitions of Care (TOC) Care Plan       Problems:  Recent Hospitalization for treatment of Persistent atrial fibrillation status post Tikosyn  loading this admission   Goal:  Over the next 30 days, the patient will not experience hospital readmission  Interventions:  Transitions of Care: Doctor Visits  - discussed the importance of doctor visits Reviewed medications-patient has a very good understanding of her medications and is holding Carvedilol  as directed and is taking Tikosyn  as prescribed Reviewed SDOH - patient denies issues-patient lives alone and states she is  independent and still drives, has adult son and daughter nearby and daughter in [place] Reviewed importance of scheduling PCP follow up (patient prefers to call and schedule this appointment herself) Patient has cardio follow up appointment with Valeda Garter 05/28/24  AFIB Interventions:   Reviewed importance of adherence to anticoagulant exactly as prescribed Counseled on bleeding risk associated with Eliquis  and importance of self-monitoring for signs/symptoms of bleeding Counseled on avoidance of NSAIDs due to increased bleeding risk with anticoagulants  Patient Self Care Activities:  Attend all scheduled provider appointments Call pharmacy for medication refills 3-7 days in advance of running out of medications Call provider office for new concerns or questions  Notify RN Care Manager of TOC call rescheduling needs Participate in Transition of Care Program/Attend TOC scheduled calls Take medications as prescribed   make a plan to eat healthy take medicine as prescribed  Plan:  Telephone follow up appointment with care management team member scheduled for:  05/11/24 3pm with primary [facility] RN Laine Tousey The patient has been provided with contact information for the care management team and has been advised to call with any health related questions or concerns.         Tonia Frankel RN, CCM [place]  VBCI-Population Health RN Care Manager [phone]

## 2024-05-28 ENCOUNTER — Ambulatory Visit (HOSPITAL_COMMUNITY)
Admit: 2024-05-28 | Discharge: 2024-05-28 | Disposition: A | Discharge status: Home / Self Care | Attending: Physician Assistant | Admitting: Physician Assistant

## 2024-05-28 ENCOUNTER — Encounter (HOSPITAL_COMMUNITY): Payer: Self-pay | Admitting: Physician Assistant

## 2024-05-28 VITALS — BP 132/72 | HR 69 | Ht 65.0 in | Wt 199.6 lb

## 2024-05-28 DIAGNOSIS — I4819 Other persistent atrial fibrillation: Secondary | ICD-10-CM

## 2024-05-28 DIAGNOSIS — Z79899 Other long term (current) drug therapy: Secondary | ICD-10-CM | POA: Diagnosis not present

## 2024-05-28 DIAGNOSIS — D6869 Other thrombophilia: Secondary | ICD-10-CM | POA: Diagnosis not present

## 2024-05-28 DIAGNOSIS — Z5181 Encounter for therapeutic drug level monitoring: Secondary | ICD-10-CM

## 2024-05-28 MED ORDER — DOFETILIDE 250 MCG PO CAPS: 250.0000 ug | ORAL_CAPSULE | Freq: Two times a day (BID) | ORAL | 1 refills | Status: DC

## 2024-05-28 NOTE — Progress Notes (Signed)
 Primary Care Physician: Abram Abraham, NP-C Referring Physician: Dr. Jaquita Merl is a 78 y.o. female with a h/o atrial fibrillation, CAD, OSA, HTN who presents for follow up in the [facility]. Patient is s/p afib ablation with Dr Lawana Pray on 07/28/21. She had done well with no know episodes of afib until she presented to the ED on 03/24/22 with chest pain. She tested positive for COVID at that time. ECG showed rate controlled afib. Seen by Dr Loetta Ringer on 05/15/22 and she was still in afib, her carvedilol  was increased.   Patient is s/p DCCV 05/31/22 which was unsuccessful after 4 shocks. She has symptoms of fatigue and exercise intolerance when in afib. She was started on amiodarone  as a bridge to possible repeat ablation. S/p DCCV on 07/18/22.  Patient is s/p afib ablation with Dr Lawana Pray on 12/14/22. She had recurrence of her afib and was seen by Dr Lawana Pray on 04/07/24 who recommended dofetilide .   Patient returns for follow up for atrial fibrillation and dofetilide  monitoring. She is s/p dofetilide  loading 6/9-6/12/25. She converted with the medication and did not require DCCV. Carvedilol  was discontinued due to bradycardia. She reports that she has done well since discharge. She remains in SR. Her energy level has improved. No bleeding issues on anticoagulation.   Today, she  denies symptoms of palpitations, chest pain, shortness of breath, orthopnea, PND, lower extremity edema, dizziness, presyncope, syncope, snoring, daytime somnolence, bleeding, or neurologic sequela. The patient is tolerating medications without difficulties and is otherwise without complaint today.    Past Medical History:  Diagnosis Date   Arthritis    rheumatoid   Back pain    CAD (coronary artery disease)    a. 1992 s/p MI and PTCA of unknown vessel;  b. 09/2010 Cath: LM nl, LAD 20p, LCX 71m, RCA 64m, RPL 20.   Cancer [facility])    Cervical cancer (HCC) 1977   Chest pain    Chronic  kidney disease    Constipation    Family history of anesthesia complication    daughter has difficulty waking    GERD (gastroesophageal reflux disease)    occ   Gout 10/08/2016   History of heart attack    Hyperlipidemia    Hypertensive heart disease    Hypothyroidism    Joint pain    Nonischemic cardiomyopathy (HCC)    a. 07/2016 Echo: EF 40-45%, mild LVH, inferior akinesis, moderately dilated left atrium, trivial AI and MR.   PAF (paroxysmal atrial fibrillation) (HCC)    a. 07/2016 Admitted w/ AF RVR-->CHA2DS2VASc = 5-->Eliquis ;  b. 07/2016 successful TEE/DCCV.   Pre-diabetes    Sleep apnea    a. Using CPAP.   SOB (shortness of breath)    Vitamin D  deficiency     Current Outpatient Medications  Medication Sig Dispense Refill   acetaminophen  (TYLENOL ) 500 MG tablet Take 1,000 mg by mouth every 6 (six) hours as needed for moderate pain.     allopurinol  (ZYLOPRIM ) 100 MG tablet Take 1 tablet (100 mg total) by mouth daily. 30 tablet 2   amLODipine  (NORVASC ) 5 MG tablet Take 1.5 tablets (7.5 mg total) by mouth daily. 45 tablet 1   apixaban  (ELIQUIS ) 5 MG TABS tablet Take 1 tablet by mouth twice daily 180 tablet 1   COMIRNATY syringe      cyanocobalamin  (VITAMIN B12) 1000 MCG tablet Take 1,000 mcg by mouth daily.     dapagliflozin propanediol (FARXIGA) 10 MG  TABS tablet Take 10 mg by mouth in the morning.     dofetilide  (TIKOSYN ) 250 MCG capsule Take 1 capsule (250 mcg total) by mouth 2 (two) times daily. 60 capsule 3   ezetimibe  (ZETIA ) 10 MG tablet Take 1 tablet (10 mg total) by mouth daily. 90 tablet 3   FLUAD 0.5 ML injection      furosemide  (LASIX ) 20 MG tablet Take 20 mg by mouth daily as needed for edema.     isosorbide  dinitrate (ISORDIL ) 5 MG tablet Take 1 tablet by mouth twice daily 180 tablet 3   levothyroxine  (SYNTHROID ) 112 MCG tablet Take 112 mcg by mouth daily before breakfast.     loratadine  (CLARITIN ) 10 MG tablet Take 1 tablet (10 mg total) by mouth daily. 30 tablet 0    Multiple Vitamins-Minerals (OCUVITE ADULT 50+ PO) Take 1 tablet by mouth daily.     olmesartan  (BENICAR ) 40 MG tablet Take 1 tablet (40 mg total) by mouth daily. 90 tablet 3   rosuvastatin  (CRESTOR ) 40 MG tablet Take 1 tablet by mouth once daily 90 tablet 3   SYNTHROID  100 MCG tablet Take 1 tablet (100 mcg total) by mouth daily before breakfast. 90 tablet 3   No current facility-administered medications for this encounter.    ROS- All systems are reviewed and negative except as per the HPI above  Physical Exam: Vitals:   05/28/24 1443  BP: 132/72  Pulse: 69  Weight: 90.5 kg  Height: 5' 5 (1.651 m)     Wt Readings from Last 3 Encounters:  05/28/24 90.5 kg  05/18/24 92.4 kg  05/01/24 91.2 kg    GEN: Well nourished, well developed in no acute distress CARDIAC: Regular rate and rhythm, no murmurs, rubs, gallops RESPIRATORY:  Clear to auscultation without rales, wheezing or rhonchi  ABDOMEN: Soft, non-tender, non-distended EXTREMITIES:  No edema; No deformity     EKG today demonstrates SR, PAC Vent. rate 69 BPM PR interval 160 ms QRS duration 84 ms QT/QTcB 460/492 ms   Epic records reviewed    CHA2DS2-VASc Score = 6  The patient's score is based upon: CHF History: 1 HTN History: 1 Diabetes History: 1 Stroke History: 0 Vascular Disease History: 1 Age Score: 1 Gender Score: 1       ASSESSMENT AND PLAN: Persistent Atrial Fibrillation (ICD10:  I48.19) The patient's CHA2DS2-VASc score is 6, indicating a 9.7% annual risk of stroke.   S/p afib ablation 07/28/21 and 12/14/22 S/p dofetilide  loading 05/2024 Patient appears to be maintaining SR Continue dofetilide  250 mcg BID Continue Eliquis  5 mg BID  Secondary Hypercoagulable State (ICD10:  D68.69) The patient is at significant risk for stroke/thromboembolism based upon her CHA2DS2-VASc Score of 6.  Continue Apixaban  (Eliquis ). No bleeding issues.   High Risk Medication Monitoring (ICD 10: Z79.899) QT interval  on ECG acceptable for dofetilide  monitoring. Check bmet/mag today.     HTN Stable on current regimen  CAD No anginal symptoms  OSA  Encouraged nightly CPAP   Follow up in the AF clinic in one month.    Myrtha Ates PA-C Afib Clinic [facility] [address] [place], [place] [address] [phone]

## 2024-05-29 ENCOUNTER — Other Ambulatory Visit: Payer: Self-pay | Admitting: *Deleted

## 2024-05-29 ENCOUNTER — Ambulatory Visit (HOSPITAL_COMMUNITY): Payer: Self-pay | Admitting: Physician Assistant

## 2024-05-29 LAB — BASIC METABOLIC PANEL WITH GFR
BUN/Creatinine Ratio: 14 (ref 12–28)
BUN: 16 mg/dL (ref 8–27)
CO2: 20 mmol/L (ref 20–29)
Calcium: 10.1 mg/dL (ref 8.7–10.3)
Chloride: 104 mmol/L (ref 96–106)
Creatinine, Ser: 1.12 mg/dL — ABNORMAL HIGH (ref 0.57–1.00)
Glucose: 82 mg/dL (ref 70–99)
Potassium: 4.6 mmol/L (ref 3.5–5.2)
Sodium: 145 mmol/L — ABNORMAL HIGH (ref 134–144)
eGFR: 51 mL/min/{1.73_m2} — ABNORMAL LOW (ref >59)

## 2024-05-29 LAB — MAGNESIUM: Magnesium: 2.5 mg/dL — ABNORMAL HIGH (ref 1.6–2.3)

## 2024-05-29 NOTE — Patient Instructions (Signed)
 Visit Information  Thank you for taking time to visit with me today. Please don't hesitate to contact me if I can be of assistance to you before our next scheduled telephone appointment.  It has been a pleasure working with you after your recent hospital visit!  Great job managing your care!  I am glad that you are doing well!  Please do not hesitate to contact me in the future if I can be of assistance to you!  Following are the goals we discussed today:  Patient Self Care Activities:  Attend all scheduled provider appointments Call pharmacy for medication refills 3-7 days in advance of running out of medications Call provider office for new concerns or questions  Take medications as prescribed   If you believe your condition is getting worse- contact your care providers (doctors) promptly- reaching out to your doctor early when you have concerns can prevent you from having to go to the hospital  If you are experiencing a Mental Health or Behavioral Health Crisis or need someone to talk to, please  call the Suicide and Crisis Lifeline: 988 call the [place]  National Suicide Prevention Lifeline: (807)362-0418 or TTY: 918-223-9911 TTY (951)654-5898) to talk to a trained counselor call 1-800-273-TALK (toll free, 24 hour hotline) go to [facility] [address], [place] [phone]) call the [facility] Crisis Line: [phone] call 911   Patient verbalizes understanding of instructions and care plan provided today and agrees to view in MyChart. Active MyChart status and patient understanding of how to access instructions and care plan via MyChart confirmed with patient.     Claudia Peters Mckinney Caetano Oberhaus, RN, BSN, Media planner  Transitions of Care  [facility] - [facility] [phone]: direct office

## 2024-05-29 NOTE — Transitions of Care (Post Inpatient/ED Visit) (Signed)
 Transition of Care week 2/ day # 7- TOC program case closure per patient request  Visit Note  05/29/2024  Name: Claudia Cantrell MRN: [id number]          DOB: Oct 05, 1946  Situation: Patient enrolled in [facility] 30-day program. Visit completed with patient by telephone.   HIPAA identifiers x 2 verified  Background:  Recent planned hospitalization for treatment of Persistent atrial fibrillation status post Tikosyn  loading this admission June 6-12, 2025 No unplanned hospital admissions x 12- plus months  TOC 30--day outreach case closure:  patient declines further outreach, states she is doing great    Initial Transition Care Management Follow-up Telephone Call    Past Medical History:  Diagnosis Date   Arthritis    rheumatoid   Back pain    CAD (coronary artery disease)    a. 1992 s/p MI and PTCA of unknown vessel;  b. 09/2010 Cath: LM nl, LAD 20p, LCX 62m, RCA 39m, RPL 20.   Cancer [facility])    Cervical cancer (HCC) 1977   Chest pain    Chronic kidney disease    Constipation    Family history of anesthesia complication    daughter has difficulty waking    GERD (gastroesophageal reflux disease)    occ   Gout 10/08/2016   History of heart attack    Hyperlipidemia    Hypertensive heart disease    Hypothyroidism    Joint pain    Nonischemic cardiomyopathy (HCC)    a. 07/2016 Echo: EF 40-45%, mild LVH, inferior akinesis, moderately dilated left atrium, trivial AI and MR.   PAF (paroxysmal atrial fibrillation) (HCC)    a. 07/2016 Admitted w/ AF RVR-->CHA2DS2VASc = 5-->Eliquis ;  b. 07/2016 successful TEE/DCCV.   Pre-diabetes    Sleep apnea    a. Using CPAP.   SOB (shortness of breath)    Vitamin D  deficiency    Assessment:  I am doing great!  Feeling back to my normal self, having no problems.  Went to the A-Fib clinic yesterday and they told me I was still in normal rhythm and we went over all of my medications; I don't have any more questions.  They didn't tell me I need to  schedule with my PCP, so I am going to wait, until it is time to see her in August or September, unless something else comes up; I got this- you don't need to call me back anymore;   Denies clinical concerns and sounds to be in no distress throughout [facility] 30-day program outreach call today  Patient Reported Symptoms: Cognitive Cognitive Status: Alert and oriented to person, place, and time, Insightful and able to interpret abstract concepts, Normal speech and language skills Cognitive/Intellectual Conditions Management [RPT]: None reported or documented in medical history or problem list   Health Maintenance Behaviors: Annual physical exam, Healthy diet Health Facilitated by: Healthy diet, Rest  Neurological Neurological Review of Symptoms: No symptoms reported    HEENT HEENT Symptoms Reported: No symptoms reported      Cardiovascular Cardiovascular Symptoms Reported: No symptoms reported Does patient have uncontrolled Hypertension?: No Cardiovascular Conditions: Dysrhythmia, Hypertension Cardiovascular Management Strategies: Coping strategies, Medication therapy, Routine screening, Diet modification  Respiratory Respiratory Symptoms Reported: No symptoms reported Other Respiratory Symptoms: Denies shortness of breath/ cough; sounds to be in no respiratory distress throughout [facility] call    Endocrine Patient reports the following symptoms related to hypoglycemia or hyperglycemia : No symptoms reported Is patient diabetic?: No    Gastrointestinal Gastrointestinal Symptoms  Reported: No symptoms reported Additional Gastrointestinal Details: Reports great appetite and normal and regular BM's post- recent hospital admission      Genitourinary Genitourinary Symptoms Reported: No symptoms reported Additional Genitourinary Details: Reports peeing fine, normally, in good amounts- I am on a fluid pill, as needed- I don't need it very often    Integumentary Integumentary Symptoms Reported: No  symptoms reported    Musculoskeletal Musculoskelatal Symptoms Reviewed: No symptoms reported Additional Musculoskeletal Details: confirmed not currently requiring/ using assistive devices for ambulation        Psychosocial Psychosocial Symptoms Reported: No symptoms reported         There were no vitals filed for this visit.  Medications Reviewed Today     Reviewed by Keyla Milone M, RN (Registered Nurse) on 05/29/24 at [phone]  Med List Status: <None>   Medication Order Taking? Sig Documenting Provider Last Dose Status Informant  acetaminophen  (TYLENOL ) 500 MG tablet [id number]  Take 1,000 mg by mouth every 6 (six) hours as needed for moderate pain. [provider]  Active Self  allopurinol  (ZYLOPRIM ) 100 MG tablet [id number]  Take 1 tablet (100 mg total) by mouth daily. Romayne Clubs, PA-C  Active Self  amLODipine  (NORVASC ) 5 MG tablet [id number]  Take 1.5 tablets (7.5 mg total) by mouth daily. Millicent Ally, MD  Active Self  apixaban  (ELIQUIS ) 5 MG TABS tablet [id number]  Take 1 tablet by mouth twice daily Millicent Ally, MD  Active Self  COMIRNATY syringe [id number]   [provider]  Active Self  cyanocobalamin  (VITAMIN B12) 1000 MCG tablet [id number]  Take 1,000 mcg by mouth daily. [provider]  Active Self  dapagliflozin propanediol (FARXIGA) 10 MG TABS tablet [id number]  Take 10 mg by mouth in the morning. [provider]  Active Self  dofetilide  (TIKOSYN ) 250 MCG capsule [id number]  Take 1 capsule (250 mcg total) by mouth 2 (two) times daily. Fenton, Clint R, PA  Active   ezetimibe  (ZETIA ) 10 MG tablet [id number]  Take 1 tablet (10 mg total) by mouth daily. Millicent Ally, MD  Active Self  FLUAD 0.5 ML injection [id number]   [provider]  Active Self  furosemide  (LASIX ) 20 MG tablet [id number]  Take 20 mg by mouth daily as needed for edema. [provider]  Active Self  isosorbide  dinitrate (ISORDIL ) 5 MG tablet [id number]   Take 1 tablet by mouth twice daily Camnitz, Will Gaylyn Keas, MD  Active Self  levothyroxine  (SYNTHROID ) 112 MCG tablet [id number]  Take 112 mcg by mouth daily before breakfast. [provider]  Active Self  loratadine  (CLARITIN ) 10 MG tablet [id number]  Take 1 tablet (10 mg total) by mouth daily. Henson, Vickie L, NP-C  Active Self  Multiple Vitamins-Minerals (OCUVITE ADULT 50+ PO) [id number]  Take 1 tablet by mouth daily. [provider]  Active Self  olmesartan  (BENICAR ) 40 MG tablet [id number]  Take 1 tablet (40 mg total) by mouth daily. Lei Pump, MD  Active Self  rosuvastatin  (CRESTOR ) 40 MG tablet [id number]  Take 1 tablet by mouth once daily Millicent Ally, MD  Active   SYNTHROID  100 MCG tablet [id number]  Take 1 tablet (100 mcg total) by mouth daily before breakfast. Shamleffer, Ibtehal Jaralla, MD  Active Self           Recommendation:   Continue Current Plan of Care  Follow Up Plan:   Closing From:  Transitions of Care Program  [facility] 30--day outreach case closure:  patient declines further outreach, states she is doing great    Pls call/ message for questions,  Anthoni Geerts Mckinney Alvar Malinoski, RN, BSN, CCRN Alumnus RN Care Manager  Transitions of Care  [facility] - [facility] [phone]: direct office

## 2024-06-08 ENCOUNTER — Other Ambulatory Visit: Payer: Self-pay | Admitting: Cardiovascular Disease

## 2024-06-08 DIAGNOSIS — I4819 Other persistent atrial fibrillation: Secondary | ICD-10-CM

## 2024-06-08 NOTE — Telephone Encounter (Signed)
 Prescription refill request for Eliquis  received. Indication:afib Last office visit:6/25 Scr:1.12  6/25 Age: 78 Weight:90.5  kg  Prescription refilled

## 2024-06-22 ENCOUNTER — Ambulatory Visit: Admitting: Physician Assistant

## 2024-06-29 ENCOUNTER — Ambulatory Visit (HOSPITAL_COMMUNITY)
Admission: RE | Admit: 2024-06-29 | Discharge: 2024-06-29 | Disposition: A | Discharge status: Home / Self Care | Source: Ambulatory Visit | Attending: Physician Assistant | Admitting: Physician Assistant

## 2024-06-29 VITALS — BP 134/80 | HR 74 | Ht 65.0 in | Wt 202.4 lb

## 2024-06-29 DIAGNOSIS — I4819 Other persistent atrial fibrillation: Secondary | ICD-10-CM | POA: Diagnosis not present

## 2024-06-29 DIAGNOSIS — Z5181 Encounter for therapeutic drug level monitoring: Secondary | ICD-10-CM | POA: Diagnosis not present

## 2024-06-29 DIAGNOSIS — Z79899 Other long term (current) drug therapy: Secondary | ICD-10-CM | POA: Diagnosis not present

## 2024-06-29 DIAGNOSIS — I4891 Unspecified atrial fibrillation: Secondary | ICD-10-CM | POA: Diagnosis not present

## 2024-06-29 DIAGNOSIS — D6869 Other thrombophilia: Secondary | ICD-10-CM

## 2024-06-29 NOTE — Progress Notes (Signed)
 Primary Care Physician: Lendia Boby CROME, NP-C Referring Physician: Dr. Inocencio Lauraine KANDICE Claudia Cantrell is a 78 y.o. female with a h/o atrial fibrillation, CAD, OSA, HTN who presents for follow up in the [facility]. Patient is s/p afib ablation with Dr Inocencio on 07/28/21. She had done well with no know episodes of afib until she presented to the ED on 03/24/22 with chest pain. She tested positive for COVID at that time. ECG showed rate controlled afib. Seen by Dr Burnard on 05/15/22 and she was still in afib, her carvedilol  was increased.   Patient is s/p DCCV 05/31/22 which was unsuccessful after 4 shocks. She has symptoms of fatigue and exercise intolerance when in afib. She was started on amiodarone  as a bridge to possible repeat ablation. S/p DCCV on 07/18/22.  Patient is s/p afib ablation with Dr Inocencio on 12/14/22. She had recurrence of her afib and was seen by Dr Inocencio on 04/07/24 who recommended dofetilide . She is s/p dofetilide  loading 6/9-6/12/25. She converted with the medication and did not require DCCV. Carvedilol  was discontinued due to bradycardia.   Patient returns for follow up for atrial fibrillation and dofetilide  monitoring. She reports that she has done well from an afib standpoint since her last visit with no interim episodes. No bleeding issues on anticoagulation. She does have some mild symptoms of frequency and dysuria. She plans to follow up with her PCP.   Today, she  denies symptoms of palpitations, chest pain, shortness of breath, orthopnea, PND, lower extremity edema, dizziness, presyncope, syncope, bleeding, or neurologic sequela. The patient is tolerating medications without difficulties and is otherwise without complaint today.    Past Medical History:  Diagnosis Date   Arthritis    rheumatoid   Back pain    CAD (coronary artery disease)    a. 1992 s/p MI and PTCA of unknown vessel;  b. 09/2010 Cath: LM nl, LAD 20p, LCX 22m, RCA 43m, RPL 20.    Cancer [facility])    Cervical cancer (HCC) 1977   Chest pain    Chronic kidney disease    Constipation    Family history of anesthesia complication    daughter has difficulty waking    GERD (gastroesophageal reflux disease)    occ   Gout 10/08/2016   History of heart attack    Hyperlipidemia    Hypertensive heart disease    Hypothyroidism    Joint pain    Nonischemic cardiomyopathy (HCC)    a. 07/2016 Echo: EF 40-45%, mild LVH, inferior akinesis, moderately dilated left atrium, trivial AI and MR.   PAF (paroxysmal atrial fibrillation) (HCC)    a. 07/2016 Admitted w/ AF RVR-->CHA2DS2VASc = 5-->Eliquis ;  b. 07/2016 successful TEE/DCCV.   Pre-diabetes    Sleep apnea    a. Using CPAP.   SOB (shortness of breath)    Vitamin D  deficiency     Current Outpatient Medications  Medication Sig Dispense Refill   acetaminophen  (TYLENOL ) 500 MG tablet Take 1,000 mg by mouth every 6 (six) hours as needed for moderate pain. (Patient taking differently: Take 1,000 mg by mouth as needed for moderate pain (pain score 4-6).)     allopurinol  (ZYLOPRIM ) 100 MG tablet Take 1 tablet (100 mg total) by mouth daily. 30 tablet 2   amLODipine  (NORVASC ) 5 MG tablet Take 1.5 tablets (7.5 mg total) by mouth daily. 45 tablet 1   apixaban  (ELIQUIS ) 5 MG TABS tablet Take 1 tablet by mouth twice daily 180  tablet 1   cyanocobalamin  (VITAMIN B12) 1000 MCG tablet Take 1,000 mcg by mouth daily.     dapagliflozin propanediol (FARXIGA) 10 MG TABS tablet Take 10 mg by mouth in the morning.     dofetilide  (TIKOSYN ) 250 MCG capsule Take 1 capsule (250 mcg total) by mouth 2 (two) times daily. 180 capsule 1   ezetimibe  (ZETIA ) 10 MG tablet Take 1 tablet (10 mg total) by mouth daily. 90 tablet 3   furosemide  (LASIX ) 20 MG tablet Take 20 mg by mouth daily as needed for edema. (Patient taking differently: Take 20 mg by mouth as needed for edema.)     isosorbide  dinitrate (ISORDIL ) 5 MG tablet Take 1 tablet by mouth twice daily 180 tablet  3   levothyroxine  (SYNTHROID ) 112 MCG tablet Take 112 mcg by mouth daily before breakfast.     loratadine  (CLARITIN ) 10 MG tablet Take 1 tablet (10 mg total) by mouth daily. (Patient taking differently: Take 10 mg by mouth as needed.) 30 tablet 0   Multiple Vitamins-Minerals (OCUVITE ADULT 50+ PO) Take 1 tablet by mouth daily.     olmesartan  (BENICAR ) 40 MG tablet Take 1 tablet (40 mg total) by mouth daily. 90 tablet 3   rosuvastatin  (CRESTOR ) 40 MG tablet Take 1 tablet by mouth once daily 90 tablet 3   No current facility-administered medications for this encounter.    ROS- All systems are reviewed and negative except as per the HPI above  Physical Exam: Vitals:   06/29/24 1422  Pulse: 74  Weight: 91.8 kg  Height: 5' 5 (1.651 m)     Wt Readings from Last 3 Encounters:  06/29/24 91.8 kg  05/28/24 90.5 kg  05/18/24 92.4 kg    GEN: Well nourished, well developed in no acute distress CARDIAC: Regular rate and rhythm, no murmurs, rubs, gallops RESPIRATORY:  Clear to auscultation without rales, wheezing or rhonchi  ABDOMEN: Soft, non-tender, non-distended EXTREMITIES:  No edema; No deformity    EKG today demonstrates SR Vent. rate 74 BPM PR interval 146 ms QRS duration 88 ms QT/QTcB 426/472 ms   Epic records reviewed    CHA2DS2-VASc Score = 7  The patient's score is based upon: CHF History: 1 HTN History: 1 Diabetes History: 1 Stroke History: 0 Vascular Disease History: 1 Age Score: 2 Gender Score: 1       ASSESSMENT AND PLAN: Persistent Atrial Fibrillation (ICD10:  I48.19) The patient's CHA2DS2-VASc score is 7, indicating a 11.2% annual risk of stroke.   S/p afib ablation 07/28/21 and 12/14/22 S/p dofetilide  loading 05/2024 Continue dofetilide  250 mcg BID Continue Eliquis  5 mg BID  Secondary Hypercoagulable State (ICD10:  D68.69) The patient is at significant risk for stroke/thromboembolism based upon her CHA2DS2-VASc Score of 7.  Continue Apixaban   (Eliquis ). No bleeding issues.   High Risk Medication Monitoring (ICD 10: Z79.899) QT interval on ECG acceptable for dofetilide  monitoring. Check bmet/mag today.     HTN Stable on current regimen  CAD No anginal symptoms  OSA  Encouraged nightly CPAP   Follow up in the AF clinic in 3 months.    Daril Kicks PA-C Afib Clinic [facility] [address] [place], [place] [address] [phone]

## 2024-06-30 ENCOUNTER — Ambulatory Visit (HOSPITAL_COMMUNITY): Payer: Self-pay | Admitting: Physician Assistant

## 2024-06-30 LAB — BASIC METABOLIC PANEL WITH GFR
BUN/Creatinine Ratio: 15 (ref 12–28)
BUN: 15 mg/dL (ref 8–27)
CO2: 22 mmol/L (ref 20–29)
Calcium: 10 mg/dL (ref 8.7–10.3)
Chloride: 105 mmol/L (ref 96–106)
Creatinine, Ser: 1.02 mg/dL — ABNORMAL HIGH (ref 0.57–1.00)
Glucose: 99 mg/dL (ref 70–99)
Potassium: 4 mmol/L (ref 3.5–5.2)
Sodium: 142 mmol/L (ref 134–144)
eGFR: 57 mL/min/1.73 — ABNORMAL LOW (ref >59)

## 2024-06-30 LAB — MAGNESIUM: Magnesium: 2.4 mg/dL — ABNORMAL HIGH (ref 1.6–2.3)

## 2024-07-01 ENCOUNTER — Ambulatory Visit: Admitting: Family Medicine

## 2024-07-01 ENCOUNTER — Encounter: Payer: Self-pay | Admitting: Family Medicine

## 2024-07-01 VITALS — BP 122/84 | HR 70 | Temp 97.6°F | Ht 65.0 in | Wt 204.0 lb

## 2024-07-01 DIAGNOSIS — R350 Frequency of micturition: Secondary | ICD-10-CM

## 2024-07-01 DIAGNOSIS — N3001 Acute cystitis with hematuria: Secondary | ICD-10-CM | POA: Diagnosis not present

## 2024-07-01 DIAGNOSIS — R3915 Urgency of urination: Secondary | ICD-10-CM

## 2024-07-01 LAB — POC URINALSYSI DIPSTICK (AUTOMATED)
Bilirubin, UA: NEGATIVE
Glucose, UA: POSITIVE — AB
Ketones, UA: NEGATIVE
Nitrite, UA: NEGATIVE
Protein, UA: POSITIVE — AB
Spec Grav, UA: 1.015 (ref 1.010–1.025)
Urobilinogen, UA: 0.2 U/dL
pH, UA: 6 (ref 5.0–8.0)

## 2024-07-01 MED ORDER — CEPHALEXIN 500 MG PO CAPS
500.0000 mg | ORAL_CAPSULE | Freq: Two times a day (BID) | ORAL | 0 refills | Status: DC
Start: 2024-07-01 — End: 2024-08-28

## 2024-07-01 NOTE — Patient Instructions (Signed)
 Please take the antibiotic as prescribed.  Stay hydrated.  Let us  know if you are not feeling better or if you have any new or worsening symptoms.

## 2024-07-01 NOTE — Progress Notes (Signed)
 Subjective:  Claudia Cantrell is a 78 y.o. female who complains of possible urinary tract infection.  She has had symptoms for 2 weeks.  Symptoms include dysuria. Patient denies fever, chills, abdominal pain, back pain, and N/V.  Last UTI was years ago.      Past Medical History:  Diagnosis Date   Arthritis    rheumatoid   Back pain    CAD (coronary artery disease)    a. 1992 s/p MI and PTCA of unknown vessel;  b. 09/2010 Cath: LM nl, LAD 20p, LCX 3m, RCA 1m, RPL 20.   Cancer [facility])    Cervical cancer (HCC) 1977   Chest pain    Chronic kidney disease    Constipation    Family history of anesthesia complication    daughter has difficulty waking    GERD (gastroesophageal reflux disease)    occ   Gout 10/08/2016   History of heart attack    Hyperlipidemia    Hypertensive heart disease    Hypothyroidism    Joint pain    Nonischemic cardiomyopathy (HCC)    a. 07/2016 Echo: EF 40-45%, mild LVH, inferior akinesis, moderately dilated left atrium, trivial AI and MR.   PAF (paroxysmal atrial fibrillation) (HCC)    a. 07/2016 Admitted w/ AF RVR-->CHA2DS2VASc = 5-->Eliquis ;  b. 07/2016 successful TEE/DCCV.   Pre-diabetes    Sleep apnea    a. Using CPAP.   SOB (shortness of breath)    Vitamin D  deficiency     ROS as in subjective  Reviewed allergies, medications, past medical, surgical, and social history.    Objective: Vitals:   07/01/24 1631  BP: 122/84  Pulse: 70  Temp: 97.6 F (36.4 C)  SpO2: 98%    General appearance: alert, no distress, WD/WN, female Abdomen: +bs, soft, non tender, non distended Back: no CVA tenderness      Assessment: Acute cystitis with hematuria  Urinary urgency - Plan: POCT Urinalysis Dipstick (Automated)  Urinary frequency - Plan: POCT Urinalysis Dipstick (Automated)   Plan: Discussed symptoms, diagnosis, possible complications, and usual course of illness.  UA indicative of acute cystitis   Keflex  prescribed.  Advised increased water  intake, can use OTC Tylenol  for pain.     Urine culture not sent due to lab being closed.    Call or return if worse or not improving.

## 2024-07-08 ENCOUNTER — Telehealth: Payer: Self-pay

## 2024-07-08 DIAGNOSIS — C49A0 Gastrointestinal stromal tumor, unspecified site: Secondary | ICD-10-CM

## 2024-07-08 NOTE — Telephone Encounter (Signed)
-----   Message from Nurse Yael Coppess P sent at 07/09/2023  4:55 PM EDT ----- 1 year CT abdomen pelvis with clinic visit to follow. Thanks.

## 2024-07-08 NOTE — Telephone Encounter (Signed)
 The pt has been advised of the need for CT and office visit. She agrees and CT order entered and sent to the schedulers.  Appt made for 9/10 at 930 am with GM.

## 2024-07-15 ENCOUNTER — Ambulatory Visit (HOSPITAL_COMMUNITY)
Admission: RE | Admit: 2024-07-15 | Discharge: 2024-07-15 | Disposition: A | Discharge status: Home / Self Care | Source: Ambulatory Visit | Attending: Gastroenterology | Admitting: Gastroenterology

## 2024-07-15 DIAGNOSIS — C49A0 Gastrointestinal stromal tumor, unspecified site: Secondary | ICD-10-CM | POA: Insufficient documentation

## 2024-07-15 MED ORDER — IOHEXOL 300 MG/ML  SOLN
100.0000 mL | Freq: Once | INTRAMUSCULAR | Status: AC | PRN
  Administered 2024-07-15: 100 mL via INTRAVENOUS

## 2024-07-28 ENCOUNTER — Ambulatory Visit: Payer: Self-pay | Admitting: Gastroenterology

## 2024-07-31 NOTE — Telephone Encounter (Signed)
 I have spoken to patient to advise that Dr Wilhelmenia has reviewed her CT and feels nodule is stable in comparison to last EUS. Advised we will repeat EUS in 1 year and may extend surveillance further out if still stable at that time. She verbalizes understanding.

## 2024-08-13 ENCOUNTER — Telehealth: Payer: Self-pay | Admitting: Cardiology

## 2024-08-13 NOTE — Telephone Encounter (Signed)
 Called and informed pt cardiology refills her heart and bp meds, please reach out to them to refill this. Pt voiced understanding

## 2024-08-13 NOTE — Telephone Encounter (Signed)
*  STAT* If patient is at the pharmacy, call can be transferred to refill team.   1. Which medications need to be refilled? (please list name of each medication and dose if known) amLODipine  (NORVASC ) 5 MG tablet    2. Would you like to learn more about the convenience, safety, & potential cost savings by using the [facility]?      3. Are you open to using the [facility] (Type [facility]. ).   4. Which pharmacy/location (including street and city if local pharmacy) is medication to be sent to? [facility] [address] [place], [place] - [address]    5. Do they need a 30 day or 90 day supply? 90 day

## 2024-08-18 ENCOUNTER — Other Ambulatory Visit: Payer: Self-pay

## 2024-08-19 ENCOUNTER — Other Ambulatory Visit: Payer: Self-pay

## 2024-08-19 ENCOUNTER — Encounter: Payer: Self-pay | Admitting: Gastroenterology

## 2024-08-19 ENCOUNTER — Ambulatory Visit: Admitting: Gastroenterology

## 2024-08-19 ENCOUNTER — Telehealth: Payer: Self-pay | Admitting: Cardiology

## 2024-08-19 VITALS — BP 136/86 | HR 67 | Ht 65.0 in | Wt 203.1 lb

## 2024-08-19 DIAGNOSIS — Z860101 Personal history of adenomatous and serrated colon polyps: Secondary | ICD-10-CM | POA: Diagnosis not present

## 2024-08-19 DIAGNOSIS — C49A0 Gastrointestinal stromal tumor, unspecified site: Secondary | ICD-10-CM

## 2024-08-19 MED ORDER — AMLODIPINE BESYLATE 5 MG PO TABS: 7.5000 mg | ORAL_TABLET | Freq: Every day | ORAL | 3 refills | Status: AC

## 2024-08-19 NOTE — Progress Notes (Signed)
 GASTROENTEROLOGY OUTPATIENT CLINIC VISIT   Primary Care Provider Lendia Boby CROME, NP-C [address] [place] [address] [phone]  Patient Profile: Claudia Cantrell is a 78 y.o. female with a pmh significant for paroxysmal atrial fibrillation (on anticoagulation), rheumatoid arthritis, ? 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  The patient presents to the [facility] for an evaluation and management of problem(s) noted below:  Problem List 1. Gastrointestinal stromal tumor (GIST) (HCC)   2. Hx of adenomatous colonic polyps     Discussed the use of AI scribe software for clinical note transcription with the patient, who gave verbal consent to proceed.  History of Present Illness Please see prior notes for full details of HPI.  Interval History Claudia Cantrell is a 78 year old female with a gastric gastrointestinal stromal tumor (GIST) who presents for follow-up.  Overall, she has been doing well.  No issues from a GI standpoint in regard to changes in bowel habits or new pain or GERD symptoms.  She has not noted any blood in her stools.  We reviewed the recent CTA performed for follow-up of her GIST and no significant change in the size of her GIST.  She was on Ozempic  recently and had weight loss but lost coverage through her PCP/Nephrologist.  She is going to work with them to see if she can get on it again.  as she is concerned about regaining weight since stopping the medication.  No blood in stools or changes in bowel habits, even while on Ozempic .  She is currently taking Tikosyn  for her heart condition but has by accident used the medication at a higher dose a few days in the last few weeks (2 pills twice daily when it is prescribed as 1 pill twice daily) but she has not had any effects from this.   GI Review of Systems Positive  as above Negative for dysphagia, odynophagia, nausea, vomiting, pain, melena, hematochezia  Review of Systems General: Denies fevers/chills Cardiovascular: Denies chest pain Pulmonary: Denies shortness of breath Gastroenterological: See HPI Genitourinary: Denies darkened urine Hematological: Positive for easy bruising/bleeding as result of anticoagulation Dermatological: Denies jaundice Psychological: Mood is stable   Medications Current Outpatient Medications  Medication Sig Dispense Refill   acetaminophen  (TYLENOL ) 500 MG tablet Take 1,000 mg by mouth every 6 (six) hours as needed for moderate pain.     allopurinol  (ZYLOPRIM ) 100 MG tablet Take 1 tablet (100 mg total) by mouth daily. 30 tablet 2   amLODipine  (NORVASC ) 5 MG tablet Take 1.5 tablets (7.5 mg total) by mouth daily. 45 tablet 1   apixaban  (ELIQUIS ) 5 MG TABS tablet Take 1 tablet by mouth twice daily 180 tablet 1   cephALEXin  (KEFLEX ) 500 MG capsule Take 1 capsule (500 mg total) by mouth 2 (two) times daily. 10 capsule 0   cyanocobalamin  (VITAMIN B12) 1000 MCG tablet Take 1,000 mcg by mouth daily.     dapagliflozin propanediol (FARXIGA) 10 MG TABS tablet Take 10 mg by mouth in the morning.     dofetilide  (TIKOSYN ) 250 MCG capsule Take 1 capsule (250 mcg total) by mouth 2 (two) times daily. 180 capsule 1   ezetimibe  (ZETIA ) 10 MG tablet Take 1 tablet (10 mg total) by mouth daily. 90 tablet 3   furosemide  (LASIX ) 20 MG tablet Take 20 mg by mouth daily as needed for edema.     isosorbide  dinitrate (ISORDIL ) 5 MG tablet  Take 1 tablet by mouth twice daily 180 tablet 3   levothyroxine  (SYNTHROID ) 112 MCG tablet Take 112 mcg by mouth daily before breakfast.     loratadine  (CLARITIN ) 10 MG tablet Take 1 tablet (10 mg total) by mouth daily. (Patient taking differently: Take 10 mg by mouth as needed for allergies.) 30 tablet 0   Multiple Vitamins-Minerals (OCUVITE ADULT 50+ PO) Take 1 tablet by mouth daily.     olmesartan  (BENICAR )  40 MG tablet Take 1 tablet (40 mg total) by mouth daily. 90 tablet 3   rosuvastatin  (CRESTOR ) 40 MG tablet Take 1 tablet by mouth once daily 90 tablet 3   No current facility-administered medications for this visit.    Allergies Allergies  Allergen Reactions   Labetalol     headaches   Codeine Anxiety    Histories Past Medical History:  Diagnosis Date   Arthritis    rheumatoid   Back pain    CAD (coronary artery disease)    a. 1992 s/p MI and PTCA of unknown vessel;  b. 09/2010 Cath: LM nl, LAD 20p, LCX 22m, RCA 35m, RPL 20.   Cancer [facility])    Cervical cancer (HCC) 1977   Chest pain    Chronic kidney disease    Constipation    Family history of anesthesia complication    daughter has difficulty waking    GERD (gastroesophageal reflux disease)    occ   Gout 10/08/2016   History of heart attack    Hyperlipidemia    Hypertensive heart disease    Hypothyroidism    Joint pain    Nonischemic cardiomyopathy (HCC)    a. 07/2016 Echo: EF 40-45%, mild LVH, inferior akinesis, moderately dilated left atrium, trivial AI and MR.   PAF (paroxysmal atrial fibrillation) (HCC)    a. 07/2016 Admitted w/ AF RVR-->CHA2DS2VASc = 5-->Eliquis ;  b. 07/2016 successful TEE/DCCV.   Pre-diabetes    Sleep apnea    a. Using CPAP.   SOB (shortness of breath)    Vitamin D  deficiency    Past Surgical History:  Procedure Laterality Date   ABDOMINAL HYSTERECTOMY  12/10/1986   ATRIAL FIBRILLATION ABLATION N/A 07/28/2021   Procedure: ATRIAL FIBRILLATION ABLATION;  Surgeon: Inocencio Soyla Lunger, MD;  Location: [facility];  Service: Cardiovascular;  Laterality: N/A;   ATRIAL FIBRILLATION ABLATION N/A 12/14/2022   Procedure: ATRIAL FIBRILLATION ABLATION;  Surgeon: Inocencio Soyla Lunger, MD;  [facility]: [facility];  Service: Cardiovascular;  Laterality: N/A;   BACK SURGERY  12/10/1992   BIOPSY  09/02/2019   Procedure: BIOPSY;  Surgeon: Wilhelmenia Aloha Raddle., MD;  Location: [facility];   Service: Gastroenterology;;   BIOPSY  12/09/2019   Procedure: BIOPSY;  Surgeon: Wilhelmenia Aloha Raddle., MD;  Location: [facility];  Service: Gastroenterology;;   BIOPSY  03/08/2021   Procedure: BIOPSY;  Surgeon: Wilhelmenia Aloha Raddle., MD;  Location: [facility];  Service: Gastroenterology;;   BIOPSY  07/08/2023   Procedure: BIOPSY;  Surgeon: Wilhelmenia Aloha Raddle., MD;  Location: [facility];  Service: Gastroenterology;;   CARDIAC CATHETERIZATION  1991 AND 1992   PTCA BY DR CARLIN FORGET   CARDIOVERSION N/A 08/01/2016   Procedure: CARDIOVERSION;  Surgeon: Redell GORMAN Shallow, MD;  Location: [facility];  Service: Cardiovascular;  Laterality: N/A;   CARDIOVERSION N/A 01/11/2020   Procedure: CARDIOVERSION;  Surgeon: Delford Maude BROCKS, MD;  Location: [facility] ENDOSCOPY;  Service: Cardiovascular;  Laterality: N/A;   CARDIOVERSION N/A 07/08/2020   Procedure: CARDIOVERSION;  Surgeon: Kate Bruckner  L, MD;  Location: [facility];  Service: Cardiovascular;  Laterality: N/A;   CARDIOVERSION N/A 05/31/2022   Procedure: CARDIOVERSION;  Surgeon: Mona Vinie BROCKS, MD;  Location: [facility];  Service: Cardiovascular;  Laterality: N/A;   CARDIOVERSION N/A 07/18/2022   Procedure: CARDIOVERSION;  Surgeon: Kate Lonni CROME, MD;  Location: [facility] END[facility]COPY;  Service: Cardiovascular;  Laterality: N/A;   CATARACT EXTRACTION  2024   June/July 2024 bilateral eyes   COLONOSCOPY  07/2023   ENDOSCOPIC MUCOSAL RESECTION N/A 12/09/2019   Procedure: ENDOSCOPIC MUCOSAL RESECTION;  Surgeon: Wilhelmenia Aloha Raddle., MD;  Location: [facility];  Service: Gastroenterology;  Laterality: N/A;   ESOPHAGOGASTRODUODENOSCOPY N/A 03/08/2021   Procedure: ESOPHAGOGASTRODUODENOSCOPY (EGD);  Surgeon: Wilhelmenia Aloha Raddle., MD;  Location: [facility];  Service: Gastroenterology;  Laterality: N/A;   ESOPHAGOGASTRODUODENOSCOPY (EGD) WITH PROPOFOL  N/A 09/02/2019   Procedure: ESOPHAGOGASTRODUODENOSCOPY (EGD) WITH  PROPOFOL ;  Surgeon: Wilhelmenia Aloha Raddle., MD;  Location: [facility];  Service: Gastroenterology;  Laterality: N/A;   ESOPHAGOGASTRODUODENOSCOPY (EGD) WITH PROPOFOL  N/A 12/09/2019   Procedure: ESOPHAGOGASTRODUODENOSCOPY (EGD) WITH PROPOFOL ;  Surgeon: Wilhelmenia Aloha Raddle., MD;  Location: [facility];  Service: Gastroenterology;  Laterality: N/A;   ESOPHAGOGASTRODUODENOSCOPY (EGD) WITH PROPOFOL  N/A 07/08/2023   Procedure: ESOPHAGOGASTRODUODENOSCOPY (EGD) WITH PROPOFOL ;  Surgeon: Wilhelmenia Aloha Raddle., MD;  Location: [facility];  Service: Gastroenterology;  Laterality: N/A;   EUS N/A 09/02/2019   Procedure: UPPER ENDOSCOPIC ULTRASOUND (EUS) RADIAL;  Surgeon: Wilhelmenia Aloha Raddle., MD;  Location: [facility];  Service: Gastroenterology;  Laterality: N/A;  EUS radial/linear   EUS N/A 12/09/2019   Procedure: UPPER ENDOSCOPIC ULTRASOUND (EUS) RADIAL;  Surgeon: Wilhelmenia Aloha Raddle., MD;  Location: [facility];  Service: Gastroenterology;  Laterality: N/A;   EUS  12/09/2019   Procedure: UPPER ENDOSCOPIC ULTRASOUND (EUS) LINEAR;  Surgeon: Wilhelmenia Aloha Raddle., MD;  Location: [facility];  Service: Gastroenterology;;   EUS N/A 03/08/2021   Procedure: UPPER ENDOSCOPIC ULTRASOUND (EUS) RADIAL;  Surgeon: Wilhelmenia Aloha Raddle., MD;  Location: [facility];  Service: Gastroenterology;  Laterality: N/A;   FINE NEEDLE ASPIRATION  09/02/2019   Procedure: FINE NEEDLE ASPIRATION (FNA) LINEAR;  Surgeon: Wilhelmenia Aloha Raddle., MD;  Location: [facility];  Service: Gastroenterology;;   FINE NEEDLE ASPIRATION  12/09/2019   Procedure: FINE NEEDLE ASPIRATION (FNA) LINEAR;  Surgeon: Wilhelmenia Aloha Raddle., MD;  Location: [facility];  Service: Gastroenterology;;   HEMOSTASIS CLIP PLACEMENT  12/09/2019   Procedure: HEMOSTASIS CLIP PLACEMENT;  Surgeon: Wilhelmenia Aloha Raddle., MD;  Location: [facility];  Service: Gastroenterology;;   ORIF ANKLE FRACTURE Right 04/27/2015   Procedure: OPEN  REDUCTION INTERNAL FIXATION (ORIF) RIGHT BIMALLEOLAR ANKLE FRACTURE WITH SYNDESMOSIS FIXATION;  Surgeon: Kay CHRISTELLA Cummins, MD;  Location: [facility];  Service: Orthopedics;  Laterality: Right;   SUBMUCOSAL LIFTING INJECTION  12/09/2019   Procedure: SUBMUCOSAL LIFTING INJECTION;  Surgeon: Wilhelmenia Aloha Raddle., MD;  Location: [facility];  Service: Gastroenterology;;   TEE WITHOUT CARDIOVERSION N/A 08/01/2016   Procedure: TRANSESOPHAGEAL ECHOCARDIOGRAM (TEE);  Surgeon: Redell GORMAN Shallow, MD;  Location: [facility];  Service: Cardiovascular;  Laterality: N/A;   THYROIDECTOMY  11/24/2013   DR GAIL   THYROIDECTOMY N/A 11/24/2013   Procedure: TOTAL THYROIDECTOMY;  Surgeon: Elon CHRISTELLA GAIL, MD;  Location: [facility] OR;  Service: General;  Laterality: N/A;   TRANSTHORACIC ECHOCARDIOGRAM  01/15/2013   EF 55% TO 65%. PROBABLE MILD HYPOKINESIS OF THE INFERIOR MYOCARDIUM. GRADE 1 DIASTOLIC DYSFUNCTION. TRIAL AR.LA IS MILDLY DILATED.   UPPER ESOPHAGEAL ENDOSCOPIC ULTRASOUND (EUS) N/A 07/08/2023   Procedure: UPPER ESOPHAGEAL ENDOSCOPIC ULTRASOUND (EUS);  Surgeon: Wilhelmenia Aloha Raddle., MD;  Location: [facility];  Service: Gastroenterology;  Laterality: N/A;   Social History   Socioeconomic History   Marital status: Widowed    Spouse name: Not on file   Number of children: 3   Years of education: Not on file   Highest education level: Not on file  Occupational History   Occupation: retired  Tobacco Use   Smoking status: Former    Current packs/day: 0.00    Average packs/day: 1 pack/day for 15.0 years (15.0 ttl pk-yrs)    Types: Cigarettes    Start date: 12/11/1975    Quit date: 12/10/1990    Years since quitting: 33.7    Passive exposure: Never   Smokeless tobacco: Never   Tobacco comments:    Former smoker 05/21/22  Vaping Use   Vaping status: Never Used  Substance and Sexual Activity   Alcohol use: Yes    Comment: very rare   Drug use: No   Sexual activity: Not Currently  Other Topics Concern    Not on file  Social History Narrative   Right handed    Living  alone.   Social Drivers of Corporate investment banker Strain: Low Risk  (04/28/2024)   Overall Financial Resource Strain (CARDIA)    Difficulty of Paying Living Expenses: Not very hard  Food Insecurity: No Food Insecurity (05/22/2024)   Hunger Vital Sign    Worried About Running Out of Food in the Last Year: Never true    Ran Out of Food in the Last Year: Never true  Transportation Needs: No Transportation Needs (05/22/2024)   PRAPARE - Administrator, Civil Service (Medical): No    Lack of Transportation (Non-Medical): No  Physical Activity: Inactive (04/28/2024)   Exercise Vital Sign    Days of Exercise per Week: 0 days    Minutes of Exercise per Session: 0 min  Stress: No Stress Concern Present (04/28/2024)   [employer] of Occupational Health - Occupational Stress Questionnaire    Feeling of Stress : Not at all  Social Connections: Moderately Integrated (05/18/2024)   Social Connection and Isolation Panel    Frequency of Communication with Friends and Family: Twice a week    Frequency of Social Gatherings with Friends and Family: Twice a week    Attends Religious Services: More than 4 times per year    Active Member of [employer] or Organizations: No    Attends Banker Meetings: 1 to 4 times per year    Marital Status: Widowed  Recent Concern: Social Connections - Socially Isolated (04/28/2024)   Social Connection and Isolation Panel    Frequency of Communication with Friends and Family: Twice a week    Frequency of Social Gatherings with Friends and Family: Twice a week    Attends Religious Services: Never    Database administrator or Organizations: No    Attends Banker Meetings: Never    Marital Status: Widowed  Intimate Partner Violence: Not At Risk (05/22/2024)   Humiliation, Afraid, Rape, and Kick questionnaire    Fear of Current or Ex-Partner: No    Emotionally Abused:  No    Physically Abused: No    Sexually Abused: No   Family History  Problem Relation Age of Onset   Heart disease Mother    Sudden death Mother    Diabetes Father    Stroke Father    Bone cancer Sister    Sudden death Brother  Melanoma Brother    Heart disease Brother    Colon cancer Neg Hx    Esophageal cancer Neg Hx    Inflammatory bowel disease Neg Hx    Liver disease Neg Hx    Pancreatic cancer Neg Hx    Rectal cancer Neg Hx    Stomach cancer Neg Hx    I have reviewed her medical, social, and family history in detail and updated the electronic medical record as necessary.    PHYSICAL EXAMINATION  BP 136/86   Pulse 67   Ht 5' 5 (1.651 m)   Wt 203 lb 2 oz (92.1 kg)   SpO2 96%   BMI 33.80 kg/m  Wt Readings from Last 3 Encounters:  08/19/24 203 lb 2 oz (92.1 kg)  07/01/24 204 lb (92.5 kg)  06/29/24 202 lb 6.4 oz (91.8 kg)  GEN: NAD, appears stated age, doesn't appear chronically ill PSYCH: Cooperative, without pressured speech EYE: Conjunctivae pink, sclerae anicteric ENT: MMM CV: Non-tachycardic RESP: No audible wheezing GI: Soft, protuberant, rounded, nontender, no rebound MSK/EXT: No significant lower extremity edema SKIN: No jaundice NEURO:  Alert & Oriented x 3, no focal deficits   REVIEW OF DATA  I reviewed the following data at the time of this encounter:  GI Procedures and Studies  October 2024 colonoscopy - Hemorrhoids found on digital rectal exam. - Stool in the entire examined colon -lavaged copiously for adequate visualization. - There was significant looping of the colon. - Two 3 to 5 mm polyps in the sigmoid colon and in the ascending colon, removed with a cold snare. Resected and retrieved. - Diverticulosis in the recto-sigmoid colon and in the sigmoid colon. - Non-bleeding non-thrombosed external and internal hemorrhoids.  Laboratory Studies  Reviewed in epic  Imaging Studies  August 2025 CTAP IMPRESSION: Stable 1.5 cm soft tissue  nodule along the ventral wall of the distal gastric body. No evidence of metastatic disease. Colonic diverticulosis, without radiographic evidence of diverticulitis. Small hiatal hernia.   ASSESSMENT/PLAN  Ms. Slavick is a 78 y.o. female.  The patient is seen today for evaluation and management of:  1. Gastrointestinal stromal tumor (GIST) (HCC)   2. Hx of adenomatous colonic polyps    Gastrointestinal stromal tumor (GIST) The patient is clinically and hemodynamically stable from a GI perspective.  The GIST is unchanged in size per imaging.  Plan will be for 1 year followup EUS.  If all is stable, we may consider increasing surveillance intervals to every 2 years.  Currently, it remains below the usual surgical intervention threshold of 2 cm.   - Schedule ultrasound for 2026 to monitor GIST stability. - Continue annual imaging surveillance until stability is confirmed for five years, then consider extending interval to every two years.     Will forward message to her Cardiology team for followup in regards to her Afib and accidental recent increased use of Tikosyn    No orders of the defined types were placed in this encounter.   New Prescriptions   No medications on file   Modified Medications   No medications on file    Planned Follow Up No follow-ups on file.   Total Time in Face-to-Face and in Coordination of Care for patient including independent/personal interpretation/review of prior testing, medical history, examination, medication adjustment, communicating results with the patient directly, and documentation within the EHR is 25 minutes.   Aloha Finner, MD [facility] Office # [phone]

## 2024-08-19 NOTE — Telephone Encounter (Signed)
-----   Message from Lonni Bolk sent at 08/19/2024  4:09 PM EDT ----- Spoke with Charlies and Daphne. Recommend holding dose tonight and coming in first thing tomorrow morning for EKG. Do not take morning dose before coming into office ----- Message ----- From: Vicci Sherran NOVAK, RN Sent: 08/19/2024  12:11 PM EDT To: [facility] Pharm[facility]  dofetilide  (TIKOSYN ) 250 MCG capsule - - - Patient reports she took 2 tabs this morning for a total of 500mg ; her normal dose is 250mg  BID.   She is looking advice on what to do next - should she skip this evenings dose?   Thank you - Sherran, RN

## 2024-08-19 NOTE — Telephone Encounter (Signed)
 Spoke with patient. Provided her advice as noted. Scheduled nurse visit 9/11 @ 0900. Advised on holding meds as directed.

## 2024-08-19 NOTE — Patient Instructions (Addendum)
 You will be due for a recall EUS in 02/2026. We will send you a reminder in the mail when it gets closer to that time.   You will need repeat CT scan in 07/2025. Office will contact you to schedule.   _______________________________________________________  If your blood pressure at your visit was 140/90 or greater, please contact your primary care physician to follow up on this.  _______________________________________________________  If you are age 76 or older, your body mass index should be between 23-30. Your Body mass index is 33.8 kg/m. If this is out of the aforementioned range listed, please consider follow up with your Primary Care Provider.  If you are age 46 or younger, your body mass index should be between 19-25. Your Body mass index is 33.8 kg/m. If this is out of the aformentioned range listed, please consider follow up with your Primary Care Provider.   ________________________________________________________  The [facility] providers would like to encourage you to use MYCHART to communicate with providers for non-urgent requests or questions.  Due to long hold times on the telephone, sending your provider a message by [facility] may be a faster and more efficient way to get a response.  Please allow 48 business hours for a response.  Please remember that this is for non-urgent requests.  _______________________________________________________  [facility] is using a team-based approach to care.  Your team is made up of your doctor and two to three APPS. Our APPS (Nurse Practitioners and Physician Assistants) work with your physician to ensure care continuity for you. They are fully qualified to address your health concerns and develop a treatment plan. They communicate directly with your gastroenterologist to care for you. Seeing the Advanced Practice Practitioners on your physician's team can help you by facilitating care more promptly, often allowing for earlier  appointments, access to diagnostic testing, procedures, and other specialty referrals.   Thank you for choosing me and [facility].  Dr. Wilhelmenia

## 2024-08-20 ENCOUNTER — Ambulatory Visit: Attending: Cardiology

## 2024-08-20 VITALS — BP 180/88 | HR 59 | Ht 65.0 in | Wt 205.0 lb

## 2024-08-20 DIAGNOSIS — I4891 Unspecified atrial fibrillation: Secondary | ICD-10-CM

## 2024-08-20 NOTE — Progress Notes (Signed)
   Nurse Visit   Date of Encounter: 08/20/2024 ID: Claudia Cantrell, DOB 1946/05/10, MRN [id number]  PCP:  Lendia Boby CROME, NP-C   [facility] Providers Cardiologist:  None Electrophysiologist:  Will Gladis Norton, MD      Visit Details   VS:  BP (!) 180/88   Pulse (!) 59   Ht 5' 5 (1.651 m)   Wt 205 lb (93 kg)   BMI 34.11 kg/m  , BMI Body mass index is 34.11 kg/m.  Wt Readings from Last 3 Encounters:  08/20/24 205 lb (93 kg)  08/19/24 203 lb 2 oz (92.1 kg)  07/01/24 204 lb (92.5 kg)     Reason for visit: EKG Performed today: Vitals, EKG, Provider consulted:Jodie Passey PA, and Education Changes (medications, testing, etc.) : Patient came in today due to taking her Tikosyn  twice yesterday morning. Patient was told to hold her evening dose yesterday and her morning dose today. Review patient's EKG with Jodie Passey PA he advised patient to take her morning dose of tikosyn  now (she had it with her and took it while in the office) and follow-up in A. FIB clinic ASAP. Made patient an appointment for next Friday. Patient's BP was elevated, but patient has not taken any of her medications this morning for her BP. Patient will take her medications as soon as she can. Length of Visit: 15 minutes    Medications Adjustments/Labs and Tests Ordered: Orders Placed This Encounter  Procedures   EKG 12-Lead   No orders of the defined types were placed in this encounter.    Signed, Sharlet Drena Martinis, RN  08/20/2024 9:59 AM

## 2024-08-20 NOTE — Patient Instructions (Signed)
 Medication Instructions:  Your physician recommends that you continue on your current medications as directed. Please refer to the Current Medication list given to you today.  *If you need a refill on your cardiac medications before your next appointment, please call your pharmacy*  Lab Work: If you have labs (blood work) drawn today and your tests are completely normal, you will receive your results only by: MyChart Message (if you have MyChart) OR A paper copy in the mail If you have any lab test that is abnormal or we need to change your treatment, we will call you to review the results.  Testing/Procedures: None ordered today.  Follow-Up: At [facility], you and your health needs are our priority.  As part of our continuing mission to provide you with exceptional heart care, our providers are all part of one team.  This team includes your primary Cardiologist (physician) and Advanced Practice Providers or APPs (Physician Assistants and Nurse Practitioners) who all work together to provide you with the care you need, when you need it.  Your next appointment:   As soon as possible  Provider:   [facility]  We recommend signing up for the patient portal called MyChart.  Sign up information is provided on this After Visit Summary.  MyChart is used to connect with patients for Virtual Visits (Telemedicine).  Patients are able to view lab/test results, encounter notes, upcoming appointments, etc.  Non-urgent messages can be sent to your provider as well.   To learn more about what you can do with MyChart, go to [email or web address].

## 2024-08-28 ENCOUNTER — Ambulatory Visit (HOSPITAL_COMMUNITY)
Admission: RE | Admit: 2024-08-28 | Discharge: 2024-08-28 | Disposition: A | Discharge status: Home / Self Care | Source: Ambulatory Visit | Attending: Physician Assistant | Admitting: Physician Assistant

## 2024-08-28 ENCOUNTER — Encounter (HOSPITAL_COMMUNITY): Payer: Self-pay | Admitting: Physician Assistant

## 2024-08-28 VITALS — BP 116/76 | HR 67 | Ht 65.0 in | Wt 205.6 lb

## 2024-08-28 DIAGNOSIS — Z5181 Encounter for therapeutic drug level monitoring: Secondary | ICD-10-CM | POA: Diagnosis not present

## 2024-08-28 DIAGNOSIS — D6869 Other thrombophilia: Secondary | ICD-10-CM | POA: Diagnosis not present

## 2024-08-28 DIAGNOSIS — I4819 Other persistent atrial fibrillation: Secondary | ICD-10-CM | POA: Diagnosis not present

## 2024-08-28 DIAGNOSIS — Z79899 Other long term (current) drug therapy: Secondary | ICD-10-CM | POA: Diagnosis not present

## 2024-08-28 NOTE — Patient Instructions (Signed)
 Zepbound (Tirzepatide)  Follow up with Dr Inocencio 3 months

## 2024-08-28 NOTE — Progress Notes (Signed)
 Primary Care Physician: Lendia Boby CROME, NP-C Referring Physician: Dr. Inocencio Lauraine KANDICE Claudia Cantrell is a 78 y.o. female with a h/o atrial fibrillation, CAD, OSA, HTN who presents for follow up in the [facility]. Patient is s/p afib ablation with Dr Inocencio on 07/28/21. She had done well with no know episodes of afib until she presented to the ED on 03/24/22 with chest pain. She tested positive for COVID at that time. ECG showed rate controlled afib. Seen by Dr Burnard on 05/15/22 and she was still in afib, her carvedilol  was increased.   Patient is s/p DCCV 05/31/22 which was unsuccessful after 4 shocks. She has symptoms of fatigue and exercise intolerance when in afib. She was started on amiodarone  as a bridge to possible repeat ablation. S/p DCCV on 07/18/22.  Patient is s/p afib ablation with Dr Inocencio on 12/14/22. She had recurrence of her afib and was seen by Dr Inocencio on 04/07/24 who recommended dofetilide . She is s/p dofetilide  loading 6/9-6/12/25. She converted with the medication and did not require DCCV. Carvedilol  was discontinued due to bradycardia.   Patient returns for follow up for atrial fibrillation and dofetilide  monitoring. Patient reports that she has done well since her last visit with no interim symptoms of afib. No bleeding issues on anticoagulation. She does report significant stress and anxiety about taking her medication on time.   Today, she  denies symptoms of palpitations, chest pain, shortness of breath, orthopnea, PND, lower extremity edema, dizziness, presyncope, syncope, bleeding, or neurologic sequela. The patient is tolerating medications without difficulties and is otherwise without complaint today.    Past Medical History:  Diagnosis Date   Arthritis    rheumatoid   Back pain    CAD (coronary artery disease)    a. 1992 s/p MI and PTCA of unknown vessel;  b. 09/2010 Cath: LM nl, LAD 20p, LCX 69m, RCA 87m, RPL 20.   Cancer [facility])    Cervical  cancer (HCC) 1977   Chest pain    Chronic kidney disease    Constipation    Family history of anesthesia complication    daughter has difficulty waking    GERD (gastroesophageal reflux disease)    occ   Gout 10/08/2016   History of heart attack    Hyperlipidemia    Hypertensive heart disease    Hypothyroidism    Joint pain    Nonischemic cardiomyopathy (HCC)    a. 07/2016 Echo: EF 40-45%, mild LVH, inferior akinesis, moderately dilated left atrium, trivial AI and MR.   PAF (paroxysmal atrial fibrillation) (HCC)    a. 07/2016 Admitted w/ AF RVR-->CHA2DS2VASc = 5-->Eliquis ;  b. 07/2016 successful TEE/DCCV.   Pre-diabetes    Sleep apnea    a. Using CPAP.   SOB (shortness of breath)    Vitamin D  deficiency     Current Outpatient Medications  Medication Sig Dispense Refill   acetaminophen  (TYLENOL ) 500 MG tablet Take 1,000 mg by mouth every 6 (six) hours as needed for moderate pain.     allopurinol  (ZYLOPRIM ) 100 MG tablet Take 1 tablet (100 mg total) by mouth daily. 30 tablet 2   amLODipine  (NORVASC ) 5 MG tablet Take 1.5 tablets (7.5 mg total) by mouth daily. 45 tablet 3   apixaban  (ELIQUIS ) 5 MG TABS tablet Take 1 tablet by mouth twice daily 180 tablet 1   cyanocobalamin  (VITAMIN B12) 1000 MCG tablet Take 1,000 mcg by mouth daily.     dapagliflozin propanediol (FARXIGA)  10 MG TABS tablet Take 10 mg by mouth in the morning.     dofetilide  (TIKOSYN ) 250 MCG capsule Take 1 capsule (250 mcg total) by mouth 2 (two) times daily. 180 capsule 1   ezetimibe  (ZETIA ) 10 MG tablet Take 1 tablet (10 mg total) by mouth daily. 90 tablet 3   furosemide  (LASIX ) 20 MG tablet Take 20 mg by mouth daily as needed for edema.     isosorbide  dinitrate (ISORDIL ) 5 MG tablet Take 1 tablet by mouth twice daily 180 tablet 3   levothyroxine  (SYNTHROID ) 112 MCG tablet Take 112 mcg by mouth daily before breakfast.     loratadine  (CLARITIN ) 10 MG tablet Take 1 tablet (10 mg total) by mouth daily. 30 tablet 0    Multiple Vitamins-Minerals (OCUVITE ADULT 50+ PO) Take 1 tablet by mouth daily. (Patient taking differently: Take 1 tablet by mouth as needed.)     olmesartan  (BENICAR ) 40 MG tablet Take 1 tablet (40 mg total) by mouth daily. 90 tablet 3   rosuvastatin  (CRESTOR ) 40 MG tablet Take 1 tablet by mouth once daily 90 tablet 3   No current facility-administered medications for this encounter.    ROS- All systems are reviewed and negative except as per the HPI above  Physical Exam: Vitals:   08/28/24 1327  BP: 116/76  Pulse: 67  Weight: 93.3 kg  Height: 5' 5 (1.651 m)    Wt Readings from Last 3 Encounters:  08/28/24 93.3 kg  08/20/24 93 kg  08/19/24 92.1 kg    GEN: Well nourished, well developed in no acute distress CARDIAC: Regular rate and rhythm, no murmurs, rubs, gallops RESPIRATORY:  Clear to auscultation without rales, wheezing or rhonchi  ABDOMEN: Soft, non-tender, non-distended EXTREMITIES:  No edema; No deformity    EKG today demonstrates SR Vent. rate 67 BPM PR interval 146 ms QRS duration 90 ms QT/QTcB 418/442 ms   Epic records reviewed    CHA2DS2-VASc Score = 7  The patient's score is based upon: CHF History: 1 HTN History: 1 Diabetes History: 1 Stroke History: 0 Vascular Disease History: 1 Age Score: 2 Gender Score: 1       ASSESSMENT AND PLAN: Persistent Atrial Fibrillation (ICD10:  I48.19) The patient's CHA2DS2-VASc score is 7, indicating a 11.2% annual risk of stroke.   S/p afib ablation 07/28/21 and 12/14/22 S/p dofetilide  loading 05/2024 Patient appears to be maintaining SR. However, she reports significant anxiety about taking her medication on time. She would like to discuss potential ablation with Dr Inocencio in order to come off dofetilide .  Continue dofetilide  250 mcg BID Continue Eliquis  5 mg BID  Secondary Hypercoagulable State (ICD10:  D68.69) The patient is at significant risk for stroke/thromboembolism based upon her CHA2DS2-VASc Score  of 7.  Continue Apixaban  (Eliquis ). No bleeding issues.   High Risk Medication Monitoring (ICD 10: J342684) Patient requires ongoing monitoring for anti-arrhythmic medication which has the potential to cause life threatening arrhythmias. QT interval on ECG acceptable for dofetilide  monitoring. Check bmet/mag today.     HTN Stable on current regimen  CAD No anginal symptoms  OSA  Encouraged nightly CPAP Patient plans to discuss Zepbound with PCP.   Follow up with Dr Inocencio to discuss potential ablation.    Daril Kicks PA-C [facility] [address] [place], [place] [address] [phone]

## 2024-08-28 NOTE — Progress Notes (Signed)
 Office Visit Note  Patient: Claudia Cantrell             Date of Birth: November 28, 1946           MRN: [id number]             PCP: Lendia Boby CROME, NP-C Referring: Lendia Boby CROME, NP-C Visit Date: 08/31/2024 Occupation: Data Unavailable  Subjective:  Medication monitoring    History of Present Illness: Claudia Cantrell is a 78 y.o. female with history of gout.  She is taking allopurinol  100 mg daily.  She is tolerating it without any side effects and has not had any recent gaps in therapy.  Patient was last seen in the office on 11/21/23.  Patient states that she has been feeling mildly depressed due to the amount of medications she has to take.  Patient states that she has not had a gout flare in many years and is unsure if she truly requires the daily use of allopurinol .  She experiences intermittent arthralgias and joint stiffness and takes Tylenol  on occasion for symptomatic relief.  She denies any joint swelling at this time. She had a right glenohumeral joint injection on 11/21/23 which helped to alleviate her discomfort.  Activities of Daily Living:  Patient reports morning stiffness for less than 5 minutes.   Patient Denies nocturnal pain.  Difficulty dressing/grooming: Denies Difficulty climbing stairs: Reports Difficulty getting out of chair: Denies Difficulty using hands for taps, buttons, cutlery, and/or writing: Denies  Review of Systems  Constitutional:  Negative for fatigue.  HENT:  Negative for mouth sores and mouth dryness.   Eyes:  Negative for dryness.  Respiratory:  Negative for shortness of breath.   Cardiovascular:  Negative for chest pain and palpitations.  Gastrointestinal:  Negative for blood in stool, constipation and diarrhea.  Endocrine: Positive for increased urination.  Genitourinary:  Negative for involuntary urination.  Musculoskeletal:  Positive for joint pain, joint pain, joint swelling, myalgias, muscle weakness, morning stiffness and myalgias. Negative  for gait problem and muscle tenderness.  Skin:  Positive for hair loss and sensitivity to sunlight. Negative for color change and rash.  Allergic/Immunologic: Negative for susceptible to infections.  Neurological:  Negative for dizziness and headaches.  Hematological:  Negative for swollen glands.  Psychiatric/Behavioral:  Negative for depressed mood and sleep disturbance. The patient is not nervous/anxious.     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   Fever and chills 09/08/2023   RUQ pain 09/08/2023   Insulin  resistance 03/13/2023   Generalized obesity 03/11/2023   BMI 32.0-32.9,adult 03/11/2023   Health care maintenance 03/11/2023   SOB (shortness of breath) on exertion 03/11/2023   Other fatigue 03/11/2023   Postoperative hypothyroidism 06/28/2022   Hypercoagulable state due to persistent atrial fibrillation (HCC) 05/21/2022   Overactive bladder 05/13/2022   Mixed hyperlipidemia 05/09/2022   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/2022   Acquired hypothyroidism 10/30/2021   Stromal tumor determined by gastric biopsy 06/24/2020   Iatrogenic hyperthyroidism 05/02/2020   Dyslipidemia, goal LDL below 70 05/02/2020   Acute combined systolic and diastolic CHF, NYHA class 3 (HCC) 09/23/2019   Persistent atrial fibrillation (HCC) 09/21/2019   Submucosal lesion of stomach 07/19/2019   Abnormal CT of the abdomen 07/19/2019   Abnormal CT scan, stomach 06/17/2019   Advance directive declined by patient 01/14/2019  Chronic anticoagulation 01/14/2019   CRI (chronic renal insufficiency), stage 2 (mild) 01/14/2019   Prediabetes 01/14/2019   Persistent proteinuria 02/17/2018   Hyperuricemia 03/26/2017   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 1992 s/p MI and PTCA of unknown vessel;  b. 09/2010 Cath: LM nl, LAD 20p, LCX 1m, RCA 62m, RPL 20.   Cancer [facility])    Cervical cancer (HCC) 1977   Chest pain    Chronic kidney disease    Constipation    Family history of anesthesia complication    daughter has difficulty waking    GERD (gastroesophageal reflux disease)    occ   Gout 10/08/2016   History of heart attack    Hyperlipidemia    Hypertensive heart disease    Hypothyroidism    Joint pain    Nonischemic cardiomyopathy (HCC)    a. 07/2016 Echo: EF 40-45%, mild LVH, inferior akinesis, moderately dilated left atrium, trivial AI and MR.   PAF (paroxysmal atrial fibrillation) (HCC)    a. 07/2016 Admitted w/ AF RVR-->CHA2DS2VASc = 5-->Eliquis ;  b. 07/2016 successful TEE/DCCV.   Pre-diabetes    Sleep apnea    a. Using CPAP.   SOB (shortness of breath)    Vitamin D  deficiency     Family History  Problem Relation Age of Onset   Heart disease Mother    Sudden death Mother    Diabetes Father    Stroke Father    Bone cancer Sister    Sudden death Brother    Melanoma Brother    Heart disease Brother    Colon cancer Neg Hx    Esophageal cancer Neg Hx    Inflammatory bowel disease Neg Hx    Liver disease Neg Hx    Pancreatic cancer Neg Hx    Rectal cancer Neg Hx    Stomach cancer Neg Hx    Past Surgical History:  Procedure Laterality Date   ABDOMINAL HYSTERECTOMY  12/10/1986   ATRIAL FIBRILLATION ABLATION N/A 07/28/2021   Procedure: ATRIAL  FIBRILLATION ABLATION;  Surgeon: Inocencio Soyla Lunger, MD;  Location: [facility];  Service: Cardiovascular;  Laterality: N/A;   ATRIAL FIBRILLATION ABLATION N/A 12/14/2022   Procedure: ATRIAL FIBRILLATION ABLATION;  Surgeon: Inocencio Soyla Lunger, MD;  Location: [facility];  Service: Cardiovascular;  Laterality: N/A;   BACK SURGERY  12/10/1992   BIOPSY  09/02/2019   Procedure: BIOPSY;  Surgeon: Wilhelmenia Aloha Raddle., MD;  Location: [facility];  Service: Gastroenterology;;   BIOPSY  12/09/2019   Procedure: BIOPSY;  Surgeon: Wilhelmenia Aloha Raddle., MD;  Location: [facility];  Service: Gastroenterology;;   BIOPSY  03/08/2021   Procedure: BIOPSY;  Surgeon: Wilhelmenia Aloha Raddle., MD;  Location: [facility];  Service: Gastroenterology;;   BIOPSY  07/08/2023   Procedure: BIOPSY;  Surgeon: Wilhelmenia Aloha Raddle., MD;  Location: [facility];  Service: Gastroenterology;;   CARDIAC CATHETERIZATION  1991 AND  1992   PTCA BY DR CARLIN FORGET   CARDIOVERSION N/A 08/01/2016   Procedure: CARDIOVERSION;  Surgeon: Redell GORMAN Shallow, MD;  Location: [facility] ENDOSCOPY;  Service: Cardiovascular;  Laterality: N/A;   CARDIOVERSION N/A 01/11/2020   Procedure: CARDIOVERSION;  Surgeon: Delford Maude BROCKS, MD;  Location: [facility] ENDOSCOPY;  Service: Cardiovascular;  Laterality: N/A;   CARDIOVERSION N/A 07/08/2020   Procedure: CARDIOVERSION;  Surgeon: Kate Lonni CROME, MD;  Location: [facility] ENDOSCOPY;  Service: Cardiovascular;  Laterality: N/A;   CARDIOVERSION N/A 05/31/2022   Procedure: CARDIOVERSION;  Surgeon: Mona Vinie BROCKS, MD;  Location: [facility] ENDOSCOPY;  Service: Cardiovascular;  Laterality: N/A;   CARDIOVERSION N/A 07/18/2022   Procedure: CARDIOVERSION;  Surgeon: Kate Lonni CROME, MD;  Location: [facility] ENDOSCOPY;  Service: Cardiovascular;  Laterality: N/A;   CATARACT EXTRACTION  2024   June/July 2024 bilateral eyes   COLONOSCOPY  07/2023   ENDOSCOPIC MUCOSAL RESECTION N/A 12/09/2019    Procedure: ENDOSCOPIC MUCOSAL RESECTION;  Surgeon: Wilhelmenia Aloha Raddle., MD;  Location: [facility];  Service: Gastroenterology;  Laterality: N/A;   ESOPHAGOGASTRODUODENOSCOPY N/A 03/08/2021   Procedure: ESOPHAGOGASTRODUODENOSCOPY (EGD);  Surgeon: Wilhelmenia Aloha Raddle., MD;  Location: [facility];  Service: Gastroenterology;  Laterality: N/A;   ESOPHAGOGASTRODUODENOSCOPY (EGD) WITH PROPOFOL  N/A 09/02/2019   Procedure: ESOPHAGOGASTRODUODENOSCOPY (EGD) WITH PROPOFOL ;  Surgeon: Wilhelmenia Aloha Raddle., MD;  Location: [facility];  Service: Gastroenterology;  Laterality: N/A;   ESOPHAGOGASTRODUODENOSCOPY (EGD) WITH PROPOFOL  N/A 12/09/2019   Procedure: ESOPHAGOGASTRODUODENOSCOPY (EGD) WITH PROPOFOL ;  Surgeon: Wilhelmenia Aloha Raddle., MD;  Location: [facility];  Service: Gastroenterology;  Laterality: N/A;   ESOPHAGOGASTRODUODENOSCOPY (EGD) WITH PROPOFOL  N/A 07/08/2023   Procedure: ESOPHAGOGASTRODUODENOSCOPY (EGD) WITH PROPOFOL ;  Surgeon: Wilhelmenia Aloha Raddle., MD;  Location: [facility];  Service: Gastroenterology;  Laterality: N/A;   EUS N/A 09/02/2019   Procedure: UPPER ENDOSCOPIC ULTRASOUND (EUS) RADIAL;  Surgeon: Wilhelmenia Aloha Raddle., MD;  Location: [facility];  Service: Gastroenterology;  Laterality: N/A;  EUS radial/linear   EUS N/A 12/09/2019   Procedure: UPPER ENDOSCOPIC ULTRASOUND (EUS) RADIAL;  Surgeon: Wilhelmenia Aloha Raddle., MD;  Location: [facility];  Service: Gastroenterology;  Laterality: N/A;   EUS  12/09/2019   Procedure: UPPER ENDOSCOPIC ULTRASOUND (EUS) LINEAR;  Surgeon: Wilhelmenia Aloha Raddle., MD;  Location: [facility];  Service: Gastroenterology;;   EUS N/A 03/08/2021   Procedure: UPPER ENDOSCOPIC ULTRASOUND (EUS) RADIAL;  Surgeon: Wilhelmenia Aloha Raddle., MD;  Location: [facility];  Service: Gastroenterology;  Laterality: N/A;   FINE NEEDLE ASPIRATION  09/02/2019   Procedure: FINE NEEDLE ASPIRATION (FNA) LINEAR;  Surgeon: Wilhelmenia Aloha Raddle., MD;   Location: [facility];  Service: Gastroenterology;;   FINE NEEDLE ASPIRATION  12/09/2019   Procedure: FINE NEEDLE ASPIRATION (FNA) LINEAR;  Surgeon: Wilhelmenia Aloha Raddle., MD;  Location: [facility];  Service: Gastroenterology;;   HEMOSTASIS CLIP PLACEMENT  12/09/2019   Procedure: HEMOSTASIS CLIP PLACEMENT;  Surgeon: Wilhelmenia Aloha Raddle., MD;  Location: [facility];  Service: Gastroenterology;;   ORIF ANKLE FRACTURE Right 04/27/2015   Procedure: OPEN REDUCTION INTERNAL FIXATION (ORIF) RIGHT BIMALLEOLAR ANKLE FRACTURE WITH SYNDESMOSIS FIXATION;  Surgeon: Kay CHRISTELLA Cummins, MD;  Location: [facility];  Service: Orthopedics;  Laterality: Right;   SUBMUCOSAL LIFTING INJECTION  12/09/2019   Procedure: SUBMUCOSAL LIFTING INJECTION;  Surgeon: Wilhelmenia Aloha Raddle., MD;  Location: [facility];  Service: Gastroenterology;;   TEE WITHOUT CARDIOVERSION N/A 08/01/2016   Procedure: TRANSESOPHAGEAL ECHOCARDIOGRAM (TEE);  Surgeon: Redell GORMAN Shallow, MD;  Location: [facility];  Service: Cardiovascular;  Laterality: N/A;   THYROIDECTOMY  11/24/2013   DR GAIL  THYROIDECTOMY N/A 11/24/2013   Procedure: TOTAL THYROIDECTOMY;  Surgeon: Elon CHRISTELLA Pacini, MD;  Location: [facility] OR;  Service: General;  Laterality: N/A;   TRANSTHORACIC ECHOCARDIOGRAM  01/15/2013   EF 55% TO 65%. PROBABLE MILD HYPOKINESIS OF THE INFERIOR MYOCARDIUM. GRADE 1 DIASTOLIC DYSFUNCTION. TRIAL AR.LA IS MILDLY DILATED.   UPPER ESOPHAGEAL ENDOSCOPIC ULTRASOUND (EUS) N/A 07/08/2023   Procedure: UPPER ESOPHAGEAL ENDOSCOPIC ULTRASOUND (EUS);  Surgeon: Wilhelmenia Aloha Raddle., MD;  Location: [facility];  Service: Gastroenterology;  Laterality: N/A;   Social History   Tobacco Use   Smoking status: Former    Current packs/day: 0.00    Average packs/day: 1 pack/day for 15.0 years (15.0 ttl pk-yrs)    Types: Cigarettes    Start date: 12/11/1975    Quit date: 12/10/1990    Years since quitting: 33.7    Passive exposure: Never   Smokeless tobacco:  Never   Tobacco comments:    Former smoker 05/21/22  Vaping Use   Vaping status: Never Used  Substance Use Topics   Alcohol use: Yes    Comment: very rare   Drug use: No   Social History   Social History Narrative   Right handed    Living  alone.     Immunization History  Administered Date(s) Administered   Fluad Quad(high Dose 65+) 08/22/2019, 09/20/2021   INFLUENZA, HIGH DOSE SEASONAL PF 12/30/2014, 10/14/2017, 11/01/2018   Influenza Nasal 11/25/2013   Influenza,inj,Quad PF,6+ Mos 11/25/2013   Influenza-Unspecified 12/30/2012, 11/09/2015, 12/24/2016, 09/16/2020   PFIZER(Purple Top)SARS-COV-2 Vaccination 12/29/2019, 01/18/2020, 09/16/2020   PNEUMOCOCCAL CONJUGATE-20 12/26/2023   Pfizer Covid-19 Vaccine Bivalent Booster 5y-11y 10/21/2021   Pneumococcal Conjugate-13 12/30/2014, 10/14/2017   Pneumococcal Polysaccharide-23 11/25/2013, 08/22/2019   Zoster Recombinant(Shingrix) 10/14/2017, 01/21/2018   Zoster, Live 12/15/2008     Objective: Vital Signs: BP 135/84   Pulse 67   Temp 98 F (36.7 C)   Resp 14   Ht 5' 5 (1.651 m)   Wt 205 lb 12.8 oz (93.4 kg)   BMI 34.25 kg/m    Physical Exam Vitals and nursing note reviewed.  Constitutional:      Appearance: She is well-developed.  HENT:     Head: Normocephalic and atraumatic.  Eyes:     Conjunctiva/sclera: Conjunctivae normal.  Cardiovascular:     Rate and Rhythm: Normal rate and regular rhythm.     Heart sounds: Normal heart sounds.  Pulmonary:     Effort: Pulmonary effort is normal.     Breath sounds: Normal breath sounds.  Abdominal:     General: Bowel sounds are normal.     Palpations: Abdomen is soft.  Musculoskeletal:     Cervical back: Normal range of motion.  Lymphadenopathy:     Cervical: No cervical adenopathy.  Skin:    General: Skin is warm and dry.     Capillary Refill: Capillary refill takes less than 2 seconds.  Neurological:     Mental Status: She is alert and oriented to person, place,  and time.  Psychiatric:        Behavior: Behavior normal.      Musculoskeletal Exam: C-spine has good range of motion.  Thoracic kyphosis noted.  Shoulder joints have good range of motion with no discomfort at this time.  Elbow joints have good range of motion with no tenderness along the joint line.  Wrist joints, MCPs, PIPs, DIPs have good range of motion with no synovitis.  Hip joints have good range of motion with no groin pain.  Knee  joints have good range of motion with no warmth or effusion.  Ankle joints have good range of motion with no tenderness or joint swelling.  CDAI Exam: CDAI Score: -- Patient Global: --; Provider Global: -- Swollen: --; Tender: -- Joint Exam 08/31/2024   No joint exam has been documented for this visit   There is currently no information documented on the homunculus. Go to the Rheumatology activity and complete the homunculus joint exam.  Investigation: No additional findings.  Imaging: No results found.  Recent Labs: Lab Results  Component Value Date   WBC 3.3 (L) 05/01/2024   HGB 15.5 (H) 05/01/2024   PLT 197.0 05/01/2024   NA 146 (H) 08/28/2024   K 4.2 08/28/2024   CL 107 (H) 08/28/2024   CO2 22 08/28/2024   GLUCOSE 86 08/28/2024   BUN 13 08/28/2024   CREATININE 1.02 (H) 08/28/2024   BILITOT 0.7 04/02/2024   ALKPHOS 114 04/02/2024   AST 17 04/02/2024   ALT 16 04/02/2024   PROT 7.3 04/02/2024   ALBUMIN 4.5 04/02/2024   CALCIUM  9.6 08/28/2024   GFRAA 74 09/16/2020    Speciality Comments: No specialty comments available.  Procedures:  No procedures performed Allergies: Labetalol and Codeine   Assessment / Plan:     Visit Diagnoses: Idiopathic chronic gout of multiple sites without tophus - She has not had any signs or symptoms of a gout flare.  She has clinically been doing well taking allopurinol  100 mg 1 tablet by mouth daily.  She is tolerating allopurinol  without any side effects and has not had any gaps in therapy.  She does  not like having to take a daily urate lowering medication especially since she has not had a gout flare in many years.  Discussed the importance of remaining on low-dose allopurinol  if possible especially since she is tolerating it without any side effects.   No inflammation noted on examination today.  No tophi noted.  Plan to check uric acid level today.  Can consider dose adjustments in the future if needed.  She will remain on allopurinol  100 mg 1 tablet by mouth daily with close monitoring.  She will follow-up in the office in 6 months or sooner if needed.   Plan: Uric acid  Hyperuricemia - Plan to check uric acid level today. Plan: Uric acid  Medication monitoring encounter - She is taking allopurinol  100 mg 1 tablet by mouth daily.  Plan to check the following lab work today.   BMP updated on 08/28/24.   Uric acid updated on 12/11/22-2.4. Plan to check uric acid level today.  Plan: Uric acid, Hepatic function panel, CBC with Differential/Platelet  Chronic pain of both shoulders: She had a right glenohumeral joint cortisone injection performed on 11/21/2023, which alleviated her discomfort.  She has good ROM of both shoulders with no discomfort at this time.   Other medical conditions are listed as follows:  History of hypertension: Blood pressure was 135/84 today in the office.  Chronic anticoagulation: She remains on Eliquis  as prescribed.  She has been avoiding the use of NSAIDs.  History of coronary artery disease  History of atrial fibrillation: Currently taking Tikosyn  as prescribed.  History of hyperlipidemia: She remains on Crestor  40 mg daily.  History of gastrointestinal stromal tumor (GIST)  OSA on CPAP  Elevated hemoglobin A1c  History of vitamin D  deficiency    Orders: Orders Placed This Encounter  Procedures   Uric acid   Hepatic function panel   CBC with  Differential/Platelet   No orders of the defined types were placed in this encounter.   Follow-Up  Instructions: Return in about 6 months (around 02/28/2025) for Gout.   Waddell CHRISTELLA Craze, PA-C  Note - This record has been created using Dragon software.  Chart creation errors have been sought, but may not always  have been located. Such creation errors do not reflect on  the standard of medical care.

## 2024-08-29 LAB — BASIC METABOLIC PANEL WITH GFR
BUN/Creatinine Ratio: 13 (ref 12–28)
BUN: 13 mg/dL (ref 8–27)
CO2: 22 mmol/L (ref 20–29)
Calcium: 9.6 mg/dL (ref 8.7–10.3)
Chloride: 107 mmol/L — ABNORMAL HIGH (ref 96–106)
Creatinine, Ser: 1.02 mg/dL — ABNORMAL HIGH (ref 0.57–1.00)
Glucose: 86 mg/dL (ref 70–99)
Potassium: 4.2 mmol/L (ref 3.5–5.2)
Sodium: 146 mmol/L — ABNORMAL HIGH (ref 134–144)
eGFR: 57 mL/min/1.73 — ABNORMAL LOW (ref >59)

## 2024-08-29 LAB — MAGNESIUM: Magnesium: 2.4 mg/dL — ABNORMAL HIGH (ref 1.6–2.3)

## 2024-08-31 ENCOUNTER — Ambulatory Visit (HOSPITAL_COMMUNITY): Payer: Self-pay | Admitting: Physician Assistant

## 2024-08-31 ENCOUNTER — Ambulatory Visit: Attending: Physician Assistant | Admitting: Physician Assistant

## 2024-08-31 ENCOUNTER — Encounter: Payer: Self-pay | Admitting: Physician Assistant

## 2024-08-31 VITALS — BP 135/84 | HR 67 | Temp 98.0°F | Resp 14 | Ht 65.0 in | Wt 205.8 lb

## 2024-08-31 DIAGNOSIS — M1A09X0 Idiopathic chronic gout, multiple sites, without tophus (tophi): Secondary | ICD-10-CM

## 2024-08-31 DIAGNOSIS — E790 Hyperuricemia without signs of inflammatory arthritis and tophaceous disease: Secondary | ICD-10-CM

## 2024-08-31 DIAGNOSIS — Z8679 Personal history of other diseases of the circulatory system: Secondary | ICD-10-CM

## 2024-08-31 DIAGNOSIS — G8929 Other chronic pain: Secondary | ICD-10-CM

## 2024-08-31 DIAGNOSIS — Z8639 Personal history of other endocrine, nutritional and metabolic disease: Secondary | ICD-10-CM

## 2024-08-31 DIAGNOSIS — Z5181 Encounter for therapeutic drug level monitoring: Secondary | ICD-10-CM

## 2024-08-31 DIAGNOSIS — Z7901 Long term (current) use of anticoagulants: Secondary | ICD-10-CM

## 2024-08-31 DIAGNOSIS — M25511 Pain in right shoulder: Secondary | ICD-10-CM

## 2024-08-31 DIAGNOSIS — Z8509 Personal history of malignant neoplasm of other digestive organs: Secondary | ICD-10-CM

## 2024-08-31 DIAGNOSIS — G4733 Obstructive sleep apnea (adult) (pediatric): Secondary | ICD-10-CM

## 2024-08-31 DIAGNOSIS — R7309 Other abnormal glucose: Secondary | ICD-10-CM

## 2024-08-31 DIAGNOSIS — M25512 Pain in left shoulder: Secondary | ICD-10-CM

## 2024-09-01 ENCOUNTER — Ambulatory Visit: Payer: Self-pay | Admitting: Physician Assistant

## 2024-09-01 LAB — CBC WITH DIFFERENTIAL/PLATELET
Absolute Lymphocytes: 1061 {cells}/uL (ref 850–3900)
Absolute Monocytes: 242 {cells}/uL (ref 200–950)
Basophils Absolute: 21 {cells}/uL (ref 0–200)
Basophils Relative: 0.6 %
Eosinophils Absolute: 49 {cells}/uL (ref 15–500)
Eosinophils Relative: 1.4 %
HCT: 48.6 % — ABNORMAL HIGH (ref 35.0–45.0)
Hemoglobin: 15.4 g/dL (ref 11.7–15.5)
MCH: 30.1 pg (ref 27.0–33.0)
MCHC: 31.7 g/dL — ABNORMAL LOW (ref 32.0–36.0)
MCV: 95.1 fL (ref 80.0–100.0)
MPV: 8.7 fL (ref 7.5–12.5)
Monocytes Relative: 6.9 %
Neutro Abs: 2128 {cells}/uL (ref 1500–7800)
Neutrophils Relative %: 60.8 %
Platelets: 208 Thousand/uL (ref 140–400)
RBC: 5.11 Million/uL — ABNORMAL HIGH (ref 3.80–5.10)
RDW: 14.4 % (ref 11.0–15.0)
Total Lymphocyte: 30.3 %
WBC: 3.5 Thousand/uL — ABNORMAL LOW (ref 3.8–10.8)

## 2024-09-01 LAB — HEPATIC FUNCTION PANEL
AG Ratio: 1.5 (calc) (ref 1.0–2.5)
ALT: 19 U/L (ref 6–29)
AST: 22 U/L (ref 10–35)
Albumin: 4.7 g/dL (ref 3.6–5.1)
Alkaline phosphatase (APISO): 114 U/L (ref 37–153)
Bilirubin, Direct: 0.1 mg/dL (ref 0.0–0.2)
Globulin: 3.1 g/dL (ref 1.9–3.7)
Indirect Bilirubin: 0.5 mg/dL (ref 0.2–1.2)
Total Bilirubin: 0.6 mg/dL (ref 0.2–1.2)
Total Protein: 7.8 g/dL (ref 6.1–8.1)

## 2024-09-01 LAB — URIC ACID: Uric Acid, Serum: 2.8 mg/dL (ref 2.5–7.0)

## 2024-09-01 NOTE — Progress Notes (Signed)
 White blood cell count remains low but has improved: 3.5. rest of CBC stable.   Hepatic function panel WNL.   Uric acid  WNL-2.8.  Okay to continue current dose of allopurinol .

## 2024-09-02 ENCOUNTER — Other Ambulatory Visit (HOSPITAL_COMMUNITY): Payer: Self-pay

## 2024-09-02 ENCOUNTER — Telehealth: Payer: Self-pay

## 2024-09-02 ENCOUNTER — Ambulatory Visit: Admitting: Internal Medicine

## 2024-09-02 ENCOUNTER — Encounter: Payer: Self-pay | Admitting: Internal Medicine

## 2024-09-02 VITALS — BP 126/82 | HR 71 | Ht 65.0 in | Wt 204.0 lb

## 2024-09-02 DIAGNOSIS — E890 Postprocedural hypothyroidism: Secondary | ICD-10-CM | POA: Diagnosis not present

## 2024-09-02 DIAGNOSIS — R7303 Prediabetes: Secondary | ICD-10-CM | POA: Diagnosis not present

## 2024-09-02 MED ORDER — WEGOVY 0.25 MG/0.5ML ~~LOC~~ SOAJ: 0.2500 mg | SUBCUTANEOUS | 6 refills | Status: AC

## 2024-09-02 NOTE — Progress Notes (Unsigned)
 Name: Claudia Cantrell  MRN/ DOB: [id number], [birth year]    Age/ Sex: 78 y.o., female    PCP: Lendia Boby CROME, NP-C   Reason for Endocrinology Evaluation: Hypothyroidism     Date of Initial Endocrinology Evaluation: 09/02/2024     HPI: Claudia Cantrell is a 78 y.o. female with a past medical history of postoperative hypothyroidism, HTN, dyslipidemia and A.Fib . The patient presented for initial endocrinology clinic visit on 09/02/2024 for consultative assistance with her Hypothyroidism.   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, 1 year EUS is recommended She continues to follow-up with cardiology for A-fib, She is S/P cardio ablation 12/2022, she is on Tikosyn  and farxiga    Patient has been continued weight gain since being off Ozempic , which was denied by her insurance company No local neck swelling  She started using a pillbox  No palpitations  Has noted left palm tenderness for the past week at the center , doesn't call injury,  Denies constipation or diarrhea  She had two UTI's that she attributes to Tikosyn    Synthroid  100 mcg, 1 tab daily - still takes 112 mcg daily     HISTORY:  Past Medical History:  Past Medical History:  Diagnosis Date   Arthritis    rheumatoid   Back pain    CAD (coronary artery disease)    a. 1992 s/p MI and PTCA of unknown vessel;  b. 09/2010 Cath: LM nl, LAD 20p, LCX 37m, RCA 46m,  RPL 20.   Cancer [facility])    Cervical cancer (HCC) 1977   Chest pain    Chronic kidney disease    Constipation    Family history of anesthesia complication    daughter has difficulty waking    GERD (gastroesophageal reflux disease)    occ   Gout 10/08/2016   History of heart attack    Hyperlipidemia    Hypertensive heart disease    Hypothyroidism    Joint pain    Nonischemic cardiomyopathy (HCC)    a. 07/2016 Echo: EF 40-45%, mild LVH, inferior akinesis, moderately dilated left atrium, trivial AI and MR.   PAF (paroxysmal atrial fibrillation) (HCC)    a. 07/2016 Admitted w/ AF RVR-->CHA2DS2VASc = 5-->Eliquis ;  b. 07/2016 successful TEE/DCCV.   Pre-diabetes    Sleep apnea    a. Using CPAP.   SOB (shortness of breath)    Vitamin D  deficiency    Past Surgical History:  Past Surgical History:  Procedure Laterality Date   ABDOMINAL HYSTERECTOMY  12/10/1986   ATRIAL FIBRILLATION ABLATION N/A 07/28/2021   Procedure: ATRIAL FIBRILLATION ABLATION;  Surgeon: Inocencio Soyla Lunger, MD;  [facility]: [facility];  Service: Cardiovascular;  Laterality: N/A;   ATRIAL FIBRILLATION ABLATION N/A 12/14/2022   Procedure: ATRIAL FIBRILLATION ABLATION;  Surgeon: Inocencio Soyla Lunger, MD;  Location: [facility];  Service: Cardiovascular;  Laterality: N/A;   BACK SURGERY  12/10/1992  BIOPSY  09/02/2019   Procedure: BIOPSY;  Surgeon: Wilhelmenia Aloha Raddle., MD;  Location: [facility];  Service: Gastroenterology;;   BIOPSY  12/09/2019   Procedure: BIOPSY;  Surgeon: Wilhelmenia Aloha Raddle., MD;  Location: [facility];  Service: Gastroenterology;;   BIOPSY  03/08/2021   Procedure: BIOPSY;  Surgeon: Wilhelmenia Aloha Raddle., MD;  Location: [facility];  Service: Gastroenterology;;   BIOPSY  07/08/2023   Procedure: BIOPSY;  Surgeon: Wilhelmenia Aloha Raddle., MD;  Location: [facility];  Service: Gastroenterology;;   CARDIAC CATHETERIZATION  1991 AND 1992   PTCA BY DR CARLIN FORGET    CARDIOVERSION N/A 08/01/2016   Procedure: CARDIOVERSION;  Surgeon: Redell GORMAN Shallow, MD;  Location: [facility] ENDOSCOPY;  Service: Cardiovascular;  Laterality: N/A;   CARDIOVERSION N/A 01/11/2020   Procedure: CARDIOVERSION;  Surgeon: Delford Maude BROCKS, MD;  Location: [facility] ENDOSCOPY;  Service: Cardiovascular;  Laterality: N/A;   CARDIOVERSION N/A 07/08/2020   Procedure: CARDIOVERSION;  Surgeon: Kate Lonni CROME, MD;  Location: [facility] ENDOSCOPY;  Service: Cardiovascular;  Laterality: N/A;   CARDIOVERSION N/A 05/31/2022   Procedure: CARDIOVERSION;  Surgeon: Mona Vinie BROCKS, MD;  Location: [facility] ENDOSCOPY;  Service: Cardiovascular;  Laterality: N/A;   CARDIOVERSION N/A 07/18/2022   Procedure: CARDIOVERSION;  Surgeon: Kate Lonni CROME, MD;  Location: [facility] ENDOSCOPY;  Service: Cardiovascular;  Laterality: N/A;   CATARACT EXTRACTION  2024   June/July 2024 bilateral eyes   COLONOSCOPY  07/2023   ENDOSCOPIC MUCOSAL RESECTION N/A 12/09/2019   Procedure: ENDOSCOPIC MUCOSAL RESECTION;  Surgeon: Wilhelmenia Aloha Raddle., MD;  Location: [facility];  Service: Gastroenterology;  Laterality: N/A;   ESOPHAGOGASTRODUODENOSCOPY N/A 03/08/2021   Procedure: ESOPHAGOGASTRODUODENOSCOPY (EGD);  Surgeon: Wilhelmenia Aloha Raddle., MD;  Location: [facility];  Service: Gastroenterology;  Laterality: N/A;   ESOPHAGOGASTRODUODENOSCOPY (EGD) WITH PROPOFOL  N/A 09/02/2019   Procedure: ESOPHAGOGASTRODUODENOSCOPY (EGD) WITH PROPOFOL ;  Surgeon: Wilhelmenia Aloha Raddle., MD;  Location: [facility];  Service: Gastroenterology;  Laterality: N/A;   ESOPHAGOGASTRODUODENOSCOPY (EGD) WITH PROPOFOL  N/A 12/09/2019   Procedure: ESOPHAGOGASTRODUODENOSCOPY (EGD) WITH PROPOFOL ;  Surgeon: Wilhelmenia Aloha Raddle., MD;  Location: [facility];  Service: Gastroenterology;  Laterality: N/A;   ESOPHAGOGASTRODUODENOSCOPY (EGD) WITH PROPOFOL  N/A 07/08/2023   Procedure: ESOPHAGOGASTRODUODENOSCOPY (EGD) WITH PROPOFOL ;  Surgeon: Wilhelmenia Aloha Raddle., MD;   Location: [facility];  Service: Gastroenterology;  Laterality: N/A;   EUS N/A 09/02/2019   Procedure: UPPER ENDOSCOPIC ULTRASOUND (EUS) RADIAL;  Surgeon: Wilhelmenia Aloha Raddle., MD;  Location: [facility];  Service: Gastroenterology;  Laterality: N/A;  EUS radial/linear   EUS N/A 12/09/2019   Procedure: UPPER ENDOSCOPIC ULTRASOUND (EUS) RADIAL;  Surgeon: Wilhelmenia Aloha Raddle., MD;  Location: [facility];  Service: Gastroenterology;  Laterality: N/A;   EUS  12/09/2019   Procedure: UPPER ENDOSCOPIC ULTRASOUND (EUS) LINEAR;  Surgeon: Wilhelmenia Aloha Raddle., MD;  Location: [facility];  Service: Gastroenterology;;   EUS N/A 03/08/2021   Procedure: UPPER ENDOSCOPIC ULTRASOUND (EUS) RADIAL;  Surgeon: Wilhelmenia Aloha Raddle., MD;  Location: [facility];  Service: Gastroenterology;  Laterality: N/A;   FINE NEEDLE ASPIRATION  09/02/2019   Procedure: FINE NEEDLE ASPIRATION (FNA) LINEAR;  Surgeon: Wilhelmenia Aloha Raddle., MD;  Location: [facility];  Service: Gastroenterology;;   FINE NEEDLE ASPIRATION  12/09/2019   Procedure: FINE NEEDLE ASPIRATION (FNA) LINEAR;  Surgeon: Wilhelmenia Aloha Raddle., MD;  Location: [facility];  Service: Gastroenterology;;   HEMOSTASIS CLIP PLACEMENT  12/09/2019   Procedure: HEMOSTASIS CLIP PLACEMENT;  Surgeon: Wilhelmenia Aloha Raddle., MD;  Location: [facility];  Service: Gastroenterology;;   ORIF ANKLE FRACTURE Right 04/27/2015  Procedure: OPEN REDUCTION INTERNAL FIXATION (ORIF) RIGHT BIMALLEOLAR ANKLE FRACTURE WITH SYNDESMOSIS FIXATION;  Surgeon: Kay CHRISTELLA Cummins, MD;  Location: [facility];  Service: Orthopedics;  Laterality: Right;   SUBMUCOSAL LIFTING INJECTION  12/09/2019   Procedure: SUBMUCOSAL LIFTING INJECTION;  Surgeon: Wilhelmenia Aloha Raddle., MD;  Location: [facility];  Service: Gastroenterology;;   TEE WITHOUT CARDIOVERSION N/A 08/01/2016   Procedure: TRANSESOPHAGEAL ECHOCARDIOGRAM (TEE);  Surgeon: Redell GORMAN Shallow, MD;  Location: [facility] ENDOSCOPY;  Service:  Cardiovascular;  Laterality: N/A;   THYROIDECTOMY  11/24/2013   DR GAIL   THYROIDECTOMY N/A 11/24/2013   Procedure: TOTAL THYROIDECTOMY;  Surgeon: Elon CHRISTELLA GAIL, MD;  Location: [facility] OR;  Service: General;  Laterality: N/A;   TRANSTHORACIC ECHOCARDIOGRAM  01/15/2013   EF 55% TO 65%. PROBABLE MILD HYPOKINESIS OF THE INFERIOR MYOCARDIUM. GRADE 1 DIASTOLIC DYSFUNCTION. TRIAL AR.LA IS MILDLY DILATED.   UPPER ESOPHAGEAL ENDOSCOPIC ULTRASOUND (EUS) N/A 07/08/2023   Procedure: UPPER ESOPHAGEAL ENDOSCOPIC ULTRASOUND (EUS);  Surgeon: Wilhelmenia Aloha Raddle., MD;  Location: [facility];  Service: Gastroenterology;  Laterality: N/A;    Social History:  reports that she quit smoking about 33 years ago. Her smoking use included cigarettes. She started smoking about 48 years ago. She has a 15 pack-year smoking history. She has never been exposed to tobacco smoke. She has never used smokeless tobacco. She reports current alcohol use. She reports that she does not use drugs. Family History: family history includes Bone cancer in her sister; Diabetes in her father; Heart disease in her brother and mother; Melanoma in her brother; Stroke in her father; Sudden death in her brother and mother.   HOME MEDICATIONS: Allergies as of 09/02/2024       Reactions   Labetalol    headaches   Codeine Anxiety        Medication List        Accurate as of September 02, 2024  8:34 AM. If you have any questions, ask your nurse or doctor.          acetaminophen  500 MG tablet Commonly known as: TYLENOL  Take 1,000 mg by mouth every 6 (six) hours as needed for moderate pain.   allopurinol  100 MG tablet Commonly known as: ZYLOPRIM  Take 1 tablet (100 mg total) by mouth daily.   amLODipine  5 MG tablet Commonly known as: NORVASC  Take 1.5 tablets (7.5 mg total) by mouth daily.   cyanocobalamin  1000 MCG tablet Commonly known as: VITAMIN B12 Take 1,000 mcg by mouth daily.   dapagliflozin propanediol 10 MG Tabs  tablet Commonly known as: FARXIGA Take 10 mg by mouth in the morning.   dofetilide  250 MCG capsule Commonly known as: TIKOSYN  Take 1 capsule (250 mcg total) by mouth 2 (two) times daily.   Eliquis  5 MG Tabs tablet Generic drug: apixaban  Take 1 tablet by mouth twice daily   ezetimibe  10 MG tablet Commonly known as: ZETIA  Take 1 tablet (10 mg total) by mouth daily.   furosemide  20 MG tablet Commonly known as: LASIX  Take 20 mg by mouth daily as needed for edema.   isosorbide  dinitrate 5 MG tablet Commonly known as: ISORDIL  Take 1 tablet by mouth twice daily   levothyroxine  112 MCG tablet Commonly known as: SYNTHROID  Take 112 mcg by mouth daily before breakfast.   loratadine  10 MG tablet Commonly known as: CLARITIN  Take 1 tablet (10 mg total) by mouth daily.   OCUVITE ADULT 50+ PO Take 1 tablet by mouth daily. What changed:  when to take this reasons to  take this   olmesartan  40 MG tablet Commonly known as: BENICAR  Take 1 tablet (40 mg total) by mouth daily.   rosuvastatin  40 MG tablet Commonly known as: CRESTOR  Take 1 tablet by mouth once daily   sodium bicarbonate 650 MG tablet Take 650 mg by mouth 2 (two) times daily.          REVIEW OF SYSTEMS: A comprehensive ROS was conducted with the patient and is negative except as per HPI    OBJECTIVE:  VS: BP 126/82 (BP Location: Left Arm, Patient Position: Sitting, Cuff Size: Normal)   Pulse 71   Ht 5' 5 (1.651 m)   Wt 204 lb (92.5 kg)   SpO2 97%   BMI 33.95 kg/m    Wt Readings from Last 3 Encounters:  09/02/24 204 lb (92.5 kg)  08/31/24 205 lb 12.8 oz (93.4 kg)  08/28/24 205 lb 9.6 oz (93.3 kg)     EXAM: General: Pt appears well and is in NAD  Neck: General: Supple without adenopathy. Thyroid :  No goiter or nodules appreciated.  Lungs: Clear with good BS bilat   Heart: Auscultation: RRR.  Extremities:  BL LE: No pretibial edema   Mental Status: Judgment, insight: Intact Orientation:  Oriented to time, place, and person Mood and affect: No depression, anxiety, or agitation     DATA REVIEWED: *****    ASSESSMENT/PLAN/RECOMMENDATIONS:   Postoperative Hypothyroidism    - Pt is  clinically euthyroid  - No local neck symptoms - I discontinued 112 mcg Synthroid  on her last visit and switch to Synthroid  100 mcg, but the patient still has Synthroid  112 mcg dose   Medications : Synthroid  100 mcg daily    2. Pre-diabetes :  -A1c ***% -Patient is on Farxiga through cardiology -She was on Ozempic  through nephrology, but it appears insurance is not covering for it anymore, and has regained the weight -Referral to weight management clinic in [place] has been placed,  3. Weight Gain/Obesity    - Insurance declined Ozempic , will try Wegovy  - She did well on Ozempic   Medication  Start Wegovy  0.25 mg once weekly  4.  Recurrent UTIs  -She has been treated for UTIs and has noted increased frequency.  Patient under the impression this is caused by Tikosyn , I did encourage the patient to discuss with her nephrologist as they have prescribed Farxiga, I did increase the risk of UTIs with Claudia Cantrell  F/U 6 months   Signed electronically by: Stefano Redgie Butts, MD  [facility]  [facility] [address] [place] [place], [place] [address] Phone: [phone] FAX: [phone]   CC: Lendia Boby CROME, NP-C [address] [place] [address] Phone: [phone] Fax: [phone]   Return to Endocrinology clinic as below: Future Appointments  Date Time Provider Department Center  12/11/2024 10:20 AM Shlomo Wilbert SAUNDERS, MD [facility]  03/01/2025  1:30 PM Cheryl Waddell HERO, PA-C [facility] None  05/06/2025 10:10 AM [facility]-ANNUAL WELLNESS VISIT [facility] Landy Stains

## 2024-09-02 NOTE — Telephone Encounter (Signed)
 Pharmacy Patient Advocate Encounter  Received notification from [employer] that Prior Authorization for Wegovy  0.25MG /0.5ML auto-injectors has been APPROVED from 09/02/24 to 12/09/2024   PA #/Case ID/Reference #: [id number]

## 2024-09-02 NOTE — Telephone Encounter (Addendum)
 Pharmacy Patient Advocate Encounter   Received notification from Cover[employer]eds that prior authorization for Wegovy  0.25MG /0.5ML auto-injectors is required/requested.   Insurance verification completed.   The patient is insured through [facility] .   Per test claim: PA required; PA submitted to above mentioned insurance via Latent Key/confirmation #/EOC [id number] Status is pending

## 2024-09-03 ENCOUNTER — Ambulatory Visit: Payer: Self-pay | Admitting: Internal Medicine

## 2024-09-03 LAB — HEMOGLOBIN A1C
Hgb A1c MFr Bld: 5.7 % — ABNORMAL HIGH (ref <5.7)
Mean Plasma Glucose: 117 mg/dL
eAG (mmol/L): 6.5 mmol/L

## 2024-09-03 LAB — TSH: TSH: 0.28 m[IU]/L — ABNORMAL LOW (ref 0.40–4.50)

## 2024-09-03 MED ORDER — SYNTHROID 100 MCG PO TABS: 100.0000 ug | ORAL_TABLET | Freq: Every day | ORAL | 2 refills | Status: AC

## 2024-09-03 NOTE — Telephone Encounter (Signed)
 Please let the patient know that she needs to stop the Synthroid  112 mcg and start Synthroid  100 mcg daily   A prescription will be sent to her pharmacy, she needs to make sure they are giving her the right dose   Schedule her for a lab appointment in 2 months   Let her know that her A1c is improving which means her sugar is getting better   Thanks

## 2024-09-04 ENCOUNTER — Other Ambulatory Visit: Payer: Self-pay | Admitting: Internal Medicine

## 2024-09-29 ENCOUNTER — Ambulatory Visit (HOSPITAL_COMMUNITY): Admitting: Physician Assistant

## 2024-11-01 ENCOUNTER — Encounter (HOSPITAL_COMMUNITY): Payer: Self-pay | Admitting: Emergency Medicine

## 2024-11-01 ENCOUNTER — Emergency Department (HOSPITAL_COMMUNITY)
Admission: EM | Admit: 2024-11-01 | Discharge: 2024-11-01 | Disposition: A | Discharge status: Home / Self Care | Attending: Emergency Medicine | Admitting: Emergency Medicine

## 2024-11-01 ENCOUNTER — Other Ambulatory Visit: Payer: Self-pay

## 2024-11-01 ENCOUNTER — Ambulatory Visit (HOSPITAL_COMMUNITY)
Admission: EM | Admit: 2024-11-01 | Discharge: 2024-11-01 | Disposition: A | Discharge status: Home / Self Care | Attending: Internal Medicine | Admitting: Internal Medicine

## 2024-11-01 ENCOUNTER — Emergency Department (HOSPITAL_COMMUNITY)

## 2024-11-01 ENCOUNTER — Encounter (HOSPITAL_COMMUNITY): Payer: Self-pay | Admitting: Pharmacy Technician

## 2024-11-01 DIAGNOSIS — R079 Chest pain, unspecified: Secondary | ICD-10-CM | POA: Insufficient documentation

## 2024-11-01 DIAGNOSIS — R600 Localized edema: Secondary | ICD-10-CM | POA: Diagnosis not present

## 2024-11-01 DIAGNOSIS — I480 Paroxysmal atrial fibrillation: Secondary | ICD-10-CM | POA: Diagnosis not present

## 2024-11-01 DIAGNOSIS — Z7901 Long term (current) use of anticoagulants: Secondary | ICD-10-CM | POA: Diagnosis not present

## 2024-11-01 DIAGNOSIS — I2510 Atherosclerotic heart disease of native coronary artery without angina pectoris: Secondary | ICD-10-CM | POA: Insufficient documentation

## 2024-11-01 DIAGNOSIS — N189 Chronic kidney disease, unspecified: Secondary | ICD-10-CM | POA: Insufficient documentation

## 2024-11-01 DIAGNOSIS — R0602 Shortness of breath: Secondary | ICD-10-CM

## 2024-11-01 LAB — BASIC METABOLIC PANEL WITH GFR
Anion gap: 10 (ref 5–15)
BUN: 9 mg/dL (ref 8–23)
CO2: 23 mmol/L (ref 22–32)
Calcium: 9.7 mg/dL (ref 8.9–10.3)
Chloride: 110 mmol/L (ref 98–111)
Creatinine, Ser: 0.87 mg/dL (ref 0.44–1.00)
GFR, Estimated: >60 mL/min (ref >60)
Glucose, Bld: 75 mg/dL (ref 70–99)
Potassium: 4.2 mmol/L (ref 3.5–5.1)
Sodium: 143 mmol/L (ref 135–145)

## 2024-11-01 LAB — I-STAT CHEM 8, ED
BUN: 9 mg/dL (ref 8–23)
Calcium, Ion: 1.19 mmol/L (ref 1.15–1.40)
Chloride: 109 mmol/L (ref 98–111)
Creatinine, Ser: 0.9 mg/dL (ref 0.44–1.00)
Glucose, Bld: 81 mg/dL (ref 70–99)
HCT: 51 % — ABNORMAL HIGH (ref 36.0–46.0)
Hemoglobin: 17.3 g/dL — ABNORMAL HIGH (ref 12.0–15.0)
Potassium: 3.9 mmol/L (ref 3.5–5.1)
Sodium: 143 mmol/L (ref 135–145)
TCO2: 22 mmol/L (ref 22–32)

## 2024-11-01 LAB — CBC
HCT: 50 % — ABNORMAL HIGH (ref 36.0–46.0)
Hemoglobin: 15.8 g/dL — ABNORMAL HIGH (ref 12.0–15.0)
MCH: 30 pg (ref 26.0–34.0)
MCHC: 31.6 g/dL (ref 30.0–36.0)
MCV: 94.9 fL (ref 80.0–100.0)
Platelets: 221 K/uL (ref 150–400)
RBC: 5.27 MIL/uL — ABNORMAL HIGH (ref 3.87–5.11)
RDW: 14.4 % (ref 11.5–15.5)
WBC: 4.3 K/uL (ref 4.0–10.5)
nRBC: 0 % (ref 0.0–0.2)

## 2024-11-01 LAB — TROPONIN I (HIGH SENSITIVITY)
Troponin I (High Sensitivity): 8 ng/L (ref <18)
Troponin I (High Sensitivity): 10 ng/L (ref <18)

## 2024-11-01 LAB — BRAIN NATRIURETIC PEPTIDE: B Natriuretic Peptide: 69.5 pg/mL (ref 0.0–100.0)

## 2024-11-01 NOTE — ED Provider Triage Note (Signed)
 Emergency Medicine Provider Triage Evaluation Note  Claudia Cantrell , a 78 y.o. female  was evaluated in triage.  Pt complains of cp. Endorse exertional cp with sob and lightheaded while moving large boxes this AM.  No active CP.  No fever, chills, cough  Review of Systems  Positive: As above Negative: As above  Physical Exam  BP (!) 148/80   Pulse 63   Temp 97.6 F (36.4 C)   Resp 14   SpO2 96%  Gen:   Awake, no distress   Resp:  Normal effort  MSK:   Moves extremities without difficulty  Other:    Medical Decision Making  Medically screening exam initiated at 3:33 PM.  Appropriate orders placed.  Claudia Cantrell was informed that the remainder of the evaluation will be completed by another provider, this initial triage assessment does not replace that evaluation, and the importance of remaining in the ED until their evaluation is complete.     Claudia Colon, PA-C 11/01/24 1537

## 2024-11-01 NOTE — ED Provider Notes (Signed)
 Claudia Cantrell is a 78 y.o. female with PMH of CAD, CKD, paroxysmal atrial fibrillation presenting for chief complaint of shortness of breath, chest pain, and left arm tingling that started today while she was unloading heavy groceries from her car.  She has been feeling fatigued with left arm tingling for the last couple of days.  She additionally reports increased shortness of breath with walking short distances for the last few days.    Symptoms worsened when she was unloading groceries from her car when she became increasingly short of breath and with central chest discomfort for a few minutes.  She additionally states that she felt like she was going to pass out when she was experiencing chest discomfort and shortness of breath.  Symptoms resolved after she sat down for a few minutes and relaxed.   EKG shows normal sinus rhythm without ST/T wave changes, nonischemic. Overall well appearing with hemodynamically stable vital signs. Lungs clear, heart rate regular.   Recommend more complete workup in the emergency department setting to rule out acute cardiopulmonary abnormality with troponins due to increased risk of ACS and comorbidities.  Discussed clinical concerns/exam findings leading to recommendation for further workup in the ER setting and risks of deferring ER visit with patient/family. Patient/family express understanding and agreement with plan, discharged to ER via private car.     Enedelia Dorna HERO, [place] 11/01/24 1425

## 2024-11-01 NOTE — Discharge Instructions (Signed)
 I discussed the plan for discharge with the patient and/or their surrogate at bedside prior to discharge and they were in agreement with the plan and verbalized understanding of the return precautions provided. All questions answered to the best of my ability. Ultimately, the patient was discharged in stable condition with stable vital signs. I am reassured that they are capable of close follow up and good social support at home.   Please continue to take Tikosyn  I would recommend calling a fib who you follow post ablation. Overall your workup today is reassuring, I do not think you had a heart attack but you may have gone quickly into a fib once again for a little bit.

## 2024-11-01 NOTE — ED Provider Notes (Incomplete)
 Patient is a 78 year old female presenting today after an episode of chest tightness that started when she was loading her groceries in the car that caused some discomfort to radiate down her left arm.  Also during this time she had some shortness of breath.  Then she reports all of this suddenly improved after a few hours.  She has been compliant with her Tikosyn  and her anticoagulation.  She reports now she feels fine.  Question whether this is ACS versus an episode of A-fib that is now resolved.  Lower suspicion for electrolyte abnormality or anemia.  Patient is well-appearing on exam.  Her EKG is reassuring and in a sinus rhythm at this time.  Troponins have been normal.  At this time feel that is reasonable that patient be discharged home to follow-up with A-fib clinic.  She and her family are comfortable with this plan.

## 2024-11-01 NOTE — ED Provider Notes (Signed)
 [place] EMERGENCY DEPARTMENT AT [facility] Provider Note   CSN: [id number] Arrival date & time: 11/01/24  1425     Patient presents with: Shortness of Breath and Chest Pain   Claudia Cantrell is a 78 y.o. female. Hx of CAD, CKD, paroxysmal atrial fibrillation s/p ablation x2 currently on eliquis  and in NSR presenting for chief complaint of shortness of breath, chest pain, and left arm tingling that started today while she was unloading heavy groceries from her car, however has also been oingoing for several days reportedly. Increasing SOB w/ walking short distances the past several days. Seen by UC earlier, nonischemic EKG, overall reassuring eval. Sent to ED to r/o ACS.  On our discussion today, endorses left arm pain has been ongoing for 2 weeks, however significantly exacerbated with numbness and tingling after lifting heavy groceries today, with the chest tightness and the shortness of breath that also developed.  She denies radiating symptoms to the back.  She denies nausea, vomiting, fever, chills, recent illnesses.  Echo 2022: LVEF 55-60%   HPI     Prior to Admission medications   Medication Sig Start Date End Date Taking? Authorizing Provider  acetaminophen  (TYLENOL ) 500 MG tablet Take 1,000 mg by mouth every 6 (six) hours as needed for moderate pain.    [provider]  allopurinol  (ZYLOPRIM ) 100 MG tablet Take 1 tablet (100 mg total) by mouth daily. 09/17/22   Cheryl Waddell HERO, PA-C  amLODipine  (NORVASC ) 5 MG tablet Take 1.5 tablets (7.5 mg total) by mouth daily. 08/19/24   Fenton, Clint R, PA  apixaban  (ELIQUIS ) 5 MG TABS tablet Take 1 tablet by mouth twice daily 06/08/24   Burnard Debby LABOR, MD  cyanocobalamin  (VITAMIN B12) 1000 MCG tablet Take 1,000 mcg by mouth daily.    [provider]  dapagliflozin propanediol (FARXIGA) 10 MG TABS tablet Take 10 mg by mouth in the morning. 01/13/21   [provider]  dofetilide  (TIKOSYN ) 250 MCG capsule Take  1 capsule (250 mcg total) by mouth 2 (two) times daily. 05/28/24   Fenton, Clint R, PA  ezetimibe  (ZETIA ) 10 MG tablet Take 1 tablet (10 mg total) by mouth daily. 04/10/24 09/02/24  Burnard Debby LABOR, MD  furosemide  (LASIX ) 20 MG tablet Take 20 mg by mouth daily as needed for edema. 10/31/21   [provider]  isosorbide  dinitrate (ISORDIL ) 5 MG tablet Take 1 tablet by mouth twice daily 10/17/23   Camnitz, Soyla Lunger, MD  loratadine  (CLARITIN ) 10 MG tablet Take 1 tablet (10 mg total) by mouth daily. 03/18/24   Henson, Vickie L, NP-C  Multiple Vitamins-Minerals (OCUVITE ADULT 50+ PO) Take 1 tablet by mouth daily. Patient taking differently: Take 1 tablet by mouth as needed.    [provider]  olmesartan  (BENICAR ) 40 MG tablet Take 1 tablet (40 mg total) by mouth daily. 04/07/24   Camnitz, Soyla Lunger, MD  rosuvastatin  (CRESTOR ) 40 MG tablet Take 1 tablet by mouth once daily 05/27/24   Burnard Debby LABOR, MD  semaglutide -weight management (WEGOVY ) 0.25 MG/0.5ML SOAJ SQ injection Inject 0.25 mg into the skin once a week. 09/02/24   Shamleffer, Ibtehal Jaralla, MD  sodium bicarbonate 650 MG tablet Take 650 mg by mouth 2 (two) times daily. 08/19/24   [provider]  SYNTHROID  100 MCG tablet Take 1 tablet (100 mcg total) by mouth daily before breakfast. 09/03/24   Shamleffer, Ibtehal Jaralla, MD    Allergies: Labetalol and Codeine    Review of  Systems  Updated Vital Signs BP (!) 168/86   Pulse 68   Temp 98 F (36.7 C) (Oral)   Resp 17   Ht 5' 5 (1.651 m)   Wt 92.5 kg   SpO2 100%   BMI 33.93 kg/m   Physical Exam Vitals and nursing note reviewed.  Constitutional:      General: She is not in acute distress.    Appearance: She is well-developed.  HENT:     Head: Normocephalic and atraumatic.  Eyes:     Conjunctiva/sclera: Conjunctivae normal.  Neck:     Vascular: JVD present.     Comments: Small amount of JVD extending approximately to 8 cm below the  earlobe. Cardiovascular:     Rate and Rhythm: Normal rate and regular rhythm.     Heart sounds: No murmur heard. Pulmonary:     Effort: Pulmonary effort is normal. No respiratory distress.     Breath sounds: Normal breath sounds.  Abdominal:     Palpations: Abdomen is soft.     Tenderness: There is no abdominal tenderness.  Musculoskeletal:        General: No swelling.     Cervical back: Neck supple.     Right lower leg: Edema present.     Left lower leg: Edema present.     Comments: 2+ pitting edema bilateral lower extremities.  Skin:    General: Skin is warm and dry.     Capillary Refill: Capillary refill takes less than 2 seconds.  Neurological:     Mental Status: She is alert.  Psychiatric:        Mood and Affect: Mood normal.     (all labs ordered are listed, but only abnormal results are displayed) Labs Reviewed  CBC - Abnormal; Notable for the following components:      Result Value   RBC 5.27 (*)    Hemoglobin 15.8 (*)    HCT 50.0 (*)    All other components within normal limits  I-STAT CHEM 8, ED - Abnormal; Notable for the following components:   Hemoglobin 17.3 (*)    HCT 51.0 (*)    All other components within normal limits  BASIC METABOLIC PANEL WITH GFR  BRAIN NATRIURETIC PEPTIDE  TROPONIN I (HIGH SENSITIVITY)  TROPONIN I (HIGH SENSITIVITY)    EKG: EKG Interpretation Date/Time:  Sunday November 01 2024 18:12:58 EST Ventricular Rate:  67 PR Interval:  159 QRS Duration:  104 QT Interval:  425 QTC Calculation: 449 R Axis:   11  Text Interpretation: Sinus rhythm Consider anterior infarct Minimal ST elevation, inferior leads No significant change since last tracing Confirmed by Doretha Folks ([id number]) on 11/01/2024 6:52:20 PM  Radiology: [facility] Chest 2 View Result Date: 11/01/2024 CLINICAL DATA:  chest pain EXAM: CHEST - 2 VIEW COMPARISON:  Apr 26, 2024, September 21, 2019 FINDINGS: The cardiomediastinal silhouette is unchanged and enlarged in  contour.Surgical clips project over the neck. No pleural effusion. No pneumothorax. No acute pleuroparenchymal abnormality. Visualized abdomen is unremarkable. Multilevel degenerative changes of the thoracic spine. IMPRESSION: No acute cardiopulmonary abnormality.  Cardiomegaly. Electronically Signed   By: Corean Salter M.D.   On: 11/01/2024 16:33     Procedures   Medications Ordered in the ED - No data to display  Clinical Course as of 11/02/24 0009  Sun Nov 01, 2024  1851 Compliant w/ tikasin [BS]    Clinical Course User Index [BS] Arlee Katz, MD  Medical Decision Making Amount and/or Complexity of Data Reviewed Labs: ordered. Radiology: ordered.   Based on patient presentation, history, evaluation, high suspicion for unspecified chest pain, however overall very stable.  By time of patient presentation, chest pain is completely resolved.  Patient overall is very well-appearing, repeat troponin is negative.  Patient is on Tikosyn , and has been compliant with medication.  Has a history of A-fib status post ablation x 2.  Patient may have had an episode where she went into A-fib periodically, which then quickly resolved.  This may have been patient's symptoms of chest tightening, but very quickly resolved immediately after the episode was started.  Overall with very reassuring workup, I have very low suspicion for ACS versus pneumonia versus pneumonitis versus pneumothorax versus aortic dissection versus PE per physical exam, and history.  Overall, patient is stable for discharge at this time.  Recommend follow-up with PCP in 3 to 4 days.     Final diagnoses:  Chest pain, unspecified type  SOB (shortness of breath)    ED Discharge Orders     None          Arlee Katz, MD 11/02/24 VELVET    Doretha Folks, MD 11/02/24 2119

## 2024-11-01 NOTE — ED Triage Notes (Signed)
 Pt sent here from UC for cardiac work up. Pt had an episode of chest tightness today with shob and near syncope.

## 2024-11-01 NOTE — ED Triage Notes (Signed)
 Patient states that UC sent her down for labs. She was seen there for shob and chest tightness.  She has had shob since this morning

## 2024-11-01 NOTE — ED Triage Notes (Signed)
 Pt reports this morning had SOB and tightness of chest and tingling in left fingers. Was removing things out of car and bringing to house when symptoms came on.

## 2024-11-02 ENCOUNTER — Other Ambulatory Visit

## 2024-11-17 NOTE — Progress Notes (Unsigned)
  Electrophysiology Office Note:   Date:  11/17/2024  ID:  Claudia Cantrell, DOB [birth year], MRN [id number]  Primary Cardiologist: None Primary Heart Failure: None Electrophysiologist: Furman Trentman Gladis Norton, MD  {Click to update primary MD,subspecialty MD or APP then REFRESH:1}    History of Present Illness:   Claudia Cantrell is a 78 y.o. female with h/o atrial fibrillation, coronary artery disease, sleep apnea, hypertension seen today for routine electrophysiology followup.   Since last being seen in our clinic the patient reports doing ***.  she denies chest pain, palpitations, dyspnea, PND, orthopnea, nausea, vomiting, dizziness, syncope, edema, weight gain, or early satiety.   Review of systems complete and found to be negative unless listed in HPI.   EP Information / Studies Reviewed:    {EKGtoday:28818}        Risk Assessment/Calculations:    CHA2DS2-VASc Score = 7  {Confirm score is correct.  If not, click here to update score.  REFRESH note.  :1} This indicates a 11.2% annual risk of stroke. The patient's score is based upon: CHF History: 1 HTN History: 1 Diabetes History: 1 Stroke History: 0 Vascular Disease History: 1 Age Score: 2 Gender Score: 1   {This patient has a significant risk of stroke if diagnosed with atrial fibrillation.  Please consider VKA or DOAC agent for anticoagulation if the bleeding risk is acceptable.   You can also use the SmartPhrase .HCCHADSVASC for documentation.   :[id number]}         Physical Exam:   VS:  There were no vitals taken for this visit.   Wt Readings from Last 3 Encounters:  11/01/24 203 lb 14.8 oz (92.5 kg)  09/02/24 204 lb (92.5 kg)  08/31/24 205 lb 12.8 oz (93.4 kg)     GEN: Well nourished, well developed in no acute distress NECK: No JVD; No carotid bruits CARDIAC: {EPRHYTHM:28826}, no murmurs, rubs, gallops RESPIRATORY:  Clear to auscultation without rales, wheezing or rhonchi  ABDOMEN: Soft, non-tender,  non-distended EXTREMITIES:  No edema; No deformity   ASSESSMENT AND PLAN:    1.  Persistent atrial fibrillation: Post ablation 07/28/2021 with repeat ablation 12/14/2022.  It is now post dofetilide  load.***  2.  Secondary hypercoagulable date: On Eliquis   3.  Coronary artery disease: No current chest pain.  Plan per primary cardiology  4.  Obstructive sleep apnea: CPAP compliance encouraged  Follow up with {EPMDS:28135::EP Team} {EPFOLLOW LE:71826}  Signed, Tayana Shankle Gladis Norton, MD

## 2024-11-18 ENCOUNTER — Encounter: Payer: Self-pay | Admitting: Cardiology

## 2024-11-18 ENCOUNTER — Ambulatory Visit: Attending: Cardiology | Admitting: Cardiology

## 2024-11-18 VITALS — BP 144/80 | HR 65 | Ht 65.0 in | Wt 202.0 lb

## 2024-11-18 DIAGNOSIS — I4819 Other persistent atrial fibrillation: Secondary | ICD-10-CM | POA: Diagnosis not present

## 2024-11-18 DIAGNOSIS — I2510 Atherosclerotic heart disease of native coronary artery without angina pectoris: Secondary | ICD-10-CM

## 2024-11-18 DIAGNOSIS — G4733 Obstructive sleep apnea (adult) (pediatric): Secondary | ICD-10-CM

## 2024-11-18 DIAGNOSIS — Z01812 Encounter for preprocedural laboratory examination: Secondary | ICD-10-CM

## 2024-11-18 DIAGNOSIS — D6869 Other thrombophilia: Secondary | ICD-10-CM

## 2024-11-18 NOTE — Patient Instructions (Signed)
 Medication Instructions:  Your physician recommends that you continue on your current medications as directed. Please refer to the Current Medication list given to you today.  *If you need a refill on your cardiac medications before your next appointment, please call your pharmacy*   Lab Work: Pre procedure labs -- we will call you to schedule:  BMP & CBC  If you have a lab test that is abnormal and we need to change your treatment, we will call you to review the results -- otherwise no news is good news.    Testing/Procedures: Your physician has requested that you have cardiac CT 3 weeks PRIOR to your ablation. Cardiac computed tomography (CT) is a painless test that uses an x-ray machine to take clear, detailed pictures of your heart. We will contact you if the result is abnormal. We will call you to schedule.  Your physician has recommended that you have an ablation. Catheter ablation is a medical procedure used to treat some cardiac arrhythmias (irregular heartbeats). During catheter ablation, a long, thin, flexible tube is put into a blood vessel in your groin (upper thigh), or neck. This tube is called an ablation catheter. It is then guided to your heart through the blood vessel. Radio frequency waves destroy small areas of heart tissue where abnormal heartbeats may cause an arrhythmia to start. Please review the information below on ablation and after care.  Your ablation is scheduled for 01/27/2025. Please arrive at [facility] at 6:30 am.  We will call/send instructions at a later date.   Follow-Up: At [facility], you and your health needs are our priority.  As part of our continuing mission to provide you with exceptional heart care, we have created designated Provider Care Teams.  These Care Teams include your primary Cardiologist (physician) and Advanced Practice Providers (APPs -  Physician Assistants and Nurse Practitioners) who all work together to provide you with the  care you need, when you need it.  Your next appointment:   1 month(s) after your ablation  The format for your next appointment:   In Person  Provider:   AFib clinic   Thank you for choosing [facility]!!   Maeola Domino, RN [phone]    Other Instructions   Cardiac Ablation   (Pulsed Field Ablation) Cardiac ablation is a procedure to destroy (ablate) some heart tissue that is sending bad signals. These bad signals cause problems in heart rhythm. The heart has many areas that make these signals. If there are problems in these areas, they can make the heart beat in a way that is not normal. Destroying some tissues can help make the heart rhythm normal. Tell your doctor about: Any allergies you have. All medicines you are taking. These include vitamins, herbs, eye drops, creams, and over-the-counter medicines. Any problems you or family members have had with medicines that make you fall asleep (anesthetics). Any blood disorders you have. Any surgeries you have had. Any medical conditions you have, such as kidney failure. Whether you are pregnant or may be pregnant. What are the risks? This is a safe procedure. But problems may occur, including: Infection. Bruising and bleeding. Bleeding into the chest. Stroke or blood clots. Damage to nearby areas of your body. Allergies to medicines or dyes. The need for a pacemaker if the normal system is damaged. Failure of the procedure to treat the problem. What happens before the procedure? Medicines Ask your doctor about: Changing or stopping your normal medicines. This is important.  Taking aspirin  and ibuprofen . Do not take these medicines unless your doctor tells you to take them. Taking other medicines, vitamins, herbs, and supplements. General instructions Follow instructions from your doctor about what you cannot eat or drink. Plan to have someone take you home from the hospital or clinic. If you will be going  home right after the procedure, plan to have someone with you for 24 hours. Ask your doctor what steps will be taken to prevent infection. What happens during the procedure?  An IV tube will be put into one of your veins. You will be given a medicine to help you relax. The skin on your neck or groin will be numbed. A cut (incision) will be made in your neck or groin. A needle will be put through your cut and into a large vein. A tube (catheter) will be put into the needle. The tube will be moved to your heart. Dye may be put through the tube. This helps your doctor see your heart. Small devices (electrodes) on the tube will send out signals. A type of energy will be used to destroy some heart tissue. The tube will be taken out. Pressure will be held on your cut. This helps stop bleeding. A bandage will be put over your cut. The exact procedure may vary among doctors and hospitals. What happens after the procedure? You will be watched until you leave the hospital or clinic. This includes checking your heart rate, breathing rate, oxygen, and blood pressure. Your cut will be watched for bleeding. You will need to lie still for a few hours. Do not drive for 24 hours or as long as your doctor tells you. Summary Cardiac ablation is a procedure to destroy some heart tissue. This is done to treat heart rhythm problems. Tell your doctor about any medical conditions you may have. Tell him or her about all medicines you are taking to treat them. This is a safe procedure. But problems may occur. These include infection, bruising, bleeding, and damage to nearby areas of your body. Follow what your doctor tells you about food and drink. You may also be told to change or stop some of your medicines. After the procedure, do not drive for 24 hours or as long as your doctor tells you. This information is not intended to replace advice given to you by your health care provider. Make sure you discuss any  questions you have with your health care provider. Document Revised: 02/16/2022 Document Reviewed: 10/29/2019 Elsevier Patient Education  2023 Elsevier Inc.   Cardiac Ablation, Care After  This sheet gives you information about how to care for yourself after your procedure. Your health care provider may also give you more specific instructions. If you have problems or questions, contact your health care provider. What can I expect after the procedure? After the procedure, it is common to have: Bruising around your puncture site. Tenderness around your puncture site. Skipped heartbeats. If you had an atrial fibrillation ablation, you may have atrial fibrillation during the first several months after your procedure.  Tiredness (fatigue).  Follow these instructions at home: Puncture site care  Follow instructions from your health care provider about how to take care of your puncture site. Make sure you: If present, leave stitches (sutures), skin glue, or adhesive strips in place. These skin closures may need to stay in place for up to 2 weeks. If adhesive strip edges start to loosen and curl up, you may trim the loose edges. Do  not remove adhesive strips completely unless your health care provider tells you to do that. If a large square bandage is present, this may be removed 24 hours after surgery.  Check your puncture site every day for signs of infection. Check for: Redness, swelling, or pain. Fluid or blood. If your puncture site starts to bleed, lie down on your back, apply firm pressure to the area, and contact your health care provider. Warmth. Pus or a bad smell. A pea or marble sized lump/knot at the site is normal and can take up to three months to resolve.  Driving Do not drive for at least 4 days after your procedure or however long your health care provider recommends. (Do not resume driving if you have previously been instructed not to drive for other health reasons.) Do not  drive or use heavy machinery while taking prescription pain medicine. Activity Avoid activities that take a lot of effort for at least 7 days after your procedure. Do not lift anything that is heavier than 5 lb (4.5 kg) for one week.  No sexual activity for 1 week.  Return to your normal activities as told by your health care provider. Ask your health care provider what activities are safe for you. General instructions Take over-the-counter and prescription medicines only as told by your health care provider. Do not use any products that contain nicotine or tobacco, such as cigarettes and e-cigarettes. If you need help quitting, ask your health care provider. You may shower after 24 hours, but Do not take baths, swim, or use a hot tub for 1 week.  Do not drink alcohol for 24 hours after your procedure. Keep all follow-up visits as told by your health care provider. This is important. Contact a health care provider if: You have redness, mild swelling, or pain around your puncture site. You have fluid or blood coming from your puncture site that stops after applying firm pressure to the area. Your puncture site feels warm to the touch. You have pus or a bad smell coming from your puncture site. You have a fever. You have chest pain or discomfort that spreads to your neck, jaw, or arm. You have chest pain that is worse with lying on your back or taking a deep breath. You are sweating a lot. You feel nauseous. You have a fast or irregular heartbeat. You have shortness of breath. You are dizzy or light-headed and feel the need to lie down. You have pain or numbness in the arm or leg closest to your puncture site. Get help right away if: Your puncture site suddenly swells. Your puncture site is bleeding and the bleeding does not stop after applying firm pressure to the area. These symptoms may represent a serious problem that is an emergency. Do not wait to see if the symptoms will go away.  Get medical help right away. Call your local emergency services (911 in the [place] Do not drive yourself to the hospital. Summary After the procedure, it is normal to have bruising and tenderness at the puncture site in your groin, neck, or forearm. Check your puncture site every day for signs of infection. Get help right away if your puncture site is bleeding and the bleeding does not stop after applying firm pressure to the area. This is a medical emergency. This information is not intended to replace advice given to you by your health care provider. Make sure you discuss any questions you have with your health care provider.

## 2024-11-20 ENCOUNTER — Other Ambulatory Visit: Payer: Self-pay | Admitting: Cardiology

## 2024-11-25 ENCOUNTER — Telehealth: Payer: Self-pay | Admitting: Cardiology

## 2024-11-25 DIAGNOSIS — I4819 Other persistent atrial fibrillation: Secondary | ICD-10-CM

## 2024-11-25 MED ORDER — APIXABAN 5 MG PO TABS: 5.0000 mg | ORAL_TABLET | Freq: Two times a day (BID) | ORAL | 1 refills | Status: AC

## 2024-11-25 NOTE — Telephone Encounter (Signed)
 Prescription refill request for Eliquis  received. Indication: AF Last office visit: 11/18/24  LELON Norton MD Scr: 0.90 on 11/01/24  Epic Age: 78 Weight: 91.6kg  Based on above findings Eliquis  5mg  twice daily is the appropriate dose.  Refill approved.

## 2024-11-25 NOTE — Telephone Encounter (Signed)
°*  STAT* If patient is at the pharmacy, call can be transferred to refill team.   1. Which medications need to be refilled? (please list name of each medication and dose if known) apixaban  (ELIQUIS ) 5 MG TABS tablet    2. Would you like to learn more about the convenience, safety, & potential cost savings by using the [facility]? No    3. Are you open to using the [facility] (Type [facility].  ). No    4. Which pharmacy/location (including street and city if local pharmacy) is medication to be sent to? [facility] [address] [place], [place] - [address]     5. Do they need a 30 day or 90 day supply? 90 day    Pt is out of medication

## 2024-12-01 ENCOUNTER — Other Ambulatory Visit (HOSPITAL_COMMUNITY): Payer: Self-pay | Admitting: Physician Assistant

## 2024-12-04 ENCOUNTER — Telehealth: Payer: Self-pay

## 2024-12-04 NOTE — Telephone Encounter (Signed)
 Claudia Cantrell

## 2024-12-11 ENCOUNTER — Ambulatory Visit: Admitting: Cardiology

## 2024-12-16 ENCOUNTER — Other Ambulatory Visit (HOSPITAL_COMMUNITY): Payer: Self-pay

## 2024-12-16 ENCOUNTER — Telehealth: Payer: Self-pay

## 2024-12-16 NOTE — Telephone Encounter (Signed)
 Pharmacy Patient Advocate Encounter   Received notification from Onbase CMM KEY that prior authorization for Wegovy  0.25MG /0.5ML auto-injectors is required/requested.   Insurance verification completed.   The patient is insured through [facility] [employer].   Per test claim: PA required; PA submitted to above mentioned insurance via Latent Key/confirmation #/EOC [id number] Status is pending

## 2024-12-29 ENCOUNTER — Telehealth: Payer: Self-pay

## 2024-12-29 ENCOUNTER — Telehealth (HOSPITAL_COMMUNITY): Payer: Self-pay

## 2024-12-29 NOTE — Telephone Encounter (Signed)
-----   Message from Nurse Doreatha BROCKS, RN sent at 11/26/2024  9:38 AM EST ----- Regarding: 01/27/25 afib ablation Precert:  MD: Camnitz Type of ablation: A-fib Diagnosis: afib CPT code: A-fib (06343) Ablation scheduled (date/time): 2/18 at 8:30am  Procedure:  Added to calendar? Yes Orders entered? Yes Letter complete? No, >30 days before procedure Scheduled with cath lab? Yes Any medications to hold? Yes (please list hold instructions): farxiga, samglutide Labs ordered (CBC, BMET, PT/INR if on warfarin): Yes Mapping system: Doesn't matter CARTO/OPAL rep notified? No Cardiac CT needed? Yes, ordered Dye allergy? No Pre-meds ordered and instructions given? No, >30 days before procedure Letter method: MyChart H&P: 12/10 Device: No  Follow-up:  Cassie/Angel, please schedule Routine.  Covering RN - please send this message to [employer], EP scheduler, EP Scheduling pool, EP [employer], and CT scheduler (Brittany Lynch/Stephanie Mogg), if indicated.

## 2024-12-29 NOTE — Telephone Encounter (Signed)
 Instruction letters mailed to patient.... ag

## 2024-12-29 NOTE — Telephone Encounter (Signed)
 Spoke with patient to complete pre-procedure call.     Health status review:  Any new medical conditions, recent signs of acute illness or been started on antibiotics? No Any recent hospitalizations or surgeries? No Any new medications started since pre-op  visit? No  Follow all medication instructions prior to procedure or the procedure may be rescheduled:    Continue taking Eliquis  (Apixaban ) twice daily without missing any doses before procedure. Essential chronic medications:  No medication should be continued, unless told otherwise.  HOLD: Semaglutide  (Ozempic , Rybelsus, Wegovy ) for 1 week prior to the procedure. Last dose on Friday, February 6. Patient reports she no longer takes Farxiga as discontinued by renal doctor.  On the morning of your procedure DO NOT take any medication., including Eliquis  (Apixaban ).  Nothing to eat or drink after midnight prior to your procedure.  Pre-procedure testing scheduled: CT and lab work on January 28.  Confirmed patient is scheduled for Atrial Fibrillation Ablation on Wednesday, February 18 with Dr. Inocencio. Instructed patient to arrive at the Main Entrance A at [facility]: [address] [place], [place] [address] and check in at Admitting at 6:30 AM.  Plan to go home the same day, you will only stay overnight if medically necessary. You MUST have a responsible adult to drive you home and MUST be with you the first 24 hours after you arrive home or your procedure could be cancelled.  Informed a nurse may call a day before the procedure to confirm arrival time and ensure instructions are followed.  Patient verbalized understanding to information provided and is agreeable to proceed with procedure.   Advised to contact RN Navigator at [phone], to inform of any new medications started after call or concerns prior to procedure.

## 2025-01-06 ENCOUNTER — Other Ambulatory Visit: Payer: Self-pay

## 2025-01-06 ENCOUNTER — Ambulatory Visit (HOSPITAL_COMMUNITY)
Admission: RE | Admit: 2025-01-06 | Discharge: 2025-01-06 | Disposition: A | Discharge status: Home / Self Care | Source: Ambulatory Visit | Attending: Cardiology | Admitting: Cardiology

## 2025-01-06 VITALS — BP 166/97 | HR 60

## 2025-01-06 DIAGNOSIS — I4819 Other persistent atrial fibrillation: Secondary | ICD-10-CM

## 2025-01-06 DIAGNOSIS — Z01812 Encounter for preprocedural laboratory examination: Secondary | ICD-10-CM

## 2025-01-06 DIAGNOSIS — N2889 Other specified disorders of kidney and ureter: Secondary | ICD-10-CM | POA: Diagnosis present

## 2025-01-06 LAB — POCT I-STAT CREATININE: Creatinine, Ser: 1 mg/dL (ref 0.44–1.00)

## 2025-01-06 MED ORDER — IOHEXOL 350 MG/ML SOLN
80.0000 mL | Freq: Once | INTRAVENOUS | Status: AC | PRN
  Administered 2025-01-06: 80 mL via INTRAVENOUS

## 2025-01-07 LAB — CBC
Hematocrit: 48.7 % — ABNORMAL HIGH (ref 34.0–46.6)
Hemoglobin: 15.5 g/dL (ref 11.1–15.9)
MCH: 30.4 pg (ref 26.6–33.0)
MCHC: 31.8 g/dL (ref 31.5–35.7)
MCV: 96 fL (ref 79–97)
Platelets: 196 10*3/uL (ref 150–450)
RBC: 5.1 x10E6/uL (ref 3.77–5.28)
RDW: 14.6 % (ref 11.7–15.4)
WBC: 4.3 10*3/uL (ref 3.4–10.8)

## 2025-01-07 LAB — BASIC METABOLIC PANEL WITH GFR
BUN/Creatinine Ratio: 15 (ref 12–28)
BUN: 14 mg/dL (ref 8–27)
CO2: 22 mmol/L (ref 20–29)
Calcium: 9.9 mg/dL (ref 8.7–10.3)
Chloride: 102 mmol/L (ref 96–106)
Creatinine, Ser: 0.94 mg/dL (ref 0.57–1.00)
Glucose: 76 mg/dL (ref 70–99)
Potassium: 4.5 mmol/L (ref 3.5–5.2)
Sodium: 141 mmol/L (ref 134–144)
eGFR: 63 mL/min/{1.73_m2}

## 2025-01-27 ENCOUNTER — Encounter (HOSPITAL_COMMUNITY): Payer: Self-pay

## 2025-01-27 ENCOUNTER — Ambulatory Visit (HOSPITAL_COMMUNITY): Admit: 2025-01-27 | Admitting: Cardiology

## 2025-01-27 SURGERY — ATRIAL FIBRILLATION ABLATION
Anesthesia: General

## 2025-03-01 ENCOUNTER — Ambulatory Visit: Admitting: Physician Assistant

## 2025-03-02 ENCOUNTER — Ambulatory Visit: Admitting: Internal Medicine

## 2025-05-06 ENCOUNTER — Ambulatory Visit
